# Patient Record
Sex: Female | Born: 1960 | Race: White | Hispanic: No | Marital: Married | Smoking: Current every day smoker
Health system: Southern US, Community
[De-identification: ages and names within clinical notes are randomized; demographics above are authoritative.]

## PROBLEM LIST (undated history)

## (undated) DIAGNOSIS — M549 Dorsalgia, unspecified: Secondary | ICD-10-CM

## (undated) DIAGNOSIS — C50919 Malignant neoplasm of unspecified site of unspecified female breast: Secondary | ICD-10-CM

## (undated) DIAGNOSIS — Z9889 Other specified postprocedural states: Secondary | ICD-10-CM

## (undated) DIAGNOSIS — F419 Anxiety disorder, unspecified: Secondary | ICD-10-CM

## (undated) DIAGNOSIS — J45909 Unspecified asthma, uncomplicated: Secondary | ICD-10-CM

## (undated) DIAGNOSIS — F4310 Post-traumatic stress disorder, unspecified: Secondary | ICD-10-CM

## (undated) DIAGNOSIS — Z923 Personal history of irradiation: Secondary | ICD-10-CM

## (undated) DIAGNOSIS — F329 Major depressive disorder, single episode, unspecified: Secondary | ICD-10-CM

## (undated) DIAGNOSIS — F32A Depression, unspecified: Secondary | ICD-10-CM

## (undated) DIAGNOSIS — G8929 Other chronic pain: Secondary | ICD-10-CM

## (undated) DIAGNOSIS — J449 Chronic obstructive pulmonary disease, unspecified: Secondary | ICD-10-CM

## (undated) HISTORY — PX: BREAST BIOPSY: SHX20

## (undated) HISTORY — PX: TUBAL LIGATION: SHX77

## (undated) HISTORY — PX: MASTECTOMY: SHX3

## (undated) HISTORY — PX: NEPHRECTOMY: SHX65

---
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
## 2002-11-10 DIAGNOSIS — C50919 Malignant neoplasm of unspecified site of unspecified female breast: Secondary | ICD-10-CM

## 2002-11-10 HISTORY — DX: Malignant neoplasm of unspecified site of unspecified female breast: C50.919

## 2002-11-10 HISTORY — PX: BREAST LUMPECTOMY: SHX2

## 2002-11-10 HISTORY — PX: BREAST BIOPSY: SHX20

## 2018-06-09 ENCOUNTER — Encounter: Payer: Self-pay | Admitting: Emergency Medicine

## 2018-06-09 ENCOUNTER — Other Ambulatory Visit: Payer: Self-pay

## 2018-06-09 ENCOUNTER — Emergency Department
Admission: EM | Admit: 2018-06-09 | Discharge: 2018-06-09 | Disposition: A | Discharge status: Home / Self Care | Payer: 59 | Attending: Emergency Medicine | Admitting: Emergency Medicine

## 2018-06-09 DIAGNOSIS — R21 Rash and other nonspecific skin eruption: Secondary | ICD-10-CM | POA: Diagnosis present

## 2018-06-09 DIAGNOSIS — J45909 Unspecified asthma, uncomplicated: Secondary | ICD-10-CM | POA: Insufficient documentation

## 2018-06-09 DIAGNOSIS — F17200 Nicotine dependence, unspecified, uncomplicated: Secondary | ICD-10-CM | POA: Insufficient documentation

## 2018-06-09 DIAGNOSIS — T7840XA Allergy, unspecified, initial encounter: Secondary | ICD-10-CM

## 2018-06-09 HISTORY — DX: Unspecified asthma, uncomplicated: J45.909

## 2018-06-09 MED ORDER — PREDNISONE 10 MG PO TABS: ORAL_TABLET | ORAL | 0 refills | Status: DC

## 2018-06-09 MED ORDER — EPINEPHRINE 0.3 MG/0.3ML IJ SOAJ
INTRAMUSCULAR | Status: AC
  Filled 2018-06-09: qty 0.3

## 2018-06-09 MED ORDER — FAMOTIDINE IN NACL 20-0.9 MG/50ML-% IV SOLN
20.0000 mg | Freq: Once | INTRAVENOUS | Status: AC
  Administered 2018-06-09: 20 mg via INTRAVENOUS

## 2018-06-09 MED ORDER — HYDROXYZINE HCL 25 MG PO TABS: 25.0000 mg | ORAL_TABLET | Freq: Three times a day (TID) | ORAL | 0 refills | Status: DC | PRN

## 2018-06-09 MED ORDER — METHYLPREDNISOLONE SODIUM SUCC 125 MG IJ SOLR
INTRAMUSCULAR | Status: AC
  Filled 2018-06-09: qty 2

## 2018-06-09 MED ORDER — EPINEPHRINE 0.3 MG/0.3ML IJ SOAJ
0.3000 mg | Freq: Once | INTRAMUSCULAR | Status: AC
  Administered 2018-06-09: 0.3 mg via INTRAMUSCULAR

## 2018-06-09 MED ORDER — EPINEPHRINE 0.3 MG/0.3ML IJ SOAJ: 0.3000 mg | Freq: Once | INTRAMUSCULAR | 0 refills | Status: AC

## 2018-06-09 MED ORDER — METHYLPREDNISOLONE SODIUM SUCC 125 MG IJ SOLR
125.0000 mg | Freq: Once | INTRAMUSCULAR | Status: AC
  Administered 2018-06-09: 125 mg via INTRAVENOUS

## 2018-06-09 MED ORDER — HYDROXYZINE HCL 25 MG PO TABS
ORAL_TABLET | ORAL | Status: AC
  Filled 2018-06-09: qty 1

## 2018-06-09 MED ORDER — HYDROXYZINE HCL 25 MG PO TABS
25.0000 mg | ORAL_TABLET | Freq: Once | ORAL | Status: AC
  Administered 2018-06-09: 25 mg via ORAL

## 2018-06-09 MED ORDER — FAMOTIDINE IN NACL 20-0.9 MG/50ML-% IV SOLN
INTRAVENOUS | Status: AC
  Filled 2018-06-09: qty 50

## 2018-06-09 NOTE — ED Provider Notes (Signed)
[facility] Emergency Department Provider Note  ____________________________________________   First MD Initiated Contact with Patient 06/09/18 0410     (approximate)  I have reviewed the triage vital signs and the nursing notes.   HISTORY  Chief Complaint Rash    HPI Colleen [name] is a 57 y.o. female with a history of seasonal and environmental allergies who presents for a worsening generalized pruritic rash that is been present for about 6 weeks.  She describes it as hives but it never goes away.  She seems to be getting insect bites, as does her husband, but for her it develops into a generalized rash over her entire body including her neck and face.  She has no trouble breathing although she does have some chronic asthma, at least until tonight when she did feel short of breath.  She said that the itching is so severe she cannot stand it and she feels like she is having trouble breathing and does not know what to do.  She has been trying Benadryl at home but is not been helping.  The symptoms got acutely worse over the course of the evening and nothing has been different in her routine; she has not tried any new cleaning products, beauty products, soaps or detergents, new pets, new medications, etc.  She and her husband are very careful and only use fragrance free products as it is due to some seasonal and hypersensitivities of which they are already aware.  They recently moved to [place] from [place] and she says that she sometimes had similar symptoms in [place] but they have been much worse in [place].  She has seen a primary care provider but has not yet seen an allergist or dermatologist.   She denies fever/chills, chest pain, vomiting, and abdominal pain.  She has had some shortness of breath and some nausea.  Overall she describes the symptoms as gradual in onset but acutely worse tonight, severe, nothing is helping.  Past Medical History:    Diagnosis Date  . Asthma     There are no active problems to display for this patient.   Past Surgical History:  Procedure Laterality Date  . MASTECTOMY Left   . NEPHRECTOMY Left   . TUBAL LIGATION      Prior to Admission medications   Medication Sig Start Date End Date Taking? Authorizing Provider  EPINEPHrine (EPIPEN 2-PAK) 0.3 mg/0.3 mL IJ SOAJ injection Inject 0.3 mLs (0.3 mg total) into the muscle once for 1 dose. Take for severe allergic reaction, then come immediately to the Emergency Department or call 911. 06/09/18 06/09/18  Hinda Kehr, MD  hydrOXYzine (ATARAX/VISTARIL) 25 MG tablet Take 1 tablet (25 mg total) by mouth every 8 (eight) hours as needed for itching. 06/09/18   Hinda Kehr, MD  predniSONE (DELTASONE) 10 MG tablet Take 6 tabs (60 mg) PO x 3 days, then take 4 tabs (40 mg) PO x 3 days, then take 2 tabs (20 mg) PO x 3 days, then take 1 tab (10 mg) PO x 3 days, then take 1/2 tab (5 mg) PO x 4 days. 06/09/18   Hinda Kehr, MD    Allergies Morphine and related; Penicillins; Sulfa antibiotics; and Tamoxifen  No family history on file.  Social History Social History   Tobacco Use  . Smoking status: Current Every Day Smoker  . Smokeless tobacco: Current User  Substance Use Topics  . Alcohol use: Not on file  . Drug use: Not on  file    Review of Systems Constitutional: No fever/chills Eyes: No visual changes. ENT: No sore throat. Cardiovascular: Denies chest pain. Respiratory: Mild shortness of breath. Gastrointestinal: No abdominal pain.  Nausea, no vomiting.  No diarrhea.  No constipation. Genitourinary: Negative for dysuria. Musculoskeletal: Negative for neck pain.  Negative for back pain. Integumentary: Acute on chronic generalized pruritic skin rash as described above Neurological: Negative for headaches, focal weakness or numbness.   ____________________________________________   PHYSICAL EXAM:  VITAL SIGNS: ED Triage Vitals [06/09/18  0347]  Enc Vitals Group     BP (!) 113/99     Pulse Rate 90     Resp 18     Temp 97.7 F (36.5 C)     Temp Source Oral     SpO2 94 %     Weight 59 kg (130 lb)     Height 1.524 m (5')     Head Circumference      Peak Flow      Pain Score 0     Pain Loc      Pain Edu?      Excl. in [place]?     Constitutional: Alert and oriented.  Patient is in moderate to severe distress due to the generalized pruritic rash. Eyes: Conjunctivae are normal.  Head: Atraumatic. Nose: No congestion/rhinnorhea. Mouth/Throat: Mucous membranes are moist.  Oropharynx non-erythematous.  No mucosal involvement of rash Neck: No stridor.  No meningeal signs.   Cardiovascular: Normal rate, regular rhythm. Good peripheral circulation. Grossly normal heart sounds. Respiratory: Normal respiratory effort.  No retractions. Lungs CTAB. Gastrointestinal: Soft and nontender. No distention.  Musculoskeletal: No lower extremity tenderness nor edema. No gross deformities of extremities. Neurologic:  Normal speech and language. No gross focal neurologic deficits are appreciated.  Skin:  Skin is warm, dry and intact.  She has a generalized maculopapular rash over most of her body with the exception of palms and soles.  In places it appears almost urticarial but that is mostly on her upper extremities.  She also has multiple areas that do appear to be insect bites.  These lesions are similar to what her husband has but without the generalized reaction.  She has no linear lines of bites to suggest bedbugs, and she has no predominance of bites on her fingers or hands or "crevices" to suggest scabies.  There is no evidence of cellulitis or superimposed skin infection and she has only a few obvious excoriated lesions; in general she says she tries hard not to scratch the rash. Psychiatric: Mood and affect are anxious and upset, almost tearful.   ____________________________________________   LABS (all labs ordered are listed, but only  abnormal results are displayed)  Labs Reviewed - No data to display ____________________________________________  EKG  None - EKG not ordered by ED physician ____________________________________________  RADIOLOGY   ED MD interpretation: No indication for imaging  Official radiology report(s): No results found.  ____________________________________________   PROCEDURES  Critical Care performed: Yes, see critical care procedure note(s)   Procedure(s) performed:   .Critical Care Performed by: Hinda Kehr, MD Authorized by: Hinda Kehr, MD   Critical care provider statement:    Critical care time (minutes):  30   Critical care time was exclusive of:  Separately billable procedures and treating other patients   Critical care was necessary to treat or prevent imminent or life-threatening deterioration of the following conditions: anaphylaxis.   Critical care was time spent personally by me on the following activities:  Development of treatment plan with patient or surrogate, discussions with consultants, evaluation of patient's response to treatment, examination of patient, obtaining history from patient or surrogate, ordering and performing treatments and interventions, ordering and review of laboratory studies, ordering and review of radiographic studies, pulse oximetry, re-evaluation of patient's condition and review of old charts     ____________________________________________   INITIAL IMPRESSION / ASSESSMENT AND PLAN / ED COURSE  As part of my medical decision making, I reviewed the following data within the [place] notes reviewed and incorporated    Differential diagnosis includes, but is not limited to, generalized hypersensitivity reaction to insect bites, anaphylaxis, scabies, bedbugs, food related allergy, other nonspecific allergy.  The patient is very careful about what she has in her house and uses in terms of products and  when she eats and this is been going on for an extended period of time but at no point has she come to the emergency department until tonight.  She reports that is severely worse and as we are talking she is becoming short of breath and feeling like the symptoms are getting worse.  She is almost tearful and is desperate, begging me to help.  Given the subjective shortness of breath in the setting of a generalized pruritic rash, I will treat her as anaphylaxis with EpiPen 0.3 mg intramuscular, Solu-Medrol 125 mg IV, famotidine 20 mg IV, and Atarax 25 mg by mouth (as opposed to Benadryl).  I think that anxiety and frustration out of the severity of her symptoms may be playing a component but I we will treat her aggressively to see if we can get her some relief.  She is hemodynamically stable and has no apparent GI complications from her allergic reaction.  I explained to the husband and the patient that we will monitor her for a few hours to make sure she is not having a recurrence of symptoms and to try to see if the Solu-Medrol will kick in.  I counseled them that is very important she follow-up with a dermatologist or allergist, preferably with [facility], at the next available opportunity.  She understands and agrees with the plan.  Anticipate discharge with prescriptions for a prednisone taper starting at 60 mg, EpiPen's to have at home in case she develops new or worsening anaphylactic symptoms, and Atarax.  They understand the plan.  Clinical Course as of Jun 10 703  Wed Jun 09, 2018  0703 Minimal change after EpiPen but patient has been stable for more than 3 hours.  She feels little bit better and I suspect it is because the Solu-Medrol may be starting to work.  I counseled her to follow-up as soon as possible with dermatology and gave my usual and customary return precautions.  She understands and agrees with the plan.   [CF]    Clinical Course User Index [CF] Hinda Kehr, MD     ____________________________________________  FINAL CLINICAL IMPRESSION(S) / ED DIAGNOSES  Final diagnoses:  Allergic reaction, initial encounter  Rash     MEDICATIONS GIVEN DURING THIS VISIT:  Medications  EPINEPHrine (EPI-PEN) injection 0.3 mg (0.3 mg Intramuscular Given 06/09/18 0434)  methylPREDNISolone sodium succinate (SOLU-MEDROL) 125 mg/2 mL injection 125 mg (125 mg Intravenous Given 06/09/18 0435)  famotidine (PEPCID) IVPB 20 mg premix (0 mg Intravenous Stopped 06/09/18 0504)  hydrOXYzine (ATARAX/VISTARIL) tablet 25 mg (25 mg Oral Given 06/09/18 0435)     ED Discharge Orders        Ordered  predniSONE (DELTASONE) 10 MG tablet     06/09/18 0430    hydrOXYzine (ATARAX/VISTARIL) 25 MG tablet  Every 8 hours PRN     06/09/18 0430    EPINEPHrine (EPIPEN 2-PAK) 0.3 mg/0.3 mL IJ SOAJ injection   Once     06/09/18 0430       Note:  This document was prepared using Dragon voice recognition software and may include unintentional dictation errors.    Hinda Kehr, MD 06/09/18 [phone]

## 2018-06-09 NOTE — ED Notes (Signed)
Pt uprite on stretcher in exam room with no distress noted; pt reports feeling somewhat better now with decreased itching; Dr Karma Greaser notified

## 2018-06-09 NOTE — Discharge Instructions (Signed)

## 2018-06-09 NOTE — ED Triage Notes (Addendum)
Patient ambulatory to triage with steady gait, without difficulty or distress noted; pt reports x 6wks having generalized itchy rash with no known cause; has been taken benadryl & claritin without relief

## 2018-10-11 ENCOUNTER — Emergency Department

## 2018-10-11 ENCOUNTER — Inpatient Hospital Stay
Admission: EM | Admit: 2018-10-11 | Discharge: 2018-10-14 | DRG: 871 | Disposition: A | Discharge status: Home / Self Care | Attending: Internal Medicine | Admitting: Internal Medicine

## 2018-10-11 ENCOUNTER — Encounter: Payer: Self-pay | Admitting: Emergency Medicine

## 2018-10-11 ENCOUNTER — Other Ambulatory Visit: Payer: Self-pay

## 2018-10-11 DIAGNOSIS — J440 Chronic obstructive pulmonary disease with acute lower respiratory infection: Secondary | ICD-10-CM | POA: Diagnosis present

## 2018-10-11 DIAGNOSIS — Z885 Allergy status to narcotic agent status: Secondary | ICD-10-CM | POA: Diagnosis not present

## 2018-10-11 DIAGNOSIS — J9601 Acute respiratory failure with hypoxia: Secondary | ICD-10-CM

## 2018-10-11 DIAGNOSIS — Z880 Allergy status to penicillin: Secondary | ICD-10-CM | POA: Diagnosis not present

## 2018-10-11 DIAGNOSIS — J189 Pneumonia, unspecified organism: Secondary | ICD-10-CM | POA: Diagnosis present

## 2018-10-11 DIAGNOSIS — Z888 Allergy status to other drugs, medicaments and biological substances status: Secondary | ICD-10-CM

## 2018-10-11 DIAGNOSIS — Z7951 Long term (current) use of inhaled steroids: Secondary | ICD-10-CM

## 2018-10-11 DIAGNOSIS — J9600 Acute respiratory failure, unspecified whether with hypoxia or hypercapnia: Secondary | ICD-10-CM

## 2018-10-11 DIAGNOSIS — A419 Sepsis, unspecified organism: Principal | ICD-10-CM | POA: Diagnosis present

## 2018-10-11 DIAGNOSIS — R739 Hyperglycemia, unspecified: Secondary | ICD-10-CM | POA: Diagnosis present

## 2018-10-11 DIAGNOSIS — F411 Generalized anxiety disorder: Secondary | ICD-10-CM | POA: Diagnosis present

## 2018-10-11 DIAGNOSIS — Z853 Personal history of malignant neoplasm of breast: Secondary | ICD-10-CM

## 2018-10-11 DIAGNOSIS — D649 Anemia, unspecified: Secondary | ICD-10-CM | POA: Diagnosis present

## 2018-10-11 DIAGNOSIS — F17200 Nicotine dependence, unspecified, uncomplicated: Secondary | ICD-10-CM | POA: Diagnosis present

## 2018-10-11 DIAGNOSIS — Z79899 Other long term (current) drug therapy: Secondary | ICD-10-CM | POA: Diagnosis not present

## 2018-10-11 DIAGNOSIS — J9621 Acute and chronic respiratory failure with hypoxia: Secondary | ICD-10-CM | POA: Diagnosis present

## 2018-10-11 DIAGNOSIS — J449 Chronic obstructive pulmonary disease, unspecified: Secondary | ICD-10-CM | POA: Diagnosis present

## 2018-10-11 DIAGNOSIS — G629 Polyneuropathy, unspecified: Secondary | ICD-10-CM | POA: Diagnosis present

## 2018-10-11 DIAGNOSIS — J441 Chronic obstructive pulmonary disease with (acute) exacerbation: Secondary | ICD-10-CM | POA: Diagnosis present

## 2018-10-11 DIAGNOSIS — Z882 Allergy status to sulfonamides status: Secondary | ICD-10-CM

## 2018-10-11 DIAGNOSIS — R6520 Severe sepsis without septic shock: Secondary | ICD-10-CM

## 2018-10-11 HISTORY — DX: Chronic obstructive pulmonary disease, unspecified: J44.9

## 2018-10-11 HISTORY — DX: Malignant neoplasm of unspecified site of unspecified female breast: C50.919

## 2018-10-11 LAB — CBC WITH DIFFERENTIAL/PLATELET
Abs Immature Granulocytes: 0.06 10*3/uL (ref 0.00–0.07)
BASOS PCT: 1 %
Basophils Absolute: 0.1 10*3/uL (ref 0.0–0.1)
EOS ABS: 0 10*3/uL (ref 0.0–0.5)
Eosinophils Relative: 0 %
HCT: 38.4 % (ref 36.0–46.0)
Hemoglobin: 12.9 g/dL (ref 12.0–15.0)
Immature Granulocytes: 1 %
Lymphocytes Relative: 8 %
Lymphs Abs: 1 10*3/uL (ref 0.7–4.0)
MCH: 30.7 pg (ref 26.0–34.0)
MCHC: 33.6 g/dL (ref 30.0–36.0)
MCV: 91.4 fL (ref 80.0–100.0)
MONO ABS: 1.2 10*3/uL — AB (ref 0.1–1.0)
MONOS PCT: 10 %
NEUTROS PCT: 80 %
Neutro Abs: 9.9 10*3/uL — ABNORMAL HIGH (ref 1.7–7.7)
PLATELETS: 233 10*3/uL (ref 150–400)
RBC: 4.2 MIL/uL (ref 3.87–5.11)
RDW: 13.2 % (ref 11.5–15.5)
WBC: 12.3 10*3/uL — ABNORMAL HIGH (ref 4.0–10.5)
nRBC: 0 % (ref 0.0–0.2)

## 2018-10-11 LAB — INFLUENZA PANEL BY PCR (TYPE A & B)
INFLBPCR: NEGATIVE
Influenza A By PCR: NEGATIVE

## 2018-10-11 LAB — COMPREHENSIVE METABOLIC PANEL
ALT: 14 U/L (ref 0–44)
ANION GAP: 10 (ref 5–15)
AST: 20 U/L (ref 15–41)
Albumin: 3.7 g/dL (ref 3.5–5.0)
Alkaline Phosphatase: 57 U/L (ref 38–126)
BUN: 9 mg/dL (ref 6–20)
CALCIUM: 8.4 mg/dL — AB (ref 8.9–10.3)
CHLORIDE: 99 mmol/L (ref 98–111)
CO2: 24 mmol/L (ref 22–32)
Creatinine, Ser: 0.59 mg/dL (ref 0.44–1.00)
Glucose, Bld: 142 mg/dL — ABNORMAL HIGH (ref 70–99)
Potassium: 3.8 mmol/L (ref 3.5–5.1)
SODIUM: 133 mmol/L — AB (ref 135–145)
Total Bilirubin: 0.6 mg/dL (ref 0.3–1.2)
Total Protein: 7.3 g/dL (ref 6.5–8.1)

## 2018-10-11 LAB — BLOOD GAS, VENOUS
Acid-Base Excess: 3.6 mmol/L — ABNORMAL HIGH (ref 0.0–2.0)
BICARBONATE: 26.5 mmol/L (ref 20.0–28.0)
DELIVERY SYSTEMS: POSITIVE
FIO2: 0.32
O2 SAT: 82.5 %
PO2 VEN: 42 mmHg (ref 32.0–45.0)
Patient temperature: 37
pCO2, Ven: 34 mmHg — ABNORMAL LOW (ref 44.0–60.0)
pH, Ven: 7.5 — ABNORMAL HIGH (ref 7.250–7.430)

## 2018-10-11 LAB — CG4 I-STAT (LACTIC ACID): Lactic Acid, Venous: 0.78 mmol/L (ref 0.5–1.9)

## 2018-10-11 LAB — TROPONIN I

## 2018-10-11 IMAGING — DX DG CHEST 1V PORT
1 series · 1 of 1 positions shown · non-contrast
Comparison: None.

CLINICAL DATA: Shortness of breath

EXAM:
PORTABLE CHEST 1 VIEW

[chest ap]
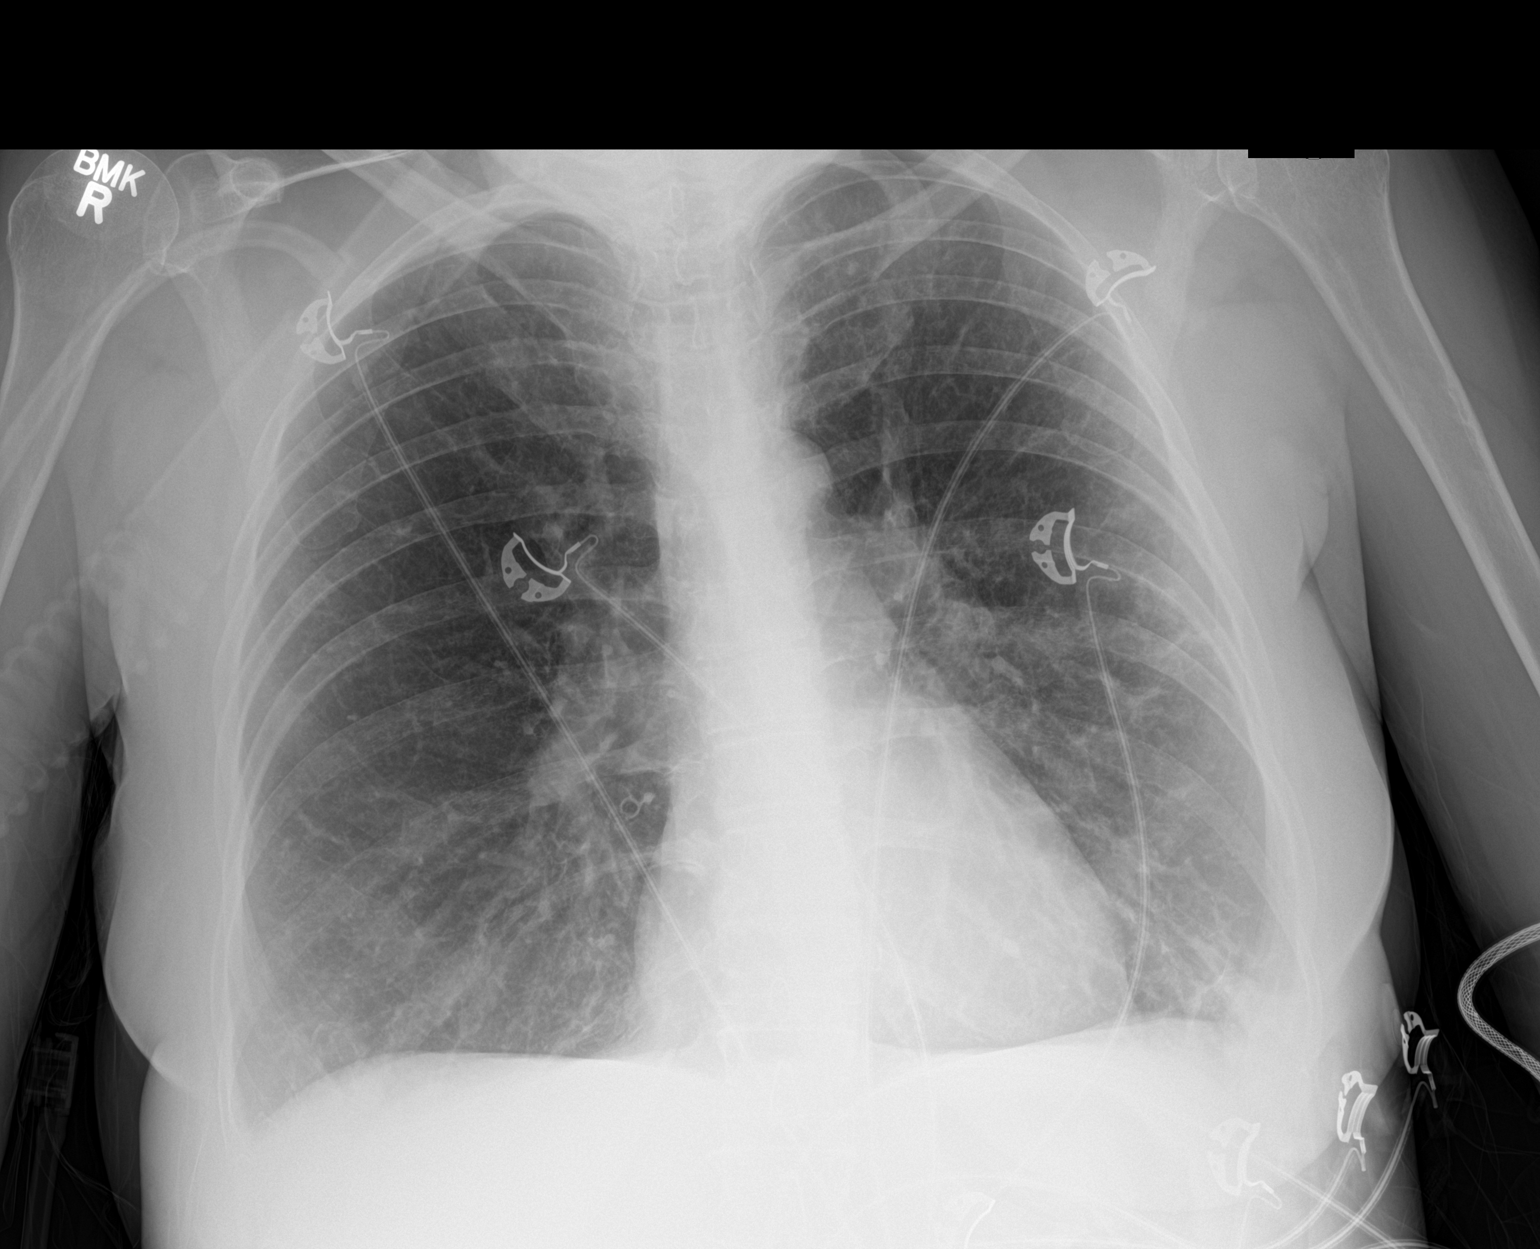

[1 of 1 positions shown; findings below may reference images not displayed]

FINDINGS: There is hyperinflation of the lungs compatible with COPD. Airspace
disease in the left lower lobe could reflect atelectasis or
pneumonia. Heart is normal size. No acute bony abnormality.
IMPRESSION: COPD.  Left basilar/lingular atelectasis or pneumonia.

## 2018-10-11 MED ORDER — SODIUM CHLORIDE 0.9 % IV BOLUS
1000.0000 mL | Freq: Once | INTRAVENOUS | Status: AC
  Administered 2018-10-11: 1000 mL via INTRAVENOUS

## 2018-10-11 MED ORDER — SODIUM CHLORIDE 0.9 % IV SOLN
1.0000 g | Freq: Once | INTRAVENOUS | Status: AC
  Administered 2018-10-11: 1 g via INTRAVENOUS
  Filled 2018-10-11: qty 10

## 2018-10-11 MED ORDER — MAGNESIUM SULFATE 2 GM/50ML IV SOLN
2.0000 g | Freq: Once | INTRAVENOUS | Status: AC
  Administered 2018-10-11: 2 g via INTRAVENOUS
  Filled 2018-10-11: qty 50

## 2018-10-11 MED ORDER — MAGNESIUM SULFATE 2 GM/50ML IV SOLN
INTRAVENOUS | Status: AC
  Administered 2018-10-11: 2 g via INTRAVENOUS
  Filled 2018-10-11: qty 50

## 2018-10-11 MED ORDER — IPRATROPIUM-ALBUTEROL 0.5-2.5 (3) MG/3ML IN SOLN
RESPIRATORY_TRACT | Status: AC
  Administered 2018-10-11: 3 mL via RESPIRATORY_TRACT
  Filled 2018-10-11: qty 9

## 2018-10-11 MED ORDER — IPRATROPIUM-ALBUTEROL 0.5-2.5 (3) MG/3ML IN SOLN
3.0000 mL | Freq: Once | RESPIRATORY_TRACT | Status: AC
  Administered 2018-10-11: 3 mL via RESPIRATORY_TRACT

## 2018-10-11 MED ORDER — METHYLPREDNISOLONE SODIUM SUCC 125 MG IJ SOLR
INTRAMUSCULAR | Status: AC
  Administered 2018-10-11: 125 mg via INTRAVENOUS
  Filled 2018-10-11: qty 2

## 2018-10-11 MED ORDER — DOXYCYCLINE HYCLATE 100 MG PO TABS
100.0000 mg | ORAL_TABLET | Freq: Once | ORAL | Status: AC
  Administered 2018-10-11: 100 mg via ORAL
  Filled 2018-10-11: qty 1

## 2018-10-11 MED ORDER — METHYLPREDNISOLONE SODIUM SUCC 125 MG IJ SOLR
125.0000 mg | Freq: Once | INTRAMUSCULAR | Status: AC
  Administered 2018-10-11: 125 mg via INTRAVENOUS

## 2018-10-11 NOTE — ED Provider Notes (Signed)
[facility] Emergency Department Provider Note  ____________________________________________  Time seen: Approximately 8:30 PM  I have reviewed the triage vital signs and the nursing notes.   HISTORY  Chief Complaint Shortness of Breath   HPI Colleen [name] is a 57 y.o. female with a history of asthma and COPD who presents for evaluation of shortness of breath.  Patient reports 4 days of productive cough, fever, and progressively worsening shortness of breath.  Shortness of breath became constant and severe earlier today.  Patient is a smoker.  Reports several family members had similar symptoms.  Last fever was this morning.  No leg pain or swelling, no history of PE or DVT, no hemoptysis or exogenous hormones.  Patient denies chest pain, diarrhea, nausea, vomiting, or abdominal pain.   Past Medical History:  Diagnosis Date  . Asthma   . Breast cancer ([place])   . COPD (chronic obstructive pulmonary disease) ([facility])     There are no active problems to display for this patient.   Past Surgical History:  Procedure Laterality Date  . MASTECTOMY Left   . MASTECTOMY    . NEPHRECTOMY Left   . TUBAL LIGATION      Prior to Admission medications   Medication Sig Start Date End Date Taking? Authorizing Provider  DULoxetine (CYMBALTA) 60 MG capsule Take 60 mg by mouth daily. 10/07/18  Yes [provider]  VENTOLIN HFA 108 (90 Base) MCG/ACT inhaler Inhale 1-2 puffs into the lungs every 4 (four) hours as needed for cough or wheezing. 10/02/18  Yes [provider]  hydrOXYzine (ATARAX/VISTARIL) 25 MG tablet Take 1 tablet (25 mg total) by mouth every 8 (eight) hours as needed for itching. 06/09/18   Hinda Kehr, MD  predniSONE (DELTASONE) 10 MG tablet Take 6 tabs (60 mg) PO x 3 days, then take 4 tabs (40 mg) PO x 3 days, then take 2 tabs (20 mg) PO x 3 days, then take 1 tab (10 mg) PO x 3 days, then take 1/2 tab (5 mg) PO x 4 days. 06/09/18    Hinda Kehr, MD    Allergies Morphine and related; Penicillins; Sulfa antibiotics; and Tamoxifen  History reviewed. No pertinent family history.  Social History Social History   Tobacco Use  . Smoking status: Current Every Day Smoker  . Smokeless tobacco: Current User  Substance Use Topics  . Alcohol use: Not on file  . Drug use: Not on file    Review of Systems  Constitutional: + fever. Eyes: Negative for visual changes. ENT: Negative for sore throat. Neck: No neck pain  Cardiovascular: Negative for chest pain. Respiratory: + shortness of breath, cough Gastrointestinal: Negative for abdominal pain, vomiting or diarrhea. Genitourinary: Negative for dysuria. Musculoskeletal: Negative for back pain. Skin: Negative for rash. Neurological: Negative for headaches, weakness or numbness. Psych: No SI or HI  ____________________________________________   PHYSICAL EXAM:  VITAL SIGNS: ED Triage Vitals  Enc Vitals Group     BP --      Pulse Rate 10/11/18 2013 (!) 120     Resp 10/11/18 2013 (!) 27     Temp 10/11/18 2028 99.3 F (37.4 C)     Temp Source 10/11/18 2028 Oral     SpO2 10/11/18 2013 (!) 86 %     Weight 10/11/18 2016 130 lb 1.1 oz (59 kg)     Height 10/11/18 2016 5' (1.524 m)     Head Circumference --      Peak Flow --  Pain Score 10/11/18 2016 0     Pain Loc --      Pain Edu? --      Excl. in [place]? --     Constitutional: Alert and oriented, severe respiratory distress.  HEENT:      Head: Normocephalic and atraumatic.         Eyes: Conjunctivae are normal. Sclera is non-icteric.       Mouth/Throat: Mucous membranes are moist.       Neck: Supple with no signs of meningismus. Cardiovascular: Tachycardic with regular rhythm. No murmurs, gallops, or rubs. 2+ symmetrical distal pulses are present in all extremities. No JVD. Respiratory: Severe respiratory distress, accessory muscles of respiration, tripoding, hypoxic to 86% room air, severely diminished  air movement bilaterally with faint wheezes  Gastrointestinal: Soft, non tender, and non distended with positive bowel sounds. No rebound or guarding. Musculoskeletal: Nontender with normal range of motion in all extremities. No edema, cyanosis, or erythema of extremities. Neurologic: Normal speech and language. Face is symmetric. Moving all extremities. No gross focal neurologic deficits are appreciated. Skin: Skin is warm, dry and intact. No rash noted. Psychiatric: Mood and affect are normal. Speech and behavior are normal.  ____________________________________________   LABS (all labs ordered are listed, but only abnormal results are displayed)  Labs Reviewed  CBC WITH DIFFERENTIAL/PLATELET - Abnormal; Notable for the following components:      Result Value   WBC 12.3 (*)    Neutro Abs 9.9 (*)    Monocytes Absolute 1.2 (*)    All other components within normal limits  COMPREHENSIVE METABOLIC PANEL - Abnormal; Notable for the following components:   Sodium 133 (*)    Glucose, Bld 142 (*)    Calcium 8.4 (*)    All other components within normal limits  BLOOD GAS, VENOUS - Abnormal; Notable for the following components:   pH, Ven 7.50 (*)    pCO2, Ven 34 (*)    Acid-Base Excess 3.6 (*)    All other components within normal limits  CULTURE, BLOOD (ROUTINE X 2)  CULTURE, BLOOD (ROUTINE X 2)  TROPONIN I  INFLUENZA PANEL BY PCR (TYPE A & B)  URINALYSIS, ROUTINE W REFLEX MICROSCOPIC  I-STAT CG4 LACTIC ACID, ED  CG4 I-STAT (LACTIC ACID)   ____________________________________________  EKG  ED ECG REPORT I, Rudene Re, the attending physician, personally viewed and interpreted this ECG.  Sinus tachycardia, rate of 115, normal intervals, right axis deviation, ST depressions on inferior leads with no ST elevation.  No prior for comparison. ____________________________________________  RADIOLOGY  I have personally reviewed the images performed during this visit and I  agree with the Radiologist's read.   Interpretation by Radiologist:  Dg Chest Portable 1 View  Result Date: 10/11/2018 CLINICAL DATA:  Shortness of breath EXAM: PORTABLE CHEST 1 VIEW COMPARISON:  None. FINDINGS: There is hyperinflation of the lungs compatible with COPD. Airspace disease in the left lower lobe could reflect atelectasis or pneumonia. Heart is normal size. No acute bony abnormality. IMPRESSION: COPD.  Left basilar/lingular atelectasis or pneumonia. Electronically Signed   By: Rolm Baptise M.D.   On: 10/11/2018 21:05      ____________________________________________   PROCEDURES  Procedure(s) performed: None Procedures Critical Care performed: yes  CRITICAL CARE Performed by: Rudene Re  ?  Total critical care time: 40 min  Critical care time was exclusive of separately billable procedures and treating other patients.  Critical care was necessary to treat or prevent imminent or life-threatening deterioration.  Critical care was time spent personally by me on the following activities: development of treatment plan with patient and/or surrogate as well as nursing, discussions with consultants, evaluation of patient's response to treatment, examination of patient, obtaining history from patient or surrogate, ordering and performing treatments and interventions, ordering and review of laboratory studies, ordering and review of radiographic studies, pulse oximetry and re-evaluation of patient's condition.  ____________________________________________   INITIAL IMPRESSION / ASSESSMENT AND PLAN / ED COURSE   57 y.o. female with a history of asthma and COPD who presents for evaluation of shortness of breath, cough, and fever x 4 days.  Patient arrives in severe respiratory distress, hypoxic, tripoding, severely diminished air movement bilaterally with faint expiratory wheezes concerning for COPD exacerbation in the setting of viral URI versus flu versus pneumonia.   Patient was started on sepsis protocol.  Labs are pending.  EKG showing ST depressions with no prior for comparison.  Troponin is pending.  Patient was started on BiPAP, IV fluids, DuoNeb, magnesium, Solu-Medrol, Rocephin and doxycycline.    _________________________ 9:35 PM on 10/11/2018 -----------------------------------------  Chest x-ray concerning for pneumonia. Patient with leukocytosis and a white count of 12.3, normal lactic acid.  Patient looks much more comfortable on BiPAP.  Discussed with Dr. Jannifer Franklin for admission.   As part of my medical decision making, I reviewed the following data within the [place] notes reviewed and incorporated, Labs reviewed , EKG interpreted , Radiograph reviewed , Discussed with admitting physician , Notes from prior ED visits and  Controlled Substance Database    Pertinent labs & imaging results that were available during my care of the patient were reviewed by me and considered in my medical decision making (see chart for details).    ____________________________________________   FINAL CLINICAL IMPRESSION(S) / ED DIAGNOSES  Final diagnoses:  Community acquired pneumonia, unspecified laterality  Acute respiratory failure with hypoxia ([place])  Sepsis with acute hypoxic respiratory failure without septic shock, due to unspecified organism [facility])      NEW MEDICATIONS STARTED DURING THIS VISIT:  ED Discharge Orders    None       Note:  This document was prepared using Dragon voice recognition software and may include unintentional dictation errors.    Rudene Re, MD 10/11/18 2136

## 2018-10-11 NOTE — H&P (Signed)
[facility] NAME: Colleen [name]    MR#:  [id number]  DATE OF BIRTH:  1961-10-24  DATE OF ADMISSION:  10/11/2018  PRIMARY CARE PHYSICIAN: Gae Bon, NP   REQUESTING/REFERRING PHYSICIAN: Alfred Levins, MD  CHIEF COMPLAINT:   Chief Complaint  Patient presents with  . Shortness of Breath    HISTORY OF PRESENT ILLNESS:  Colleen [name]  is a 57 y.o. female who presents with chief complaint as above.  Patient presents with several days increasing cough, intermittent fevers, shortness of breath.  Here in the ED she is found to meet sepsis criteria with fever, leukocytosis, and tachycardia.  She is found to have pneumonia.  Work of breathing required BiPAP.  Hospitalist were called for admission  PAST MEDICAL HISTORY:   Past Medical History:  Diagnosis Date  . Asthma   . Breast cancer ([place])   . COPD (chronic obstructive pulmonary disease) ([place])      PAST SURGICAL HISTORY:   Past Surgical History:  Procedure Laterality Date  . MASTECTOMY Left   . MASTECTOMY    . NEPHRECTOMY Left   . TUBAL LIGATION       SOCIAL HISTORY:   Social History   Tobacco Use  . Smoking status: Current Every Day Smoker  . Smokeless tobacco: Current User  Substance Use Topics  . Alcohol use: Not Currently     FAMILY HISTORY:  Family history reviewed and is non-contributory   DRUG ALLERGIES:   Allergies  Allergen Reactions  . Morphine And Related Anaphylaxis  . Penicillins Anaphylaxis    Has patient had a PCN reaction causing immediate rash, facial/tongue/throat swelling, SOB or lightheadedness with hypotension: Yes Has patient had a PCN reaction causing severe rash involving mucus membranes or skin necrosis: No Has patient had a PCN reaction that required hospitalization: Unknown Has patient had a PCN reaction occurring within the last 10 years: No If all of the above answers are "NO", then may proceed with Cephalosporin  use.   . Tamoxifen Anaphylaxis  . Hydroxyzine Itching  . Sulfa Antibiotics Itching    MEDICATIONS AT HOME:   Prior to Admission medications   Medication Sig Start Date End Date Taking? Authorizing Provider  BREO ELLIPTA 100-25 MCG/INH AEPB Inhale 1 puff into the lungs daily as needed. 07/21/18  Yes [provider]  cetirizine-pseudoephedrine (ZYRTEC-D) 5-120 MG tablet Take 1 tablet by mouth 2 (two) times daily as needed for allergies.   Yes [provider]  clonazePAM (KLONOPIN) 0.5 MG disintegrating tablet Take 0.5 mg by mouth every evening. 07/21/18  Yes [provider]  loratadine (CLARITIN) 10 MG tablet Take 10 mg by mouth daily. 07/06/18  Yes [provider]  pregabalin (LYRICA) 75 MG capsule Take 75 mg by mouth 2 (two) times daily.   Yes [provider]  VENTOLIN HFA 108 (90 Base) MCG/ACT inhaler Inhale 1-2 puffs into the lungs every 4 (four) hours as needed for cough or wheezing. 10/02/18  Yes [provider]  hydrOXYzine (ATARAX/VISTARIL) 25 MG tablet Take 1 tablet (25 mg total) by mouth every 8 (eight) hours as needed for itching. Patient not taking: Reported on 10/11/2018 06/09/18   Hinda Kehr, MD  predniSONE (DELTASONE) 10 MG tablet Take 6 tabs (60 mg) PO x 3 days, then take 4 tabs (40 mg) PO x 3 days, then take 2 tabs (20 mg) PO x 3 days, then take 1 tab (10 mg) PO x 3 days, then  take 1/2 tab (5 mg) PO x 4 days. Patient not taking: Reported on 10/11/2018 06/09/18   Hinda Kehr, MD    REVIEW OF SYSTEMS:  Review of Systems  Constitutional: Positive for fever. Negative for chills, malaise/fatigue and weight loss.  HENT: Negative for ear pain, hearing loss and tinnitus.   Eyes: Negative for blurred vision, double vision, pain and redness.  Respiratory: Positive for cough and shortness of breath. Negative for hemoptysis.   Cardiovascular: Negative for chest pain, palpitations, orthopnea and leg swelling.  Gastrointestinal:  Negative for abdominal pain, constipation, diarrhea, nausea and vomiting.  Genitourinary: Negative for dysuria, frequency and hematuria.  Musculoskeletal: Negative for back pain, joint pain and neck pain.  Skin:       No acne, rash, or lesions  Neurological: Negative for dizziness, tremors, focal weakness and weakness.  Endo/Heme/Allergies: Negative for polydipsia. Does not bruise/bleed easily.  Psychiatric/Behavioral: Negative for depression. The patient is not nervous/anxious and does not have insomnia.      VITAL SIGNS:   Vitals:   10/11/18 2028 10/11/18 2030 10/11/18 2130 10/11/18 2200  BP:  104/72 109/64 102/65  Pulse: (!) 115 (!) 110 (!) 108 (!) 106  Resp: 18 20 (!) 26 (!) 30  Temp: 99.3 F (37.4 C)     TempSrc: Oral     SpO2: 92% 93% 100% 100%  Weight:      Height:       Wt Readings from Last 3 Encounters:  10/11/18 59 kg  06/09/18 59 kg    PHYSICAL EXAMINATION:  Physical Exam  Vitals reviewed. Constitutional: She is oriented to person, place, and time. She appears well-developed and well-nourished.  HENT:  Head: Normocephalic and atraumatic.  Mouth/Throat: Oropharynx is clear and moist.  Eyes: Pupils are equal, round, and reactive to light. Conjunctivae and EOM are normal. No scleral icterus.  Neck: Normal range of motion. Neck supple. No JVD present. No thyromegaly present.  Cardiovascular: Normal rate, regular rhythm and intact distal pulses. Exam reveals no gallop and no friction rub.  No murmur heard. Respiratory: She is in respiratory distress. She has no wheezes. She has no rales.  Left greater than right coarse breath sounds  GI: Soft. Bowel sounds are normal. She exhibits no distension. There is no tenderness.  Musculoskeletal: Normal range of motion. She exhibits no edema.  No arthritis, no gout  Lymphadenopathy:    She has no cervical adenopathy.  Neurological: She is alert and oriented to person, place, and time. No cranial nerve deficit.  No  dysarthria, no aphasia  Skin: Skin is warm and dry. No rash noted. No erythema.  Psychiatric: She has a normal mood and affect. Her behavior is normal. Judgment and thought content normal.    LABORATORY PANEL:   CBC Recent Labs  Lab 10/11/18 2036  WBC 12.3*  HGB 12.9  HCT 38.4  PLT 233   ------------------------------------------------------------------------------------------------------------------  Chemistries  Recent Labs  Lab 10/11/18 2036  NA 133*  K 3.8  CL 99  CO2 24  GLUCOSE 142*  BUN 9  CREATININE 0.59  CALCIUM 8.4*  AST 20  ALT 14  ALKPHOS 57  BILITOT 0.6   ------------------------------------------------------------------------------------------------------------------  Cardiac Enzymes Recent Labs  Lab 10/11/18 2036  TROPONINI <0.03   ------------------------------------------------------------------------------------------------------------------  RADIOLOGY:  Dg Chest Portable 1 View  Result Date: 10/11/2018 CLINICAL DATA:  Shortness of breath EXAM: PORTABLE CHEST 1 VIEW COMPARISON:  None. FINDINGS: There is hyperinflation of the lungs compatible with COPD. Airspace disease in the left  lower lobe could reflect atelectasis or pneumonia. Heart is normal size. No acute bony abnormality. IMPRESSION: COPD.  Left basilar/lingular atelectasis or pneumonia. Electronically Signed   By: Rolm Baptise M.D.   On: 10/11/2018 21:05    EKG:   Orders placed or performed during the hospital encounter of 10/11/18  . ED EKG  . ED EKG    IMPRESSION AND PLAN:  Principal Problem:   Sepsis ([place]) -IV antibiotics given, lactic acid was within normal limits, blood pressure is stable, cultures sent Active Problems:   CAP (community acquired pneumonia) -antibiotics as above, currently on BiPAP for work of breathing, duo nebs, PRN antitussive and other supportive treatment   COPD (chronic obstructive pulmonary disease) ([place]) -continue home dose inhalers   GAD  (generalized anxiety disorder) -home dose anxiolytics  Chart review performed and case discussed with ED provider. Labs, imaging and/or ECG reviewed by provider and discussed with patient/family. Management plans discussed with the patient and/or family.  DVT PROPHYLAXIS: SubQ lovenox   GI PROPHYLAXIS:  None  ADMISSION STATUS: Inpatient     CODE STATUS: Full  TOTAL TIME TAKING CARE OF THIS PATIENT: 45 minutes.   Larico Dimock [place] 10/11/2018, 11:00 PM  [employer]  [phone]  CC: Primary care physician; Gae Bon, NP  Note:  This document was prepared using Dragon voice recognition software and may include unintentional dictation errors.

## 2018-10-11 NOTE — Consult Note (Signed)
Name: Colleen [name] MRN: [id number] DOB: 05/06/1961    ADMISSION DATE:  10/11/2018 CONSULTATION DATE: 10/11/2018   REFERRING MD : Dr. Jannifer Franklin   CHIEF COMPLAINT: Shortness of Breath   BRIEF PATIENT DESCRIPTION:  57 yo female admitted with sepsis and acute on chronic hypoxic respiratory failure secondary to AECOPD and possible pneumonia requiring Bipap   SIGNIFICANT EVENTS  12/2-Pt admitted to the stepdown unit on Bipap   HISTORY OF PRESENT ILLNESS:   This is a 57 yo female with a PMH of COPD, Breast Cancer, Current Everyday Smoker, and Asthma.  She presented to [facility] ER on 12/2 with c/o worsening shortness of breath, productive cough, and fever onset of symptoms 4 days prior to presentation.  Upon arrival to the ER she was in severe respiratory distress in tripod position, and hypoxic with faint expiratory wheezes requiring Bipap.  CXR concerning for LLL pneumonia vs. atelectasis.  Lab results revealed Na+ 133, glucose 142, troponin <0.03, lactic acid 0.78, wbc 12.3, vbg pH 7.50/pCO2 34, and influenza pcr negative.  She received duonebs x3, 2g iv magnesium, ceftriaxone, doxycycline, solumedrol, and 2L NS bolus.  She was subsequently admitted to the stepdown unit for additional workup and treatment.   PAST MEDICAL HISTORY :   has a past medical history of Asthma, Breast cancer ([place]), and COPD (chronic obstructive pulmonary disease) ([place]).  has a past surgical history that includes Nephrectomy (Left); Mastectomy (Left); Tubal ligation; and Mastectomy. Prior to Admission medications   Medication Sig Start Date End Date Taking? Authorizing Provider  BREO ELLIPTA 100-25 MCG/INH AEPB Inhale 1 puff into the lungs daily as needed. 07/21/18  Yes [provider]  cetirizine-pseudoephedrine (ZYRTEC-D) 5-120 MG tablet Take 1 tablet by mouth 2 (two) times daily as needed for allergies.   Yes [provider]  clonazePAM (KLONOPIN) 0.5 MG disintegrating tablet Take 0.5 mg by mouth every  evening. 07/21/18  Yes [provider]  loratadine (CLARITIN) 10 MG tablet Take 10 mg by mouth daily. 07/06/18  Yes [provider]  pregabalin (LYRICA) 75 MG capsule Take 75 mg by mouth 2 (two) times daily.   Yes [provider]  VENTOLIN HFA 108 (90 Base) MCG/ACT inhaler Inhale 1-2 puffs into the lungs every 4 (four) hours as needed for cough or wheezing. 10/02/18  Yes [provider]  hydrOXYzine (ATARAX/VISTARIL) 25 MG tablet Take 1 tablet (25 mg total) by mouth every 8 (eight) hours as needed for itching. Patient not taking: Reported on 10/11/2018 06/09/18   Hinda Kehr, MD  predniSONE (DELTASONE) 10 MG tablet Take 6 tabs (60 mg) PO x 3 days, then take 4 tabs (40 mg) PO x 3 days, then take 2 tabs (20 mg) PO x 3 days, then take 1 tab (10 mg) PO x 3 days, then take 1/2 tab (5 mg) PO x 4 days. Patient not taking: Reported on 10/11/2018 06/09/18   Hinda Kehr, MD   Allergies  Allergen Reactions  . Morphine And Related Anaphylaxis  . Penicillins Anaphylaxis    Has patient had a PCN reaction causing immediate rash, facial/tongue/throat swelling, SOB or lightheadedness with hypotension: Yes Has patient had a PCN reaction causing severe rash involving mucus membranes or skin necrosis: No Has patient had a PCN reaction that required hospitalization: Unknown Has patient had a PCN reaction occurring within the last 10 years: No If all of the above answers are "NO", then may proceed with Cephalosporin use.   . Tamoxifen Anaphylaxis  . Hydroxyzine Itching  .  Sulfa Antibiotics Itching    FAMILY HISTORY:  family history is not on file. SOCIAL HISTORY:  reports that she has been smoking. She uses smokeless tobacco. She reports that she drank alcohol.  REVIEW OF SYSTEMS: Positives in BOLD  Constitutional: fever, chills, weight loss, malaise/fatigue and diaphoresis.  HENT: Negative for hearing loss, ear pain, nosebleeds, congestion, sore throat, neck pain, tinnitus  and ear discharge.   Eyes: Negative for blurred vision, double vision, photophobia, pain, discharge and redness.  Respiratory: cough, hemoptysis, sputum production, shortness of breath, wheezing and stridor.   Cardiovascular: Negative for chest pain, palpitations, orthopnea, claudication, leg swelling and PND.  Gastrointestinal: Negative for heartburn, nausea, vomiting, abdominal pain, diarrhea, constipation, blood in stool and melena.  Genitourinary: Negative for dysuria, urgency, frequency, hematuria and flank pain.  Musculoskeletal: Negative for myalgias, back pain, joint pain and falls.  Skin: Negative for itching and rash.  Neurological: Negative for dizziness, tingling, tremors, sensory change, speech change, focal weakness, seizures, loss of consciousness, weakness and headaches.  Endo/Heme/Allergies: Negative for environmental allergies and polydipsia. Does not bruise/bleed easily.  SUBJECTIVE:  No complaints at this time  VITAL SIGNS: Temp:  [99.3 F (37.4 C)] 99.3 F (37.4 C) (12/02 2028) Pulse Rate:  [106-120] 106 (12/02 2200) Resp:  [18-30] 30 (12/02 2200) BP: (102-109)/(64-72) 102/65 (12/02 2200) SpO2:  [86 %-100 %] 100 % (12/02 2200) Weight:  [59 kg] 59 kg (12/02 2016)  PHYSICAL EXAMINATION: General: well developed, well nourished female, NAD on Bipap  Neuro: alert and oriented, follows commands  HEENT: supple, no JVD  Cardiovascular: nsr, rrr, no R/G  Lungs: diminished throughout, even, non labored  Abdomen: +BS x4, soft, obese, non tender, non distended  Musculoskeletal: normal bulk and tone, no edema Skin: intact no rashes or lesions   Recent Labs  Lab 10/11/18 2036  NA 133*  K 3.8  CL 99  CO2 24  BUN 9  CREATININE 0.59  GLUCOSE 142*   Recent Labs  Lab 10/11/18 2036  HGB 12.9  HCT 38.4  WBC 12.3*  PLT 233   Dg Chest Portable 1 View  Result Date: 10/11/2018 CLINICAL DATA:  Shortness of breath EXAM: PORTABLE CHEST 1 VIEW COMPARISON:  None.  FINDINGS: There is hyperinflation of the lungs compatible with COPD. Airspace disease in the left lower lobe could reflect atelectasis or pneumonia. Heart is normal size. No acute bony abnormality. IMPRESSION: COPD.  Left basilar/lingular atelectasis or pneumonia. Electronically Signed   By: Rolm Baptise M.D.   On: 10/11/2018 21:05    ASSESSMENT / PLAN:  Acute on chronic hypoxic respiratory failure secondary to possible pneumonia and AECOPD Hx: Asthma and Current everyday smoker   Supplemental O2 for dyspnea and/or hypoxia  Prn bronchodilator therapy Continue breo ellipta  Trend WBC and monitor fever curve Trend PCT  Follow cultures Continue abx  Smoking cessation counseling provided  Maintain map >65  VTE px: subq lovenox   Marda Stalker, [place] Pager [phone] (please enter 7 digits) PCCM Consult Pager [phone] (please enter 7 digits)

## 2018-10-11 NOTE — ED Notes (Signed)
Pt placed on Bipap by Respiratory at this time.

## 2018-10-11 NOTE — ED Triage Notes (Signed)
Pt here for [facility]. Labored, pursed lip breathing. Minimal air movement heard. Hypoxia in triage. Wears O2 at night but not during day.

## 2018-10-11 NOTE — Progress Notes (Signed)
CODE SEPSIS - PHARMACY COMMUNICATION  **Broad Spectrum Antibiotics should be administered within 1 hour of Sepsis diagnosis**  Time Code Sepsis Called/Page Received: 12/2 @ 21:30   Antibiotics Ordered:  Ceftriaxone , doxycycline   Time of 1st antibiotic administration:  20:53   Additional action taken by pharmacy: none   If necessary, Name of Provider/Nurse Contacted:     Huxley Vanwagoner D ,PharmD Clinical Pharmacist  10/11/2018  9:48 PM

## 2018-10-11 NOTE — ED Notes (Signed)
Assisted pt onto bedpan with no success. Pt states her back hurts too much to use the bedpan. Unable to ambulate pt to restroom due to pt being on Bipap.

## 2018-10-12 ENCOUNTER — Other Ambulatory Visit: Payer: Self-pay

## 2018-10-12 DIAGNOSIS — J9601 Acute respiratory failure with hypoxia: Secondary | ICD-10-CM | POA: Diagnosis not present

## 2018-10-12 DIAGNOSIS — J9621 Acute and chronic respiratory failure with hypoxia: Secondary | ICD-10-CM

## 2018-10-12 DIAGNOSIS — J9600 Acute respiratory failure, unspecified whether with hypoxia or hypercapnia: Secondary | ICD-10-CM

## 2018-10-12 LAB — URINALYSIS, ROUTINE W REFLEX MICROSCOPIC
Bilirubin Urine: NEGATIVE
Glucose, UA: 50 mg/dL — AB
Hgb urine dipstick: NEGATIVE
Ketones, ur: 20 mg/dL — AB
Nitrite: NEGATIVE
Protein, ur: NEGATIVE mg/dL
Specific Gravity, Urine: 1.015 (ref 1.005–1.030)
pH: 6 (ref 5.0–8.0)

## 2018-10-12 LAB — BASIC METABOLIC PANEL
Anion gap: 8 (ref 5–15)
BUN: 7 mg/dL (ref 6–20)
CO2: 22 mmol/L (ref 22–32)
Calcium: 7.4 mg/dL — ABNORMAL LOW (ref 8.9–10.3)
Chloride: 106 mmol/L (ref 98–111)
Creatinine, Ser: 0.44 mg/dL (ref 0.44–1.00)
GFR calc non Af Amer: >60 mL/min (ref >60)
Glucose, Bld: 174 mg/dL — ABNORMAL HIGH (ref 70–99)
Potassium: 4 mmol/L (ref 3.5–5.1)
Sodium: 136 mmol/L (ref 135–145)

## 2018-10-12 LAB — GLUCOSE, CAPILLARY
GLUCOSE-CAPILLARY: 127 mg/dL — AB (ref 70–99)
Glucose-Capillary: 138 mg/dL — ABNORMAL HIGH (ref 70–99)
Glucose-Capillary: 146 mg/dL — ABNORMAL HIGH (ref 70–99)
Glucose-Capillary: 151 mg/dL — ABNORMAL HIGH (ref 70–99)
Glucose-Capillary: 172 mg/dL — ABNORMAL HIGH (ref 70–99)

## 2018-10-12 LAB — CBC
HCT: 33 % — ABNORMAL LOW (ref 36.0–46.0)
Hemoglobin: 10.8 g/dL — ABNORMAL LOW (ref 12.0–15.0)
MCH: 30.4 pg (ref 26.0–34.0)
MCHC: 32.7 g/dL (ref 30.0–36.0)
MCV: 93 fL (ref 80.0–100.0)
NRBC: 0 % (ref 0.0–0.2)
PLATELETS: 188 10*3/uL (ref 150–400)
RBC: 3.55 MIL/uL — ABNORMAL LOW (ref 3.87–5.11)
RDW: 13.4 % (ref 11.5–15.5)
WBC: 8.8 10*3/uL (ref 4.0–10.5)

## 2018-10-12 LAB — ALBUMIN: Albumin: 2.8 g/dL — ABNORMAL LOW (ref 3.5–5.0)

## 2018-10-12 LAB — HEMOGLOBIN A1C
Hgb A1c MFr Bld: 5.4 % (ref 4.8–5.6)
Mean Plasma Glucose: 108.28 mg/dL

## 2018-10-12 LAB — PROCALCITONIN: Procalcitonin: 0.21 ng/mL

## 2018-10-12 LAB — MRSA PCR SCREENING: MRSA by PCR: NEGATIVE

## 2018-10-12 MED ORDER — CLONAZEPAM 0.25 MG PO TBDP
0.5000 mg | ORAL_TABLET | Freq: Every evening | ORAL | Status: DC
  Administered 2018-10-12 – 2018-10-13 (×2): 0.5 mg via ORAL
  Filled 2018-10-12: qty 1
  Filled 2018-10-12: qty 2

## 2018-10-12 MED ORDER — INSULIN ASPART 100 UNIT/ML ~~LOC~~ SOLN
0.0000 [IU] | Freq: Three times a day (TID) | SUBCUTANEOUS | Status: DC
  Administered 2018-10-12: 2 [IU] via SUBCUTANEOUS
  Administered 2018-10-12: 3 [IU] via SUBCUTANEOUS
  Administered 2018-10-12 – 2018-10-13 (×4): 2 [IU] via SUBCUTANEOUS
  Filled 2018-10-12 (×6): qty 1

## 2018-10-12 MED ORDER — INSULIN ASPART 100 UNIT/ML ~~LOC~~ SOLN: 0.0000 [IU] | Freq: Every day | SUBCUTANEOUS | Status: DC

## 2018-10-12 MED ORDER — METHYLPREDNISOLONE SODIUM SUCC 125 MG IJ SOLR
60.0000 mg | Freq: Four times a day (QID) | INTRAMUSCULAR | Status: DC
  Administered 2018-10-12 – 2018-10-13 (×4): 60 mg via INTRAVENOUS
  Filled 2018-10-12 (×4): qty 2

## 2018-10-12 MED ORDER — IPRATROPIUM-ALBUTEROL 0.5-2.5 (3) MG/3ML IN SOLN
3.0000 mL | RESPIRATORY_TRACT | Status: DC | PRN
  Administered 2018-10-12 (×3): 3 mL via RESPIRATORY_TRACT
  Filled 2018-10-12 (×5): qty 3

## 2018-10-12 MED ORDER — SODIUM CHLORIDE 0.9 % IV SOLN
1.0000 g | INTRAVENOUS | Status: DC
  Filled 2018-10-12: qty 10

## 2018-10-12 MED ORDER — DM-GUAIFENESIN ER 30-600 MG PO TB12
1.0000 | ORAL_TABLET | Freq: Two times a day (BID) | ORAL | Status: DC | PRN
  Filled 2018-10-12 (×2): qty 1

## 2018-10-12 MED ORDER — FLUTICASONE PROPIONATE 50 MCG/ACT NA SUSP: 2.0000 | Freq: Every day | NASAL | Status: DC

## 2018-10-12 MED ORDER — BENZONATATE 100 MG PO CAPS
200.0000 mg | ORAL_CAPSULE | Freq: Three times a day (TID) | ORAL | Status: DC | PRN
  Administered 2018-10-12 – 2018-10-13 (×3): 200 mg via ORAL
  Filled 2018-10-12 (×3): qty 2

## 2018-10-12 MED ORDER — FLUTICASONE FUROATE-VILANTEROL 100-25 MCG/INH IN AEPB
1.0000 | INHALATION_SPRAY | Freq: Every day | RESPIRATORY_TRACT | Status: DC
  Administered 2018-10-12 – 2018-10-13 (×2): 1 via RESPIRATORY_TRACT
  Filled 2018-10-12: qty 28

## 2018-10-12 MED ORDER — ACETAMINOPHEN 650 MG RE SUPP: 650.0000 mg | Freq: Four times a day (QID) | RECTAL | Status: DC | PRN

## 2018-10-12 MED ORDER — SODIUM CHLORIDE 0.9 % IV SOLN
INTRAVENOUS | Status: DC | PRN
  Administered 2018-10-12: 04:00:00 via INTRAVENOUS
  Administered 2018-10-13: 17:00:00 250 mL via INTRAVENOUS

## 2018-10-12 MED ORDER — GUAIFENESIN ER 600 MG PO TB12
600.0000 mg | ORAL_TABLET | Freq: Two times a day (BID) | ORAL | Status: DC | PRN
  Administered 2018-10-12 (×2): 600 mg via ORAL
  Filled 2018-10-12 (×2): qty 1

## 2018-10-12 MED ORDER — ENOXAPARIN SODIUM 40 MG/0.4ML ~~LOC~~ SOLN
40.0000 mg | SUBCUTANEOUS | Status: DC
  Administered 2018-10-12: 40 mg via SUBCUTANEOUS
  Filled 2018-10-12: qty 0.4

## 2018-10-12 MED ORDER — DOXYCYCLINE HYCLATE 100 MG PO TABS: 100.0000 mg | ORAL_TABLET | Freq: Two times a day (BID) | ORAL | Status: DC

## 2018-10-12 MED ORDER — ONDANSETRON HCL 4 MG/2ML IJ SOLN: 4.0000 mg | Freq: Four times a day (QID) | INTRAMUSCULAR | Status: DC | PRN

## 2018-10-12 MED ORDER — DEXTROMETHORPHAN POLISTIREX ER 30 MG/5ML PO SUER
30.0000 mg | Freq: Two times a day (BID) | ORAL | Status: DC | PRN
  Administered 2018-10-12 – 2018-10-13 (×3): 30 mg via ORAL
  Filled 2018-10-12 (×6): qty 5

## 2018-10-12 MED ORDER — ENOXAPARIN SODIUM 40 MG/0.4ML ~~LOC~~ SOLN
40.0000 mg | SUBCUTANEOUS | Status: DC
  Administered 2018-10-13: 40 mg via SUBCUTANEOUS
  Filled 2018-10-12: qty 0.4

## 2018-10-12 MED ORDER — PREGABALIN 75 MG PO CAPS
75.0000 mg | ORAL_CAPSULE | Freq: Two times a day (BID) | ORAL | Status: DC
  Administered 2018-10-12 – 2018-10-14 (×5): 75 mg via ORAL
  Filled 2018-10-12 (×5): qty 1

## 2018-10-12 MED ORDER — SODIUM CHLORIDE 0.9 % IV SOLN
1.0000 g | INTRAVENOUS | Status: DC
  Administered 2018-10-12 – 2018-10-13 (×2): 1 g via INTRAVENOUS
  Filled 2018-10-12 (×2): qty 1
  Filled 2018-10-12: qty 10

## 2018-10-12 MED ORDER — SALINE SPRAY 0.65 % NA SOLN
1.0000 | NASAL | Status: DC | PRN
  Filled 2018-10-12 (×2): qty 44

## 2018-10-12 MED ORDER — AZITHROMYCIN 500 MG PO TABS
500.0000 mg | ORAL_TABLET | Freq: Every day | ORAL | Status: DC
  Administered 2018-10-12 – 2018-10-13 (×2): 500 mg via ORAL
  Filled 2018-10-12 (×2): qty 1

## 2018-10-12 MED ORDER — ACETAMINOPHEN 325 MG PO TABS
650.0000 mg | ORAL_TABLET | Freq: Four times a day (QID) | ORAL | Status: DC | PRN
  Administered 2018-10-12 (×2): 650 mg via ORAL
  Filled 2018-10-12 (×2): qty 2

## 2018-10-12 MED ORDER — SODIUM CHLORIDE 0.9 % IV BOLUS
500.0000 mL | Freq: Once | INTRAVENOUS | Status: AC
  Administered 2018-10-12: 500 mL via INTRAVENOUS

## 2018-10-12 MED ORDER — IBUPROFEN 400 MG PO TABS: 600.0000 mg | ORAL_TABLET | Freq: Four times a day (QID) | ORAL | Status: DC | PRN

## 2018-10-12 MED ORDER — ONDANSETRON HCL 4 MG PO TABS: 4.0000 mg | ORAL_TABLET | Freq: Four times a day (QID) | ORAL | Status: DC | PRN

## 2018-10-12 MED ORDER — ALBUMIN HUMAN 25 % IV SOLN
12.5000 g | Freq: Once | INTRAVENOUS | Status: AC
  Administered 2018-10-12: 12.5 g via INTRAVENOUS
  Filled 2018-10-12: qty 50

## 2018-10-12 MED ORDER — SODIUM CHLORIDE 0.9 % IV SOLN: 1.0000 g | INTRAVENOUS | Status: DC

## 2018-10-12 MED ORDER — DOXYCYCLINE HYCLATE 100 MG PO TABS
100.0000 mg | ORAL_TABLET | Freq: Two times a day (BID) | ORAL | Status: DC
  Administered 2018-10-12: 100 mg via ORAL
  Filled 2018-10-12: qty 1

## 2018-10-12 MED ORDER — MENTHOL 3 MG MT LOZG
1.0000 | LOZENGE | OROMUCOSAL | Status: DC | PRN
  Administered 2018-10-12: 3 mg via ORAL
  Filled 2018-10-12 (×2): qty 9

## 2018-10-12 MED ORDER — METHYLPREDNISOLONE SODIUM SUCC 125 MG IJ SOLR
60.0000 mg | Freq: Four times a day (QID) | INTRAMUSCULAR | Status: DC
  Administered 2018-10-12: 60 mg via INTRAVENOUS
  Filled 2018-10-12: qty 2

## 2018-10-12 NOTE — ED Notes (Signed)
Delay on Pt transfer to CCU due to high number of EMS's in the ED.

## 2018-10-12 NOTE — Progress Notes (Signed)
eLink Physician-Brief Progress Note Patient Name: Colleen [name] DOB: 27-Aug-1961 MRN: [id number]   Date of Service  10/12/2018  HPI/Events of Note  57 yo female admitted with sepsis and acute on chronic hypoxic respiratory failure secondary to AECOPD and possible pneumonia requiring Bipap. PCCM asked to assume care in ICU. VSS.   eICU Interventions  No new orders.      Intervention Category Evaluation Type: New Patient Evaluation  Oberon Hehir [place] 10/12/2018, 1:05 AM

## 2018-10-12 NOTE — Progress Notes (Signed)
Transported pt to ICU 4 on Bipap without incident. Pt remains on Bipap and tol well at this time. Report given to ICU RT.

## 2018-10-12 NOTE — Progress Notes (Signed)
[name] [place] NAME: Colleen [name]    MR#:  [id number]  DATE OF BIRTH:  09-04-1961  SUBJECTIVE:   Patient here due to shortness of breath and noted to be in COPD exacerbation secondary to a pneumonia.  Initially was on BiPAP but now weaned off of it.  Patient clinically feels better.  REVIEW OF SYSTEMS:    Review of Systems  Constitutional: Negative for chills and fever.  HENT: Negative for congestion and tinnitus.   Eyes: Negative for blurred vision and double vision.  Respiratory: Negative for cough, shortness of breath and wheezing.   Cardiovascular: Negative for chest pain, orthopnea and PND.  Gastrointestinal: Negative for abdominal pain, diarrhea, nausea and vomiting.  Genitourinary: Negative for dysuria and hematuria.  Neurological: Negative for dizziness, sensory change and focal weakness.  All other systems reviewed and are negative.   Nutrition: Carb modified Tolerating Diet: yes Tolerating PT: Await Eval.   DRUG ALLERGIES:   Allergies  Allergen Reactions  . Morphine And Related Anaphylaxis  . Penicillins Anaphylaxis    Has patient had a PCN reaction causing immediate rash, facial/tongue/throat swelling, SOB or lightheadedness with hypotension: Yes Has patient had a PCN reaction causing severe rash involving mucus membranes or skin necrosis: No Has patient had a PCN reaction that required hospitalization: Unknown Has patient had a PCN reaction occurring within the last 10 years: No If all of the above answers are "NO", then may proceed with Cephalosporin use.   . Tamoxifen Anaphylaxis  . Hydroxyzine Itching  . Sulfa Antibiotics Itching    VITALS:  Blood pressure 95/60, pulse 73, temperature 97.8 F (36.6 C), temperature source Oral, resp. rate (!) 23, height 5' (1.524 m), weight 61 kg, SpO2 98 %.  PHYSICAL EXAMINATION:   Physical Exam  GENERAL:  57 y.o.-year-old patient lying in bed in no acute  distress.  EYES: Pupils equal, round, reactive to light and accommodation. No scleral icterus. Extraocular muscles intact.  HEENT: Head atraumatic, normocephalic. Oropharynx and nasopharynx clear.  NECK:  Supple, no jugular venous distention. No thyroid enlargement, no tenderness.  LUNGS: Good air entry bilaterally, minimal wheezing bilaterally, no rales, rhonchi, negative use of accessory muscles. CARDIOVASCULAR: S1, S2 normal. No murmurs, rubs, or gallops.  ABDOMEN: Soft, nontender, nondistended. Bowel sounds present. No organomegaly or mass.  EXTREMITIES: No cyanosis, clubbing or edema b/l.    NEUROLOGIC: Cranial nerves II through XII are intact. No focal Motor or sensory deficits b/l.   PSYCHIATRIC: The patient is alert and oriented x 3.  SKIN: No obvious rash, lesion, or ulcer.    LABORATORY PANEL:   CBC Recent Labs  Lab 10/12/18 0529  WBC 8.8  HGB 10.8*  HCT 33.0*  PLT 188   ------------------------------------------------------------------------------------------------------------------  Chemistries  Recent Labs  Lab 10/11/18 2036 10/12/18 0529  NA 133* 136  K 3.8 4.0  CL 99 106  CO2 24 22  GLUCOSE 142* 174*  BUN 9 7  CREATININE 0.59 0.44  CALCIUM 8.4* 7.4*  AST 20  --   ALT 14  --   ALKPHOS 57  --   BILITOT 0.6  --    ------------------------------------------------------------------------------------------------------------------  Cardiac Enzymes Recent Labs  Lab 10/11/18 2036  TROPONINI <0.03   ------------------------------------------------------------------------------------------------------------------  RADIOLOGY:  Dg Chest Portable 1 View  Result Date: 10/11/2018 CLINICAL DATA:  Shortness of breath EXAM: PORTABLE CHEST 1 VIEW COMPARISON:  None. FINDINGS: There is hyperinflation of the lungs compatible with COPD. Airspace  disease in the left lower lobe could reflect atelectasis or pneumonia. Heart is normal size. No acute bony abnormality.  IMPRESSION: COPD.  Left basilar/lingular atelectasis or pneumonia. Electronically Signed   By: Rolm Baptise M.D.   On: 10/11/2018 21:05     ASSESSMENT AND PLAN:   57 year old female with past medical history of asthma/COPD history of breast cancer presented to the hospital due to shortness of breath, respiratory distress.  1.  Acute respiratory failure with hypoxia-secondary to COPD exacerbation. - Initially patient was on BiPAP but now has been weaned off of it. -Continue IV steroids, scheduled duo nebs, Pulmicort nebs and empiric antibiotics for treatment of underlying COPD exacerbation.  Patient is improving. - Assist the patient for home oxygen prior to discharge.  2.  COPD exacerbation- source of patient's worsening hypoxemia.  Suspected to be secondary to community acquired pneumonia. - Continue IV steroids, scheduled duo nebs, Pulmicort nebs. -Continue empiric antibiotics with ceftriaxone, Zithromax.  Follow cultures.  Assess the patient for home oxygen prior to discharge.  3.  Neuropathy-continue Lyrica.  4.  Anxiety-continue Klonopin at bedtime.  Plan to transfer pt. To floor today.    All the records are reviewed and case discussed with Care Management/Social Worker. Management plans discussed with the patient, family and they are in agreement.  CODE STATUS: Full code  DVT Prophylaxis: Lovenox  TOTAL TIME TAKING CARE OF THIS PATIENT: 30 minutes.   POSSIBLE D/C IN 1-2 DAYS, DEPENDING ON CLINICAL CONDITION.   Henreitta Leber M.D on 10/12/2018 at 3:18 PM  Between 7am to 6pm - Pager - [phone]  After 6pm go to [email or web address] - Proofreader  [facility]  Office  [phone]  CC: Primary care physician; Gae Bon, NP

## 2018-10-12 NOTE — Progress Notes (Signed)
Pharmacy  Monitoring Consult:  Pharmacy consulted to assist in monitoring and replacing electrolytes and glucose management in this 57 y.o. female admitted on 10/11/2018 with acute on chronic hypoxic respiratory failure secondary to AECOPD.   Labs:  Sodium (mmol/L)  Date Value  10/12/2018 136   Potassium (mmol/L)  Date Value  10/12/2018 4.0   Calcium (mg/dL)  Date Value  10/12/2018 7.4 (L)   Albumin (g/dL)  Date Value  10/11/2018 3.7   Corrected calcium: 8.  Assessment/Plan: 1. Electrolytes: No replacement warranted at this time. Will obtain follow up BMP/Magnesium with am labs.   2. Glucose: patient ordered methylprednisolone 60mg  IV Q6hr. Patient is currently ordered moderate scale SSI and has received 5 total units of coverage today. Patient does not use diabetic medications asa n outpatient and does not have a listed diagnosis of diabetes. Recommend tapering steroids as appropriate.   Pharmacy will continue to monitor and adjust per consult.   Simpson,Michael L 10/12/2018 12:49 PM

## 2018-10-13 ENCOUNTER — Inpatient Hospital Stay

## 2018-10-13 DIAGNOSIS — J9601 Acute respiratory failure with hypoxia: Secondary | ICD-10-CM | POA: Diagnosis not present

## 2018-10-13 LAB — GLUCOSE, CAPILLARY
Glucose-Capillary: 135 mg/dL — ABNORMAL HIGH (ref 70–99)
Glucose-Capillary: 138 mg/dL — ABNORMAL HIGH (ref 70–99)
Glucose-Capillary: 123 mg/dL — ABNORMAL HIGH (ref 70–99)
Glucose-Capillary: 107 mg/dL — ABNORMAL HIGH (ref 70–99)

## 2018-10-13 LAB — BASIC METABOLIC PANEL
ANION GAP: 7 (ref 5–15)
BUN: 11 mg/dL (ref 6–20)
CO2: 23 mmol/L (ref 22–32)
Calcium: 8.1 mg/dL — ABNORMAL LOW (ref 8.9–10.3)
Chloride: 106 mmol/L (ref 98–111)
Creatinine, Ser: 0.45 mg/dL (ref 0.44–1.00)
GFR calc Af Amer: >60 mL/min (ref >60)
GFR calc non Af Amer: >60 mL/min (ref >60)
Glucose, Bld: 149 mg/dL — ABNORMAL HIGH (ref 70–99)
Potassium: 4.2 mmol/L (ref 3.5–5.1)
Sodium: 136 mmol/L (ref 135–145)

## 2018-10-13 LAB — CBC
HCT: 32.1 % — ABNORMAL LOW (ref 36.0–46.0)
Hemoglobin: 10.7 g/dL — ABNORMAL LOW (ref 12.0–15.0)
MCH: 30.6 pg (ref 26.0–34.0)
MCHC: 33.3 g/dL (ref 30.0–36.0)
MCV: 91.7 fL (ref 80.0–100.0)
Platelets: 250 10*3/uL (ref 150–400)
RBC: 3.5 MIL/uL — ABNORMAL LOW (ref 3.87–5.11)
RDW: 13.7 % (ref 11.5–15.5)
WBC: 13.1 10*3/uL — ABNORMAL HIGH (ref 4.0–10.5)
nRBC: 0 % (ref 0.0–0.2)

## 2018-10-13 LAB — HIV ANTIBODY (ROUTINE TESTING W REFLEX): HIV Screen 4th Generation wRfx: NONREACTIVE

## 2018-10-13 IMAGING — DX DG CHEST 1V PORT
1 series · 1 of 1 positions shown · non-contrast
Comparison: Two days ago

CLINICAL DATA: Acute respiratory failure

EXAM:
PORTABLE CHEST 1 VIEW

[chest ap]
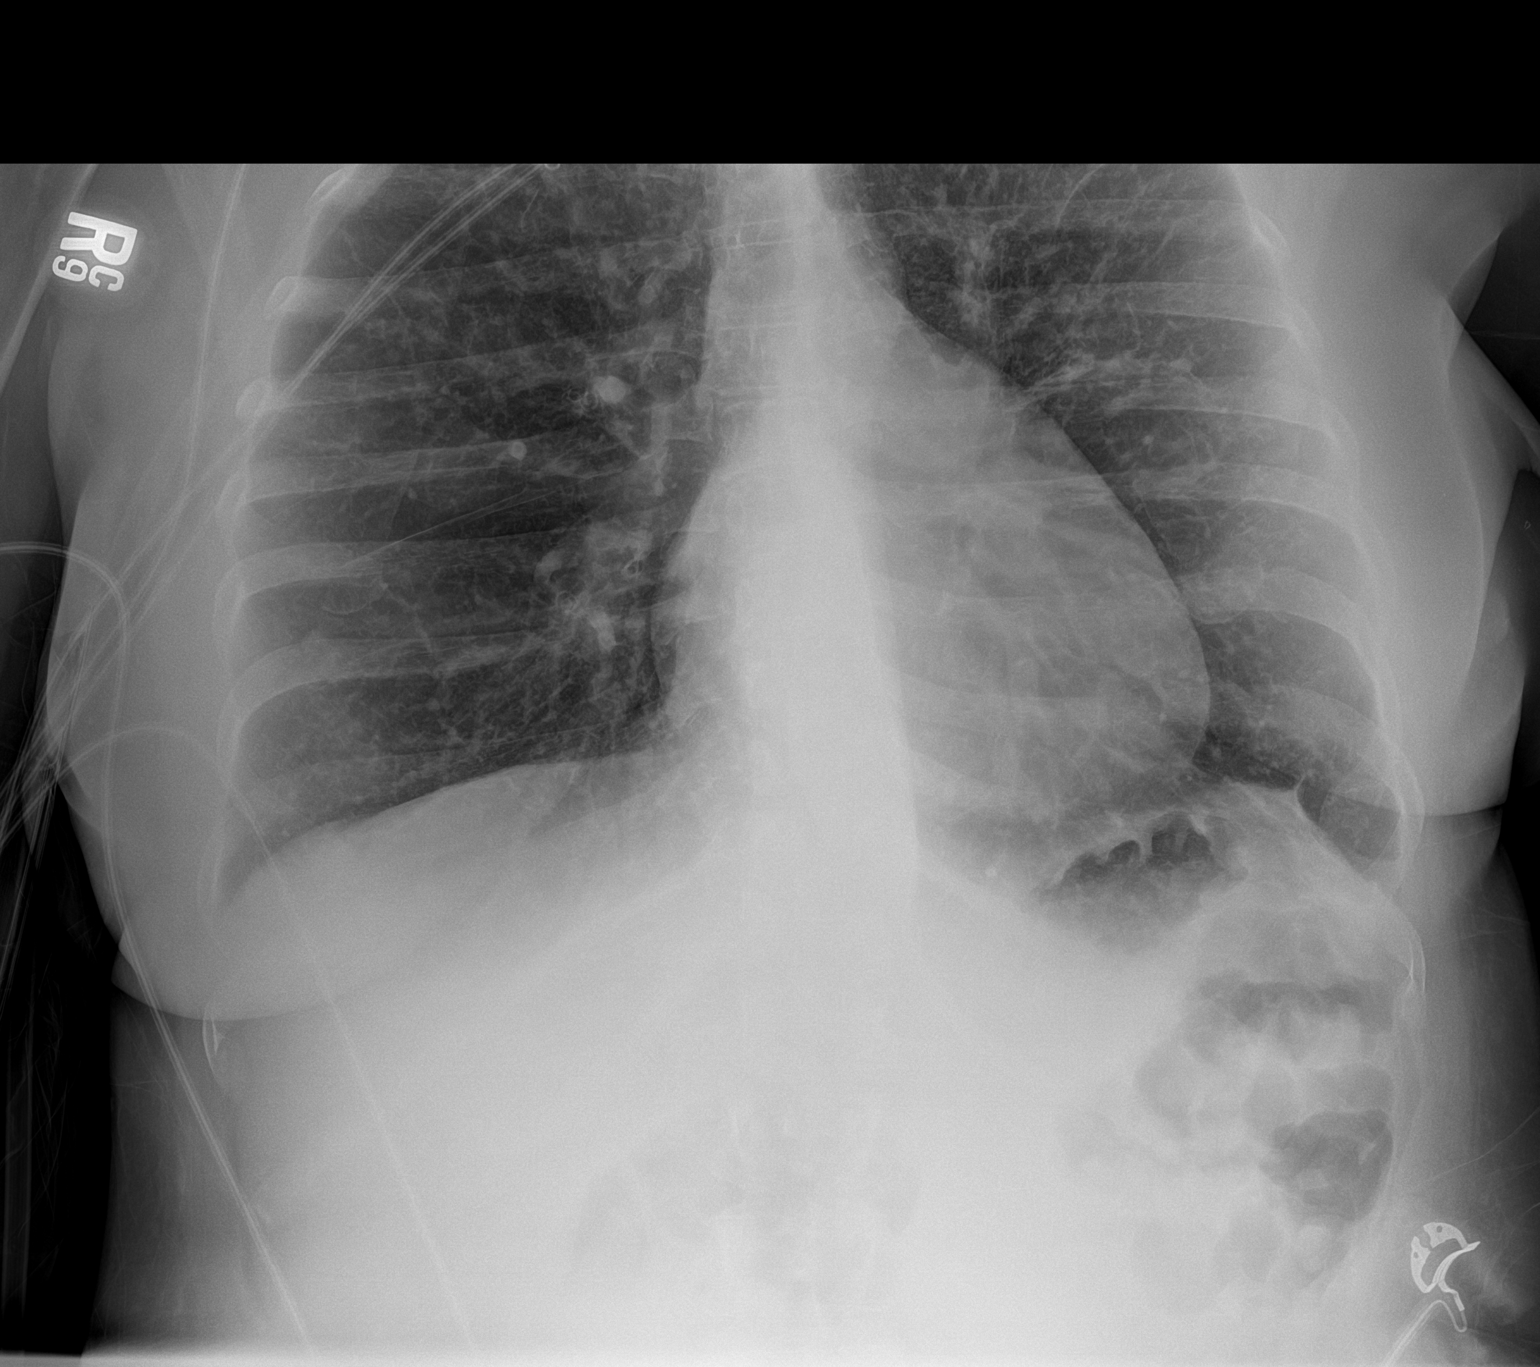

[1 of 1 positions shown; findings below may reference images not displayed]

FINDINGS: COPD with interstitial coarsening and hyperinflation. Left base
opacity on prior is resolved. There is no edema, consolidation,
effusion, or pneumothorax. Normal heart size and mediastinal
contours.
IMPRESSION: 1. Resolved left base opacity.  No evidence of pneumonia.
2. COPD.

## 2018-10-13 MED ORDER — METHYLPREDNISOLONE SODIUM SUCC 40 MG IJ SOLR
40.0000 mg | Freq: Two times a day (BID) | INTRAMUSCULAR | Status: DC
  Administered 2018-10-13: 21:00:00 40 mg via INTRAVENOUS
  Filled 2018-10-13: qty 1

## 2018-10-13 MED ORDER — SERTRALINE HCL 50 MG PO TABS
25.0000 mg | ORAL_TABLET | Freq: Every day | ORAL | Status: DC
  Administered 2018-10-13 – 2018-10-14 (×2): 25 mg via ORAL
  Filled 2018-10-13 (×2): qty 1

## 2018-10-13 MED ORDER — PHENOL 1.4 % MT LIQD
1.0000 | OROMUCOSAL | Status: DC | PRN
  Administered 2018-10-13: 1 via OROMUCOSAL
  Filled 2018-10-13: qty 177

## 2018-10-13 NOTE — Progress Notes (Signed)
Pharmacy  Monitoring Consult:  Pharmacy consulted to assist in monitoring and replacing electrolytes and glucose management in this 57 y.o. female admitted on 10/11/2018 with acute on chronic hypoxic respiratory failure secondary to AECOPD.   Labs:  Sodium (mmol/L)  Date Value  10/13/2018 136   Potassium (mmol/L)  Date Value  10/13/2018 4.2   Calcium (mg/dL)  Date Value  10/13/2018 8.1 (L)   Albumin (g/dL)  Date Value  10/12/2018 2.8 (L)   Corrected calcium: 8.  Assessment/Plan: 1. Electrolytes: No replacement warranted at this time. Will obtain follow up BMP with am labs.   2. Glucose: patient ordered methylprednisolone 60mg  IV Q6hr. Patient is currently ordered moderate scale SSI and has received 6 total units of coverage today. Patient does not use diabetic medications asa n outpatient and does not have a listed diagnosis of diabetes. Recommend tapering steroids as appropriate.   Pharmacy will continue to monitor and adjust per consult.   Paulina Fusi, PharmD, BCPS 10/13/2018 11:14 AM

## 2018-10-13 NOTE — Care Management (Signed)
Confirmed that [employer] provides patient's oxygen and it is nocturnal.  Patient does have portable concentrator to use when she travels.  She will need to have home 02 assessment performed at discharge to determine whether she qualified for continuous

## 2018-10-13 NOTE — Progress Notes (Signed)
[facility] [place] NAME: Colleen [name]    MR#:  [id number]  DATE OF BIRTH:  Aug 10, 1961  SUBJECTIVE:   The patient feels better, off BiPAP, on oxygen by nasal cannula 2 L. REVIEW OF SYSTEMS:    Review of Systems  Constitutional: Negative for chills and fever.  HENT: Negative for congestion and tinnitus.   Eyes: Negative for blurred vision and double vision.  Respiratory: Positive for cough and shortness of breath. Negative for wheezing.   Cardiovascular: Negative for chest pain, orthopnea, leg swelling and PND.  Gastrointestinal: Negative for abdominal pain, diarrhea, nausea and vomiting.  Genitourinary: Negative for dysuria and hematuria.  Musculoskeletal: Negative for joint pain.  Skin: Positive for rash.  Neurological: Negative for dizziness, sensory change and focal weakness.  Psychiatric/Behavioral: Negative for depression. The patient is not nervous/anxious.   All other systems reviewed and are negative.   Nutrition: Carb modified Tolerating Diet: yes Tolerating PT: Await Eval.   DRUG ALLERGIES:   Allergies  Allergen Reactions  . Morphine And Related Anaphylaxis  . Penicillins Anaphylaxis    Has patient had a PCN reaction causing immediate rash, facial/tongue/throat swelling, SOB or lightheadedness with hypotension: Yes Has patient had a PCN reaction causing severe rash involving mucus membranes or skin necrosis: No Has patient had a PCN reaction that required hospitalization: Unknown Has patient had a PCN reaction occurring within the last 10 years: No If all of the above answers are "NO", then may proceed with Cephalosporin use.   . Tamoxifen Anaphylaxis  . Hydroxyzine Itching  . Sulfa Antibiotics Itching    VITALS:  Blood pressure 106/64, pulse 74, temperature 98.4 F (36.9 C), temperature source Oral, resp. rate 20, height 5' (1.524 m), weight 61 kg, SpO2 95 %.  PHYSICAL EXAMINATION:   Physical  Exam  GENERAL:  57 y.o.-year-old patient lying in bed in no acute distress.  EYES: Pupils equal, round, reactive to light and accommodation. No scleral icterus. Extraocular muscles intact.  HEENT: Head atraumatic, normocephalic. Oropharynx and nasopharynx clear.  NECK:  Supple, no jugular venous distention. No thyroid enlargement, no tenderness.  LUNGS: Good air entry bilaterally, minimal wheezing bilaterally, no rales, rhonchi, negative use of accessory muscles. CARDIOVASCULAR: S1, S2 normal. No murmurs, rubs, or gallops.  ABDOMEN: Soft, nontender, nondistended. Bowel sounds present. No organomegaly or mass.  EXTREMITIES: No cyanosis, clubbing or edema b/l.    NEUROLOGIC: Cranial nerves II through XII are intact. No focal Motor or sensory deficits b/l.   PSYCHIATRIC: The patient is alert and oriented x 3.  SKIN: No obvious rash, lesion, or ulcer.    LABORATORY PANEL:   CBC Recent Labs  Lab 10/13/18 0605  WBC 13.1*  HGB 10.7*  HCT 32.1*  PLT 250   ------------------------------------------------------------------------------------------------------------------  Chemistries  Recent Labs  Lab 10/11/18 2036  10/13/18 0605  NA 133*   < > 136  K 3.8   < > 4.2  CL 99   < > 106  CO2 24   < > 23  GLUCOSE 142*   < > 149*  BUN 9   < > 11  CREATININE 0.59   < > 0.45  CALCIUM 8.4*   < > 8.1*  AST 20  --   --   ALT 14  --   --   ALKPHOS 57  --   --   BILITOT 0.6  --   --    < > = values in  this interval not displayed.   ------------------------------------------------------------------------------------------------------------------  Cardiac Enzymes Recent Labs  Lab 10/11/18 2036  TROPONINI <0.03   ------------------------------------------------------------------------------------------------------------------  RADIOLOGY:  Dg Chest Port 1 View  Result Date: 10/13/2018 CLINICAL DATA:  Acute respiratory failure EXAM: PORTABLE CHEST 1 VIEW COMPARISON:  Two days ago  FINDINGS: COPD with interstitial coarsening and hyperinflation. Left base opacity on prior is resolved. There is no edema, consolidation, effusion, or pneumothorax. Normal heart size and mediastinal contours. IMPRESSION: 1. Resolved left base opacity.  No evidence of pneumonia. 2. COPD. Electronically Signed   By: Monte Fantasia M.D.   On: 10/13/2018 11:16   Dg Chest Portable 1 View  Result Date: 10/11/2018 CLINICAL DATA:  Shortness of breath EXAM: PORTABLE CHEST 1 VIEW COMPARISON:  None. FINDINGS: There is hyperinflation of the lungs compatible with COPD. Airspace disease in the left lower lobe could reflect atelectasis or pneumonia. Heart is normal size. No acute bony abnormality. IMPRESSION: COPD.  Left basilar/lingular atelectasis or pneumonia. Electronically Signed   By: Rolm Baptise M.D.   On: 10/11/2018 21:05     ASSESSMENT AND PLAN:   57 year old female with past medical history of asthma/COPD history of breast cancer presented to the hospital due to shortness of breath, respiratory distress.  1.  Acute respiratory failure with hypoxia-secondary to COPD exacerbation. Off BiPAP, on O2 [place], taper IV steroids, scheduled duo nebs, Pulmicort nebs and empiric antibiotics for treatment of underlying COPD exacerbation.   Assess home oxygen prior to discharge.  2.  COPD exacerbation- source of patient's worsening hypoxemia.  Suspected to be secondary to community acquired pneumonia. Taper IV steroids, scheduled duo nebs, Pulmicort nebs. -Continue empiric antibiotics with ceftriaxone, Zithromax.  Follow cultures.  Assess the patient for home oxygen prior to discharge.  3.  Neuropathy-continue Lyrica.  4.  Anxiety-continue Klonopin at bedtime. Tobacco abuse.  Smoking cessation was counseled for 3 to 4 minutes.  All the records are reviewed and case discussed with Care Management/Social Worker. Management plans discussed with the patient, family and they are in agreement.  CODE STATUS: Full  code  DVT Prophylaxis: Lovenox  TOTAL TIME TAKING CARE OF THIS PATIENT: 35 minutes.   POSSIBLE D/C IN 1-2 DAYS, DEPENDING ON CLINICAL CONDITION.   Demetrios Loll M.D on 10/13/2018 at 5:03 PM  Between 7am to 6pm - Pager - [phone]  After 6pm go to [email or web address] - Proofreader  [facility]  Office  [phone]  CC: Primary care physician; Gae Bon, NP

## 2018-10-13 NOTE — Progress Notes (Signed)
Follow up - Critical Care Medicine Note  Patient Details:    Colleen [name] is an 57 y.o. female.with a PMH of COPD, Breast Cancer, Current Everyday Smoker, and Asthma. She presented to [facility] ER on 12/2 with c/o worsening shortness of breath,productive cough, and fever onset of symptoms 4 days prior to presentation. Upon arrival to the ER she was in severe respiratory distress in tripod position, and hypoxic with faint expiratory wheezes requiring Bipap. CXR concerning for LLL pneumonia vs. atelectasis. Lab results revealed Na+ 133, glucose 142, troponin <0.03, lactic acid 0.78, wbc 12.3, vbg pH 7.50/pCO2 34, and influenza pcr negative. She received duonebs x3, 2g iv magnesium, ceftriaxone, doxycycline, solumedrol, and 2L NS bolus. She was subsequently admitted to the stepdown unit for additional workup and treatment.     Anti-infectives:  Anti-infectives (From admission, onward)   Start     Dose/Rate Route Frequency Ordered Stop   10/12/18 2200  cefTRIAXone (ROCEPHIN) 1 g in sodium chloride 0.9 % 100 mL IVPB  Status:  Discontinued     1 g 200 mL/hr over 30 Minutes Intravenous Every 24 hours 10/12/18 0935 10/12/18 1241   10/12/18 2100  cefTRIAXone (ROCEPHIN) 1 g in sodium chloride 0.9 % 100 mL IVPB  Status:  Discontinued     1 g 200 mL/hr over 30 Minutes Intravenous Every 24 hours 10/12/18 0112 10/12/18 0935   10/12/18 1800  azithromycin (ZITHROMAX) tablet 500 mg     500 mg Oral Daily-1800 10/12/18 1240 10/17/18 1759   10/12/18 1800  cefTRIAXone (ROCEPHIN) 1 g in sodium chloride 0.9 % 100 mL IVPB     1 g 200 mL/hr over 30 Minutes Intravenous Every 24 hours 10/12/18 1241 10/19/18 1759   10/12/18 1000  doxycycline (VIBRA-TABS) tablet 100 mg  Status:  Discontinued     100 mg Oral Every 12 hours 10/12/18 0110 10/12/18 1240   10/12/18 0115  cefTRIAXone (ROCEPHIN) 1 g in sodium chloride 0.9 % 100 mL IVPB  Status:  Discontinued     1 g 200 mL/hr over 30 Minutes Intravenous Every 24 hours  10/12/18 0110 10/12/18 0112   10/12/18 0115  doxycycline (VIBRA-TABS) tablet 100 mg  Status:  Discontinued     100 mg Oral Every 12 hours 10/12/18 0110 10/12/18 0110   10/11/18 2030  cefTRIAXone (ROCEPHIN) 1 g in sodium chloride 0.9 % 100 mL IVPB     1 g 200 mL/hr over 30 Minutes Intravenous  Once 10/11/18 2028 10/12/18 2000   10/11/18 2030  doxycycline (VIBRA-TABS) tablet 100 mg     100 mg Oral  Once 10/11/18 2028 10/11/18 2053      Microbiology: Results for orders placed or performed during the hospital encounter of 10/11/18  Blood culture (routine x 2)     Status: None (Preliminary result)   Collection Time: 10/11/18  8:36 PM  Result Value Ref Range Status   Specimen Description BLOOD BLOOD RIGHT WRIST  Final   Special Requests   Final    BOTTLES DRAWN AEROBIC AND ANAEROBIC Blood Culture adequate volume   Culture   Final    NO GROWTH 2 DAYS Performed at [facility], [address] [place], [place] [address]    Report Status PENDING  Incomplete  Blood culture (routine x 2)     Status: None (Preliminary result)   Collection Time: 10/11/18  8:37 PM  Result Value Ref Range Status   Specimen Description BLOOD BLOOD RIGHT HAND  Final   Special Requests  Final    BOTTLES DRAWN AEROBIC AND ANAEROBIC Blood Culture adequate volume   Culture   Final    NO GROWTH 2 DAYS Performed at [facility], [address] [place], [place] [address]    Report Status PENDING  Incomplete  MRSA PCR Screening     Status: None   Collection Time: 10/12/18 12:45 AM  Result Value Ref Range Status   MRSA by PCR NEGATIVE NEGATIVE Final    Comment:        The GeneXpert MRSA Assay (FDA approved for NASAL specimens only), is one component of a comprehensive MRSA colonization surveillance program. It is not intended to diagnose MRSA infection nor to guide or monitor treatment for MRSA infections. Performed at [facility], [address] [place], [place] [address]     Studies: Dg Chest Portable 1 View  Result Date: 10/11/2018 CLINICAL DATA:  Shortness of breath EXAM: PORTABLE CHEST 1 VIEW COMPARISON:  None. FINDINGS: There is hyperinflation of the lungs compatible with COPD. Airspace disease in the left lower lobe could reflect atelectasis or pneumonia. Heart is normal size. No acute bony abnormality. IMPRESSION: COPD.  Left basilar/lingular atelectasis or pneumonia. Electronically Signed   By: Rolm Baptise M.D.   On: 10/11/2018 21:05    Consults:    Subjective:    Overnight Issues: Patient respiratory status has significantly improved.  Complaining of anxiety disorder  Objective:  Vital signs for last 24 hours: Temp:  [97.2 F (36.2 C)-98 F (36.7 C)] 97.8 F (36.6 C) (12/04 0800) Pulse Rate:  [65-106] 70 (12/04 0800) Resp:  [18-33] 18 (12/04 0800) BP: (85-114)/(50-90) 94/82 (12/04 0700) SpO2:  [90 %-100 %] 96 % (12/04 0800)  Hemodynamic parameters for last 24 hours:    Intake/Output from previous day: 12/03 0701 - 12/04 0700 In: 804.6 [P.O.:720; I.V.:58.6; IV Piggyback:26] Out: 8295 [Urine:1235]  Intake/Output this shift: No intake/output data recorded.  Vent settings for last 24 hours:    Physical Exam:  Vital signs: Please see the above listed vital signs HEENT: Trachea midline, no oral lesions noted, no thyromegaly appreciated Cardiovascular: Regular rate and rhythm Pulmonary: Clear to auscultation this morning Abdominal: Positive bowel sounds, soft exam Extremities: No clubbing, cyanosis or edema noted Neurologic: No focal deficits appreciated  Assessment/Plan:   Acute on chronic hypoxemic respiratory failure.  Patient is doing much better this morning.  Continues to be on azithromycin and Rocephin along with Solu-Medrol, albuterol and Breo. Stable for floor transfer will need follow up CXR imaging  Anxiety disorder.  Patient is on clonazepam.  May need SSRI with as needed benzodiazepine.  Will leave for primary  team  Leukocytosis.  Secondary to infection  Anemia.  No evidence of active bleeding  Hyperglycemia.  Should improve as decrease steroid dose  Hermelinda Dellen, DO  Kiran Lapine 10/13/2018  *Care during the described time interval was provided by me and/or other providers on the critical care team.  I have reviewed this patient's available data, including medical history, events of note, physical examination and test results as part of my evaluation.

## 2018-10-13 NOTE — Progress Notes (Signed)
Pt being transferred to room 117. Report called to [facility], RN. Pt and belongings transferred to room 117 without incident.

## 2018-10-14 LAB — GLUCOSE, CAPILLARY: Glucose-Capillary: 101 mg/dL — ABNORMAL HIGH (ref 70–99)

## 2018-10-14 MED ORDER — DIPHENHYDRAMINE HCL 25 MG PO CAPS
50.0000 mg | ORAL_CAPSULE | Freq: Two times a day (BID) | ORAL | Status: DC | PRN
  Administered 2018-10-14: 50 mg via ORAL
  Filled 2018-10-14: qty 2

## 2018-10-14 MED ORDER — BENZONATATE 200 MG PO CAPS: 200.0000 mg | ORAL_CAPSULE | Freq: Three times a day (TID) | ORAL | 0 refills | Status: DC | PRN

## 2018-10-14 MED ORDER — AZITHROMYCIN 500 MG PO TABS: 500.0000 mg | ORAL_TABLET | Freq: Every day | ORAL | 0 refills | Status: DC

## 2018-10-14 MED ORDER — PREDNISONE 20 MG PO TABS: 40.0000 mg | ORAL_TABLET | Freq: Every day | ORAL | 0 refills | Status: AC

## 2018-10-14 NOTE — Progress Notes (Signed)
Discharge instructions given and went over with patient at bedside. Prescriptions given and reviewed. All questions answered. Patient discharged home. Tia Hieronymus S, RN  

## 2018-10-14 NOTE — Progress Notes (Signed)
Patient complained of itchiness. drainage and allergies to possibly something in room. No complaints of itchy or swelling throat, no difficulty breathing. Dorethea Clan, MD--see new orders for benadryl.

## 2018-10-14 NOTE — Progress Notes (Signed)
SATURATION QUALIFICATIONS: (This note is used to comply with regulatory documentation for home oxygen)  Patient Saturations on Room Air at Rest = 94%  Patient Saturations on Room Air while Ambulating = 91%  Madlyn Frankel, RN

## 2018-10-14 NOTE — Discharge Summary (Signed)
[facility] NAME: Colleen [name]    MR#:  [id number]  DATE OF BIRTH:  [birth year]  DATE OF ADMISSION:  10/11/2018   ADMITTING PHYSICIAN: Lance Coon, MD  DATE OF DISCHARGE: 10/14/2018 12:01 PM  PRIMARY CARE PHYSICIAN: Gae Bon, NP   ADMISSION DIAGNOSIS:  Acute respiratory failure with hypoxia ([place]) [J96.01] Community acquired pneumonia, unspecified laterality [J18.9] Sepsis with acute hypoxic respiratory failure without septic shock, due to unspecified organism ([place]) [A41.9, R65.20, J96.01] DISCHARGE DIAGNOSIS:  Principal Problem:   Sepsis ([place]) Active Problems:   CAP (community acquired pneumonia)   COPD (chronic obstructive pulmonary disease) ([facility])   GAD (generalized anxiety disorder)   Acute respiratory failure ([place])  SECONDARY DIAGNOSIS:   Past Medical History:  Diagnosis Date  . Asthma   . Breast cancer ([place])   . COPD (chronic obstructive pulmonary disease) [facility])    HOSPITAL COURSE:  57 year old female with past medical history of asthma/COPD history of breast cancer presented to the hospital due to shortness of breath, respiratory distress.  1.  Acute respiratory failure with hypoxia-secondary to COPD exacerbation. Off BiPAP, on O2 [place], tapered IV steroids, scheduled duo nebs, Pulmicort nebs and empiric antibiotics for treatment of underlying COPD exacerbation.   Off O2 [place], no need for home oxygen during the day, continue O2  at night.  2.  COPD exacerbation- source of patient's worsening hypoxemia.  Suspected to be secondary to community acquired pneumonia. Tapered IV steroids,  Treated with duo nebs, Pulmicort nebs. -Continue Zithromax.  Changed to po prednisone.  3.  Neuropathy-continue Lyrica.  4.  Anxiety-continue Klonopin at bedtime. Tobacco abuse.  Smoking cessation was counseled for 3 to 4 minutes.  DISCHARGE CONDITIONS:  Stable, discharged to home today. CONSULTS OBTAINED:   DRUG  ALLERGIES:   Allergies  Allergen Reactions  . Morphine And Related Anaphylaxis  . Penicillins Anaphylaxis    Has patient had a PCN reaction causing immediate rash, facial/tongue/throat swelling, SOB or lightheadedness with hypotension: Yes Has patient had a PCN reaction causing severe rash involving mucus membranes or skin necrosis: No Has patient had a PCN reaction that required hospitalization: Unknown Has patient had a PCN reaction occurring within the last 10 years: No If all of the above answers are "NO", then may proceed with Cephalosporin use.   . Tamoxifen Anaphylaxis  . Hydroxyzine Itching  . Sulfa Antibiotics Itching   DISCHARGE MEDICATIONS:   Allergies as of 10/14/2018      Reactions   Morphine And Related Anaphylaxis   Penicillins Anaphylaxis   Has patient had a PCN reaction causing immediate rash, facial/tongue/throat swelling, SOB or lightheadedness with hypotension: Yes Has patient had a PCN reaction causing severe rash involving mucus membranes or skin necrosis: No Has patient had a PCN reaction that required hospitalization: Unknown Has patient had a PCN reaction occurring within the last 10 years: No If all of the above answers are "NO", then may proceed with Cephalosporin use.   Tamoxifen Anaphylaxis   Hydroxyzine Itching   Sulfa Antibiotics Itching      Medication List    TAKE these medications   azithromycin 500 MG tablet Commonly known as:  ZITHROMAX Take 1 tablet (500 mg total) by mouth daily at 6 PM.   benzonatate 200 MG capsule Commonly known as:  TESSALON Take 1 capsule (200 mg total) by mouth 3 (three) times daily as needed for cough.   BREO ELLIPTA 100-25 MCG/INH Aepb Generic drug:  fluticasone  furoate-vilanterol Inhale 1 puff into the lungs daily as needed.   cetirizine-pseudoephedrine 5-120 MG tablet Commonly known as:  ZYRTEC-D Take 1 tablet by mouth 2 (two) times daily as needed for allergies.   clonazePAM 0.5 MG disintegrating  tablet Commonly known as:  KLONOPIN Take 0.5 mg by mouth every evening.   hydrOXYzine 25 MG tablet Commonly known as:  ATARAX/VISTARIL Take 1 tablet (25 mg total) by mouth every 8 (eight) hours as needed for itching.   loratadine 10 MG tablet Commonly known as:  CLARITIN Take 10 mg by mouth daily.   predniSONE 20 MG tablet Commonly known as:  DELTASONE Take 2 tablets (40 mg total) by mouth daily for 3 days. What changed:    medication strength  how much to take  how to take this  when to take this  additional instructions   pregabalin 75 MG capsule Commonly known as:  LYRICA Take 75 mg by mouth 2 (two) times daily.   VENTOLIN HFA 108 (90 Base) MCG/ACT inhaler Generic drug:  albuterol Inhale 1-2 puffs into the lungs every 4 (four) hours as needed for cough or wheezing.        DISCHARGE INSTRUCTIONS:  See AVS. If you experience worsening of your admission symptoms, develop shortness of breath, life threatening emergency, suicidal or homicidal thoughts you must seek medical attention immediately by calling 911 or calling your MD immediately  if symptoms less severe.  You Must read complete instructions/literature along with all the possible adverse reactions/side effects for all the Medicines you take and that have been prescribed to you. Take any new Medicines after you have completely understood and accpet all the possible adverse reactions/side effects.   Please note  You were cared for by a hospitalist during your hospital stay. If you have any questions about your discharge medications or the care you received while you were in the hospital after you are discharged, you can call the unit and asked to speak with the hospitalist on call if the hospitalist that took care of you is not available. Once you are discharged, your primary care physician will handle any further medical issues. Please note that NO REFILLS for any discharge medications will be authorized once you  are discharged, as it is imperative that you return to your primary care physician (or establish a relationship with a primary care physician if you do not have one) for your aftercare needs so that they can reassess your need for medications and monitor your lab values.    On the day of Discharge:  VITAL SIGNS:  Blood pressure 120/82, pulse 74, temperature 98.3 F (36.8 C), temperature source Oral, resp. rate 20, height 5' (1.524 m), weight 61 kg, SpO2 95 %. PHYSICAL EXAMINATION:  GENERAL:  57 y.o.-year-old patient lying in the bed with no acute distress.  EYES: Pupils equal, round, reactive to light and accommodation. No scleral icterus. Extraocular muscles intact.  HEENT: Head atraumatic, normocephalic. Oropharynx and nasopharynx clear.  NECK:  Supple, no jugular venous distention. No thyroid enlargement, no tenderness.  LUNGS: Normal breath sounds bilaterally, no wheezing, rales,rhonchi or crepitation. No use of accessory muscles of respiration.  CARDIOVASCULAR: S1, S2 normal. No murmurs, rubs, or gallops.  ABDOMEN: Soft, non-tender, non-distended. Bowel sounds present. No organomegaly or mass.  EXTREMITIES: No pedal edema, cyanosis, or clubbing.  NEUROLOGIC: Cranial nerves II through XII are intact. Muscle strength 5/5 in all extremities. Sensation intact. Gait not checked.  PSYCHIATRIC: The patient is alert and oriented x 3.  SKIN: No obvious rash, lesion, or ulcer.  DATA REVIEW:   CBC Recent Labs  Lab 10/13/18 0605  WBC 13.1*  HGB 10.7*  HCT 32.1*  PLT 250    Chemistries  Recent Labs  Lab 10/11/18 2036  10/13/18 0605  NA 133*   < > 136  K 3.8   < > 4.2  CL 99   < > 106  CO2 24   < > 23  GLUCOSE 142*   < > 149*  BUN 9   < > 11  CREATININE 0.59   < > 0.45  CALCIUM 8.4*   < > 8.1*  AST 20  --   --   ALT 14  --   --   ALKPHOS 57  --   --   BILITOT 0.6  --   --    < > = values in this interval not displayed.     Microbiology Results  Results for orders placed  or performed during the hospital encounter of 10/11/18  Blood culture (routine x 2)     Status: None (Preliminary result)   Collection Time: 10/11/18  8:36 PM  Result Value Ref Range Status   Specimen Description BLOOD BLOOD RIGHT WRIST  Final   Special Requests   Final    BOTTLES DRAWN AEROBIC AND ANAEROBIC Blood Culture adequate volume   Culture   Final    NO GROWTH 3 DAYS Performed at [facility], [address], [place], [place] [address]    Report Status PENDING  Incomplete  Blood culture (routine x 2)     Status: None (Preliminary result)   Collection Time: 10/11/18  8:37 PM  Result Value Ref Range Status   Specimen Description BLOOD BLOOD RIGHT HAND  Final   Special Requests   Final    BOTTLES DRAWN AEROBIC AND ANAEROBIC Blood Culture adequate volume   Culture   Final    NO GROWTH 3 DAYS Performed at [facility], [address], [place], [place] [address]    Report Status PENDING  Incomplete  MRSA PCR Screening     Status: None   Collection Time: 10/12/18 12:45 AM  Result Value Ref Range Status   MRSA by PCR NEGATIVE NEGATIVE Final    Comment:        The GeneXpert MRSA Assay (FDA approved for NASAL specimens only), is one component of a comprehensive MRSA colonization surveillance program. It is not intended to diagnose MRSA infection nor to guide or monitor treatment for MRSA infections. Performed at [facility], [address] [place], [place] [address]     RADIOLOGY:  No results found.   Management plans discussed with the patient, family and they are in agreement.  CODE STATUS: Full Code   TOTAL TIME TAKING CARE OF THIS PATIENT: 32 minutes.    Demetrios Loll M.D on 10/14/2018 at 2:00 PM  Between 7am to 6pm - Pager - [phone]  After 6pm go to [email or web address] - Proofreader  [employer] [facility]  Office  [phone]  CC: Primary care physician; Gae Bon, NP   Note: This dictation  was prepared with Dragon dictation along with smaller phrase technology. Any transcriptional errors that result from this process are unintentional.

## 2018-10-16 LAB — CULTURE, BLOOD (ROUTINE X 2)
CULTURE: NO GROWTH
Culture: NO GROWTH
SPECIAL REQUESTS: ADEQUATE
Special Requests: ADEQUATE

## 2019-01-13 ENCOUNTER — Other Ambulatory Visit: Payer: Self-pay | Admitting: Family Medicine

## 2019-01-13 DIAGNOSIS — Z1231 Encounter for screening mammogram for malignant neoplasm of breast: Secondary | ICD-10-CM

## 2019-04-28 ENCOUNTER — Other Ambulatory Visit: Payer: Self-pay

## 2019-04-28 ENCOUNTER — Ambulatory Visit
Admission: RE | Admit: 2019-04-28 | Discharge: 2019-04-28 | Disposition: A | Discharge status: Home / Self Care | Payer: 59 | Source: Ambulatory Visit | Attending: Family Medicine | Admitting: Family Medicine

## 2019-04-28 DIAGNOSIS — Z1231 Encounter for screening mammogram for malignant neoplasm of breast: Secondary | ICD-10-CM | POA: Diagnosis not present

## 2019-04-28 HISTORY — DX: Personal history of irradiation: Z92.3

## 2019-04-28 IMAGING — MG DIGITAL SCREENING BILATERAL MAMMOGRAM WITH TOMO AND CAD
6 of 12 series · 6 of 36 positions shown · non-contrast
Comparison: Previous exam(s).

CLINICAL DATA: Screening.

EXAM:
DIGITAL SCREENING BILATERAL MAMMOGRAM WITH TOMO AND CAD

[R MLO synth-2D (1 of 2)]
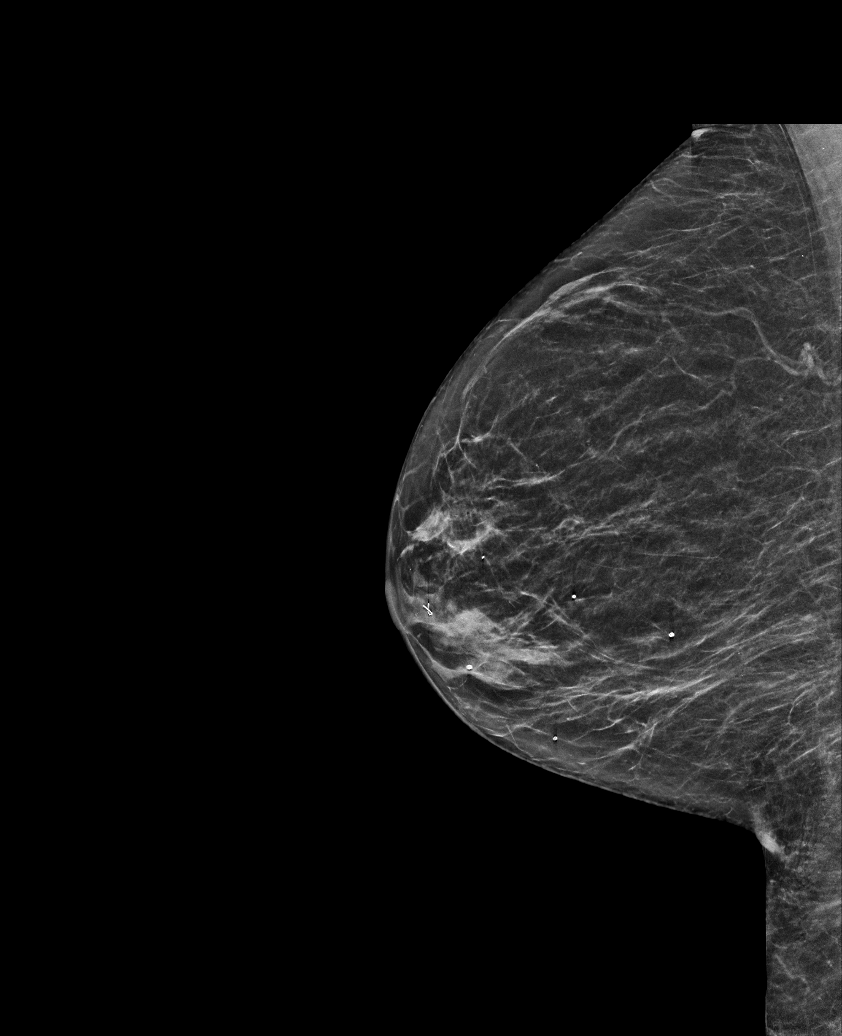

[L MLO synth-2D (1 of 2)]
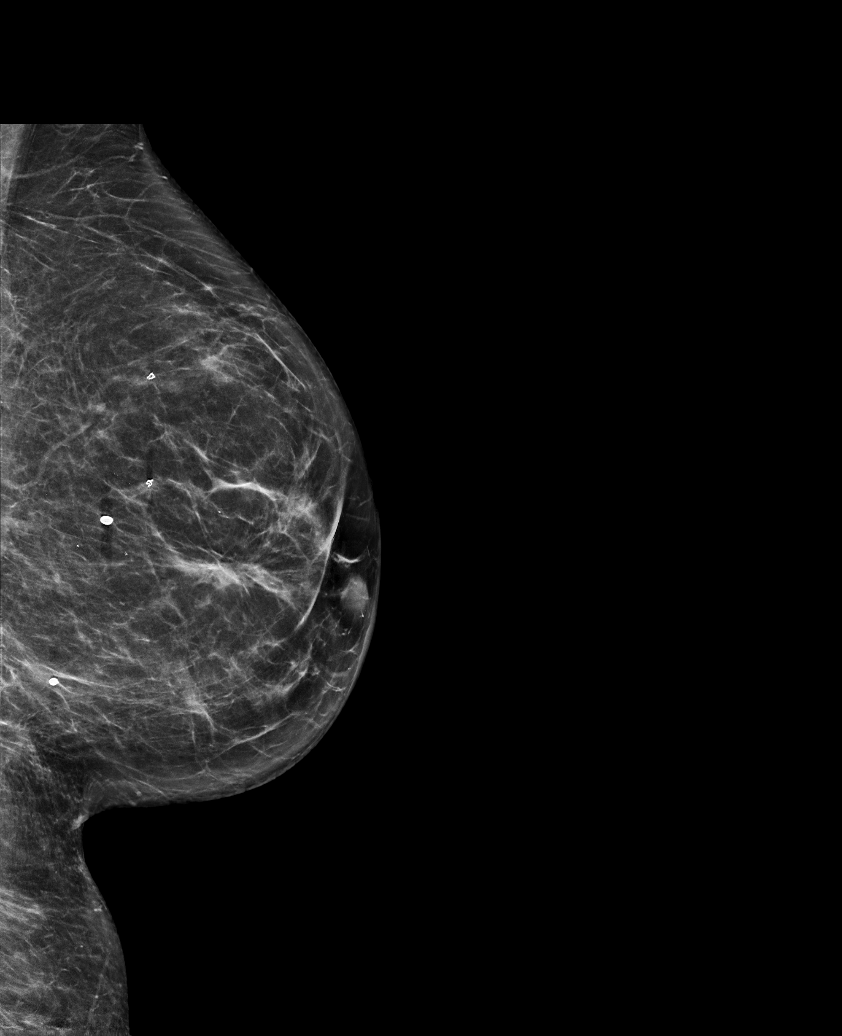

[R CC synth-2D]
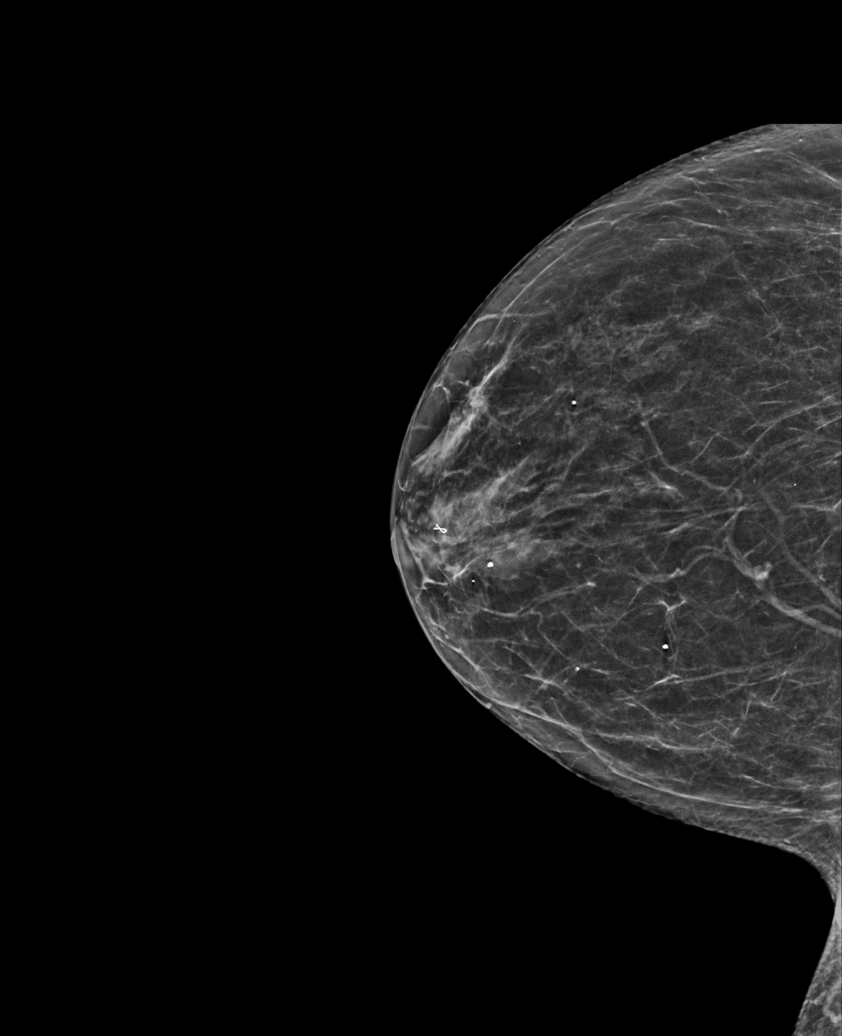

[R MLO synth-2D (2 of 2)]
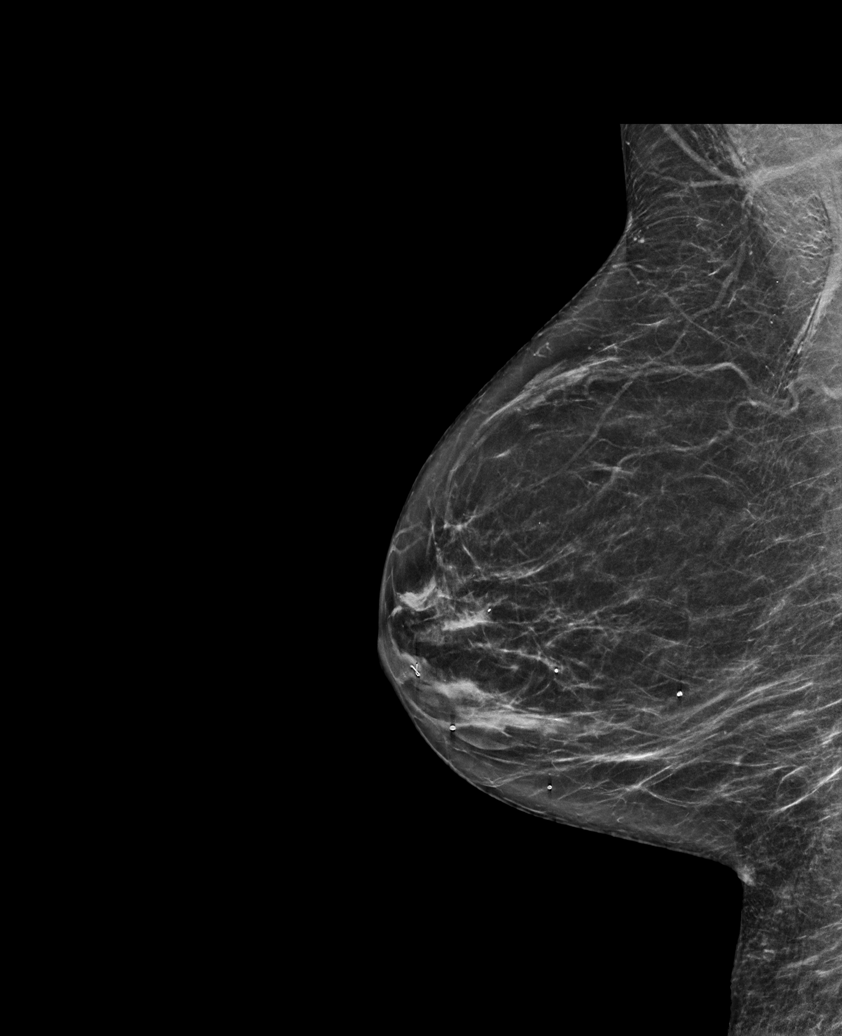

[L MLO synth-2D (2 of 2)]
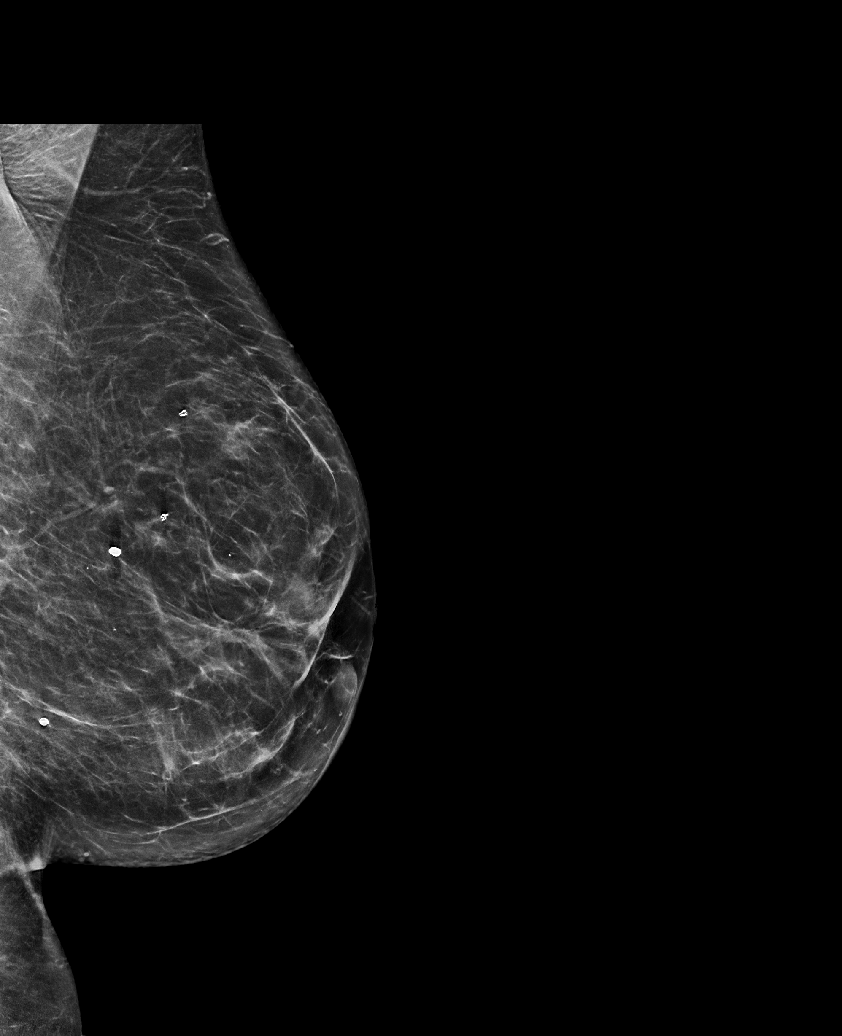

[L CC synth-2D]
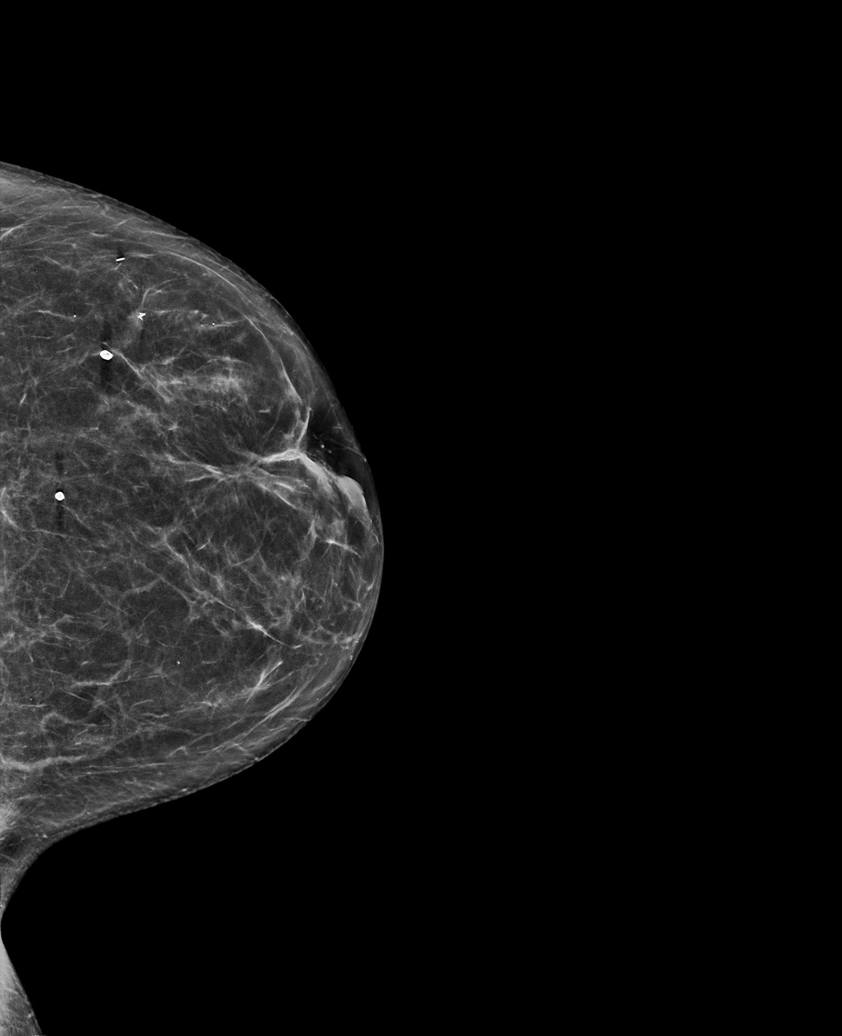

[6 of 36 positions shown; findings below may reference images not displayed]

ACR Breast Density Category b: There are scattered areas of
fibroglandular density.
FINDINGS: There are no findings suspicious for malignancy. Images were
processed with CAD.
IMPRESSION: No mammographic evidence of malignancy. A result letter of this
screening mammogram will be mailed directly to the patient.

RECOMMENDATION:
Screening mammogram in one year. (Code:[TQ])

BI-RADS CATEGORY  1: Negative.

## 2019-07-22 ENCOUNTER — Emergency Department
Admission: EM | Admit: 2019-07-22 | Discharge: 2019-07-22 | Disposition: A | Discharge status: Home / Self Care | Payer: 59 | Attending: Emergency Medicine | Admitting: Emergency Medicine

## 2019-07-22 ENCOUNTER — Emergency Department: Payer: 59

## 2019-07-22 ENCOUNTER — Encounter: Payer: Self-pay | Admitting: Emergency Medicine

## 2019-07-22 ENCOUNTER — Other Ambulatory Visit: Payer: Self-pay

## 2019-07-22 DIAGNOSIS — T7840XA Allergy, unspecified, initial encounter: Secondary | ICD-10-CM | POA: Insufficient documentation

## 2019-07-22 DIAGNOSIS — F17210 Nicotine dependence, cigarettes, uncomplicated: Secondary | ICD-10-CM | POA: Diagnosis not present

## 2019-07-22 DIAGNOSIS — Z853 Personal history of malignant neoplasm of breast: Secondary | ICD-10-CM | POA: Insufficient documentation

## 2019-07-22 DIAGNOSIS — J449 Chronic obstructive pulmonary disease, unspecified: Secondary | ICD-10-CM | POA: Diagnosis not present

## 2019-07-22 LAB — CBC WITH DIFFERENTIAL/PLATELET
Abs Immature Granulocytes: 0.03 10*3/uL (ref 0.00–0.07)
Basophils Absolute: 0.1 10*3/uL (ref 0.0–0.1)
Basophils Relative: 1 %
Eosinophils Absolute: 0.1 10*3/uL (ref 0.0–0.5)
Eosinophils Relative: 1 %
HCT: 43.3 % (ref 36.0–46.0)
Hemoglobin: 14.7 g/dL (ref 12.0–15.0)
Immature Granulocytes: 0 %
Lymphocytes Relative: 27 %
Lymphs Abs: 2.2 10*3/uL (ref 0.7–4.0)
MCH: 30.6 pg (ref 26.0–34.0)
MCHC: 33.9 g/dL (ref 30.0–36.0)
MCV: 90 fL (ref 80.0–100.0)
Monocytes Absolute: 0.6 10*3/uL (ref 0.1–1.0)
Monocytes Relative: 7 %
Neutro Abs: 4.9 10*3/uL (ref 1.7–7.7)
Neutrophils Relative %: 64 %
Platelets: 267 10*3/uL (ref 150–400)
RBC: 4.81 MIL/uL (ref 3.87–5.11)
RDW: 13.1 % (ref 11.5–15.5)
WBC: 7.8 10*3/uL (ref 4.0–10.5)
nRBC: 0 % (ref 0.0–0.2)

## 2019-07-22 LAB — COMPREHENSIVE METABOLIC PANEL
ALT: 13 U/L (ref 0–44)
AST: 17 U/L (ref 15–41)
Albumin: 4.4 g/dL (ref 3.5–5.0)
Alkaline Phosphatase: 56 U/L (ref 38–126)
Anion gap: 9 (ref 5–15)
BUN: 9 mg/dL (ref 6–20)
CO2: 27 mmol/L (ref 22–32)
Calcium: 9.1 mg/dL (ref 8.9–10.3)
Chloride: 96 mmol/L — ABNORMAL LOW (ref 98–111)
Creatinine, Ser: 0.51 mg/dL (ref 0.44–1.00)
GFR calc Af Amer: >60 mL/min (ref >60)
GFR calc non Af Amer: >60 mL/min (ref >60)
Glucose, Bld: 99 mg/dL (ref 70–99)
Potassium: 4.3 mmol/L (ref 3.5–5.1)
Sodium: 132 mmol/L — ABNORMAL LOW (ref 135–145)
Total Bilirubin: 0.5 mg/dL (ref 0.3–1.2)
Total Protein: 7.5 g/dL (ref 6.5–8.1)

## 2019-07-22 MED ORDER — DIPHENHYDRAMINE HCL 50 MG/ML IJ SOLN
50.0000 mg | Freq: Once | INTRAMUSCULAR | Status: AC
  Administered 2019-07-22: 50 mg via INTRAMUSCULAR
  Filled 2019-07-22: qty 1

## 2019-07-22 MED ORDER — METHYLPREDNISOLONE 4 MG PO TBPK: ORAL_TABLET | ORAL | 0 refills | Status: DC

## 2019-07-22 MED ORDER — DEXAMETHASONE SODIUM PHOSPHATE 10 MG/ML IJ SOLN
10.0000 mg | Freq: Once | INTRAMUSCULAR | Status: AC
  Administered 2019-07-22: 10 mg via INTRAMUSCULAR
  Filled 2019-07-22: qty 1

## 2019-07-22 MED ORDER — DIPHENHYDRAMINE HCL 25 MG PO TABS: 25.0000 mg | ORAL_TABLET | Freq: Four times a day (QID) | ORAL | 0 refills | Status: DC | PRN

## 2019-07-22 MED ORDER — HYDROCORTISONE VALERATE 0.2 % EX OINT: TOPICAL_OINTMENT | CUTANEOUS | 1 refills | Status: DC

## 2019-07-22 NOTE — ED Triage Notes (Signed)
Pt states exposure to air fresheners about two weeks ago which she is allergic to. Went to doctor and got cream for this allergy for 'bug bites' and is still feeling itchy, pain in neck to tailbone, itchy eyes, tingly mouth, and shob. Denies trouble swallowing and eating and current shob.

## 2019-07-22 NOTE — ED Notes (Signed)
See triage note  Presents with possible allergic reaction   States sx's started couple of weeks ago  States she began to itch   States she developed this after using air fresheners  States she is just itching all over

## 2019-07-22 NOTE — ED Provider Notes (Signed)
[facility] Emergency Department Provider Note   ____________________________________________   First MD Initiated Contact with Patient 07/22/19 1411     (approximate)  I have reviewed the triage vital signs and the nursing notes.   HISTORY  Chief Complaint Allergic Reaction    HPI Colleen [name] is a 58 y.o. female patient complaining of itching and a rash secondary to to suspected irritants.  Patient states she was exposed to air fresheners in her new place of residence and also her neighbor is using a daily mosquito aerosol.  Patient states this aggravates her COPD.  Patient states she was given a cream for her rash which only gave her transient relief.  Patient rates the discomfort as a 10/10.  Patient describes discomfort as "itchy".      Past Medical History:  Diagnosis Date   Asthma    Breast cancer ([place]) 2004   left breast   COPD (chronic obstructive pulmonary disease) ([place])    Personal history of radiation therapy     Patient Active Problem List   Diagnosis Date Noted   Acute respiratory failure ([place])    Sepsis ([place]) 10/11/2018   CAP (community acquired pneumonia) 10/11/2018   COPD (chronic obstructive pulmonary disease) ([place]) 10/11/2018   GAD (generalized anxiety disorder) 10/11/2018    Past Surgical History:  Procedure Laterality Date   BREAST BIOPSY Left 2004   positive   BREAST BIOPSY Right    neg   BREAST LUMPECTOMY Left 2004   positive   MASTECTOMY Left    MASTECTOMY     NEPHRECTOMY Left    TUBAL LIGATION      Prior to Admission medications   Medication Sig Start Date End Date Taking? Authorizing Provider  BREO ELLIPTA 100-25 MCG/INH AEPB Inhale 1 puff into the lungs daily as needed. 07/21/18   [provider]  cetirizine-pseudoephedrine (ZYRTEC-D) 5-120 MG tablet Take 1 tablet by mouth 2 (two) times daily as needed for allergies.    [provider]  clonazePAM (KLONOPIN) 0.5 MG  disintegrating tablet Take 0.5 mg by mouth every evening. 07/21/18   [provider]  diphenhydrAMINE (BENADRYL ALLERGY) 25 MG tablet Take 1 tablet (25 mg total) by mouth every 6 (six) hours as needed. 07/22/19   Sable Feil, PA-C  loratadine (CLARITIN) 10 MG tablet Take 10 mg by mouth daily. 07/06/18   [provider]  methylPREDNISolone (MEDROL DOSEPAK) 4 MG TBPK tablet Take Tapered dose as directed 07/22/19   Sable Feil, PA-C  pregabalin (LYRICA) 75 MG capsule Take 75 mg by mouth 2 (two) times daily.    [provider]  VENTOLIN HFA 108 (90 Base) MCG/ACT inhaler Inhale 1-2 puffs into the lungs every 4 (four) hours as needed for cough or wheezing. 10/02/18   [provider]    Allergies Morphine and related, Penicillins, Tamoxifen, Hydroxyzine, and Sulfa antibiotics  Family History  Problem Relation Age of Onset   Breast cancer Cousin     Social History Social History   Tobacco Use   Smoking status: Current Every Day Smoker   Smokeless tobacco: Current User  Substance Use Topics   Alcohol use: Not Currently   Drug use: Not on file    Review of Systems Constitutional: No fever/chills Eyes: No visual changes. ENT: No sore throat. Cardiovascular: Denies chest pain. Respiratory: Denies shortness of breath.  COPD and asthma. Gastrointestinal: No abdominal pain.  No nausea, no vomiting.  No diarrhea.  No constipation. Genitourinary: Negative for  dysuria. Musculoskeletal: Negative for back pain. Skin: Positive for rash. Neurological: Negative for headaches, focal weakness or numbness. Allergic/Immunilogical: See extensive medication list. ____________________________________________   PHYSICAL EXAM:  VITAL SIGNS: ED Triage Vitals  Enc Vitals Group     BP 07/22/19 1348 109/90     Pulse Rate 07/22/19 1348 (!) 101     Resp 07/22/19 1348 18     Temp 07/22/19 1348 98.5 F (36.9 C)     Temp Source 07/22/19 1348 Oral     SpO2  07/22/19 1348 92 %     Weight 07/22/19 1344 135 lb (61.2 kg)     Height 07/22/19 1344 5' (1.524 m)     Head Circumference --      Peak Flow --      Pain Score 07/22/19 1343 10     Pain Loc --      Pain Edu? --      Excl. in [place]? --    Constitutional: Alert and oriented. Well appearing and in no acute distress. Neck: No stridor.   Hematological/Lymphatic/Immunilogical: No cervical lymphadenopathy. Cardiovascular: Normal rate, regular rhythm. Grossly normal heart sounds.  Good peripheral circulation. Respiratory: Normal respiratory effort.  No retractions. Lungs CTAB. Neurologic:  Normal speech and language. No gross focal neurologic deficits are appreciated. No gait instability. Skin:  Skin is warm, dry and intact.  Diffuse macular/ papular lesions Psychiatric: Mood and affect are normal. Speech and behavior are normal.  ____________________________________________   LABS (all labs ordered are listed, but only abnormal results are displayed)  Labs Reviewed  COMPREHENSIVE METABOLIC PANEL - Abnormal; Notable for the following components:      Result Value   Sodium 132 (*)    Chloride 96 (*)    All other components within normal limits  CBC WITH DIFFERENTIAL/PLATELET   ____________________________________________  EKG   ____________________________________________  RADIOLOGY  ED MD interpretation:    Official radiology report(s): Dg Chest 2 View  Result Date: 07/22/2019 CLINICAL DATA:  Chronic cough worse over the last 2 weeks EXAM: CHEST - 2 VIEW COMPARISON:  10/13/2018 FINDINGS: The lungs are hyperinflated likely secondary to COPD. There is no focal consolidation. There is no pleural effusion or pneumothorax. The heart and mediastinal contours are unremarkable. There is no acute osseous abnormality. IMPRESSION: No active cardiopulmonary disease. Electronically Signed   By: Kathreen Devoid   On: 07/22/2019 14:39     ____________________________________________   PROCEDURES  Procedure(s) performed (including Critical Care):  Procedures   ____________________________________________   INITIAL IMPRESSION / ASSESSMENT AND PLAN / ED COURSE  As part of my medical decision making, I reviewed the following data within the [place] was evaluated in Emergency Department on 07/22/2019 for the symptoms described in the history of present illness. She was evaluated in the context of the global COVID-19 pandemic, which necessitated consideration that the patient might be at risk for infection with the SARS-CoV-2 virus that causes COVID-19. Institutional protocols and algorithms that pertain to the evaluation of patients at risk for COVID-19 are in a state of rapid change based on information released by regulatory bodies including the CDC and federal and state organizations. These policies and algorithms were followed during the patient's care in the ED.    Patient presents with diffuse rash for 2 weeks.  Physical exam is consistent with contact dermatitis.  Discussed chest x-rays confirm  hyper- inflammation secondary to COPD.  Patient will be consulted to dermatology.  Patient given discharge care instructions a prescription for Medrol dose pack and Benadryl.  Patient was given Decadron and Benadryl prior to departure.      ____________________________________________   FINAL CLINICAL IMPRESSION(S) / ED DIAGNOSES  Final diagnoses:  Allergic reaction, initial encounter     ED Discharge Orders         Ordered    methylPREDNISolone (MEDROL DOSEPAK) 4 MG TBPK tablet     07/22/19 1459    diphenhydrAMINE (BENADRYL ALLERGY) 25 MG tablet  Every 6 hours PRN     07/22/19 1459           Note:  This document was prepared using Dragon voice recognition software and may include unintentional dictation errors.    Sable Feil, PA-C 07/22/19 1508     Harvest Dark, MD 07/23/19 1332

## 2019-11-18 ENCOUNTER — Emergency Department
Admission: EM | Admit: 2019-11-18 | Discharge: 2019-11-18 | Disposition: A | Discharge status: Home / Self Care | Payer: 59 | Attending: Student in an Organized Health Care Education/Training Program | Admitting: Student in an Organized Health Care Education/Training Program

## 2019-11-18 ENCOUNTER — Encounter: Payer: Self-pay | Admitting: Intensive Care

## 2019-11-18 ENCOUNTER — Emergency Department: Payer: 59

## 2019-11-18 ENCOUNTER — Other Ambulatory Visit: Payer: Self-pay

## 2019-11-18 DIAGNOSIS — Z853 Personal history of malignant neoplasm of breast: Secondary | ICD-10-CM | POA: Insufficient documentation

## 2019-11-18 DIAGNOSIS — R0981 Nasal congestion: Secondary | ICD-10-CM | POA: Diagnosis not present

## 2019-11-18 DIAGNOSIS — T7840XA Allergy, unspecified, initial encounter: Secondary | ICD-10-CM | POA: Diagnosis not present

## 2019-11-18 DIAGNOSIS — J45909 Unspecified asthma, uncomplicated: Secondary | ICD-10-CM | POA: Insufficient documentation

## 2019-11-18 DIAGNOSIS — J449 Chronic obstructive pulmonary disease, unspecified: Secondary | ICD-10-CM | POA: Insufficient documentation

## 2019-11-18 DIAGNOSIS — F17210 Nicotine dependence, cigarettes, uncomplicated: Secondary | ICD-10-CM | POA: Diagnosis not present

## 2019-11-18 DIAGNOSIS — R21 Rash and other nonspecific skin eruption: Secondary | ICD-10-CM | POA: Diagnosis present

## 2019-11-18 HISTORY — DX: Anxiety disorder, unspecified: F41.9

## 2019-11-18 HISTORY — DX: Depression, unspecified: F32.A

## 2019-11-18 HISTORY — DX: Other chronic pain: G89.29

## 2019-11-18 HISTORY — DX: Dorsalgia, unspecified: M54.9

## 2019-11-18 LAB — CBC WITH DIFFERENTIAL/PLATELET
Abs Immature Granulocytes: 0.04 10*3/uL (ref 0.00–0.07)
Basophils Absolute: 0.1 10*3/uL (ref 0.0–0.1)
Basophils Relative: 1 %
Eosinophils Absolute: 0.2 10*3/uL (ref 0.0–0.5)
Eosinophils Relative: 2 %
HCT: 45.4 % (ref 36.0–46.0)
Hemoglobin: 14.4 g/dL (ref 12.0–15.0)
Immature Granulocytes: 0 %
Lymphocytes Relative: 24 %
Lymphs Abs: 2.3 10*3/uL (ref 0.7–4.0)
MCH: 29.4 pg (ref 26.0–34.0)
MCHC: 31.7 g/dL (ref 30.0–36.0)
MCV: 92.7 fL (ref 80.0–100.0)
Monocytes Absolute: 0.6 10*3/uL (ref 0.1–1.0)
Monocytes Relative: 6 %
Neutro Abs: 6.3 10*3/uL (ref 1.7–7.7)
Neutrophils Relative %: 67 %
Platelets: 289 10*3/uL (ref 150–400)
RBC: 4.9 MIL/uL (ref 3.87–5.11)
RDW: 13 % (ref 11.5–15.5)
WBC: 9.4 10*3/uL (ref 4.0–10.5)
nRBC: 0 % (ref 0.0–0.2)

## 2019-11-18 LAB — COMPREHENSIVE METABOLIC PANEL
ALT: 14 U/L (ref 0–44)
AST: 7 U/L — ABNORMAL LOW (ref 15–41)
Albumin: 4 g/dL (ref 3.5–5.0)
Alkaline Phosphatase: 50 U/L (ref 38–126)
Anion gap: 8 (ref 5–15)
BUN: 13 mg/dL (ref 6–20)
CO2: 29 mmol/L (ref 22–32)
Calcium: 9.1 mg/dL (ref 8.9–10.3)
Chloride: 101 mmol/L (ref 98–111)
Creatinine, Ser: 0.63 mg/dL (ref 0.44–1.00)
GFR calc Af Amer: >60 mL/min (ref >60)
GFR calc non Af Amer: >60 mL/min (ref >60)
Glucose, Bld: 121 mg/dL — ABNORMAL HIGH (ref 70–99)
Potassium: 3.6 mmol/L (ref 3.5–5.1)
Sodium: 138 mmol/L (ref 135–145)
Total Bilirubin: 0.4 mg/dL (ref 0.3–1.2)
Total Protein: 7 g/dL (ref 6.5–8.1)

## 2019-11-18 IMAGING — CR DG CHEST 2V
1 series · 2 of 2 positions shown · non-contrast
Comparison: [DATE]

CLINICAL DATA: Cough and shortness of breath

EXAM:
CHEST - 2 VIEW

[Series 1: dg chest 2 view · 0.14mm/px · 2 of 2 slices shown]
[im 1/2]
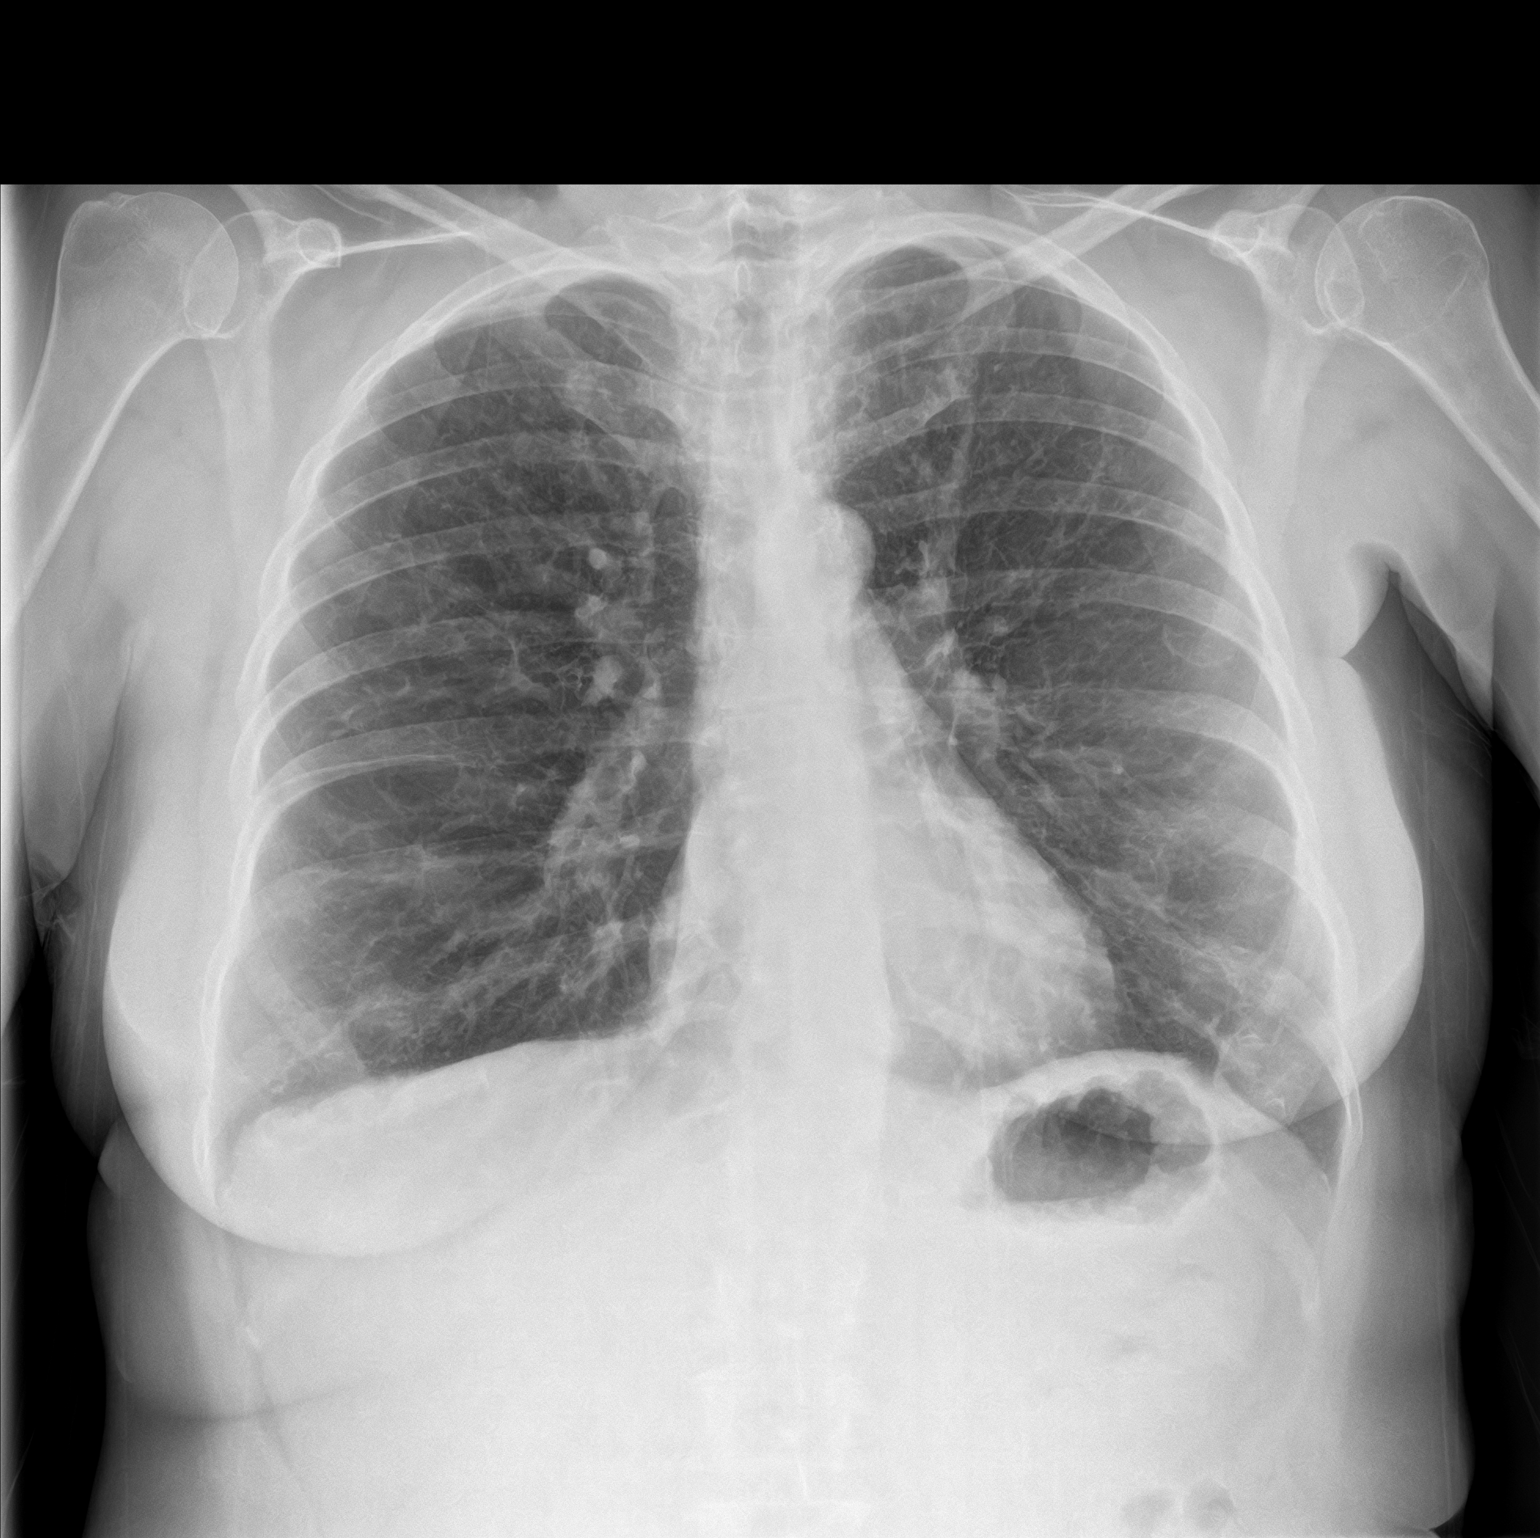
[im 2/2]
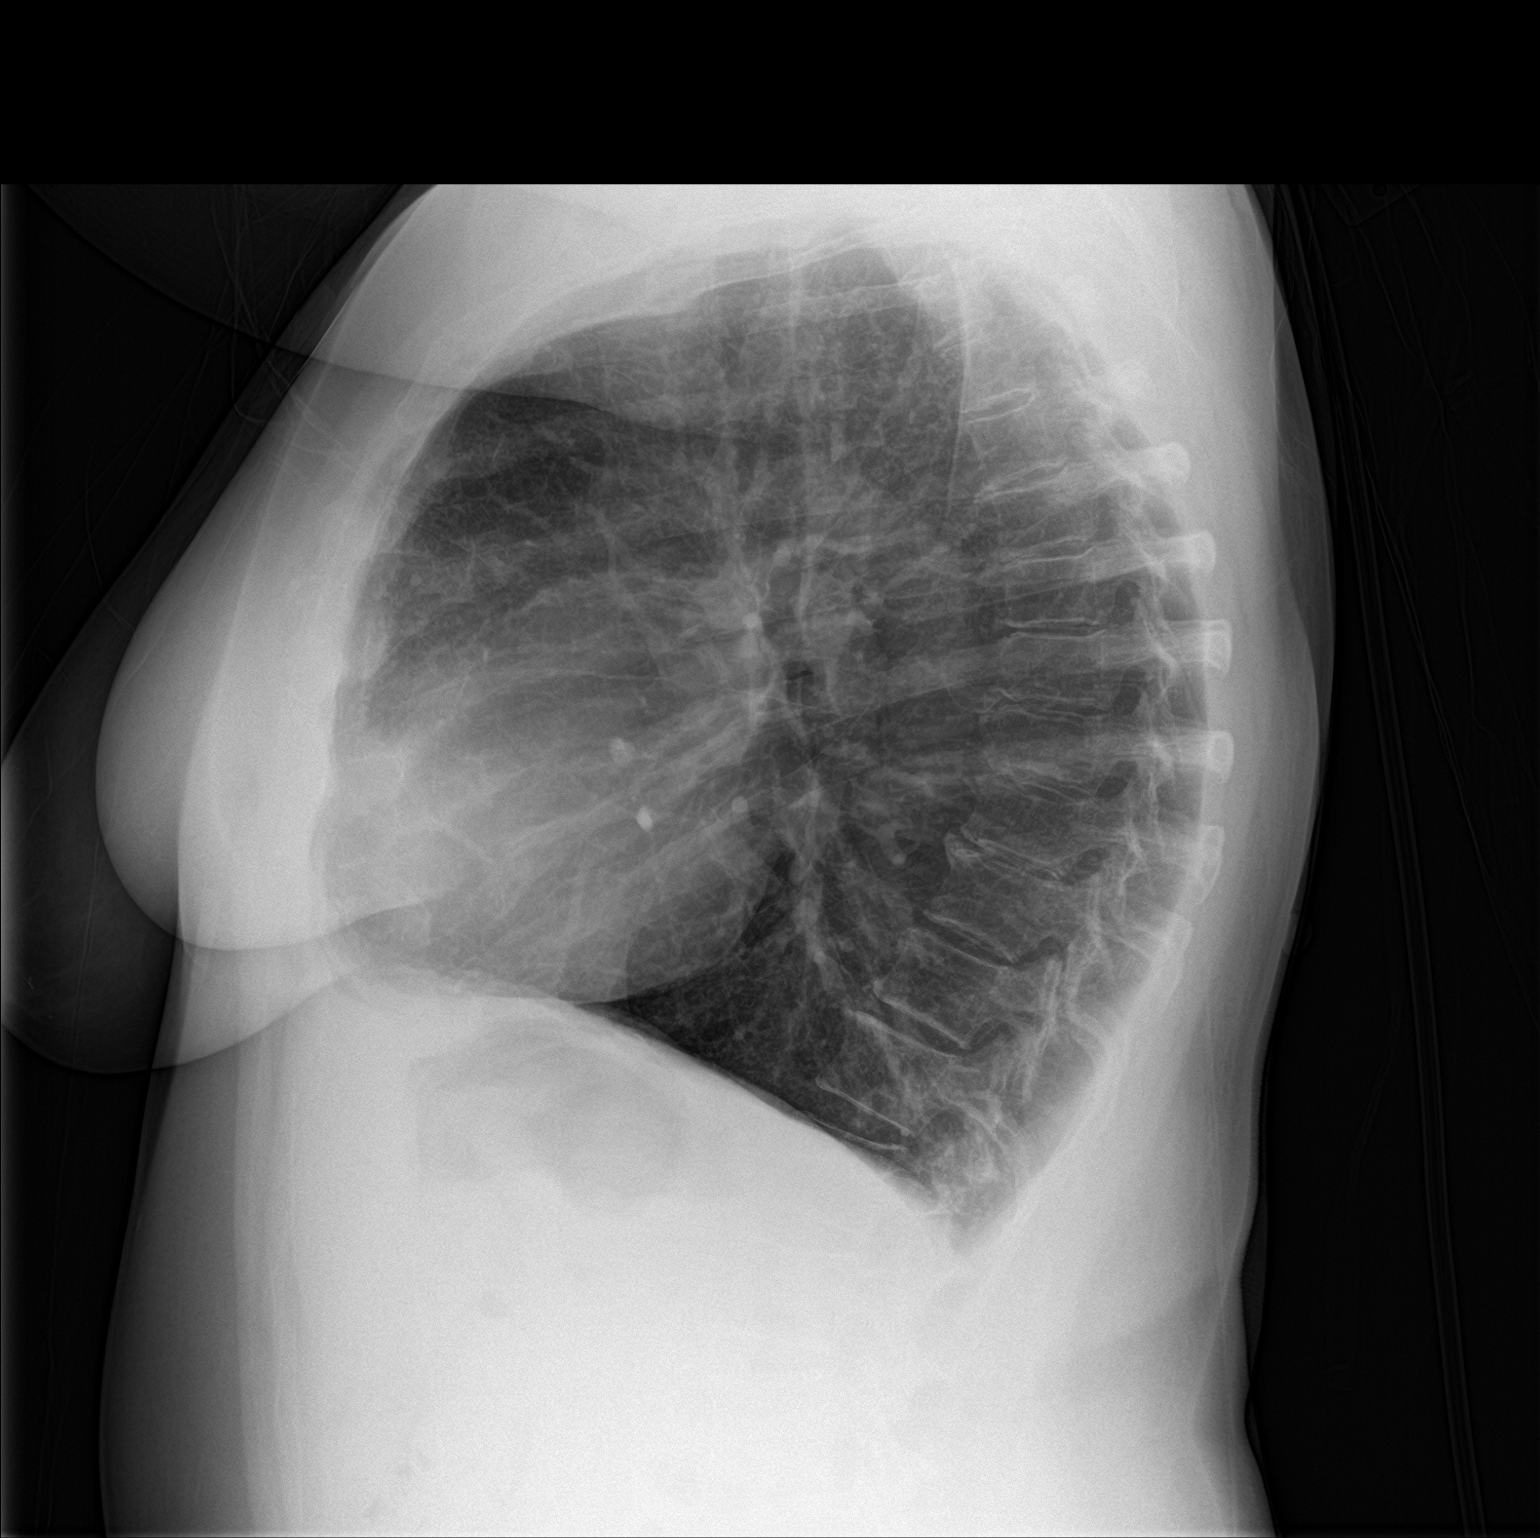

[2 of 2 positions shown; findings below may reference images not displayed]

FINDINGS: There are scattered areas of apparent scarring bilaterally. Lungs
are mildly hyperexpanded. There is no appreciable edema or
consolidation. Heart size and pulmonary vascularity are normal. No
adenopathy. No pneumothorax. No bone lesions.
IMPRESSION: Lungs mildly hyperexpanded with scattered areas of scarring. No
edema or consolidation. Cardiac silhouette normal. No adenopathy.

## 2019-11-18 MED ORDER — PREDNISONE 10 MG PO TABS: 10.0000 mg | ORAL_TABLET | Freq: Every day | ORAL | 0 refills | Status: DC

## 2019-11-18 MED ORDER — PREGABALIN 75 MG PO CAPS: 75.0000 mg | ORAL_CAPSULE | Freq: Once | ORAL | Status: DC

## 2019-11-18 MED ORDER — AZITHROMYCIN 500 MG PO TABS: 500.0000 mg | ORAL_TABLET | Freq: Every day | ORAL | 0 refills | Status: DC

## 2019-11-18 MED ORDER — AZITHROMYCIN 500 MG PO TABS: 500.0000 mg | ORAL_TABLET | Freq: Every day | ORAL | 0 refills | Status: AC

## 2019-11-18 NOTE — ED Provider Notes (Addendum)
[facility] Emergency Department Provider Note    First MD Initiated Contact with Patient 11/18/19 1427     (approximate)  I have reviewed the triage vital signs and the nursing notes.   HISTORY  Chief Complaint Allergic Reaction    HPI Colleen [name] is a 59 y.o. female [place] past medical history presents to the ER for evaluation of several days weeks of intermittent nasal congestion intermittent rash.  Patient on multiple complaints stating that the symptoms have been ongoing for quite some time she has a history of allergic reactions.  She denies any shortness of breath at this time.  No chest pain.  No fevers.  States that she been isolated at home.  Denies any exposures to Covid.  Patient came in today primarily because she felt like her eyes were swollen this morning and wanted to be evaluated.  States that those symptoms have gotten better.  She not having blurry vision.  States that she does have some dry eyes and has OTC eyedrops at home but has not been using them.  Triage note reported patient complaining of shortness of breath but she states that she simply had trouble breathing through her nose.  Does not feel SOB at this time.  Wears 2L [place] at home as needed and at night.  Does use inhalers.  Does not feel SOB or chest pain.  Is complaining of back ache and states that she didn't take her morning lyrica.     Past Medical History:  Diagnosis Date  . Anxiety   . Asthma   . Breast cancer ([place]) 2004   left breast  . Chronic back pain   . COPD (chronic obstructive pulmonary disease) ([place])   . Depression   . Personal history of radiation therapy    Family History  Problem Relation Age of Onset  . Breast cancer Cousin    Past Surgical History:  Procedure Laterality Date  . BREAST BIOPSY Left 2004   positive  . BREAST BIOPSY Right    neg  . BREAST LUMPECTOMY Left 2004   positive  . MASTECTOMY Left   . MASTECTOMY    . NEPHRECTOMY Left   .  TUBAL LIGATION     Patient Active Problem List   Diagnosis Date Noted  . Acute respiratory failure ([place])   . Sepsis ([place]) 10/11/2018  . CAP (community acquired pneumonia) 10/11/2018  . COPD (chronic obstructive pulmonary disease) ([place]) 10/11/2018  . GAD (generalized anxiety disorder) 10/11/2018      Prior to Admission medications   Medication Sig Start Date End Date Taking? Authorizing Provider  azithromycin (ZITHROMAX) 500 MG tablet Take 1 tablet (500 mg total) by mouth daily for 3 days. Take 1 tablet daily for 3 days. 11/18/19 11/21/19  Merlyn Lot, MD  BREO ELLIPTA 100-25 MCG/INH AEPB Inhale 1 puff into the lungs daily as needed. 07/21/18   [provider]  cetirizine-pseudoephedrine (ZYRTEC-D) 5-120 MG tablet Take 1 tablet by mouth 2 (two) times daily as needed for allergies.    [provider]  clonazePAM (KLONOPIN) 0.5 MG disintegrating tablet Take 0.5 mg by mouth every evening. 07/21/18   [provider]  diphenhydrAMINE (BENADRYL ALLERGY) 25 MG tablet Take 1 tablet (25 mg total) by mouth every 6 (six) hours as needed. 07/22/19   Sable Feil, PA-C  hydrocortisone valerate ointment (WESTCORT) 0.2 % Apply to affected area daily 07/22/19 07/21/20  Sable Feil, PA-C  loratadine (CLARITIN) 10 MG tablet Take  10 mg by mouth daily. 07/06/18   [provider]  methylPREDNISolone (MEDROL DOSEPAK) 4 MG TBPK tablet Take Tapered dose as directed 07/22/19   Sable Feil, PA-C  predniSONE (DELTASONE) 10 MG tablet Take 1 tablet (10 mg total) by mouth daily. Day 1-2: Take 50 mg  Day 3-4: Take 40 mg  Day 5-6: Take 30mg   Day7-8:  Take 20 mg Day9:  Take 10 11/18/19   Merlyn Lot, MD  pregabalin (LYRICA) 75 MG capsule Take 75 mg by mouth 2 (two) times daily.    [provider]  VENTOLIN HFA 108 (90 Base) MCG/ACT inhaler Inhale 1-2 puffs into the lungs every 4 (four) hours as needed for cough or wheezing. 10/02/18   [provider]     Allergies Morphine and related, Penicillins, Tamoxifen, Clonazepam, Hydroxyzine, and Sulfa antibiotics    Social History Social History   Tobacco Use  . Smoking status: Current Every Day Smoker    Packs/day: 0.50    Types: Cigarettes  . Smokeless tobacco: Never Used  Substance Use Topics  . Alcohol use: Not Currently  . Drug use: Never    Review of Systems Patient denies headaches, rhinorrhea, blurry vision, numbness, shortness of breath, chest pain, edema, cough, abdominal pain, nausea, vomiting, diarrhea, dysuria, fevers, rashes or hallucinations unless otherwise stated above in HPI. ____________________________________________   PHYSICAL EXAM:  VITAL SIGNS: Vitals:   11/18/19 1236  BP: (!) 148/91  Pulse: (!) 109  Resp: 14  Temp: 99.2 F (37.3 C)  SpO2: 98%    Constitutional: Alert and oriented.  Eyes: Conjunctivae are normal.  Head: Atraumatic. Nose: clear nasal congestion, no purulence, no bleeding Mouth/Throat: Mucous membranes are moist.   Neck: No stridor. Painless ROM.  Cardiovascular: Normal rate, regular rhythm. Grossly normal heart sounds.  Good peripheral circulation. Respiratory: Normal respiratory effort.  Speaking in complete sentences.  No retractions. Lungs with scattered wheeze Gastrointestinal: Soft and nontender. No distention. No abdominal bruits. No CVA tenderness. Genitourinary:  Musculoskeletal: No lower extremity tenderness nor edema.  No joint effusions. Neurologic:  Normal speech and language. No gross focal neurologic deficits are appreciated. No facial droop Skin:  Skin is warm, dry and intact. No rash noted. Psychiatric: Mood and affect are normal. Speech and behavior are normal.  ____________________________________________   LABS (all labs ordered are listed, but only abnormal results are displayed)  Results for orders placed or performed during the hospital encounter of 11/18/19 (from the past 24 hour(s))  CBC with  Differential     Status: None   Collection Time: 11/18/19 12:45 PM  Result Value Ref Range   WBC 9.4 4.0 - 10.5 K/uL   RBC 4.90 3.87 - 5.11 MIL/uL   Hemoglobin 14.4 12.0 - 15.0 g/dL   HCT 45.4 36.0 - 46.0 %   MCV 92.7 80.0 - 100.0 fL   MCH 29.4 26.0 - 34.0 pg   MCHC 31.7 30.0 - 36.0 g/dL   RDW 13.0 11.5 - 15.5 %   Platelets 289 150 - 400 K/uL   nRBC 0.0 0.0 - 0.2 %   Neutrophils Relative % 67 %   Neutro Abs 6.3 1.7 - 7.7 K/uL   Lymphocytes Relative 24 %   Lymphs Abs 2.3 0.7 - 4.0 K/uL   Monocytes Relative 6 %   Monocytes Absolute 0.6 0.1 - 1.0 K/uL   Eosinophils Relative 2 %   Eosinophils Absolute 0.2 0.0 - 0.5 K/uL   Basophils Relative 1 %   Basophils Absolute 0.1  0.0 - 0.1 K/uL   Immature Granulocytes 0 %   Abs Immature Granulocytes 0.04 0.00 - 0.07 K/uL  Comprehensive metabolic panel     Status: Abnormal   Collection Time: 11/18/19 12:45 PM  Result Value Ref Range   Sodium 138 135 - 145 mmol/L   Potassium 3.6 3.5 - 5.1 mmol/L   Chloride 101 98 - 111 mmol/L   CO2 29 22 - 32 mmol/L   Glucose, Bld 121 (H) 70 - 99 mg/dL   BUN 13 6 - 20 mg/dL   Creatinine, Ser 0.63 0.44 - 1.00 mg/dL   Calcium 9.1 8.9 - 10.3 mg/dL   Total Protein 7.0 6.5 - 8.1 g/dL   Albumin 4.0 3.5 - 5.0 g/dL   AST 7 (L) 15 - 41 U/L   ALT 14 0 - 44 U/L   Alkaline Phosphatase 50 38 - 126 U/L   Total Bilirubin 0.4 0.3 - 1.2 mg/dL   GFR calc non Af Amer >60 >60 mL/min   GFR calc Af Amer >60 >60 mL/min   Anion gap 8 5 - 15   ____________________________________________  EKG My review and personal interpretation at Time: 12:45   Indication: eye swelling  Rate: 100  Rhythm: sinus Axis: normal Other: no stemi or depressions ____________________________________________  RADIOLOGY  I personally reviewed all radiographic images ordered to evaluate for the above acute complaints and reviewed radiology reports and findings.  These findings were personally discussed with the patient.  Please see medical  record for radiology report.  ____________________________________________   PROCEDURES  Procedure(s) performed:  Procedures    Critical Care performed: no ____________________________________________   INITIAL IMPRESSION / ASSESSMENT AND PLAN / ED COURSE  Pertinent labs & imaging results that were available during my care of the patient were reviewed by me and considered in my medical decision making (see chart for details).   DDX: allergies, rhinitis, sinusitis,  chf, pna, ptx, copd, angioedema  Lennix Laminack is a 59 y.o. who presents to the ED with symptoms as described above.  Patient is exceedingly well-appearing nontoxic.  No respiratory distress.  Blood work is reassuring.  Does have emphysematous changes.  No hypoxia.  She is speaking in complete sentences.  No signs of angioedema or swelling at this time.  Given her history of COPD with possible allergic reaction and also complaining of congestion and allergic will order course of steroid.  She denies any dyspnea and primary concern and complaint was swelling of her eyes that has subsided.  Also concerned about nasal congestion.  Recommended covid testing which she declines.  Does not appear c/w pna, chf, copd, pe or anaphylaxis.  Possibly related to environmental allergies, but given symptoms will treat sinusitis.  Encouraged use of lubricating eyedrops as well as nasal spray which patient agrees to do so. No hypoxia with ambulation.       The patient was evaluated in Emergency Department today for the symptoms described in the history of present illness. He/she was evaluated in the context of the global COVID-19 pandemic, which necessitated consideration that the patient might be at risk for infection with the SARS-CoV-2 virus that causes COVID-19. Institutional protocols and algorithms that pertain to the evaluation of patients at risk for COVID-19 are in a state of rapid change based on information released by regulatory  bodies including the CDC and federal and state organizations. These policies and algorithms were followed during the patient's care in the ED.  As part of my medical decision making, I  reviewed the following data within the [place] notes reviewed and incorporated, Labs reviewed, notes from prior ED visits and [place] Controlled Substance Database   ____________________________________________   FINAL CLINICAL IMPRESSION(S) / ED DIAGNOSES  Final diagnoses:  Allergic reaction, initial encounter  Congestion of nasal sinus      NEW MEDICATIONS STARTED DURING THIS VISIT:  New Prescriptions   AZITHROMYCIN (ZITHROMAX) 500 MG TABLET    Take 1 tablet (500 mg total) by mouth daily for 3 days. Take 1 tablet daily for 3 days.   PREDNISONE (DELTASONE) 10 MG TABLET    Take 1 tablet (10 mg total) by mouth daily. Day 1-2: Take 50 mg  Day 3-4: Take 40 mg  Day 5-6: Take 30mg   Day7-8:  Take 20 mg Day9:  Take 10     Note:  This document was prepared using Dragon voice recognition software and may include unintentional dictation errors.        Merlyn Lot, MD 11/18/19 [phone]

## 2019-11-18 NOTE — ED Triage Notes (Addendum)
First RN Note: Pt presents to ED via POV, states "I have a whole list of things I'm allergic to and I take allergy shots". Pt states swelling to her face and and feeling like she is having difficulty breathing at this time, pt is noted to be able to speak in complete sentences and ambulatory without difficulty. Pt also states wears chronic 2-4L O2, pt also ambulated to the lobby without wearing O2 at this time.

## 2019-11-18 NOTE — ED Triage Notes (Signed)
Patient c/o possible allergic reaction but unsure to what. C/o rash on legs yesterday that has subsided and reports swelling around eyes and cheeks. Patient reports she wears 2L O2 as needed and denies being SOB but feels as if "someone is pinching off my nose" and needs the 2L O2 right now. HX left sided breast cancer 2004.

## 2020-01-11 ENCOUNTER — Other Ambulatory Visit: Payer: Self-pay | Admitting: Family Medicine

## 2020-01-11 DIAGNOSIS — Z1231 Encounter for screening mammogram for malignant neoplasm of breast: Secondary | ICD-10-CM

## 2020-03-20 ENCOUNTER — Observation Stay
Admission: EM | Admit: 2020-03-20 | Discharge: 2020-03-21 | Disposition: A | Discharge status: Home / Self Care | Payer: 59 | Attending: Student | Admitting: Student

## 2020-03-20 ENCOUNTER — Other Ambulatory Visit: Payer: Self-pay

## 2020-03-20 ENCOUNTER — Emergency Department: Payer: 59

## 2020-03-20 DIAGNOSIS — Z20822 Contact with and (suspected) exposure to covid-19: Secondary | ICD-10-CM | POA: Insufficient documentation

## 2020-03-20 DIAGNOSIS — J441 Chronic obstructive pulmonary disease with (acute) exacerbation: Principal | ICD-10-CM | POA: Diagnosis present

## 2020-03-20 DIAGNOSIS — F419 Anxiety disorder, unspecified: Secondary | ICD-10-CM | POA: Insufficient documentation

## 2020-03-20 DIAGNOSIS — J9621 Acute and chronic respiratory failure with hypoxia: Secondary | ICD-10-CM | POA: Diagnosis not present

## 2020-03-20 DIAGNOSIS — Z79899 Other long term (current) drug therapy: Secondary | ICD-10-CM | POA: Diagnosis not present

## 2020-03-20 DIAGNOSIS — R079 Chest pain, unspecified: Secondary | ICD-10-CM | POA: Insufficient documentation

## 2020-03-20 DIAGNOSIS — F410 Panic disorder [episodic paroxysmal anxiety] without agoraphobia: Secondary | ICD-10-CM | POA: Diagnosis not present

## 2020-03-20 DIAGNOSIS — Z7951 Long term (current) use of inhaled steroids: Secondary | ICD-10-CM | POA: Insufficient documentation

## 2020-03-20 DIAGNOSIS — J45901 Unspecified asthma with (acute) exacerbation: Secondary | ICD-10-CM | POA: Diagnosis not present

## 2020-03-20 DIAGNOSIS — Z853 Personal history of malignant neoplasm of breast: Secondary | ICD-10-CM | POA: Diagnosis not present

## 2020-03-20 DIAGNOSIS — J329 Chronic sinusitis, unspecified: Secondary | ICD-10-CM | POA: Diagnosis not present

## 2020-03-20 DIAGNOSIS — G2581 Restless legs syndrome: Secondary | ICD-10-CM | POA: Diagnosis not present

## 2020-03-20 DIAGNOSIS — F411 Generalized anxiety disorder: Secondary | ICD-10-CM | POA: Diagnosis present

## 2020-03-20 DIAGNOSIS — R0602 Shortness of breath: Secondary | ICD-10-CM | POA: Diagnosis present

## 2020-03-20 LAB — CBC
HCT: 42.1 % (ref 36.0–46.0)
Hemoglobin: 14.2 g/dL (ref 12.0–15.0)
MCH: 30.1 pg (ref 26.0–34.0)
MCHC: 33.7 g/dL (ref 30.0–36.0)
MCV: 89.4 fL (ref 80.0–100.0)
Platelets: 239 10*3/uL (ref 150–400)
RBC: 4.71 MIL/uL (ref 3.87–5.11)
RDW: 13.2 % (ref 11.5–15.5)
WBC: 4.8 10*3/uL (ref 4.0–10.5)
nRBC: 0 % (ref 0.0–0.2)

## 2020-03-20 LAB — BRAIN NATRIURETIC PEPTIDE: B Natriuretic Peptide: 34 pg/mL (ref 0.0–100.0)

## 2020-03-20 LAB — SARS CORONAVIRUS 2 BY RT PCR (HOSPITAL ORDER, PERFORMED IN ~~LOC~~ HOSPITAL LAB): SARS Coronavirus 2: NEGATIVE

## 2020-03-20 LAB — PROCALCITONIN: Procalcitonin: <0.1 ng/mL

## 2020-03-20 LAB — BASIC METABOLIC PANEL
Anion gap: 9 (ref 5–15)
BUN: 6 mg/dL (ref 6–20)
CO2: 28 mmol/L (ref 22–32)
Calcium: 8.5 mg/dL — ABNORMAL LOW (ref 8.9–10.3)
Chloride: 94 mmol/L — ABNORMAL LOW (ref 98–111)
Creatinine, Ser: 0.68 mg/dL (ref 0.44–1.00)
GFR calc Af Amer: >60 mL/min (ref >60)
GFR calc non Af Amer: >60 mL/min (ref >60)
Glucose, Bld: 139 mg/dL — ABNORMAL HIGH (ref 70–99)
Potassium: 3.9 mmol/L (ref 3.5–5.1)
Sodium: 131 mmol/L — ABNORMAL LOW (ref 135–145)

## 2020-03-20 LAB — TROPONIN I (HIGH SENSITIVITY)
Troponin I (High Sensitivity): 3 ng/L (ref <18)
Troponin I (High Sensitivity): 3 ng/L (ref <18)

## 2020-03-20 IMAGING — CR DG CHEST 2V
2 series · 2 of 2 positions shown · non-contrast
Comparison: [DATE]

CLINICAL DATA: Shortness of breath

EXAM:
CHEST - 2 VIEW

[chest pa]
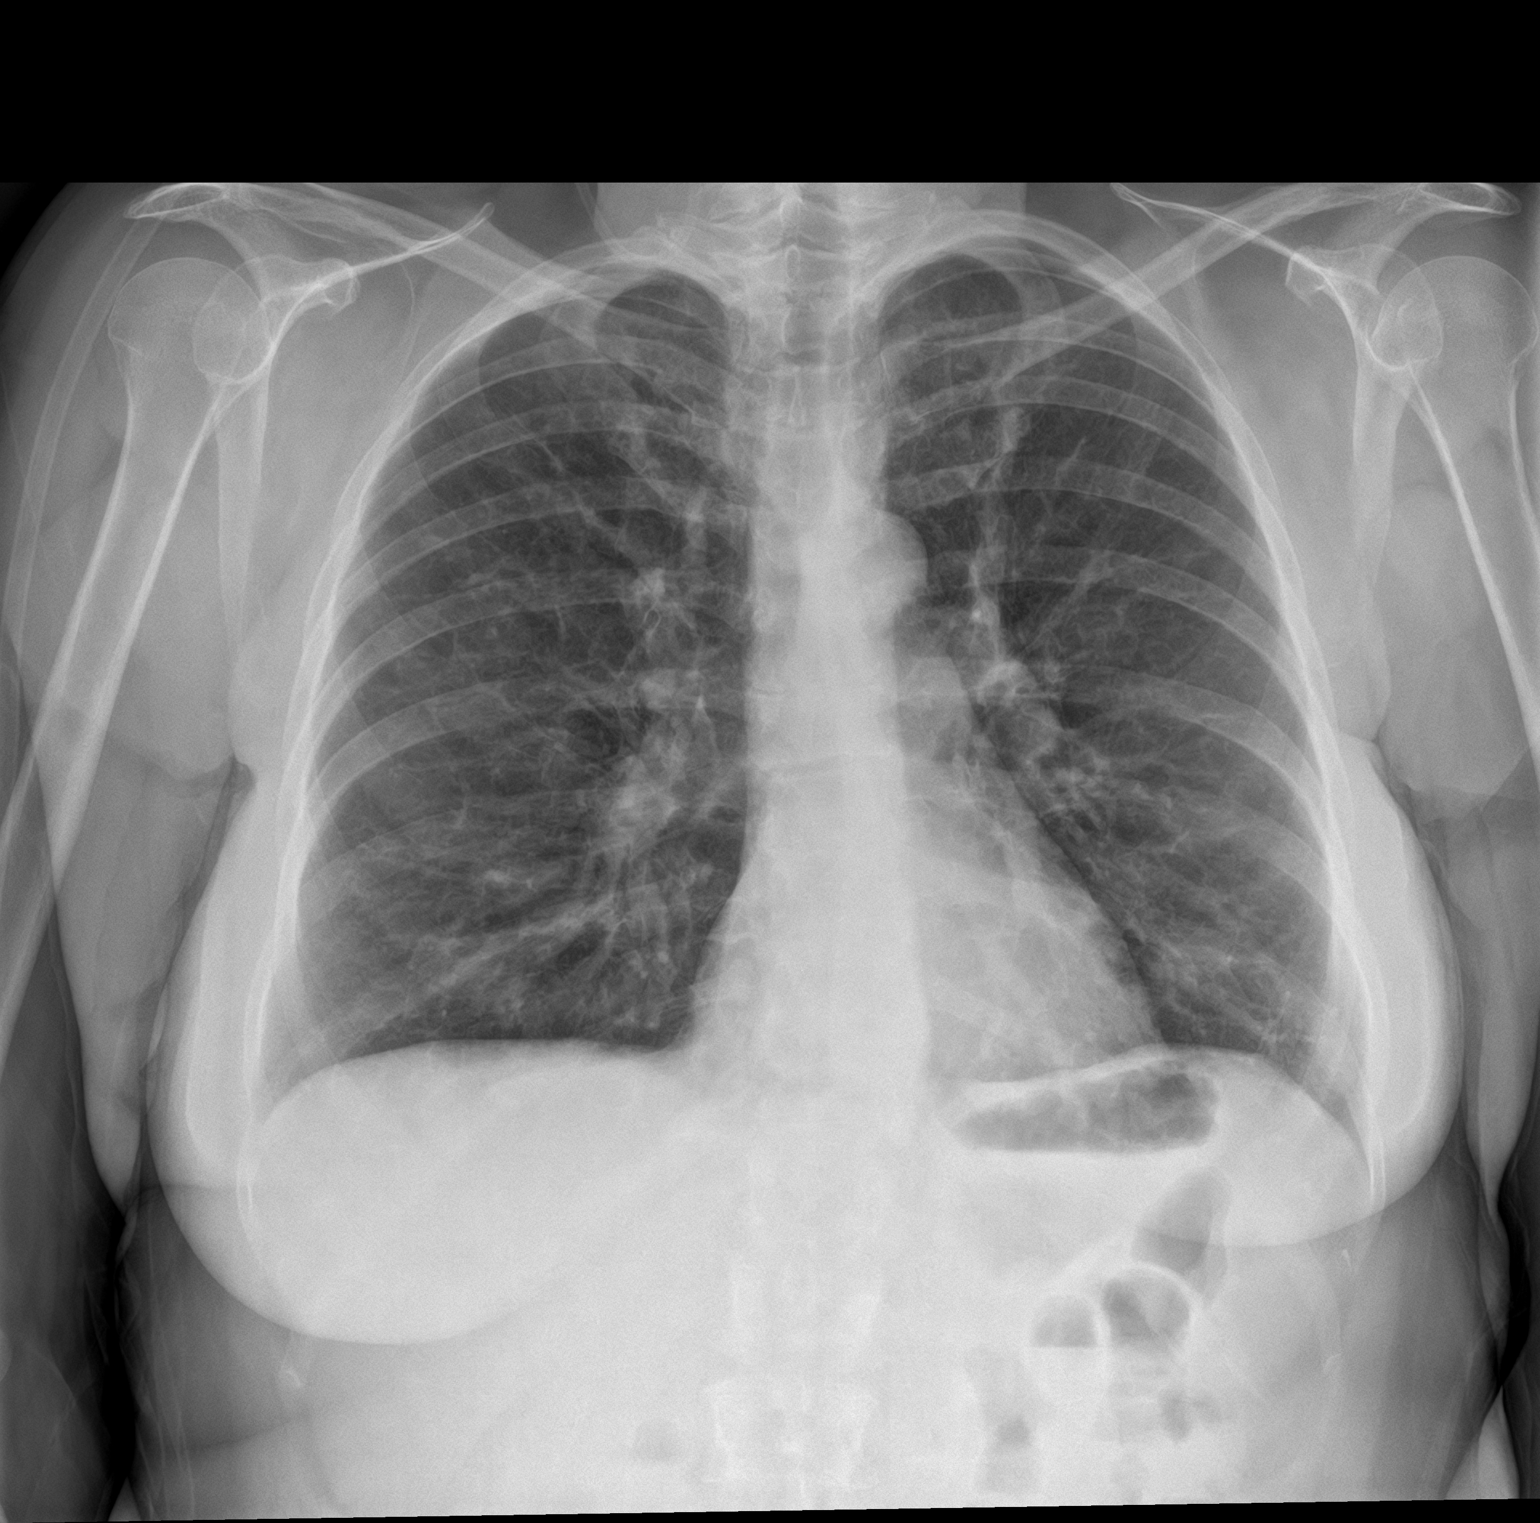

[chest lat]
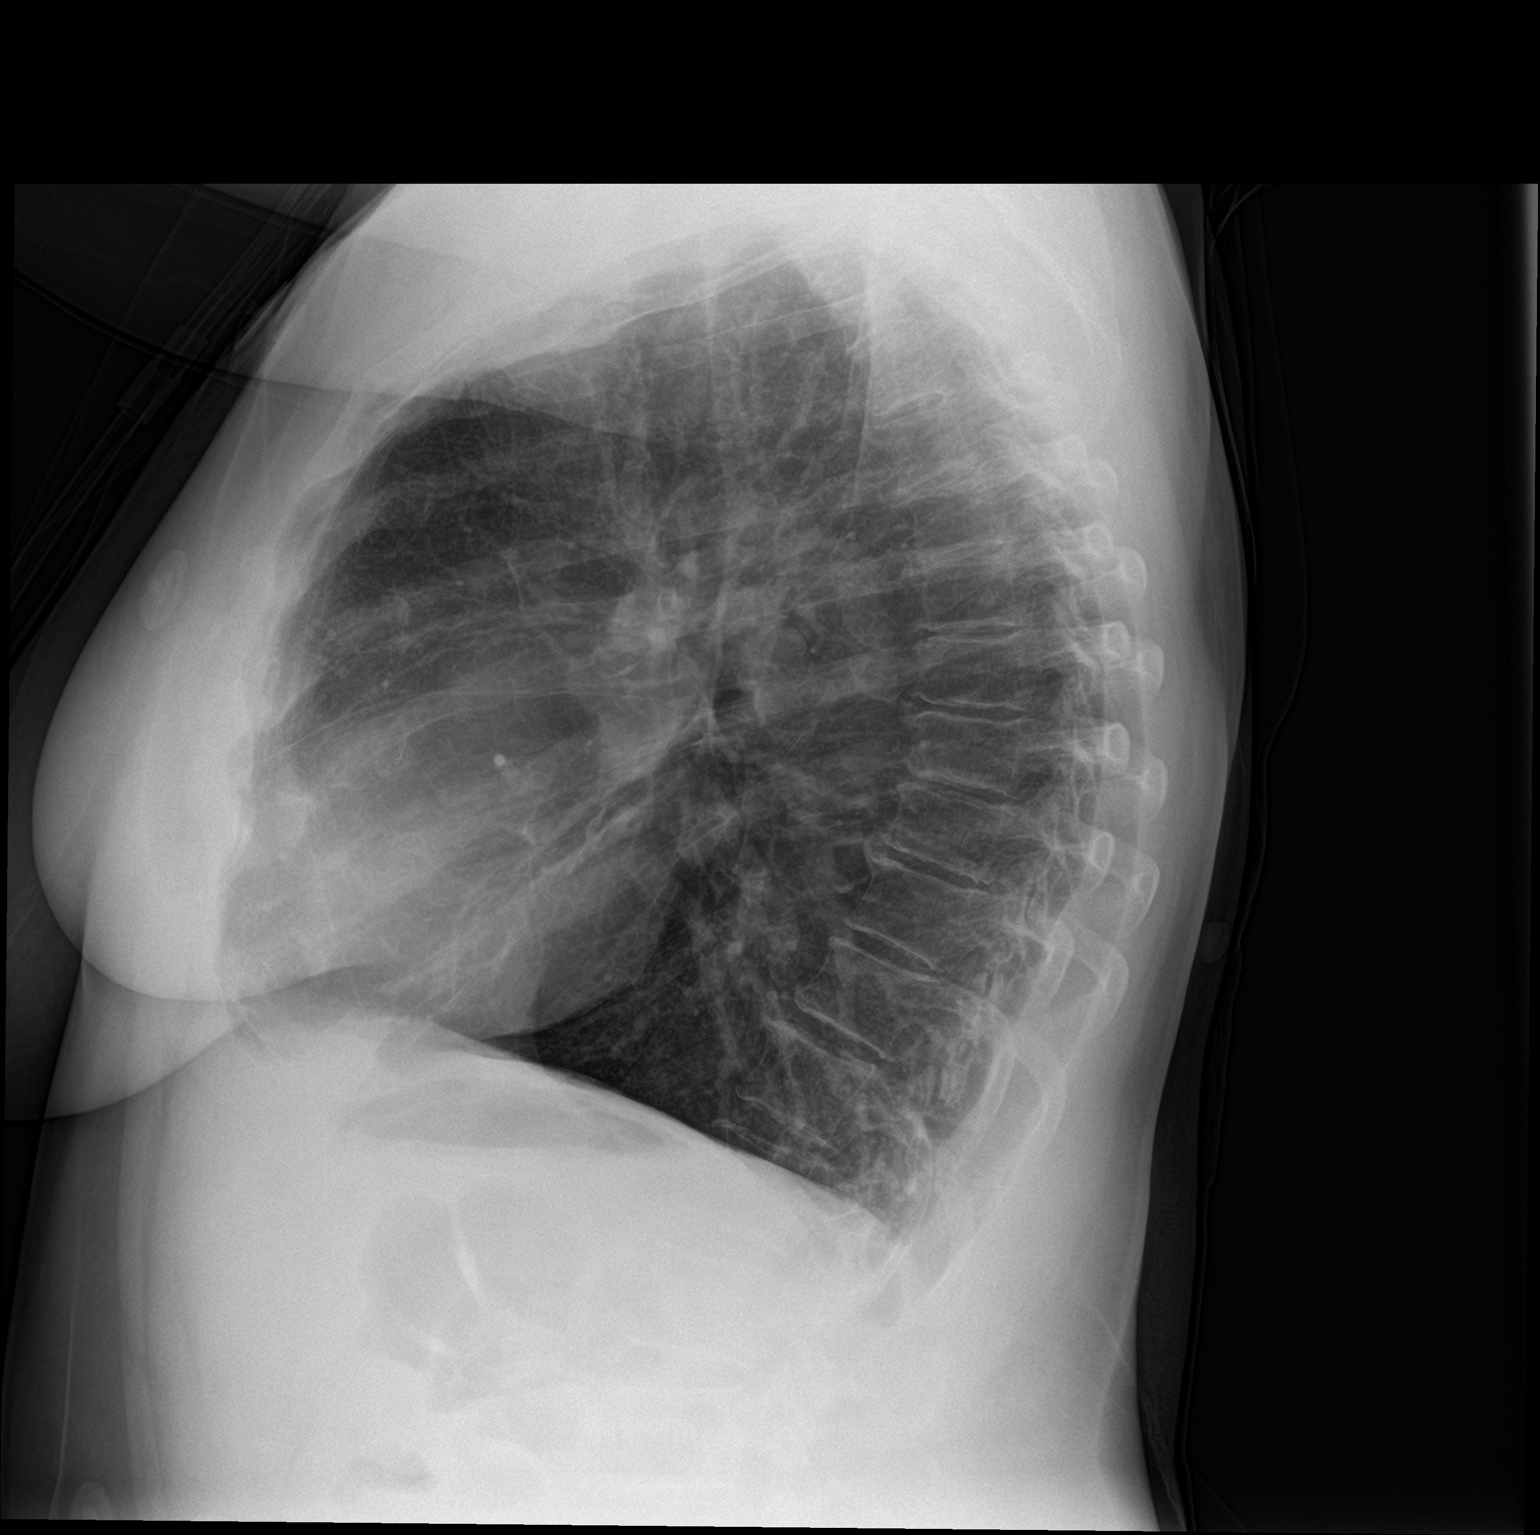

[2 of 2 positions shown; findings below may reference images not displayed]

FINDINGS: Chronic areas of scarring throughout the lungs. Heart is normal
size. No confluent opacities or effusions. Hyperinflation/COPD. No
acute bony abnormality.
IMPRESSION: COPD/chronic changes.  No active disease.

## 2020-03-20 MED ORDER — IPRATROPIUM-ALBUTEROL 0.5-2.5 (3) MG/3ML IN SOLN
3.0000 mL | Freq: Once | RESPIRATORY_TRACT | Status: AC
  Administered 2020-03-20: 3 mL via RESPIRATORY_TRACT

## 2020-03-20 MED ORDER — IPRATROPIUM-ALBUTEROL 0.5-2.5 (3) MG/3ML IN SOLN
3.0000 mL | Freq: Once | RESPIRATORY_TRACT | Status: AC
  Administered 2020-03-20: 3 mL via RESPIRATORY_TRACT
  Filled 2020-03-20: qty 3

## 2020-03-20 MED ORDER — ENOXAPARIN SODIUM 40 MG/0.4ML ~~LOC~~ SOLN
40.0000 mg | SUBCUTANEOUS | Status: DC
  Administered 2020-03-20: 22:00:00 40 mg via SUBCUTANEOUS
  Filled 2020-03-20: qty 0.4

## 2020-03-20 MED ORDER — PREGABALIN 75 MG PO CAPS
75.0000 mg | ORAL_CAPSULE | Freq: Two times a day (BID) | ORAL | Status: DC
  Administered 2020-03-20 – 2020-03-21 (×2): 75 mg via ORAL
  Filled 2020-03-20 (×2): qty 1

## 2020-03-20 MED ORDER — KETOROLAC TROMETHAMINE 30 MG/ML IJ SOLN
15.0000 mg | Freq: Once | INTRAMUSCULAR | Status: AC
  Administered 2020-03-20: 15 mg via INTRAVENOUS
  Filled 2020-03-20: qty 1

## 2020-03-20 MED ORDER — LORAZEPAM 2 MG/ML IJ SOLN
1.0000 mg | Freq: Once | INTRAMUSCULAR | Status: AC
  Administered 2020-03-20: 1 mg via INTRAVENOUS

## 2020-03-20 MED ORDER — METHYLPREDNISOLONE SODIUM SUCC 125 MG IJ SOLR
60.0000 mg | Freq: Two times a day (BID) | INTRAMUSCULAR | Status: DC
  Administered 2020-03-21: 60 mg via INTRAVENOUS
  Filled 2020-03-20: qty 2

## 2020-03-20 MED ORDER — LORATADINE 10 MG PO TABS
10.0000 mg | ORAL_TABLET | Freq: Every day | ORAL | Status: DC
  Administered 2020-03-21: 10 mg via ORAL
  Filled 2020-03-20: qty 1

## 2020-03-20 MED ORDER — ALBUTEROL SULFATE (2.5 MG/3ML) 0.083% IN NEBU: 2.5000 mg | INHALATION_SOLUTION | RESPIRATORY_TRACT | Status: DC | PRN

## 2020-03-20 MED ORDER — CLONAZEPAM 0.5 MG PO TBDP: 0.5000 mg | ORAL_TABLET | Freq: Every evening | ORAL | Status: DC

## 2020-03-20 MED ORDER — LORAZEPAM 0.5 MG PO TABS
0.5000 mg | ORAL_TABLET | Freq: Four times a day (QID) | ORAL | Status: DC | PRN
  Administered 2020-03-21: 0.5 mg via ORAL
  Filled 2020-03-20: qty 1

## 2020-03-20 MED ORDER — LEVOFLOXACIN IN D5W 500 MG/100ML IV SOLN
500.0000 mg | Freq: Once | INTRAVENOUS | Status: AC
  Administered 2020-03-20: 18:00:00 500 mg via INTRAVENOUS
  Filled 2020-03-20: qty 100

## 2020-03-20 MED ORDER — IPRATROPIUM-ALBUTEROL 0.5-2.5 (3) MG/3ML IN SOLN
3.0000 mL | Freq: Four times a day (QID) | RESPIRATORY_TRACT | Status: DC
  Administered 2020-03-20 – 2020-03-21 (×3): 3 mL via RESPIRATORY_TRACT
  Filled 2020-03-20 (×4): qty 3

## 2020-03-20 MED ORDER — LORAZEPAM 2 MG/ML IJ SOLN
INTRAMUSCULAR | Status: AC
  Filled 2020-03-20: qty 1

## 2020-03-20 MED ORDER — SODIUM CHLORIDE 0.9% FLUSH: 3.0000 mL | Freq: Once | INTRAVENOUS | Status: DC

## 2020-03-20 MED ORDER — DOXYCYCLINE HYCLATE 100 MG PO TABS
100.0000 mg | ORAL_TABLET | Freq: Two times a day (BID) | ORAL | Status: DC
  Administered 2020-03-20 – 2020-03-21 (×2): 100 mg via ORAL
  Filled 2020-03-20 (×2): qty 1

## 2020-03-20 MED ORDER — METHYLPREDNISOLONE SODIUM SUCC 125 MG IJ SOLR
125.0000 mg | Freq: Once | INTRAMUSCULAR | Status: AC
  Administered 2020-03-20: 15:00:00 125 mg via INTRAVENOUS
  Filled 2020-03-20: qty 2

## 2020-03-20 MED ORDER — MOMETASONE FURO-FORMOTEROL FUM 200-5 MCG/ACT IN AERO
1.0000 | INHALATION_SPRAY | Freq: Two times a day (BID) | RESPIRATORY_TRACT | Status: DC
  Administered 2020-03-21: 09:00:00 1 via RESPIRATORY_TRACT
  Filled 2020-03-20: qty 8.8

## 2020-03-20 MED ORDER — ACETAMINOPHEN 500 MG PO TABS
1000.0000 mg | ORAL_TABLET | Freq: Once | ORAL | Status: AC
  Administered 2020-03-20: 1000 mg via ORAL
  Filled 2020-03-20: qty 2

## 2020-03-20 NOTE — H&P (Signed)
Colleen [name] is an 59 y.o. female.   Chief Complaint: Increasing shortness of breath and chest tightness. HPI: The patient is a 59 yr old woman who has a past medical history significant for COPD, anxiety, left breast cancer, and asthmaseasonal allergies, and restless leg syndrome. The patient states that she has had cough and increasing shortness of breath for the past week. She was diagnosed with a sinus infection last Wednesday. Her symptoms have just become worse since then. She states that she uses 2L O2 for sleep, but doesn't usually use it during the daytime even with ambulation.   In the ED today her vital signs were largely unremarkable, except for an episode of hypotension at 1600. She was saturating in the low 90's on room air. She was then ambulated on room air. Her oxygen saturations dropped to 88%. She also has noted 2-3 word dyspnea.   Labwork demonstrated a Na of 131, negative COVID, CXR without acute pathology, and EKG with NSR.  The patient denies fevers, chills, productive sputum, nausea, vomiting, constipation, or diarrhea. No lesions, sores, or rashes.   Past Medical History:  Diagnosis Date  . Anxiety   . Asthma   . Breast cancer ([name]) 2004   left breast  . Chronic back pain   . COPD (chronic obstructive pulmonary disease) ([place])   . Depression   . Personal history of radiation therapy     Past Surgical History:  Procedure Laterality Date  . BREAST BIOPSY Left 2004   positive  . BREAST BIOPSY Right    neg  . BREAST LUMPECTOMY Left 2004   positive  . MASTECTOMY Left   . MASTECTOMY    . NEPHRECTOMY Left   . TUBAL LIGATION      Family History  Problem Relation Age of Onset  . Breast cancer Cousin    Social History:  reports that she has been smoking cigarettes. She has been smoking about 0.50 packs per day. She has never used smokeless tobacco. She reports previous alcohol use. She reports that she does not use drugs. (Not in a hospital admission)    Allergies:  Allergies  Allergen Reactions  . Morphine And Related Anaphylaxis  . Penicillins Anaphylaxis    Has patient had a PCN reaction causing immediate rash, facial/tongue/throat swelling, SOB or lightheadedness with hypotension: Yes Has patient had a PCN reaction causing severe rash involving mucus membranes or skin necrosis: No Has patient had a PCN reaction that required hospitalization: Unknown Has patient had a PCN reaction occurring within the last 10 years: No If all of the above answers are "NO", then may proceed with Cephalosporin use.   . Tamoxifen Anaphylaxis  . Clonazepam   . Hydroxyzine Itching  . Sulfa Antibiotics Itching    Pertinent items noted in HPI and remainder of comprehensive ROS otherwise negative. As I enter the room the patient tells me that she is having a panic attack. General appearance: alert, moderate distress and very anxious. Head: Normocephalic, without obvious abnormality, atraumatic Eyes: conjunctivae/corneas clear. PERRL, EOM's intact. Fundi benign. Throat: lips, mucosa, and tongue normal; teeth and gums normal Neck: no adenopathy, no carotid bruit, no JVD, supple, symmetrical, trachea midline and thyroid not enlarged, symmetric, no tenderness/mass/nodules Resp: diminished breath sounds throughout and wheezes throughout Chest wall: no tenderness Cardio: regular rate and rhythm, S1, S2 normal, no murmur, click, rub or gallop GI: soft, non-tender; bowel sounds normal; no masses,  no organomegaly Extremities: extremities normal, atraumatic, no cyanosis or edema Pulses: 2+ and  symmetric Skin: Skin color, texture, turgor normal. No rashes or lesions Lymph nodes: Cervical, supraclavicular, and axillary nodes normal. Neurologic: The patient was very anxious. CN II - XI grossly intact. Patient is moving all extremities. She is oriented x3. I  Results for orders placed or performed during the hospital encounter of 03/20/20 (from the past 48  hour(s))  Basic metabolic panel     Status: Abnormal   Collection Time: 03/20/20  2:13 PM  Result Value Ref Range   Sodium 131 (L) 135 - 145 mmol/L   Potassium 3.9 3.5 - 5.1 mmol/L   Chloride 94 (L) 98 - 111 mmol/L   CO2 28 22 - 32 mmol/L   Glucose, Bld 139 (H) 70 - 99 mg/dL    Comment: Glucose reference range applies only to samples taken after fasting for at least 8 hours.   BUN 6 6 - 20 mg/dL   Creatinine, Ser 0.68 0.44 - 1.00 mg/dL   Calcium 8.5 (L) 8.9 - 10.3 mg/dL   GFR calc non Af Amer >60 >60 mL/min   GFR calc Af Amer >60 >60 mL/min   Anion gap 9 5 - 15    Comment: Performed at [facility], [address] [place], [place] [address]  CBC     Status: None   Collection Time: 03/20/20  2:13 PM  Result Value Ref Range   WBC 4.8 4.0 - 10.5 K/uL   RBC 4.71 3.87 - 5.11 MIL/uL   Hemoglobin 14.2 12.0 - 15.0 g/dL   HCT 42.1 36.0 - 46.0 %   MCV 89.4 80.0 - 100.0 fL   MCH 30.1 26.0 - 34.0 pg   MCHC 33.7 30.0 - 36.0 g/dL   RDW 13.2 11.5 - 15.5 %   Platelets 239 150 - 400 K/uL   nRBC 0.0 0.0 - 0.2 %    Comment: Performed at [facility], [place], [place] [address]  Troponin I (High Sensitivity)     Status: None   Collection Time: 03/20/20  2:13 PM  Result Value Ref Range   Troponin I (High Sensitivity) 3 <18 ng/L    Comment: (NOTE) Elevated high sensitivity troponin I (hsTnI) values and significant  changes across serial measurements may suggest ACS but many other  chronic and acute conditions are known to elevate hsTnI results.  Refer to the "Links" section for chest pain algorithms and additional  guidance. Performed at [facility], [address] [place], [place] [address]   Brain natriuretic peptide     Status: None   Collection Time: 03/20/20  2:13 PM  Result Value Ref Range   B Natriuretic Peptide 34.0 0.0 - 100.0 pg/mL    Comment: Performed at [facility], [address] [place], [place] [address]  SARS  Coronavirus 2 by RT PCR (hospital order, performed in [facility] hospital lab) Nasopharyngeal Nasopharyngeal Swab     Status: None   Collection Time: 03/20/20  3:30 PM   Specimen: Nasopharyngeal Swab  Result Value Ref Range   SARS Coronavirus 2 NEGATIVE NEGATIVE    Comment: (NOTE) SARS-CoV-2 target nucleic acids are NOT DETECTED. The SARS-CoV-2 RNA is generally detectable in upper and lower respiratory specimens during the acute phase of infection. The lowest concentration of SARS-CoV-2 viral copies this assay can detect is 250 copies / mL. A negative result does not preclude SARS-CoV-2 infection and should not be used as the sole basis for treatment or other patient management decisions.  A negative  result may occur with improper specimen collection / handling, submission of specimen other than nasopharyngeal swab, presence of viral mutation(s) within the areas targeted by this assay, and inadequate number of viral copies (<250 copies / mL). A negative result must be combined with clinical observations, patient history, and epidemiological information. Fact Sheet for Patients:   StrictlyIdeas.no Fact Sheet for Healthcare Providers: BankingDealers.co.za This test is not yet approved or cleared  by the [place] FDA and has been authorized for detection and/or diagnosis of SARS-CoV-2 by FDA under an Emergency Use Authorization (EUA).  This EUA will remain in effect (meaning this test can be used) for the duration of the COVID-19 declaration under Section 564(b)(1) of the Act, 21 U.S.C. section 360bbb-3(b)(1), unless the authorization is terminated or revoked sooner. Performed at [facility], [place], [place] [address]   Troponin I (High Sensitivity)     Status: None   Collection Time: 03/20/20  4:20 PM  Result Value Ref Range   Troponin I (High Sensitivity) 3 <18 ng/L    Comment: (NOTE) Elevated high  sensitivity troponin I (hsTnI) values and significant  changes across serial measurements may suggest ACS but many other  chronic and acute conditions are known to elevate hsTnI results.  Refer to the "Links" section for chest pain algorithms and additional  guidance. Performed at [facility], [address] [place], [place] [address]    @[email or web address] @  Blood pressure 91/80, pulse 93, temperature 98.1 F (36.7 C), temperature source Oral, resp. rate 18, height 5' (1.524 m), weight 61.2 kg, SpO2 97 %.    Assessment/Plan Problem  Copd Exacerbation (Hcc)  Asthma, Chronic, Unspecified Asthma Severity, With Acute Exacerbation  Panic Attack  Acute On Chronic Respiratory Failure With Hypoxia (Hcc)  Gad (Generalized Anxiety Disorder)   Acute on chronic respiratory failure: The patient uses 2L O2 at home for sleep. Here she is requiring 2L to maintain saturations in the mid nineties. She has 2-3 word dyspnea with conversation with oxygen on.   Acute exacerbation of COPD/asthma: The patient will receive IV steroids, nebulizer treatments, and doxycycline  Sinusitis: Doxycycline continued.   Panic attack/Generalized anxiety disorder: Exacerbated by dyspnea, beta agonists, and steroids. She is given ativan 1 mg IV once in the ED. She will have 0.5 mg ativan tablets available on an as needed basis.  Restless leg syndrome: Continued lyrica as at home.  I have seen and examined this patient myself. I have spent 72 minutes in her evaluation and admission  DVT Prophylaxis: Lovenox CODE STATUS: Full Code Family communication: None available Disposition: Patient is from home. I anticipate discharge to home. Barriers to discharge include hypoxemia, and COPD exacerbation. The patient's illness is complicated by anxiety.  Maimuna Leaman 03/20/2020, 6:06 PM

## 2020-03-20 NOTE — ED Triage Notes (Signed)
Pt arrives POV from home for c/o worsening cough resulting in [facility] and chest pain. States she has been on an abx for sinus infection since Wednesday but does not seem to be improving. Pt 88% on RA in triage, placed on 2L [place]. Pt gets winded speaking in long sentences, no coughing noted in triage.

## 2020-03-20 NOTE — ED Notes (Signed)
Pt ambulated without oxygen sat's 88.  Dr Jari Pigg aware.  Pt placed back on 2 liters oxygen [place].

## 2020-03-20 NOTE — ED Notes (Signed)
meds given for anxiety.  Pt waiting on  Admission  Pt alert.  nsr on monitor.

## 2020-03-20 NOTE — ED Notes (Signed)
Report called to kim rn floor nuse

## 2020-03-20 NOTE — ED Provider Notes (Signed)
[facility] Emergency Department Provider Note  ____________________________________________   First MD Initiated Contact with Patient 03/20/20 1426     (approximate)  I have reviewed the triage vital signs and the nursing notes.   HISTORY  Chief Complaint Shortness of Breath    HPI Colleen [name] is a 59 y.o. female with COPD, left breast cancer, asthma who comes in for shortness of breath.  Patient is had some shortness of breath, chest pain and worsening cough.  States that she has been on antibiotics for sinus infection since Wednesday but not improving.  Patient initially 88% in triage placed on 2 L.  Patient states that she uses 2-1/2 L of oxygen occasionally at nighttime but never needs oxygen during the day.  States that the antibiotic was clarithromycin and that she did not really have much improvement in her symptoms but then she started having some shortness of breath that was moderate, constant, nothing made it better, nothing made it worse.  She has not been on any steroids recently.  Has not been Covid vaccinated but states that she has not been out.  Denies any chest pain, leg swelling.          Past Medical History:  Diagnosis Date  . Anxiety   . Asthma   . Breast cancer ([place]) 2004   left breast  . Chronic back pain   . COPD (chronic obstructive pulmonary disease) ([place])   . Depression   . Personal history of radiation therapy     Patient Active Problem List   Diagnosis Date Noted  . Acute respiratory failure ([place])   . Sepsis ([place]) 10/11/2018  . CAP (community acquired pneumonia) 10/11/2018  . COPD (chronic obstructive pulmonary disease) ([place]) 10/11/2018  . GAD (generalized anxiety disorder) 10/11/2018    Past Surgical History:  Procedure Laterality Date  . BREAST BIOPSY Left 2004   positive  . BREAST BIOPSY Right    neg  . BREAST LUMPECTOMY Left 2004   positive  . MASTECTOMY Left   . MASTECTOMY    . NEPHRECTOMY Left     . TUBAL LIGATION      Prior to Admission medications   Medication Sig Start Date End Date Taking? Authorizing Provider  BREO ELLIPTA 100-25 MCG/INH AEPB Inhale 1 puff into the lungs daily as needed. 07/21/18   [provider]  cetirizine-pseudoephedrine (ZYRTEC-D) 5-120 MG tablet Take 1 tablet by mouth 2 (two) times daily as needed for allergies.    [provider]  clonazePAM (KLONOPIN) 0.5 MG disintegrating tablet Take 0.5 mg by mouth every evening. 07/21/18   [provider]  diphenhydrAMINE (BENADRYL ALLERGY) 25 MG tablet Take 1 tablet (25 mg total) by mouth every 6 (six) hours as needed. 07/22/19   Sable Feil, PA-C  hydrocortisone valerate ointment (WESTCORT) 0.2 % Apply to affected area daily 07/22/19 07/21/20  Sable Feil, PA-C  loratadine (CLARITIN) 10 MG tablet Take 10 mg by mouth daily. 07/06/18   [provider]  methylPREDNISolone (MEDROL DOSEPAK) 4 MG TBPK tablet Take Tapered dose as directed 07/22/19   Sable Feil, PA-C  predniSONE (DELTASONE) 10 MG tablet Take 1 tablet (10 mg total) by mouth daily. Day 1-2: Take 50 mg  Day 3-4: Take 40 mg  Day 5-6: Take 30mg   Day7-8:  Take 20 mg Day9:  Take 10 11/18/19   Merlyn Lot, MD  pregabalin (LYRICA) 75 MG capsule Take 75 mg by mouth 2 (two) times daily.  [provider]  VENTOLIN HFA 108 (90 Base) MCG/ACT inhaler Inhale 1-2 puffs into the lungs every 4 (four) hours as needed for cough or wheezing. 10/02/18   [provider]    Allergies Morphine and related, Penicillins, Tamoxifen, Clonazepam, Hydroxyzine, and Sulfa antibiotics  Family History  Problem Relation Age of Onset  . Breast cancer Cousin     Social History Social History   Tobacco Use  . Smoking status: Current Every Day Smoker    Packs/day: 0.50    Types: Cigarettes  . Smokeless tobacco: Never Used  Substance Use Topics  . Alcohol use: Not Currently  . Drug use: Never      Review of  Systems Constitutional: No fever/chills Eyes: No visual changes. ENT: No sore throat.  Positive congestion Cardiovascular: No chest pain Respiratory: Positive for SOB, congestion, cough Gastrointestinal: No abdominal pain.  No nausea, no vomiting.  No diarrhea.  No constipation. Genitourinary: Negative for dysuria. Musculoskeletal: Negative for back pain. Skin: Negative for rash. Neurological: Negative for headaches, focal weakness or numbness. All other ROS negative ____________________________________________   PHYSICAL EXAM:  VITAL SIGNS: ED Triage Vitals  Enc Vitals Group     BP --      Pulse Rate 03/20/20 1400 96     Resp 03/20/20 1400 (!) 22     Temp 03/20/20 1400 98.1 F (36.7 C)     Temp Source 03/20/20 1400 Oral     SpO2 03/20/20 1400 96 %     Weight 03/20/20 1401 135 lb (61.2 kg)     Height 03/20/20 1401 5' (1.524 m)     Head Circumference --      Peak Flow --      Pain Score 03/20/20 1401 10     Pain Loc --      Pain Edu? --      Excl. in [place]? --     Constitutional: Alert and oriented. Well appearing and in no acute distress. Eyes: Conjunctivae are normal. EOMI. Head: Atraumatic. Nose: No congestion/rhinnorhea.  Positive congestion Mouth/Throat: Mucous membranes are moist.   Neck: No stridor. Trachea Midline. FROM Cardiovascular: Normal rate, regular rhythm. Grossly normal heart sounds.  Good peripheral circulation. Respiratory: Increased work of breathing with speaking with some mild expiratory wheezing on 2 L Gastrointestinal: Soft and nontender. No distention. No abdominal bruits.  Musculoskeletal: No lower extremity tenderness nor edema.  No joint effusions. Neurologic:  Normal speech and language. No gross focal neurologic deficits are appreciated.  Skin:  Skin is warm, dry and intact. No rash noted. Psychiatric: Mood and affect are normal. Speech and behavior are normal. GU: Deferred   ____________________________________________   LABS (all labs  ordered are listed, but only abnormal results are displayed)  Labs Reviewed  BASIC METABOLIC PANEL - Abnormal; Notable for the following components:      Result Value   Sodium 131 (*)    Chloride 94 (*)    Glucose, Bld 139 (*)    Calcium 8.5 (*)    All other components within normal limits  SARS CORONAVIRUS 2 BY RT PCR (HOSPITAL ORDER, [facility])  RESPIRATORY PANEL BY PCR  CBC  BRAIN NATRIURETIC PEPTIDE  PROCALCITONIN  PROCALCITONIN  HIV ANTIBODY (ROUTINE TESTING W REFLEX)  CBC  BASIC METABOLIC PANEL  CBC  CBC WITH DIFFERENTIAL/PLATELET  TROPONIN I (HIGH SENSITIVITY)  TROPONIN I (HIGH SENSITIVITY)   ____________________________________________   ED ECG REPORT I, Vanessa Fridley, the attending physician, personally viewed  and interpreted this ECG.  EKG is normal sinus rhythm 95, no ST elevation, no T wave inversions, normal intervals ____________________________________________  RADIOLOGY Robert Bellow, personally viewed and evaluated these images (plain radiographs) as part of my medical decision making, as well as reviewing the written report by the radiologist.  ED MD interpretation: No pneumonia noted  Official radiology report(s): DG Chest 2 View  Result Date: 03/20/2020 CLINICAL DATA:  Shortness of breath EXAM: CHEST - 2 VIEW COMPARISON:  11/18/2019 FINDINGS: Chronic areas of scarring throughout the lungs. Heart is normal size. No confluent opacities or effusions. Hyperinflation/COPD. No acute bony abnormality. IMPRESSION: COPD/chronic changes.  No active disease. Electronically Signed   By: Rolm Baptise M.D.   On: 03/20/2020 14:56    ____________________________________________   PROCEDURES  Procedure(s) performed (including Critical Care):  Procedures   ____________________________________________   INITIAL IMPRESSION / ASSESSMENT AND PLAN / ED COURSE   Colleen [name] was evaluated in Emergency Department on 03/20/2020 for  the symptoms described in the history of present illness. She was evaluated in the context of the global COVID-19 pandemic, which necessitated consideration that the patient might be at risk for infection with the SARS-CoV-2 virus that causes COVID-19. Institutional protocols and algorithms that pertain to the evaluation of patients at risk for COVID-19 are in a state of rapid change based on information released by regulatory bodies including the CDC and federal and state organizations. These policies and algorithms were followed during the patient's care in the ED.    Pt presents with SOB. Differential includes: Possibly secondary to her COPD given some expiratory wheezing. PNA-will get xray to evaluation Anemia-CBC to evaluate ACS- will get trops Arrhythmia-Will get EKG and keep on monitor.  COVID- will get testing per algorithm. PE-lower suspicion given no risk factors and other cause more likely  BNP and cardiac markers are negative therefore likely related to her heart  Chest x-ray no evidence of pneumonia  Patient taken off the oxygen very ambulated and she does have increased work of breathing with sats around 88%.  Some of her wheezing seems to be more upper airway in nature from her sinuses and her repeat lung exam does not have as much wheezing but given patient does look short of breath with talking and her shortness of breath with use desats down to 88% with ambulation assessed with family about admission and patient is willing to come in the hospital for some continued nebs, careful observation, will also put on some levofloxacin for possible sinusitis/COPD exacerbation due to the congestion.   ____________________________________________   FINAL CLINICAL IMPRESSION(S) / ED DIAGNOSES   Final diagnoses:  COPD exacerbation ([place])     MEDICATIONS GIVEN DURING THIS VISIT:  Medications  sodium chloride flush (NS) 0.9 % injection 3 mL (has no administration in time range)   levofloxacin (LEVAQUIN) IVPB 500 mg (500 mg Intravenous New Bag/Given 03/20/20 1812)  enoxaparin (LOVENOX) injection 40 mg (has no administration in time range)  doxycycline (VIBRA-TABS) tablet 100 mg (has no administration in time range)  mometasone-formoterol (DULERA) 200-5 MCG/ACT inhaler 1 puff (has no administration in time range)  ipratropium-albuterol (DUONEB) 0.5-2.5 (3) MG/3ML nebulizer solution 3 mL (has no administration in time range)  albuterol (PROVENTIL) (2.5 MG/3ML) 0.083% nebulizer solution 2.5 mg (has no administration in time range)  pregabalin (LYRICA) capsule 75 mg (has no administration in time range)  loratadine (CLARITIN) tablet 10 mg (has no administration in time range)  methylPREDNISolone sodium succinate (SOLU-MEDROL) 125 mg/2 mL  injection 60 mg (has no administration in time range)  LORazepam (ATIVAN) tablet 0.5 mg (has no administration in time range)  ipratropium-albuterol (DUONEB) 0.5-2.5 (3) MG/3ML nebulizer solution 3 mL (3 mLs Nebulization Given 03/20/20 1526)  ipratropium-albuterol (DUONEB) 0.5-2.5 (3) MG/3ML nebulizer solution 3 mL (3 mLs Nebulization Given 03/20/20 1526)  ipratropium-albuterol (DUONEB) 0.5-2.5 (3) MG/3ML nebulizer solution 3 mL (3 mLs Nebulization Given 03/20/20 1526)  methylPREDNISolone sodium succinate (SOLU-MEDROL) 125 mg/2 mL injection 125 mg (125 mg Intravenous Given 03/20/20 1526)  acetaminophen (TYLENOL) tablet 1,000 mg (1,000 mg Oral Given 03/20/20 1811)  ketorolac (TORADOL) 30 MG/ML injection 15 mg (15 mg Intravenous Given 03/20/20 1812)  LORazepam (ATIVAN) injection 1 mg (1 mg Intravenous Given 03/20/20 1812)     ED Discharge Orders    None       Note:  This document was prepared using Dragon voice recognition software and may include unintentional dictation errors.   Vanessa [place], MD 03/20/20 [phone]

## 2020-03-21 DIAGNOSIS — J441 Chronic obstructive pulmonary disease with (acute) exacerbation: Secondary | ICD-10-CM | POA: Diagnosis not present

## 2020-03-21 DIAGNOSIS — J9621 Acute and chronic respiratory failure with hypoxia: Secondary | ICD-10-CM | POA: Diagnosis not present

## 2020-03-21 LAB — BASIC METABOLIC PANEL
Anion gap: 9 (ref 5–15)
BUN: 13 mg/dL (ref 6–20)
CO2: 25 mmol/L (ref 22–32)
Calcium: 8.9 mg/dL (ref 8.9–10.3)
Chloride: 98 mmol/L (ref 98–111)
Creatinine, Ser: 0.53 mg/dL (ref 0.44–1.00)
GFR calc Af Amer: >60 mL/min (ref >60)
GFR calc non Af Amer: >60 mL/min (ref >60)
Glucose, Bld: 147 mg/dL — ABNORMAL HIGH (ref 70–99)
Potassium: 4.3 mmol/L (ref 3.5–5.1)
Sodium: 132 mmol/L — ABNORMAL LOW (ref 135–145)

## 2020-03-21 LAB — CBC WITH DIFFERENTIAL/PLATELET
Abs Immature Granulocytes: 0.02 10*3/uL (ref 0.00–0.07)
Basophils Absolute: 0 10*3/uL (ref 0.0–0.1)
Basophils Relative: 0 %
Eosinophils Absolute: 0 10*3/uL (ref 0.0–0.5)
Eosinophils Relative: 0 %
HCT: 41.2 % (ref 36.0–46.0)
Hemoglobin: 13.4 g/dL (ref 12.0–15.0)
Immature Granulocytes: 0 %
Lymphocytes Relative: 18 %
Lymphs Abs: 0.9 10*3/uL (ref 0.7–4.0)
MCH: 29.8 pg (ref 26.0–34.0)
MCHC: 32.5 g/dL (ref 30.0–36.0)
MCV: 91.6 fL (ref 80.0–100.0)
Monocytes Absolute: 0.1 10*3/uL (ref 0.1–1.0)
Monocytes Relative: 1 %
Neutro Abs: 3.8 10*3/uL (ref 1.7–7.7)
Neutrophils Relative %: 81 %
Platelets: 225 10*3/uL (ref 150–400)
RBC: 4.5 MIL/uL (ref 3.87–5.11)
RDW: 12.8 % (ref 11.5–15.5)
Smear Review: NORMAL
WBC: 4.7 10*3/uL (ref 4.0–10.5)
nRBC: 0 % (ref 0.0–0.2)

## 2020-03-21 LAB — HIV ANTIBODY (ROUTINE TESTING W REFLEX): HIV Screen 4th Generation wRfx: NONREACTIVE

## 2020-03-21 LAB — PROCALCITONIN: Procalcitonin: <0.1 ng/mL

## 2020-03-21 MED ORDER — METHYLPREDNISOLONE 4 MG PO TBPK
ORAL_TABLET | ORAL | 0 refills | Status: DC
Start: 2020-03-21 — End: 2020-06-04

## 2020-03-21 MED ORDER — DOXYCYCLINE HYCLATE 100 MG PO TABS: 100.0000 mg | ORAL_TABLET | Freq: Two times a day (BID) | ORAL | 0 refills | Status: AC

## 2020-03-21 NOTE — Progress Notes (Signed)
Nutrition Brief Note  RD received consult for assessment of nutrition requirements/status per COPD protocol  Wt Readings from Last 15 Encounters:  03/20/20 63 kg  11/18/19 65.8 kg  07/22/19 61.2 kg  10/12/18 61 kg  06/09/18 12 kg   59 year old female with PMHx of asthma, COPD, left breast cancer s/p lumpectomy in 2004 and XRT, anxiety, depression, chronic back pain admitted acute exacerbation of COPD and asthma.  Met with patient at bedside. She reports she has a good appetite and intake at baseline that is unchanged. At home she typically eats 2 meals per day. For breakfast she has cereal. For dinner she has meat with vegetables and potatoes. She denies any unintentional weight loss and reports she is weight-stable at around 135 lbs. She is currently 63 kg (138.8 lbs). Patient has not yet received a meal since she has been admitted. She has placed breakfast order and is waiting on tray to be delivered at this time. No subcutaneous fat or muscle wasting found on Nutrition-Focused Physical Exam. Patient does not meet criteria for malnutrition at this time.  Current diet order is heart healthy. Labs and medications reviewed.   No nutrition interventions warranted at this time. If nutrition issues arise, please consult RD.   Jacklynn Barnacle, MS, RD, LDN Pager number available on Amion

## 2020-03-21 NOTE — Progress Notes (Signed)
D: Pt alert and oriented x 4. Pt denies experiencing any pain at this time.   A: Pt received discharge and medication education/information. Pt belongings were gathered and taken with pt upon discharge to include cell phone and purse.  R: Pt verbalized understanding of discharge and medication education/information.  Pt escorted to medical mall front lobby by staff via wheelchair where pt is picked up by husband.

## 2020-03-21 NOTE — Plan of Care (Signed)
  Problem: Clinical Measurements: Goal: Will remain free from infection Outcome: Progressing   Problem: Clinical Measurements: Goal: Will remain free from infection Outcome: Progressing   Problem: Activity: Goal: Risk for activity intolerance will decrease Outcome: Progressing   Problem: Nutrition: Goal: Adequate nutrition will be maintained Outcome: Progressing   Problem: Coping: Goal: Level of anxiety will decrease Outcome: Progressing   Problem: Safety: Goal: Ability to remain free from injury will improve Outcome: Progressing   Problem: Skin Integrity: Goal: Risk for impaired skin integrity will decrease Outcome: Progressing

## 2020-03-21 NOTE — Discharge Summary (Signed)
[facility] Discharge Summary   Patient: Colleen [name] [id number]  PCP: Gae Bon, NP  Date of admission: 03/20/2020   Date of discharge:  03/21/2020     Discharge Diagnoses:  Principal diagnosis COPD exacerbation Active Problems:   GAD (generalized anxiety disorder)   Acute on chronic respiratory failure with hypoxia (HCC)   COPD exacerbation (HCC)   Asthma, chronic, unspecified asthma severity, with acute exacerbation   Panic attack   Admitted From: Home Disposition:  Home   Recommendations for Outpatient Follow-up:  1. PCP: In 1 week   Diet recommendation: Regular diet  Activity: The patient is advised to gradually reintroduce usual activities, as tolerated  Discharge Condition: stable  Code Status: Full code   History of present illness: As per the H and P dictated on admission The patient is a 59 yr old woman who has a past medical history significant for COPD, anxiety, left breast cancer, and asthmaseasonal allergies, and restless leg syndrome. The patient states that she has had cough and increasing shortness of breath for the past week. She was diagnosed with a sinus infection last Wednesday. Her symptoms have just become worse since then. She states that she uses 2L O2 for sleep, but doesn't usually use it during the daytime even with ambulation.   In the ED today her vital signs were largely unremarkable, except for an episode of hypotension at 1600. She was saturating in the low 90's on room air. She was then ambulated on room air. Her oxygen saturations dropped to 88%. She also has noted 2-3 word dyspnea.   Labwork demonstrated a Na of 131, negative COVID, CXR without acute pathology, and EKG with NSR.   Hospital Course:  Assessment/Plan Active Problems Copd Exacerbation (Hcc) Asthma, Chronic, Unspecified Asthma Severity, With Acute Exacerbation Panic Attack Acute On Chronic Respiratory Failure With Hypoxia (Hcc) Gad (Generalized Anxiety  Disorder)  # Acute on chronic respiratory failure: The patient uses 2L O2 at home for sleep. Here she is requiring 2L to maintain saturations in the mid nineties. She has 2-3 word dyspnea with conversation with oxygen on.  Patient condition improved and she wanted to be discharged  # Acute exacerbation of COPD/asthma: s/p IV steroids, nebulizer treatments, and doxycycline Patient was given prednisone Dosepak on discharge and recommended to follow with PCP # Sinusitis: Doxycycline continued.  Prescribed for 5 additional days.  Patient has allergies and following specialist as an outpatient, resumed home medications. # Panic attack/Generalized anxiety disorder: Exacerbated by dyspnea, beta agonists, and steroids. She is given ativan 1 mg IV once in the ED. She will have 0.5 mg ativan tablets available on an as needed basis. # Restless leg syndrome: Continued lyrica as at home.  Body mass index is 27.11 kg/m.  Nutrition Interventions:    - Patient was instructed, not to drive, operate heavy machinery, perform activities at heights, swimming or participation in water activities or provide baby sitting services while on Pain, Sleep and Anxiety Medications; until her outpatient Physician has advised to do so again.  - Also recommended to not to take more than prescribed Pain, Sleep and Anxiety Medications.  On the day of the discharge the patient's vitals were stable, and no other acute medical condition were reported by patient. the patient was felt safe to be discharge at Home   Consultants: none Procedures: none  Discharge Exam: General: Appear in no distress, no Rash; Oral Mucosa Clear, moist. Cardiovascular: S1 and S2 Present, no Murmur, Respiratory: normal respiratory effort, Bilateral  Air entry present and no Crackles, no wheezes Abdomen: Bowel Sound present, Soft and no tenderness, no hernia Extremities: no Pedal edema, no calf tenderness Neurology: alert and oriented to time, place,  and person affect appropriate.  Filed Weights   03/20/20 1401 03/20/20 2042  Weight: 61.2 kg 63 kg   Vitals:   03/21/20 0732 03/21/20 0810  BP:  120/85  Pulse:  99  Resp:  17  Temp:  98.5 F (36.9 C)  SpO2: 91% 92%    DISCHARGE MEDICATION: Allergies as of 03/21/2020      Reactions   Morphine And Related Anaphylaxis   Penicillins Anaphylaxis   Has patient had a PCN reaction causing immediate rash, facial/tongue/throat swelling, SOB or lightheadedness with hypotension: Yes Has patient had a PCN reaction causing severe rash involving mucus membranes or skin necrosis: No Has patient had a PCN reaction that required hospitalization: Unknown Has patient had a PCN reaction occurring within the last 10 years: No If all of the above answers are "NO", then may proceed with Cephalosporin use.   Tamoxifen Anaphylaxis   Clonazepam    Codeine Swelling   Hydroxyzine Itching   Sulfa Antibiotics Itching      Medication List    STOP taking these medications   predniSONE 10 MG tablet Commonly known as: DELTASONE     TAKE these medications   aspirin EC 81 MG tablet Take 81 mg by mouth daily.   Breo Ellipta 100-25 MCG/INH Aepb Generic drug: fluticasone furoate-vilanterol Inhale 1 puff into the lungs daily as needed.   cetirizine-pseudoephedrine 5-120 MG tablet Commonly known as: ZYRTEC-D Take 1 tablet by mouth 2 (two) times daily as needed for allergies.   clonazePAM 0.5 MG disintegrating tablet Commonly known as: KLONOPIN Take 0.5 mg by mouth every evening.   diphenhydrAMINE 25 MG tablet Commonly known as: Benadryl Allergy Take 1 tablet (25 mg total) by mouth every 6 (six) hours as needed.   doxycycline 100 MG tablet Commonly known as: VIBRA-TABS Take 1 tablet (100 mg total) by mouth every 12 (twelve) hours for 5 days.   hydrocortisone valerate ointment 0.2 % Commonly known as: Westcort Apply to affected area daily   loratadine 10 MG tablet Commonly known as:  CLARITIN Take 10 mg by mouth daily.   methylPREDNISolone 4 MG Tbpk tablet Commonly known as: MEDROL DOSEPAK Take Tapered dose as directed   pregabalin 75 MG capsule Commonly known as: LYRICA Take 75 mg by mouth 2 (two) times daily.   Ventolin HFA 108 (90 Base) MCG/ACT inhaler Generic drug: albuterol Inhale 1-2 puffs into the lungs every 4 (four) hours as needed for cough or wheezing.      Allergies  Allergen Reactions  . Morphine And Related Anaphylaxis  . Penicillins Anaphylaxis    Has patient had a PCN reaction causing immediate rash, facial/tongue/throat swelling, SOB or lightheadedness with hypotension: Yes Has patient had a PCN reaction causing severe rash involving mucus membranes or skin necrosis: No Has patient had a PCN reaction that required hospitalization: Unknown Has patient had a PCN reaction occurring within the last 10 years: No If all of the above answers are "NO", then may proceed with Cephalosporin use.   . Tamoxifen Anaphylaxis  . Clonazepam   . Codeine Swelling  . Hydroxyzine Itching  . Sulfa Antibiotics Itching   Discharge Instructions    Call MD for:  difficulty breathing, headache or visual disturbances   Complete by: As directed    Call MD for:  extreme fatigue  Complete by: As directed    Call MD for:  hives   Complete by: As directed    Call MD for:  persistant dizziness or light-headedness   Complete by: As directed    Call MD for:  temperature >100.4   Complete by: As directed    Diet - low sodium heart healthy   Complete by: As directed    Increase activity slowly   Complete by: As directed       The results of significant diagnostics from this hospitalization (including imaging, microbiology, ancillary and laboratory) are listed below for reference.    Significant Diagnostic Studies: DG Chest 2 View  Result Date: 03/20/2020 CLINICAL DATA:  Shortness of breath EXAM: CHEST - 2 VIEW COMPARISON:  11/18/2019 FINDINGS: Chronic areas of  scarring throughout the lungs. Heart is normal size. No confluent opacities or effusions. Hyperinflation/COPD. No acute bony abnormality. IMPRESSION: COPD/chronic changes.  No active disease. Electronically Signed   By: Rolm Baptise M.D.   On: 03/20/2020 14:56    Microbiology: Recent Results (from the past 240 hour(s))  SARS Coronavirus 2 by RT PCR (hospital order, performed in [facility] hospital lab) Nasopharyngeal Nasopharyngeal Swab     Status: None   Collection Time: 03/20/20  3:30 PM   Specimen: Nasopharyngeal Swab  Result Value Ref Range Status   SARS Coronavirus 2 NEGATIVE NEGATIVE Final    Comment: (NOTE) SARS-CoV-2 target nucleic acids are NOT DETECTED. The SARS-CoV-2 RNA is generally detectable in upper and lower respiratory specimens during the acute phase of infection. The lowest concentration of SARS-CoV-2 viral copies this assay can detect is 250 copies / mL. A negative result does not preclude SARS-CoV-2 infection and should not be used as the sole basis for treatment or other patient management decisions.  A negative result may occur with improper specimen collection / handling, submission of specimen other than nasopharyngeal swab, presence of viral mutation(s) within the areas targeted by this assay, and inadequate number of viral copies (<250 copies / mL). A negative result must be combined with clinical observations, patient history, and epidemiological information. Fact Sheet for Patients:   StrictlyIdeas.no Fact Sheet for Healthcare Providers: BankingDealers.co.za This test is not yet approved or cleared  by the [place] FDA and has been authorized for detection and/or diagnosis of SARS-CoV-2 by FDA under an Emergency Use Authorization (EUA).  This EUA will remain in effect (meaning this test can be used) for the duration of the COVID-19 declaration under Section 564(b)(1) of the Act, 21 U.S.C. section  360bbb-3(b)(1), unless the authorization is terminated or revoked sooner. Performed at [facility], [address] [place],  [address]      Labs: CBC: Recent Labs  Lab 03/20/20 1413 03/21/20 0425  WBC 4.8 4.7  NEUTROABS  --  3.8  HGB 14.2 13.4  HCT 42.1 41.2  MCV 89.4 91.6  PLT 239 [id number]   Basic Metabolic Panel: Recent Labs  Lab 03/20/20 1413 03/21/20 0425  NA 131* 132*  K 3.9 4.3  CL 94* 98  CO2 28 25  GLUCOSE 139* 147*  BUN 6 13  CREATININE 0.68 0.53  CALCIUM 8.5* 8.9   Liver Function Tests: No results for input(s): AST, ALT, ALKPHOS, BILITOT, PROT, ALBUMIN in the last 168 hours. No results for input(s): LIPASE, AMYLASE in the last 168 hours. No results for input(s): AMMONIA in the last 168 hours. Cardiac Enzymes: No results for input(s): CKTOTAL, CKMB, CKMBINDEX, TROPONINI in the last 168 hours. BNP (last  3 results) Recent Labs    03/20/20 1413  BNP 34.0   CBG: No results for input(s): GLUCAP in the last 168 hours.  Time spent: 35 minutes  Signed:  Val Riles  [facility]  03/21/2020 12:08 PM

## 2020-04-27 DIAGNOSIS — M545 Low back pain, unspecified: Secondary | ICD-10-CM | POA: Insufficient documentation

## 2020-04-30 ENCOUNTER — Ambulatory Visit
Admission: RE | Admit: 2020-04-30 | Discharge: 2020-04-30 | Disposition: A | Discharge status: Home / Self Care | Payer: 59 | Source: Ambulatory Visit | Attending: Family Medicine | Admitting: Family Medicine

## 2020-04-30 DIAGNOSIS — Z1231 Encounter for screening mammogram for malignant neoplasm of breast: Secondary | ICD-10-CM | POA: Insufficient documentation

## 2020-04-30 IMAGING — MG DIGITAL SCREENING BILAT W/ TOMO W/ CAD
8 series · 8 of 24 positions shown · non-contrast
Comparison: Previous exam(s).

CLINICAL DATA: Screening.

EXAM:
DIGITAL SCREENING BILATERAL MAMMOGRAM WITH TOMO AND CAD

[L CC synth-2D]
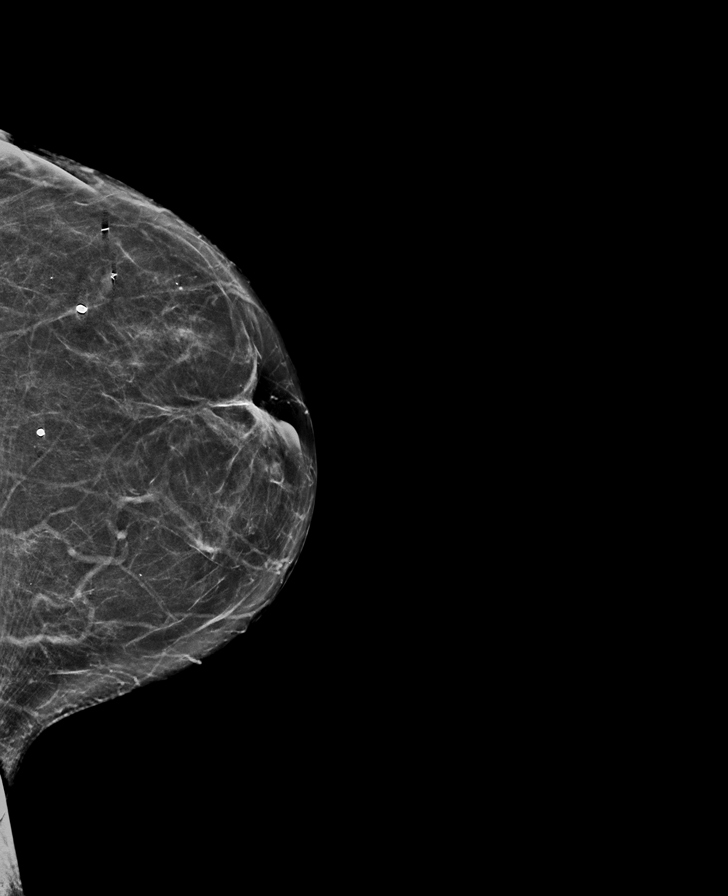

[R MLO synth-2D]
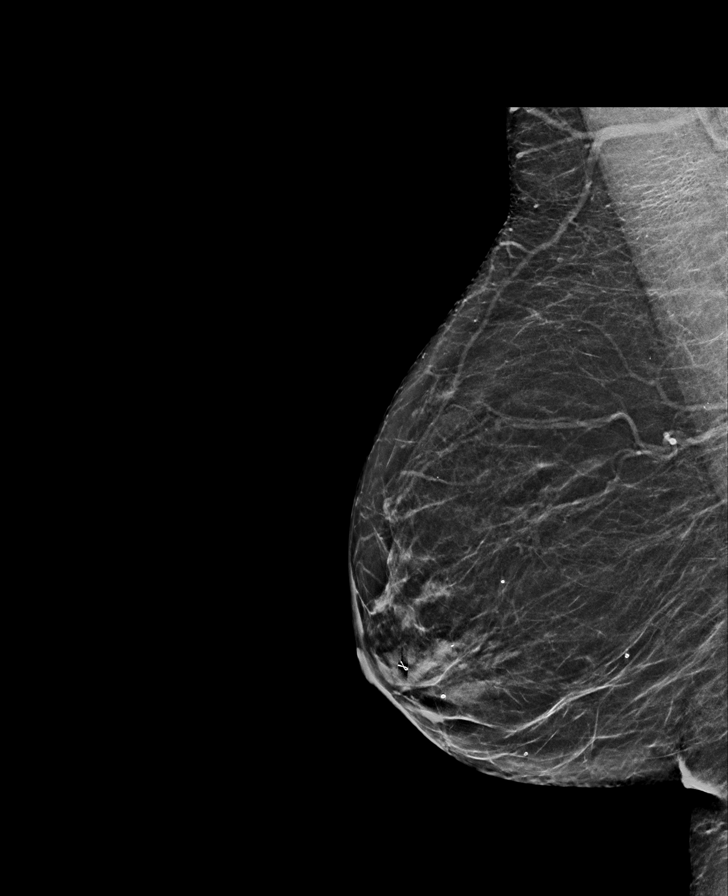

[R CC synth-2D]
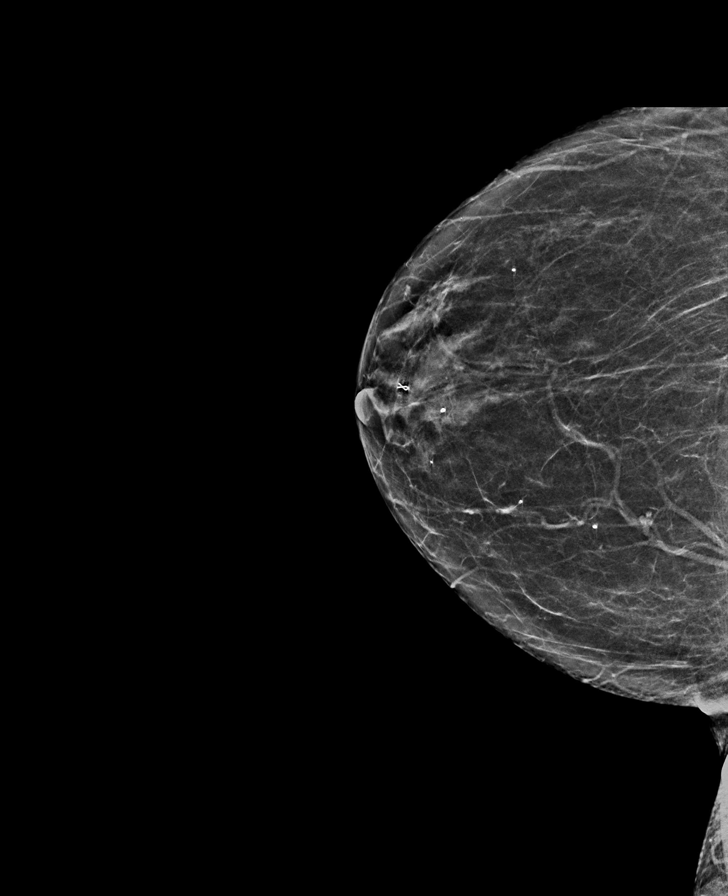

[L MLO synth-2D]
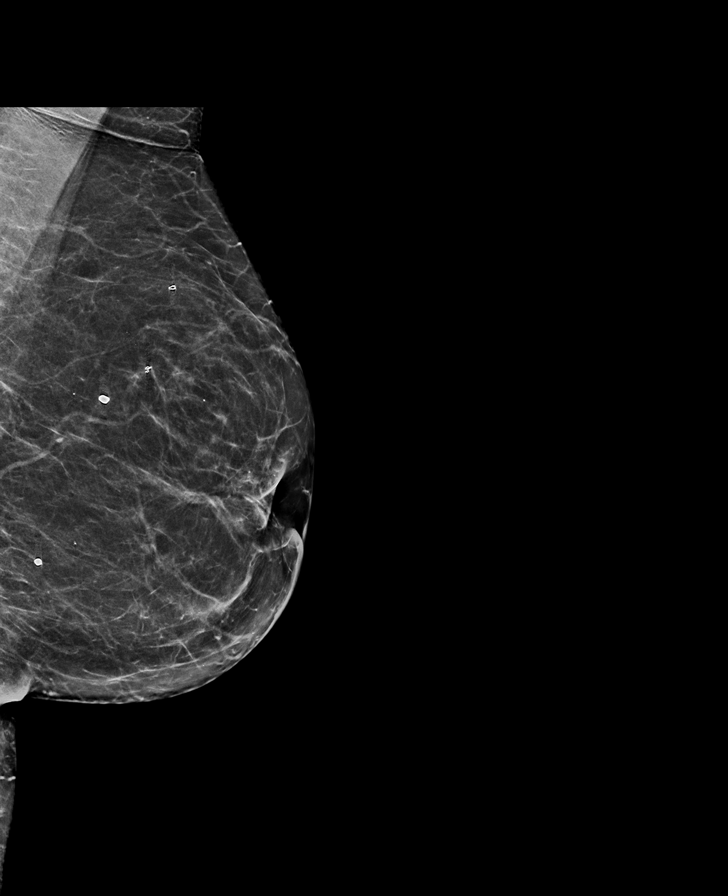

[L MLO tomo · tomo slice 29/58.0]
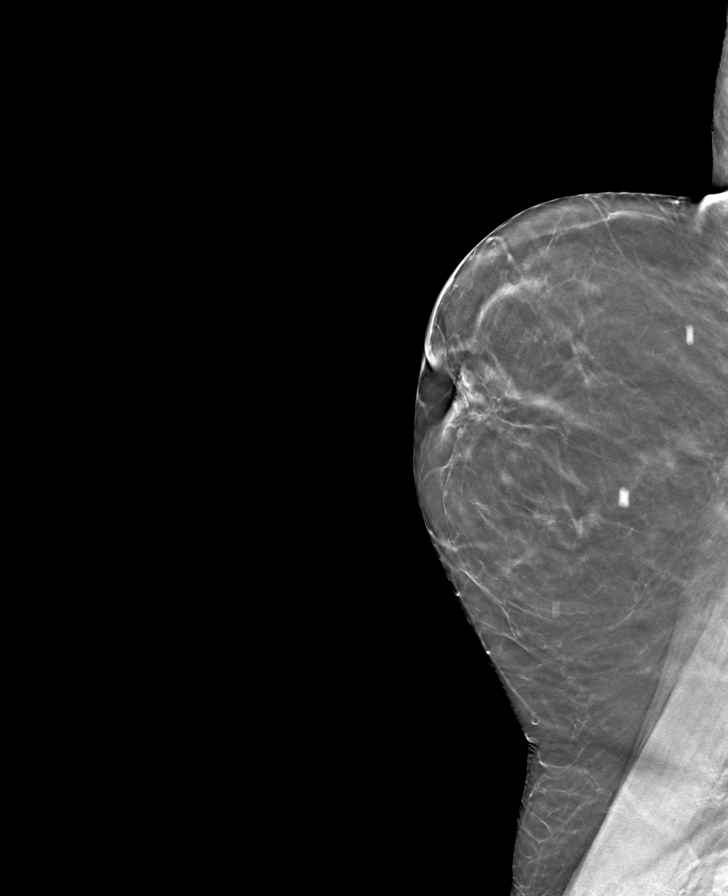

[R MLO tomo · tomo slice 27/53.0]
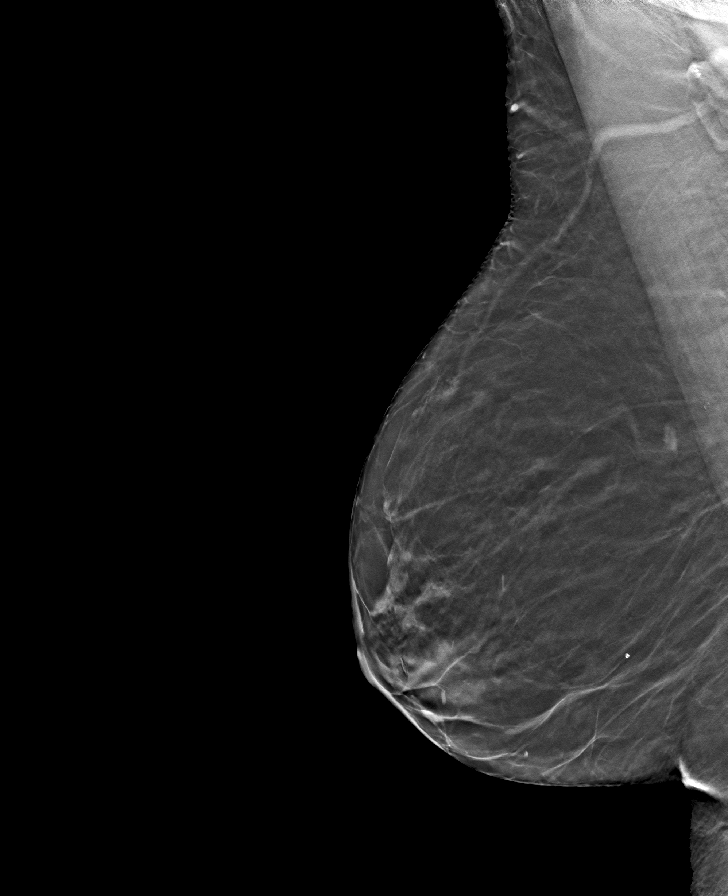

[L CC tomo · tomo slice 32/63.0]
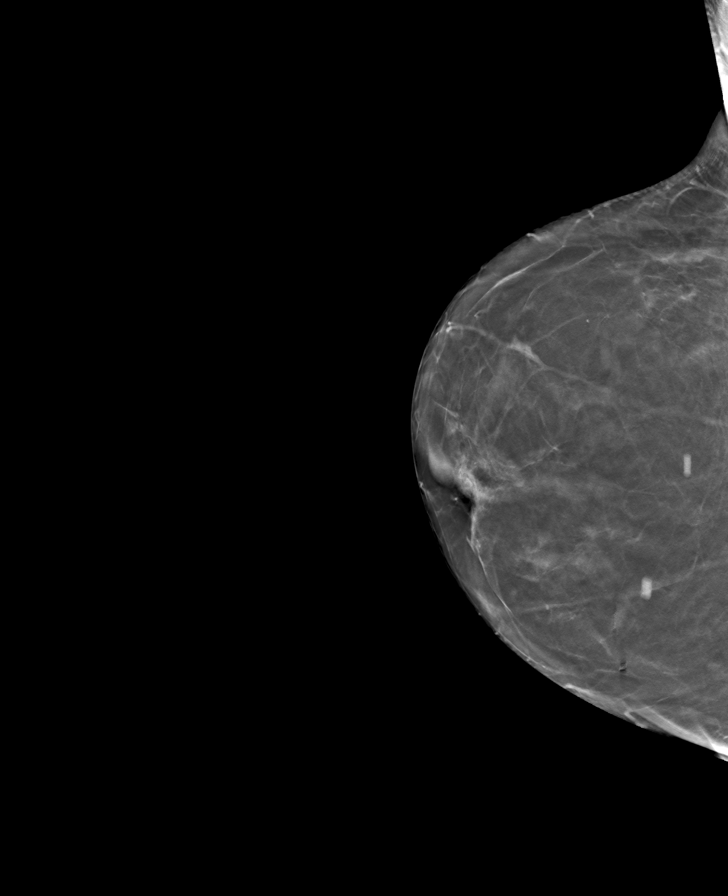

[R CC tomo · tomo slice 27/52.0]
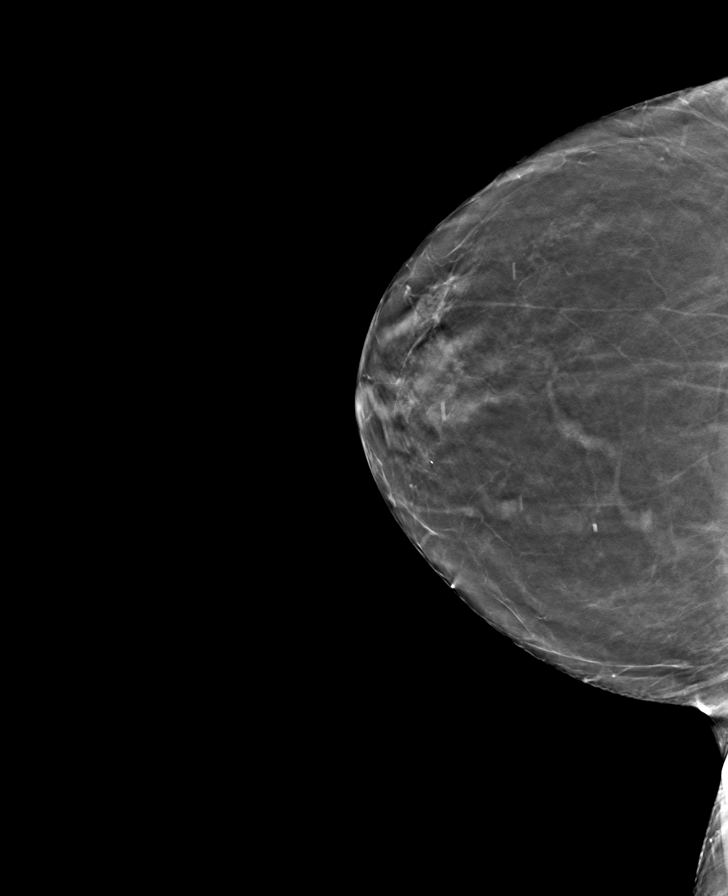

[8 of 24 positions shown; findings below may reference images not displayed]

ACR Breast Density Category b: There are scattered areas of
fibroglandular density.
FINDINGS: There are no findings suspicious for malignancy. Images were
processed with CAD.
IMPRESSION: No mammographic evidence of malignancy. A result letter of this
screening mammogram will be mailed directly to the patient.

RECOMMENDATION:
Screening mammogram in one year. (Code:[TQ])

BI-RADS CATEGORY  1: Negative.

## 2020-05-04 ENCOUNTER — Other Ambulatory Visit: Payer: Self-pay | Admitting: Nurse Practitioner

## 2020-05-04 DIAGNOSIS — M542 Cervicalgia: Secondary | ICD-10-CM

## 2020-05-04 DIAGNOSIS — G8929 Other chronic pain: Secondary | ICD-10-CM

## 2020-05-22 ENCOUNTER — Ambulatory Visit: Payer: 59

## 2020-06-02 ENCOUNTER — Ambulatory Visit: Payer: 59

## 2020-06-02 ENCOUNTER — Other Ambulatory Visit: Payer: Self-pay

## 2020-06-02 ENCOUNTER — Ambulatory Visit
Admission: RE | Admit: 2020-06-02 | Discharge: 2020-06-02 | Disposition: A | Discharge status: Home / Self Care | Payer: 59 | Source: Ambulatory Visit | Attending: Nurse Practitioner | Admitting: Nurse Practitioner

## 2020-06-02 DIAGNOSIS — M5441 Lumbago with sciatica, right side: Secondary | ICD-10-CM | POA: Insufficient documentation

## 2020-06-02 DIAGNOSIS — M5442 Lumbago with sciatica, left side: Secondary | ICD-10-CM | POA: Diagnosis present

## 2020-06-02 DIAGNOSIS — M542 Cervicalgia: Secondary | ICD-10-CM | POA: Diagnosis not present

## 2020-06-02 DIAGNOSIS — G8929 Other chronic pain: Secondary | ICD-10-CM | POA: Insufficient documentation

## 2020-06-02 IMAGING — MR MR CERVICAL SPINE W/O CM
6 series · 42 of 48 positions shown · non-contrast
Comparison: None.

CLINICAL DATA: Chronic neck and back pain.  Bilateral arm pain.

EXAM:
MRI CERVICAL SPINE WITHOUT CONTRAST
TECHNIQUE: Multiplanar, multisequence MR imaging of the cervical spine was
performed. No intravenous contrast was administered.

[Series 5: T2 · sagittal · 3.0mm · 0.62mm/px · 6 of 15 slices shown (1 of 3)]
[im 1/15]
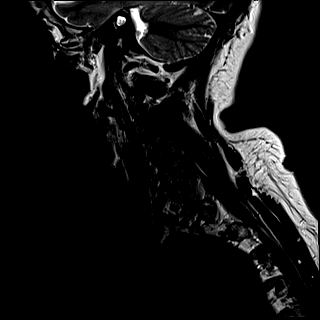
[im 3/15]
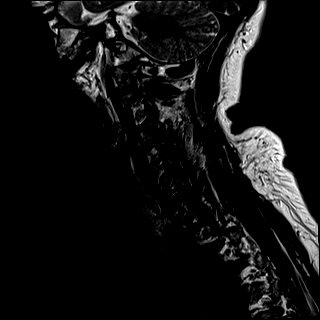
[im 6/15]
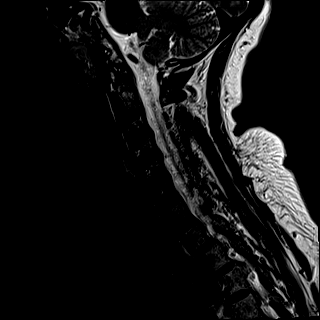
[im 9/15]
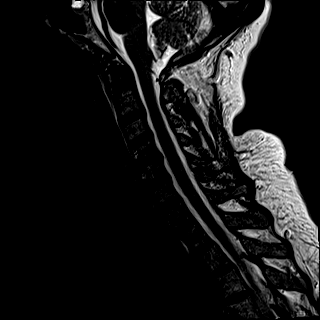
[im 12/15]
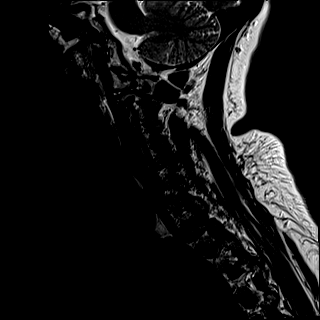
[im 15/15]
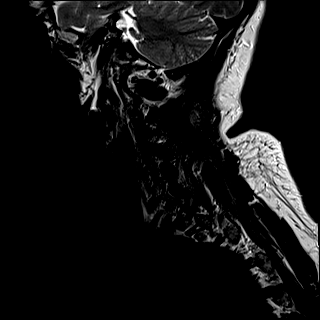

[Series 6: FLAIR · sagittal · 3.0mm · 0.78mm/px · 5 of 15 slices shown]
[im 1/15]
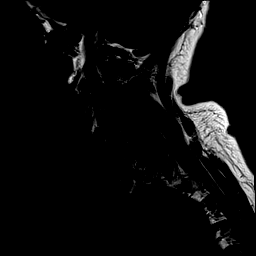
[im 4/15]
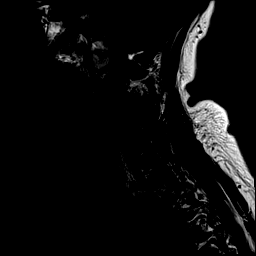
[im 8/15]
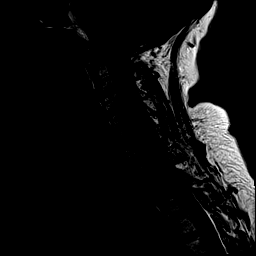
[im 11/15]
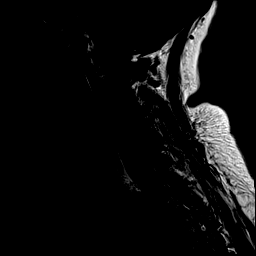
[im 15/15]
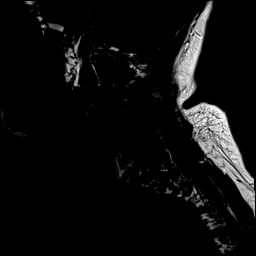

[Series 7: STIR · sagittal · 3.0mm · 0.62mm/px · 5 of 15 slices shown]
[im 1/15]
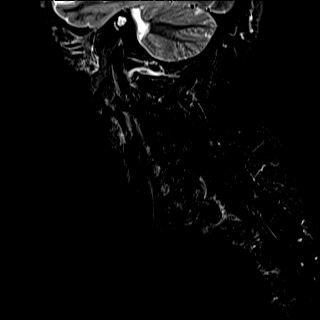
[im 4/15]
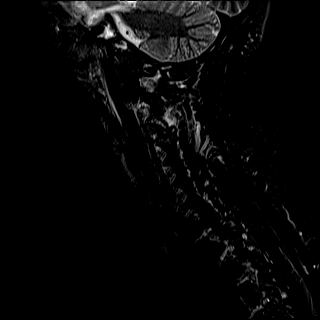
[im 8/15]
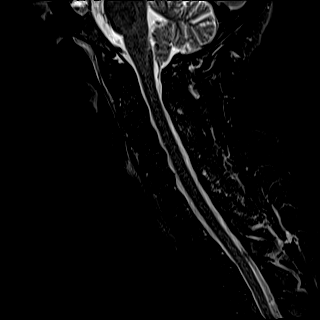
[im 11/15]
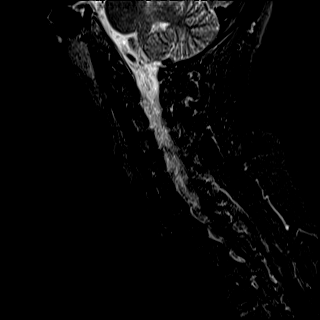
[im 15/15]
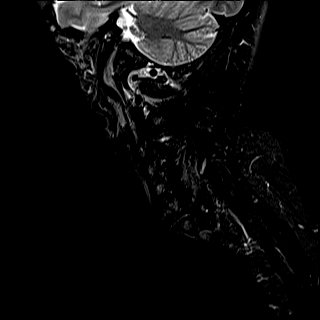

[Series 12: T2 · axial · 3.0mm · 0.70mm/px · z∈[-120,-29]mm · 11 of 29 slices shown (2 of 3)]
[im 1/29]
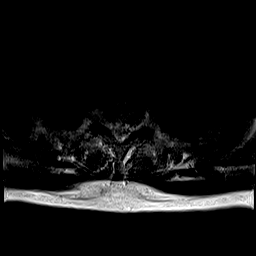
[im 3/29]
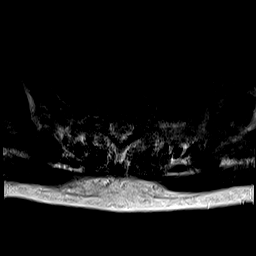
[im 6/29]
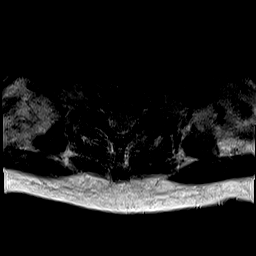
[im 9/29]
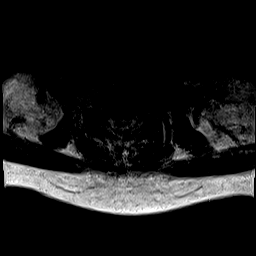
[im 12/29]
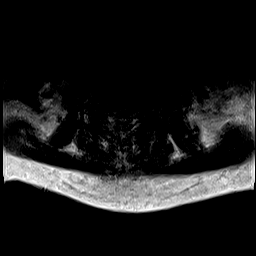
[im 15/29]
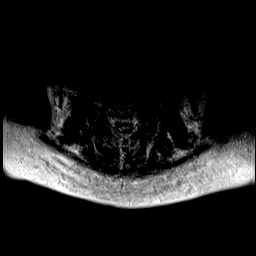
[im 17/29]
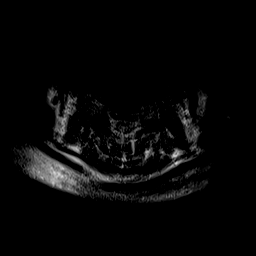
[im 20/29]
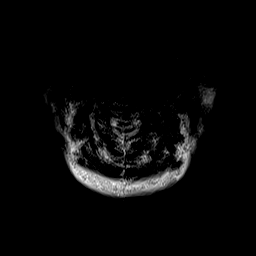
[im 23/29]
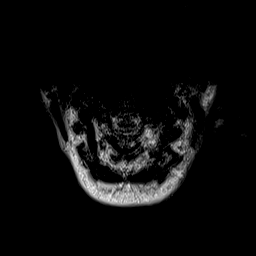
[im 26/29]
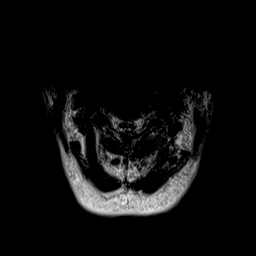
[im 29/29]
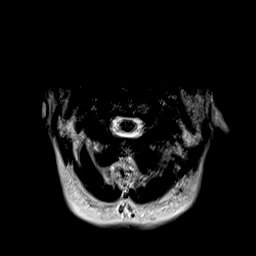

[Series 13: T2 · axial · 3.0mm · 0.70mm/px · z∈[-120,-29]mm · 8 of 28 slices shown (3 of 3)]
[im 1/28]
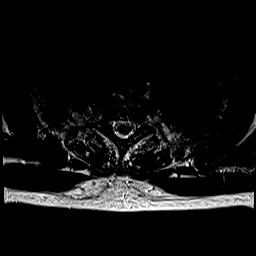
[im 4/28]
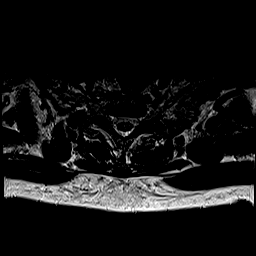
[im 10/28]
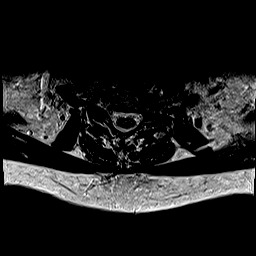
[im 13/28]
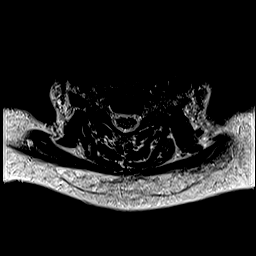
[im 16/28]
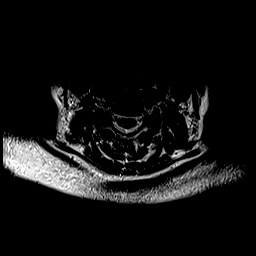
[im 19/28]
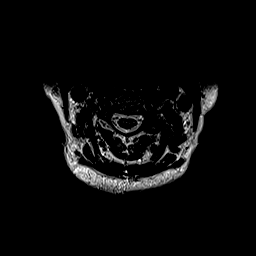
[im 25/28]
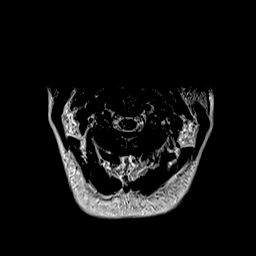
[im 28/28]
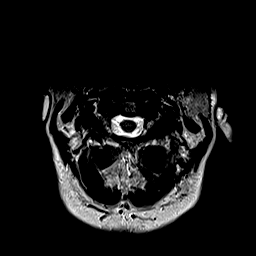

[Series 14: ax mpgr · axial · 3.0mm · 0.35mm/px · z∈[-120,-49]mm · 7 of 29 slices shown]
[im 1/29]
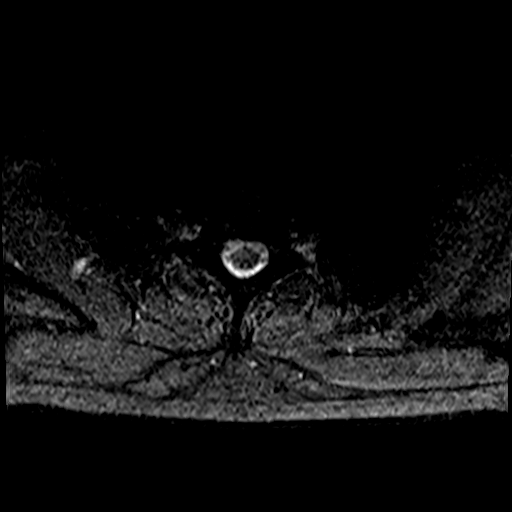
[im 6/29]
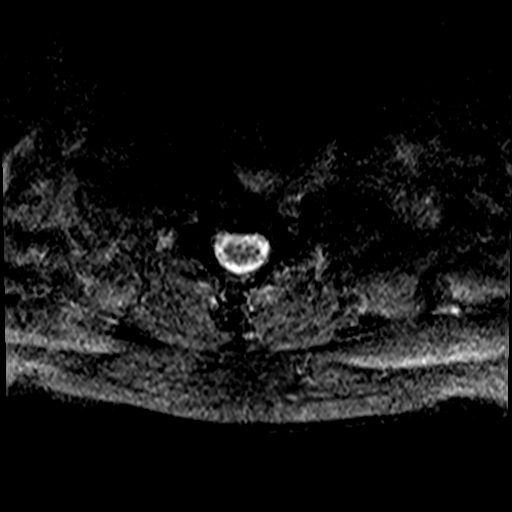
[im 9/29]
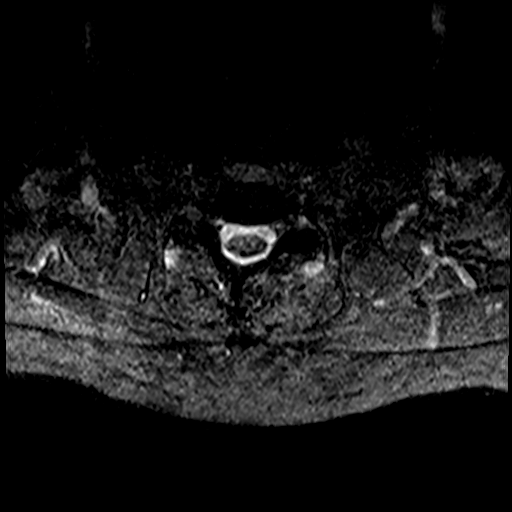
[im 12/29]
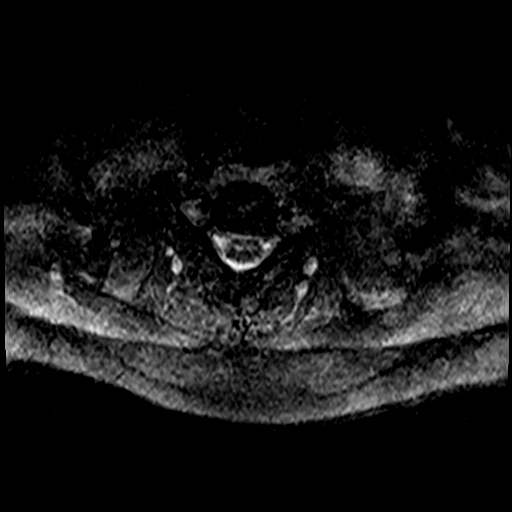
[im 17/29]
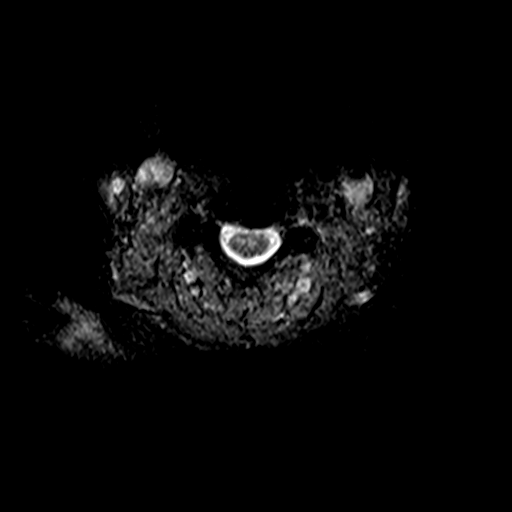
[im 20/29]
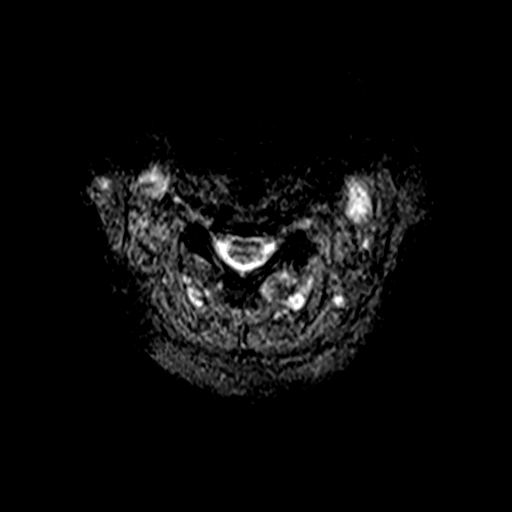
[im 23/29]
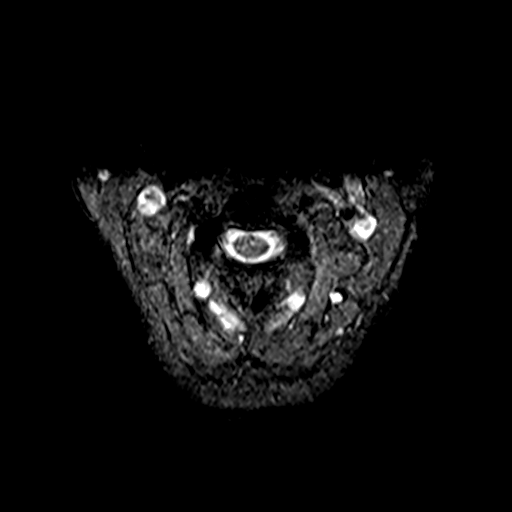

[42 of 48 positions shown; findings below may reference images not displayed]

FINDINGS: Alignment: Loss of the normal cervical lordosis with relative
straightening. No static listhesis.

Vertebrae: No fracture, evidence of discitis, or bone lesion.

Cord: Normal signal and morphology.

Posterior Fossa, vertebral arteries, paraspinal tissues: No acute
paraspinal abnormality.

Disc levels:

Discs: Minimal disc height loss at C4-5. Remainder the disc spaces
are maintained.

C2-3: No significant disc bulge. No neural foraminal stenosis. No
central canal stenosis.

C3-4: Minimal broad-based disc bulge. Moderate left facet
arthropathy with subchondral marrow edema and mild adjacent soft
tissue edema. Mild left foraminal stenosis. No right foraminal
stenosis. No central canal stenosis.

C4-5: Mild broad-based disc bulge. No neural foraminal stenosis. No
central canal stenosis. Mild bilateral facet arthropathy.

C5-6: Mild broad-based disc bulge. No neural foraminal stenosis. No
central canal stenosis.

C6-7: No significant disc bulge. No neural foraminal stenosis. No
central canal stenosis.

C7-T1: No significant disc bulge. No neural foraminal stenosis. No
central canal stenosis.
IMPRESSION: 1. Mild cervical spine spondylosis as described above.
2. Acute osseous injury of the cervical spine.

## 2020-06-04 ENCOUNTER — Other Ambulatory Visit: Payer: Self-pay

## 2020-06-04 ENCOUNTER — Encounter: Payer: Self-pay | Admitting: Student in an Organized Health Care Education/Training Program

## 2020-06-04 ENCOUNTER — Ambulatory Visit
Payer: 59 | Attending: Student in an Organized Health Care Education/Training Program | Admitting: Student in an Organized Health Care Education/Training Program

## 2020-06-04 VITALS — BP 135/100 | HR 104 | Temp 97.1°F | Ht 60.0 in | Wt 140.0 lb

## 2020-06-04 DIAGNOSIS — M810 Age-related osteoporosis without current pathological fracture: Secondary | ICD-10-CM

## 2020-06-04 DIAGNOSIS — M8589 Other specified disorders of bone density and structure, multiple sites: Secondary | ICD-10-CM | POA: Diagnosis present

## 2020-06-04 DIAGNOSIS — M545 Low back pain, unspecified: Secondary | ICD-10-CM

## 2020-06-04 DIAGNOSIS — G894 Chronic pain syndrome: Secondary | ICD-10-CM | POA: Diagnosis present

## 2020-06-04 DIAGNOSIS — M5126 Other intervertebral disc displacement, lumbar region: Secondary | ICD-10-CM

## 2020-06-04 DIAGNOSIS — G8929 Other chronic pain: Secondary | ICD-10-CM | POA: Diagnosis present

## 2020-06-04 DIAGNOSIS — M47816 Spondylosis without myelopathy or radiculopathy, lumbar region: Secondary | ICD-10-CM

## 2020-06-04 DIAGNOSIS — M47812 Spondylosis without myelopathy or radiculopathy, cervical region: Secondary | ICD-10-CM | POA: Diagnosis not present

## 2020-06-04 NOTE — Progress Notes (Signed)
Safety precautions to be maintained throughout the outpatient stay will include: orient to surroundings, keep bed in low position, maintain call bell within reach at all times, provide assistance with transfer out of bed and ambulation.  

## 2020-06-04 NOTE — Progress Notes (Signed)
Patient: Colleen [name]  Service Category: E/M  Provider: Gillis Santa, MD  DOB: Feb 18, 1961  DOS: 06/04/2020  Referring Provider: Lonell Face, NP  MRN: [id number]  Setting: Ambulatory outpatient  PCP: Gae Bon, NP  Type: New Patient  Specialty: Interventional Pain Management    Location: Office  Delivery: Face-to-face     Primary Reason(s) for Visit: Encounter for initial evaluation of one or more chronic problems (new to examiner) potentially causing chronic pain, and posing a threat to normal musculoskeletal function. (Level of risk: High) CC: Back Pain  HPI  Colleen [name] is a 59 y.o. year old, female patient, who comes today to see [place] for the first time for an initial evaluation of her chronic pain. She has Sepsis ([place]); CAP (community acquired pneumonia); COPD (chronic obstructive pulmonary disease) ([place]); GAD (generalized anxiety disorder); Acute on chronic respiratory failure with hypoxia (HCC); COPD exacerbation ([place]); Asthma, chronic, unspecified asthma severity, with acute exacerbation; Panic attack; Chronic pain syndrome; 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, L5/S1); Lumbar facet arthropathy (L4,5 S1); Cervical spondylosis (left C3/4); Osteopenia of multiple sites; and Age related osteoporosis on their problem list. Today she comes in for evaluation of her Back Pain  Pain Assessment: Location: Lower Back Radiating: pain radiaties shoots across and down left leg Onset: More than a month ago Duration: Chronic pain Quality: Shooting, Stabbing Severity: 10-Worst pain ever/10 (subjective, self-reported pain score)  Note: Reported level is inconsistent with clinical observations. Clinically the patient looks like a 2/10 A 2/10 is viewed as "Mild to Moderate" and described as noticeable and distracting. Impossible to hide from other people. More frequent flare-ups. Still possible to adapt and function close to normal. It can be very annoying and may have occasional stronger flare-ups. With  discipline, patients may get used to it and adapt.       When using our objective Pain Scale, levels between 6 and 10/10 are said to belong in an emergency room, as it progressively worsens from a 6/10, described as severely limiting, requiring emergency care not usually available at an outpatient pain management facility. At a 6/10 level, communication becomes difficult and requires great effort. Assistance to reach the emergency department may be required. Facial flushing and profuse sweating along with potentially dangerous increases in heart rate and blood pressure will be evident. Effect on ADL: limits my daily activties Timing: Constant Modifying factors: lay down, heating pad with cushion BP: (!) 135/100  HR: 104  Onset and Duration: Gradual and Date of onset: 6 years ago Cause of pain: Unknown Severity: NAS-11 at its worse: 10/10, NAS-11 at its best: 7/10, NAS-11 now: 10/10 and NAS-11 on the average: 9/10 Timing: Night and Not influenced by the time of the day Aggravating Factors: Bending, Climbing, Kneeling, Lifiting, Motion, Prolonged sitting, Prolonged standing, Squatting, Stooping , Twisting, Walking, Walking uphill, Walking downhill and Working Alleviating Factors: Medications Associated Problems: Night-time cramps, Depression, Fatigue, Inability to concentrate, Nausea, Numbness, Sadness, Spasms, Swelling, Tingling, Weakness, Pain that wakes patient up and Pain that does not allow patient to sleep Quality of Pain: Agonizing, Burning, Disabling, Exhausting, Getting longer, Sharp, Shooting and Stabbing Previous Examinations or Tests: Bone scan and CT scan Previous Treatments: Chiropractic manipulations and Epidural steroid injections  The patient comes into the clinics today for the first time for a chronic pain management evaluation.   Colleen [name] is a very pleasant 59 year old female who presents with a chief complaint of neck pain, low back pain for many years.  She states that her low  back  pain is more debilitating and painful for her.  She states that her symptoms are worsening.  No inciting or traumatic event in the past.  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  She thinks that this is related to position change and bending.  She also endorses interscapular pain in between her shoulder blades.  Of note she does have a history of radiation therapy for breast cancer.  She also has a solitary kidney.  She does have a history of carpal tunnel syndrome.  Of note she has tried epidural steroid injections over 5 years ago x2 in her lower back which she does not recall as being extremely helpful.  She has tried medications including gabapentin, she is currently on Lyrica 75 mg twice daily.  Higher doses resulted in sedation.  She is also tried Flexeril in the past.  She does utilize tramadol seldomly when she has severe pain.  This is most frequently done in the evening time when she has been on her feet throughout the day.  She is being referred from neurosurgery.  She recently had cervical and lumbar MRI performed with results below.   Historic Controlled Substance Pharmacotherapy Review  PMP and historical list of controlled substances:   Tramadol 50 mg twice daily as needed most often nightly.  Historical Monitoring: The patient  reports no history of drug use. List of all UDS Test(s): No results found for: MDMA, COCAINSCRNUR, [place], [place], CANNABQUANT, THCU, [place] List of other Serum/Urine Drug Screening Test(s):  No results found for: AMPHSCRSER, BARBSCRSER, BENZOSCRSER, COCAINSCRSER, COCAINSCRNUR, PCPSCRSER, PCPQUANT, THCSCRSER, THCU, CANNABQUANT, OPIATESCRSER, OXYSCRSER, PROPOXSCRSER, ETH Historical Background Evaluation: [place] PMP: PDMP reviewed during this encounter. Online review of the past 37-monthperiod conducted.             [place] Department of public safety, offender search: (Editor, commissioningInformation) Non-contributory Risk Assessment  Profile: Aberrant behavior: None observed or detected today Risk factors for fatal opioid overdose: None identified today Fatal overdose hazard ratio (HR): Calculation deferred Non-fatal overdose hazard ratio (HR): Calculation deferred Risk of opioid abuse or dependence: 0.7-3.0% with doses ? 36 MME/day and 6.1-26% with doses ? 120 MME/day. Substance use disorder (SUD) risk level: See below Personal History of Substance Abuse (SUD-Substance use disorder):  Alcohol: Negative  Illegal Drugs: Negative  Rx Drugs: Negative  ORT Risk Level calculation: Low Risk  Opioid Risk Tool - 06/04/20 1019      Family History of Substance Abuse   Alcohol Negative    Illegal Drugs Positive Female    Rx Drugs Negative      Personal History of Substance Abuse   Alcohol Negative    Illegal Drugs Negative    Rx Drugs Negative      Age   Age between 186-45years  No      History of Preadolescent Sexual Abuse   History of Preadolescent Sexual Abuse Negative or Female      Psychological Disease   Psychological Disease Negative    Depression Negative      Total Score   Opioid Risk Tool Scoring 2    Opioid Risk Interpretation Low Risk          ORT Scoring interpretation table:  Score <3 = Low Risk for SUD  Score between 4-7 = Moderate Risk for SUD  Score >8 = High Risk for Opioid Abuse   PHQ-2 Depression Scale:  Total score:    PHQ-2 Scoring interpretation table: (Score and probability of  major depressive disorder)  Score 0 = No depression  Score 1 = 15.4% Probability  Score 2 = 21.1% Probability  Score 3 = 38.4% Probability  Score 4 = 45.5% Probability  Score 5 = 56.4% Probability  Score 6 = 78.6% Probability   PHQ-9 Depression Scale:  Total score:    PHQ-9 Scoring interpretation table:  Score 0-4 = No depression  Score 5-9 = Mild depression  Score 10-14 = Moderate depression  Score 15-19 = Moderately severe depression  Score 20-27 = Severe depression (2.4 times higher risk of SUD  and 2.89 times higher risk of overuse)   Pharmacologic Plan: As per protocol, I have not taken over any controlled substance management, pending the results of ordered tests and/or consults.            Initial impression: Pending review of available data and ordered tests.  Meds   Current Outpatient Medications:  .  aspirin EC 81 MG tablet, Take 81 mg by mouth daily., Disp: , Rfl:  .  cetirizine-pseudoephedrine (ZYRTEC-D) 5-120 MG tablet, Take 1 tablet by mouth 2 (two) times daily as needed for allergies., Disp: , Rfl:  .  cyclobenzaprine (FLEXERIL) 10 MG tablet, Take 10 mg by mouth 3 (three) times daily as needed for muscle spasms., Disp: , Rfl:  .  denosumab (PROLIA) 60 MG/ML SOSY injection, Inject 60 mg into the skin every 6 (six) months., Disp: , Rfl:  .  Fluticasone-Umeclidin-Vilant (TRELEGY ELLIPTA) 100-62.5-25 MCG/INH AEPB, Inhale into the lungs once., Disp: , Rfl:  .  pregabalin (LYRICA) 75 MG capsule, Take 75 mg by mouth 2 (two) times daily., Disp: , Rfl:  .  traMADol (ULTRAM) 50 MG tablet, Take 50 mg by mouth every 6 (six) hours as needed., Disp: , Rfl:  .  VENTOLIN HFA 108 (90 Base) MCG/ACT inhaler, Inhale 1-2 puffs into the lungs every 4 (four) hours as needed for cough or wheezing., Disp: , Rfl:   Imaging Review  Cervical Imaging: Cervical MR wo contrast: Results for orders placed during the hospital encounter of 06/02/20  MR CERVICAL SPINE WO CONTRAST  Narrative CLINICAL DATA:  Chronic neck and back pain.  Bilateral arm pain.  EXAM: MRI CERVICAL SPINE WITHOUT CONTRAST  TECHNIQUE: Multiplanar, multisequence MR imaging of the cervical spine was performed. No intravenous contrast was administered.  COMPARISON:  None.  FINDINGS: Alignment: Loss of the normal cervical lordosis with relative straightening. No static listhesis.  Vertebrae: No fracture, evidence of discitis, or bone lesion.  Cord: Normal signal and morphology.  Posterior Fossa, vertebral arteries,  paraspinal tissues: No acute paraspinal abnormality.  Disc levels:  Discs: Minimal disc height loss at C4-5. Remainder the disc spaces are maintained.  C2-3: No significant disc bulge. No neural foraminal stenosis. No central canal stenosis.  C3-4: Minimal broad-based disc bulge. Moderate left facet arthropathy with subchondral marrow edema and mild adjacent soft tissue edema. Mild left foraminal stenosis. No right foraminal stenosis. No central canal stenosis.  C4-5: Mild broad-based disc bulge. No neural foraminal stenosis. No central canal stenosis. Mild bilateral facet arthropathy.  C5-6: Mild broad-based disc bulge. No neural foraminal stenosis. No central canal stenosis.  C6-7: No significant disc bulge. No neural foraminal stenosis. No central canal stenosis.  C7-T1: No significant disc bulge. No neural foraminal stenosis. No central canal stenosis.  IMPRESSION: 1. Mild cervical spine spondylosis as described above. 2. Acute osseous injury of the cervical spine.   Electronically Signed By: Kathreen Devoid On: 06/03/2020 07:18  Lumbosacral Imaging: Lumbar MR wo contrast: Results for orders placed during the hospital encounter of 06/02/20  MR LUMBAR SPINE WO CONTRAST  Narrative CLINICAL DATA:  No known injury, chronic neck and back pain.  EXAM: MRI LUMBAR SPINE WITHOUT CONTRAST  TECHNIQUE: Multiplanar, multisequence MR imaging of the lumbar spine was performed. No intravenous contrast was administered.  COMPARISON:  None.  FINDINGS: Segmentation:  Standard.  Alignment:  Physiologic.  Vertebrae: No fracture, evidence of discitis, or bone lesion. Small Schmorl's node the superior endplate of Z61.  Conus medullaris and cauda equina: Conus extends to the L1 level. Conus and cauda equina appear normal.  Paraspinal and other soft tissues: No acute paraspinal abnormality.  Disc levels:  Disc spaces: Degenerative disease with disc height loss at  L5-S1.  T12-L1: No significant disc bulge. No evidence of neural foraminal stenosis. No central canal stenosis.  L1-L2: No significant disc bulge. No evidence of neural foraminal stenosis. No central canal stenosis.  L2-L3: Minimal broad-based disc bulge. No evidence of neural foraminal stenosis. No central canal stenosis.  L3-L4: Mild broad-based disc. No evidence of neural foraminal stenosis. No central canal stenosis.  L4-L5: Mild broad-based disc bulge with a right foraminal/lateral disc protrusion. Mild right foraminal narrowing. No left foraminal narrowing. No central canal stenosis.  L5-S1: Broad-based disc bulge with a small central disc protrusion. Mild bilateral facet arthropathy. Mild bilateral foraminal stenosis. No central canal stenosis.  IMPRESSION: 1. At L4-5 there is a mild broad-based disc bulge with a right foraminal/lateral disc protrusion. Mild right foraminal narrowing. 2. At L5-S1 there is a broad-based disc bulge with a small central disc protrusion. Mild bilateral facet arthropathy. Mild bilateral foraminal stenosis.   Electronically Signed By: Kathreen Devoid On: 06/03/2020 07:13         Complexity Note: Imaging results reviewed. Results shared with Colleen [name], using Layman's terms.                         ROS  Cardiovascular: Daily Aspirin intake Pulmonary or Respiratory: Lung problems, Wheezing and difficulty taking a deep full breath (Asthma), Smoking and Coughing up mucus (Bronchitis) Neurological: No reported neurological signs or symptoms such as seizures, abnormal skin sensations, urinary and/or fecal incontinence, being born with an abnormal open spine and/or a tethered spinal cord Psychological-Psychiatric: Anxiousness, Depressed, Prone to panicking and History of abuse Gastrointestinal: No reported gastrointestinal signs or symptoms such as vomiting or evacuating blood, reflux, heartburn, alternating episodes of diarrhea and  constipation, inflamed or scarred liver, or pancreas or irrregular and/or infrequent bowel movements Genitourinary: Kidney disease,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 Hematological: No reported hematological signs or symptoms such as prolonged bleeding, low or poor functioning platelets, bruising or bleeding easily, hereditary bleeding problems, low energy levels due to low hemoglobin or being anemic Endocrine: No reported endocrine signs or symptoms such as high or low blood sugar, rapid heart rate due to high thyroid levels, obesity or weight gain due to slow thyroid or thyroid disease Rheumatologic: Joint aches and or swelling due to excess weight (Osteoarthritis) Musculoskeletal: Negative for myasthenia gravis, muscular dystrophy, multiple sclerosis or malignant hyperthermia Work History: Quit going to work on his/her own  Allergies  Colleen [name] is allergic to morphine and related, penicillins, tamoxifen, clonazepam, codeine, hydroxyzine, and sulfa antibiotics.  Laboratory Chemistry Profile   Renal Lab Results  Component Value Date   BUN 13 03/21/2020   CREATININE 0.53 03/21/2020   GFRAA >60 03/21/2020  GFRNONAA >60 03/21/2020   PROTEINUR NEGATIVE 10/12/2018     Electrolytes Lab Results  Component Value Date   NA 132 (L) 03/21/2020   K 4.3 03/21/2020   CL 98 03/21/2020   CALCIUM 8.9 03/21/2020     Hepatic Lab Results  Component Value Date   AST 7 (L) 11/18/2019   ALT 14 11/18/2019   ALBUMIN 4.0 11/18/2019   ALKPHOS 50 11/18/2019     ID Lab Results  Component Value Date   HIV Non Reactive 03/21/2020   [place] NEGATIVE 03/20/2020   MRSAPCR NEGATIVE 10/12/2018     Bone No results found for: VD25OH, FX902IO9BDZ, HG9924QA8, TM1962IW9, 25OHVITD1, 25OHVITD2, 25OHVITD3, TESTOFREE, TESTOSTERONE   Endocrine Lab Results  Component Value Date   GLUCOSE 147 (H) 03/21/2020   GLUCOSEU 50 (A) 10/12/2018   HGBA1C 5.4 10/12/2018     Neuropathy Lab Results   Component Value Date   HGBA1C 5.4 10/12/2018   HIV Non Reactive 03/21/2020     CNS No results found for: COLORCSF, APPEARCSF, RBCCOUNTCSF, WBCCSF, POLYSCSF, LYMPHSCSF, EOSCSF, PROTEINCSF, GLUCCSF, JCVIRUS, CSFOLI, IGGCSF, LABACHR, ACETBL, LABACHR, ACETBL   Inflammation (CRP: Acute  ESR: Chronic) Lab Results  Component Value Date   LATICACIDVEN 0.78 10/11/2018     Rheumatology No results found for: RF, ANA, LABURIC, URICUR, LYMEIGGIGMAB, LYMEABIGMQN, HLAB27   Coagulation Lab Results  Component Value Date   PLT 225 03/21/2020     Cardiovascular Lab Results  Component Value Date   BNP 34.0 03/20/2020   TROPONINI <0.03 10/11/2018   HGB 13.4 03/21/2020   HCT 41.2 03/21/2020     Screening Lab Results  Component Value Date   SARSCOV2NAA NEGATIVE 03/20/2020   MRSAPCR NEGATIVE 10/12/2018   HIV Non Reactive 03/21/2020     Cancer No results found for: CEA, CA125, LABCA2   Allergens No results found for: ALMOND, APPLE, ASPARAGUS, AVOCADO, BANANA, BARLEY, BASIL, BAYLEAF, GREENBEAN, LIMABEAN, WHITEBEAN, BEEFIGE, REDBEET, BLUEBERRY, BROCCOLI, CABBAGE, MELON, CARROT, CASEIN, CASHEWNUT, CAULIFLOWER, CELERY     Note: Lab results reviewed.   PFSH  Drug: Colleen [name]  reports no history of drug use. Alcohol:  reports previous alcohol use. Tobacco:  reports that she has been smoking cigarettes. She has been smoking about 0.50 packs per day. She has never used smokeless tobacco. Medical:  has a past medical history of Anxiety, Asthma, Breast cancer ([place]) (2004), Chronic back pain, COPD (chronic obstructive pulmonary disease) ([place]), Depression, and Personal history of radiation therapy. Family: family history includes Breast cancer in her cousin and cousin.  Past Surgical History:  Procedure Laterality Date  . BREAST BIOPSY Left 2004   positive  . BREAST BIOPSY Right    neg  . BREAST LUMPECTOMY Left 2004   positive  . MASTECTOMY Left   . MASTECTOMY    . NEPHRECTOMY Left   .  TUBAL LIGATION     Active Ambulatory Problems    Diagnosis Date Noted  . Sepsis ([place]) 10/11/2018  . CAP (community acquired pneumonia) 10/11/2018  . COPD (chronic obstructive pulmonary disease) ([place]) 10/11/2018  . GAD (generalized anxiety disorder) 10/11/2018  . Acute on chronic respiratory failure with hypoxia ([place])   . COPD exacerbation ([place]) 03/20/2020  . Asthma, chronic, unspecified asthma severity, with acute exacerbation 03/20/2020  . Panic attack 03/20/2020  . Chronic pain syndrome 06/04/2020  . 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 Lumbar facet arthropathy (L4,5 S1) 06/04/2020  . Cervical spondylosis (left C3/4) 06/04/2020  . Osteopenia of multiple sites 06/04/2020  . Age  related osteoporosis 06/04/2020   Resolved Ambulatory Problems    Diagnosis Date Noted  . No Resolved Ambulatory Problems   Past Medical History:  Diagnosis Date  . Anxiety   . Asthma   . Breast cancer ([place]) 2004  . Chronic back pain   . Depression   . Personal history of radiation therapy    Constitutional Exam  General appearance: Well nourished, well developed, and well hydrated. In no apparent acute distress Vitals:   06/04/20 1000  BP: (!) 135/100  Pulse: 104  Temp: (!) 97.1 F (36.2 C)  SpO2: 93%  Weight: 140 lb (63.5 kg)  Height: 5' (1.524 m)   BMI Assessment: Estimated body mass index is 27.34 kg/m as calculated from the following:   Height as of this encounter: 5' (1.524 m).   Weight as of this encounter: 140 lb (63.5 kg).  BMI interpretation table: BMI level Category Range association with higher incidence of chronic pain  <18 kg/m2 Underweight   18.5-24.9 kg/m2 Ideal body weight   25-29.9 kg/m2 Overweight Increased incidence by 20%  30-34.9 kg/m2 Obese (Class I) Increased incidence by 68%  35-39.9 kg/m2 Severe obesity (Class II) Increased incidence by 136%  >40 kg/m2 Extreme obesity (Class III) Increased incidence by 254%   Patient's current BMI Ideal Body  weight  Body mass index is 27.34 kg/m. Ideal body weight: 45.5 kg (100 lb 4.9 oz) Adjusted ideal body weight: 52.7 kg (116 lb 3 oz)   BMI Readings from Last 4 Encounters:  06/04/20 27.34 kg/m  03/20/20 27.11 kg/m  11/18/19 28.32 kg/m  07/22/19 26.37 kg/m   Wt Readings from Last 4 Encounters:  06/04/20 140 lb (63.5 kg)  03/20/20 138 lb 12.8 oz (63 kg)  11/18/19 145 lb (65.8 kg)  07/22/19 135 lb (61.2 kg)    Psych/Mental status: Alert, oriented x 3 (person, place, & time)       Eyes: PERLA Respiratory: No evidence of acute respiratory distress  Cervical Spine Exam  Skin & Axial Inspection: No masses, redness, edema, swelling, or associated skin lesions Alignment: Symmetrical Functional ROM: Pain restricted ROM, bilaterally Stability: No instability detected Muscle Tone/Strength: Functionally intact. No obvious neuro-muscular anomalies detected. Sensory (Neurological): Articular pain pattern Palpation: No palpable anomalies              Upper Extremity (UE) Exam    Side: Right upper extremity  Side: Left upper extremity  Skin & Extremity Inspection: Skin color, temperature, and hair growth are WNL. No peripheral edema or cyanosis. No masses, redness, swelling, asymmetry, or associated skin lesions. No contractures.  Skin & Extremity Inspection: Skin color, temperature, and hair growth are WNL. No peripheral edema or cyanosis. No masses, redness, swelling, asymmetry, or associated skin lesions. No contractures.  Functional ROM: Unrestricted ROM          Functional ROM: Unrestricted ROM          Muscle Tone/Strength: Functionally intact. No obvious neuro-muscular anomalies detected.   Muscle Tone/Strength: Functionally intact. No obvious neuro-muscular anomalies detected.  Sensory (Neurological): Unimpaired          Sensory (Neurological): Musculoskeletal pain pattern          Palpation: No palpable anomalies              Palpation: No palpable anomalies              Provocative  Test(s):  Phalen's test: deferred Tinel's test: deferred Apley's scratch test (touch opposite shoulder):  Action 1 (Across  chest): deferred Action 2 (Overhead): deferred Action 3 (LB reach): deferred   Provocative Test(s):  Phalen's test: deferred Tinel's test: deferred Apley's scratch test (touch opposite shoulder):  Action 1 (Across chest): deferred Action 2 (Overhead): deferred Action 3 (LB reach): deferred    Thoracic Spine Area Exam  Skin & Axial Inspection: No masses, redness, or swelling Alignment: Symmetrical Functional ROM: Unrestricted ROM Stability: No instability detected Muscle Tone/Strength: Functionally intact. No obvious neuro-muscular anomalies detected. Sensory (Neurological): Unimpaired Muscle strength & Tone: No palpable anomalies  Lumbar Exam  Skin & Axial Inspection: No masses, redness, or swelling Alignment: Symmetrical Functional ROM: Pain restricted ROM affecting both sides Stability: No instability detected Muscle Tone/Strength: Functionally intact. No obvious neuro-muscular anomalies detected. Sensory (Neurological): Dermatomal pain pattern and also musculoskeletal Palpation: No palpable anomalies       Provocative Tests: Hyperextension/rotation test: (+) bilaterally for facet joint pain. Lumbar quadrant test (Kemp's test): (+) bilaterally for facet joint pain. Lateral bending test: deferred today       Patrick's Maneuver: deferred today                   FABER* test: deferred today                   S-I anterior distraction/compression test: deferred today         S-I lateral compression test: deferred today         S-I Thigh-thrust test: deferred today         S-I Gaenslen's test: deferred today         *(Flexion, ABduction and External Rotation)  Gait & Posture Assessment  Ambulation: Unassisted Gait: Relatively normal for age and body habitus Posture: WNL   Lower Extremity Exam    Side: Right lower extremity  Side: Left lower extremity   Stability: No instability observed          Stability: No instability observed          Skin & Extremity Inspection: Skin color, temperature, and hair growth are WNL. No peripheral edema or cyanosis. No masses, redness, swelling, asymmetry, or associated skin lesions. No contractures.  Skin & Extremity Inspection: Skin color, temperature, and hair growth are WNL. No peripheral edema or cyanosis. No masses, redness, swelling, asymmetry, or associated skin lesions. No contractures.  Functional ROM: Unrestricted ROM                  Functional ROM: Unrestricted ROM                  Muscle Tone/Strength: Functionally intact. No obvious neuro-muscular anomalies detected.  Muscle Tone/Strength: Functionally intact. No obvious neuro-muscular anomalies detected.  Sensory (Neurological): Unimpaired        Sensory (Neurological): Unimpaired        DTR: Patellar: deferred today Achilles: deferred today Plantar: deferred today  DTR: Patellar: deferred today Achilles: deferred today Plantar: deferred today  Palpation: No palpable anomalies  Palpation: No palpable anomalies   Assessment  Primary Diagnosis & Pertinent Problem List: The primary encounter diagnosis was Chronic pain syndrome. Diagnoses of 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, L5/S1), Lumbar facet arthropathy (L4,5 S1), Cervical spondylosis (left C3/4), Osteopenia of multiple sites, Age related osteoporosis, unspecified pathological fracture presence, and Chronic bilateral low back pain without sciatica were also pertinent to this visit.  Visit Diagnosis (New problems to examiner): 1. Chronic pain syndrome   2. 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, L5/S1)   3. Lumbar facet arthropathy (L4,5 S1)  4. Cervical spondylosis (left C3/4)   5. Osteopenia of multiple sites   6. Age related osteoporosis, unspecified pathological fracture presence   7. Chronic bilateral low back pain without sciatica    Plan of Care (Initial workup plan)  Note: Colleen [name] was  reminded that as per protocol, today's visit has been an evaluation only. We have not taken over the patient's controlled substance management.   59 year old female presents with a chief complaint of low back, neck and mid scapular pain that is been present for many years but has gotten worse.  Patient was previously seen by a pain clinic in [place] where she moved from approximately 5 years ago.  Lumbar MRI shows disc herniation at L4-L5 with lumbar facet arthropathy at L4-L5 and L5-S1.  Although she has had lumbar epidural steroid injections in the past, she does not recall what type they were: Transforaminal versus interlaminar.  Given multilevel disc herniation, discussed diagnostic interlaminar epidural steroid injection at L4-L5.  Patient could also be a candidate for diagnostic lumbar facet medial branch nerve blocks as she does have pain with facet loading and radiographic evidence of facet arthropathy.  We will start with epidural steroid injection first.  Risk and benefits reviewed and patient would like to proceed.  In regards to medication management, will have patient complete baseline urine toxicology screen.  She states that she takes tramadol very seldomly when she has severe pain flare.  She does not need a psychology assessment for risk evaluation prior.  So long as her urine toxicology screen is appropriate, will take the patient on for chronic opioid therapy with tramadol 50 mg twice daily.  She is instructed to continue her Lyrica 75 mg twice daily.  I also recommend the patient do physical therapy for her low back.   Lab Orders     Compliance Drug Analysis, Ur  Referral Orders     Ambulatory referral to Physical Therapy  Procedure Orders     Lumbar Epidural Injection  Pharmacological management options:  Opioid Analgesics: The patient was informed that there is no guarantee that she would be a candidate for opioid analgesics. The decision will be made following CDC  guidelines. This decision will be based on the results of diagnostic studies, as well as Colleen [name]'s risk profile.  Tramadol 50 mg twice daily  Membrane stabilizer: Currently on Lyrica 75 mg twice daily.  Higher dose caused sedation.  Has tried gabapentin in the past.  Muscle relaxant: Has tried Flexeril in the past.  Can consider tizanidine.  NSAID: To be determined at a later time  Other analgesic(s): To be determined at a later time   Interventional management options: Colleen [name] was informed that there is no guarantee that she would be a candidate for interventional therapies. The decision will be based on the results of diagnostic studies, as well as Colleen [name]'s risk profile.  Procedure(s) under consideration:  Lumbar epidural steroid injection Lumbar facet medial branch nerve block Sprint peripheral nerve stimulation of lumbar medial branch nerve Left cervical facet medial branch nerve block Trigger point injection   Provider-requested follow-up: Return in about 1 week (around 06/11/2020) for L-ESI with PO Valium.  No future appointments.  Note by: Gillis Santa, MD Date: 06/04/2020; Time: 1:05 PM

## 2020-06-04 NOTE — Patient Instructions (Signed)
GENERAL RISKS AND COMPLICATIONS  What are the risk, side effects and possible complications? Generally speaking, most procedures are safe.  However, with any procedure there are risks, side effects, and the possibility of complications.  The risks and complications are dependent upon the sites that are lesioned, or the type of nerve block to be performed.  The closer the procedure is to the spine, the more serious the risks are.  Great care is taken when placing the radio frequency needles, block needles or lesioning probes, but sometimes complications can occur. 1. Infection: Any time there is an injection through the skin, there is a risk of infection.  This is why sterile conditions are used for these blocks.  There are four possible types of infection. 1. Localized skin infection. 2. Central Nervous System Infection-This can be in the form of Meningitis, which can be deadly. 3. Epidural Infections-This can be in the form of an epidural abscess, which can cause pressure inside of the spine, causing compression of the spinal cord with subsequent paralysis. This would require an emergency surgery to decompress, and there are no guarantees that the patient would recover from the paralysis. 4. Discitis-This is an infection of the intervertebral discs.  It occurs in about 1% of discography procedures.  It is difficult to treat and it may lead to surgery.        2. Pain: the needles have to go through skin and soft tissues, will cause soreness.       3. Damage to internal structures:  The nerves to be lesioned may be near blood vessels or    other nerves which can be potentially damaged.       4. Bleeding: Bleeding is more common if the patient is taking blood thinners such as  aspirin, Coumadin, Ticiid, Plavix, etc., or if he/she have some genetic predisposition  such as hemophilia. Bleeding into the spinal canal can cause compression of the spinal  cord with subsequent paralysis.  This would require an  emergency surgery to  decompress and there are no guarantees that the patient would recover from the  paralysis.       5. Pneumothorax:  Puncturing of a lung is a possibility, every time a needle is introduced in  the area of the chest or upper back.  Pneumothorax refers to free air around the  collapsed lung(s), inside of the thoracic cavity (chest cavity).  Another two possible  complications related to a similar event would include: Hemothorax and Chylothorax.   These are variations of the Pneumothorax, where instead of air around the collapsed  lung(s), you may have blood or chyle, respectively.       6. Spinal headaches: They may occur with any procedures in the area of the spine.       7. Persistent CSF (Cerebro-Spinal Fluid) leakage: This is a rare problem, but may occur  with prolonged intrathecal or epidural catheters either due to the formation of a fistulous  track or a dural tear.       8. Nerve damage: By working so close to the spinal cord, there is always a possibility of  nerve damage, which could be as serious as a permanent spinal cord injury with  paralysis.       9. Death:  Although rare, severe deadly allergic reactions known as "Anaphylactic  reaction" can occur to any of the medications used.      10. Worsening of the symptoms:  We can always make thing worse.    What are the chances of something like this happening? Chances of any of this occuring are extremely low.  By statistics, you have more of a chance of getting killed in a motor vehicle accident: while driving to the hospital than any of the above occurring .  Nevertheless, you should be aware that they are possibilities.  In general, it is similar to taking a shower.  Everybody knows that you can slip, hit your head and get killed.  Does that mean that you should not shower again?  Nevertheless always keep in mind that statistics do not mean anything if you happen to be on the wrong side of them.  Even if a procedure has a 1  (one) in a 1,000,000 (million) chance of going wrong, it you happen to be that one..Also, keep in mind that by statistics, you have more of a chance of having something go wrong when taking medications.  Who should not have this procedure? If you are on a blood thinning medication (e.g. Coumadin, Plavix, see list of "Blood Thinners"), or if you have an active infection going on, you should not have the procedure.  If you are taking any blood thinners, please inform your physician.  How should I prepare for this procedure?  Do not eat or drink anything at least six hours prior to the procedure.  Bring a driver with you .  It cannot be a taxi.  Come accompanied by an adult that can drive you back, and that is strong enough to help you if your legs get weak or numb from the local anesthetic.  Take all of your medicines the morning of the procedure with just enough water to swallow them.  If you have diabetes, make sure that you are scheduled to have your procedure done first thing in the morning, whenever possible.  If you have diabetes, take only half of your insulin dose and notify our nurse that you have done so as soon as you arrive at the clinic.  If you are diabetic, but only take blood sugar pills (oral hypoglycemic), then do not take them on the morning of your procedure.  You may take them after you have had the procedure.  Do not take aspirin or any aspirin-containing medications, at least eleven (11) days prior to the procedure.  They may prolong bleeding.  Wear loose fitting clothing that may be easy to take off and that you would not mind if it got stained with Betadine or blood.  Do not wear any jewelry or perfume  Remove any nail coloring.  It will interfere with some of our monitoring equipment.  NOTE: Remember that this is not meant to be interpreted as a complete list of all possible complications.  Unforeseen problems may occur.  BLOOD THINNERS The following drugs  contain aspirin or other products, which can cause increased bleeding during surgery and should not be taken for 2 weeks prior to and 1 week after surgery.  If you should need take something for relief of minor pain, you may take acetaminophen which is found in Tylenol,m Datril, Anacin-3 and Panadol. It is not blood thinner. The products listed below are.  Do not take any of the products listed below in addition to any listed on your instruction sheet.  A.P.C or A.P.C with Codeine Codeine Phosphate Capsules #3 Ibuprofen Ridaura  ABC compound Congesprin Imuran rimadil  Advil Cope Indocin Robaxisal  Alka-Seltzer Effervescent Pain Reliever and Antacid Coricidin or Coricidin-D  Indomethacin Rufen    Alka-Seltzer plus Cold Medicine Cosprin Ketoprofen S-A-C Tablets  Anacin Analgesic Tablets or Capsules Coumadin Korlgesic Salflex  Anacin Extra Strength Analgesic tablets or capsules CP-2 Tablets Lanoril Salicylate  Anaprox Cuprimine Capsules Levenox Salocol  Anexsia-D Dalteparin Magan Salsalate  Anodynos Darvon compound Magnesium Salicylate Sine-off  Ansaid Dasin Capsules Magsal Sodium Salicylate  Anturane Depen Capsules Marnal Soma  APF Arthritis pain formula Dewitt's Pills Measurin Stanback  Argesic Dia-Gesic Meclofenamic Sulfinpyrazone  Arthritis Bayer Timed Release Aspirin Diclofenac Meclomen Sulindac  Arthritis pain formula Anacin Dicumarol Medipren Supac  Analgesic (Safety coated) Arthralgen Diffunasal Mefanamic Suprofen  Arthritis Strength Bufferin Dihydrocodeine Mepro Compound Suprol  Arthropan liquid Dopirydamole Methcarbomol with Aspirin Synalgos  ASA tablets/Enseals Disalcid Micrainin Tagament  Ascriptin Doan's Midol Talwin  Ascriptin A/D Dolene Mobidin Tanderil  Ascriptin Extra Strength Dolobid Moblgesic Ticlid  Ascriptin with Codeine Doloprin or Doloprin with Codeine Momentum Tolectin  Asperbuf Duoprin Mono-gesic Trendar  Aspergum Duradyne Motrin or Motrin IB Triminicin  Aspirin  plain, buffered or enteric coated Durasal Myochrisine Trigesic  Aspirin Suppositories Easprin Nalfon Trillsate  Aspirin with Codeine Ecotrin Regular or Extra Strength Naprosyn Uracel  Atromid-S Efficin Naproxen Ursinus  Auranofin Capsules Elmiron Neocylate Vanquish  Axotal Emagrin Norgesic Verin  Azathioprine Empirin or Empirin with Codeine Normiflo Vitamin E  Azolid Emprazil Nuprin Voltaren  Bayer Aspirin plain, buffered or children's or timed BC Tablets or powders Encaprin Orgaran Warfarin Sodium  Buff-a-Comp Enoxaparin Orudis Zorpin  Buff-a-Comp with Codeine Equegesic Os-Cal-Gesic   Buffaprin Excedrin plain, buffered or Extra Strength Oxalid   Bufferin Arthritis Strength Feldene Oxphenbutazone   Bufferin plain or Extra Strength Feldene Capsules Oxycodone with Aspirin   Bufferin with Codeine Fenoprofen Fenoprofen Pabalate or Pabalate-SF   Buffets II Flogesic Panagesic   Buffinol plain or Extra Strength Florinal or Florinal with Codeine Panwarfarin   Buf-Tabs Flurbiprofen Penicillamine   Butalbital Compound Four-way cold tablets Penicillin   Butazolidin Fragmin Pepto-Bismol   Carbenicillin Geminisyn Percodan   Carna Arthritis Reliever Geopen Persantine   Carprofen Gold's salt Persistin   Chloramphenicol Goody's Phenylbutazone   Chloromycetin Haltrain Piroxlcam   Clmetidine heparin Plaquenil   Cllnoril Hyco-pap Ponstel   Clofibrate Hydroxy chloroquine Propoxyphen         Before stopping any of these medications, be sure to consult the physician who ordered them.  Some, such as Coumadin (Warfarin) are ordered to prevent or treat serious conditions such as "deep thrombosis", "pumonary embolisms", and other heart problems.  The amount of time that you may need off of the medication may also vary with the medication and the reason for which you were taking it.  If you are taking any of these medications, please make sure you notify your pain physician before you undergo any  procedures.         Pain Management Discharge Instructions  General Discharge Instructions :  If you need to reach your doctor call: Monday-Friday 8:00 am - 4:00 pm at [phone] or toll free [phone].  After clinic hours [phone] to have operator reach doctor.  Bring all of your medication bottles to all your appointments in the pain clinic.  To cancel or reschedule your appointment with Pain Management please remember to call 24 hours in advance to avoid a fee.  Refer to the educational materials which you have been given on: General Risks, I had my Procedure. Discharge Instructions, Post Sedation.  Post Procedure Instructions:  The drugs you were given will stay in your system until tomorrow, so for the next 24 hours you should   not drive, make any legal decisions or drink any alcoholic beverages.  You may eat anything you prefer, but it is better to start with liquids then soups and crackers, and gradually work up to solid foods.  Please notify your doctor immediately if you have any unusual bleeding, trouble breathing or pain that is not related to your normal pain.  Depending on the type of procedure that was done, some parts of your body may feel week and/or numb.  This usually clears up by tonight or the next day.  Walk with the use of an assistive device or accompanied by an adult for the 24 hours.  You may use ice on the affected area for the first 24 hours.  Put ice in a Ziploc bag and cover with a towel and place against area 15 minutes on 15 minutes off.  You may switch to heat after 24 hours.Epidural Steroid Injection Patient Information  Description: The epidural space surrounds the nerves as they exit the spinal cord.  In some patients, the nerves can be compressed and inflamed by a bulging disc or a tight spinal canal (spinal stenosis).  By injecting steroids into the epidural space, we can bring irritated nerves into direct contact with a potentially  helpful medication.  These steroids act directly on the irritated nerves and can reduce swelling and inflammation which often leads to decreased pain.  Epidural steroids may be injected anywhere along the spine and from the neck to the low back depending upon the location of your pain.   After numbing the skin with local anesthetic (like Novocaine), a small needle is passed into the epidural space slowly.  You may experience a sensation of pressure while this is being done.  The entire block usually last less than 10 minutes.  Conditions which may be treated by epidural steroids:   Low back and leg pain  Neck and arm pain  Spinal stenosis  Post-laminectomy syndrome  Herpes zoster (shingles) pain  Pain from compression fractures  Preparation for the injection:  1. Do not eat any solid food or dairy products within 8 hours of your appointment.  2. You may drink clear liquids up to 3 hours before appointment.  Clear liquids include water, black coffee, juice or soda.  No milk or cream please. 3. You may take your regular medication, including pain medications, with a sip of water before your appointment  Diabetics should hold regular insulin (if taken separately) and take 1/2 normal NPH dos the morning of the procedure.  Carry some sugar containing items with you to your appointment. 4. A driver must accompany you and be prepared to drive you home after your procedure.  5. Bring all your current medications with your. 6. An IV may be inserted and sedation may be given at the discretion of the physician.   7. A blood pressure cuff, EKG and other monitors will often be applied during the procedure.  Some patients may need to have extra oxygen administered for a short period. 8. You will be asked to provide medical information, including your allergies, prior to the procedure.  We must know immediately if you are taking blood thinners (like Coumadin/Warfarin)  Or if you are allergic to IV iodine  contrast (dye). We must know if you could possible be pregnant.  Possible side-effects:  Bleeding from needle site  Infection (rare, may require surgery)  Nerve injury (rare)  Numbness & tingling (temporary)  Difficulty urinating (rare, temporary)  Spinal headache (   a headache worse with upright posture)  Light -headedness (temporary)  Pain at injection site (several days)  Decreased blood pressure (temporary)  Weakness in arm/leg (temporary)  Pressure sensation in back/neck (temporary)  Call if you experience:  Fever/chills associated with headache or increased back/neck pain.  Headache worsened by an upright position.  New onset weakness or numbness of an extremity below the injection site  Hives or difficulty breathing (go to the emergency room)  Inflammation or drainage at the infection site  Severe back/neck pain  Any new symptoms which are concerning to you  Please note:  Although the local anesthetic injected can often make your back or neck feel good for several hours after the injection, the pain will likely return.  It takes 3-7 days for steroids to work in the epidural space.  You may not notice any pain relief for at least that one week.  If effective, we will often do a series of three injections spaced 3-6 weeks apart to maximally decrease your pain.  After the initial series, we generally will wait several months before considering a repeat injection of the same type.  If you have any questions, please call [phone] [facility]

## 2020-06-08 LAB — COMPLIANCE DRUG ANALYSIS, UR

## 2020-06-11 ENCOUNTER — Encounter: Payer: Self-pay | Admitting: Student in an Organized Health Care Education/Training Program

## 2020-06-11 ENCOUNTER — Ambulatory Visit (HOSPITAL_BASED_OUTPATIENT_CLINIC_OR_DEPARTMENT_OTHER): Payer: 59 | Admitting: Student in an Organized Health Care Education/Training Program

## 2020-06-11 ENCOUNTER — Other Ambulatory Visit: Payer: Self-pay

## 2020-06-11 ENCOUNTER — Ambulatory Visit
Admission: RE | Admit: 2020-06-11 | Discharge: 2020-06-11 | Disposition: A | Discharge status: Home / Self Care | Payer: 59 | Source: Ambulatory Visit | Attending: Student in an Organized Health Care Education/Training Program | Admitting: Student in an Organized Health Care Education/Training Program

## 2020-06-11 ENCOUNTER — Telehealth: Payer: Self-pay | Admitting: *Deleted

## 2020-06-11 DIAGNOSIS — M5126 Other intervertebral disc displacement, lumbar region: Secondary | ICD-10-CM | POA: Diagnosis not present

## 2020-06-11 DIAGNOSIS — Z905 Acquired absence of kidney: Secondary | ICD-10-CM | POA: Insufficient documentation

## 2020-06-11 DIAGNOSIS — Z882 Allergy status to sulfonamides status: Secondary | ICD-10-CM | POA: Insufficient documentation

## 2020-06-11 DIAGNOSIS — Z9012 Acquired absence of left breast and nipple: Secondary | ICD-10-CM | POA: Insufficient documentation

## 2020-06-11 DIAGNOSIS — Z885 Allergy status to narcotic agent status: Secondary | ICD-10-CM | POA: Insufficient documentation

## 2020-06-11 DIAGNOSIS — G894 Chronic pain syndrome: Secondary | ICD-10-CM | POA: Diagnosis present

## 2020-06-11 DIAGNOSIS — G8929 Other chronic pain: Secondary | ICD-10-CM

## 2020-06-11 DIAGNOSIS — Z9851 Tubal ligation status: Secondary | ICD-10-CM | POA: Insufficient documentation

## 2020-06-11 DIAGNOSIS — Z880 Allergy status to penicillin: Secondary | ICD-10-CM | POA: Diagnosis not present

## 2020-06-11 DIAGNOSIS — M545 Low back pain, unspecified: Secondary | ICD-10-CM

## 2020-06-11 MED ORDER — DEXAMETHASONE SODIUM PHOSPHATE 10 MG/ML IJ SOLN
10.0000 mg | Freq: Once | INTRAMUSCULAR | Status: AC
  Administered 2020-06-11: 10 mg

## 2020-06-11 MED ORDER — SODIUM CHLORIDE (PF) 0.9 % IJ SOLN
INTRAMUSCULAR | Status: AC
  Filled 2020-06-11: qty 10

## 2020-06-11 MED ORDER — ROPIVACAINE HCL 2 MG/ML IJ SOLN
INTRAMUSCULAR | Status: AC
  Filled 2020-06-11: qty 10

## 2020-06-11 MED ORDER — LIDOCAINE HCL 2 % IJ SOLN
20.0000 mL | Freq: Once | INTRAMUSCULAR | Status: AC
  Administered 2020-06-11: 400 mg

## 2020-06-11 MED ORDER — TRAMADOL HCL 50 MG PO TABS: 50.0000 mg | ORAL_TABLET | Freq: Every day | ORAL | 0 refills | Status: AC | PRN

## 2020-06-11 MED ORDER — IOHEXOL 180 MG/ML  SOLN
INTRAMUSCULAR | Status: AC
  Filled 2020-06-11: qty 20

## 2020-06-11 MED ORDER — IOHEXOL 180 MG/ML  SOLN
10.0000 mL | Freq: Once | INTRAMUSCULAR | Status: AC
  Administered 2020-06-11: 10 mL via EPIDURAL

## 2020-06-11 MED ORDER — DIAZEPAM 5 MG PO TABS: 5.0000 mg | ORAL_TABLET | Freq: Once | ORAL | Status: DC

## 2020-06-11 MED ORDER — ROPIVACAINE HCL 2 MG/ML IJ SOLN
2.0000 mL | Freq: Once | INTRAMUSCULAR | Status: AC
  Administered 2020-06-11: 2 mL via EPIDURAL

## 2020-06-11 MED ORDER — LIDOCAINE HCL 2 % IJ SOLN
INTRAMUSCULAR | Status: AC
  Filled 2020-06-11: qty 10

## 2020-06-11 MED ORDER — DEXAMETHASONE SODIUM PHOSPHATE 10 MG/ML IJ SOLN
INTRAMUSCULAR | Status: AC
  Filled 2020-06-11: qty 1

## 2020-06-11 MED ORDER — DIAZEPAM 5 MG PO TABS
ORAL_TABLET | ORAL | Status: AC
  Filled 2020-06-11: qty 1

## 2020-06-11 MED ORDER — SODIUM CHLORIDE 0.9% FLUSH
2.0000 mL | Freq: Once | INTRAVENOUS | Status: AC
  Administered 2020-06-11: 2 mL

## 2020-06-11 NOTE — Progress Notes (Signed)
PROVIDER NOTE: Information contained herein reflects review and annotations entered in association with encounter. Interpretation of such information and data should be left to medically-trained personnel. Information provided to patient can be located elsewhere in the medical record under "Patient Instructions". Document created using STT-dictation technology, any transcriptional errors that may result from process are unintentional.    Patient: Colleen [name]  Service Category: Procedure  Provider: Gillis Santa, MD  DOB: 06-Apr-1961  DOS: 06/11/2020  Location: [facility]  MRN: [id number]  Setting: Ambulatory - outpatient  Referring Provider: Gillis Santa, MD  Type: Established Patient  Specialty: Interventional Pain Management  PCP: Gae Bon, NP   Primary Reason for Visit: Interventional Pain Management Treatment. CC: Back Pain  Procedure:          Anesthesia, Analgesia, Anxiolysis:  Type: Diagnostic Inter-Laminar Epidural Steroid Injection  #1  Region: Lumbar Level: L4-5 Level. Laterality: [name]         Type: Local Anesthesia w PO Valium  Local Anesthetic: Lidocaine 1-2%  Position: Prone with head of the table was raised to facilitate breathing.   Indications: 1. Chronic pain syndrome   2. 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, L5/S1)   3. Chronic bilateral low back pain without sciatica    Pain Score: Pre-procedure: 8 /10 Post-procedure: 7 /10   Pre-op Assessment:  Colleen [name] is a 59 y.o. (year old), female patient, seen today for interventional treatment. She  has a past surgical history that includes Nephrectomy (Left); Mastectomy (Left); Tubal ligation; Mastectomy; Breast biopsy (Left, 2004); Breast biopsy ([name]); and Breast lumpectomy (Left, 2004). Colleen [name] has a current medication list which includes the following prescription(s): aspirin ec, cetirizine-pseudoephedrine, cyclobenzaprine, denosumab, trelegy ellipta, pregabalin, tramadol, ventolin hfa, and  tramadol, and the following Facility-Administered Medications: diazepam. Her primarily concern today is the Back Pain  Initial Vital Signs:  Pulse/HCG Rate: 91  Temp: (!) 97.2 F (36.2 C) Resp: 18 BP: (!) 129/96 SpO2: 96 %  BMI: Estimated body mass index is 27.34 kg/m as calculated from the following:   Height as of this encounter: 5' (1.524 m).   Weight as of this encounter: 140 lb (63.5 kg).  [name] Assessment: Allergies: Reviewed. She is allergic to morphine and related, penicillins, tamoxifen, clonazepam, codeine, hydroxyzine, and sulfa antibiotics.  Allergy Precautions: None required Coagulopathies: Reviewed. None identified.  Blood-thinner therapy: None at this time Active Infection(s): Reviewed. None identified. Colleen [name] is afebrile  Site Confirmation: Colleen [name] was asked to confirm the procedure and laterality before marking the site Procedure checklist: Completed Consent: Before the procedure and under the influence of no sedative(s), amnesic(s), or anxiolytics, the patient was informed of the treatment options, risks and possible complications. To fulfill our ethical and legal obligations, as recommended by the American Medical Association's Code of Ethics, I have informed the patient of my clinical impression; the nature and purpose of the treatment or procedure; the risks, benefits, and possible complications of the intervention; the alternatives, including doing nothing; the [name](s) and benefit(s) of the alternative treatment(s) or procedure(s); and the [name](s) and benefit(s) of doing nothing. The patient was provided information about the general risks and possible complications associated with the procedure. These may include, but are not limited to: failure to achieve desired goals, infection, bleeding, organ or nerve damage, allergic reactions, paralysis, and death. In addition, the patient was informed of those risks and complications associated to Spine-related  procedures, such as failure to decrease pain; infection (i.e.: Meningitis, epidural or intraspinal abscess); bleeding (i.e.: epidural  hematoma, subarachnoid hemorrhage, or any other type of intraspinal or peri-dural bleeding); organ or nerve damage (i.e.: Any type of peripheral nerve, nerve root, or spinal cord injury) with subsequent damage to sensory, motor, and/or autonomic systems, resulting in permanent pain, numbness, and/or weakness of one or several areas of the body; allergic reactions; (i.e.: anaphylactic reaction); and/or death. Furthermore, the patient was informed of those risks and complications associated with the medications. These include, but are not limited to: allergic reactions (i.e.: anaphylactic or anaphylactoid reaction(s)); adrenal axis suppression; blood sugar elevation that in diabetics may result in ketoacidosis or comma; water retention that in patients with history of congestive heart failure may result in shortness of breath, pulmonary edema, and decompensation with resultant heart failure; weight gain; swelling or edema; medication-induced neural toxicity; particulate matter embolism and blood vessel occlusion with resultant organ, and/or nervous system infarction; and/or aseptic necrosis of one or more joints. Finally, the patient was informed that Medicine is not an exact science; therefore, there is also the possibility of unforeseen or unpredictable risks and/or possible complications that may result in a catastrophic outcome. The patient indicated having understood very clearly. We have given the patient no guarantees and we have made no promises. Enough time was given to the patient to ask questions, all of which were answered to the patient's satisfaction. Colleen [name] has indicated that she wanted to continue with the procedure. Attestation: I, the ordering provider, attest that I have discussed with the patient the benefits, risks, side-effects, alternatives, likelihood of  achieving goals, and potential problems during recovery for the procedure that I have provided informed consent. Date  Time: 06/11/2020  8:40 AM  Pre-Procedure Preparation:  Monitoring: As per clinic protocol. Respiration, ETCO2, SpO2, BP, heart rate and rhythm monitor placed and checked for adequate function Safety Precautions: Patient was assessed for positional comfort and pressure points before starting the procedure. Time-out: I initiated and conducted the "Time-out" before starting the procedure, as per protocol. The patient was asked to participate by confirming the accuracy of the "Time Out" information. Verification of the correct person, site, and procedure were performed and confirmed by me, the nursing staff, and the patient. "Time-out" conducted as per Joint Commission's Universal Protocol (UP.01.01.01). Time: 0925  Description of Procedure:          Target Area: The interlaminar space, initially targeting the lower laminar border of the superior vertebral body. Approach: Paramedial approach. Area Prepped: Entire Posterior Lumbar Region DuraPrep (Iodine Povacrylex [0.7% available iodine] and Isopropyl Alcohol, 74% w/w) Safety Precautions: Aspiration looking for blood return was conducted prior to all injections. At no point did we inject any substances, as a needle was being advanced. No attempts were made at seeking any paresthesias. Safe injection practices and needle disposal techniques used. Medications properly checked for expiration dates. SDV (single dose vial) medications used. Description of the Procedure: Protocol guidelines were followed. The procedure needle was introduced through the skin, ipsilateral to the reported pain, and advanced to the target area. Bone was contacted and the needle walked caudad, until the lamina was cleared. The epidural space was identified using "loss-of-resistance technique" with 2-3 ml of PF-NaCl (0.9% NSS), in a 5cc LOR glass syringe.  Vitals:    06/11/20 0853 06/11/20 0915 06/11/20 0925 06/11/20 0930  BP: (!) 129/96 (!) 131/78 (!) 115/90 (!) 132/100  Pulse: 91 95 96 96  Resp: 18 16 22    Temp: (!) 97.2 F (36.2 C)     TempSrc: Temporal  SpO2: 96% 96% 96% 96%  Weight: 140 lb (63.5 kg)     Height: 5' (1.524 m)       Start Time: 0925 hrs. End Time: 0927 hrs.  Materials:  Needle(s) Type: Epidural needle Gauge: 22G Length: 3.5-in Medication(s): Please see orders for medications and dosing details. 6 cc solution made of 3 cc of preservative-free saline, 2 cc of 0.2% ropivacaine, 1 cc of Decadron 10 mg/cc.  Imaging Guidance (Spinal):          Type of Imaging Technique: Fluoroscopy Guidance (Spinal) Indication(s): Assistance in needle guidance and placement for procedures requiring needle placement in or near specific anatomical locations not easily accessible without such assistance. Exposure Time: Please see nurses notes. Contrast: Before injecting any contrast, we confirmed that the patient did not have an allergy to iodine, shellfish, or radiological contrast. Once satisfactory needle placement was completed at the desired level, radiological contrast was injected. Contrast injected under live fluoroscopy. No contrast complications. See chart for type and volume of contrast used. Fluoroscopic Guidance: I was personally present during the use of fluoroscopy. "Tunnel Vision Technique" used to obtain the best possible view of the target area. Parallax error corrected before commencing the procedure. "Direction-depth-direction" technique used to introduce the needle under continuous pulsed fluoroscopy. Once target was reached, antero-posterior, oblique, and lateral fluoroscopic projection used confirm needle placement in all planes. Images permanently stored in EMR. Interpretation: I personally interpreted the imaging intraoperatively. Adequate needle placement confirmed in multiple planes. Appropriate spread of contrast into  desired area was observed. No evidence of afferent or efferent intravascular uptake. No intrathecal or subarachnoid spread observed. Permanent images saved into the patient's record.  Antibiotic Prophylaxis:   Anti-infectives (From admission, onward)   None     Indication(s): None identified  Post-operative Assessment:  Post-procedure Vital Signs:  Pulse/HCG Rate: 96  Temp: (!) 97.2 F (36.2 C) Resp: 22 BP: (!) 132/100 SpO2: 96 %  EBL: None  Complications: No immediate post-treatment complications observed by team, or reported by patient.  Note: The patient tolerated the entire procedure well. A repeat set of vitals were taken after the procedure and the patient was kept under observation following institutional policy, for this type of procedure. Post-procedural neurological assessment was performed, showing return to baseline, prior to discharge. The patient was provided with post-procedure discharge instructions, including a section on how to identify potential problems. Should any problems arise concerning this procedure, the patient was given instructions to immediately contact us, at any time, without hesitation. In any case, we plan to contact the patient by telephone for a follow-up status report regarding this interventional procedure.  Comments:  No additional relevant information.  Plan of Care  Orders:  Orders Placed This Encounter  Procedures  . [facility] C-ARM 1-60 MIN NO REPORT    Intraoperative interpretation by procedural physician at [facility].    Standing Status:   Standing    Number of Occurrences:   1    Order Specific Question:   Reason for exam:    Answer:   Assistance in needle guidance and placement for procedures requiring needle placement in or near specific anatomical locations not easily accessible without such assistance.   PMP checked and reviewed.  Signed pain contract.  Tramadol 50 mg daily as needed, quantity  30/month.  Medications ordered for procedure: Meds ordered this encounter  Medications  . diazepam (VALIUM) tablet 5 mg  . iohexol (OMNIPAQUE) 180 MG/ML injection 10 mL  Must be Myelogram-compatible. If not available, you may substitute with a water-soluble, non-ionic, hypoallergenic, myelogram-compatible radiological contrast medium.  Marland Kitchen lidocaine (XYLOCAINE) 2 % (with pres) injection 400 mg  . ropivacaine (PF) 2 mg/mL (0.2%) (NAROPIN) injection 2 mL  . sodium chloride flush (NS) 0.9 % injection 2 mL  . dexamethasone (DECADRON) injection 10 mg  . traMADol (ULTRAM) 50 MG tablet    Sig: Take 1 tablet (50 mg total) by mouth daily as needed for severe pain. Month last 30 days.    Dispense:  30 tablet    Refill:  0    [facility] STOP ACT - Not applicable. Fill one day early if pharmacy is closed on scheduled refill date.   Medications administered: We administered iohexol, lidocaine, ropivacaine (PF) 2 mg/mL (0.2%), sodium chloride flush, and dexamethasone.  See the medical record for exact dosing, route, and time of administration.  Follow-up plan:   Return in about 4 weeks (around 07/09/2020) for Post Procedure Evaluation, in person.    Recent Visits Date Type Provider Dept  06/04/20 Office Visit Gillis Santa, MD [facility]  Showing recent visits within past 90 days and meeting all other requirements Today's Visits Date Type Provider Dept  06/11/20 Procedure visit Gillis Santa, MD [facility]  Showing today's visits and meeting all other requirements Future Appointments No visits were found meeting these conditions. Showing future appointments within next 90 days and meeting all other requirements  Disposition: Discharge home  Discharge (Date  Time): 06/11/2020;   hrs.   Primary Care Physician: Gae Bon, NP Location: [facility] Note by: Gillis Santa, MD Date: 06/11/2020; Time: 9:57 AM  Disclaimer:  Medicine is not an exact  science. The only guarantee in medicine is that nothing is guaranteed. It is important to note that the decision to proceed with this intervention was based on the information collected from the patient. The Data and conclusions were drawn from the patient's questionnaire, the interview, and the physical examination. Because the information was provided in large part by the patient, it cannot be guaranteed that it has not been purposely or unconsciously manipulated. Every effort has been made to obtain as much relevant data as possible for this evaluation. It is important to note that the conclusions that lead to this procedure are derived in large part from the available data. Always take into account that the treatment will also be dependent on availability of resources and existing treatment guidelines, considered by other Pain Management Practitioners as being common knowledge and practice, at the time of the intervention. For Medico-Legal purposes, it is also important to point out that variation in procedural techniques and pharmacological choices are the acceptable norm. The indications, contraindications, technique, and results of the above procedure should only be interpreted and judged by a Board-Certified Interventional Pain Specialist with extensive familiarity and expertise in the same exact procedure and technique.

## 2020-06-11 NOTE — Patient Instructions (Signed)
____________________________________________________________________________________________  Post-Procedure Discharge Instructions  Instructions:  Apply ice:   Purpose: This will minimize any swelling and discomfort after procedure.   When: Day of procedure, as soon as you get home.  How: Fill a plastic sandwich bag with crushed ice. Cover it with a small towel and apply to injection site.  How long: (15 min on, 15 min off) Apply for 15 minutes then remove x 15 minutes.  Repeat sequence on day of procedure, until you go to bed.  Apply heat:   Purpose: To treat any soreness and discomfort from the procedure.  When: Starting the next day after the procedure.  How: Apply heat to procedure site starting the day following the procedure.  How long: May continue to repeat daily, until discomfort goes away.  Food intake: Start with clear liquids (like water) and advance to regular food, as tolerated.   Physical activities: Keep activities to a minimum for the first 8 hours after the procedure. After that, then as tolerated.  Driving: If you have received any sedation, be responsible and do not drive. You are not allowed to drive for 24 hours after having sedation.  Blood thinner: (Applies only to those taking blood thinners) You may restart your blood thinner 6 hours after your procedure.  Insulin: (Applies only to Diabetic patients taking insulin) As soon as you can eat, you may resume your normal dosing schedule.  Infection prevention: Keep procedure site clean and dry. Shower daily and clean area with soap and water.  Post-procedure Pain Diary: Extremely important that this be done correctly and accurately. Recorded information will be used to determine the next step in treatment. For the purpose of accuracy, follow these rules:  Evaluate only the area treated. Do not report or include pain from an untreated area. For the purpose of this evaluation, ignore all other areas of pain,  except for the treated area.  After your procedure, avoid taking a long nap and attempting to complete the pain diary after you wake up. Instead, set your alarm clock to go off every hour, on the hour, for the initial 8 hours after the procedure. Document the duration of the numbing medicine, and the relief you are getting from it.  Do not go to sleep and attempt to complete it later. It will not be accurate. If you received sedation, it is likely that you were given a medication that may cause amnesia. Because of this, completing the diary at a later time may cause the information to be inaccurate. This information is needed to plan your care.  Follow-up appointment: Keep your post-procedure follow-up evaluation appointment after the procedure (usually 2 weeks for most procedures, 6 weeks for radiofrequencies). DO NOT FORGET to bring you pain diary with you.   Expect: (What should I expect to see with my procedure?)  From numbing medicine (AKA: Local Anesthetics): Numbness or decrease in pain. You may also experience some weakness, which if present, could last for the duration of the local anesthetic.  Onset: Full effect within 15 minutes of injected.  Duration: It will depend on the type of local anesthetic used. On the average, 1 to 8 hours.   From steroids (Applies only if steroids were used): Decrease in swelling or inflammation. Once inflammation is improved, relief of the pain will follow.  Onset of benefits: Depends on the amount of swelling present. The more swelling, the longer it will take for the benefits to be seen. In some cases, up to 10 days.    Duration: Steroids will stay in the system x 2 weeks. Duration of benefits will depend on multiple posibilities including persistent irritating factors.  Side-effects: If present, they may typically last 2 weeks (the duration of the steroids).  Frequent: Cramps (if they occur, drink Gatorade and take over-the-counter Magnesium 450-500 mg  once to twice a day); water retention with temporary weight gain; increases in blood sugar; decreased immune system response; increased appetite.  Occasional: Facial flushing (red, warm cheeks); mood swings; menstrual changes.  Uncommon: Long-term decrease or suppression of natural hormones; bone thinning. (These are more common with higher doses or more frequent use. This is why we prefer that our patients avoid having any injection therapies in other practices.)   Very Rare: Severe mood changes; psychosis; aseptic necrosis.  From procedure: Some discomfort is to be expected once the numbing medicine wears off. This should be minimal if ice and heat are applied as instructed.  Call if: (When should I call?)  You experience numbness and weakness that gets worse with time, as opposed to wearing off.  New onset bowel or bladder incontinence. (Applies only to procedures done in the spine)  Emergency Numbers:  Durning business hours (Monday - Thursday, 8:00 AM - 4:00 PM) (Friday, 9:00 AM - 12:00 Noon): [phone]  After hours: [phone]  NOTE: If you are having a problem and are unable connect with, or to talk to a provider, then go to your nearest urgent care or emergency department. If the problem is serious and urgent, please call 911. ____________________________________________________________________________________________   Epidural Steroid Injection  An epidural steroid injection is a shot of steroid medicine and numbing medicine that is given into the space between the spinal cord and the bones of the back (epidural space). The shot helps relieve pain caused by an irritated or swollen nerve root. The amount of pain relief you get from the injection depends on what is causing the nerve to be swollen and irritated, and how long your pain lasts. You are more likely to benefit from this injection if your pain is strong and comes on suddenly rather than if you have had  long-term (chronic) pain. Tell a health care provider about:  Any allergies you have.  All medicines you are taking, including vitamins, herbs, eye drops, creams, and over-the-counter medicines.  Any problems you or family members have had with anesthetic medicines.  Any blood disorders you have.  Any surgeries you have had.  Any medical conditions you have.  Whether you are pregnant or may be pregnant. What are the risks? Generally, this is a safe procedure. However, problems may occur, including:  Headache.  Bleeding.  Infection.  Allergic reaction to medicines.  Nerve damage. What happens before the procedure? Staying hydrated Follow instructions from your health care provider about hydration, which may include:  Up to 2 hours before the procedure - you may continue to drink clear liquids, such as water, clear fruit juice, black coffee, and plain tea. Eating and drinking restrictions Follow instructions from your health care provider about eating and drinking, which may include:  8 hours before the procedure - stop eating heavy meals or foods, such as meat, fried foods, or fatty foods.  6 hours before the procedure - stop eating light meals or foods, such as toast or cereal.  6 hours before the procedure - stop drinking milk or drinks that contain milk.  2 hours before the procedure - stop drinking clear liquids. Medicines  You may be given   medicines to lower anxiety.  Ask your health care provider about: ? Changing or stopping your regular medicines. This is especially important if you are taking diabetes medicines or blood thinners. ? Taking medicines such as aspirin and ibuprofen. These medicines can thin your blood. Do not take these medicines unless your health care provider tells you to take them. ? Taking over-the-counter medicines, vitamins, herbs, and supplements.  Ask your health care provider what steps will be taken to prevent infection. General  instructions  Plan to have someone take you home from the hospital or clinic.  If you will be going home right after the procedure, plan to have someone with you for 24 hours. What happens during the procedure?  An IV will be inserted into one of your veins.  You will be given one or more of the following: ? A medicine to help you relax (sedative). ? A medicine to numb the area (local anesthetic).  You will be asked to lie on your abdomen or sit.  The injection site will be cleaned.  A needle will be inserted through your skin into the epidural space. This may cause you some discomfort. An X-ray machine will be used to guide the needle as close as possible to the affected nerve.  A steroid medicine and a local anesthetic will be injected into the epidural space.  The needle and IV will be removed.  A bandage (dressing) will be put over the injection site. The procedure may vary among health care providers and hospitals. What can I expect after the procedure? Follow these instructions at home: Injection site care  You may remove the bandage (dressing) after 24 hours.  Check your injection site every day for signs of infection. Check for: ? Redness, swelling, or pain. ? Fluid or blood. ? Warmth. ? Pus or a bad smell. Managing pain, stiffness, and swelling  For 24 hours after the procedure: ? Avoid using heat on the injection site. ? Do not take baths, swim, or use a hot tub until your health care provider approves. Ask your health care provider if you may take a shower. You may only be allowed to take sponge baths.  If directed, put ice on the injection site. To do this: ? Put ice in a plastic bag. ? Place a towel between your skin and the bag. ? Leave the ice on for 20 minutes, 2-3 times a day.  Activity  Do not drive for 24 hours if you were given a sedative during your procedure.  Return to your normal activities as told by your health care provider. Ask your  health care provider what activities are safe for you. General instructions  Your blood pressure, heart rate, breathing rate, and blood oxygen level will be monitored until you leave the hospital or clinic.  Your arm or leg may feel weak or numb for a few hours.  The injection site may feel sore.  Take over-the-counter and prescription medicines only as told by your health care provider.  Drink enough fluid to keep your urine pale yellow.  Keep all follow-up visits as told by your health care provider. This is important. Contact a health care provider if:  You have any of these signs of infection: ? Redness, swelling, or pain around your injection site. ? Fluid or blood coming from your injection site. ? Warmth coming from your injection site. ? Pus or a bad smell coming from your injection site. ? A fever.  You continue to   have pain and soreness around the injection site, even after taking over-the-counter pain medicine.  You have severe, sudden, or lasting nausea or vomiting. Get help right away if:  You have severe pain at the injection site that is not relieved by medicines.  You develop a severe headache or a stiff neck.  You become sensitive to light.  You have any new numbness or weakness in your legs or arms.  You lose control of your bladder or bowel movements.  You have trouble breathing. Summary  An epidural steroid injection is a shot of steroid medicine and numbing medicine that is given into the epidural space.  The shot helps relieve pain caused by an irritated or swollen nerve root.  You are more likely to benefit from this injection if your pain is strong and comes on suddenly rather than if you have had chronic pain. This information is not intended to replace advice given to you by your health care provider. Make sure you discuss any questions you have with your health care provider. Document Revised: 05/09/2019 Document Reviewed: 05/09/2019 Elsevier  Patient Education  2020 [employer]  

## 2020-06-11 NOTE — Progress Notes (Signed)
Pain contract signed and reviewed with patient. Copy also given.

## 2020-06-11 NOTE — Progress Notes (Signed)
Safety precautions to be maintained throughout the outpatient stay will include: orient to surroundings, keep bed in low position, maintain call bell within reach at all times, provide assistance with transfer out of bed and ambulation.  

## 2020-06-12 ENCOUNTER — Telehealth: Payer: Self-pay

## 2020-06-12 NOTE — Telephone Encounter (Signed)
Post procedure phone call.  Patient states she is doing ok after hurting some in the night, but feels better now.

## 2020-06-19 ENCOUNTER — Emergency Department: Admission: EM | Admit: 2020-06-19 | Discharge: 2020-06-19 | Discharge status: Against Medical Advice | Payer: 59

## 2020-06-19 ENCOUNTER — Ambulatory Visit (INDEPENDENT_AMBULATORY_CARE_PROVIDER_SITE_OTHER): Payer: 59 | Admitting: Podiatry

## 2020-06-19 ENCOUNTER — Other Ambulatory Visit: Payer: Self-pay

## 2020-06-19 ENCOUNTER — Other Ambulatory Visit: Payer: Self-pay | Admitting: Podiatry

## 2020-06-19 ENCOUNTER — Ambulatory Visit (INDEPENDENT_AMBULATORY_CARE_PROVIDER_SITE_OTHER): Payer: 59

## 2020-06-19 ENCOUNTER — Telehealth: Payer: Self-pay | Admitting: Podiatry

## 2020-06-19 DIAGNOSIS — S91331A Puncture wound without foreign body, right foot, initial encounter: Secondary | ICD-10-CM

## 2020-06-19 DIAGNOSIS — M79671 Pain in right foot: Secondary | ICD-10-CM | POA: Diagnosis not present

## 2020-06-19 DIAGNOSIS — L03115 Cellulitis of right lower limb: Secondary | ICD-10-CM

## 2020-06-19 MED ORDER — DOXYCYCLINE HYCLATE 100 MG PO TABS
100.0000 mg | ORAL_TABLET | Freq: Two times a day (BID) | ORAL | 0 refills | Status: DC
Start: 2020-06-19 — End: 2020-07-25

## 2020-06-19 NOTE — Addendum Note (Signed)
Addended by: Boneta Lucks on: 06/19/2020 03:55 PM   Modules accepted: Orders

## 2020-06-19 NOTE — Progress Notes (Signed)
Subjective:  Patient ID: Colleen [name], female    DOB: Feb 01, 1961,  MRN: [id number]  No chief complaint on file.   59 y.o. female presents with the above complaint.  Patient presents with a complaint complaint of painful submetatarsal 4 lesion with streaking redness up the middle of the leg.  Patient states that it came out of nowhere sometimes today.  Patient does not recall stepping on anything.  Patient states that she has history of getting bug bites secondary to redness.  However she does not recall any trauma to the area.  She states that she may have stepped on something versus had a bug bite that could have led to this redness.  She has been ambulating in regular sneakers.  She has not seen anyone else prior to seeing me.  She denies any other acute complaints.   Review of Systems: Negative except as noted in the HPI. Denies N/V/F/Ch.  Past Medical History:  Diagnosis Date  . Anxiety   . Asthma   . Breast cancer ([place]) 2004   left breast  . Chronic back pain   . COPD (chronic obstructive pulmonary disease) ([place])   . Depression   . Personal history of radiation therapy     Current Outpatient Medications:  .  NEOMYCIN-POLYMYXIN-HYDROCORTISONE (CORTISPORIN) 1 % SOLN OTIC solution, neomycin-polymyxin-hydrocort 3.5 mg/mL-10,000 unit/mL-1 % ear solution, Disp: , Rfl:  .  ALPRAZolam (XANAX) 0.25 MG tablet, alprazolam 0.25 mg tablet, Disp: , Rfl:  .  aspirin EC 81 MG tablet, Take 81 mg by mouth daily., Disp: , Rfl:  .  Baclofen 5 MG TABS, baclofen 5 mg tablet, Disp: , Rfl:  .  Bempedoic Acid (NEXLETOL) 180 MG TABS, Nexletol 180 mg tablet, Disp: , Rfl:  .  benzonatate (TESSALON) 200 MG capsule, benzonatate 200 mg capsule, Disp: , Rfl:  .  cefdinir (OMNICEF) 300 MG capsule, Take 300 mg by mouth 2 (two) times daily., Disp: , Rfl:  .  cetirizine (ZYRTEC) 10 MG tablet, Take by mouth., Disp: , Rfl:  .  cetirizine-pseudoephedrine (ZYRTEC-D) 5-120 MG tablet, Take 1 tablet by mouth 2 (two)  times daily as needed for allergies., Disp: , Rfl:  .  Cholecalciferol 25 MCG (1000 UT) tablet, Take by mouth., Disp: , Rfl:  .  ciprofloxacin (CIPRO) 500 MG tablet, Take 500 mg by mouth 2 (two) times daily., Disp: , Rfl:  .  clarithromycin (BIAXIN) 500 MG tablet, clarithromycin 500 mg tablet, Disp: , Rfl:  .  cyanocobalamin 1000 MCG tablet, Take by mouth., Disp: , Rfl:  .  cyclobenzaprine (FLEXERIL) 10 MG tablet, Take 10 mg by mouth 3 (three) times daily as needed for muscle spasms., Disp: , Rfl:  .  denosumab (PROLIA) 60 MG/ML SOSY injection, Inject 60 mg into the skin every 6 (six) months., Disp: , Rfl:  .  doxycycline (VIBRA-TABS) 100 MG tablet, doxycycline hyclate 100 mg tablet, Disp: , Rfl:  .  EPINEPHrine 0.3 mg/0.3 mL IJ SOAJ injection, epinephrine 0.3 mg/0.3 mL injection, auto-injector, Disp: , Rfl:  .  fluticasone (FLONASE) 50 MCG/ACT nasal spray, fluticasone propionate 50 mcg/actuation nasal spray,suspension, Disp: , Rfl:  .  Fluticasone-Umeclidin-Vilant (TRELEGY ELLIPTA) 100-62.5-25 MCG/INH AEPB, Inhale into the lungs once., Disp: , Rfl:  .  hydrOXYzine (VISTARIL) 25 MG capsule, Take 25 mg by mouth 2 (two) times daily., Disp: , Rfl:  .  ketorolac (TORADOL) 10 MG tablet, ketorolac 10 mg tablet, Disp: , Rfl:  .  montelukast (SINGULAIR) 10 MG tablet, montelukast 10 mg tablet, Disp: ,  Rfl:  .  prazosin (MINIPRESS) 1 MG capsule, prazosin 1 mg capsule, Disp: , Rfl:  .  predniSONE (STERAPRED UNI-PAK 21 TAB) 10 MG (21) TBPK tablet, Take by mouth., Disp: , Rfl:  .  pregabalin (LYRICA) 75 MG capsule, Take 75 mg by mouth 2 (two) times daily., Disp: , Rfl:  .  traMADol (ULTRAM) 50 MG tablet, Take 50 mg by mouth every 6 (six) hours as needed., Disp: , Rfl:  .  traMADol (ULTRAM) 50 MG tablet, Take 1 tablet (50 mg total) by mouth daily as needed for severe pain. Month last 30 days., Disp: 30 tablet, Rfl: 0 .  VENTOLIN HFA 108 (90 Base) MCG/ACT inhaler, Inhale 1-2 puffs into the lungs every 4 (four)  hours as needed for cough or wheezing., Disp: , Rfl:   Social History   Tobacco Use  Smoking Status Current Every Day Smoker  . Packs/day: 0.50  . Types: Cigarettes  Smokeless Tobacco Never Used    Allergies  Allergen Reactions  . Desvenlafaxine Anaphylaxis  . Morphine And Related Anaphylaxis  . Penicillins Anaphylaxis    Has patient had a PCN reaction causing immediate rash, facial/tongue/throat swelling, SOB or lightheadedness with hypotension: Yes Has patient had a PCN reaction causing severe rash involving mucus membranes or skin necrosis: No Has patient had a PCN reaction that required hospitalization: Unknown Has patient had a PCN reaction occurring within the last 10 years: No If all of the above answers are "NO", then may proceed with Cephalosporin use.   . Prazosin Other (See Comments)  . Tamoxifen Anaphylaxis  . Trazodone Anaphylaxis  . Clonazepam   . Codeine Swelling  . Hydroxyzine Itching  . Paroxetine Hcl Hives  . Sulfa Antibiotics Itching   Objective:  There were no vitals filed for this visit. There is no height or weight on file to calculate BMI. Constitutional Well developed. Well nourished.  Vascular Dorsalis pedis pulses palpable bilaterally. Posterior tibial pulses palpable bilaterally. Capillary refill normal to all digits.  No cyanosis or clubbing noted. Pedal hair growth normal.  Neurologic Normal speech. Oriented to person, place, and time. Epicritic sensation to light touch grossly present bilaterally.  Dermatologic Nails well groomed and normal in appearance. No open wounds. No skin lesions.  Orthopedic:  Pain on palpation of the [name] submetatarsal 4 lesion at the entry point.  No purulent drainage was noted.  No crepitus or abscess formation palpated.  Streaking redness noted up to the mid leg extending from the entry site to the mid leg.  No malodor present.  No remanent foreign body noted.   Radiographs: 3 views of skeletally mature adult  [name] foot: No foreign body noted.  No osteomyelitic changes noted.  No soft tissue emphysema noted.  No other bony abnormalities noted. Assessment:   1. [name] foot pain   2. Puncture wound of [name] foot, initial encounter   3. Cellulitis of [name] lower extremity    Plan:  Patient was evaluated and treated and all questions answered.  [name] submetatarsal 4 bug bite versus puncture wound with streaking cellulitis to the mid leg -I discussed with the patient the urgency and the importance of this streaking cellulitis and various treatment options were discussed.  Given that the patient has streaking cellulitis present I believe she will benefit from 48 to 72 hours of IV antibiotics to help resolve this.  I also believe patient will benefit from an MRI evaluation to look for deeper abscess even though clinically I was not able to  appreciate any abscess. -I discussed my findings in extensive detail with the patient I instructed her to go to the emergency room at [facility] for IV antibiotics and an MRI evaluation.  I do not believe patient will need any kind of surgical intervention given clinically I do not see any remanent foreign body or radiographically appreciate any foreign body.  However I discussed with the patient that MRI will help discern this.  Patient states understanding -Triple antibiotic and a Band-Aid was applied to the entry site. -I will see her back after the patient is discharged from the hospital.  No follow-ups on file.

## 2020-06-19 NOTE — Telephone Encounter (Signed)
Patient's husband called in to inquire about the medication that the patient wanted Dr.Patel to prescribe. He told me that he had called nurse Levada Dy and left a message. I informed Mr. Fiscus that the nurse was in with the patients and that she would return his call by the end of the day. Mr Butters said that he just wanted to know if the doctor had prescribed any medication and if so, what pharmacy that it was sent to. I inform Mr. Hettinger where the medication was sent .  He said that he and his wife had been in the ER for several hours and that they were concerned about COVID and that didn't want to be there if they couldn't be called back to a room. I then told Mr. Koons that it  would be in their best interest that Colleen [name] stay and get seen, and take the treatment offered.

## 2020-06-20 ENCOUNTER — Telehealth: Payer: Self-pay

## 2020-06-20 NOTE — Telephone Encounter (Signed)
Patient called and said she picked up her antibiotic last night and started first dose.  She wants to know if you want to see her back and if so, when does she need to come back in?  Please advise

## 2020-06-21 NOTE — Telephone Encounter (Signed)
I would like to see her back in 1 week.

## 2020-06-28 ENCOUNTER — Encounter: Payer: Self-pay | Admitting: Podiatry

## 2020-06-28 ENCOUNTER — Other Ambulatory Visit: Payer: Self-pay

## 2020-06-28 ENCOUNTER — Ambulatory Visit (INDEPENDENT_AMBULATORY_CARE_PROVIDER_SITE_OTHER): Payer: 59 | Admitting: Podiatry

## 2020-06-28 DIAGNOSIS — S91331A Puncture wound without foreign body, right foot, initial encounter: Secondary | ICD-10-CM | POA: Diagnosis not present

## 2020-06-28 DIAGNOSIS — L03115 Cellulitis of right lower limb: Secondary | ICD-10-CM | POA: Diagnosis not present

## 2020-06-28 DIAGNOSIS — M79671 Pain in right foot: Secondary | ICD-10-CM | POA: Diagnosis not present

## 2020-06-29 ENCOUNTER — Encounter: Payer: Self-pay | Admitting: Podiatry

## 2020-06-29 NOTE — Progress Notes (Signed)
Subjective:  Patient ID: Colleen [name], female    DOB: 1961-10-04,  MRN: [id number]  Chief Complaint  Patient presents with  . Foot Pain    "its alot better"    59 y.o. female presents with the above complaint.  Patient follows up with complaint of [name] submetatarsal 4 puncture wound with streaking redness.  She states the redness is completely gone with oral antibiotics.  She states that overall her pain has decreased considerably.  She states that there is a small lesion still present.  However overall she is doing a lot better.  She could not go to the emergency room as there was a long wait.  She denies any other acute complaints.   Review of Systems: Negative except as noted in the HPI. Denies N/V/F/Ch.  Past Medical History:  Diagnosis Date  . Anxiety   . Asthma   . Breast cancer ([place]) 2004   left breast  . Chronic back pain   . COPD (chronic obstructive pulmonary disease) ([place])   . Depression   . Personal history of radiation therapy     Current Outpatient Medications:  .  ALPRAZolam (XANAX) 0.25 MG tablet, alprazolam 0.25 mg tablet, Disp: , Rfl:  .  aspirin EC 81 MG tablet, Take 81 mg by mouth daily., Disp: , Rfl:  .  Baclofen 5 MG TABS, baclofen 5 mg tablet, Disp: , Rfl:  .  Bempedoic Acid (NEXLETOL) 180 MG TABS, Nexletol 180 mg tablet, Disp: , Rfl:  .  benzonatate (TESSALON) 200 MG capsule, benzonatate 200 mg capsule, Disp: , Rfl:  .  cefdinir (OMNICEF) 300 MG capsule, Take 300 mg by mouth 2 (two) times daily., Disp: , Rfl:  .  cetirizine (ZYRTEC) 10 MG tablet, Take by mouth., Disp: , Rfl:  .  cetirizine-pseudoephedrine (ZYRTEC-D) 5-120 MG tablet, Take 1 tablet by mouth 2 (two) times daily as needed for allergies., Disp: , Rfl:  .  Cholecalciferol 25 MCG (1000 UT) tablet, Take by mouth., Disp: , Rfl:  .  ciprofloxacin (CIPRO) 500 MG tablet, Take 500 mg by mouth 2 (two) times daily., Disp: , Rfl:  .  clarithromycin (BIAXIN) 500 MG tablet, clarithromycin 500 mg tablet,  Disp: , Rfl:  .  cyanocobalamin 1000 MCG tablet, Take by mouth., Disp: , Rfl:  .  cyclobenzaprine (FLEXERIL) 10 MG tablet, Take 10 mg by mouth 3 (three) times daily as needed for muscle spasms., Disp: , Rfl:  .  denosumab (PROLIA) 60 MG/ML SOSY injection, Inject 60 mg into the skin every 6 (six) months., Disp: , Rfl:  .  doxycycline (VIBRA-TABS) 100 MG tablet, doxycycline hyclate 100 mg tablet, Disp: , Rfl:  .  doxycycline (VIBRA-TABS) 100 MG tablet, Take 1 tablet (100 mg total) by mouth 2 (two) times daily., Disp: 28 tablet, Rfl: 0 .  EPINEPHrine 0.3 mg/0.3 mL IJ SOAJ injection, epinephrine 0.3 mg/0.3 mL injection, auto-injector, Disp: , Rfl:  .  fluticasone (FLONASE) 50 MCG/ACT nasal spray, fluticasone propionate 50 mcg/actuation nasal spray,suspension, Disp: , Rfl:  .  Fluticasone-Umeclidin-Vilant (TRELEGY ELLIPTA) 100-62.5-25 MCG/INH AEPB, Inhale into the lungs once., Disp: , Rfl:  .  hydrOXYzine (VISTARIL) 25 MG capsule, Take 25 mg by mouth 2 (two) times daily., Disp: , Rfl:  .  ketorolac (TORADOL) 10 MG tablet, ketorolac 10 mg tablet, Disp: , Rfl:  .  montelukast (SINGULAIR) 10 MG tablet, montelukast 10 mg tablet, Disp: , Rfl:  .  NEOMYCIN-POLYMYXIN-HYDROCORTISONE (CORTISPORIN) 1 % SOLN OTIC solution, neomycin-polymyxin-hydrocort 3.5 mg/mL-10,000 unit/mL-1 % ear solution,  Disp: , Rfl:  .  prazosin (MINIPRESS) 1 MG capsule, prazosin 1 mg capsule, Disp: , Rfl:  .  predniSONE (STERAPRED UNI-PAK 21 TAB) 10 MG (21) TBPK tablet, Take by mouth., Disp: , Rfl:  .  pregabalin (LYRICA) 75 MG capsule, Take 75 mg by mouth 2 (two) times daily., Disp: , Rfl:  .  traMADol (ULTRAM) 50 MG tablet, Take 50 mg by mouth every 6 (six) hours as needed., Disp: , Rfl:  .  traMADol (ULTRAM) 50 MG tablet, Take 1 tablet (50 mg total) by mouth daily as needed for severe pain. Month last 30 days., Disp: 30 tablet, Rfl: 0 .  VENTOLIN HFA 108 (90 Base) MCG/ACT inhaler, Inhale 1-2 puffs into the lungs every 4 (four) hours as  needed for cough or wheezing., Disp: , Rfl:   Social History   Tobacco Use  Smoking Status Current Every Day Smoker  . Packs/day: 0.50  . Types: Cigarettes  Smokeless Tobacco Never Used    Allergies  Allergen Reactions  . Desvenlafaxine Anaphylaxis  . Morphine And Related Anaphylaxis  . Penicillins Anaphylaxis    Has patient had a PCN reaction causing immediate rash, facial/tongue/throat swelling, SOB or lightheadedness with hypotension: Yes Has patient had a PCN reaction causing severe rash involving mucus membranes or skin necrosis: No Has patient had a PCN reaction that required hospitalization: Unknown Has patient had a PCN reaction occurring within the last 10 years: No If all of the above answers are "NO", then may proceed with Cephalosporin use.   . Prazosin Other (See Comments)  . Tamoxifen Anaphylaxis  . Trazodone Anaphylaxis  . Clonazepam   . Codeine Swelling  . Hydroxyzine Itching  . Paroxetine Hcl Hives  . Sulfa Antibiotics Itching   Objective:  There were no vitals filed for this visit. There is no height or weight on file to calculate BMI. Constitutional Well developed. Well nourished.  Vascular Dorsalis pedis pulses palpable bilaterally. Posterior tibial pulses palpable bilaterally. Capillary refill normal to all digits.  No cyanosis or clubbing noted. Pedal hair growth normal.  Neurologic Normal speech. Oriented to person, place, and time. Epicritic sensation to light touch grossly present bilaterally.  Dermatologic Nails well groomed and normal in appearance. No open wounds. No skin lesions.  Orthopedic:  Pain on palpation of the [name] submetatarsal 4 lesion at the entry point.  No purulent drainage was noted.  No crepitus or abscess formation palpated.  Streaking redness noted up to the mid leg extending from the entry site to the mid leg.  No malodor present.  No remanent foreign body noted.   Radiographs: 3 views of skeletally mature adult [name]  foot: No foreign body noted.  No osteomyelitic changes noted.  No soft tissue emphysema noted.  No other bony abnormalities noted. Assessment:   1. Puncture wound of [name] foot, initial encounter   2. Cellulitis of [name] lower extremity   3. [name] foot pain    Plan:  Patient was evaluated and treated and all questions answered.  [name] submetatarsal 4 bug bite versus puncture wound with streaking cellulitis to the mid leg -Patient failed to go to the emergency room as there was a long wait time.  Patient had taken oral antibiotics which seems to have helped resolve all of her streaking cellulitis and controlled infection.  At this point patient has a small puncture wound on submetatarsal 4 that does not appear to have increased depth.  I have instructed her to do local wound care with Betadine  wet-to-dry dressing.  I also gave her surgical shoe to offload the ulceration site.  Given that she is improving clinically I will hold off on sending her back to the emergency room.  However if her redness continues to come back patient will need to go back to the emergency room.  Patient agrees with the plan.  No follow-ups on file.

## 2020-07-09 ENCOUNTER — Encounter: Payer: Self-pay | Admitting: Student in an Organized Health Care Education/Training Program

## 2020-07-09 ENCOUNTER — Ambulatory Visit
Payer: 59 | Attending: Student in an Organized Health Care Education/Training Program | Admitting: Student in an Organized Health Care Education/Training Program

## 2020-07-09 ENCOUNTER — Other Ambulatory Visit: Payer: Self-pay

## 2020-07-09 VITALS — BP 138/104 | HR 96 | Temp 97.0°F | Resp 16 | Ht 60.0 in | Wt 135.0 lb

## 2020-07-09 DIAGNOSIS — G894 Chronic pain syndrome: Secondary | ICD-10-CM | POA: Diagnosis present

## 2020-07-09 DIAGNOSIS — M5416 Radiculopathy, lumbar region: Secondary | ICD-10-CM | POA: Diagnosis present

## 2020-07-09 DIAGNOSIS — M5126 Other intervertebral disc displacement, lumbar region: Secondary | ICD-10-CM

## 2020-07-09 DIAGNOSIS — M47812 Spondylosis without myelopathy or radiculopathy, cervical region: Secondary | ICD-10-CM

## 2020-07-09 DIAGNOSIS — M7918 Myalgia, other site: Secondary | ICD-10-CM | POA: Diagnosis present

## 2020-07-09 NOTE — Progress Notes (Signed)
Safety precautions to be maintained throughout the outpatient stay will include: orient to surroundings, keep bed in low position, maintain call bell within reach at all times, provide assistance with transfer out of bed and ambulation.  

## 2020-07-09 NOTE — Patient Instructions (Signed)
____________________________________________________________________________________________  General Risks and Possible Complications  Patient Responsibilities: It is important that you read this as it is part of your informed consent. It is our duty to inform you of the risks and possible complications associated with treatments offered to you. It is your responsibility as a patient to read this and to ask questions about anything that is not clear or that you believe was not covered in this document.  Patient's Rights: You have the right to refuse treatment. You also have the right to change your mind, even after initially having agreed to have the treatment done. However, under this last option, if you wait until the last second to change your mind, you may be charged for the materials used up to that point.  Introduction: Medicine is not an exact science. Everything in Medicine, including the lack of treatment(s), carries the potential for danger, harm, or loss (which is by definition: Risk). In Medicine, a complication is a secondary problem, condition, or disease that can aggravate an already existing one. All treatments carry the risk of possible complications. The fact that a side effects or complications occurs, does not imply that the treatment was conducted incorrectly. It must be clearly understood that these can happen even when everything is done following the highest safety standards.  No treatment: You can choose not to proceed with the proposed treatment alternative. The "PRO(s)" would include: avoiding the risk of complications associated with the therapy. The "CON(s)" would include: not getting any of the treatment benefits. These benefits fall under one of three categories: diagnostic; therapeutic; and/or palliative. Diagnostic benefits include: getting information which can ultimately lead to improvement of the disease or symptom(s). Therapeutic benefits are those associated with the  successful treatment of the disease. Finally, palliative benefits are those related to the decrease of the primary symptoms, without necessarily curing the condition (example: decreasing the pain from a flare-up of a chronic condition, such as incurable terminal cancer).  General Risks and Complications: These are associated to most interventional treatments. They can occur alone, or in combination. They fall under one of the following six (6) categories: no benefit or worsening of symptoms; bleeding; infection; nerve damage; allergic reactions; and/or death. 1. No benefits or worsening of symptoms: In Medicine there are no guarantees, only probabilities. No healthcare provider can ever guarantee that a medical treatment will work, they can only state the probability that it may. Furthermore, there is always the possibility that the condition may worsen, either directly, or indirectly, as a consequence of the treatment. 2. Bleeding: This is more common if the patient is taking a blood thinner, either prescription or over the counter (example: Goody Powders, Fish oil, Aspirin, Garlic, etc.), or if suffering a condition associated with impaired coagulation (example: Hemophilia, cirrhosis of the liver, low platelet counts, etc.). However, even if you do not have one on these, it can still happen. If you have any of these conditions, or take one of these drugs, make sure to notify your treating physician. 3. Infection: This is more common in patients with a compromised immune system, either due to disease (example: diabetes, cancer, human immunodeficiency virus [HIV], etc.), or due to medications or treatments (example: therapies used to treat cancer and rheumatological diseases). However, even if you do not have one on these, it can still happen. If you have any of these conditions, or take one of these drugs, make sure to notify your treating physician. 4. Nerve Damage: This is more common when the   treatment is  an invasive one, but it can also happen with the use of medications, such as those used in the treatment of cancer. The damage can occur to small secondary nerves, or to large primary ones, such as those in the spinal cord and brain. This damage may be temporary or permanent and it may lead to impairments that can range from temporary numbness to permanent paralysis and/or brain death. 5. Allergic Reactions: Any time a substance or material comes in contact with our body, there is the possibility of an allergic reaction. These can range from a mild skin rash (contact dermatitis) to a severe systemic reaction (anaphylactic reaction), which can result in death. 6. Death: In general, any medical intervention can result in death, most of the time due to an unforeseen complication. ____________________________________________________________________________________________  Facet Blocks Patient Information  Description: The facets are joints in the spine between the vertebrae.  Like any joints in the body, facets can become irritated and painful.  Arthritis can also effect the facets.  By injecting steroids and local anesthetic in and around these joints, we can temporarily block the nerve supply to them.  Steroids act directly on irritated nerves and tissues to reduce selling and inflammation which often leads to decreased pain.  Facet blocks may be done anywhere along the spine from the neck to the low back depending upon the location of your pain.   After numbing the skin with local anesthetic (like Novocaine), a small needle is passed onto the facet joints under x-ray guidance.  You may experience a sensation of pressure while this is being done.  The entire block usually lasts about 15-25 minutes.   Conditions which may be treated by facet blocks:   Low back/buttock pain  Neck/shoulder pain  Certain types of headaches  Preparation for the injection:  1. Do not eat any solid food or dairy  products within 8 hours of your appointment. 2. You may drink clear liquid up to 3 hours before appointment.  Clear liquids include water, black coffee, juice or soda.  No milk or cream please. 3. You may take your regular medication, including pain medications, with a sip of water before your appointment.  Diabetics should hold regular insulin (if taken separately) and take 1/2 normal NPH dose the morning of the procedure.  Carry some sugar containing items with you to your appointment. 4. A driver must accompany you and be prepared to drive you home after your procedure. 5. Bring all your current medications with you. 6. An IV may be inserted and sedation may be given at the discretion of the physician. 7. A blood pressure cuff, EKG and other monitors will often be applied during the procedure.  Some patients may need to have extra oxygen administered for a short period. 8. You will be asked to provide medical information, including your allergies and medications, prior to the procedure.  We must know immediately if you are taking blood thinners (like Coumadin/Warfarin) or if you are allergic to IV iodine contrast (dye).  We must know if you could possible be pregnant.  Possible side-effects:   Bleeding from needle site  Infection (rare, may require surgery)  Nerve injury (rare)  Numbness & tingling (temporary)  Difficulty urinating (rare, temporary)  Spinal headache (a headache worse with upright posture)  Light-headedness (temporary)  Pain at injection site (serveral days)  Decreased blood pressure (rare, temporary)  Weakness in arm/leg (temporary)  Pressure sensation in back/neck (temporary)   Call if you  experience:   Fever/chills associated with headache or increased back/neck pain  Headache worsened by an upright position  New onset, weakness or numbness of an extremity below the injection site  Hives or difficulty breathing (go to the emergency  room)  Inflammation or drainage at the injection site(s)  Severe back/neck pain greater than usual  New symptoms which are concerning to you  Please note:  Although the local anesthetic injected can often make your back or neck feel good for several hours after the injection, the pain will likely return. It takes 3-7 days for steroids to work.  You may not notice any pain relief for at least one week.  If effective, we will often do a series of 2-3 injections spaced 3-6 weeks apart to maximally decrease your pain.  After the initial series, you may be a candidate for a more permanent nerve block of the facets.  If you have any questions, please call #[phone]) [facility]

## 2020-07-09 NOTE — Progress Notes (Signed)
PROVIDER NOTE: Information contained herein reflects review and annotations entered in association with encounter. Interpretation of such information and data should be left to medically-trained personnel. Information provided to patient can be located elsewhere in the medical record under "Patient Instructions". Document created using STT-dictation technology, any transcriptional errors that may result from process are unintentional.    Patient: Colleen [name]  Service Category: E/M  Provider: Gillis Santa, MD  DOB: [birth year]  DOS: 07/09/2020  Specialty: Interventional Pain Management  MRN: [id number]  Setting: Ambulatory outpatient  PCP: Gae Bon, NP  Type: Established Patient    Referring Provider: Gae Bon, NP  Location: Office  Delivery: Face-to-face     HPI  Reason for encounter: Colleen [name], a 59 y.o. year old female, is here today for evaluation and management of her Cervical facet joint syndrome [M47.812]. [name] primary complain today is Back Pain (lumbar), Neck Pain (midline), and Back Pain (thoracic between shoulder blades ) Last encounter: Practice (06/12/2020). My last encounter with her was on 06/11/2020. Pertinent problems: Colleen [name] has 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, L5/S1); Lumbar facet arthropathy (L4,5 S1); Cervical facet joint syndrome; Osteopenia of multiple sites; Cervical myofascial pain syndrome; and Lumbar radiculopathy on their pertinent problem list. Pain Assessment: Severity of Chronic pain is reported as a 5 /10. Location: Back (see visit info for additional pain sites.) Lower, Left, [name]/pain is much better in hips and legs. Onset: More than a month ago. Quality: Discomfort, Dull, Constant (tender at the lumbar region). Timing: Constant. Modifying factor(s): procedure, changing positions, using heat and ice. Vitals:  height is 5' (1.524 m) and weight is 135 lb (61.2 kg). Her temporal temperature is 97 F (36.1 C) (abnormal). Her blood pressure  is 138/104 (abnormal) and her pulse is 96. Her respiration is 16 and oxygen saturation is 93%.    Post-Procedure Evaluation  Procedure (06/11/2020): [name] L4-L5 ESI #1  Sedation: Please see nurses note.  Effectiveness during initial hour after procedure(Ultra-Short Term Relief): 100 %   Local anesthetic used: Long-acting (4-6 hours) Effectiveness: Defined as any analgesic benefit obtained secondary to the administration of local anesthetics. This carries significant diagnostic value as to the etiological location, or anatomical origin, of the pain. Duration of benefit is expected to coincide with the duration of the local anesthetic used.  Effectiveness during initial 4-6 hours after procedure(Short-Term Relief): 100 %   Long-term benefit: Defined as any relief past the pharmacologic duration of the local anesthetics.  Effectiveness past the initial 6 hours after procedure(Long-Term Relief): 50 % (numb when she left, increased pain that evening, about 3 days later that calmed down.)   Current benefits: Defined as benefit that persist at this time.   Analgesia:  50% improved Function: Somewhat improved ROM: Somewhat improved  ROS  Constitutional: Denies any fever or chills Gastrointestinal: No reported hemesis, hematochezia, vomiting, or acute GI distress Musculoskeletal: Neck pain, bilateral scapular pain, mid thoracic pain Neurological: No reported episodes of acute onset apraxia, aphasia, dysarthria, agnosia, amnesia, paralysis, loss of coordination, or loss of consciousness  Medication Review  ALPRAZolam, Baclofen, Bempedoic Acid, Cholecalciferol, EPINEPHrine, Fluticasone-Umeclidin-Vilant, NEOMYCIN-POLYMYXIN-HYDROCORTISONE, albuterol, aspirin EC, benzonatate, cefdinir, cetirizine, cetirizine-pseudoephedrine, ciprofloxacin, clarithromycin, cyanocobalamin, cyclobenzaprine, denosumab, doxycycline, fluticasone, hydrOXYzine, ketorolac, montelukast, prazosin, predniSONE, pregabalin, and  traMADol  History Review  Allergy: [name] is allergic to desvenlafaxine, morphine and related, penicillins, prazosin, tamoxifen, trazodone, clonazepam, codeine, hydroxyzine, paroxetine hcl, and sulfa antibiotics. Drug: Colleen [name]  reports no history of drug use. Alcohol:  reports previous alcohol use. Tobacco:  reports that she has been smoking cigarettes. She has been smoking about 0.50 packs per day. She has never used smokeless tobacco. Social: Colleen [name]  reports that she has been smoking cigarettes. She has been smoking about 0.50 packs per day. She has never used smokeless tobacco. She reports previous alcohol use. She reports that she does not use drugs. Medical:  has a past medical history of Anxiety, Asthma, Breast cancer ([place]) (2004), Chronic back pain, COPD (chronic obstructive pulmonary disease) ([place]), Depression, and Personal history of radiation therapy. Surgical: Colleen [name]  has a past surgical history that includes Nephrectomy (Left); Mastectomy (Left); Tubal ligation; Mastectomy; Breast biopsy (Left, 2004); Breast biopsy ([name]); and Breast lumpectomy (Left, 2004). Family: family history includes Breast cancer in her cousin and cousin.  Laboratory Chemistry Profile   Renal Lab Results  Component Value Date   BUN 13 03/21/2020   CREATININE 0.53 03/21/2020   GFRAA >60 03/21/2020   GFRNONAA >60 03/21/2020     Hepatic Lab Results  Component Value Date   AST 7 (L) 11/18/2019   ALT 14 11/18/2019   ALBUMIN 4.0 11/18/2019   ALKPHOS 50 11/18/2019     Electrolytes Lab Results  Component Value Date   NA 132 (L) 03/21/2020   K 4.3 03/21/2020   CL 98 03/21/2020   CALCIUM 8.9 03/21/2020     Bone No results found for: VD25OH, [id number], [id number], NT7001VC9, 25OHVITD1, 25OHVITD2, 25OHVITD3, TESTOFREE, TESTOSTERONE   Inflammation (CRP: Acute Phase) (ESR: Chronic Phase) Lab Results  Component Value Date   LATICACIDVEN 0.78 10/11/2018       Note: Above Lab results  reviewed.  Recent Imaging Review  DG Foot Complete [name] Please see detailed radiograph report in office note. Note: Reviewed        Physical Exam  General appearance: Well nourished, well developed, and well hydrated. In no apparent acute distress Mental status: Alert, oriented x 3 (person, place, & time)       Respiratory: No evidence of acute respiratory distress Eyes: PERLA Vitals: BP (!) 138/104 (BP Location: [name] Arm, Patient Position: Sitting, Cuff Size: Normal)   Pulse 96   Temp (!) 97 F (36.1 C) (Temporal)   Resp 16   Ht 5' (1.524 m)   Wt 135 lb (61.2 kg)   SpO2 93%   BMI 26.37 kg/m  BMI: Estimated body mass index is 26.37 kg/m as calculated from the following:   Height as of this encounter: 5' (1.524 m).   Weight as of this encounter: 135 lb (61.2 kg). Ideal: Ideal body weight: 45.5 kg (100 lb 4.9 oz) Adjusted ideal body weight: 51.8 kg (114 lb 3 oz)   Cervical Spine Area Exam  Skin & Axial Inspection: No masses, redness, edema, swelling, or associated skin lesions Alignment: Symmetrical Functional ROM: Pain restricted ROM, bilaterally Stability: No instability detected Muscle Tone/Strength: Functionally intact. No obvious neuro-muscular anomalies detected. Sensory (Neurological): Musculoskeletal pain pattern  Upper Extremity (UE) Exam    Side: [name] upper extremity  Side: Left upper extremity  Skin & Extremity Inspection: Skin color, temperature, and hair growth are WNL. No peripheral edema or cyanosis. No masses, redness, swelling, asymmetry, or associated skin lesions. No contractures.  Skin & Extremity Inspection: Skin color, temperature, and hair growth are WNL. No peripheral edema or cyanosis. No masses, redness, swelling, asymmetry, or associated skin lesions. No contractures.  Functional ROM: Decreased ROM for shoulder and elbow  Functional ROM: Decreased ROM for shoulder and elbow  Muscle Tone/Strength: Functionally intact. No  obvious neuro-muscular  anomalies detected.  Muscle Tone/Strength: Functionally intact. No obvious neuro-muscular anomalies detected.  Sensory (Neurological): Unimpaired          Sensory (Neurological): Unimpaired          Palpation: No palpable anomalies              Palpation: No palpable anomalies              Provocative Test(s):  Phalen's test: deferred Tinel's test: deferred Apley's scratch test (touch opposite shoulder):  Action 1 (Across chest): deferred Action 2 (Overhead): deferred Action 3 (LB reach): deferred   Provocative Test(s):  Phalen's test: deferred Tinel's test: deferred Apley's scratch test (touch opposite shoulder):  Action 1 (Across chest): deferred Action 2 (Overhead): deferred Action 3 (LB reach): deferred    Lumbar Spine Area Exam  Skin & Axial Inspection: No masses, redness, or swelling Alignment: Symmetrical Functional ROM: Improved after treatment       Stability: No instability detected Muscle Tone/Strength: Functionally intact. No obvious neuro-muscular anomalies detected. Sensory (Neurological): Improved  Ambulation: Limited Gait: Relatively normal for age and body habitus Posture: Difficulty standing up straight, due to pain  Lower Extremity Exam    Side: [name] lower extremity  Side: Left lower extremity  Stability: No instability observed          Stability: No instability observed          Skin & Extremity Inspection: Skin color, temperature, and hair growth are WNL. No peripheral edema or cyanosis. No masses, redness, swelling, asymmetry, or associated skin lesions. No contractures.  Skin & Extremity Inspection: Skin color, temperature, and hair growth are WNL. No peripheral edema or cyanosis. No masses, redness, swelling, asymmetry, or associated skin lesions. No contractures.  Functional ROM: Unrestricted ROM                  Functional ROM: Unrestricted ROM                  Muscle Tone/Strength: Functionally intact. No obvious neuro-muscular anomalies detected.   Muscle Tone/Strength: Functionally intact. No obvious neuro-muscular anomalies detected.  Sensory (Neurological): Unimpaired        Sensory (Neurological): Unimpaired        DTR: Patellar: deferred today Achilles: deferred today Plantar: deferred today  DTR: Patellar: deferred today Achilles: deferred today Plantar: deferred today  Palpation: No palpable anomalies  Palpation: No palpable anomalies    Assessment   Status Diagnosis  Persistent Persistent Persistent 1. Cervical facet joint syndrome (C3/4, C4/5)   2. Cervical myofascial pain syndrome   3. 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, L5/S1)   4. Lumbar radiculopathy   5. Chronic pain syndrome      Updated Problems: Problem  Cervical Myofascial Pain Syndrome  Lumbar Radiculopathy  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, L5/S1)  Lumbar facet arthropathy (L4,5 S1)  Cervical Facet Joint Syndrome  Osteopenia of Multiple Sites    Plan of Care   Cervical facet joint syndrome: Maytal Mijangos has a history of greater than 3 months of moderate to severe pain which is resulted in functional impairment.  The patient has tried various conservative therapeutic options such as NSAIDs, Tylenol, muscle relaxants, physical therapy which was inadequately effective.  Patient's pain is predominantly axial with physical exam findings suggestive of facet arthropathy. Cervical facet medial branch nerve blocks were discussed with the patient.  Risks and benefits were reviewed.  Patient would like to proceed with bilateral C4, C5, C6, medial branch nerve block.  Cervical myofascial pain syndrome: Cervical/thoracic TPI as below  Lumbar radicular pain, lumbar radiculopathy: Status post [name] L5-S1 ESI on 06/11/2020.  Therapeutic.  Repeat as needed.  Orders:  Orders Placed This Encounter  Procedures  . CERVICAL FACET (MEDIAL BRANCH NERVE BLOCK)     Standing Status:   Future    Standing Expiration Date:   08/09/2020    Scheduling Instructions:     Side:  Bilateral     Level: C3-4, C4-5, Facet joints (C3, C4, C5, Medial Branch Nerves)     Sedation: without     Timeframe: As soon as schedule allows    Order Specific Question:   Where will this procedure be performed?    Answer:   [facility]  . TRIGGER POINT INJECTION    Standing Status:   Future    Standing Expiration Date:   10/09/2020    Scheduling Instructions:     Cervical/thoracic TPI    Order Specific Question:   Where will this procedure be performed?    Answer:   [facility]  . Lumbar Epidural Injection    Standing Status:   Standing    Number of Occurrences:   9    Standing Expiration Date:   07/09/2021    Scheduling Instructions:     Purpose: Palliative ([name] L4/5)     Indication: Lower extremity pain/Sciatica unspecified side (M54.30).     Side: Midline     Level: TBD     Sedation: Patient's choice.     TIMEFRAME: PRN procedure. (Ms. Hauger will call when needed.)    Order Specific Question:   Where will this procedure be performed?    Answer:   [facility]   Follow-up plan:   Return in about 1 week (around 07/16/2020) for C3/4, C4/5 Fcts + TPI (40 mins), without sedation.    Status post [name] L4-L5 ESI #1 on 06/11/2020  Recent Visits Date Type Provider Dept  06/11/20 Procedure visit Gillis Santa, MD [facility]  06/04/20 Office Visit Gillis Santa, MD [facility]  Showing recent visits within past 90 days and meeting all other requirements Today's Visits Date Type Provider Dept  07/09/20 Office Visit Gillis Santa, MD [facility]  Showing today's visits and meeting all other requirements Future Appointments No visits were found meeting these conditions. Showing future appointments within next 90 days and meeting all other requirements  I discussed the assessment and treatment plan with the patient. The patient was provided an opportunity to ask questions and all were answered. The patient agreed with the plan  and demonstrated an understanding of the instructions.  Patient advised to call back or seek an in-person evaluation if the symptoms or condition worsens.  Duration of encounter: 87mnutes.  Note by: BGillis Santa MD Date: 07/09/2020; Time: 3:03 PM

## 2020-07-12 ENCOUNTER — Ambulatory Visit (INDEPENDENT_AMBULATORY_CARE_PROVIDER_SITE_OTHER): Payer: 59 | Admitting: Podiatry

## 2020-07-12 ENCOUNTER — Encounter: Payer: Self-pay | Admitting: Podiatry

## 2020-07-12 ENCOUNTER — Other Ambulatory Visit: Payer: Self-pay

## 2020-07-12 DIAGNOSIS — S91331A Puncture wound without foreign body, right foot, initial encounter: Secondary | ICD-10-CM | POA: Diagnosis not present

## 2020-07-12 DIAGNOSIS — L03115 Cellulitis of right lower limb: Secondary | ICD-10-CM | POA: Diagnosis not present

## 2020-07-17 ENCOUNTER — Encounter: Payer: Self-pay | Admitting: Podiatry

## 2020-07-17 NOTE — Progress Notes (Signed)
Subjective:  Patient ID: Colleen [name], female    DOB: [birth year],  MRN: [id number]  Chief Complaint  Patient presents with  . Wound Check    "its doing alot better"    59 y.o. female presents with the above complaint.  Patient presents with follow-up of [name] submetatarsal 4 puncture wound.  Patient is doing a lot better.  She does not have any more wound she has been doing her dressing changes.  She does not have any clinical signs of infection.   Review of Systems: Negative except as noted in the HPI. Denies N/V/F/Ch.  Past Medical History:  Diagnosis Date  . Anxiety   . Asthma   . Breast cancer ([place]) 2004   left breast  . Chronic back pain   . COPD (chronic obstructive pulmonary disease) ([place])   . Depression   . Personal history of radiation therapy     Current Outpatient Medications:  .  ALPRAZolam (XANAX) 0.25 MG tablet, alprazolam 0.25 mg tablet, Disp: , Rfl:  .  aspirin EC 81 MG tablet, Take 81 mg by mouth daily., Disp: , Rfl:  .  Baclofen 5 MG TABS, baclofen 5 mg tablet, Disp: , Rfl:  .  Bempedoic Acid (NEXLETOL) 180 MG TABS, Nexletol 180 mg tablet, Disp: , Rfl:  .  benzonatate (TESSALON) 200 MG capsule, benzonatate 200 mg capsule (Patient not taking: Reported on 07/09/2020), Disp: , Rfl:  .  cefdinir (OMNICEF) 300 MG capsule, Take 300 mg by mouth 2 (two) times daily., Disp: , Rfl:  .  cetirizine (ZYRTEC) 10 MG tablet, Take by mouth., Disp: , Rfl:  .  cetirizine-pseudoephedrine (ZYRTEC-D) 5-120 MG tablet, Take 1 tablet by mouth 2 (two) times daily as needed for allergies., Disp: , Rfl:  .  Cholecalciferol 25 MCG (1000 UT) tablet, Take by mouth., Disp: , Rfl:  .  ciprofloxacin (CIPRO) 500 MG tablet, Take 500 mg by mouth 2 (two) times daily. (Patient not taking: Reported on 07/09/2020), Disp: , Rfl:  .  clarithromycin (BIAXIN) 500 MG tablet, clarithromycin 500 mg tablet (Patient not taking: Reported on 07/09/2020), Disp: , Rfl:  .  cyanocobalamin 1000 MCG tablet, Take by  mouth., Disp: , Rfl:  .  cyclobenzaprine (FLEXERIL) 10 MG tablet, Take 10 mg by mouth 3 (three) times daily as needed for muscle spasms., Disp: , Rfl:  .  denosumab (PROLIA) 60 MG/ML SOSY injection, Inject 60 mg into the skin every 6 (six) months., Disp: , Rfl:  .  doxycycline (VIBRA-TABS) 100 MG tablet, doxycycline hyclate 100 mg tablet (Patient not taking: Reported on 07/09/2020), Disp: , Rfl:  .  doxycycline (VIBRA-TABS) 100 MG tablet, Take 1 tablet (100 mg total) by mouth 2 (two) times daily. (Patient not taking: Reported on 07/09/2020), Disp: 28 tablet, Rfl: 0 .  EPINEPHrine 0.3 mg/0.3 mL IJ SOAJ injection, epinephrine 0.3 mg/0.3 mL injection, auto-injector, Disp: , Rfl:  .  fluticasone (FLONASE) 50 MCG/ACT nasal spray, fluticasone propionate 50 mcg/actuation nasal spray,suspension (Patient not taking: Reported on 07/09/2020), Disp: , Rfl:  .  Fluticasone-Umeclidin-Vilant (TRELEGY ELLIPTA) 100-62.5-25 MCG/INH AEPB, Inhale into the lungs once., Disp: , Rfl:  .  hydrOXYzine (VISTARIL) 25 MG capsule, Take 25 mg by mouth 2 (two) times daily. (Patient not taking: Reported on 07/09/2020), Disp: , Rfl:  .  ketorolac (TORADOL) 10 MG tablet, ketorolac 10 mg tablet (Patient not taking: Reported on 07/09/2020), Disp: , Rfl:  .  montelukast (SINGULAIR) 10 MG tablet, montelukast 10 mg tablet, Disp: , Rfl:  .  NEOMYCIN-POLYMYXIN-HYDROCORTISONE (CORTISPORIN) 1 % SOLN OTIC solution, neomycin-polymyxin-hydrocort 3.5 mg/mL-10,000 unit/mL-1 % ear solution (Patient not taking: Reported on 07/09/2020), Disp: , Rfl:  .  prazosin (MINIPRESS) 1 MG capsule, prazosin 1 mg capsule (Patient not taking: Reported on 07/09/2020), Disp: , Rfl:  .  predniSONE (STERAPRED UNI-PAK 21 TAB) 10 MG (21) TBPK tablet, Take by mouth. (Patient not taking: Reported on 07/09/2020), Disp: , Rfl:  .  pregabalin (LYRICA) 75 MG capsule, Take 75 mg by mouth 2 (two) times daily., Disp: , Rfl:  .  traMADol (ULTRAM) 50 MG tablet, Take 50 mg by mouth every 6  (six) hours as needed. (Patient not taking: Reported on 07/09/2020), Disp: , Rfl:  .  VENTOLIN HFA 108 (90 Base) MCG/ACT inhaler, Inhale 1-2 puffs into the lungs every 4 (four) hours as needed for cough or wheezing., Disp: , Rfl:   Social History   Tobacco Use  Smoking Status Current Every Day Smoker  . Packs/day: 0.50  . Types: Cigarettes  Smokeless Tobacco Never Used    Allergies  Allergen Reactions  . Desvenlafaxine Anaphylaxis  . Morphine And Related Anaphylaxis  . Penicillins Anaphylaxis    Has patient had a PCN reaction causing immediate rash, facial/tongue/throat swelling, SOB or lightheadedness with hypotension: Yes Has patient had a PCN reaction causing severe rash involving mucus membranes or skin necrosis: No Has patient had a PCN reaction that required hospitalization: Unknown Has patient had a PCN reaction occurring within the last 10 years: No If all of the above answers are "NO", then may proceed with Cephalosporin use.   . Prazosin Other (See Comments)  . Tamoxifen Anaphylaxis  . Trazodone Anaphylaxis  . Clonazepam   . Codeine Swelling  . Hydroxyzine Itching  . Paroxetine Hcl Hives  . Sulfa Antibiotics Itching   Objective:  There were no vitals filed for this visit. There is no height or weight on file to calculate BMI. Constitutional Well developed. Well nourished.  Vascular Dorsalis pedis pulses palpable bilaterally. Posterior tibial pulses palpable bilaterally. Capillary refill normal to all digits.  No cyanosis or clubbing noted. Pedal hair growth normal.  Neurologic Normal speech. Oriented to person, place, and time. Epicritic sensation to light touch grossly present bilaterally.  Dermatologic Nails well groomed and normal in appearance. No open wounds. No skin lesions.  Orthopedic:  No pain on palpation of the [name] submetatarsal 4 lesion at the entry point.  No purulent drainage was noted.  No crepitus or abscess formation palpated.  No streaking  noted.  No malodor present.  No remanent foreign body noted.   Radiographs: 3 views of skeletally mature adult [name] foot: No foreign body noted.  No osteomyelitic changes noted.  No soft tissue emphysema noted.  No other bony abnormalities noted. Assessment:   1. Puncture wound of [name] foot, initial encounter   2. Cellulitis of [name] lower extremity    Plan:  Patient was evaluated and treated and all questions answered.  [name] submetatarsal 4 bug bite versus puncture wound with streaking cellulitis to the mid leg -Clinically healed.  She no longer has a puncture wound.  She can transition to regular shoes at this time. -I discussed with her that if the wound reulcerates or if any further foot and ankle issues arise in the future to come back and see me.  Patient states understanding.  No follow-ups on file.

## 2020-07-25 ENCOUNTER — Encounter: Payer: Self-pay | Admitting: Student in an Organized Health Care Education/Training Program

## 2020-07-25 ENCOUNTER — Other Ambulatory Visit: Payer: Self-pay

## 2020-07-25 ENCOUNTER — Ambulatory Visit
Admission: RE | Admit: 2020-07-25 | Discharge: 2020-07-25 | Disposition: A | Discharge status: Home / Self Care | Payer: 59 | Source: Ambulatory Visit | Attending: Student in an Organized Health Care Education/Training Program | Admitting: Student in an Organized Health Care Education/Training Program

## 2020-07-25 ENCOUNTER — Ambulatory Visit (HOSPITAL_BASED_OUTPATIENT_CLINIC_OR_DEPARTMENT_OTHER): Payer: 59 | Admitting: Student in an Organized Health Care Education/Training Program

## 2020-07-25 VITALS — BP 134/87 | Temp 97.5°F | Resp 20 | Ht 60.0 in | Wt 140.0 lb

## 2020-07-25 DIAGNOSIS — M47812 Spondylosis without myelopathy or radiculopathy, cervical region: Secondary | ICD-10-CM

## 2020-07-25 DIAGNOSIS — Z9012 Acquired absence of left breast and nipple: Secondary | ICD-10-CM | POA: Diagnosis not present

## 2020-07-25 DIAGNOSIS — M7918 Myalgia, other site: Secondary | ICD-10-CM

## 2020-07-25 DIAGNOSIS — G894 Chronic pain syndrome: Secondary | ICD-10-CM

## 2020-07-25 DIAGNOSIS — M542 Cervicalgia: Secondary | ICD-10-CM | POA: Diagnosis present

## 2020-07-25 DIAGNOSIS — M5382 Other specified dorsopathies, cervical region: Secondary | ICD-10-CM | POA: Diagnosis not present

## 2020-07-25 DIAGNOSIS — Z905 Acquired absence of kidney: Secondary | ICD-10-CM | POA: Insufficient documentation

## 2020-07-25 MED ORDER — ROPIVACAINE HCL 2 MG/ML IJ SOLN
9.0000 mL | Freq: Once | INTRAMUSCULAR | Status: AC
  Administered 2020-07-25: 10 mL via PERINEURAL

## 2020-07-25 MED ORDER — DEXAMETHASONE SODIUM PHOSPHATE 10 MG/ML IJ SOLN
10.0000 mg | Freq: Once | INTRAMUSCULAR | Status: AC
  Administered 2020-07-25: 10 mg

## 2020-07-25 MED ORDER — LIDOCAINE HCL 2 % IJ SOLN
INTRAMUSCULAR | Status: AC
  Filled 2020-07-25: qty 20

## 2020-07-25 MED ORDER — DEXAMETHASONE SODIUM PHOSPHATE 10 MG/ML IJ SOLN
INTRAMUSCULAR | Status: AC
  Filled 2020-07-25: qty 2

## 2020-07-25 MED ORDER — ROPIVACAINE HCL 2 MG/ML IJ SOLN
INTRAMUSCULAR | Status: AC
  Filled 2020-07-25: qty 20

## 2020-07-25 MED ORDER — LIDOCAINE HCL 2 % IJ SOLN
20.0000 mL | Freq: Once | INTRAMUSCULAR | Status: AC
  Administered 2020-07-25: 200 mg

## 2020-07-25 NOTE — Patient Instructions (Signed)

## 2020-07-25 NOTE — Progress Notes (Signed)
Safety precautions to be maintained throughout the outpatient stay will include: orient to surroundings, keep bed in low position, maintain call bell within reach at all times, provide assistance with transfer out of bed and ambulation.  

## 2020-07-25 NOTE — Progress Notes (Signed)
PROVIDER NOTE: Information contained herein reflects review and annotations entered in association with encounter. Interpretation of such information and data should be left to medically-trained personnel. Information provided to patient can be located elsewhere in the medical record under "Patient Instructions". Document created using STT-dictation technology, any transcriptional errors that may result from process are unintentional.    Patient: Colleen [name]  Service Category: Procedure  Provider: Gillis Santa, MD  DOB: 1961-04-22  DOS: 07/25/2020  Location: [facility]  MRN: [id number]  Setting: Ambulatory - outpatient  Referring Provider: Gillis Santa, MD  Type: Established Patient  Specialty: Interventional Pain Management  PCP: Gae Bon, NP   Primary Reason for Visit: Interventional Pain Management Treatment. CC: Neck Pain  Procedure:          Anesthesia, Analgesia, Anxiolysis:  Type: Cervical Facet Medial Branch Block(s)  #1  Primary Purpose: Diagnostic Region: Posterolateral cervical spine Level: C3, C4, C5, Medial Branch Level(s). Injecting these levels blocks the C3-4, C4-5,  cervical facet joints. Laterality: Bilateral  Type: Local Anesthesia  Local Anesthetic: Lidocaine 1-2%  Position: Prone with head of the table raised to facilitate breathing.   Indications: 1. Cervical facet joint syndrome (C3/4, C4/5)   2. Myofascial pain syndrome   3. Chronic pain syndrome    Pain Score: Pre-procedure: 10-Worst pain ever/10 Post-procedure: 0-No pain/10   Pre-op Assessment:  Colleen [name] is a 59 y.o. (year old), female patient, seen today for interventional treatment. She  has a past surgical history that includes Nephrectomy (Left); Mastectomy (Left); Tubal ligation; Mastectomy; Breast biopsy (Left, 2004); Breast biopsy ([name]); and Breast lumpectomy (Left, 2004). Colleen [name] has a current medication list which includes the following prescription(s): aspirin ec,  baclofen, cetirizine-pseudoephedrine, cholecalciferol, cyanocobalamin, denosumab, epinephrine, trelegy ellipta, montelukast, pregabalin, tramadol, ventolin hfa, cyclobenzaprine, ketorolac, and neomycin-polymyxin-hydrocortisone. Her primarily concern today is the Neck Pain  Initial Vital Signs:  Pulse/HCG Rate:  ECG Heart Rate: 90 (nsr) Temp: (!) 97.5 F (36.4 C) Resp: 18 BP: (!) 126/91 SpO2: 94 %  BMI: Estimated body mass index is 27.34 kg/m as calculated from the following:   Height as of this encounter: 5' (1.524 m).   Weight as of this encounter: 140 lb (63.5 kg).  Risk Assessment: Allergies: Reviewed. She is allergic to desvenlafaxine, morphine and related, penicillins, prazosin, tamoxifen, trazodone, clonazepam, codeine, hydroxyzine, paroxetine hcl, and sulfa antibiotics.  Allergy Precautions: None required Coagulopathies: Reviewed. None identified.  Blood-thinner therapy: None at this time Active Infection(s): Reviewed. None identified. Colleen [name] is afebrile  Site Confirmation: Colleen [name] was asked to confirm the procedure and laterality before marking the site Procedure checklist: Completed Consent: Before the procedure and under the influence of no sedative(s), amnesic(s), or anxiolytics, the patient was informed of the treatment options, risks and possible complications. To fulfill our ethical and legal obligations, as recommended by the American Medical Association's Code of Ethics, I have informed the patient of my clinical impression; the nature and purpose of the treatment or procedure; the risks, benefits, and possible complications of the intervention; the alternatives, including doing nothing; the risk(s) and benefit(s) of the alternative treatment(s) or procedure(s); and the risk(s) and benefit(s) of doing nothing. The patient was provided information about the general risks and possible complications associated with the procedure. These may include, but are not limited to:  failure to achieve desired goals, infection, bleeding, organ or nerve damage, allergic reactions, paralysis, and death. In addition, the patient was informed of those risks and complications associated to Spine-related procedures,  such as failure to decrease pain; infection (i.e.: Meningitis, epidural or intraspinal abscess); bleeding (i.e.: epidural hematoma, subarachnoid hemorrhage, or any other type of intraspinal or peri-dural bleeding); organ or nerve damage (i.e.: Any type of peripheral nerve, nerve root, or spinal cord injury) with subsequent damage to sensory, motor, and/or autonomic systems, resulting in permanent pain, numbness, and/or weakness of one or several areas of the body; allergic reactions; (i.e.: anaphylactic reaction); and/or death. Furthermore, the patient was informed of those risks and complications associated with the medications. These include, but are not limited to: allergic reactions (i.e.: anaphylactic or anaphylactoid reaction(s)); adrenal axis suppression; blood sugar elevation that in diabetics may result in ketoacidosis or comma; water retention that in patients with history of congestive heart failure may result in shortness of breath, pulmonary edema, and decompensation with resultant heart failure; weight gain; swelling or edema; medication-induced neural toxicity; particulate matter embolism and blood vessel occlusion with resultant organ, and/or nervous system infarction; and/or aseptic necrosis of one or more joints. Finally, the patient was informed that Medicine is not an exact science; therefore, there is also the possibility of unforeseen or unpredictable risks and/or possible complications that may result in a catastrophic outcome. The patient indicated having understood very clearly. We have given the patient no guarantees and we have made no promises. Enough time was given to the patient to ask questions, all of which were answered to the patient's satisfaction. Ms.  [name] has indicated that she wanted to continue with the procedure. Attestation: I, the ordering provider, attest that I have discussed with the patient the benefits, risks, side-effects, alternatives, likelihood of achieving goals, and potential problems during recovery for the procedure that I have provided informed consent. Date   Time: 07/25/2020  7:53 AM  Pre-Procedure Preparation:  Monitoring: As per clinic protocol. Respiration, ETCO2, SpO2, BP, heart rate and rhythm monitor placed and checked for adequate function Safety Precautions: Patient was assessed for positional comfort and pressure points before starting the procedure. Time-out: I initiated and conducted the "Time-out" before starting the procedure, as per protocol. The patient was asked to participate by confirming the accuracy of the "Time Out" information. Verification of the correct person, site, and procedure were performed and confirmed by me, the nursing staff, and the patient. "Time-out" conducted as per Joint Commission's Universal Protocol (UP.01.01.01). Time: 0830  Description of Procedure:          Laterality: Bilateral. The procedure was performed in identical fashion on both sides. Level: C3, C4, C5,Medial Branch Level(s). Area Prepped: Posterior Cervico-thoracic Region DuraPrep (Iodine Povacrylex [0.7% available iodine] and Isopropyl Alcohol, 74% w/w) Safety Precautions: Aspiration looking for blood return was conducted prior to all injections. At no point did we inject any substances, as a needle was being advanced. Before injecting, the patient was told to immediately notify me if she was experiencing any new onset of "ringing in the ears, or metallic taste in the mouth". No attempts were made at seeking any paresthesias. Safe injection practices and needle disposal techniques used. Medications properly checked for expiration dates. SDV (single dose vial) medications used. After the completion of the procedure, all  disposable equipment used was discarded in the proper designated medical waste containers. Local Anesthesia: Protocol guidelines were followed. The patient was positioned over the fluoroscopy table. The area was prepped in the usual manner. The time-out was completed. The target area was identified using fluoroscopy. A 12-in long, straight, sterile hemostat was used with fluoroscopic guidance to locate the targets for  each level blocked. Once located, the skin was marked with an approved surgical skin marker. Once all sites were marked, the skin (epidermis, dermis, and hypodermis), as well as deeper tissues (fat, connective tissue and muscle) were infiltrated with a small amount of a short-acting local anesthetic, loaded on a 10cc syringe with a 25G, 1.5-in  Needle. An appropriate amount of time was allowed for local anesthetics to take effect before proceeding to the next step. Local Anesthetic: Lidocaine 2.0% The unused portion of the local anesthetic was discarded in the proper designated containers. Technical explanation of process:  C3 Medial Branch Nerve Block (MBB): The target area for the C3 dorsal medial articular branch is the lateral concave waist of the articular pillar of C3. Under fluoroscopic guidance, a Quincke needle was inserted until contact was made with os over the postero-lateral aspect of the articular pillar of C3 (target area). After negative aspiration for blood, 1.5 mL of the nerve block solution was injected without difficulty or complication. The needle was removed intact. C4 Medial Branch Nerve Block (MBB): The target area for the C4 dorsal medial articular branch is the lateral concave waist of the articular pillar of C4. Under fluoroscopic guidance, a Quincke needle was inserted until contact was made with os over the postero-lateral aspect of the articular pillar of C4 (target area). After negative aspiration for blood, 1.76mL of the nerve block solution was injected without  difficulty or complication. The needle was removed intact. C5 Medial Branch Nerve Block (MBB): The target area for the C5 dorsal medial articular branch is the lateral concave waist of the articular pillar of C5. Under fluoroscopic guidance, a Quincke needle was inserted until contact was made with os over the postero-lateral aspect of the articular pillar of C5 (target area). After negative aspiration for blood, 1.74mL of the nerve block solution was injected without difficulty or complication. The needle was removed intact.  Procedural Needles: 22-gauge, 3.5-inch, Quincke needles used for all levels. Nerve block solution: 10 cc solution made 8 cc of 0.2% ropivacaine, 2 cc of Decadron 10 mg/cc.  1.5 cc injected at each level above bilaterally.  The unused portion of the solution was discarded in the proper designated containers.  Once the entire procedure was completed, the treated area was cleaned, making sure to leave some of the prepping solution back to take advantage of its long term bactericidal properties.  Anatomy Reference Guide:       Vitals:   07/25/20 0843 07/25/20 0848 07/25/20 0853 07/25/20 0855  BP: 128/85 137/85 (!) 136/94 134/87  Resp: 20 19 17 20   Temp:      TempSrc:      SpO2: 93% 96% 93% 93%  Weight:      Height:        Start Time: 0832 hrs. End Time: 0853 hrs.  Of note, trigger point injection was also done in the mid thoracic region.  Approximately 6 trigger point's were injected with 1 cc of 0.2% ropivacaine and dry needling performed at each trigger point.   Imaging Guidance (Spinal):          Type of Imaging Technique: Fluoroscopy Guidance (Spinal) Indication(s): Assistance in needle guidance and placement for procedures requiring needle placement in or near specific anatomical locations not easily accessible without such assistance. Exposure Time: Please see nurses notes. Contrast: None used. Fluoroscopic Guidance: I was personally present during the use of  fluoroscopy. "Tunnel Vision Technique" used to obtain the best possible view of the target area.  Parallax error corrected before commencing the procedure. "Direction-depth-direction" technique used to introduce the needle under continuous pulsed fluoroscopy. Once target was reached, antero-posterior, oblique, and lateral fluoroscopic projection used confirm needle placement in all planes. Images permanently stored in EMR. Interpretation: No contrast injected. I personally interpreted the imaging intraoperatively. Adequate needle placement confirmed in multiple planes. Permanent images saved into the patient's record.  Antibiotic Prophylaxis:   Anti-infectives (From admission, onward)   None     Indication(s): None identified  Post-operative Assessment:  Post-procedure Vital Signs:  Pulse/HCG Rate:  91 Temp: (!) 97.5 F (36.4 C) Resp: 20 BP: 134/87 SpO2: 93 %  EBL: None  Complications: No immediate post-treatment complications observed by team, or reported by patient.  Note: The patient tolerated the entire procedure well. A repeat set of vitals were taken after the procedure and the patient was kept under observation following institutional policy, for this type of procedure. Post-procedural neurological assessment was performed, showing return to baseline, prior to discharge. The patient was provided with post-procedure discharge instructions, including a section on how to identify potential problems. Should any problems arise concerning this procedure, the patient was given instructions to immediately contact us, at any time, without hesitation. In any case, we plan to contact the patient by telephone for a follow-up status report regarding this interventional procedure.  Comments:  No additional relevant information.  Plan of Care  Orders:  Orders Placed This Encounter  Procedures   DG PAIN [facility] C-ARM 1-60 MIN NO REPORT    Intraoperative interpretation by procedural physician  at [place].    Standing Status:   Standing    Number of Occurrences:   1    Order Specific Question:   Reason for exam:    Answer:   Assistance in needle guidance and placement for procedures requiring needle placement in or near specific anatomical locations not easily accessible without such assistance.    Medications ordered for procedure: Meds ordered this encounter  Medications   lidocaine (XYLOCAINE) 2 % (with pres) injection 400 mg   ropivacaine (PF) 2 mg/mL (0.2%) (NAROPIN) injection 9 mL   dexamethasone (DECADRON) injection 10 mg   ropivacaine (PF) 2 mg/mL (0.2%) (NAROPIN) injection 9 mL   dexamethasone (DECADRON) injection 10 mg   Medications administered: We administered lidocaine, ropivacaine (PF) 2 mg/mL (0.2%), dexamethasone, ropivacaine (PF) 2 mg/mL (0.2%), and dexamethasone.  See the medical record for exact dosing, route, and time of administration.  Follow-up plan:   Return in about 4 weeks (around 08/22/2020) for in person, Post Procedure Evaluation.      Status post [name] L4-L5 ESI #1 on 06/11/2020, bilateral C3, C4, C5 cervical facet medial branch nerve block 07/25/2020 with thoracic TPI   Recent Visits Date Type Provider Dept  07/09/20 Office Visit Gillis Santa, MD [facility]  06/11/20 Procedure visit Gillis Santa, MD [facility]  06/04/20 Office Visit Gillis Santa, MD [facility]  Showing recent visits within past 90 days and meeting all other requirements Today's Visits Date Type Provider Dept  07/25/20 Procedure visit Gillis Santa, MD [facility]  Showing today's visits and meeting all other requirements Future Appointments Date Type Provider Dept  08/20/20 Appointment Gillis Santa, MD [facility]  Showing future appointments within next 90 days and meeting all other requirements  Disposition: Discharge home  Discharge (Date   Time): 07/25/2020; 0900 hrs.   Primary Care Physician:  Gae Bon, NP Location: [facility] Note  by: Gillis Santa, MD Date: 07/25/2020; Time: 10:46 AM  Disclaimer:  Medicine is not an exact science. The only guarantee in medicine is that nothing is guaranteed. It is important to note that the decision to proceed with this intervention was based on the information collected from the patient. The Data and conclusions were drawn from the patient's questionnaire, the interview, and the physical examination. Because the information was provided in large part by the patient, it cannot be guaranteed that it has not been purposely or unconsciously manipulated. Every effort has been made to obtain as much relevant data as possible for this evaluation. It is important to note that the conclusions that lead to this procedure are derived in large part from the available data. Always take into account that the treatment will also be dependent on availability of resources and existing treatment guidelines, considered by other Pain Management Practitioners as being common knowledge and practice, at the time of the intervention. For Medico-Legal purposes, it is also important to point out that variation in procedural techniques and pharmacological choices are the acceptable norm. The indications, contraindications, technique, and results of the above procedure should only be interpreted and judged by a Board-Certified Interventional Pain Specialist with extensive familiarity and expertise in the same exact procedure and technique.

## 2020-08-16 ENCOUNTER — Encounter: Payer: Self-pay | Admitting: Student in an Organized Health Care Education/Training Program

## 2020-08-16 ENCOUNTER — Other Ambulatory Visit: Payer: Self-pay

## 2020-08-16 ENCOUNTER — Ambulatory Visit
Payer: 59 | Attending: Student in an Organized Health Care Education/Training Program | Admitting: Student in an Organized Health Care Education/Training Program

## 2020-08-16 VITALS — BP 138/93 | HR 103 | Temp 98.5°F | Resp 18 | Ht 60.0 in | Wt 140.0 lb

## 2020-08-16 DIAGNOSIS — M7918 Myalgia, other site: Secondary | ICD-10-CM

## 2020-08-16 DIAGNOSIS — M47812 Spondylosis without myelopathy or radiculopathy, cervical region: Secondary | ICD-10-CM

## 2020-08-16 DIAGNOSIS — G894 Chronic pain syndrome: Secondary | ICD-10-CM

## 2020-08-16 DIAGNOSIS — M5126 Other intervertebral disc displacement, lumbar region: Secondary | ICD-10-CM | POA: Diagnosis present

## 2020-08-16 DIAGNOSIS — M5416 Radiculopathy, lumbar region: Secondary | ICD-10-CM | POA: Diagnosis present

## 2020-08-16 MED ORDER — TRAMADOL HCL 50 MG PO TABS: 50.0000 mg | ORAL_TABLET | Freq: Two times a day (BID) | ORAL | 2 refills | Status: AC | PRN

## 2020-08-16 NOTE — Progress Notes (Signed)
Nursing Pain Medication Assessment:  Safety precautions to be maintained throughout the outpatient stay will include: orient to surroundings, keep bed in low position, maintain call bell within reach at all times, provide assistance with transfer out of bed and ambulation.  Medication Inspection Compliance: Pill count conducted under aseptic conditions, in front of the patient. Neither the pills nor the bottle was removed from the patient's sight at any time. Once count was completed pills were immediately returned to the patient in their original bottle.  Medication: Tramadol (Ultram) Pill/Patch Count: 25 of 50 pills remain Pill/Patch Appearance: Markings consistent with prescribed medication Bottle Appearance: Standard pharmacy container. Clearly labeled. Filled Date: 08 / 02 / 2021 Last Medication intake:  Day before yesterday

## 2020-08-16 NOTE — Progress Notes (Signed)
PROVIDER NOTE: Information contained herein reflects review and annotations entered in association with encounter. Interpretation of such information and data should be left to medically-trained personnel. Information provided to patient can be located elsewhere in the medical record under "Patient Instructions". Document created using STT-dictation technology, any transcriptional errors that may result from process are unintentional.    Patient: Colleen [name]  Service Category: E/M  Provider: Gillis Santa, MD  DOB: 03-04-1961  DOS: 08/16/2020  Specialty: Interventional Pain Management  MRN: [id number]  Setting: Ambulatory outpatient  PCP: Gae Bon, NP  Type: Established Patient    Referring Provider: Gae Bon, NP  Location: Office  Delivery: Face-to-face     HPI  [name], a 59 y.o. year old female, is here today because of her Cervical facet joint syndrome [M47.812]. [name] primary complain today is Neck Pain (in between shoulders) Last encounter: My last encounter with her was on 07/25/2020. Pertinent problems: [name] has 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, L5/S1); Lumbar facet arthropathy (L4,5 S1); Cervical facet joint syndrome; Osteopenia of multiple sites; Cervical myofascial pain syndrome; and Lumbar radiculopathy on their pertinent problem list. Pain Assessment: Severity of Chronic pain is reported as a 10-Worst pain ever/10. Location: Neck [name], Left/ . Onset: More than a month ago. Quality: Constant, Sharp, Stabbing, Aching. Timing: Constant. Modifying factor(s): medications. Vitals:  height is 5' (1.524 m) and weight is 140 lb (63.5 kg). Her oral temperature is 98.5 F (36.9 C). Her blood pressure is 138/93 (abnormal) and her pulse is 103 (abnormal). Her respiration is 18 and oxygen saturation is 91%.   Reason for encounter: both, medication management and post-procedure assessment.   No significant benefit from diagnostic cervical facet medial branch  nerve blocks.  Significant neck pain and occipital pain related to cervical spondylosis and cervical facet joint syndrome. Is not taking tramadol regularly.  Instructed patient regarding preemptive analgesia and to take tramadol every night and an extra tablet during the day if she is having pain flare.  Patient endorsed understanding. In regards to her lumbar radicular pain, still obtaining benefit after [name] L4-L5 ESI on 06/11/2020.  Repeat as needed.  Pharmacotherapy Assessment   Analgesic: Tramadol 50 mg daily as needed however does not utilize it every day.  Monitoring: [place] PMP: PDMP reviewed during this encounter.       Pharmacotherapy: No side-effects or adverse reactions reported. Compliance: No problems identified. Effectiveness: Clinically acceptable.  Hart Rochester, RN  08/16/2020  8:55 AM  Sign when Signing Visit Nursing Pain Medication Assessment:  Safety precautions to be maintained throughout the outpatient stay will include: orient to surroundings, keep bed in low position, maintain call bell within reach at all times, provide assistance with transfer out of bed and ambulation.  Medication Inspection Compliance: Pill count conducted under aseptic conditions, in front of the patient. Neither the pills nor the bottle was removed from the patient's sight at any time. Once count was completed pills were immediately returned to the patient in their original bottle.  Medication: Tramadol (Ultram) Pill/Patch Count: 25 of 50 pills remain Pill/Patch Appearance: Markings consistent with prescribed medication Bottle Appearance: Standard pharmacy container. Clearly labeled. Filled Date: 08 / 02 / 2021 Last Medication intake:  Day before yesterday    UDS:  Summary  Date Value Ref Range Status  06/05/2020 Note  Final    Comment:    ==================================================================== Compliance Drug Analysis,  Ur ==================================================================== Test  Result       Flag       Units  Drug Present and Declared for Prescription Verification   Ephedrine/Pseudoephedrine      PRESENT      EXPECTED   Phenylpropanolamine            PRESENT      EXPECTED    Source of ephedrine/pseudoephedrine is most commonly pseudoephedrine    in over-the-counter or prescription cold and allergy medications.    Phenylpropanolamine is an expected metabolite of    ephedrine/pseudoephedrine.    Pregabalin                     PRESENT      EXPECTED  Drug Present not Declared for Prescription Verification   Acetaminophen                  PRESENT      UNEXPECTED  Drug Absent but Declared for Prescription Verification   Tramadol                       Not Detected UNEXPECTED ng/mg creat   Cyclobenzaprine                Not Detected UNEXPECTED   Salicylate                     Not Detected UNEXPECTED    Aspirin, as indicated in the declared medication list, is not always    detected even when used as directed.  ==================================================================== Test                      Result    Flag   Units      Ref Range   Creatinine              22               mg/dL      >=20 ==================================================================== Declared Medications:  The flagging and interpretation on this report are based on the  following declared medications.  Unexpected results may arise from  inaccuracies in the declared medications.   **Note: The testing scope of this panel includes these medications:   Cyclobenzaprine (Flexeril)  Pregabalin (Lyrica)  Pseudoephedrine (Zyrtec-D)  Tramadol (Ultram)   **Note: The testing scope of this panel does not include small to  moderate amounts of these reported medications:   Aspirin   **Note: The testing scope of this panel does not include the  following reported medications:    Albuterol  Cetirizine (Zyrtec-D)  Denosumab (Prolia)  Fluticasone (Trelegy)  Umeclidinium (Trelegy)  Vilanterol (Trelegy) ==================================================================== For clinical consultation, please call [phone]. ====================================================================      ROS  Constitutional: Denies any fever or chills Gastrointestinal: No reported hemesis, hematochezia, vomiting, or acute GI distress Musculoskeletal: Bilateral neck pain Neurological: No reported episodes of acute onset apraxia, aphasia, dysarthria, agnosia, amnesia, paralysis, loss of coordination, or loss of consciousness  Medication Review  Baclofen, Cholecalciferol, EPINEPHrine, Fluticasone-Umeclidin-Vilant, albuterol, aspirin EC, cetirizine-pseudoephedrine, denosumab, montelukast, pregabalin, and traMADol  History Review  Allergy: Ms. Babler is allergic to desvenlafaxine, morphine and related, penicillins, prazosin, tamoxifen, trazodone, clonazepam, codeine, hydroxyzine, paroxetine hcl, and sulfa antibiotics. Drug: Ms. Castagnola  reports no history of drug use. Alcohol:  reports previous alcohol use. Tobacco:  reports that she has been smoking cigarettes. She has been smoking about 0.50 packs per day. She has never used smokeless tobacco. Social:  Ms. Virtue  reports that she has been smoking cigarettes. She has been smoking about 0.50 packs per day. She has never used smokeless tobacco. She reports previous alcohol use. She reports that she does not use drugs. Medical:  has a past medical history of Anxiety, Asthma, Breast cancer ([place]) (2004), Chronic back pain, COPD (chronic obstructive pulmonary disease) ([place]), Depression, and Personal history of radiation therapy. Surgical: Ms. Leavey  has a past surgical history that includes Nephrectomy (Left); Mastectomy (Left); Tubal ligation; Mastectomy; Breast biopsy (Left, 2004); Breast biopsy ([name]); and Breast lumpectomy (Left,  2004). Family: family history includes Breast cancer in her cousin and cousin.  Laboratory Chemistry Profile   Renal Lab Results  Component Value Date   BUN 13 03/21/2020   CREATININE 0.53 03/21/2020   GFRAA >60 03/21/2020   GFRNONAA >60 03/21/2020     Hepatic Lab Results  Component Value Date   AST 7 (L) 11/18/2019   ALT 14 11/18/2019   ALBUMIN 4.0 11/18/2019   ALKPHOS 50 11/18/2019     Electrolytes Lab Results  Component Value Date   NA 132 (L) 03/21/2020   K 4.3 03/21/2020   CL 98 03/21/2020   CALCIUM 8.9 03/21/2020     Bone No results found for: VD25OH, [id number], [id number], [id number], 25OHVITD1, 25OHVITD2, 25OHVITD3, TESTOFREE, TESTOSTERONE   Inflammation (CRP: Acute Phase) (ESR: Chronic Phase) Lab Results  Component Value Date   LATICACIDVEN 0.78 10/11/2018       Note: Above Lab results reviewed.  Recent Imaging Review  DG PAIN CLINIC C-ARM 1-60 MIN NO REPORT Fluoro was used, but no Radiologist interpretation will be provided.  Please refer to "NOTES" tab for provider progress note. Note: Reviewed        Physical Exam  General appearance: Well nourished, well developed, and well hydrated. In no apparent acute distress Mental status: Alert, oriented x 3 (person, place, & time)       Respiratory: No evidence of acute respiratory distress Eyes: PERLA Vitals: BP (!) 138/93   Pulse (!) 103   Temp 98.5 F (36.9 C) (Oral)   Resp 18   Ht 5' (1.524 m)   Wt 140 lb (63.5 kg)   SpO2 91%   BMI 27.34 kg/m  BMI: Estimated body mass index is 27.34 kg/m as calculated from the following:   Height as of this encounter: 5' (1.524 m).   Weight as of this encounter: 140 lb (63.5 kg). Ideal: Ideal body weight: 45.5 kg (100 lb 4.9 oz) Adjusted ideal body weight: 52.7 kg (116 lb 3 oz)  Cervical Spine Area Exam  Skin & Axial Inspection: No masses, redness, edema, swelling, or associated skin lesions Alignment: Symmetrical Functional ROM: Pain restricted ROM,  bilaterally Stability: No instability detected Muscle Tone/Strength: Functionally intact. No obvious neuro-muscular anomalies detected. Sensory (Neurological): Musculoskeletal pain pattern  Upper Extremity (UE) Exam    Side: [name] upper extremity  Side: Left upper extremity  Skin & Extremity Inspection: Skin color, temperature, and hair growth are WNL. No peripheral edema or cyanosis. No masses, redness, swelling, asymmetry, or associated skin lesions. No contractures.  Skin & Extremity Inspection: Skin color, temperature, and hair growth are WNL. No peripheral edema or cyanosis. No masses, redness, swelling, asymmetry, or associated skin lesions. No contractures.  Functional ROM: Decreased ROM for shoulder and elbow  Functional ROM: Decreased ROM for shoulder and elbow  Muscle Tone/Strength: Functionally intact. No obvious neuro-muscular anomalies detected.  Muscle Tone/Strength: Functionally intact. No obvious neuro-muscular anomalies detected.  Sensory (Neurological):  Unimpaired          Sensory (Neurological): Unimpaired          Palpation: No palpable anomalies              Palpation: No palpable anomalies              Provocative Test(s):  Phalen's test: deferred Tinel's test: deferred Apley's scratch test (touch opposite shoulder):  Action 1 (Across chest): deferred Action 2 (Overhead): deferred Action 3 (LB reach): deferred   Provocative Test(s):  Phalen's test: deferred Tinel's test: deferred Apley's scratch test (touch opposite shoulder):  Action 1 (Across chest): deferred Action 2 (Overhead): deferred Action 3 (LB reach): deferred    Lumbar Spine Area Exam  Skin & Axial Inspection: No masses, redness, or swelling Alignment: Symmetrical Functional ROM: Improved after treatment       Stability: No instability detected Muscle Tone/Strength: Functionally intact. No obvious neuro-muscular anomalies detected. Sensory (Neurological): Improved  Ambulation:  Limited Gait: Relatively normal for age and body habitus Posture: Difficulty standing up straight, due to pain  Lower Extremity Exam    Side: [name] lower extremity  Side: Left lower extremity  Stability: No instability observed          Stability: No instability observed          Skin & Extremity Inspection: Skin color, temperature, and hair growth are WNL. No peripheral edema or cyanosis. No masses, redness, swelling, asymmetry, or associated skin lesions. No contractures.  Skin & Extremity Inspection: Skin color, temperature, and hair growth are WNL. No peripheral edema or cyanosis. No masses, redness, swelling, asymmetry, or associated skin lesions. No contractures.  Functional ROM: Unrestricted ROM                  Functional ROM: Unrestricted ROM                  Muscle Tone/Strength: Functionally intact. No obvious neuro-muscular anomalies detected.  Muscle Tone/Strength: Functionally intact. No obvious neuro-muscular anomalies detected.  Sensory (Neurological): Unimpaired        Sensory (Neurological): Unimpaired        DTR: Patellar: deferred today Achilles: deferred today Plantar: deferred today  DTR: Patellar: deferred today Achilles: deferred today Plantar: deferred today  Palpation: No palpable anomalies  Palpation: No palpable anomalies     Assessment   Status Diagnosis  Persistent Persistent Persistent 1. Cervical facet joint syndrome (C3/4, C4/5)   2. Myofascial pain syndrome   3. Cervical myofascial pain syndrome   4. 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, L5/S1)   5. Lumbar radiculopathy   6. Chronic pain syndrome       Plan of Care  Ms. Kerry-Anne Mezo has a current medication list which includes the following long-term medication(s): montelukast, pregabalin, and ventolin hfa.  Pharmacotherapy (Medications Ordered): Meds ordered this encounter  Medications  . traMADol (ULTRAM) 50 MG tablet    Sig: Take 1 tablet (50 mg total) by mouth 2 (two) times daily  as needed.    Dispense:  45 tablet    Refill:  2   Orders:  Orders Placed This Encounter  Procedures  . Lumbar Epidural Injection    Standing Status:   Standing    Number of Occurrences:   9    Standing Expiration Date:   08/16/2021    Scheduling Instructions:     Purpose: Palliative     Indication: Lower extremity pain/Sciatica unspecified side (M54.30).     Side: Midline  Level: TBD     Sedation: Patient's choice.     TIMEFRAME: PRN procedure. (Ms. Peckenpaugh will call when needed.)    Order Specific Question:   Where will this procedure be performed?    Answer:   [facility]   Follow-up plan:   Return in about 3 months (around 11/16/2020) for Medication Management, in person.     Status post [name] L4-L5 ESI #1 on 06/11/2020, bilateral C3, C4, C5 cervical facet medial branch nerve block 07/25/2020 with thoracic TPI: only helped for 24 hrs    Recent Visits Date Type Provider Dept  07/25/20 Procedure visit Gillis Santa, MD [facility]  07/09/20 Office Visit Gillis Santa, MD [facility]  06/11/20 Procedure visit Gillis Santa, MD [facility]  06/04/20 Office Visit Gillis Santa, MD [facility]  Showing recent visits within past 90 days and meeting all other requirements Today's Visits Date Type Provider Dept  08/16/20 Office Visit Gillis Santa, MD [facility]  Showing today's visits and meeting all other requirements Future Appointments Date Type Provider Dept  11/01/20 Appointment Gillis Santa, MD [facility]  Showing future appointments within next 90 days and meeting all other requirements  I discussed the assessment and treatment plan with the patient. The patient was provided an opportunity to ask questions and all were answered. The patient agreed with the plan and demonstrated an understanding of the instructions.  Patient advised to call back or seek an in-person evaluation if the symptoms or condition  worsens.  Duration of encounter: 30 minutes.  Note by: Gillis Santa, MD Date: 08/16/2020; Time: 12:53 PM

## 2020-08-20 ENCOUNTER — Encounter: Payer: 59 | Admitting: Student in an Organized Health Care Education/Training Program

## 2020-10-16 ENCOUNTER — Emergency Department: Payer: 59

## 2020-10-16 ENCOUNTER — Other Ambulatory Visit: Payer: Self-pay

## 2020-10-16 ENCOUNTER — Emergency Department
Admission: EM | Admit: 2020-10-16 | Discharge: 2020-10-16 | Disposition: A | Discharge status: Home / Self Care | Payer: 59 | Attending: Emergency Medicine | Admitting: Emergency Medicine

## 2020-10-16 ENCOUNTER — Encounter: Payer: Self-pay | Admitting: Radiology

## 2020-10-16 DIAGNOSIS — F17210 Nicotine dependence, cigarettes, uncomplicated: Secondary | ICD-10-CM | POA: Insufficient documentation

## 2020-10-16 DIAGNOSIS — Z853 Personal history of malignant neoplasm of breast: Secondary | ICD-10-CM | POA: Diagnosis not present

## 2020-10-16 DIAGNOSIS — Z7982 Long term (current) use of aspirin: Secondary | ICD-10-CM | POA: Diagnosis not present

## 2020-10-16 DIAGNOSIS — Z20822 Contact with and (suspected) exposure to covid-19: Secondary | ICD-10-CM | POA: Insufficient documentation

## 2020-10-16 DIAGNOSIS — Z7951 Long term (current) use of inhaled steroids: Secondary | ICD-10-CM | POA: Diagnosis not present

## 2020-10-16 DIAGNOSIS — R0602 Shortness of breath: Secondary | ICD-10-CM | POA: Diagnosis present

## 2020-10-16 DIAGNOSIS — J441 Chronic obstructive pulmonary disease with (acute) exacerbation: Secondary | ICD-10-CM

## 2020-10-16 DIAGNOSIS — R0600 Dyspnea, unspecified: Secondary | ICD-10-CM

## 2020-10-16 LAB — CBC WITH DIFFERENTIAL/PLATELET
Abs Immature Granulocytes: 0.02 10*3/uL (ref 0.00–0.07)
Basophils Absolute: 0.1 10*3/uL (ref 0.0–0.1)
Basophils Relative: 1 %
Eosinophils Absolute: 0.1 10*3/uL (ref 0.0–0.5)
Eosinophils Relative: 2 %
HCT: 41.6 % (ref 36.0–46.0)
Hemoglobin: 13.5 g/dL (ref 12.0–15.0)
Immature Granulocytes: 0 %
Lymphocytes Relative: 26 %
Lymphs Abs: 1.7 10*3/uL (ref 0.7–4.0)
MCH: 30.1 pg (ref 26.0–34.0)
MCHC: 32.5 g/dL (ref 30.0–36.0)
MCV: 92.9 fL (ref 80.0–100.0)
Monocytes Absolute: 0.5 10*3/uL (ref 0.1–1.0)
Monocytes Relative: 7 %
Neutro Abs: 4.2 10*3/uL (ref 1.7–7.7)
Neutrophils Relative %: 64 %
Platelets: 240 10*3/uL (ref 150–400)
RBC: 4.48 MIL/uL (ref 3.87–5.11)
RDW: 13.2 % (ref 11.5–15.5)
WBC: 6.6 10*3/uL (ref 4.0–10.5)
nRBC: 0 % (ref 0.0–0.2)

## 2020-10-16 LAB — RESP PANEL BY RT-PCR (FLU A&B, COVID) ARPGX2
Influenza A by PCR: NEGATIVE
Influenza B by PCR: NEGATIVE
SARS Coronavirus 2 by RT PCR: NEGATIVE

## 2020-10-16 LAB — COMPREHENSIVE METABOLIC PANEL
ALT: 11 U/L (ref 0–44)
AST: 14 U/L — ABNORMAL LOW (ref 15–41)
Albumin: 3.8 g/dL (ref 3.5–5.0)
Alkaline Phosphatase: 37 U/L — ABNORMAL LOW (ref 38–126)
Anion gap: 7 (ref 5–15)
BUN: 9 mg/dL (ref 6–20)
CO2: 27 mmol/L (ref 22–32)
Calcium: 8.5 mg/dL — ABNORMAL LOW (ref 8.9–10.3)
Chloride: 105 mmol/L (ref 98–111)
Creatinine, Ser: 0.54 mg/dL (ref 0.44–1.00)
GFR, Estimated: >60 mL/min (ref >60)
Glucose, Bld: 108 mg/dL — ABNORMAL HIGH (ref 70–99)
Potassium: 4.2 mmol/L (ref 3.5–5.1)
Sodium: 139 mmol/L (ref 135–145)
Total Bilirubin: 0.6 mg/dL (ref 0.3–1.2)
Total Protein: 6.7 g/dL (ref 6.5–8.1)

## 2020-10-16 LAB — BRAIN NATRIURETIC PEPTIDE: B Natriuretic Peptide: 26.6 pg/mL (ref 0.0–100.0)

## 2020-10-16 LAB — LIPASE, BLOOD: Lipase: 24 U/L (ref 11–51)

## 2020-10-16 IMAGING — DX DG CHEST 1V PORT
1 series · 1 of 1 positions shown · non-contrast
Comparison: [DATE].

CLINICAL DATA: Shortness of breath.

EXAM:
PORTABLE CHEST 1 VIEW

[chest ap]
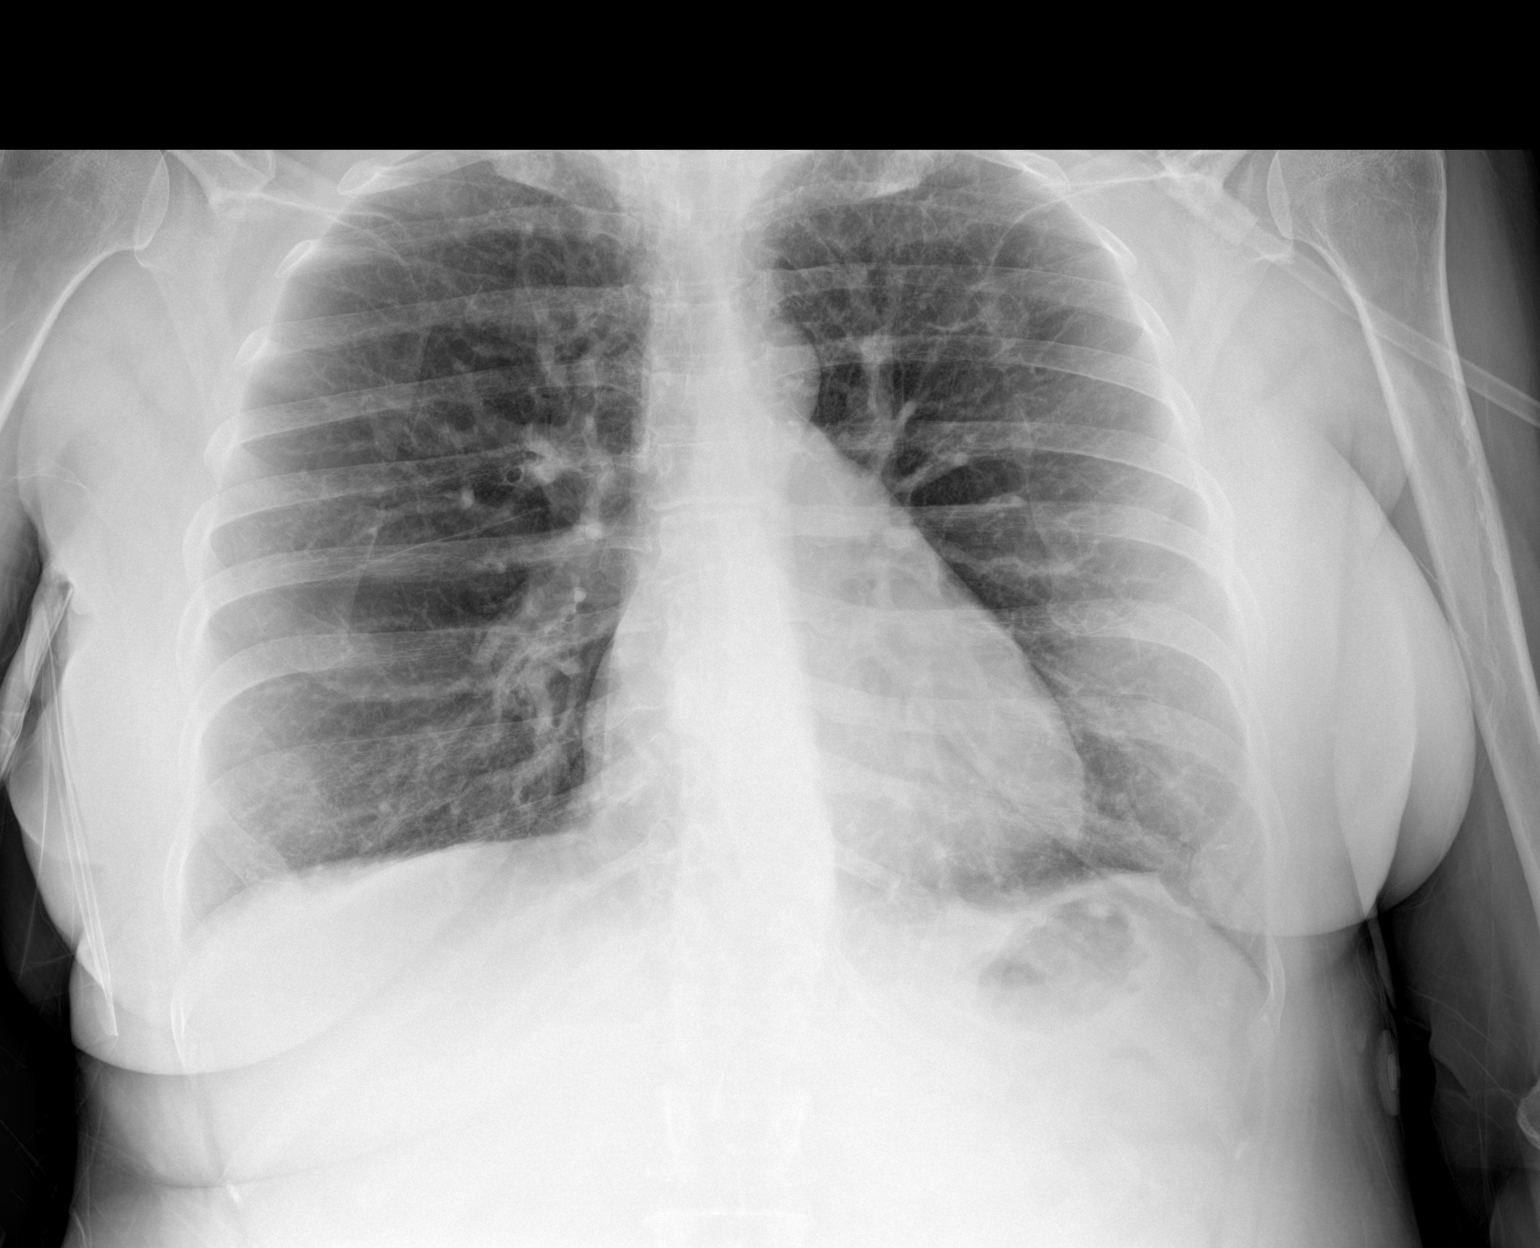

[1 of 1 positions shown; findings below may reference images not displayed]

FINDINGS: Mediastinum and hilar structures normal. Heart size normal. Mild
left mid lung and right base atelectasis/infiltrates cannot be
excluded. No pleural effusion or pneumothorax.
IMPRESSION: Mild left mid lung and right base atelectasis/infiltrates cannot be
excluded.

## 2020-10-16 MED ORDER — IPRATROPIUM-ALBUTEROL 0.5-2.5 (3) MG/3ML IN SOLN
3.0000 mL | Freq: Once | RESPIRATORY_TRACT | Status: AC
  Administered 2020-10-16: 3 mL via RESPIRATORY_TRACT
  Filled 2020-10-16: qty 3

## 2020-10-16 MED ORDER — DIPHENHYDRAMINE HCL 50 MG/ML IJ SOLN
INTRAMUSCULAR | Status: AC
  Administered 2020-10-16: 25 mg via INTRAVENOUS
  Filled 2020-10-16: qty 1

## 2020-10-16 MED ORDER — PREDNISONE 20 MG PO TABS
60.0000 mg | ORAL_TABLET | ORAL | Status: AC
  Administered 2020-10-16: 60 mg via ORAL
  Filled 2020-10-16: qty 3

## 2020-10-16 MED ORDER — AZITHROMYCIN 250 MG PO TABS: ORAL_TABLET | ORAL | 0 refills | Status: DC

## 2020-10-16 MED ORDER — KETOROLAC TROMETHAMINE 30 MG/ML IJ SOLN
15.0000 mg | Freq: Once | INTRAMUSCULAR | Status: AC
  Administered 2020-10-16: 15 mg via INTRAVENOUS
  Filled 2020-10-16: qty 1

## 2020-10-16 MED ORDER — DIPHENHYDRAMINE HCL 50 MG/ML IJ SOLN: 25.0000 mg | Freq: Once | INTRAMUSCULAR | Status: AC

## 2020-10-16 MED ORDER — PREDNISONE 20 MG PO TABS: 60.0000 mg | ORAL_TABLET | Freq: Every day | ORAL | 0 refills | Status: DC

## 2020-10-16 NOTE — ED Triage Notes (Signed)
Pt in with co shob that started tonight. States took chronic pain med and started itching. States no hx fo the same and shob has become worse since.

## 2020-10-16 NOTE — ED Notes (Signed)
Pt verbalized understanding of d/c instructions and denies further questions at this time. Pt ambulated to lobby without assistance, steady gait noted, NAD noted at this time

## 2020-10-16 NOTE — ED Provider Notes (Signed)
[facility] Emergency Department Provider Note  ____________________________________________   First MD Initiated Contact with Patient 10/16/20 0515     (approximate)  I have reviewed the triage vital signs and the nursing notes.   HISTORY  Chief Complaint Shortness of Breath    HPI Colleen [name] is a 59 y.o. female with medical history as listed below which notably includes COPD with the use of 2 L of oxygen at night due to "central and slightly obstructive sleep apnea".  She presents tonight for gradually worsening shortness of breath over the last 24 hours or so.  She was feeling short of breath when she went to sleep and was on her usual oxygen and when she woke up about 4 AM she felt severely short of breath.  She has had no pain at any point.  She took her pain medicine prescribed by Dr. Zollie Scale with the pain management clinic before going to sleep and wondered if she was perhaps having a reaction to it even though she has had those medications in the past (tramadol and another medication).  She has not been feeling ill recently but states that when she felt similar in the past she had pneumonia.  She denies fever/chills, sore throat, nausea, vomiting, chest pain, abdominal pain, and dysuria.  She is not vaccinated for COVID-19 but she said that she never leaves the house and her husband does the grocery shopping.  He has not been ill recently.  She describes the shortness of breath as severe and made worse with exertion.         Past Medical History:  Diagnosis Date  . Anxiety   . Asthma   . Breast cancer ([place]) 2004   left breast  . Chronic back pain   . COPD (chronic obstructive pulmonary disease) ([place])   . Depression   . Personal history of radiation therapy     Patient Active Problem List   Diagnosis Date Noted  . Cervical myofascial pain syndrome 07/09/2020  . Lumbar radiculopathy 07/09/2020  . Chronic pain syndrome 06/04/2020  . Lumbar  disc herniation (L4/5, L5/S1) 06/04/2020  . Lumbar facet arthropathy (L4,5 S1) 06/04/2020  . Cervical facet joint syndrome 06/04/2020  . Osteopenia of multiple sites 06/04/2020  . Age related osteoporosis 06/04/2020  . Low back pain 04/27/2020  . COPD exacerbation ([place]) 03/20/2020  . Asthma, chronic, unspecified asthma severity, with acute exacerbation 03/20/2020  . Panic attack 03/20/2020  . Acute on chronic respiratory failure with hypoxia ([place])   . Sepsis ([place]) 10/11/2018  . CAP (community acquired pneumonia) 10/11/2018  . COPD (chronic obstructive pulmonary disease) ([place]) 10/11/2018  . GAD (generalized anxiety disorder) 10/11/2018    Past Surgical History:  Procedure Laterality Date  . BREAST BIOPSY Left 2004   positive  . BREAST BIOPSY Right    neg  . BREAST LUMPECTOMY Left 2004   positive  . MASTECTOMY Left   . MASTECTOMY    . NEPHRECTOMY Left   . TUBAL LIGATION      Prior to Admission medications   Medication Sig Start Date End Date Taking? Authorizing Provider  aspirin EC 81 MG tablet Take 81 mg by mouth daily.    [provider]  Baclofen 5 MG TABS baclofen 5 mg tablet    [provider]  cetirizine-pseudoephedrine (ZYRTEC-D) 5-120 MG tablet Take 1 tablet by mouth 2 (two) times daily as needed for allergies.    [provider]  Cholecalciferol 25 MCG (1000 UT)  tablet Take by mouth.    [provider]  denosumab (PROLIA) 60 MG/ML SOSY injection Inject 60 mg into the skin every 6 (six) months.    [provider]  EPINEPHrine 0.3 mg/0.3 mL IJ SOAJ injection epinephrine 0.3 mg/0.3 mL injection, auto-injector    [provider]  Fluticasone-Umeclidin-Vilant (TRELEGY ELLIPTA) 100-62.5-25 MCG/INH AEPB Inhale into the lungs once.    [provider]  montelukast (SINGULAIR) 10 MG tablet montelukast 10 mg tablet    [provider]  pregabalin (LYRICA) 75 MG capsule Take 75 mg by mouth 2 (two) times daily.     [provider]  VENTOLIN HFA 108 (90 Base) MCG/ACT inhaler Inhale 1-2 puffs into the lungs every 4 (four) hours as needed for cough or wheezing. 10/02/18   [provider]    Allergies Desvenlafaxine, Morphine and related, Penicillins, Prazosin, Tamoxifen, Trazodone, Clonazepam, Codeine, Hydroxyzine, Paroxetine hcl, and Sulfa antibiotics  Family History  Problem Relation Age of Onset  . Breast cancer Cousin   . Breast cancer Cousin     Social History Social History   Tobacco Use  . Smoking status: Current Every Day Smoker    Packs/day: 0.50    Types: Cigarettes  . Smokeless tobacco: Never Used  Substance Use Topics  . Alcohol use: Not Currently  . Drug use: Never    Review of Systems Constitutional: No fever/chills Eyes: No visual changes. ENT: No sore throat. Cardiovascular: Denies chest pain. Respiratory: +shortness of breath. Gastrointestinal: No abdominal pain.  No nausea, no vomiting.  No diarrhea.  No constipation. Genitourinary: Negative for dysuria. Musculoskeletal: Negative for neck pain.  Negative for back pain. Integumentary: Negative for rash. Neurological: Negative for headaches, focal weakness or numbness.   ____________________________________________   PHYSICAL EXAM:  VITAL SIGNS: ED Triage Vitals  Enc Vitals Group     BP 10/16/20 0448 (!) 125/91     Pulse Rate 10/16/20 0448 95     Resp 10/16/20 0448 (!) 26     Temp 10/16/20 0448 98 F (36.7 C)     Temp Source 10/16/20 0448 Oral     SpO2 10/16/20 0448 (!) 87 %     Weight 10/16/20 0457 63.5 kg (140 lb)     Height 10/16/20 0457 1.803 m (5\' 11" )     Head Circumference --      Peak Flow --      Pain Score 10/16/20 0457 10     Pain Loc --      Pain Edu? --      Excl. in [place]? --     Constitutional: Alert and oriented.  Eyes: Conjunctivae are normal.  Head: Atraumatic. Nose: No congestion/rhinnorhea. Mouth/Throat: Patient is wearing a mask. Neck: No stridor.  No  meningeal signs.   Cardiovascular: Normal rate, regular rhythm. Good peripheral circulation. Grossly normal heart sounds. Respiratory: Normal respiratory effort.  She has some tachypnea but is not using accessory muscles.  She has decreased air movement and her lung sounds are tight but without wheezing. Gastrointestinal: Soft and nontender. No distention.  Musculoskeletal: No lower extremity tenderness nor edema. No gross deformities of extremities. Neurologic:  Normal speech and language. No gross focal neurologic deficits are appreciated.  Skin:  Skin is warm, dry and intact. Psychiatric: Mood and affect are anxious but generally appropriate under the circumstances.  ____________________________________________   LABS (all labs ordered are listed, but only abnormal results are displayed)  Labs Reviewed  COMPREHENSIVE METABOLIC PANEL - Abnormal; Notable for the following  components:      Result Value   Glucose, Bld 108 (*)    Calcium 8.5 (*)    AST 14 (*)    Alkaline Phosphatase 37 (*)    All other components within normal limits  RESP PANEL BY RT-PCR (FLU A&B, COVID) ARPGX2  CBC WITH DIFFERENTIAL/PLATELET  LIPASE, BLOOD  BRAIN NATRIURETIC PEPTIDE   ____________________________________________  EKG  ED ECG REPORT I, Hinda Kehr, the attending physician, personally viewed and interpreted this ECG.  Date: 10/16/2020 EKG Time: 4:55 AM Rate: 89 Rhythm: normal sinus rhythm QRS Axis: normal Intervals: normal ST/T Wave abnormalities: normal Narrative Interpretation: no evidence of acute ischemia ____________________________________________  RADIOLOGY I, Hinda Kehr, personally viewed and evaluated these images (plain radiographs) as part of my medical decision making, as well as reviewing the written report by the radiologist.  ED MD interpretation: Mid left lung and right lung base atelectasis versus infiltrate.  Official radiology report(s): DG Chest Portable 1  View  Result Date: 10/16/2020 CLINICAL DATA:  Shortness of breath. EXAM: PORTABLE CHEST 1 VIEW COMPARISON:  03/20/2020. FINDINGS: Mediastinum and hilar structures normal. Heart size normal. Mild left mid lung and right base atelectasis/infiltrates cannot be excluded. No pleural effusion or pneumothorax. IMPRESSION: Mild left mid lung and right base atelectasis/infiltrates cannot be excluded. Electronically Signed   By: Marcello Moores  Register   On: 10/16/2020 05:49    ____________________________________________   PROCEDURES   Procedure(s) performed (including Critical Care):  .1-3 Lead EKG Interpretation Performed by: Hinda Kehr, MD Authorized by: Hinda Kehr, MD     Interpretation: normal     ECG rate:  85   ECG rate assessment: normal     Rhythm: sinus rhythm     Ectopy: none     Conduction: normal       ____________________________________________   INITIAL IMPRESSION / MDM / ASSESSMENT AND PLAN / ED COURSE  As part of my medical decision making, I reviewed the following data within the [place] notes reviewed and incorporated, Labs reviewed , EKG interpreted , Old chart reviewed, Radiograph reviewed  and reviewed Notes from prior ED visits   Differential diagnosis includes, but is not limited to, COPD exacerbation, new onset CHF, PE, pneumonia, COVID-19, less likely atypical ACS presentation.  The patient is on the cardiac monitor to evaluate for evidence of arrhythmia and/or significant heart rate changes.  Most likely the patient is experiencing a COPD exacerbation.  Her breath sounds are tight with no wheezing which could suggest significant obstruction.  She was hypoxemic initially although she is on 2 L of oxygen but only at night.  Her oxygen saturation was 87% so she is on her 2 L of oxygen and is now satting in the upper 90s.  She is tachypneic but not really using accessory muscles at this time.  She is unvaccinated for COVID-19 and a  COVID-19 swab is pending.  Vital signs are otherwise unremarkable.  Lab work is pending and I am going to treat empirically with DuoNeb's x3 as well as a dose of prednisone 60 mg by mouth.  We will reassess after the work-up is complete and determine whether or not she is feeling better, maintaining her oxygenation, etc., or whether she will require admission.  The patient understands agrees with the plan.  I have very low suspicion for allergic reaction given that she has taken the medications before and there is no indication of anaphylaxis.     Clinical Course as of Dec 07  [place] Oct 16, 2020  0559 Patient's lab work is reassuring with an essentially normal comprehensive metabolic panel, CBC, and lipase.  BNP and respiratory viral panel including COVID-19 are pending.   [CF]  3845 I personally reviewed the patient's imaging and agree with the radiologist's interpretation that there is either some atelectasis or small areas of infiltrates.  Thus far the patient's work-up does not suggest an infectious etiology unless this is an atypical presentation such as COVID-19.  Respiratory viral panel is still pending.  I will hold off on empiric treatment with azithromycin, for example, until knowing the results of the PCR test.  DG Chest Portable 1 View [CF]  0700 The patient's lab work has been reassuring with no acute abnormalities identified on CMP, CBC, lipase, BNP.  Her respiratory viral panel including COVID-19 and influenza are also negative.  I reassessed the patient and she is feeling very jittery after DuoNeb's but she feels like she is breathing a little bit better.  Given she had an episode of hypoxemia to 87%, I talked her about admission to the hospital for COPD exacerbation versus discharge home.  She says she would rather go home if at all possible.  I recommended we turn off the oxygen that she was still on and give her a little bit of time to calm down from the albuterol and to see how her  breathing is doing.  I also suggested to her nurse that she be ambulated prior to discharge if in fact that is what she wants to do to make sure she does not again become hypoxemic.  I transferred ED care to Dr. Charna Archer to reassess the patient and determine if she needs admission or discharge home.  I also electronically prescribed a prednisone burst and azithromycin as empiric treatment for COPD exacerbation and the questionable atelectasis versus infection seen on chest x-ray if in fact she is able to go home.   [CF]  0724 SARS Coronavirus 2 by RT PCR: NEGATIVE [CF]    Clinical Course User Index [CF] Hinda Kehr, MD     ____________________________________________  FINAL CLINICAL IMPRESSION(S) / ED DIAGNOSES  Final diagnoses:  Acute dyspnea  COPD exacerbation ([place])     MEDICATIONS GIVEN DURING THIS VISIT:  Medications  ipratropium-albuterol (DUONEB) 0.5-2.5 (3) MG/3ML nebulizer solution 3 mL (has no administration in time range)  ipratropium-albuterol (DUONEB) 0.5-2.5 (3) MG/3ML nebulizer solution 3 mL (has no administration in time range)  ipratropium-albuterol (DUONEB) 0.5-2.5 (3) MG/3ML nebulizer solution 3 mL (has no administration in time range)  predniSONE (DELTASONE) tablet 60 mg (has no administration in time range)  ketorolac (TORADOL) 30 MG/ML injection 15 mg (has no administration in time range)     ED Discharge Orders    None      *Please note:  Colleen [name] was evaluated in Emergency Department on 10/16/2020 for the symptoms described in the history of present illness. She was evaluated in the context of the global COVID-19 pandemic, which necessitated consideration that the patient might be at risk for infection with the SARS-CoV-2 virus that causes COVID-19. Institutional protocols and algorithms that pertain to the evaluation of patients at risk for COVID-19 are in a state of rapid change based on information released by regulatory bodies including the CDC and  federal and state organizations. These policies and algorithms were followed during the patient's care in the ED.  Some ED evaluations and interventions may be delayed as a result of limited staffing during and after  the pandemic.*  Note:  This document was prepared using Dragon voice recognition software and may include unintentional dictation errors.   Hinda Kehr, MD 10/16/20 [phone]

## 2020-10-16 NOTE — ED Notes (Signed)
Pt placed back on 2L via [place]

## 2020-10-16 NOTE — ED Provider Notes (Signed)
-----------------------------------------   7:44 AM on 10/16/2020 -----------------------------------------  Blood pressure (!) 117/58, pulse 93, temperature 98 F (36.7 C), temperature source Oral, resp. rate (!) 22, height 5\' 11"  (1.803 m), weight 63.5 kg, SpO2 100 %.  Assuming care from Dr. Karma Greaser.  In short, Colleen [name] is a 59 y.o. female with a chief complaint of Shortness of Breath .  Refer to the original H&P for additional details.  The current plan of care is to reassess following treatment for COPD exacerbation.  ----------------------------------------- 9:40 AM on 10/16/2020 -----------------------------------------  On reassessment, patient with minimal wheezing and is maintaining her O2 sats around 92% on room air.  She did have some itching that was improved following dose of Benadryl, no signs of anaphylaxis at this time.  She was offered admission for COPD exacerbation, but states she feels better and would like to go home.  She was counseled to follow-up with her PCP and return to the ED for new worsening symptoms.  Patient prescribed antibiotics and steroids by Dr. Karma Greaser, patient agrees with plan.    Blake Divine, MD 10/16/20 [phone]

## 2020-10-17 ENCOUNTER — Telehealth: Payer: Self-pay | Admitting: Student in an Organized Health Care Education/Training Program

## 2020-10-17 NOTE — Telephone Encounter (Signed)
Called patient, She went to the ED 10/16/20 for sinus drainage. She aspirated some drainage they put her on Predisone and antibiotic for four days. She will be finished taking the medications Saturday. Is it [place] for her to have procedure on Monday. She is planning on coming in if she does not here something for [place] early Monday morning.

## 2020-10-17 NOTE — Telephone Encounter (Signed)
Patient called stating she went to the ED yesterday and wants to know can she still have her epid that is scheduled for Monday?

## 2020-10-22 ENCOUNTER — Ambulatory Visit
Admission: RE | Admit: 2020-10-22 | Discharge: 2020-10-22 | Disposition: A | Discharge status: Home / Self Care | Payer: 59 | Source: Ambulatory Visit | Attending: Student in an Organized Health Care Education/Training Program | Admitting: Student in an Organized Health Care Education/Training Program

## 2020-10-22 ENCOUNTER — Encounter: Payer: Self-pay | Admitting: Student in an Organized Health Care Education/Training Program

## 2020-10-22 ENCOUNTER — Ambulatory Visit (HOSPITAL_BASED_OUTPATIENT_CLINIC_OR_DEPARTMENT_OTHER): Payer: 59 | Admitting: Student in an Organized Health Care Education/Training Program

## 2020-10-22 ENCOUNTER — Other Ambulatory Visit: Payer: Self-pay

## 2020-10-22 DIAGNOSIS — Z885 Allergy status to narcotic agent status: Secondary | ICD-10-CM | POA: Insufficient documentation

## 2020-10-22 DIAGNOSIS — M5116 Intervertebral disc disorders with radiculopathy, lumbar region: Secondary | ICD-10-CM | POA: Insufficient documentation

## 2020-10-22 DIAGNOSIS — G894 Chronic pain syndrome: Secondary | ICD-10-CM | POA: Diagnosis not present

## 2020-10-22 DIAGNOSIS — M5126 Other intervertebral disc displacement, lumbar region: Secondary | ICD-10-CM

## 2020-10-22 DIAGNOSIS — M5416 Radiculopathy, lumbar region: Secondary | ICD-10-CM | POA: Diagnosis not present

## 2020-10-22 MED ORDER — SODIUM CHLORIDE 0.9% FLUSH
2.0000 mL | Freq: Once | INTRAVENOUS | Status: AC
  Administered 2020-10-22: 2 mL

## 2020-10-22 MED ORDER — DEXAMETHASONE SODIUM PHOSPHATE 10 MG/ML IJ SOLN
10.0000 mg | Freq: Once | INTRAMUSCULAR | Status: AC
  Administered 2020-10-22: 10 mg
  Filled 2020-10-22: qty 1

## 2020-10-22 MED ORDER — ROPIVACAINE HCL 2 MG/ML IJ SOLN
2.0000 mL | Freq: Once | INTRAMUSCULAR | Status: AC
  Administered 2020-10-22: 2 mL via EPIDURAL
  Filled 2020-10-22: qty 10

## 2020-10-22 MED ORDER — LIDOCAINE HCL 2 % IJ SOLN
20.0000 mL | Freq: Once | INTRAMUSCULAR | Status: AC
  Administered 2020-10-22: 200 mg

## 2020-10-22 NOTE — Progress Notes (Signed)
Safety precautions to be maintained throughout the outpatient stay will include: orient to surroundings, keep bed in low position, maintain call bell within reach at all times, provide assistance with transfer out of bed and ambulation.  

## 2020-10-22 NOTE — Patient Instructions (Signed)
Pain Management Discharge Instructions  General Discharge Instructions :  If you need to reach your doctor call: Monday-Friday 8:00 am - 4:00 pm at [phone] or toll free [phone].  After clinic hours [phone] to have operator reach doctor.  Bring all of your medication bottles to all your appointments in the pain clinic.  To cancel or reschedule your appointment with Pain Management please remember to call 24 hours in advance to avoid a fee.  Refer to the educational materials which you have been given on: General Risks, I had my Procedure. Discharge Instructions, Post Sedation.  Post Procedure Instructions:  The drugs you were given will stay in your system until tomorrow, so for the next 24 hours you should not drive, make any legal decisions or drink any alcoholic beverages.  You may eat anything you prefer, but it is better to start with liquids then soups and crackers, and gradually work up to solid foods.  Please notify your doctor immediately if you have any unusual bleeding, trouble breathing or pain that is not related to your normal pain.  Depending on the type of procedure that was done, some parts of your body may feel week and/or numb.  This usually clears up by tonight or the next day.  Walk with the use of an assistive device or accompanied by an adult for the 24 hours.  You may use ice on the affected area for the first 24 hours.  Put ice in a Ziploc bag and cover with a towel and place against area 15 minutes on 15 minutes off.  You may switch to heat after 24 hours.GENERAL RISKS AND COMPLICATIONS  What are the risk, side effects and possible complications? Generally speaking, most procedures are safe.  However, with any procedure there are risks, side effects, and the possibility of complications.  The risks and complications are dependent upon the sites that are lesioned, or the type of nerve block to be performed.  The closer the procedure is to the spine,  the more serious the risks are.  Great care is taken when placing the radio frequency needles, block needles or lesioning probes, but sometimes complications can occur. 1. Infection: Any time there is an injection through the skin, there is a risk of infection.  This is why sterile conditions are used for these blocks.  There are four possible types of infection. 1. Localized skin infection. 2. Central Nervous System Infection-This can be in the form of Meningitis, which can be deadly. 3. Epidural Infections-This can be in the form of an epidural abscess, which can cause pressure inside of the spine, causing compression of the spinal cord with subsequent paralysis. This would require an emergency surgery to decompress, and there are no guarantees that the patient would recover from the paralysis. 4. Discitis-This is an infection of the intervertebral discs.  It occurs in about 1% of discography procedures.  It is difficult to treat and it may lead to surgery.        2. Pain: the needles have to go through skin and soft tissues, will cause soreness.       3. Damage to internal structures:  The nerves to be lesioned may be near blood vessels or    other nerves which can be potentially damaged.       4. Bleeding: Bleeding is more common if the patient is taking blood thinners such as  aspirin, Coumadin, Ticiid, Plavix, etc., or if he/she have some genetic predisposition  such as   hemophilia. Bleeding into the spinal canal can cause compression of the spinal  cord with subsequent paralysis.  This would require an emergency surgery to  decompress and there are no guarantees that the patient would recover from the  paralysis.       5. Pneumothorax:  Puncturing of a lung is a possibility, every time a needle is introduced in  the area of the chest or upper back.  Pneumothorax refers to free air around the  collapsed lung(s), inside of the thoracic cavity (chest cavity).  Another two possible  complications  related to a similar event would include: Hemothorax and Chylothorax.   These are variations of the Pneumothorax, where instead of air around the collapsed  lung(s), you may have blood or chyle, respectively.       6. Spinal headaches: They may occur with any procedures in the area of the spine.       7. Persistent CSF (Cerebro-Spinal Fluid) leakage: This is a rare problem, but may occur  with prolonged intrathecal or epidural catheters either due to the formation of a fistulous  track or a dural tear.       8. Nerve damage: By working so close to the spinal cord, there is always a possibility of  nerve damage, which could be as serious as a permanent spinal cord injury with  paralysis.       9. Death:  Although rare, severe deadly allergic reactions known as "Anaphylactic  reaction" can occur to any of the medications used.      10. Worsening of the symptoms:  We can always make thing worse.  What are the chances of something like this happening? Chances of any of this occuring are extremely low.  By statistics, you have more of a chance of getting killed in a motor vehicle accident: while driving to the hospital than any of the above occurring .  Nevertheless, you should be aware that they are possibilities.  In general, it is similar to taking a shower.  Everybody knows that you can slip, hit your head and get killed.  Does that mean that you should not shower again?  Nevertheless always keep in mind that statistics do not mean anything if you happen to be on the wrong side of them.  Even if a procedure has a 1 (one) in a 1,000,000 (million) chance of going wrong, it you happen to be that one..Also, keep in mind that by statistics, you have more of a chance of having something go wrong when taking medications.  Who should not have this procedure? If you are on a blood thinning medication (e.g. Coumadin, Plavix, see list of "Blood Thinners"), or if you have an active infection going on, you should not  have the procedure.  If you are taking any blood thinners, please inform your physician.  How should I prepare for this procedure?  Do not eat or drink anything at least six hours prior to the procedure.  Bring a driver with you .  It cannot be a taxi.  Come accompanied by an adult that can drive you back, and that is strong enough to help you if your legs get weak or numb from the local anesthetic.  Take all of your medicines the morning of the procedure with just enough water to swallow them.  If you have diabetes, make sure that you are scheduled to have your procedure done first thing in the morning, whenever possible.  If you have diabetes,   take only half of your insulin dose and notify our nurse that you have done so as soon as you arrive at the clinic.  If you are diabetic, but only take blood sugar pills (oral hypoglycemic), then do not take them on the morning of your procedure.  You may take them after you have had the procedure.  Do not take aspirin or any aspirin-containing medications, at least eleven (11) days prior to the procedure.  They may prolong bleeding.  Wear loose fitting clothing that may be easy to take off and that you would not mind if it got stained with Betadine or blood.  Do not wear any jewelry or perfume  Remove any nail coloring.  It will interfere with some of our monitoring equipment.  NOTE: Remember that this is not meant to be interpreted as a complete list of all possible complications.  Unforeseen problems may occur.  BLOOD THINNERS The following drugs contain aspirin or other products, which can cause increased bleeding during surgery and should not be taken for 2 weeks prior to and 1 week after surgery.  If you should need take something for relief of minor pain, you may take acetaminophen which is found in Tylenol,m Datril, Anacin-3 and Panadol. It is not blood thinner. The products listed below are.  Do not take any of the products listed below  in addition to any listed on your instruction sheet.  A.P.C or A.P.C with Codeine Codeine Phosphate Capsules #3 Ibuprofen Ridaura  ABC compound Congesprin Imuran rimadil  Advil Cope Indocin Robaxisal  Alka-Seltzer Effervescent Pain Reliever and Antacid Coricidin or Coricidin-D  Indomethacin Rufen  Alka-Seltzer plus Cold Medicine Cosprin Ketoprofen S-A-C Tablets  Anacin Analgesic Tablets or Capsules Coumadin Korlgesic Salflex  Anacin Extra Strength Analgesic tablets or capsules CP-2 Tablets Lanoril Salicylate  Anaprox Cuprimine Capsules Levenox Salocol  Anexsia-D Dalteparin Magan Salsalate  Anodynos Darvon compound Magnesium Salicylate Sine-off  Ansaid Dasin Capsules Magsal Sodium Salicylate  Anturane Depen Capsules Marnal Soma  APF Arthritis pain formula Dewitt's Pills Measurin Stanback  Argesic Dia-Gesic Meclofenamic Sulfinpyrazone  Arthritis Bayer Timed Release Aspirin Diclofenac Meclomen Sulindac  Arthritis pain formula Anacin Dicumarol Medipren Supac  Analgesic (Safety coated) Arthralgen Diffunasal Mefanamic Suprofen  Arthritis Strength Bufferin Dihydrocodeine Mepro Compound Suprol  Arthropan liquid Dopirydamole Methcarbomol with Aspirin Synalgos  ASA tablets/Enseals Disalcid Micrainin Tagament  Ascriptin Doan's Midol Talwin  Ascriptin A/D Dolene Mobidin Tanderil  Ascriptin Extra Strength Dolobid Moblgesic Ticlid  Ascriptin with Codeine Doloprin or Doloprin with Codeine Momentum Tolectin  Asperbuf Duoprin Mono-gesic Trendar  Aspergum Duradyne Motrin or Motrin IB Triminicin  Aspirin plain, buffered or enteric coated Durasal Myochrisine Trigesic  Aspirin Suppositories Easprin Nalfon Trillsate  Aspirin with Codeine Ecotrin Regular or Extra Strength Naprosyn Uracel  Atromid-S Efficin Naproxen Ursinus  Auranofin Capsules Elmiron Neocylate Vanquish  Axotal Emagrin Norgesic Verin  Azathioprine Empirin or Empirin with Codeine Normiflo Vitamin E  Azolid Emprazil Nuprin Voltaren  Bayer  Aspirin plain, buffered or children's or timed BC Tablets or powders Encaprin Orgaran Warfarin Sodium  Buff-a-Comp Enoxaparin Orudis Zorpin  Buff-a-Comp with Codeine Equegesic Os-Cal-Gesic   Buffaprin Excedrin plain, buffered or Extra Strength Oxalid   Bufferin Arthritis Strength Feldene Oxphenbutazone   Bufferin plain or Extra Strength Feldene Capsules Oxycodone with Aspirin   Bufferin with Codeine Fenoprofen Fenoprofen Pabalate or Pabalate-SF   Buffets II Flogesic Panagesic   Buffinol plain or Extra Strength Florinal or Florinal with Codeine Panwarfarin   Buf-Tabs Flurbiprofen Penicillamine   Butalbital Compound Four-way cold tablets   Penicillin   Butazolidin Fragmin Pepto-Bismol   Carbenicillin Geminisyn Percodan   Carna Arthritis Reliever Geopen Persantine   Carprofen Gold's salt Persistin   Chloramphenicol Goody's Phenylbutazone   Chloromycetin Haltrain Piroxlcam   Clmetidine heparin Plaquenil   Cllnoril Hyco-pap Ponstel   Clofibrate Hydroxy chloroquine Propoxyphen         Before stopping any of these medications, be sure to consult the physician who ordered them.  Some, such as Coumadin (Warfarin) are ordered to prevent or treat serious conditions such as "deep thrombosis", "pumonary embolisms", and other heart problems.  The amount of time that you may need off of the medication may also vary with the medication and the reason for which you were taking it.  If you are taking any of these medications, please make sure you notify your pain physician before you undergo any procedures.         Epidural Steroid Injection Patient Information  Description: The epidural space surrounds the nerves as they exit the spinal cord.  In some patients, the nerves can be compressed and inflamed by a bulging disc or a tight spinal canal (spinal stenosis).  By injecting steroids into the epidural space, we can bring irritated nerves into direct contact with a potentially helpful medication.   These steroids act directly on the irritated nerves and can reduce swelling and inflammation which often leads to decreased pain.  Epidural steroids may be injected anywhere along the spine and from the neck to the low back depending upon the location of your pain.   After numbing the skin with local anesthetic (like Novocaine), a small needle is passed into the epidural space slowly.  You may experience a sensation of pressure while this is being done.  The entire block usually last less than 10 minutes.  Conditions which may be treated by epidural steroids:   Low back and leg pain  Neck and arm pain  Spinal stenosis  Post-laminectomy syndrome  Herpes zoster (shingles) pain  Pain from compression fractures  Preparation for the injection:  1. Do not eat any solid food or dairy products within 8 hours of your appointment.  2. You may drink clear liquids up to 3 hours before appointment.  Clear liquids include water, black coffee, juice or soda.  No milk or cream please. 3. You may take your regular medication, including pain medications, with a sip of water before your appointment  Diabetics should hold regular insulin (if taken separately) and take 1/2 normal NPH dos the morning of the procedure.  Carry some sugar containing items with you to your appointment. 4. A driver must accompany you and be prepared to drive you home after your procedure.  5. Bring all your current medications with your. 6. An IV may be inserted and sedation may be given at the discretion of the physician.   7. A blood pressure cuff, EKG and other monitors will often be applied during the procedure.  Some patients may need to have extra oxygen administered for a short period. 8. You will be asked to provide medical information, including your allergies, prior to the procedure.  We must know immediately if you are taking blood thinners (like Coumadin/Warfarin)  Or if you are allergic to IV iodine contrast (dye). We  must know if you could possible be pregnant.  Possible side-effects:  Bleeding from needle site  Infection (rare, may require surgery)  Nerve injury (rare)  Numbness & tingling (temporary)  Difficulty urinating (rare, temporary)  Spinal headache (   a headache worse with upright posture)  Light -headedness (temporary)  Pain at injection site (several days)  Decreased blood pressure (temporary)  Weakness in arm/leg (temporary)  Pressure sensation in back/neck (temporary)  Call if you experience:  Fever/chills associated with headache or increased back/neck pain.  Headache worsened by an upright position.  New onset weakness or numbness of an extremity below the injection site  Hives or difficulty breathing (go to the emergency room)  Inflammation or drainage at the infection site  Severe back/neck pain  Any new symptoms which are concerning to you  Please note:  Although the local anesthetic injected can often make your back or neck feel good for several hours after the injection, the pain will likely return.  It takes 3-7 days for steroids to work in the epidural space.  You may not notice any pain relief for at least that one week.  If effective, we will often do a series of three injections spaced 3-6 weeks apart to maximally decrease your pain.  After the initial series, we generally will wait several months before considering a repeat injection of the same type.  If you have any questions, please call [phone] [facility] 

## 2020-10-22 NOTE — Progress Notes (Signed)
PROVIDER NOTE: Information contained herein reflects review and annotations entered in association with encounter. Interpretation of such information and data should be left to medically-trained personnel. Information provided to patient can be located elsewhere in the medical record under "Patient Instructions". Document created using STT-dictation technology, any transcriptional errors that may result from process are unintentional.    Patient: Colleen [name]  Service Category: Procedure  Provider: Gillis Santa, MD  DOB: February 21, 1961  DOS: 10/22/2020  Location: [facility]  MRN: [id number]  Setting: Ambulatory - outpatient  Referring Provider: Gillis Santa, MD  Type: Established Patient  Specialty: Interventional Pain Management  PCP: Remi Haggard, FNP   Primary Reason for Visit: Interventional Pain Management Treatment. CC: Back Pain (lower)  Procedure:          Anesthesia, Analgesia, Anxiolysis:  Type: Therapeutic Inter-Laminar Epidural Steroid Injection  #2 (#1 done 06/11/2020) Region: Lumbar Level: L4-5 Level. Laterality: [name]         Type: Local Anesthesia   Local Anesthetic: Lidocaine 1-2%  Position: Prone with head of the table was raised to facilitate breathing.   Indications: 1. 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, L5/S1)   2. Lumbar radiculopathy   3. Chronic pain syndrome    Pain Score: Pre-procedure: 7 /10 Post-procedure: 5 /10   Pre-op Assessment:  Colleen [name] is a 59 y.o. (year old), female patient, seen today for interventional treatment. She  has a past surgical history that includes Nephrectomy (Left); Mastectomy (Left); Tubal ligation; Mastectomy; Breast biopsy (Left, 2004); Breast biopsy ([name]); and Breast lumpectomy (Left, 2004). Colleen [name] has a current medication list which includes the following prescription(s): aspirin ec, cetirizine-pseudoephedrine, cholecalciferol, denosumab, epinephrine, trelegy ellipta, hydroxyzine, montelukast, pregabalin,  ventolin hfa, azithromycin, baclofen, and prednisone. Her primarily concern today is the Back Pain (lower)  Initial Vital Signs:  Pulse/HCG Rate: 94ECG Heart Rate: 91 Temp: (!) 97.2 F (36.2 C) Resp: 16 BP: (!) 147/95 SpO2: 94 %  BMI: Estimated body mass index is 23.3 kg/m as calculated from the following:   Height as of this encounter: 5\' 5"  (1.651 m).   Weight as of this encounter: 140 lb (63.5 kg).  Risk Assessment: Allergies: Reviewed. She is allergic to desvenlafaxine, morphine and related, penicillins, prazosin, tamoxifen, trazodone, clonazepam, codeine, hydroxyzine, paroxetine hcl, and sulfa antibiotics.  Allergy Precautions: None required Coagulopathies: Reviewed. None identified.  Blood-thinner therapy: None at this time Active Infection(s): Reviewed. None identified. Colleen [name] is afebrile  Site Confirmation: Colleen [name] was asked to confirm the procedure and laterality before marking the site Procedure checklist: Completed Consent: Before the procedure and under the influence of no sedative(s), amnesic(s), or anxiolytics, the patient was informed of the treatment options, risks and possible complications. To fulfill our ethical and legal obligations, as recommended by the American Medical Association's Code of Ethics, I have informed the patient of my clinical impression; the nature and purpose of the treatment or procedure; the risks, benefits, and possible complications of the intervention; the alternatives, including doing nothing; the risk(s) and benefit(s) of the alternative treatment(s) or procedure(s); and the risk(s) and benefit(s) of doing nothing. The patient was provided information about the general risks and possible complications associated with the procedure. These may include, but are not limited to: failure to achieve desired goals, infection, bleeding, organ or nerve damage, allergic reactions, paralysis, and death. In addition, the patient was informed of those  risks and complications associated to Spine-related procedures, such as failure to decrease pain; infection (i.e.: Meningitis, epidural or intraspinal abscess);  bleeding (i.e.: epidural hematoma, subarachnoid hemorrhage, or any other type of intraspinal or peri-dural bleeding); organ or nerve damage (i.e.: Any type of peripheral nerve, nerve root, or spinal cord injury) with subsequent damage to sensory, motor, and/or autonomic systems, resulting in permanent pain, numbness, and/or weakness of one or several areas of the body; allergic reactions; (i.e.: anaphylactic reaction); and/or death. Furthermore, the patient was informed of those risks and complications associated with the medications. These include, but are not limited to: allergic reactions (i.e.: anaphylactic or anaphylactoid reaction(s)); adrenal axis suppression; blood sugar elevation that in diabetics may result in ketoacidosis or comma; water retention that in patients with history of congestive heart failure may result in shortness of breath, pulmonary edema, and decompensation with resultant heart failure; weight gain; swelling or edema; medication-induced neural toxicity; particulate matter embolism and blood vessel occlusion with resultant organ, and/or nervous system infarction; and/or aseptic necrosis of one or more joints. Finally, the patient was informed that Medicine is not an exact science; therefore, there is also the possibility of unforeseen or unpredictable risks and/or possible complications that may result in a catastrophic outcome. The patient indicated having understood very clearly. We have given the patient no guarantees and we have made no promises. Enough time was given to the patient to ask questions, all of which were answered to the patient's satisfaction. Colleen [name] has indicated that she wanted to continue with the procedure. Attestation: I, the ordering provider, attest that I have discussed with the patient the  benefits, risks, side-effects, alternatives, likelihood of achieving goals, and potential problems during recovery for the procedure that I have provided informed consent. Date  Time: 10/22/2020  8:27 AM  Pre-Procedure Preparation:  Monitoring: As per clinic protocol. Respiration, ETCO2, SpO2, BP, heart rate and rhythm monitor placed and checked for adequate function Safety Precautions: Patient was assessed for positional comfort and pressure points before starting the procedure. Time-out: I initiated and conducted the "Time-out" before starting the procedure, as per protocol. The patient was asked to participate by confirming the accuracy of the "Time Out" information. Verification of the correct person, site, and procedure were performed and confirmed by me, the nursing staff, and the patient. "Time-out" conducted as per Joint Commission's Universal Protocol (UP.01.01.01). Time: 1001  Description of Procedure:          Target Area: The interlaminar space, initially targeting the lower laminar border of the superior vertebral body. Approach: Paramedial approach. Area Prepped: Entire Posterior Lumbar Region DuraPrep (Iodine Povacrylex [0.7% available iodine] and Isopropyl Alcohol, 74% w/w) Safety Precautions: Aspiration looking for blood return was conducted prior to all injections. At no point did we inject any substances, as a needle was being advanced. No attempts were made at seeking any paresthesias. Safe injection practices and needle disposal techniques used. Medications properly checked for expiration dates. SDV (single dose vial) medications used. Description of the Procedure: Protocol guidelines were followed. The procedure needle was introduced through the skin, ipsilateral to the reported pain, and advanced to the target area. Bone was contacted and the needle walked caudad, until the lamina was cleared. The epidural space was identified using "loss-of-resistance technique" with 2-3 ml of  PF-NaCl (0.9% NSS), in a 5cc LOR glass syringe.  Vitals:   10/22/20 0956 10/22/20 1000 10/22/20 1005 10/22/20 1009  BP: (!) 152/89 (!) 146/90 (!) 152/97 (!) 138/95  Pulse:      Resp: 16 16 18 16   Temp:      TempSrc:  SpO2: 96% 95% 95% 95%  Weight:      Height:        Start Time: 1001 hrs. End Time: 1009 hrs.  Materials:  Needle(s) Type: Epidural needle Gauge: 22G Length: 3.5-in Medication(s): Please see orders for medications and dosing details. 7 cc solution made of 3 cc of preservative-free saline, 3 cc of 0.2% ropivacaine, 1 cc of Decadron 10 mg/cc.  Imaging Guidance (Spinal):          Type of Imaging Technique: Fluoroscopy Guidance (Spinal) Indication(s): Assistance in needle guidance and placement for procedures requiring needle placement in or near specific anatomical locations not easily accessible without such assistance. Exposure Time: Please see nurses notes. Contrast: Before injecting any contrast, we confirmed that the patient did not have an allergy to iodine, shellfish, or radiological contrast. Once satisfactory needle placement was completed at the desired level, radiological contrast was injected. Contrast injected under live fluoroscopy. No contrast complications. See chart for type and volume of contrast used. Fluoroscopic Guidance: I was personally present during the use of fluoroscopy. "Tunnel Vision Technique" used to obtain the best possible view of the target area. Parallax error corrected before commencing the procedure. "Direction-depth-direction" technique used to introduce the needle under continuous pulsed fluoroscopy. Once target was reached, antero-posterior, oblique, and lateral fluoroscopic projection used confirm needle placement in all planes. Images permanently stored in EMR. Interpretation: I personally interpreted the imaging intraoperatively. Adequate needle placement confirmed in multiple planes. Appropriate spread of contrast into desired  area was observed. No evidence of afferent or efferent intravascular uptake. No intrathecal or subarachnoid spread observed. Permanent images saved into the patient's record.  Antibiotic Prophylaxis:   Anti-infectives (From admission, onward)   None     Indication(s): None identified  Post-operative Assessment:  Post-procedure Vital Signs:  Pulse/HCG Rate: 9490 Temp: (!) 97.2 F (36.2 C) Resp: 16 BP: (!) 138/95 SpO2: 95 %  EBL: None  Complications: No immediate post-treatment complications observed by team, or reported by patient.  Note: The patient tolerated the entire procedure well. A repeat set of vitals were taken after the procedure and the patient was kept under observation following institutional policy, for this type of procedure. Post-procedural neurological assessment was performed, showing return to baseline, prior to discharge. The patient was provided with post-procedure discharge instructions, including a section on how to identify potential problems. Should any problems arise concerning this procedure, the patient was given instructions to immediately contact us, at any time, without hesitation. In any case, we plan to contact the patient by telephone for a follow-up status report regarding this interventional procedure.  Comments:  No additional relevant information.  Plan of Care  Orders:  Orders Placed This Encounter  Procedures  . [facility] C-ARM 1-60 MIN NO REPORT    Intraoperative interpretation by procedural physician at [facility].    Standing Status:   Standing    Number of Occurrences:   1    Order Specific Question:   Reason for exam:    Answer:   Assistance in needle guidance and placement for procedures requiring needle placement in or near specific anatomical locations not easily accessible without such assistance.   Medications ordered for procedure: Meds ordered this encounter  Medications  . lidocaine (XYLOCAINE) 2 % (with  pres) injection 400 mg  . ropivacaine (PF) 2 mg/mL (0.2%) (NAROPIN) injection 2 mL  . sodium chloride flush (NS) 0.9 % injection 2 mL  . dexamethasone (DECADRON) injection 10 mg   Medications  administered: We administered lidocaine, ropivacaine (PF) 2 mg/mL (0.2%), sodium chloride flush, and dexamethasone.  See the medical record for exact dosing, route, and time of administration.  Follow-up plan:   No follow-ups on file.    Recent Visits Date Type Provider Dept  08/16/20 Office Visit Gillis Santa, MD [facility]  07/25/20 Procedure visit Gillis Santa, MD [facility]  Showing recent visits within past 90 days and meeting all other requirements Today's Visits Date Type Provider Dept  10/22/20 Procedure visit Gillis Santa, MD [facility]  Showing today's visits and meeting all other requirements Future Appointments Date Type Provider Dept  11/01/20 Appointment Gillis Santa, MD [facility]  Showing future appointments within next 90 days and meeting all other requirements  Disposition: Discharge home  Discharge (Date  Time): 10/22/2020; 1020 hrs.   Primary Care Physician: Remi Haggard, FNP Location: [facility] Note by: Gillis Santa, MD Date: 10/22/2020; Time: 10:56 AM  Disclaimer:  Medicine is not an exact science. The only guarantee in medicine is that nothing is guaranteed. It is important to note that the decision to proceed with this intervention was based on the information collected from the patient. The Data and conclusions were drawn from the patient's questionnaire, the interview, and the physical examination. Because the information was provided in large part by the patient, it cannot be guaranteed that it has not been purposely or unconsciously manipulated. Every effort has been made to obtain as much relevant data as possible for this evaluation. It is important to note that the conclusions  that lead to this procedure are derived in large part from the available data. Always take into account that the treatment will also be dependent on availability of resources and existing treatment guidelines, considered by other Pain Management Practitioners as being common knowledge and practice, at the time of the intervention. For Medico-Legal purposes, it is also important to point out that variation in procedural techniques and pharmacological choices are the acceptable norm. The indications, contraindications, technique, and results of the above procedure should only be interpreted and judged by a Board-Certified Interventional Pain Specialist with extensive familiarity and expertise in the same exact procedure and technique.

## 2020-10-23 ENCOUNTER — Telehealth: Payer: Self-pay | Admitting: *Deleted

## 2020-10-23 NOTE — Telephone Encounter (Signed)
No problems post procedure. 

## 2020-11-01 ENCOUNTER — Ambulatory Visit
Payer: 59 | Attending: Student in an Organized Health Care Education/Training Program | Admitting: Student in an Organized Health Care Education/Training Program

## 2020-11-01 ENCOUNTER — Encounter: Payer: Self-pay | Admitting: Student in an Organized Health Care Education/Training Program

## 2020-11-01 ENCOUNTER — Other Ambulatory Visit: Payer: Self-pay

## 2020-11-01 VITALS — BP 122/83 | HR 105 | Temp 97.7°F | Resp 18 | Ht 60.0 in | Wt 140.0 lb

## 2020-11-01 DIAGNOSIS — M47812 Spondylosis without myelopathy or radiculopathy, cervical region: Secondary | ICD-10-CM | POA: Insufficient documentation

## 2020-11-01 DIAGNOSIS — M47816 Spondylosis without myelopathy or radiculopathy, lumbar region: Secondary | ICD-10-CM | POA: Diagnosis present

## 2020-11-01 DIAGNOSIS — M5416 Radiculopathy, lumbar region: Secondary | ICD-10-CM | POA: Insufficient documentation

## 2020-11-01 DIAGNOSIS — G8929 Other chronic pain: Secondary | ICD-10-CM | POA: Diagnosis present

## 2020-11-01 DIAGNOSIS — G894 Chronic pain syndrome: Secondary | ICD-10-CM | POA: Insufficient documentation

## 2020-11-01 DIAGNOSIS — M545 Low back pain, unspecified: Secondary | ICD-10-CM | POA: Diagnosis present

## 2020-11-01 DIAGNOSIS — M7918 Myalgia, other site: Secondary | ICD-10-CM

## 2020-11-01 DIAGNOSIS — M5126 Other intervertebral disc displacement, lumbar region: Secondary | ICD-10-CM | POA: Diagnosis present

## 2020-11-01 MED ORDER — MELOXICAM 7.5 MG PO TABS: 7.5000 mg | ORAL_TABLET | Freq: Every day | ORAL | 2 refills | Status: DC | PRN

## 2020-11-01 NOTE — Progress Notes (Signed)
Nursing Pain Medication Assessment:  Safety precautions to be maintained throughout the outpatient stay will include: orient to surroundings, keep bed in low position, maintain call bell within reach at all times, provide assistance with transfer out of bed and ambulation.  Medication Inspection Compliance: Pill count conducted under aseptic conditions, in front of the patient. Neither the pills nor the bottle was removed from the patient's sight at any time. Once count was completed pills were immediately returned to the patient in their original bottle.  Medication: Tramadol (Ultram) Pill/Patch Count: 32 of 45 pills remain Pill/Patch Appearance: Markings consistent with prescribed medication Bottle Appearance: Standard pharmacy container. Clearly labeled. Filled Date: 10 / 07 / 2021 Last Medication intake:  Yesterday

## 2020-11-01 NOTE — Progress Notes (Signed)
PROVIDER NOTE: Information contained herein reflects review and annotations entered in association with encounter. Interpretation of such information and data should be left to medically-trained personnel. Information provided to patient can be located elsewhere in the medical record under "Patient Instructions". Document created using STT-dictation technology, any transcriptional errors that may result from process are unintentional.    Patient: Colleen [name]  Service Category: E/M  Provider: Gillis Santa, MD  DOB: 10-24-1961  DOS: 11/01/2020  Specialty: Interventional Pain Management  MRN: [id number]  Setting: Ambulatory outpatient  PCP: Remi Haggard, FNP  Type: Established Patient    Referring Provider: Gae Bon, NP  Location: Office  Delivery: Face-to-face     HPI  Colleen [name], a 59 y.o. year old female, is here today because of her Lumbar disc herniation [M51.26]. Colleen [name] primary complain today is Back Pain (lower) and Neck Pain Last encounter: My last encounter with her was on 10/22/2020. Pertinent problems: Colleen [name] has 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, L5/S1); Lumbar facet arthropathy (L4,5 S1); Cervical facet joint syndrome; Osteopenia of multiple sites; Cervical myofascial pain syndrome; and Lumbar radiculopathy on their pertinent problem list. Pain Assessment: Severity of Chronic pain is reported as a 5 /10. Location: Back Lower,[name],Left/down to knee area bilat, [name] side is worse. Onset: More than a month ago. Quality: Sharp,Shooting,Stabbing. Timing: Constant. Modifying factor(s): meds. Vitals:  height is 5' (1.524 m) and weight is 140 lb (63.5 kg). Her temporal temperature is 97.7 F (36.5 C). Her blood pressure is 122/83 and her pulse is 105 (abnormal). Her respiration is 18 and oxygen saturation is 98%.   Reason for encounter: both, medication management and post-procedure assessment.     Post-Procedure Evaluation  Procedure (10/22/2020):   Type:  Therapeutic Inter-Laminar Epidural Steroid Injection  #2 (#1 done 06/11/2020) Region: Lumbar Level: L4-5 Level. Laterality: [name]      Sedation: Please see nurses note.  Effectiveness during initial hour after procedure(Ultra-Short Term Relief): 50 %   Local anesthetic used: Long-acting (4-6 hours) Effectiveness: Defined as any analgesic benefit obtained secondary to the administration of local anesthetics. This carries significant diagnostic value as to the etiological location, or anatomical origin, of the pain. Duration of benefit is expected to coincide with the duration of the local anesthetic used.  Effectiveness during initial 4-6 hours after procedure(Short-Term Relief): 50 %   Long-term benefit: Defined as any relief past the pharmacologic duration of the local anesthetics.  Effectiveness past the initial 6 hours after procedure(Long-Term Relief): 50 % (pain not radiating down legs to extent that it had before procedure)   Current benefits: Defined as benefit that persist at this time.   Analgesia:  50% improved Function: Colleen [name] reports improvement in function ROM: Colleen [name] reports improvement in ROM    ROS  Constitutional: Denies any fever or chills Gastrointestinal: No reported hemesis, hematochezia, vomiting, or acute GI distress Musculoskeletal: Denies any acute onset joint swelling, redness, loss of ROM, or weakness Neurological: No reported episodes of acute onset apraxia, aphasia, dysarthria, agnosia, amnesia, paralysis, loss of coordination, or loss of consciousness  Medication Review  Albuterol, Cholecalciferol, EPINEPHrine, Fluticasone-Umeclidin-Vilant, albuterol, aspirin EC, cetirizine-pseudoephedrine, cyclobenzaprine, denosumab, hydrOXYzine, meloxicam, montelukast, pregabalin, and traMADol  History Review  Allergy: Colleen [name] is allergic to desvenlafaxine, morphine and related, penicillins, prazosin, tamoxifen, trazodone, clonazepam, codeine, hydroxyzine,  paroxetine hcl, and sulfa antibiotics. Drug: Colleen [name]  reports no history of drug use. Alcohol:  reports previous alcohol use. Tobacco:  reports that she has been smoking cigarettes. She has  been smoking about 0.50 packs per day. She has never used smokeless tobacco. Social: Colleen [name]  reports that she has been smoking cigarettes. She has been smoking about 0.50 packs per day. She has never used smokeless tobacco. She reports previous alcohol use. She reports that she does not use drugs. Medical:  has a past medical history of Anxiety, Asthma, Breast cancer ([place]) (2004), Chronic back pain, COPD (chronic obstructive pulmonary disease) ([place]), Depression, and Personal history of radiation therapy. Surgical: Colleen [name]  has a past surgical history that includes Nephrectomy (Left); Mastectomy (Left); Tubal ligation; Mastectomy; Breast biopsy (Left, 2004); Breast biopsy ([name]); and Breast lumpectomy (Left, 2004). Family: family history includes Breast cancer in her cousin and cousin.  Laboratory Chemistry Profile   Renal Lab Results  Component Value Date   BUN 9 10/16/2020   CREATININE 0.54 10/16/2020   GFRAA >60 03/21/2020   GFRNONAA >60 10/16/2020     Hepatic Lab Results  Component Value Date   AST 14 (L) 10/16/2020   ALT 11 10/16/2020   ALBUMIN 3.8 10/16/2020   ALKPHOS 37 (L) 10/16/2020   LIPASE 24 10/16/2020     Electrolytes Lab Results  Component Value Date   NA 139 10/16/2020   K 4.2 10/16/2020   CL 105 10/16/2020   CALCIUM 8.5 (L) 10/16/2020     Bone No results found for: VD25OH, ZT245YK9XIP, JA2505LZ7, QB3419FX9, 25OHVITD1, 25OHVITD2, 25OHVITD3, TESTOFREE, TESTOSTERONE   Inflammation (CRP: Acute Phase) (ESR: Chronic Phase) Lab Results  Component Value Date   LATICACIDVEN 0.78 10/11/2018       Note: Above Lab results reviewed.  Physical Exam  General appearance: Well nourished, well developed, and well hydrated. In no apparent acute distress Mental status:  Alert, oriented x 3 (person, place, & time)       Respiratory: No evidence of acute respiratory distress Eyes: PERLA Vitals: BP 122/83    Pulse (!) 105    Temp 97.7 F (36.5 C) (Temporal)    Resp 18    Ht 5' (1.524 m)    Wt 140 lb (63.5 kg)    SpO2 98%    BMI 27.34 kg/m  BMI: Estimated body mass index is 27.34 kg/m as calculated from the following:   Height as of this encounter: 5' (1.524 m).   Weight as of this encounter: 140 lb (63.5 kg). Ideal: Ideal body weight: 45.5 kg (100 lb 4.9 oz) Adjusted ideal body weight: 52.7 kg (116 lb 3 oz)  Cervical Spine Area Exam  Skin & Axial Inspection:No masses, redness, edema, swelling, or associated skin lesions Alignment:Symmetrical Functional KWI:OXBD restricted ROM, bilaterally Stability:No instability detected Muscle Tone/Strength:Functionally intact. No obvious neuro-muscular anomalies detected. Sensory (Neurological):Musculoskeletal pain pattern  Upper Extremity (UE) Exam    Side:[name] upper extremity  Side:Left upper extremity  Skin & Extremity Inspection:Skin color, temperature, and hair growth are WNL. No peripheral edema or cyanosis. No masses, redness, swelling, asymmetry, or associated skin lesions. No contractures.  Skin & Extremity Inspection:Skin color, temperature, and hair growth are WNL. No peripheral edema or cyanosis. No masses, redness, swelling, asymmetry, or associated skin lesions. No contractures.  Functional ZHG:DJMEQASTM ROMfor shoulder and elbow  Functional HDQ:QIWLNLGXQ ROMfor shoulder and elbow  Muscle Tone/Strength:Functionally intact. No obvious neuro-muscular anomalies detected.  Muscle Tone/Strength:Functionally intact. No obvious neuro-muscular anomalies detected.  Sensory (Neurological):Unimpaired  Sensory (Neurological):Unimpaired  Palpation:No palpable anomalies  Palpation:No palpable anomalies  Provocative Test(s): Phalen's  test:deferred Tinel's test:deferred Apley's scratch test (touch opposite shoulder): Action 1 (Across chest):deferred Action 2 (Overhead):deferred  Action 3 (LB reach):deferred   Provocative Test(s): Phalen's test:deferred Tinel's test:deferred Apley's scratch test (touch opposite shoulder): Action 1 (Across chest):deferred Action 2 (Overhead):deferred Action 3 (LB reach):deferred    Lumbar Spine Area Exam  Skin & Axial Inspection:No masses, redness, or swelling Alignment:Symmetrical Functional UTM:LYYTKPTW after treatment Stability:No instability detected Muscle Tone/Strength:Functionally intact. No obvious neuro-muscular anomalies detected. Sensory (Neurological):Improved  Ambulation:Limited Gait:Relatively normal for age and body habitus Posture:Difficulty standing up straight, due to pain Lower Extremity Exam    Side:[name] lower extremity  Side:Left lower extremity  Stability:No instability observed  Stability:No instability observed  Skin & Extremity Inspection:Skin color, temperature, and hair growth are WNL. No peripheral edema or cyanosis. No masses, redness, swelling, asymmetry, or associated skin lesions. No contractures.  Skin & Extremity Inspection:Skin color, temperature, and hair growth are WNL. No peripheral edema or cyanosis. No masses, redness, swelling, asymmetry, or associated skin lesions. No contractures.  Functional SFK:CLEXNTZGYFVC ROM   Functional BSW:HQPRFFMBWGYK ROM   Muscle Tone/Strength:Functionally intact. No obvious neuro-muscular anomalies detected.  Muscle Tone/Strength:Functionally intact. No obvious neuro-muscular anomalies detected.  Sensory (Neurological):Unimpaired  Sensory (Neurological):Unimpaired  DTR: Patellar:deferred today Achilles:deferred today Plantar:deferred today  DTR: Patellar:deferred today Achilles:deferred  today Plantar:deferred today  Palpation:No palpable anomalies  Palpation:No palpable anomalies      Assessment   Status Diagnosis  Controlled Controlled Controlled 1. 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, L5/S1)   2. Lumbar radiculopathy   3. Cervical facet joint syndrome (C3/4, C4/5)   4. Myofascial pain syndrome   5. Chronic bilateral low back pain without sciatica   6. Lumbar facet arthropathy (L4,5 S1)   7. Chronic pain syndrome      Plan of Care   Colleen [name] has a current medication list which includes the following long-term medication(s): albuterol, montelukast, pregabalin, and ventolin hfa.  Pharmacotherapy (Medications Ordered): Meds ordered this encounter  Medications   meloxicam (MOBIC) 7.5 MG tablet    Sig: Take 1 tablet (7.5 mg total) by mouth daily as needed for pain.    Dispense:  30 tablet    Refill:  2   Continue Lyrica and Tramadol as prescribed. No refills needed at this time.  Orders:  Orders Placed This Encounter  Procedures   Lumbar Epidural Injection    Standing Status:   Standing    Number of Occurrences:   9    Standing Expiration Date:   11/01/2021    Scheduling Instructions:     Purpose: Palliative     Indication: Lower extremity pain/Sciatica unspecified side (M54.30).     Side: Midline     Level: TBD     Sedation: Patient's choice.     TIMEFRAME: PRN procedure. (Colleen [name] will call when needed.)    Order Specific Question:   Where will this procedure be performed?    Answer:   [facility]   Follow-up plan:   Return if symptoms worsen or fail to improve.     Status post [name] L4-L5 ESI #1 on 06/11/2020, #2 10/22/20, bilateral C3, C4, C5 cervical facet medial branch nerve block 07/25/2020 with thoracic TPI: only helped for 24 hrs     Recent Visits Date Type Provider Dept  10/22/20 Procedure visit Gillis Santa, MD [facility]  08/16/20 Office Visit Gillis Santa, MD [facility]  Showing recent  visits within past 90 days and meeting all other requirements Today's Visits Date Type Provider Dept  11/01/20 Office Visit Gillis Santa, MD [facility]  Showing today's visits and meeting  all other requirements Future Appointments No visits were found meeting these conditions. Showing future appointments within next 90 days and meeting all other requirements  I discussed the assessment and treatment plan with the patient. The patient was provided an opportunity to ask questions and all were answered. The patient agreed with the plan and demonstrated an understanding of the instructions.  Patient advised to call back or seek an in-person evaluation if the symptoms or condition worsens.  Duration of encounter: 20 minutes.  Note by: Gillis Santa, MD Date: 11/01/2020; Time: 1:28 PM

## 2020-11-08 ENCOUNTER — Other Ambulatory Visit: Payer: Self-pay | Admitting: Family Medicine

## 2020-11-08 DIAGNOSIS — Z1231 Encounter for screening mammogram for malignant neoplasm of breast: Secondary | ICD-10-CM

## 2020-11-14 ENCOUNTER — Telehealth: Payer: Self-pay | Admitting: Student in an Organized Health Care Education/Training Program

## 2020-11-14 ENCOUNTER — Other Ambulatory Visit: Payer: Self-pay | Admitting: *Deleted

## 2020-11-14 NOTE — Telephone Encounter (Signed)
Spoke with patient re: pharmacy change and have added her new pharmacy, [facility].  I did make her aware that she would need to make sure at each visit that the pharmacy is correct for the medication being sent.

## 2020-11-14 NOTE — Telephone Encounter (Signed)
Patient called to change pharmacy to [facility]. Please call patient to confirm change and let her know if there are any meds that can't be filled by [facility]. Thank you.  Also she now has only [employer]

## 2020-11-27 ENCOUNTER — Telehealth: Payer: Self-pay

## 2020-11-27 NOTE — Telephone Encounter (Signed)
Pt states that she wanted to make Dr.Lateef aware that she was seen at [facility] for should pain and was placed on steroids. Also states that all information should be uploaded into her [facility].

## 2020-12-12 ENCOUNTER — Encounter: Payer: Self-pay | Admitting: Student in an Organized Health Care Education/Training Program

## 2020-12-12 ENCOUNTER — Other Ambulatory Visit: Payer: Self-pay

## 2020-12-12 ENCOUNTER — Ambulatory Visit
Attending: Student in an Organized Health Care Education/Training Program | Admitting: Student in an Organized Health Care Education/Training Program

## 2020-12-12 VITALS — BP 134/95 | HR 96 | Temp 98.0°F | Resp 16 | Ht 60.0 in | Wt 140.0 lb

## 2020-12-12 DIAGNOSIS — M5126 Other intervertebral disc displacement, lumbar region: Secondary | ICD-10-CM | POA: Diagnosis not present

## 2020-12-12 DIAGNOSIS — M5416 Radiculopathy, lumbar region: Secondary | ICD-10-CM | POA: Diagnosis not present

## 2020-12-12 DIAGNOSIS — G894 Chronic pain syndrome: Secondary | ICD-10-CM | POA: Diagnosis not present

## 2020-12-12 NOTE — Patient Instructions (Signed)

## 2020-12-12 NOTE — Progress Notes (Signed)
PROVIDER NOTE: Information contained herein reflects review and annotations entered in association with encounter. Interpretation of such information and data should be left to medically-trained personnel. Information provided to patient can be located elsewhere in the medical record under "Patient Instructions". Document created using STT-dictation technology, any transcriptional errors that may result from process are unintentional.    Patient: Colleen [name]  Service Category: E/M  Provider: Gillis Santa, MD  DOB: 07/29/1961  DOS: 12/12/2020  Specialty: Interventional Pain Management  MRN: [id number]  Setting: Ambulatory outpatient  PCP: Remi Haggard, FNP  Type: Established Patient    Referring Provider: Remi Haggard, FNP  Location: Office  Delivery: Face-to-face     HPI  Colleen [name], a 60 y.o. year old female, is here today because of her Lumbar disc herniation [M51.26]. Colleen [name] primary complain today is Back Pain Last encounter: My last encounter with her was on 11/14/2020. Pertinent problems: Colleen [name] has 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, L5/S1); Lumbar facet arthropathy (L4,5 S1); Cervical facet joint syndrome; Osteopenia of multiple sites; Cervical myofascial pain syndrome; and Lumbar radiculopathy on their pertinent problem list. Pain Assessment: Severity of Acute pain is reported as a 8 /10. Location: Back Mid/buttocks/hips down legs to bottom of feet effects all toes bilaterally. Onset: 1 to 4 weeks ago. Quality: Constant,Aching,Burning,Stabbing (toes cold). Timing: Intermittent. Modifying factor(s): "Nothing". Vitals:  height is 5' (1.524 m) and weight is 140 lb (63.5 kg). Her temperature is 98 F (36.7 C). Her blood pressure is 134/95 (abnormal) and her pulse is 96. Her respiration is 16 and oxygen saturation is 95%.   Reason for encounter: worsening of previously known (established) problem    Patient is having increased lower back as well as [name] leg pain in a  dermatomal fashion.  She describes tingling as well as a cold sensation in her [name] leg.  Patient's previous lumbar epidural steroid injection was done with me on 10/22/2020 on the [name] side at L4-L5.  This provided her with pain relief, approximately 70% for greater than 2 weeks.  She states that she twisted incorrectly a couple of weeks ago which is amplified her [name] leg pain.  I checked her pulses and she has intact posterior tibialis and dorsalis pedis pulse bilaterally that I do not think that her [name] extremity which is cooler to touch is a vascular etiology.  For this reason we will consider repeating [name] L4-L5 ESI.  Risks and benefits reviewed and patient would like to proceed.   ROS  Constitutional: Denies any fever or chills Gastrointestinal: No reported hemesis, hematochezia, vomiting, or acute GI distress Musculoskeletal: Low back pain with radiation into [name] lower extremity Neurological: No reported episodes of acute onset apraxia, aphasia, dysarthria, agnosia, amnesia, paralysis, loss of coordination, or loss of consciousness  Medication Review  Albuterol, Cholecalciferol, EPINEPHrine, Fluticasone-Umeclidin-Vilant, albuterol, aspirin EC, cetirizine-pseudoephedrine, cyclobenzaprine, denosumab, hydrOXYzine, meloxicam, montelukast, pregabalin, and traMADol  History Review  Allergy: Colleen [name] is allergic to desvenlafaxine, morphine and related, penicillins, prazosin, tamoxifen, trazodone, clonazepam, codeine, hydroxyzine, paroxetine hcl, and sulfa antibiotics. Drug: Colleen [name]  reports no history of drug use. Alcohol:  reports previous alcohol use. Tobacco:  reports that she has been smoking cigarettes. She has been smoking about 0.50 packs per day. She has never used smokeless tobacco. Social: Colleen [name]  reports that she has been smoking cigarettes. She has been smoking about 0.50 packs per day. She has never used smokeless tobacco. She reports previous alcohol use. She reports  that she does not  use drugs. Medical:  has a past medical history of Anxiety, Asthma, Breast cancer ([place]) (2004), Chronic back pain, COPD (chronic obstructive pulmonary disease) ([place]), Depression, and Personal history of radiation therapy. Surgical: Colleen [name]  has a past surgical history that includes Nephrectomy (Left); Mastectomy (Left); Tubal ligation; Mastectomy; Breast biopsy (Left, 2004); Breast biopsy ([name]); and Breast lumpectomy (Left, 2004). Family: family history includes Breast cancer in her cousin and cousin.  Laboratory Chemistry Profile   Renal Lab Results  Component Value Date   BUN 9 10/16/2020   CREATININE 0.54 10/16/2020   GFRAA >60 03/21/2020   GFRNONAA >60 10/16/2020     Hepatic Lab Results  Component Value Date   AST 14 (L) 10/16/2020   ALT 11 10/16/2020   ALBUMIN 3.8 10/16/2020   ALKPHOS 37 (L) 10/16/2020   LIPASE 24 10/16/2020     Electrolytes Lab Results  Component Value Date   NA 139 10/16/2020   K 4.2 10/16/2020   CL 105 10/16/2020   CALCIUM 8.5 (L) 10/16/2020     Bone No results found for: VD25OH, [id number], EU2353IR4, ER1540GQ6, 25OHVITD1, 25OHVITD2, 25OHVITD3, TESTOFREE, TESTOSTERONE   Inflammation (CRP: Acute Phase) (ESR: Chronic Phase) Lab Results  Component Value Date   LATICACIDVEN 0.78 10/11/2018       Note: Above Lab results reviewed.   Physical Exam  General appearance: Well nourished, well developed, and well hydrated. In no apparent acute distress Mental status: Alert, oriented x 3 (person, place, & time)       Respiratory: No evidence of acute respiratory distress Eyes: PERLA Vitals: BP (!) 134/95   Pulse 96   Temp 98 F (36.7 C)   Resp 16   Ht 5' (1.524 m)   Wt 140 lb (63.5 kg)   SpO2 95%   BMI 27.34 kg/m  BMI: Estimated body mass index is 27.34 kg/m as calculated from the following:   Height as of this encounter: 5' (1.524 m).   Weight as of this encounter: 140 lb (63.5 kg). Ideal: Ideal body weight: 45.5 kg  (100 lb 4.9 oz) Adjusted ideal body weight: 52.7 kg (116 lb 3 oz)  Lumbar Spine Area Exam  Skin & Axial Inspection: No masses, redness, or swelling Alignment: Symmetrical Functional ROM: Pain restricted ROM affecting primarily the [name] Stability: No instability detected Muscle Tone/Strength: Functionally intact. No obvious neuro-muscular anomalies detected. Sensory (Neurological): Dermatomal pain pattern on the [name]  Gait & Posture Assessment  Ambulation: Limited Gait: Antalgic Posture: WNL  Lower Extremity Exam    Side: [name] lower extremity  Side: Left lower extremity  Stability: No instability observed          Stability: No instability observed          Skin & Extremity Inspection: Skin color, temperature, and hair growth are WNL. No peripheral edema or cyanosis. No masses, redness, swelling, asymmetry, or associated skin lesions. No contractures.  Skin & Extremity Inspection: Skin color, temperature, and hair growth are WNL. No peripheral edema or cyanosis. No masses, redness, swelling, asymmetry, or associated skin lesions. No contractures.  Functional ROM: Pain restricted ROM for hip and knee joints Limited SLR (straight leg raise)  Functional ROM: Unrestricted ROM                  Muscle Tone/Strength: Functionally intact. No obvious neuro-muscular anomalies detected.  Muscle Tone/Strength: Functionally intact. No obvious neuro-muscular anomalies detected.  Sensory (Neurological): Neurogenic pain pattern        Sensory (Neurological): Unimpaired  DTR: Patellar: deferred today Achilles: deferred today Plantar: deferred today  DTR: Patellar: deferred today Achilles: deferred today Plantar: deferred today  Palpation: No palpable anomalies  Palpation: No palpable anomalies    Assessment   Status Diagnosis  Persistent Having a Flare-up Controlled 1. 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, L5/S1)   2. Lumbar radiculopathy   3. Chronic pain syndrome       Plan of Care    Colleen [name] has a current medication list which includes the following long-term medication(s): albuterol, montelukast, pregabalin, and ventolin hfa.  Orders:  Orders Placed This Encounter  Procedures  . Lumbar Epidural Injection    Standing Status:   Future    Standing Expiration Date:   01/09/2021    Scheduling Instructions:     Procedure: Interlaminar Lumbar Epidural Steroid injection (LESI)  [name] L4/5       Laterality: Midline     Sedation: Patient's choice.     Timeframe: ASAA    Order Specific Question:   Where will this procedure be performed?    Answer:   [facility]   Follow-up plan:   Return in about 1 week (around 12/19/2020) for [name] L4/5 ESI .     Status post [name] L4-L5 ESI #1 on 06/11/2020, #2 10/22/20, bilateral C3, C4, C5 cervical facet medial branch nerve block 07/25/2020 with thoracic TPI: only helped for 24 hrs      Recent Visits Date Type Provider Dept  11/01/20 Office Visit Gillis Santa, MD [facility]  10/22/20 Procedure visit Gillis Santa, MD [facility]  Showing recent visits within past 90 days and meeting all other requirements Today's Visits Date Type Provider Dept  12/12/20 Office Visit Gillis Santa, MD [facility]  Showing today's visits and meeting all other requirements Future Appointments Date Type Provider Dept  12/19/20 Appointment Gillis Santa, MD [facility]  Showing future appointments within next 90 days and meeting all other requirements  I discussed the assessment and treatment plan with the patient. The patient was provided an opportunity to ask questions and all were answered. The patient agreed with the plan and demonstrated an understanding of the instructions.  Patient advised to call back or seek an in-person evaluation if the symptoms or condition worsens.  Duration of encounter: 30 minutes.  Note by: Gillis Santa, MD Date: 12/12/2020; Time: 3:03 PM

## 2020-12-12 NOTE — Progress Notes (Signed)
Safety precautions to be maintained throughout the outpatient stay will include: orient to surroundings, keep bed in low position, maintain call bell within reach at all times, provide assistance with transfer out of bed and ambulation.  

## 2020-12-19 ENCOUNTER — Ambulatory Visit (HOSPITAL_BASED_OUTPATIENT_CLINIC_OR_DEPARTMENT_OTHER): Admitting: Student in an Organized Health Care Education/Training Program

## 2020-12-19 ENCOUNTER — Ambulatory Visit
Admission: RE | Admit: 2020-12-19 | Discharge: 2020-12-19 | Disposition: A | Discharge status: Home / Self Care | Source: Ambulatory Visit | Attending: Student in an Organized Health Care Education/Training Program | Admitting: Student in an Organized Health Care Education/Training Program

## 2020-12-19 ENCOUNTER — Encounter: Payer: Self-pay | Admitting: Student in an Organized Health Care Education/Training Program

## 2020-12-19 ENCOUNTER — Other Ambulatory Visit: Payer: Self-pay

## 2020-12-19 VITALS — BP 125/86 | HR 90 | Temp 97.3°F | Resp 16 | Ht 60.0 in | Wt 145.0 lb

## 2020-12-19 DIAGNOSIS — M5126 Other intervertebral disc displacement, lumbar region: Secondary | ICD-10-CM | POA: Insufficient documentation

## 2020-12-19 DIAGNOSIS — M5416 Radiculopathy, lumbar region: Secondary | ICD-10-CM | POA: Insufficient documentation

## 2020-12-19 DIAGNOSIS — G894 Chronic pain syndrome: Secondary | ICD-10-CM

## 2020-12-19 MED ORDER — ROPIVACAINE HCL 2 MG/ML IJ SOLN
2.0000 mL | Freq: Once | INTRAMUSCULAR | Status: AC
  Administered 2020-12-19: 2 mL via EPIDURAL
  Filled 2020-12-19: qty 10

## 2020-12-19 MED ORDER — LIDOCAINE HCL 2 % IJ SOLN
20.0000 mL | Freq: Once | INTRAMUSCULAR | Status: AC
  Administered 2020-12-19: 400 mg
  Filled 2020-12-19: qty 20

## 2020-12-19 MED ORDER — IOHEXOL 180 MG/ML  SOLN
10.0000 mL | Freq: Once | INTRAMUSCULAR | Status: AC
  Administered 2020-12-19: 10 mL via EPIDURAL

## 2020-12-19 MED ORDER — DEXAMETHASONE SODIUM PHOSPHATE 10 MG/ML IJ SOLN
10.0000 mg | Freq: Once | INTRAMUSCULAR | Status: AC
  Administered 2020-12-19: 10 mg
  Filled 2020-12-19: qty 1

## 2020-12-19 MED ORDER — SODIUM CHLORIDE 0.9% FLUSH
2.0000 mL | Freq: Once | INTRAVENOUS | Status: AC
  Administered 2020-12-19: 2 mL

## 2020-12-19 NOTE — Progress Notes (Signed)
PROVIDER NOTE: Information contained herein reflects review and annotations entered in association with encounter. Interpretation of such information and data should be left to medically-trained personnel. Information provided to patient can be located elsewhere in the medical record under "Patient Instructions". Document created using STT-dictation technology, any transcriptional errors that may result from process are unintentional.    Patient: Colleen [name]  Service Category: Procedure  Provider: Gillis Santa, MD  DOB: [birth year]  DOS: 12/19/2020  Location: [facility]  MRN: [id number]  Setting: Ambulatory - outpatient  Referring Provider: Remi Haggard, FNP  Type: Established Patient  Specialty: Interventional Pain Management  PCP: Remi Haggard, FNP   Primary Reason for Visit: Interventional Pain Management Treatment. CC: Back Pain (low) and Leg Pain (Bilateral anteriorly and laterally)  Procedure:          Anesthesia, Analgesia, Anxiolysis:  Type: Therapeutic Inter-Laminar Epidural Steroid Injection  #3 (#1 done 06/11/2020, #2 10/22/20) Region: Lumbar Level: L4-5 Level. Laterality: [name]         Type: Local Anesthesia   Local Anesthetic: Lidocaine 1-2%  Position: Prone with head of the table was raised to facilitate breathing.   Indications: 1. 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, L5/S1)   2. Lumbar radiculopathy   3. Chronic pain syndrome    Pain Score: Pre-procedure: 8 /10 Post-procedure: 5 /10   Pre-op Assessment:  Colleen [name] is a 60 y.o. (year old), female patient, seen today for interventional treatment. She  has a past surgical history that includes Nephrectomy (Left); Mastectomy (Left); Tubal ligation; Mastectomy; Breast biopsy (Left, 2004); Breast biopsy ([name]); and Breast lumpectomy (Left, 2004). Colleen [name] has a current medication list which includes the following prescription(s): albuterol, aspirin ec, cetirizine-pseudoephedrine, cholecalciferol,  cyclobenzaprine, denosumab, epinephrine, trelegy ellipta, hydroxyzine, meloxicam, montelukast, pregabalin, tramadol, and ventolin hfa. Her primarily concern today is the Back Pain (low) and Leg Pain (Bilateral anteriorly and laterally)  Initial Vital Signs:  Pulse/HCG Rate: 90  Temp: (!) 97.3 F (36.3 C) Resp: 18 BP: 125/83 SpO2: 100 %  BMI: Estimated body mass index is 28.32 kg/m as calculated from the following:   Height as of this encounter: 5' (1.524 m).   Weight as of this encounter: 145 lb (65.8 kg).  Risk Assessment: Allergies: Reviewed. She is allergic to desvenlafaxine, morphine and related, penicillins, prazosin, tamoxifen, trazodone, clonazepam, codeine, hydroxyzine, paroxetine hcl, and sulfa antibiotics.  Allergy Precautions: None required Coagulopathies: Reviewed. None identified.  Blood-thinner therapy: None at this time Active Infection(s): Reviewed. None identified. Colleen [name] is afebrile  Site Confirmation: Colleen [name] was asked to confirm the procedure and laterality before marking the site Procedure checklist: Completed Consent: Before the procedure and under the influence of no sedative(s), amnesic(s), or anxiolytics, the patient was informed of the treatment options, risks and possible complications. To fulfill our ethical and legal obligations, as recommended by the American Medical Association's Code of Ethics, I have informed the patient of my clinical impression; the nature and purpose of the treatment or procedure; the risks, benefits, and possible complications of the intervention; the alternatives, including doing nothing; the risk(s) and benefit(s) of the alternative treatment(s) or procedure(s); and the risk(s) and benefit(s) of doing nothing. The patient was provided information about the general risks and possible complications associated with the procedure. These may include, but are not limited to: failure to achieve desired goals, infection, bleeding, organ  or nerve damage, allergic reactions, paralysis, and death. In addition, the patient was informed of those risks and complications associated to [facility]  procedures, such as failure to decrease pain; infection (i.e.: Meningitis, epidural or intraspinal abscess); bleeding (i.e.: epidural hematoma, subarachnoid hemorrhage, or any other type of intraspinal or peri-dural bleeding); organ or nerve damage (i.e.: Any type of peripheral nerve, nerve root, or spinal cord injury) with subsequent damage to sensory, motor, and/or autonomic systems, resulting in permanent pain, numbness, and/or weakness of one or several areas of the body; allergic reactions; (i.e.: anaphylactic reaction); and/or death. Furthermore, the patient was informed of those risks and complications associated with the medications. These include, but are not limited to: allergic reactions (i.e.: anaphylactic or anaphylactoid reaction(s)); adrenal axis suppression; blood sugar elevation that in diabetics may result in ketoacidosis or comma; water retention that in patients with history of congestive heart failure may result in shortness of breath, pulmonary edema, and decompensation with resultant heart failure; weight gain; swelling or edema; medication-induced neural toxicity; particulate matter embolism and blood vessel occlusion with resultant organ, and/or nervous system infarction; and/or aseptic necrosis of one or more joints. Finally, the patient was informed that Medicine is not an exact science; therefore, there is also the possibility of unforeseen or unpredictable risks and/or possible complications that may result in a catastrophic outcome. The patient indicated having understood very clearly. We have given the patient no guarantees and we have made no promises. Enough time was given to the patient to ask questions, all of which were answered to the patient's satisfaction. Colleen [name] has indicated that she wanted to continue with the  procedure. Attestation: I, the ordering provider, attest that I have discussed with the patient the benefits, risks, side-effects, alternatives, likelihood of achieving goals, and potential problems during recovery for the procedure that I have provided informed consent. Date  Time: 12/19/2020  8:11 AM  Pre-Procedure Preparation:  Monitoring: As per clinic protocol. Respiration, ETCO2, SpO2, BP, heart rate and rhythm monitor placed and checked for adequate function Safety Precautions: Patient was assessed for positional comfort and pressure points before starting the procedure. Time-out: I initiated and conducted the "Time-out" before starting the procedure, as per protocol. The patient was asked to participate by confirming the accuracy of the "Time Out" information. Verification of the correct person, site, and procedure were performed and confirmed by me, the nursing staff, and the patient. "Time-out" conducted as per Joint Commission's Universal Protocol (UP.01.01.01). Time: 9678  Description of Procedure:          Target Area: The interlaminar space, initially targeting the lower laminar border of the superior vertebral body. Approach: Paramedial approach. Area Prepped: Entire Posterior Lumbar Region DuraPrep (Iodine Povacrylex [0.7% available iodine] and Isopropyl Alcohol, 74% w/w) Safety Precautions: Aspiration looking for blood return was conducted prior to all injections. At no point did we inject any substances, as a needle was being advanced. No attempts were made at seeking any paresthesias. Safe injection practices and needle disposal techniques used. Medications properly checked for expiration dates. SDV (single dose vial) medications used. Description of the Procedure: Protocol guidelines were followed. The procedure needle was introduced through the skin, ipsilateral to the reported pain, and advanced to the target area. Bone was contacted and the needle walked caudad, until the lamina  was cleared. The epidural space was identified using "loss-of-resistance technique" with 2-3 ml of PF-NaCl (0.9% NSS), in a 5cc LOR glass syringe.  Vitals:   12/19/20 0817 12/19/20 0850 12/19/20 0900  BP: 125/83 (!) 137/94 125/86  Pulse: 90 93 90  Resp: 18 17 16   Temp: (!) 97.3 F (36.3 C)  TempSrc: Temporal    SpO2: 100% 99% 99%  Weight: 145 lb (65.8 kg)    Height: 5' (1.524 m)      Start Time: 0852 hrs. End Time: 0857 hrs.  Materials:  Needle(s) Type: Epidural needle Gauge: 22G Length: 3.5-in Medication(s): Please see orders for medications and dosing details. 7 cc solution made of 3 cc of preservative-free saline, 3 cc of 0.2% ropivacaine, 1 cc of Decadron 10 mg/cc.  Imaging Guidance (Spinal):          Type of Imaging Technique: Fluoroscopy Guidance (Spinal) Indication(s): Assistance in needle guidance and placement for procedures requiring needle placement in or near specific anatomical locations not easily accessible without such assistance. Exposure Time: Please see nurses notes. Contrast: Before injecting any contrast, we confirmed that the patient did not have an allergy to iodine, shellfish, or radiological contrast. Once satisfactory needle placement was completed at the desired level, radiological contrast was injected. Contrast injected under live fluoroscopy. No contrast complications. See chart for type and volume of contrast used. Fluoroscopic Guidance: I was personally present during the use of fluoroscopy. "Tunnel Vision Technique" used to obtain the best possible view of the target area. Parallax error corrected before commencing the procedure. "Direction-depth-direction" technique used to introduce the needle under continuous pulsed fluoroscopy. Once target was reached, antero-posterior, oblique, and lateral fluoroscopic projection used confirm needle placement in all planes. Images permanently stored in EMR. Interpretation: I personally interpreted the imaging  intraoperatively. Adequate needle placement confirmed in multiple planes. Appropriate spread of contrast into desired area was observed. No evidence of afferent or efferent intravascular uptake. No intrathecal or subarachnoid spread observed. Permanent images saved into the patient's record.  Antibiotic Prophylaxis:   Anti-infectives (From admission, onward)   None     Indication(s): None identified  Post-operative Assessment:  Post-procedure Vital Signs:  Pulse/HCG Rate: 90  Temp: (!) 97.3 F (36.3 C) Resp: 16 BP: 125/86 SpO2: 99 %  EBL: None  Complications: No immediate post-treatment complications observed by team, or reported by patient.  Note: The patient tolerated the entire procedure well. A repeat set of vitals were taken after the procedure and the patient was kept under observation following institutional policy, for this type of procedure. Post-procedural neurological assessment was performed, showing return to baseline, prior to discharge. The patient was provided with post-procedure discharge instructions, including a section on how to identify potential problems. Should any problems arise concerning this procedure, the patient was given instructions to immediately contact us, at any time, without hesitation. In any case, we plan to contact the patient by telephone for a follow-up status report regarding this interventional procedure.  Comments:  No additional relevant information.  Plan of Care  Orders:  Orders Placed This Encounter  Procedures  . [facility] C-ARM 1-60 MIN NO REPORT    Intraoperative interpretation by procedural physician at [place].    Standing Status:   Standing    Number of Occurrences:   1    Order Specific Question:   Reason for exam:    Answer:   Assistance in needle guidance and placement for procedures requiring needle placement in or near specific anatomical locations not easily accessible without such assistance.    Medications ordered for procedure: Meds ordered this encounter  Medications  . iohexol (OMNIPAQUE) 180 MG/ML injection 10 mL    Must be Myelogram-compatible. If not available, you may substitute with a water-soluble, non-ionic, hypoallergenic, myelogram-compatible radiological contrast medium.  Marland Kitchen lidocaine (XYLOCAINE) 2 % (  with pres) injection 400 mg  . ropivacaine (PF) 2 mg/mL (0.2%) (NAROPIN) injection 2 mL  . sodium chloride flush (NS) 0.9 % injection 2 mL  . dexamethasone (DECADRON) injection 10 mg   Medications administered: We administered iohexol, lidocaine, ropivacaine (PF) 2 mg/mL (0.2%), sodium chloride flush, and dexamethasone.  See the medical record for exact dosing, route, and time of administration.  Follow-up plan:   Return in about 4 weeks (around 01/16/2021) for Post Procedure Evaluation, virtual.    Recent Visits Date Type Provider Dept  12/12/20 Office Visit Gillis Santa, MD [facility]  11/01/20 Office Visit Gillis Santa, MD [facility]  10/22/20 Procedure visit Gillis Santa, MD [facility]  Showing recent visits within past 90 days and meeting all other requirements Today's Visits Date Type Provider Dept  12/19/20 Procedure visit Gillis Santa, MD [facility]  Showing today's visits and meeting all other requirements Future Appointments Date Type Provider Dept  01/17/21 Appointment Gillis Santa, MD [facility]  Showing future appointments within next 90 days and meeting all other requirements  Disposition: Discharge home  Discharge (Date  Time): 12/19/2020; 0903 hrs.   Primary Care Physician: Remi Haggard, FNP Location: [facility] Note by: Gillis Santa, MD Date: 12/19/2020; Time: 9:21 AM  Disclaimer:  Medicine is not an exact science. The only guarantee in medicine is that nothing is guaranteed. It is important to note that the decision to proceed with this  intervention was based on the information collected from the patient. The Data and conclusions were drawn from the patient's questionnaire, the interview, and the physical examination. Because the information was provided in large part by the patient, it cannot be guaranteed that it has not been purposely or unconsciously manipulated. Every effort has been made to obtain as much relevant data as possible for this evaluation. It is important to note that the conclusions that lead to this procedure are derived in large part from the available data. Always take into account that the treatment will also be dependent on availability of resources and existing treatment guidelines, considered by other Pain Management Practitioners as being common knowledge and practice, at the time of the intervention. For Medico-Legal purposes, it is also important to point out that variation in procedural techniques and pharmacological choices are the acceptable norm. The indications, contraindications, technique, and results of the above procedure should only be interpreted and judged by a Board-Certified Interventional Pain Specialist with extensive familiarity and expertise in the same exact procedure and technique.

## 2020-12-20 ENCOUNTER — Telehealth: Payer: Self-pay | Admitting: *Deleted

## 2020-12-20 NOTE — Telephone Encounter (Signed)
Called patient re; procedure on yesterday.  States she is a little tender at injection site and she had a slight headache on yesterday when she got home.  I told her that tenderness could be normal, she did apply ice to the site.  The headache has subsided.  Told her that if it is better, I don't think it would be related to procedure.  However, if the headache comes back or becomes worse to let us know.  Patient verbalizes u/o information.

## 2021-01-01 ENCOUNTER — Ambulatory Visit: Admitting: Student in an Organized Health Care Education/Training Program

## 2021-01-15 ENCOUNTER — Telehealth: Payer: Self-pay

## 2021-01-15 NOTE — Telephone Encounter (Signed)
LM for patient to call office to go over pre virtual appointment questions.  

## 2021-01-17 ENCOUNTER — Ambulatory Visit
Attending: Student in an Organized Health Care Education/Training Program | Admitting: Student in an Organized Health Care Education/Training Program

## 2021-01-17 ENCOUNTER — Encounter: Payer: Self-pay | Admitting: Student in an Organized Health Care Education/Training Program

## 2021-01-17 ENCOUNTER — Other Ambulatory Visit: Payer: Self-pay

## 2021-01-17 DIAGNOSIS — M47812 Spondylosis without myelopathy or radiculopathy, cervical region: Secondary | ICD-10-CM | POA: Diagnosis not present

## 2021-01-17 DIAGNOSIS — F331 Major depressive disorder, recurrent, moderate: Secondary | ICD-10-CM

## 2021-01-17 DIAGNOSIS — G894 Chronic pain syndrome: Secondary | ICD-10-CM

## 2021-01-17 DIAGNOSIS — M47816 Spondylosis without myelopathy or radiculopathy, lumbar region: Secondary | ICD-10-CM

## 2021-01-17 DIAGNOSIS — M7918 Myalgia, other site: Secondary | ICD-10-CM

## 2021-01-17 DIAGNOSIS — M5126 Other intervertebral disc displacement, lumbar region: Secondary | ICD-10-CM | POA: Diagnosis not present

## 2021-01-17 DIAGNOSIS — M5416 Radiculopathy, lumbar region: Secondary | ICD-10-CM | POA: Diagnosis not present

## 2021-01-17 MED ORDER — DULOXETINE HCL 20 MG PO CPEP
ORAL_CAPSULE | ORAL | 0 refills | Status: DC
Start: 2021-01-17 — End: 2021-01-17

## 2021-01-17 MED ORDER — DULOXETINE HCL 20 MG PO CPEP: ORAL_CAPSULE | ORAL | 0 refills | Status: DC

## 2021-01-17 NOTE — Progress Notes (Signed)
Patient: Colleen Colleen [name]  Service Category: E/M  Provider: Gillis Santa, MD  DOB: 05/06/1961  DOS: 01/17/2021  Location: Office  MRN: [id number]  Setting: Ambulatory outpatient  Referring Provider: Remi Haggard, FNP  Type: Established Patient  Specialty: Interventional Pain Management  PCP: Remi Haggard, FNP  Location: Home  Delivery: TeleHealth     Virtual Encounter - Pain Management PROVIDER NOTE: Information contained herein reflects review and annotations entered in association with encounter. Interpretation of such information and data should be left to medically-trained personnel. Information provided to patient can be located elsewhere in the medical record under "Patient Instructions". Document created using STT-dictation technology, any transcriptional errors that may result from process are unintentional.    Contact & Pharmacy Preferred: [phone] Home: [phone] (home) Mobile: [phone] (mobile) E-mail: [email or web address] Colleen [name], [place] [address] Phone: [phone] Fax: [phone]  [facility] [place], [place] [address] [place] [address] Phone: [phone] Fax: [phone]   Pre-screening  Colleen Colleen [name] offered "in-person" vs "virtual" encounter. She indicated preferring virtual for this encounter.   Reason COVID-19*  Social distancing based on CDC and AMA recommendations.   I contacted Colleen Colleen [name] on 01/17/2021 via video conference.      I clearly identified myself as Gillis Santa, MD. I verified that I was speaking with the correct person using two identifiers (Name: Colleen Colleen [name], and date of birth: [birth year]).  Consent I sought verbal advanced consent from Colleen Colleen [name] for virtual visit interactions. I informed Colleen Colleen [name] of possible security and privacy concerns, risks, and limitations associated with  providing "not-in-person" medical evaluation and management services. I also informed Colleen Colleen [name] of the availability of "in-person" appointments. Finally, I informed her that there would be a charge for the virtual visit and that she could be  personally, fully or partially, financially responsible for it. Colleen Colleen [name] expressed understanding and agreed to proceed.   Historic Elements   Colleen Colleen [name] is a 60 y.o. year old, female patient evaluated today after our last contact on 12/19/2020. Colleen Colleen [name]  has a past medical history of Anxiety, Asthma, Breast cancer ([place]) (2004), Chronic back pain, COPD (chronic obstructive pulmonary disease) ([place]), Depression, and Personal history of radiation therapy. She also  has a past surgical history that includes Nephrectomy (Left); Mastectomy (Left); Tubal ligation; Mastectomy; Breast biopsy (Left, 2004); Breast biopsy (Colleen [name]); and Breast lumpectomy (Left, 2004). Colleen Colleen [name] has a current medication list which includes the following prescription(s): albuterol, aspirin ec, cetirizine-pseudoephedrine, cholecalciferol, cyclobenzaprine, denosumab, epinephrine, trelegy ellipta, hydroxyzine, montelukast, pregabalin, ventolin hfa, and duloxetine. She  reports that she has been smoking cigarettes. She has been smoking about 0.50 packs per day. She has never used smokeless tobacco. She reports previous alcohol use. She reports that she does not use drugs. Colleen Colleen [name] is allergic to desvenlafaxine, morphine and related, penicillins, prazosin, tamoxifen, trazodone, clonazepam, codeine, hydroxyzine, paroxetine hcl, and sulfa antibiotics.   HPI  Today, she is being contacted for a post-procedure assessment.  Post-Procedure Evaluation  Procedure (12/19/2020): Colleen [name] L4-L5 ESI #3  Sedation: Please see nurses note.  Effectiveness during initial hour after procedure(Ultra-Short Term Relief): 100 %  Local anesthetic used: Long-acting (4-6 hours) Effectiveness: Defined as any analgesic  benefit obtained secondary to the administration of local anesthetics. This carries significant diagnostic value as to the etiological location, or anatomical origin, of the pain. Duration of benefit  is expected to coincide with the duration of the local anesthetic used.  Effectiveness during initial 4-6 hours after procedure(Short-Term Relief): 100 %   Long-term benefit: Defined as any relief past the pharmacologic duration of the local anesthetics.  Effectiveness past the initial 6 hours after procedure(Long-Term Relief): 35 %   Current benefits: Defined as benefit that persist at this time.   Analgesia:  Back to baseline Function: Back to baseline ROM: Back to baseline   Patient had limited benefit from her Colleen [name] L4-L5 epidural steroid injection #3 that was done in early February.  We again reviewed her lumbar MRI which does not show significant compressive pathology or significant arthritis.  Patient has diffuse spinal pain.  This could also be related to musculoskeletal, myofascial pain syndrome or even fibromyalgia.  She is already on Lyrica 75 mg twice a day.  I recommend that she add Cymbalta at 20 mg daily and then increase to 40 mg daily.  Patient states that she would also like a referral to psychiatrist for the management of her depression and anxiety which is very reasonable.  Referral will be placed below.  Patient follow-up with me in 8 weeks to review how she is doing with Cymbalta in addition.  She was instructed to continue Lyrica as prescribed.  She states that she had to stop tramadol as it was resulting in side effects.  She states that she is in the process of having her genetic results finalized which should help elucidate what medication she can metabolize.  I instructed her to provide [place] with these results when she receives them.  UDS:  Summary  Date Value Ref Range Status  06/05/2020 Note  Final    Comment:     ==================================================================== Compliance Drug Analysis, Ur ==================================================================== Test                             Result       Flag       Units  Drug Present and Declared for Prescription Verification   Ephedrine/Pseudoephedrine      PRESENT      EXPECTED   Phenylpropanolamine            PRESENT      EXPECTED    Source of ephedrine/pseudoephedrine is most commonly pseudoephedrine    in over-the-counter or prescription cold and allergy medications.    Phenylpropanolamine is an expected metabolite of    ephedrine/pseudoephedrine.    Pregabalin                     PRESENT      EXPECTED  Drug Present not Declared for Prescription Verification   Acetaminophen                  PRESENT      UNEXPECTED  Drug Absent but Declared for Prescription Verification   Tramadol                       Not Detected UNEXPECTED ng/mg creat   Cyclobenzaprine                Not Detected UNEXPECTED   Salicylate                     Not Detected UNEXPECTED    Aspirin, as indicated in the declared medication list, is not always    detected even when used as directed.  ====================================================================  Test                      Result    Flag   Units      Ref Range   Creatinine              22               mg/dL      >=20 ==================================================================== Declared Medications:  The flagging and interpretation on this report are based on the  following declared medications.  Unexpected results may arise from  inaccuracies in the declared medications.   **Note: The testing scope of this panel includes these medications:   Cyclobenzaprine (Flexeril)  Pregabalin (Lyrica)  Pseudoephedrine (Zyrtec-D)  Tramadol (Ultram)   **Note: The testing scope of this panel does not include small to  moderate amounts of these reported medications:   Aspirin    **Note: The testing scope of this panel does not include the  following reported medications:   Albuterol  Cetirizine (Zyrtec-D)  Denosumab (Prolia)  Fluticasone (Trelegy)  Umeclidinium (Trelegy)  Vilanterol (Trelegy) ==================================================================== For clinical consultation, please call [phone]. ====================================================================     Laboratory Chemistry Profile   Renal Lab Results  Component Value Date   BUN 9 10/16/2020   CREATININE 0.54 10/16/2020   GFRAA >60 03/21/2020   GFRNONAA >60 10/16/2020     Hepatic Lab Results  Component Value Date   AST 14 (L) 10/16/2020   ALT 11 10/16/2020   ALBUMIN 3.8 10/16/2020   ALKPHOS 37 (L) 10/16/2020   LIPASE 24 10/16/2020     Electrolytes Lab Results  Component Value Date   NA 139 10/16/2020   K 4.2 10/16/2020   CL 105 10/16/2020   CALCIUM 8.5 (L) 10/16/2020     Bone No results found for: VD25OH, XQ119ER7EYC, XK4818HU3, JS9702OV7, 25OHVITD1, 25OHVITD2, 25OHVITD3, TESTOFREE, TESTOSTERONE   Inflammation (CRP: Acute Phase) (ESR: Chronic Phase) Lab Results  Component Value Date   LATICACIDVEN 0.78 10/11/2018       Note: Above Lab results reviewed.   Assessment  The primary encounter diagnosis was 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, L5/S1). Diagnoses of Lumbar radiculopathy, Cervical facet joint syndrome (C3/4, C4/5), Myofascial pain syndrome, Lumbar facet arthropathy (L4,5 S1), Cervical myofascial pain syndrome, Chronic pain syndrome, and Moderate episode of recurrent major depressive disorder ([place]) were also pertinent to this visit.  Plan of Care   Colleen Colleen [name] has a current medication list which includes the following long-term medication(s): albuterol, montelukast, pregabalin, ventolin hfa, and duloxetine.  Pharmacotherapy (Medications Ordered): Meds ordered this encounter  Medications  . DISCONTD: DULoxetine (CYMBALTA) 20 MG capsule     Sig: Take 1 capsule (20 mg total) by mouth daily for 20 days, THEN 2 capsules (40 mg total) daily.    Dispense:  100 capsule    Refill:  0  . DULoxetine (CYMBALTA) 20 MG capsule    Sig: Take 1 capsule (20 mg total) by mouth daily for 20 days, THEN 2 capsules (40 mg total) daily.    Dispense:  100 capsule    Refill:  0   Orders:  Orders Placed This Encounter  Procedures  . Ambulatory referral to Psychiatry    Referral Priority:   Routine    Referral Type:   Psychiatric    Referral Reason:   Specialty Services Required    Referred to Provider:   Ursula Alert, MD    Requested Specialty:   Psychiatry    Number of  Visits Requested:   1   Follow-up plan:   Return in about 8 weeks (around 03/14/2021) for Medication Management, in person.     Status post Colleen [name] L4-L5 ESI #1 on 06/11/2020, #2 10/22/20, bilateral C3, C4, C5 cervical facet medial branch nerve block 07/25/2020 with thoracic TPI: only helped for 24 hrs       Recent Visits Date Type Provider Dept  12/19/20 Procedure visit Gillis Santa, MD [facility]  12/12/20 Office Visit Gillis Santa, MD [facility]  11/01/20 Office Visit Gillis Santa, MD [facility]  10/22/20 Procedure visit Gillis Santa, MD [facility]  Showing recent visits within past 90 days and meeting all other requirements Today's Visits Date Type Provider Dept  01/17/21 Telemedicine Gillis Santa, MD [facility]  Showing today's visits and meeting all other requirements Future Appointments No visits were found meeting these conditions. Showing future appointments within next 90 days and meeting all other requirements  I discussed the assessment and treatment plan with the patient. The patient was provided an opportunity to ask questions and all were answered. The patient agreed with the plan and demonstrated an understanding of the instructions.  Patient advised to call back or seek an in-person evaluation if the  symptoms or condition worsens.  Duration of encounter: 20 minutes.  Note by: Gillis Santa, MD Date: 01/17/2021; Time: 1:45 PM

## 2021-01-20 ENCOUNTER — Emergency Department

## 2021-01-20 ENCOUNTER — Emergency Department
Admission: EM | Admit: 2021-01-20 | Discharge: 2021-01-20 | Disposition: A | Discharge status: Home / Self Care | Attending: Emergency Medicine | Admitting: Emergency Medicine

## 2021-01-20 ENCOUNTER — Other Ambulatory Visit: Payer: Self-pay

## 2021-01-20 DIAGNOSIS — Z7982 Long term (current) use of aspirin: Secondary | ICD-10-CM | POA: Insufficient documentation

## 2021-01-20 DIAGNOSIS — J45909 Unspecified asthma, uncomplicated: Secondary | ICD-10-CM | POA: Insufficient documentation

## 2021-01-20 DIAGNOSIS — J441 Chronic obstructive pulmonary disease with (acute) exacerbation: Secondary | ICD-10-CM

## 2021-01-20 DIAGNOSIS — R0789 Other chest pain: Secondary | ICD-10-CM

## 2021-01-20 DIAGNOSIS — M542 Cervicalgia: Secondary | ICD-10-CM | POA: Diagnosis not present

## 2021-01-20 DIAGNOSIS — J449 Chronic obstructive pulmonary disease, unspecified: Secondary | ICD-10-CM | POA: Insufficient documentation

## 2021-01-20 DIAGNOSIS — F419 Anxiety disorder, unspecified: Secondary | ICD-10-CM | POA: Insufficient documentation

## 2021-01-20 DIAGNOSIS — Z880 Allergy status to penicillin: Secondary | ICD-10-CM | POA: Diagnosis not present

## 2021-01-20 DIAGNOSIS — Z79899 Other long term (current) drug therapy: Secondary | ICD-10-CM | POA: Diagnosis not present

## 2021-01-20 DIAGNOSIS — F17210 Nicotine dependence, cigarettes, uncomplicated: Secondary | ICD-10-CM | POA: Insufficient documentation

## 2021-01-20 DIAGNOSIS — Z853 Personal history of malignant neoplasm of breast: Secondary | ICD-10-CM | POA: Insufficient documentation

## 2021-01-20 LAB — BASIC METABOLIC PANEL
Anion gap: 8 (ref 5–15)
BUN: 8 mg/dL (ref 6–20)
CO2: 25 mmol/L (ref 22–32)
Calcium: 8.8 mg/dL — ABNORMAL LOW (ref 8.9–10.3)
Chloride: 103 mmol/L (ref 98–111)
Creatinine, Ser: 0.63 mg/dL (ref 0.44–1.00)
GFR, Estimated: >60 mL/min (ref >60)
Glucose, Bld: 111 mg/dL — ABNORMAL HIGH (ref 70–99)
Potassium: 4 mmol/L (ref 3.5–5.1)
Sodium: 136 mmol/L (ref 135–145)

## 2021-01-20 LAB — CBC WITH DIFFERENTIAL/PLATELET
Abs Immature Granulocytes: 0.02 10*3/uL (ref 0.00–0.07)
Basophils Absolute: 0.1 10*3/uL (ref 0.0–0.1)
Basophils Relative: 1 %
Eosinophils Absolute: 0.2 10*3/uL (ref 0.0–0.5)
Eosinophils Relative: 2 %
HCT: 43.3 % (ref 36.0–46.0)
Hemoglobin: 14.2 g/dL (ref 12.0–15.0)
Immature Granulocytes: 0 %
Lymphocytes Relative: 23 %
Lymphs Abs: 1.9 10*3/uL (ref 0.7–4.0)
MCH: 29.9 pg (ref 26.0–34.0)
MCHC: 32.8 g/dL (ref 30.0–36.0)
MCV: 91.2 fL (ref 80.0–100.0)
Monocytes Absolute: 0.6 10*3/uL (ref 0.1–1.0)
Monocytes Relative: 7 %
Neutro Abs: 5.6 10*3/uL (ref 1.7–7.7)
Neutrophils Relative %: 67 %
Platelets: 263 10*3/uL (ref 150–400)
RBC: 4.75 MIL/uL (ref 3.87–5.11)
RDW: 14.1 % (ref 11.5–15.5)
WBC: 8.3 10*3/uL (ref 4.0–10.5)
nRBC: 0 % (ref 0.0–0.2)

## 2021-01-20 LAB — TROPONIN I (HIGH SENSITIVITY)
Troponin I (High Sensitivity): 4 ng/L (ref <18)
Troponin I (High Sensitivity): 4 ng/L (ref <18)

## 2021-01-20 IMAGING — DX DG CHEST 1V PORT
1 series · 1 of 1 positions shown · non-contrast
Comparison: Portable chest [DATE] and earlier.

CLINICAL DATA: 59-year-old female with shortness of breath, back
pain with no known injury. Smoker.

EXAM:
PORTABLE CHEST 1 VIEW

[chest ap]
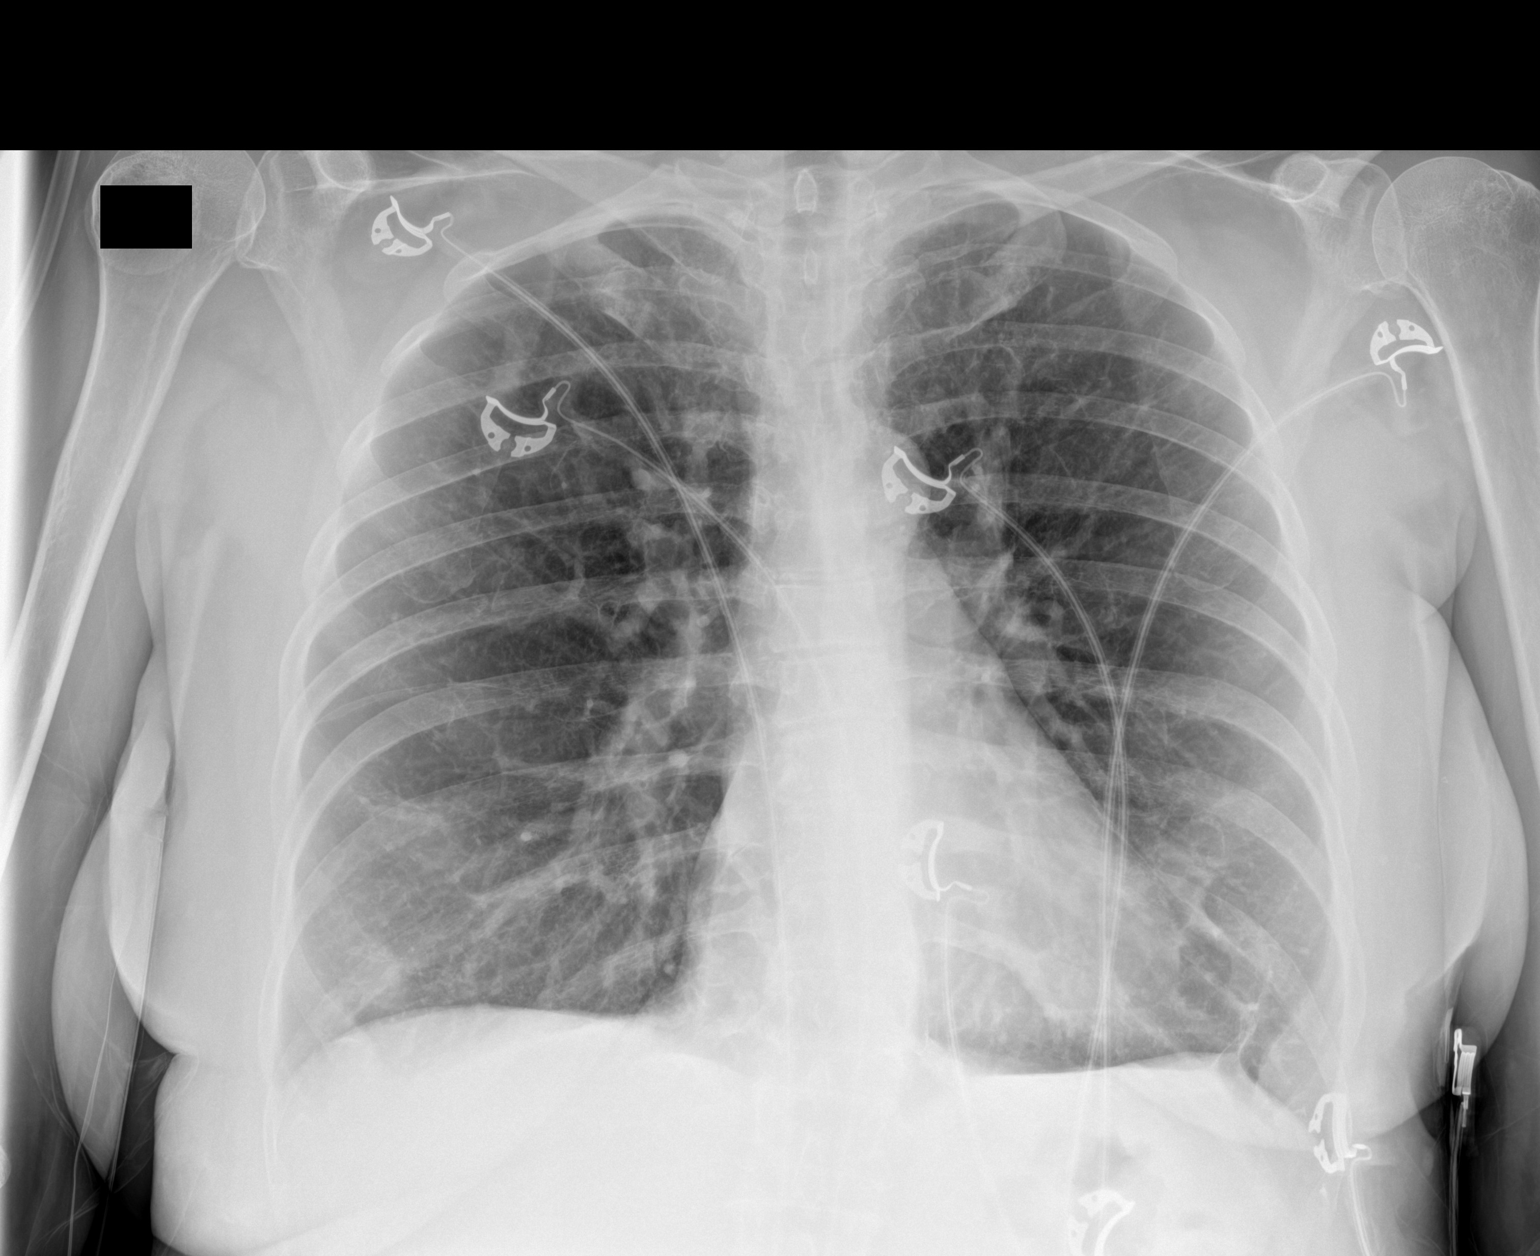

[1 of 1 positions shown; findings below may reference images not displayed]

FINDINGS: Portable AP upright view at [AH] hours. Chronic pulmonary
hyperinflation. Stable lung volumes. Normal cardiac size and
mediastinal contours. Visualized tracheal air column is within
normal limits. Chronic increased pulmonary interstitial markings. No
pneumothorax, pulmonary edema, pleural effusion or acute pulmonary
opacity.

Negative visible bowel gas.  Stable visualized osseous structures.
IMPRESSION: Chronic hyperinflation and interstitial lung changes. No acute
cardiopulmonary abnormality.

## 2021-01-20 MED ORDER — PREDNISONE 20 MG PO TABS: 60.0000 mg | ORAL_TABLET | Freq: Every day | ORAL | 0 refills | Status: DC

## 2021-01-20 MED ORDER — FENTANYL CITRATE (PF) 100 MCG/2ML IJ SOLN
50.0000 ug | Freq: Once | INTRAMUSCULAR | Status: AC
  Administered 2021-01-20: 50 ug via INTRAVENOUS
  Filled 2021-01-20: qty 2

## 2021-01-20 MED ORDER — LORAZEPAM 2 MG/ML IJ SOLN
1.0000 mg | Freq: Once | INTRAMUSCULAR | Status: AC
  Administered 2021-01-20: 1 mg via INTRAVENOUS
  Filled 2021-01-20: qty 1

## 2021-01-20 MED ORDER — LORAZEPAM 1 MG PO TABS: 1.0000 mg | ORAL_TABLET | Freq: Three times a day (TID) | ORAL | 0 refills | Status: DC | PRN

## 2021-01-20 MED ORDER — ALBUTEROL SULFATE HFA 108 (90 BASE) MCG/ACT IN AERS: 2.0000 | INHALATION_SPRAY | RESPIRATORY_TRACT | 0 refills | Status: DC | PRN

## 2021-01-20 MED ORDER — METHYLPREDNISOLONE SODIUM SUCC 125 MG IJ SOLR
125.0000 mg | Freq: Once | INTRAMUSCULAR | Status: AC
  Administered 2021-01-20: 125 mg via INTRAVENOUS
  Filled 2021-01-20: qty 2

## 2021-01-20 MED ORDER — IPRATROPIUM-ALBUTEROL 0.5-2.5 (3) MG/3ML IN SOLN
3.0000 mL | RESPIRATORY_TRACT | Status: AC
  Administered 2021-01-20 (×3): 3 mL via RESPIRATORY_TRACT
  Filled 2021-01-20 (×3): qty 3

## 2021-01-20 MED ORDER — ONDANSETRON HCL 4 MG/2ML IJ SOLN
4.0000 mg | Freq: Once | INTRAMUSCULAR | Status: AC
  Administered 2021-01-20: 4 mg via INTRAVENOUS
  Filled 2021-01-20: qty 2

## 2021-01-20 NOTE — ED Notes (Signed)
2LNC applied in triage

## 2021-01-20 NOTE — ED Triage Notes (Signed)
Back pain onset yesterday without fall or injury. Reports originates in neck and radiates down spine. Described as muscle spasm. Pt with audible wheeze in triage and reports hx of COPD. Pt Spo2 88-89% RA. Reports intermittent tingling in hands bilaterally.

## 2021-01-20 NOTE — ED Provider Notes (Signed)
[facility] Emergency Department Provider Note  ____________________________________________   Event Date/Time   First MD Initiated Contact with Patient 01/20/21 0300     (approximate)  I have reviewed the triage vital signs and the nursing notes.   HISTORY  Chief Complaint Back pain   HPI Colleen [name] is a 60 y.o. female with history of chronic back pain, COPD, anxiety who presents to the emergency department with complaints of back pain that has been ongoing for the past several days.  She states it feels like a muscle spasm that starts in her neck and radiates all the way down her back.  She feels that this is an acute exacerbation of her chronic pain.  No new injury.  No numbness, tingling, focal weakness, bowel or bladder incontinence, urinary retention, fever.  No previous back surgery but has had epidural injection about a month ago.  She sees pain management.  She is on Lyrica, and is supposed to be starting Cymbalta.  She was taken off tramadol several days ago.  She also reports feeling short of breath and is wheezing.  She wears oxygen at night.  Found to be hypoxic on room air here.  She feels left-sided chest tightness.  No history of CAD, PE, DVT.  No lower extremity swelling or pain.  No fever or cough.        Past Medical History:  Diagnosis Date  . Anxiety   . Asthma   . Breast cancer ([place]) 2004   left breast  . Chronic back pain   . COPD (chronic obstructive pulmonary disease) ([place])   . Depression   . Personal history of radiation therapy     Patient Active Problem List   Diagnosis Date Noted  . Myofascial pain syndrome 07/09/2020  . Lumbar radiculopathy 07/09/2020  . Chronic pain syndrome 06/04/2020  . 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 Lumbar facet arthropathy (L4,5 S1) 06/04/2020  . Cervical facet joint syndrome 06/04/2020  . Osteopenia of multiple sites 06/04/2020  . Age related osteoporosis 06/04/2020  .  Low back pain 04/27/2020  . COPD exacerbation ([place]) 03/20/2020  . Asthma, chronic, unspecified asthma severity, with acute exacerbation 03/20/2020  . Panic attack 03/20/2020  . Acute on chronic respiratory failure with hypoxia ([place])   . Sepsis ([place]) 10/11/2018  . CAP (community acquired pneumonia) 10/11/2018  . COPD (chronic obstructive pulmonary disease) ([place]) 10/11/2018  . GAD (generalized anxiety disorder) 10/11/2018    Past Surgical History:  Procedure Laterality Date  . BREAST BIOPSY Left 2004   positive  . BREAST BIOPSY Right    neg  . BREAST LUMPECTOMY Left 2004   positive  . MASTECTOMY Left   . MASTECTOMY    . NEPHRECTOMY Left   . TUBAL LIGATION      Prior to Admission medications   Medication Sig Start Date End Date Taking? Authorizing Provider  albuterol (VENTOLIN HFA) 108 (90 Base) MCG/ACT inhaler Inhale 2 puffs into the lungs every 4 (four) hours as needed for wheezing or shortness of breath. 01/20/21  Yes Ovie Cornelio, Cyril Mourning N, DO  LORazepam (ATIVAN) 1 MG tablet Take 1 tablet (1 mg total) by mouth every 8 (eight) hours as needed for anxiety. 01/20/21 01/20/22 Yes Jayvin Hurrell, Delice Bison, DO  predniSONE (DELTASONE) 20 MG tablet Take 3 tablets (60 mg total) by mouth daily. 01/20/21  Yes Cola Highfill, Cyril Mourning N, DO  ALBUTEROL IN Inhale into the lungs.    [provider]  aspirin EC 81  MG tablet Take 81 mg by mouth daily.    [provider]  cetirizine-pseudoephedrine (ZYRTEC-D) 5-120 MG tablet Take 1 tablet by mouth 2 (two) times daily as needed for allergies.    [provider]  Cholecalciferol 25 MCG (1000 UT) tablet Take by mouth.    [provider]  cyclobenzaprine (FLEXERIL) 10 MG tablet Take 10 mg by mouth 3 (three) times daily as needed for muscle spasms.    [provider]  denosumab (PROLIA) 60 MG/ML SOSY injection Inject 60 mg into the skin every 6 (six) months.    [provider]  DULoxetine (CYMBALTA) 20 MG capsule Take 1 capsule  (20 mg total) by mouth daily for 20 days, THEN 2 capsules (40 mg total) daily. 01/17/21 03/18/21  Gillis Santa, MD  EPINEPHrine 0.3 mg/0.3 mL IJ SOAJ injection epinephrine 0.3 mg/0.3 mL injection, auto-injector    [provider]  Fluticasone-Umeclidin-Vilant (TRELEGY ELLIPTA) 100-62.5-25 MCG/INH AEPB Inhale into the lungs once.    [provider]  hydrOXYzine (ATARAX/VISTARIL) 25 MG tablet Take 25 mg by mouth 2 (two) times daily as needed.    [provider]  montelukast (SINGULAIR) 10 MG tablet montelukast 10 mg tablet    [provider]  pregabalin (LYRICA) 75 MG capsule Take 75 mg by mouth 2 (two) times daily.    [provider]  VENTOLIN HFA 108 (90 Base) MCG/ACT inhaler Inhale 1-2 puffs into the lungs every 4 (four) hours as needed for cough or wheezing. 10/02/18   [provider]    Allergies Desvenlafaxine, Morphine and related, Penicillins, Prazosin, Tamoxifen, Trazodone, Clonazepam, Codeine, Hydroxyzine, Paroxetine hcl, and Sulfa antibiotics  Family History  Problem Relation Age of Onset  . Breast cancer Cousin   . Breast cancer Cousin     Social History Social History   Tobacco Use  . Smoking status: Current Every Day Smoker    Packs/day: 0.50    Types: Cigarettes  . Smokeless tobacco: Never Used  Vaping Use  . Vaping Use: Never used  Substance Use Topics  . Alcohol use: Not Currently  . Drug use: Never    Review of Systems Constitutional: No fever. Eyes: No visual changes. ENT: No sore throat. Cardiovascular: +chest pain. Respiratory: + shortness of breath. Gastrointestinal: No nausea, vomiting, diarrhea. Genitourinary: Negative for dysuria. Musculoskeletal: Negative for back pain. Skin: Negative for rash. Neurological: Negative for focal weakness or numbness.  ____________________________________________   PHYSICAL EXAM:  VITAL SIGNS: ED Triage Vitals  Enc Vitals Group     BP 01/20/21 0143 (!)  136/110     Pulse Rate 01/20/21 0143 (!) 107     Resp 01/20/21 0143 20     Temp 01/20/21 0143 97.8 F (36.6 C)     Temp Source 01/20/21 0143 Oral     SpO2 01/20/21 0142 (!) 89 %     Weight 01/20/21 0143 145 lb (65.8 kg)     Height 01/20/21 0143 5' (1.524 m)     Head Circumference --      Peak Flow --      Pain Score 01/20/21 0143 10     Pain Loc --      Pain Edu? --      Excl. in [place]? --    CONSTITUTIONAL: Alert and oriented and responds appropriately to questions.  Appears uncomfortable. HEAD: Normocephalic EYES: Conjunctivae clear, pupils appear equal, EOM appear intact ENT: normal nose; moist mucous membranes NECK: Supple, normal ROM CARD: RRR; S1 and S2 appreciated;  no murmurs, no clicks, no rubs, no gallops RESP: Patient is tachypneic and speaking in truncated sentences.  Very diminished aeration diffusely with some scattered expiratory wheezes.  Hypoxic on room air in the upper 80s.  No rhonchi or rails. ABD/GI: Normal bowel sounds; non-distended; soft, non-tender, no rebound, no guarding, no peritoneal signs, no hepatosplenomegaly BACK: The back appears normal no midline spinal tenderness or step-off or deformity, tender to palpation over the cervical paraspinal muscles bilaterally without redness, warmth, soft tissue swelling, ecchymosis, rashes or other lesions EXT: Normal ROM in all joints; no deformity noted, no edema; no cyanosis, no calf tenderness or calf swelling SKIN: Normal color for age and race; warm; no rash on exposed skin NEURO: Moves all extremities equally, sensation to light touch intact diffusely, no saddle anesthesia, no clonus, strength 5/5 in all 4 extremities PSYCH: The patient's mood and manner are appropriate.  ____________________________________________   LABS (all labs ordered are listed, but only abnormal results are displayed)  Labs Reviewed  BASIC METABOLIC PANEL - Abnormal; Notable for the following components:      Result Value   Glucose,  Bld 111 (*)    Calcium 8.8 (*)    All other components within normal limits  CBC WITH DIFFERENTIAL/PLATELET  TROPONIN I (HIGH SENSITIVITY)  TROPONIN I (HIGH SENSITIVITY)   ____________________________________________  EKG   EKG Interpretation  Date/Time:  Sunday January 20 2021 03:01:09 EDT Ventricular Rate:  96 PR Interval:    QRS Duration: 88 QT Interval:  408 QTC Calculation: 513 R Axis:   88 Text Interpretation: Sinus rhythm Right atrial enlargement Borderline right axis deviation Prolonged QT interval Confirmed by Pryor Curia [phone]) on 01/20/2021 3:06:20 AM       ____________________________________________  RADIOLOGY Jessie Foot Zylee Marchiano, personally viewed and evaluated these images (plain radiographs) as part of my medical decision making, as well as reviewing the written report by the radiologist.  ED MD interpretation: Chest x-ray shows no acute abnormality.  Official radiology report(s): DG Chest Portable 1 View  Result Date: 01/20/2021 CLINICAL DATA:  60 year old female with shortness of breath, back pain with no known injury. Smoker. EXAM: PORTABLE CHEST 1 VIEW COMPARISON:  Portable chest 12/18/2019 and earlier. FINDINGS: Portable AP upright view at 0333 hours. Chronic pulmonary hyperinflation. Stable lung volumes. Normal cardiac size and mediastinal contours. Visualized tracheal air column is within normal limits. Chronic increased pulmonary interstitial markings. No pneumothorax, pulmonary edema, pleural effusion or acute pulmonary opacity. Negative visible bowel gas.  Stable visualized osseous structures. IMPRESSION: Chronic hyperinflation and interstitial lung changes. No acute cardiopulmonary abnormality. Electronically Signed   By: Genevie Ann M.D.   On: 01/20/2021 04:15    ____________________________________________   PROCEDURES  Procedure(s) performed (including Critical Care):  Procedures    ____________________________________________   INITIAL  IMPRESSION / ASSESSMENT AND PLAN / ED COURSE  As part of my medical decision making, I reviewed the following data within the [place] notes reviewed and incorporated, Labs reviewed , EKG interpreted , Old EKG reviewed, Radiograph reviewed , Notes from prior ED visits and [place] Controlled Substance Database         Patient here with complaints of an acute exacerbation of her chronic back pain.  No focal neurologic deficits.  Did have an epidural injection she reports about a month ago but no fever.  Doubt cauda equina, epidural abscess or hematoma, discitis or osteomyelitis, transverse myelitis, fracture.  No injury.  Neurologically intact here.  Will give fentanyl for pain  control and reassess.  She also is complaining of chest pain or shortness of breath.  EKG shows no ischemic abnormality.  Suspect COPD exacerbation.  Will give duo nebs, Solu-Medrol.  ACS, PE, dissection also on the differential.  Will obtain cardiac labs, chest x-ray.  ED PROGRESS  Patient reports pain improved briefly after fentanyl.  Initial labs unrevealing.  No leukocytosis.  Normal hemoglobin, electrolytes.  First troponin is 4.  Chest x-ray shows findings consistent with COPD.  Her audible wheezing has resolved after DuoNeb treatments but she still is sounds diminished.  She states she is not feeling short of breath at this time.  She states however she feels she is having a panic attack and requesting something for anxiety.  Will give Ativan.  Second troponin pending.  7:25 AM  Pt's repeat troponin is flat at 4.  She feels that her chest pain has resolved and her breathing is much better.  She is no longer tachypneic and is able to speak full sentences.  Her aeration has significantly improved.  She also reports she feels her back pain has improved.  She states "I know I am going to do with this all of my life".  She has a pain management specialist for follow-up.  She is on Lyrica and states that  she has been prescribed Cymbalta which she has not yet started.  Have offered her other medications for pain control but she states she feels comfortable with trying Cymbalta first and will follow up with her pain management doctor.  She is asking what she can do for her anxiety.  I have recommended that she follow-up with her PCP for this but will discharge with a short course of Ativan to help with symptom management at home until she can see her PCP.  At this time, I do not feel there is any life-threatening condition present. I have reviewed, interpreted and discussed all results (EKG, imaging, lab, urine as appropriate) and exam findings with patient/family. I have reviewed nursing notes and appropriate previous records.  I feel the patient is safe to be discharged home without further emergent workup and can continue workup as an outpatient as needed. Discussed usual and customary return precautions. Patient/family verbalize understanding and are comfortable with this plan.  Outpatient follow-up has been provided as needed. All questions have been answered.   ____________________________________________   FINAL CLINICAL IMPRESSION(S) / ED DIAGNOSES  Final diagnoses:  Neck pain  COPD with acute exacerbation (HCC)  Atypical chest pain  Anxiety     ED Discharge Orders         Ordered    LORazepam (ATIVAN) 1 MG tablet  Every 8 hours PRN        01/20/21 0730    albuterol (VENTOLIN HFA) 108 (90 Base) MCG/ACT inhaler  Every 4 hours PRN        01/20/21 0730    predniSONE (DELTASONE) 20 MG tablet  Daily        01/20/21 0730          *Please note:  Mallisa Alameda was evaluated in Emergency Department on 01/20/2021 for the symptoms described in the history of present illness. She was evaluated in the context of the global COVID-19 pandemic, which necessitated consideration that the patient might be at risk for infection with the SARS-CoV-2 virus that causes COVID-19. Institutional  protocols and algorithms that pertain to the evaluation of patients at risk for COVID-19 are in a state of rapid change based on information  released by regulatory bodies including the CDC and federal and state organizations. These policies and algorithms were followed during the patient's care in the ED.  Some ED evaluations and interventions may be delayed as a result of limited staffing during and the pandemic.*   Note:  This document was prepared using Dragon voice recognition software and may include unintentional dictation errors.   Breland Trouten, Delice Bison, DO 01/20/21 [phone]

## 2021-01-20 NOTE — ED Notes (Signed)
Signature pad in room not working- pt verbalized understanding of discharge

## 2021-01-20 NOTE — Discharge Instructions (Signed)
I recommend close follow-up with your primary care physician for further management of your anxiety.  Please follow-up closely with your pain management specialist for further management of your back pain.  Please take your steroids until complete and use your albuterol inhaler as needed for shortness of breath, wheezing.  Your cardiac work-up today was reassuring.

## 2021-01-22 ENCOUNTER — Telehealth: Payer: Self-pay | Admitting: Student in an Organized Health Care Education/Training Program

## 2021-01-22 NOTE — Telephone Encounter (Signed)
Last appt 11-19-20, VV.

## 2021-01-22 NOTE — Telephone Encounter (Signed)
Spoke with [patient and informed her of Dr Holley Raring message.

## 2021-01-22 NOTE — Telephone Encounter (Signed)
Patient called stating she is in excruciating pain and had to go to the ED but got no relief. Wants to know if Dr. Holley Raring can do something to help.

## 2021-01-31 ENCOUNTER — Telehealth: Payer: Self-pay

## 2021-01-31 NOTE — Telephone Encounter (Signed)
Please advise 

## 2021-01-31 NOTE — Telephone Encounter (Signed)
Patient notified

## 2021-01-31 NOTE — Telephone Encounter (Signed)
Pt wants to know is there anything else she can do to help her with pain the procedures and medication isn't helping even though she's sensitive to medication is it anything else she can take?

## 2021-03-03 ENCOUNTER — Other Ambulatory Visit: Payer: Self-pay

## 2021-03-03 ENCOUNTER — Emergency Department

## 2021-03-03 ENCOUNTER — Emergency Department
Admission: EM | Admit: 2021-03-03 | Discharge: 2021-03-03 | Disposition: A | Discharge status: Home / Self Care | Attending: Emergency Medicine | Admitting: Emergency Medicine

## 2021-03-03 DIAGNOSIS — F17210 Nicotine dependence, cigarettes, uncomplicated: Secondary | ICD-10-CM | POA: Diagnosis not present

## 2021-03-03 DIAGNOSIS — Z853 Personal history of malignant neoplasm of breast: Secondary | ICD-10-CM | POA: Diagnosis not present

## 2021-03-03 DIAGNOSIS — M7989 Other specified soft tissue disorders: Secondary | ICD-10-CM | POA: Diagnosis not present

## 2021-03-03 DIAGNOSIS — Z79899 Other long term (current) drug therapy: Secondary | ICD-10-CM | POA: Insufficient documentation

## 2021-03-03 DIAGNOSIS — J45901 Unspecified asthma with (acute) exacerbation: Secondary | ICD-10-CM | POA: Diagnosis not present

## 2021-03-03 DIAGNOSIS — F419 Anxiety disorder, unspecified: Secondary | ICD-10-CM

## 2021-03-03 DIAGNOSIS — Z7982 Long term (current) use of aspirin: Secondary | ICD-10-CM | POA: Insufficient documentation

## 2021-03-03 DIAGNOSIS — J449 Chronic obstructive pulmonary disease, unspecified: Secondary | ICD-10-CM

## 2021-03-03 DIAGNOSIS — L299 Pruritus, unspecified: Secondary | ICD-10-CM | POA: Insufficient documentation

## 2021-03-03 DIAGNOSIS — R0602 Shortness of breath: Secondary | ICD-10-CM | POA: Diagnosis present

## 2021-03-03 DIAGNOSIS — T7840XA Allergy, unspecified, initial encounter: Secondary | ICD-10-CM

## 2021-03-03 DIAGNOSIS — J441 Chronic obstructive pulmonary disease with (acute) exacerbation: Secondary | ICD-10-CM | POA: Insufficient documentation

## 2021-03-03 DIAGNOSIS — R0789 Other chest pain: Secondary | ICD-10-CM | POA: Insufficient documentation

## 2021-03-03 DIAGNOSIS — F410 Panic disorder [episodic paroxysmal anxiety] without agoraphobia: Secondary | ICD-10-CM

## 2021-03-03 DIAGNOSIS — T368X5A Adverse effect of other systemic antibiotics, initial encounter: Secondary | ICD-10-CM | POA: Diagnosis not present

## 2021-03-03 LAB — BASIC METABOLIC PANEL
Anion gap: 10 (ref 5–15)
BUN: 6 mg/dL (ref 6–20)
CO2: 27 mmol/L (ref 22–32)
Calcium: 8.8 mg/dL — ABNORMAL LOW (ref 8.9–10.3)
Chloride: 96 mmol/L — ABNORMAL LOW (ref 98–111)
Creatinine, Ser: 0.44 mg/dL (ref 0.44–1.00)
GFR, Estimated: >60 mL/min (ref >60)
Glucose, Bld: 96 mg/dL (ref 70–99)
Potassium: 4 mmol/L (ref 3.5–5.1)
Sodium: 133 mmol/L — ABNORMAL LOW (ref 135–145)

## 2021-03-03 LAB — CBC
HCT: 42.9 % (ref 36.0–46.0)
Hemoglobin: 14.1 g/dL (ref 12.0–15.0)
MCH: 30.4 pg (ref 26.0–34.0)
MCHC: 32.9 g/dL (ref 30.0–36.0)
MCV: 92.5 fL (ref 80.0–100.0)
Platelets: 272 10*3/uL (ref 150–400)
RBC: 4.64 MIL/uL (ref 3.87–5.11)
RDW: 14 % (ref 11.5–15.5)
WBC: 6.6 10*3/uL (ref 4.0–10.5)
nRBC: 0 % (ref 0.0–0.2)

## 2021-03-03 LAB — TROPONIN I (HIGH SENSITIVITY): Troponin I (High Sensitivity): 6 ng/L (ref <18)

## 2021-03-03 IMAGING — CR DG CHEST 2V
1 series · 2 of 2 positions shown · non-contrast
Comparison: [DATE]

CLINICAL DATA: Shortness of breath

EXAM:
CHEST - 2 VIEW

[Series 1: dg chest 2 view · 0.14mm/px · 2 of 2 slices shown]
[im 1/2]
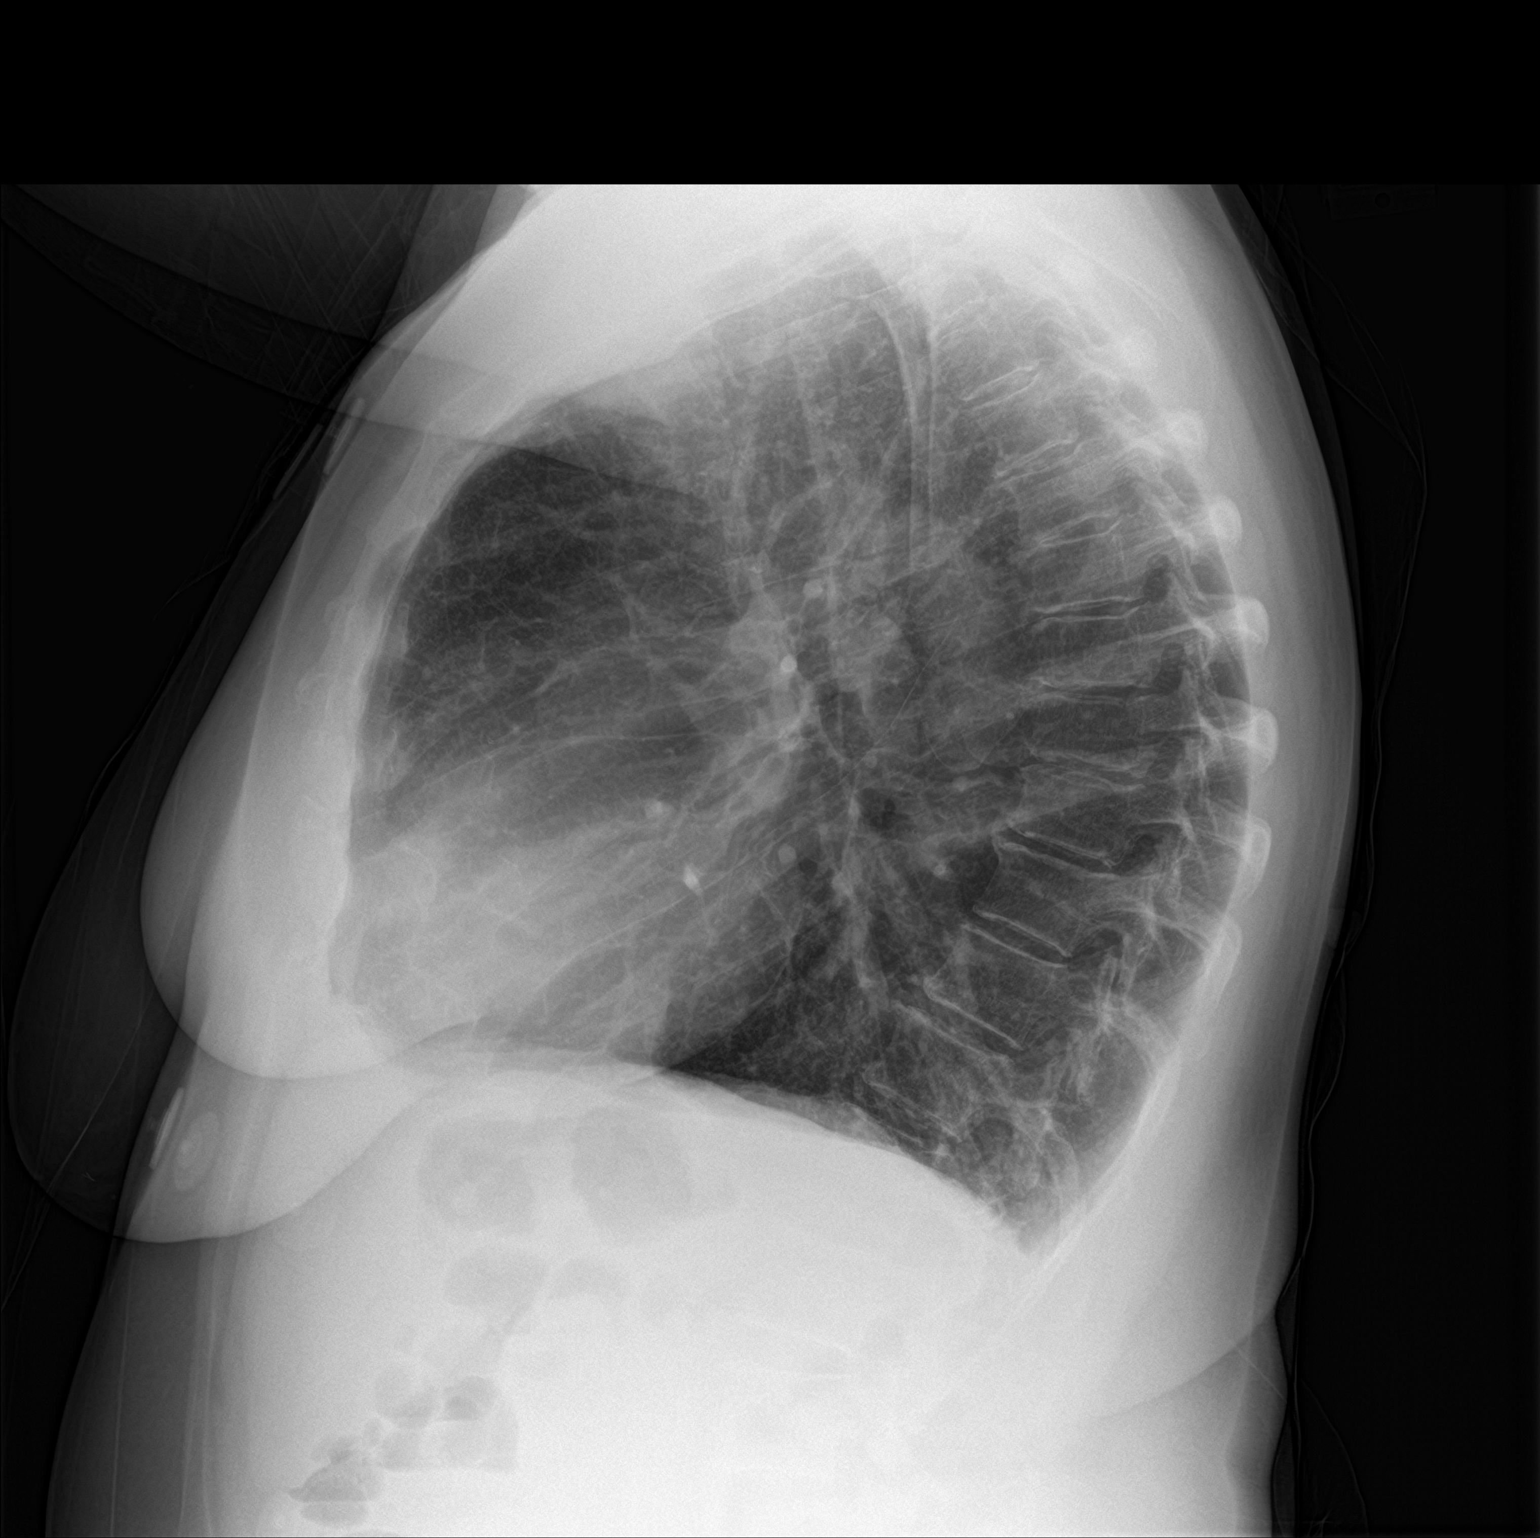
[im 2/2]
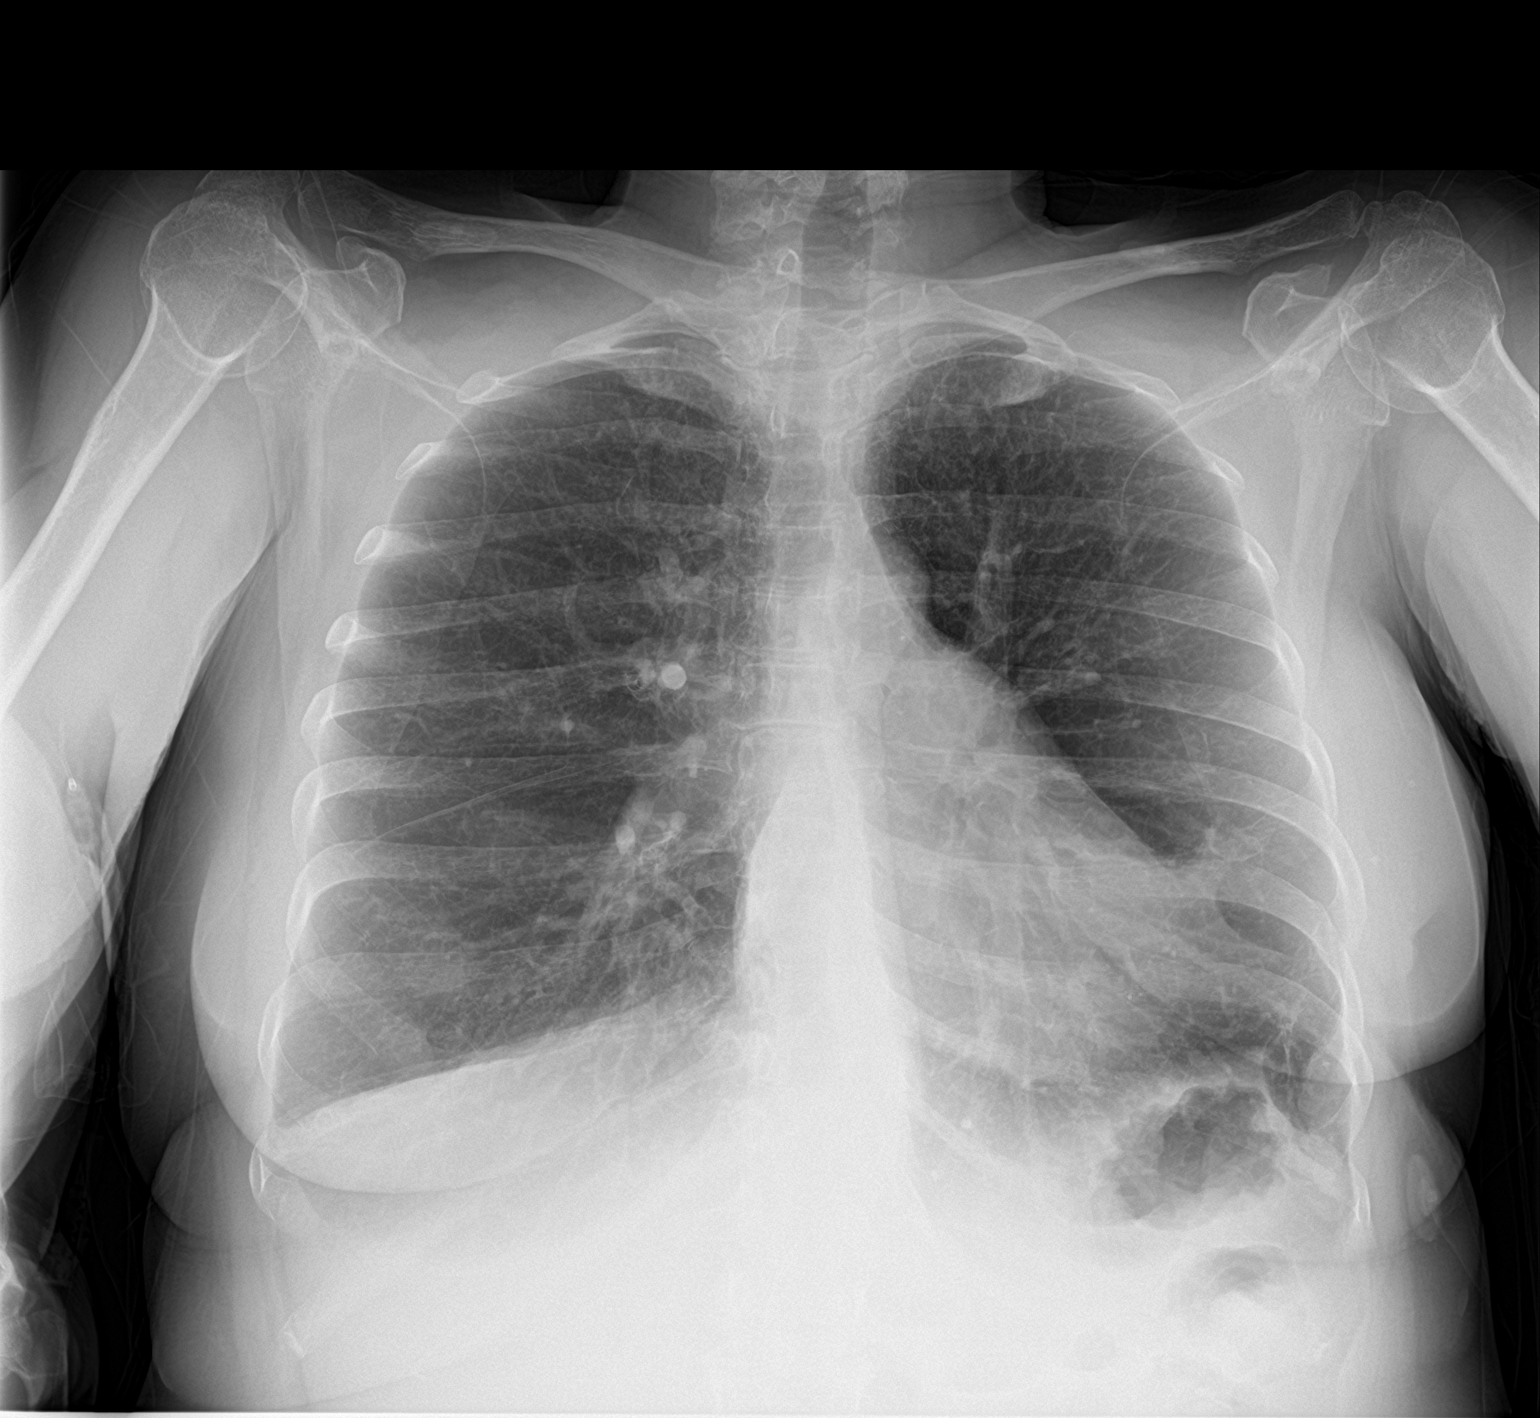

[2 of 2 positions shown; findings below may reference images not displayed]

FINDINGS: Cardiomediastinal contours are within normal limits. Chronic
interstitial prominence with left basilar scarring. No superimposed
airspace opacity. No pleural effusion or pneumothorax. No acute
osseous findings.
IMPRESSION: No active cardiopulmonary disease.

## 2021-03-03 MED ORDER — PROMETHAZINE-DM 6.25-15 MG/5ML PO SYRP: 5.0000 mL | ORAL_SOLUTION | Freq: Four times a day (QID) | ORAL | 0 refills | Status: DC | PRN

## 2021-03-03 MED ORDER — IPRATROPIUM-ALBUTEROL 0.5-2.5 (3) MG/3ML IN SOLN
3.0000 mL | Freq: Once | RESPIRATORY_TRACT | Status: AC
  Administered 2021-03-03: 3 mL via RESPIRATORY_TRACT

## 2021-03-03 MED ORDER — IPRATROPIUM-ALBUTEROL 0.5-2.5 (3) MG/3ML IN SOLN
RESPIRATORY_TRACT | Status: AC
  Administered 2021-03-03: 3 mL via RESPIRATORY_TRACT
  Filled 2021-03-03: qty 3

## 2021-03-03 MED ORDER — METHYLPREDNISOLONE SODIUM SUCC 125 MG IJ SOLR
125.0000 mg | Freq: Once | INTRAMUSCULAR | Status: AC
  Administered 2021-03-03: 125 mg via INTRAVENOUS
  Filled 2021-03-03: qty 2

## 2021-03-03 MED ORDER — IPRATROPIUM-ALBUTEROL 0.5-2.5 (3) MG/3ML IN SOLN
3.0000 mL | Freq: Once | RESPIRATORY_TRACT | Status: AC
  Administered 2021-03-03: 3 mL via RESPIRATORY_TRACT
  Filled 2021-03-03: qty 3

## 2021-03-03 MED ORDER — IPRATROPIUM-ALBUTEROL 0.5-2.5 (3) MG/3ML IN SOLN
3.0000 mL | Freq: Once | RESPIRATORY_TRACT | Status: AC
Start: 2021-03-03 — End: 2021-03-03
  Filled 2021-03-03: qty 3

## 2021-03-03 MED ORDER — PREDNISONE 20 MG PO TABS: 20.0000 mg | ORAL_TABLET | Freq: Every day | ORAL | 0 refills | Status: DC

## 2021-03-03 MED ORDER — LORAZEPAM 2 MG/ML IJ SOLN
0.5000 mg | Freq: Once | INTRAMUSCULAR | Status: AC
  Administered 2021-03-03: 0.5 mg via INTRAVENOUS
  Filled 2021-03-03: qty 1

## 2021-03-03 NOTE — Discharge Instructions (Signed)
You were seen today for a possible drug allergy and anxiety.  We gave you a dose of steroids and lorazepam in the ER.  I am given you a prescription for prednisone daily for the next 5 days.  I have given you prescription for cough syrup to take as needed for cough.  Please take as directed.  You have lorazepam and hydroxyzine to take at home as needed for anxiety and itching.  Please follow-up with your PCP.  Have given you referral information for pulmonologist to follow-up regarding management of your COPD.

## 2021-03-03 NOTE — ED Notes (Signed)
Regina NP notified of dyspnea and RA O2sat. New order given.

## 2021-03-03 NOTE — ED Notes (Signed)
Discharge pending waiting on husband to go home and bring back portable oxygen for patient to wear home

## 2021-03-03 NOTE — ED Triage Notes (Signed)
Pt reports SOB, CP and itching. Pt also with some swelling in lower extremities and is concerned if she is having a reaction to something

## 2021-03-03 NOTE — ED Notes (Signed)
Pt states she had a sinus infection and was started on Levofloxacin. Pt states following that medication these symptoms developed. Pt also states hx of COPD, pt states itching to extremities and some swelling. Pt speaking in complete sentences.

## 2021-03-03 NOTE — ED Provider Notes (Signed)
[facility] Emergency Department Provider Note ____________________________________________  Time seen: 1530  I have reviewed the triage vital signs and the nursing notes.  HISTORY  Chief Complaint  Shortness of Breath and Chest Pain   HPI Colleen [name] is a 60 y.o. female  Presents to the ER today with complaint of shortness of breath, chest tightness, itching and swelling in her legs.  She reports this started 2 days ago.  She reports she feels like she is having an allergic reaction due to her previous sensitivities to multiple medications.  She reports she saw her PCP 1 week ago and was treated for a sinus infection with Levofloxacin.  She reports she took the Levofloxacin for 5 days prior to symptom onset.  She reports that shortness of breath is constant.  She has chest tightness but no chest pain.  She denies swelling of the face, lips or tongue.  She reports a history of COPD, current smoker.  She has used her nebulizers and taken her Trelegy, Albuterol without any relief of symptoms. She is on oxygen at home prn at night, reports her sats typically run 88-89.  She is also taken Benadryl OTC with minimal relief of symptoms.  She has no history of CHF but reports this does run in her family.  She reports the shortness of breath is causing her anxiety and making her have a panic attack.  She reports she normally takes Lorazepam at home for this.  She is requesting a dose here. She has a consult pending with psychiatry and a therapist for further evaluation of her anxiety and panic attacks.  Past Medical History:  Diagnosis Date  . Anxiety   . Asthma   . Breast cancer ([place]) 2004   left breast  . Chronic back pain   . COPD (chronic obstructive pulmonary disease) ([place])   . Depression   . Personal history of radiation therapy     Patient Active Problem List   Diagnosis Date Noted  . Myofascial pain syndrome 07/09/2020  . Lumbar radiculopathy 07/09/2020  .  Chronic pain syndrome 06/04/2020  . 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 Lumbar facet arthropathy (L4,5 S1) 06/04/2020  . Cervical facet joint syndrome 06/04/2020  . Osteopenia of multiple sites 06/04/2020  . Age related osteoporosis 06/04/2020  . Low back pain 04/27/2020  . COPD exacerbation ([place]) 03/20/2020  . Asthma, chronic, unspecified asthma severity, with acute exacerbation 03/20/2020  . Panic attack 03/20/2020  . Acute on chronic respiratory failure with hypoxia ([place])   . Sepsis ([place]) 10/11/2018  . CAP (community acquired pneumonia) 10/11/2018  . COPD (chronic obstructive pulmonary disease) ([place]) 10/11/2018  . GAD (generalized anxiety disorder) 10/11/2018    Past Surgical History:  Procedure Laterality Date  . BREAST BIOPSY Left 2004   positive  . BREAST BIOPSY Right    neg  . BREAST LUMPECTOMY Left 2004   positive  . MASTECTOMY Left   . MASTECTOMY    . NEPHRECTOMY Left   . TUBAL LIGATION      Prior to Admission medications   Medication Sig Start Date End Date Taking? Authorizing Provider  predniSONE (DELTASONE) 20 MG tablet Take 1 tablet (20 mg total) by mouth daily with breakfast. 03/03/21  Yes Garnette Greb, Coralie Keens, NP  promethazine-dextromethorphan (PROMETHAZINE-DM) 6.25-15 MG/5ML syrup Take 5 mLs by mouth 4 (four) times daily as needed for cough. 03/03/21  Yes Jearld Fenton, NP  albuterol (VENTOLIN HFA) 108 (90 Base) MCG/ACT inhaler Inhale 2  puffs into the lungs every 4 (four) hours as needed for wheezing or shortness of breath. 01/20/21   Ward, Delice Bison, DO  ALBUTEROL IN Inhale into the lungs.    [provider]  aspirin EC 81 MG tablet Take 81 mg by mouth daily.    [provider]  cetirizine-pseudoephedrine (ZYRTEC-D) 5-120 MG tablet Take 1 tablet by mouth 2 (two) times daily as needed for allergies.    [provider]  Cholecalciferol 25 MCG (1000 UT) tablet Take by mouth.    [provider]  cyclobenzaprine  (FLEXERIL) 10 MG tablet Take 10 mg by mouth 3 (three) times daily as needed for muscle spasms.    [provider]  denosumab (PROLIA) 60 MG/ML SOSY injection Inject 60 mg into the skin every 6 (six) months.    [provider]  DULoxetine (CYMBALTA) 20 MG capsule Take 1 capsule (20 mg total) by mouth daily for 20 days, THEN 2 capsules (40 mg total) daily. 01/17/21 03/18/21  Gillis Santa, MD  EPINEPHrine 0.3 mg/0.3 mL IJ SOAJ injection epinephrine 0.3 mg/0.3 mL injection, auto-injector    [provider]  Fluticasone-Umeclidin-Vilant (TRELEGY ELLIPTA) 100-62.5-25 MCG/INH AEPB Inhale into the lungs once.    [provider]  hydrOXYzine (ATARAX/VISTARIL) 25 MG tablet Take 25 mg by mouth 2 (two) times daily as needed.    [provider]  LORazepam (ATIVAN) 1 MG tablet Take 1 tablet (1 mg total) by mouth every 8 (eight) hours as needed for anxiety. 01/20/21 01/20/22  Ward, Delice Bison, DO  montelukast (SINGULAIR) 10 MG tablet montelukast 10 mg tablet    [provider]  pregabalin (LYRICA) 75 MG capsule Take 75 mg by mouth 2 (two) times daily.    [provider]  VENTOLIN HFA 108 (90 Base) MCG/ACT inhaler Inhale 1-2 puffs into the lungs every 4 (four) hours as needed for cough or wheezing. 10/02/18   [provider]    Allergies Desvenlafaxine, Morphine and related, Penicillins, Prazosin, Tamoxifen, Trazodone, Clonazepam, Codeine, Hydroxyzine, Paroxetine hcl, and Sulfa antibiotics  Family History  Problem Relation Age of Onset  . Breast cancer Cousin   . Breast cancer Cousin     Social History Social History   Tobacco Use  . Smoking status: Current Every Day Smoker    Packs/day: 0.50    Types: Cigarettes  . Smokeless tobacco: Never Used  Vaping Use  . Vaping Use: Never used  Substance Use Topics  . Alcohol use: Not Currently  . Drug use: Never    Review of Systems  Constitutional: Negative for fever, chills or body  aches. Eyes: Negative for visual changes. ENT: Negative for swelling of the face, lips or tongue Cardiovascular: Positive for chest tightness, swelling in the lower extremities.  Negative for chest pain. Respiratory: Positive for shortness of breath.  Negative for cough. Gastrointestinal: Negative for vomiting and diarrhea. Musculoskeletal: Negative for joint pain or swelling. Skin: Positive for hives. Neurological: Negative for  focal weakness, tingling or numbness. Psych: Positive for anxiety, panic attacks.  Negative for depression, SI/HI. ____________________________________________  PHYSICAL EXAM:  VITAL SIGNS: ED Triage Vitals  Enc Vitals Group     BP 03/03/21 1418 (!) 142/95     Pulse Rate 03/03/21 1418 100     Resp 03/03/21 1418 19     Temp 03/03/21 1418 98 F (36.7 C)     Temp src --      SpO2 03/03/21 1418 92 %     Weight --  Height --      Head Circumference --      Peak Flow --      Pain Score 03/03/21 1413 0     Pain Loc --      Pain Edu? --      Excl. in [place]? --     Constitutional: Alert and oriented.   Head: Normocephalic. Eyes: Normal extraocular movements.  No periorbital edema noted Mouth/Throat: Mucous membranes are moist.  No swelling of the lips or tongue noted. Cardiovascular: Tachycardic. No JVD. Trace non pitting BLE. Respiratory: Slightly increased respiratory effort.  Dyspnea noted with speech.  Diminished breath sounds throughout.  No wheezing, rales or rhonchi noted. Neurologic:  Normal speech and language. No gross focal neurologic deficits are appreciated. Skin:  Skin is warm, dry and intact. No rash noted. Psychiatric: Anxious appearing. ____________________________________________   LABS Labs Reviewed  BASIC METABOLIC PANEL - Abnormal; Notable for the following components:      Result Value   Sodium 133 (*)    Chloride 96 (*)    Calcium 8.8 (*)    All other components within normal limits  CBC  TROPONIN I (HIGH SENSITIVITY)   TROPONIN I (HIGH SENSITIVITY)    ____________________________________________  EKG ED ECG REPORT   Date: 03/03/2021  EKG Time: 5:36 PM  Rate: 99  Rhythm: NSR, unchanged from ECG 01/20/21  Axis: right  ST&T Change: None  Narrative Interpretation: normal rate and rhythm, no acute findings   ____________________________________________   RADIOLOGY  Imaging Orders     DG Chest 2 View IMPRESSION: No active cardiopulmonary disease.   ____________________________________________   INITIAL IMPRESSION / ASSESSMENT AND PLAN / ED COURSE  SOB, Chest Tightness, Itching, Anxiety:  DDx include allergic reaction, COPD exacerbation, new onset CHF, pulmonary embolism, pneumonia, anxiety with panic attack  Workup c/w with allergic reaction to medication, anxiety/panic attack secondary to SOB Duoneb x 3 given in the ER CBC, BMP, Troponin unremarkable Chest xray unremarkable ECG shows NSR, no acute findings Discussed obtaining CTA chest but given the fact that her symptoms improved after Solumedrol 125 mg IV x 1 and Lorazepam 0.5 mg IV x 1, I do not think this is necessary. RX for Prednisone 20 mg PO x 5 days RX for Promethazine DM for cough Encouraged her to continue her Trelegy, Albuterol and nebulizers as prescribed Encouraged smoking cessation She has Lorazepam at home to take as needed for anxiety and panic attacks She will follow up psychiatry/therapy as previously scheduled Will have her follow up with pulmonology, Dr. Raul Del as an outpatient for management of her COPD She has Hydroxyzine to take as needed for itching ER return precautions discussed      I reviewed the patient's prescription history over the last 12 months in the multi-state controlled substances database(s) that includes [place], [place], [place], [place], [place], [place], [place], [place], [place], [place], [place], [place], [place], and [place].  Results were notable  for Lorazepam 1 mg #30 3/15, Lorazepam 1 mg #10, 3/13, Tramadol 50 mg #45 12/23. ____________________________________________  FINAL CLINICAL IMPRESSION(S) / ED DIAGNOSES  Final diagnoses:  Allergic reaction to drug, initial encounter  Anxiety  Panic attack  Chronic obstructive pulmonary disease, unspecified COPD type [facility])      Jearld Fenton, NP 03/03/21 1736    Vanessa [place], MD 03/05/21 [phone]

## 2021-03-03 NOTE — ED Notes (Signed)
Pt on abx for sinus infection but feels like getting worse. C/o SHOB. Hx COPD. Only wears oxygen at home intermittently. Has had some chest tightness as well.  No fever.

## 2021-03-04 ENCOUNTER — Encounter: Payer: Self-pay | Admitting: Psychiatry

## 2021-03-04 ENCOUNTER — Telehealth (INDEPENDENT_AMBULATORY_CARE_PROVIDER_SITE_OTHER): Admitting: Psychiatry

## 2021-03-04 DIAGNOSIS — G4701 Insomnia due to medical condition: Secondary | ICD-10-CM | POA: Diagnosis not present

## 2021-03-04 DIAGNOSIS — F411 Generalized anxiety disorder: Secondary | ICD-10-CM | POA: Diagnosis not present

## 2021-03-04 DIAGNOSIS — F4310 Post-traumatic stress disorder, unspecified: Secondary | ICD-10-CM

## 2021-03-04 DIAGNOSIS — F331 Major depressive disorder, recurrent, moderate: Secondary | ICD-10-CM | POA: Diagnosis not present

## 2021-03-04 MED ORDER — DULOXETINE HCL 30 MG PO CPEP: 30.0000 mg | ORAL_CAPSULE | Freq: Two times a day (BID) | ORAL | 0 refills | Status: DC
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
## 2021-03-04 NOTE — Patient Instructions (Signed)
Duloxetine Delayed-Release Capsules What is this medicine? DULOXETINE (doo LOX e teen) is used to treat depression, anxiety, and different types of chronic pain. This medicine may be used for other purposes; ask your health care provider or pharmacist if you have questions. COMMON BRAND NAME(S): Cymbalta, Creig Hines, Irenka What should I tell my health care provider before I take this medicine? They need to know if you have any of these conditions:  bipolar disorder  glaucoma  high blood pressure  kidney disease  liver disease  seizures  suicidal thoughts, plans or attempt; a previous suicide attempt by you or a family member  take medicines that treat or prevent blood clots  taken medicines called MAOIs like Carbex, Eldepryl, Marplan, Nardil, and Parnate within 14 days  trouble passing urine  an unusual reaction to duloxetine, other medicines, foods, dyes, or preservatives  pregnant or trying to get pregnant  breast-feeding How should I use this medicine? Take this medicine by mouth with a glass of water. Follow the directions on the prescription label. Do not crush, cut or chew some capsules of this medicine. Some capsules may be opened and sprinkled on applesauce. Check with your doctor or pharmacist if you are not sure. You can take this medicine with or without food. Take your medicine at regular intervals. Do not take your medicine more often than directed. Do not stop taking this medicine suddenly except upon the advice of your doctor. Stopping this medicine too quickly may cause serious side effects or your condition may worsen. A special MedGuide will be given to you by the pharmacist with each prescription and refill. Be sure to read this information carefully each time. Talk to your pediatrician regarding the use of this medicine in children. While this drug may be prescribed for children as young as 55 years of age for selected conditions, precautions do  apply. Overdosage: If you think you have taken too much of this medicine contact a poison control center or emergency room at once. NOTE: This medicine is only for you. Do not share this medicine with others. What if I miss a dose? If you miss a dose, take it as soon as you can. If it is almost time for your next dose, take only that dose. Do not take double or extra doses. What may interact with this medicine? Do not take this medicine with any of the following medications:  desvenlafaxine  levomilnacipran  linezolid  MAOIs like Carbex, Eldepryl, Emsam, Marplan, Nardil, and Parnate  methylene blue (injected into a vein)  milnacipran  safinamide  thioridazine  venlafaxine  viloxazine This medicine may also interact with the following medications:  alcohol  amphetamines  aspirin and aspirin-like medicines  certain antibiotics like ciprofloxacin and enoxacin  certain medicines for blood pressure, heart disease, irregular heart beat  certain medicines for depression, anxiety, or psychotic disturbances  certain medicines for migraine headache like almotriptan, eletriptan, frovatriptan, naratriptan, rizatriptan, sumatriptan, zolmitriptan  certain medicines that treat or prevent blood clots like warfarin, enoxaparin, and dalteparin  cimetidine  fentanyl  lithium  NSAIDS, medicines for pain and inflammation, like ibuprofen or naproxen  phentermine  procarbazine  rasagiline  sibutramine  St. John's wort  theophylline  tramadol  tryptophan This list may not describe all possible interactions. Give your health care provider a list of all the medicines, herbs, non-prescription drugs, or dietary supplements you use. Also tell them if you smoke, drink alcohol, or use illegal drugs. Some items may interact with your medicine. What  should I watch for while using this medicine? Tell your doctor if your symptoms do not get better or if they get worse. Visit your  doctor or healthcare provider for regular checks on your progress. Because it may take several weeks to see the full effects of this medicine, it is important to continue your treatment as prescribed by your doctor. This medicine may cause serious skin reactions. They can happen weeks to months after starting the medicine. Contact your healthcare provider right away if you notice fevers or flu-like symptoms with a rash. The rash may be red or purple and then turn into blisters or peeling of the skin. Or, you might notice a red rash with swelling of the face, lips, or lymph nodes in your neck or under your arms. Patients and their families should watch out for new or worsening thoughts of suicide or depression. Also watch out for sudden changes in feelings such as feeling anxious, agitated, panicky, irritable, hostile, aggressive, impulsive, severely restless, overly excited and hyperactive, or not being able to sleep. If this happens, especially at the beginning of treatment or after a change in dose, call your healthcare provider. You may get drowsy or dizzy. Do not drive, use machinery, or do anything that needs mental alertness until you know how this medicine affects you. Do not stand or sit up quickly, especially if you are an older patient. This reduces the risk of dizzy or fainting spells. Alcohol may interfere with the effect of this medicine. Avoid alcoholic drinks. This medicine can cause an increase in blood pressure. This medicine can also cause a sudden drop in your blood pressure, which may make you feel faint and increase the chance of a fall. These effects are most common when you first start the medicine or when the dose is increased, or during use of other medicines that can cause a sudden drop in blood pressure. Check with your doctor for instructions on monitoring your blood pressure while taking this medicine. Your mouth may get dry. Chewing sugarless gum or sucking hard candy, and drinking  plenty of water, may help. Contact your doctor if the problem does not go away or is severe. What side effects may I notice from receiving this medicine? Side effects that you should report to your doctor or health care professional as soon as possible:  allergic reactions like skin rash, itching or hives, swelling of the face, lips, or tongue  anxious  breathing problems  confusion  changes in vision  chest pain  confusion  elevated mood, decreased need for sleep, racing thoughts, impulsive behavior  eye pain  fast, irregular heartbeat  feeling faint or lightheaded, falls  feeling agitated, angry, or irritable  hallucination, loss of contact with reality  high blood pressure  loss of balance or coordination  palpitations  redness, blistering, peeling or loosening of the skin, including inside the mouth  restlessness, pacing, inability to keep still  seizures  stiff muscles  suicidal thoughts or other mood changes  trouble passing urine or change in the amount of urine  trouble sleeping  unusual bleeding or bruising  unusually weak or tired  vomiting  yellowing of the eyes or skin Side effects that usually do not require medical attention (report to your doctor or health care professional if they continue or are bothersome):  change in sex drive or performance  change in appetite or weight  constipation  dizziness  dry mouth  headache  increased sweating  nausea  tired  This list may not describe all possible side effects. Call your doctor for medical advice about side effects. You may report side effects to FDA at 1-800-FDA-1088. Where should I keep my medicine? Keep out of the reach of children and pets. Store at room temperature between 15 and 30 degrees C (59 to 86 degrees F). Get rid of any unused medicine after the expiration date. To get rid of medicines that are no longer needed or have expired:  Take the medicine to a medicine  take-back program. Check with your pharmacy or law enforcement to find a location.  If you cannot return the medicine, check the label or package insert to see if the medicine should be thrown out in the garbage or flushed down the toilet. If you are not sure, ask your health care provider. If it is safe to put it in the trash, take the medicine out of the container. Mix the medicine with cat litter, dirt, coffee grounds, or other unwanted substance. Seal the mixture in a bag or container. Put it in the trash. NOTE: This sheet is a summary. It may not cover all possible information. If you have questions about this medicine, talk to your doctor, pharmacist, or health care provider.  2021 [employer] (2020-09-13 16:06:16)

## 2021-03-05 ENCOUNTER — Other Ambulatory Visit: Payer: Self-pay

## 2021-03-05 ENCOUNTER — Emergency Department
Admission: EM | Admit: 2021-03-05 | Discharge: 2021-03-06 | Disposition: A | Discharge status: Other Acute Inpatient Hospital | Attending: Emergency Medicine | Admitting: Emergency Medicine

## 2021-03-05 ENCOUNTER — Encounter: Payer: Self-pay | Admitting: Psychiatry

## 2021-03-05 DIAGNOSIS — M549 Dorsalgia, unspecified: Secondary | ICD-10-CM | POA: Diagnosis not present

## 2021-03-05 DIAGNOSIS — M545 Low back pain, unspecified: Secondary | ICD-10-CM | POA: Diagnosis present

## 2021-03-05 DIAGNOSIS — Z20822 Contact with and (suspected) exposure to covid-19: Secondary | ICD-10-CM | POA: Insufficient documentation

## 2021-03-05 DIAGNOSIS — J441 Chronic obstructive pulmonary disease with (acute) exacerbation: Secondary | ICD-10-CM | POA: Diagnosis not present

## 2021-03-05 DIAGNOSIS — J45909 Unspecified asthma, uncomplicated: Secondary | ICD-10-CM | POA: Insufficient documentation

## 2021-03-05 DIAGNOSIS — F17210 Nicotine dependence, cigarettes, uncomplicated: Secondary | ICD-10-CM | POA: Diagnosis not present

## 2021-03-05 DIAGNOSIS — J189 Pneumonia, unspecified organism: Secondary | ICD-10-CM | POA: Diagnosis present

## 2021-03-05 DIAGNOSIS — Z853 Personal history of malignant neoplasm of breast: Secondary | ICD-10-CM | POA: Diagnosis not present

## 2021-03-05 DIAGNOSIS — F331 Major depressive disorder, recurrent, moderate: Secondary | ICD-10-CM | POA: Diagnosis not present

## 2021-03-05 DIAGNOSIS — M5126 Other intervertebral disc displacement, lumbar region: Secondary | ICD-10-CM | POA: Diagnosis present

## 2021-03-05 DIAGNOSIS — F339 Major depressive disorder, recurrent, unspecified: Secondary | ICD-10-CM

## 2021-03-05 DIAGNOSIS — F411 Generalized anxiety disorder: Secondary | ICD-10-CM | POA: Diagnosis not present

## 2021-03-05 DIAGNOSIS — Z7951 Long term (current) use of inhaled steroids: Secondary | ICD-10-CM | POA: Insufficient documentation

## 2021-03-05 DIAGNOSIS — R45851 Suicidal ideations: Secondary | ICD-10-CM | POA: Diagnosis not present

## 2021-03-05 DIAGNOSIS — F410 Panic disorder [episodic paroxysmal anxiety] without agoraphobia: Secondary | ICD-10-CM | POA: Diagnosis present

## 2021-03-05 DIAGNOSIS — G8929 Other chronic pain: Secondary | ICD-10-CM

## 2021-03-05 DIAGNOSIS — Z7982 Long term (current) use of aspirin: Secondary | ICD-10-CM | POA: Insufficient documentation

## 2021-03-05 DIAGNOSIS — F419 Anxiety disorder, unspecified: Secondary | ICD-10-CM | POA: Diagnosis not present

## 2021-03-05 DIAGNOSIS — G894 Chronic pain syndrome: Secondary | ICD-10-CM | POA: Diagnosis present

## 2021-03-05 DIAGNOSIS — Z046 Encounter for general psychiatric examination, requested by authority: Secondary | ICD-10-CM | POA: Insufficient documentation

## 2021-03-05 DIAGNOSIS — J9621 Acute and chronic respiratory failure with hypoxia: Secondary | ICD-10-CM | POA: Diagnosis present

## 2021-03-05 DIAGNOSIS — J449 Chronic obstructive pulmonary disease, unspecified: Secondary | ICD-10-CM | POA: Diagnosis present

## 2021-03-05 DIAGNOSIS — F4310 Post-traumatic stress disorder, unspecified: Secondary | ICD-10-CM | POA: Diagnosis present

## 2021-03-05 LAB — COMPREHENSIVE METABOLIC PANEL
ALT: 13 U/L (ref 0–44)
AST: 21 U/L (ref 15–41)
Albumin: 4.3 g/dL (ref 3.5–5.0)
Alkaline Phosphatase: 39 U/L (ref 38–126)
Anion gap: 10 (ref 5–15)
BUN: 6 mg/dL (ref 6–20)
CO2: 25 mmol/L (ref 22–32)
Calcium: 9.1 mg/dL (ref 8.9–10.3)
Chloride: 97 mmol/L — ABNORMAL LOW (ref 98–111)
Creatinine, Ser: 0.47 mg/dL (ref 0.44–1.00)
GFR, Estimated: >60 mL/min (ref >60)
Glucose, Bld: 111 mg/dL — ABNORMAL HIGH (ref 70–99)
Potassium: 4.5 mmol/L (ref 3.5–5.1)
Sodium: 132 mmol/L — ABNORMAL LOW (ref 135–145)
Total Bilirubin: 0.6 mg/dL (ref 0.3–1.2)
Total Protein: 7.3 g/dL (ref 6.5–8.1)

## 2021-03-05 LAB — CBC
HCT: 41.6 % (ref 36.0–46.0)
Hemoglobin: 13.9 g/dL (ref 12.0–15.0)
MCH: 30.5 pg (ref 26.0–34.0)
MCHC: 33.4 g/dL (ref 30.0–36.0)
MCV: 91.2 fL (ref 80.0–100.0)
Platelets: 275 10*3/uL (ref 150–400)
RBC: 4.56 MIL/uL (ref 3.87–5.11)
RDW: 14.3 % (ref 11.5–15.5)
WBC: 8.2 10*3/uL (ref 4.0–10.5)
nRBC: 0.2 % (ref 0.0–0.2)

## 2021-03-05 LAB — SALICYLATE LEVEL: Salicylate Lvl: <7 mg/dL — ABNORMAL LOW (ref 7.0–30.0)

## 2021-03-05 LAB — URINE DRUG SCREEN, QUALITATIVE (ARMC ONLY)
Amphetamines, Ur Screen: NOT DETECTED
Barbiturates, Ur Screen: NOT DETECTED
Benzodiazepine, Ur Scrn: NOT DETECTED
Cannabinoid 50 Ng, Ur ~~LOC~~: NOT DETECTED
Cocaine Metabolite,Ur ~~LOC~~: NOT DETECTED
MDMA (Ecstasy)Ur Screen: NOT DETECTED
Methadone Scn, Ur: NOT DETECTED
Opiate, Ur Screen: NOT DETECTED
Phencyclidine (PCP) Ur S: NOT DETECTED
Tricyclic, Ur Screen: NOT DETECTED

## 2021-03-05 LAB — T4, FREE: Free T4: 0.9 ng/dL (ref 0.61–1.12)

## 2021-03-05 LAB — ETHANOL: Alcohol, Ethyl (B): <10 mg/dL (ref <10)

## 2021-03-05 LAB — ACETAMINOPHEN LEVEL: Acetaminophen (Tylenol), Serum: <10 ug/mL — ABNORMAL LOW (ref 10–30)

## 2021-03-05 LAB — RESP PANEL BY RT-PCR (FLU A&B, COVID) ARPGX2
Influenza A by PCR: NEGATIVE
Influenza B by PCR: NEGATIVE
SARS Coronavirus 2 by RT PCR: NEGATIVE

## 2021-03-05 LAB — TSH: TSH: 0.616 u[IU]/mL (ref 0.350–4.500)

## 2021-03-05 MED ORDER — IPRATROPIUM-ALBUTEROL 0.5-2.5 (3) MG/3ML IN SOLN
3.0000 mL | Freq: Once | RESPIRATORY_TRACT | Status: AC
  Administered 2021-03-05: 3 mL via RESPIRATORY_TRACT
  Filled 2021-03-05: qty 3

## 2021-03-05 MED ORDER — DIPHENHYDRAMINE HCL 25 MG PO CAPS
50.0000 mg | ORAL_CAPSULE | Freq: Once | ORAL | Status: AC
  Administered 2021-03-05: 50 mg via ORAL
  Filled 2021-03-05 (×2): qty 2

## 2021-03-05 MED ORDER — LIDOCAINE 5 % EX PTCH
1.0000 | MEDICATED_PATCH | CUTANEOUS | Status: DC
  Administered 2021-03-05: 1 via TRANSDERMAL
  Filled 2021-03-05: qty 1

## 2021-03-05 MED ORDER — FLUTICASONE-UMECLIDIN-VILANT 100-62.5-25 MCG/INH IN AEPB: 1.0000 | INHALATION_SPRAY | Freq: Every day | RESPIRATORY_TRACT | Status: DC

## 2021-03-05 MED ORDER — LORAZEPAM 1 MG PO TABS
1.0000 mg | ORAL_TABLET | Freq: Once | ORAL | Status: AC
  Administered 2021-03-05: 1 mg via ORAL
  Filled 2021-03-05 (×2): qty 1

## 2021-03-05 MED ORDER — ASPIRIN EC 81 MG PO TBEC
81.0000 mg | DELAYED_RELEASE_TABLET | Freq: Every day | ORAL | Status: DC
  Administered 2021-03-05: 81 mg via ORAL
  Filled 2021-03-05: qty 1

## 2021-03-05 MED ORDER — ALBUTEROL SULFATE HFA 108 (90 BASE) MCG/ACT IN AERS
1.0000 | INHALATION_SPRAY | RESPIRATORY_TRACT | Status: DC | PRN
Start: 2021-03-05 — End: 2021-03-05

## 2021-03-05 MED ORDER — UMECLIDINIUM BROMIDE 62.5 MCG/INH IN AEPB
1.0000 | INHALATION_SPRAY | Freq: Every day | RESPIRATORY_TRACT | Status: DC
  Administered 2021-03-06: 1 via RESPIRATORY_TRACT
  Filled 2021-03-05: qty 7

## 2021-03-05 MED ORDER — ACETAMINOPHEN 500 MG PO TABS
1000.0000 mg | ORAL_TABLET | ORAL | Status: AC
  Administered 2021-03-05: 1000 mg via ORAL
  Filled 2021-03-05: qty 2

## 2021-03-05 MED ORDER — ALBUTEROL SULFATE HFA 108 (90 BASE) MCG/ACT IN AERS
1.0000 | INHALATION_SPRAY | Freq: Four times a day (QID) | RESPIRATORY_TRACT | Status: DC | PRN
  Administered 2021-03-05 – 2021-03-06 (×3): 2 via RESPIRATORY_TRACT
  Filled 2021-03-05: qty 6.7

## 2021-03-05 MED ORDER — FLUTICASONE FUROATE-VILANTEROL 100-25 MCG/INH IN AEPB
1.0000 | INHALATION_SPRAY | Freq: Every day | RESPIRATORY_TRACT | Status: DC
  Filled 2021-03-05: qty 28

## 2021-03-05 NOTE — ED Notes (Signed)
Hourly rounding reveals patient in room. No complaints, stable, in no acute distress. Q15 minute rounds and monitoring via Rover and Officer to continue.   

## 2021-03-05 NOTE — ED Notes (Signed)
Patient on 2L of oxygen per EDP order.

## 2021-03-05 NOTE — Consult Note (Addendum)
[facility] Face-to-Face Psychiatry Consult   Reason for Consult: Anxiety and Suicidal Referring Physician: Dr. Jari Pigg Patient Identification: Colleen [name] MRN:  [id number] Principal Diagnosis: <principal problem not specified> Diagnosis:  Active Problems:   CAP (community acquired pneumonia)   COPD (chronic obstructive pulmonary disease) ([place])   GAD (generalized anxiety disorder)   Acute on chronic respiratory failure with hypoxia (HCC)   Panic attack   Chronic pain syndrome   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, L5/S1)   Low back pain   PTSD (post-traumatic stress disorder)   Total Time spent with patient: 30 minutes  Subjective: "I am not staying here. When do you think I can leave?" Colleen [name] is a 60 y.o. female patient presented to [facility] ED via [place] voluntarily initially and then the patient was placed under involuntary commitment status (IVC) by the EDP due to her voicing she was not staying here.  EDP Dr. Jari Pigg reported that the patient said that her depression had gotten so bad that she picked up a knife and was going to cut her wrist but that she put the knife down and told her husband. The patient does not deny that this was an attempt to hurt herself. "I didn't do anything."  The patient states, "I commented today because I was tired of having pain, not sleeping, and not able to do the things I wanted to."  The patient explained her pain as being constant and stated, "it is like bone rubbing against bone."  The patient discussed she has COPD and uses intermittent oxygen at home. Per the ED triage nurse, Pt c/o having suicidal thoughts today, increased anxiety, states she has not been sleeping due to chronic pain with her neck and back.   The patient was seen face-to-face by this provider; the chart was reviewed and consulted with Dr. Jari Pigg on 03/05/2021 due to the patient's care. It was discussed with the EDP that the patient does meet the criteria to be admitted to the psychiatric inpatient  unit. It is a high probability that she might not be able to be admitted to an inpatient facility due to her COPD diagnosis and her need for intermittent oxygen therapy. The patient might need to be reassessed in the morning. We could put a safety plan in place with her husband; she has a therapist and an outpatient psychiatrist who could assist in maintaining her safety.  On evaluation, the patient is alert and oriented x4, anxious, cooperative, and mood-congruent with affect. The patient does not appear to be responding to internal or external stimuli. Neither is the patient presenting with any delusional thinking. The patient denies auditory or visual hallucinations. The patient is currently denying suicidal ideation, but quickly she grabbed a knife to stab herself. The patient denies homicidal or self-harm ideations. The patient is not presenting with any psychotic or paranoid behaviors. During an encounter with the patient, she was able to answer questions appropriately.  HPI: Per Dr. Jari Pigg; Colleen [name] is a 60 y.o. female with anxiety, depression who comes in with suicidal thoughts today and increase in anxiety.  Patient reports having chronic back pain.  She reports severe reactions to medications including tramadol which she completely blacked out from.  She is followed by Dr. Holley Raring from pain management who started her on Cymbalta for her pain.  She most recently started psychotherapy a month ago.  Patient reports increasing sadness and depression mostly associated with her chronic back pain.  Patient states that her sadness was so severe that today  she picked up a knife and was going to cut her wrist but she then put down the knife and told her husband about it so her husband brought her to the emergency room.  Patient states that she does not want to stay here any longer.  Patient's SI is severe, constant, nothing makes it better, worse due to her chronic back pain.  Patient denies any numbness or  tingling, weakness, bowel or bladder incontinence urinary retention or other concerns.  She denies any new injuries.  Patient has had prior MRIs that have shown lumbar disc herniation.  Patient was seen by psychiatry over a video call yesterday.  During the teleconference it was recommended to increase his Cymbalta to 60 mg daily in divided doses and continuing therapy. They wanted her thyroid checked as well.   Past Psychiatric History:  Anxiety Depression  Risk to Self:   Risk to Others:   Prior Inpatient Therapy:   Prior Outpatient Therapy:    Past Medical History:  Past Medical History:  Diagnosis Date  . Anxiety   . Asthma   . Breast cancer ([place]) 2004   left breast  . Chronic back pain   . COPD (chronic obstructive pulmonary disease) ([place])   . Depression   . Personal history of radiation therapy     Past Surgical History:  Procedure Laterality Date  . BREAST BIOPSY Left 2004   positive  . BREAST BIOPSY Right    neg  . BREAST LUMPECTOMY Left 2004   positive  . MASTECTOMY Left   . MASTECTOMY    . NEPHRECTOMY Left   . TUBAL LIGATION     Family History:  Family History  Problem Relation Age of Onset  . Breast cancer Cousin   . Breast cancer Cousin   . Bipolar disorder Daughter   . Drug abuse Daughter   . Alcohol abuse Maternal Grandfather    Family Psychiatric  History:  Social History:  Social History   Substance and Sexual Activity  Alcohol Use Yes     Social History   Substance and Sexual Activity  Drug Use Never    Social History   Socioeconomic History  . Marital status: Married    Spouse name: Not on file  . Number of children: 2  . Years of education: GED  . Highest education level: Not on file  Occupational History  . Not on file  Tobacco Use  . Smoking status: Current Every Day Smoker    Packs/day: 0.50    Types: Cigarettes  . Smokeless tobacco: Never Used  Vaping Use  . Vaping Use: Never used  Substance and Sexual Activity  .  Alcohol use: Yes  . Drug use: Never  . Sexual activity: Not Currently  Other Topics Concern  . Not on file  Social History Narrative  . Not on file   Social Determinants of Health   Financial Resource Strain: Not on file  Food Insecurity: Not on file  Transportation Needs: Not on file  Physical Activity: Not on file  Stress: Not on file  Social Connections: Not on file   Additional Social History:    Allergies:   Allergies  Allergen Reactions  . Desvenlafaxine Anaphylaxis  . Morphine And Related Anaphylaxis  . Penicillins Anaphylaxis    Has patient had a PCN reaction causing immediate rash, facial/tongue/throat swelling, SOB or lightheadedness with hypotension: Yes Has patient had a PCN reaction causing severe rash involving mucus membranes or skin necrosis: No Has patient  had a PCN reaction that required hospitalization: Unknown Has patient had a PCN reaction occurring within the last 10 years: No If all of the above answers are "NO", then may proceed with Cephalosporin use.   . Prazosin Other (See Comments)  . Tamoxifen Anaphylaxis  . Trazodone Anaphylaxis  . Clonazepam   . Codeine Swelling  . Hydroxyzine Itching  . Paroxetine Hcl Hives  . Sulfa Antibiotics Itching    Labs:  Results for orders placed or performed during the hospital encounter of 03/05/21 (from the past 48 hour(s))  Comprehensive metabolic panel     Status: Abnormal   Collection Time: 03/05/21  2:00 PM  Result Value Ref Range   Sodium 132 (L) 135 - 145 mmol/L   Potassium 4.5 3.5 - 5.1 mmol/L   Chloride 97 (L) 98 - 111 mmol/L   CO2 25 22 - 32 mmol/L   Glucose, Bld 111 (H) 70 - 99 mg/dL    Comment: Glucose reference range applies only to samples taken after fasting for at least 8 hours.   BUN 6 6 - 20 mg/dL   Creatinine, Ser 0.47 0.44 - 1.00 mg/dL   Calcium 9.1 8.9 - 10.3 mg/dL   Total Protein 7.3 6.5 - 8.1 g/dL   Albumin 4.3 3.5 - 5.0 g/dL   AST 21 15 - 41 U/L   ALT 13 0 - 44 U/L   Alkaline  Phosphatase 39 38 - 126 U/L   Total Bilirubin 0.6 0.3 - 1.2 mg/dL   GFR, Estimated >60 >60 mL/min    Comment: (NOTE) Calculated using the CKD-EPI Creatinine Equation (2021)    Anion gap 10 5 - 15    Comment: Performed at [facility], [address] [place], [place] [address]  Ethanol     Status: None   Collection Time: 03/05/21  2:00 PM  Result Value Ref Range   Alcohol, Ethyl (B) <10 <10 mg/dL    Comment: (NOTE) Lowest detectable limit for serum alcohol is 10 mg/dL.  For medical purposes only. Performed at [facility], [address] [place], [place] [id number]   Salicylate level     Status: Abnormal   Collection Time: 03/05/21  2:00 PM  Result Value Ref Range   Salicylate Lvl [id number] (L) 7.0 - 30.0 mg/dL    Comment: Performed at [facility], [place], [place] [address]  Acetaminophen level     Status: Abnormal   Collection Time: 03/05/21  2:00 PM  Result Value Ref Range   Acetaminophen (Tylenol), Serum <10 (L) 10 - 30 ug/mL    Comment: (NOTE) Therapeutic concentrations vary significantly. A range of 10-30 ug/mL  may be an effective concentration for many patients. However, some  are best treated at concentrations outside of this range. Acetaminophen concentrations >150 ug/mL at 4 hours after ingestion  and >50 ug/mL at 12 hours after ingestion are often associated with  toxic reactions.  Performed at [facility], [address] [place], [place] [address]   cbc     Status: None   Collection Time: 03/05/21  2:00 PM  Result Value Ref Range   WBC 8.2 4.0 - 10.5 K/uL   RBC 4.56 3.87 - 5.11 MIL/uL   Hemoglobin 13.9 12.0 - 15.0 g/dL   HCT 41.6 36.0 - 46.0 %   MCV 91.2 80.0 - 100.0 fL   MCH 30.5 26.0 - 34.0 pg   MCHC 33.4 30.0 - 36.0 g/dL   RDW 14.3 11.5 -  15.5 %   Platelets 275 150 - 400 K/uL   nRBC 0.2 0.0 - 0.2 %    Comment: Performed at [facility], [address] [place], [place] [address]  Urine  Drug Screen, Qualitative     Status: None   Collection Time: 03/05/21  2:00 PM  Result Value Ref Range   Tricyclic, Ur Screen NONE DETECTED NONE DETECTED   Amphetamines, Ur Screen NONE DETECTED NONE DETECTED   MDMA (Ecstasy)Ur Screen NONE DETECTED NONE DETECTED   Cocaine Metabolite,Ur [place] NONE DETECTED NONE DETECTED   Opiate, Ur Screen NONE DETECTED NONE DETECTED   Phencyclidine (PCP) Ur S NONE DETECTED NONE DETECTED   Cannabinoid 50 Ng, Ur [place] NONE DETECTED NONE DETECTED   Barbiturates, Ur Screen NONE DETECTED NONE DETECTED   Benzodiazepine, Ur Scrn NONE DETECTED NONE DETECTED   Methadone Scn, Ur NONE DETECTED NONE DETECTED    Comment: (NOTE) Tricyclics + metabolites, urine    Cutoff 1000 ng/mL Amphetamines + metabolites, urine  Cutoff 1000 ng/mL MDMA (Ecstasy), urine              Cutoff 500 ng/mL Cocaine Metabolite, urine          Cutoff 300 ng/mL Opiate + metabolites, urine        Cutoff 300 ng/mL Phencyclidine (PCP), urine         Cutoff 25 ng/mL Cannabinoid, urine                 Cutoff 50 ng/mL Barbiturates + metabolites, urine  Cutoff 200 ng/mL Benzodiazepine, urine              Cutoff 200 ng/mL Methadone, urine                   Cutoff 300 ng/mL  The urine drug screen provides only a preliminary, unconfirmed analytical test result and should not be used for non-medical purposes. Clinical consideration and professional judgment should be applied to any positive drug screen result due to possible interfering substances. A more specific alternate chemical method must be used in order to obtain a confirmed analytical result. Gas chromatography / mass spectrometry (GC/MS) is the preferred confirm atory method. Performed at [facility], [address] [place], [place] [address]   TSH     Status: None   Collection Time: 03/05/21  2:00 PM  Result Value Ref Range   TSH 0.616 0.350 - 4.500 uIU/mL    Comment: Performed by a 3rd Generation assay with a functional  sensitivity of <=0.01 uIU/mL. Performed at [facility], [address] [place], [place] [address]   T4, free     Status: None   Collection Time: 03/05/21  2:00 PM  Result Value Ref Range   Free T4 0.90 0.61 - 1.12 ng/dL    Comment: (NOTE) Biotin ingestion may interfere with free T4 tests. If the results are inconsistent with the TSH level, previous test results, or the clinical presentation, then consider biotin interference. If needed, order repeat testing after stopping biotin. Performed at [facility], [address] [place],  [address]   Resp Panel by RT-PCR (Flu A&B, Covid) Nasopharyngeal Swab     Status: None   Collection Time: 03/05/21  3:33 PM   Specimen: Nasopharyngeal Swab; Nasopharyngeal(NP) swabs in vial transport medium  Result Value Ref Range   SARS Coronavirus 2 by RT PCR NEGATIVE NEGATIVE    Comment: (NOTE) SARS-CoV-2 target nucleic acids are NOT DETECTED.  The SARS-CoV-2 RNA  is generally detectable in upper respiratory specimens during the acute phase of infection. The lowest concentration of SARS-CoV-2 viral copies this assay can detect is 138 copies/mL. A negative result does not preclude SARS-Cov-2 infection and should not be used as the sole basis for treatment or other patient management decisions. A negative result may occur with  improper specimen collection/handling, submission of specimen other than nasopharyngeal swab, presence of viral mutation(s) within the areas targeted by this assay, and inadequate number of viral copies(<138 copies/mL). A negative result must be combined with clinical observations, patient history, and epidemiological information. The expected result is Negative.  Fact Sheet for Patients:  EntrepreneurPulse.com.au  Fact Sheet for Healthcare Providers:  IncredibleEmployment.be  This test is no t yet approved or cleared by the [place] FDA and  has been  authorized for detection and/or diagnosis of SARS-CoV-2 by FDA under an Emergency Use Authorization (EUA). This EUA will remain  in effect (meaning this test can be used) for the duration of the COVID-19 declaration under Section 564(b)(1) of the Act, 21 U.S.C.section 360bbb-3(b)(1), unless the authorization is terminated  or revoked sooner.       Influenza A by PCR NEGATIVE NEGATIVE   Influenza B by PCR NEGATIVE NEGATIVE    Comment: (NOTE) The Xpert Xpress SARS-CoV-2/FLU/RSV plus assay is intended as an aid in the diagnosis of influenza from Nasopharyngeal swab specimens and should not be used as a sole basis for treatment. Nasal washings and aspirates are unacceptable for Xpert Xpress SARS-CoV-2/FLU/RSV testing.  Fact Sheet for Patients: EntrepreneurPulse.com.au  Fact Sheet for Healthcare Providers: IncredibleEmployment.be  This test is not yet approved or cleared by the [place] FDA and has been authorized for detection and/or diagnosis of SARS-CoV-2 by FDA under an Emergency Use Authorization (EUA). This EUA will remain in effect (meaning this test can be used) for the duration of the COVID-19 declaration under Section 564(b)(1) of the Act, 21 U.S.C. section 360bbb-3(b)(1), unless the authorization is terminated or revoked.  Performed at [facility], [address], [place], [place] [address]     Current Facility-Administered Medications  Medication Dose Route Frequency Provider Last Rate Last Admin  . albuterol (VENTOLIN HFA) 108 (90 Base) MCG/ACT inhaler 1-2 puff  1-2 puff Inhalation Q6H PRN Vanessa [place], MD   2 puff at 03/05/21 1931  . aspirin EC tablet 81 mg  81 mg Oral Daily Vanessa [place], MD   81 mg at 03/05/21 2035  . fluticasone furoate-vilanterol (BREO ELLIPTA) 100-25 MCG/INH 1 puff  1 puff Inhalation Daily Benita Gutter, RPH       And  . umeclidinium bromide (INCRUSE ELLIPTA) 62.5 MCG/INH 1 puff  1 puff  Inhalation Daily Lockie Mola B, RPH      . lidocaine (LIDODERM) 5 % 1 patch  1 patch Transdermal Q24H Vanessa Houghton, MD   1 patch at 03/05/21 1536   Current Outpatient Medications  Medication Sig Dispense Refill  . aspirin EC 81 MG tablet Take 81 mg by mouth daily.    . cetirizine-pseudoephedrine (ZYRTEC-D) 5-120 MG tablet Take 1 tablet by mouth 2 (two) times daily as needed for allergies.    . Cholecalciferol 25 MCG (1000 UT) tablet Take by mouth.    . denosumab (PROLIA) 60 MG/ML SOSY injection Inject 60 mg into the skin every 6 (six) months.    . DULoxetine (CYMBALTA) 30 MG capsule Take 1 capsule (30 mg total) by mouth 2 (two) times daily. 180 capsule 0  .  EPINEPHrine 0.3 mg/0.3 mL IJ SOAJ injection Inject 0.3 mg into the muscle as needed.    . Fluticasone-Umeclidin-Vilant (TRELEGY ELLIPTA) 100-62.5-25 MCG/INH AEPB Inhale 1 puff into the lungs daily.    . montelukast (SINGULAIR) 10 MG tablet Take 10 mg by mouth at bedtime.    . VENTOLIN HFA 108 (90 Base) MCG/ACT inhaler Inhale 1-2 puffs into the lungs every 4 (four) hours as needed for cough or wheezing.    . predniSONE (DELTASONE) 20 MG tablet Take 1 tablet (20 mg total) by mouth daily with breakfast. (Patient not taking: Reported on 03/05/2021) 5 tablet 0  . promethazine-dextromethorphan (PROMETHAZINE-DM) 6.25-15 MG/5ML syrup Take 5 mLs by mouth 4 (four) times daily as needed for cough. (Patient not taking: Reported on 03/05/2021) 118 mL 0    Musculoskeletal: Strength & Muscle Tone: within normal limits Gait & Station: normal Patient leans: N/A  Psychiatric Specialty Exam:  Presentation  General Appearance: Appropriate for Environment; Fairly Groomed  Eye Contact:Good  Speech:Clear and Coherent  Speech Volume:Decreased  Handedness:Right   Mood and Affect  Mood:Anxious; Irritable  Affect:Depressed; Congruent   Thought Process  Thought Processes:Coherent  Descriptions of Associations:Intact  Orientation:Full (Time,  Place and Person)  Thought Content:Logical  History of Schizophrenia/Schizoaffective disorder:No  Duration of Psychotic Symptoms:No data recorded Hallucinations:Hallucinations: None  Ideas of Reference:None  Suicidal Thoughts:Suicidal Thoughts: No  Homicidal Thoughts:Homicidal Thoughts: No   Sensorium  Memory:Immediate Good; Recent Good; Remote Good  Judgment:Fair  Insight:Fair   Executive Functions  Concentration:Good  Attention Span:Good  Recall:Good  Fund of Knowledge:Good  Language:Good   Psychomotor Activity  Psychomotor Activity:Psychomotor Activity: Normal   Assets  Assets:Communication Skills; Physical Health; Resilience; Social Support   Sleep  Sleep:Sleep: Fair   Physical Exam: Physical Exam Vitals and nursing note reviewed.  Constitutional:      Appearance: Normal appearance. She is normal weight.  HENT:     Nose: Nose normal.     Mouth/Throat:     Mouth: Mucous membranes are dry.  Eyes:     Conjunctiva/sclera: Conjunctivae normal.  Cardiovascular:     Rate and Rhythm: Tachycardia present.  Musculoskeletal:        General: Tenderness present.     Cervical back: Normal range of motion and neck supple.  Neurological:     Mental Status: She is alert and oriented to person, place, and time.  Psychiatric:        Attention and Perception: Attention normal.        Mood and Affect: Mood is anxious and depressed. Affect is flat.        Speech: Speech is delayed.        Behavior: Behavior is slowed.        Thought Content: Thought content includes suicidal ideation.        Cognition and Memory: Cognition normal.        Judgment: Judgment is impulsive.    Review of Systems  Psychiatric/Behavioral: Positive for depression and suicidal ideas. The patient is nervous/anxious and has insomnia.   All other systems reviewed and are negative.  Blood pressure (!) 137/96, pulse (!) 106, temperature 97.9 F (36.6 C), temperature source Oral, resp.  rate (!) 24, height 5' (1.524 m), weight 61.2 kg, SpO2 90 %. Body mass index is 26.37 kg/m.  Treatment Plan Summary: Medication management and Plan The patient is a safety risk to herself and requires psychiatric inpatient admission for stabilization and treatment.  Disposition: Recommend psychiatric Inpatient admission when medically cleared. Supportive therapy provided  about ongoing stressors. Caroline Sauger, NP 03/05/2021 10:29 PM

## 2021-03-05 NOTE — ED Notes (Signed)
Pt sat in chair in dayroom. "You people are making this worse.  I'm claustrophobic and don't want to be here anymore. I checked myself in and I want to check myself out."   RN called TTS to see if psychiatrist is still seeing patients today.

## 2021-03-05 NOTE — BH Assessment (Signed)
Comprehensive Clinical Assessment (CCA) Note  03/05/2021 Colleen [name] [id number]  Chief Complaint: Patient is a 60 year old female presenting to [facility] ED initially voluntary but has since been IVC'd by attending ER doctor. Per triage note Pt c/o having suicidal thoughts today, increased anxiety, states she has not been sleeping due to chronic pain with her neck and back. During assessment patient appears alert and oriented x4, calm and cooperative. Patient reports "I made a comment and it got out of hand, a comment that was claiming I was suicidal." It was reported by EDP Dr. Jari Pigg that patient said that her depression has gotten so bad that she picked up a knife and was going to cut her wrist but that she put the knife down and told her husband. Patient is now denying that this was an attempt to hurt herself "I didn't do anything." Patient reports that she has chronic pain and that she is "in physical pain all the time." Patient reports that her sleep is poor and that her appetite "there's days I eat and some days I don't." Patient currently denies SI/HI/AH/VH and does not appear to be responding to any internal or external stimuli.  Per Psyc NP Ysidro Evert patient is recommended for Inpatient Hospitalization  Chief Complaint  Patient presents with  . Anxiety  . Suicidal   Visit Diagnosis: Major Depressive Disorder, recurrent episode, moderate   CCA Screening, Triage and Referral (STR)  Patient Reported Information How did you hear about [place]? Self  Referral name: No data recorded Referral phone number: No data recorded  Whom do you see for routine medical problems? Primary Care  Practice/Facility Name: Dr. Holley Raring  Practice/Facility Phone Number: No data recorded Name of Contact: No data recorded Contact Number: No data recorded Contact Fax Number: No data recorded Prescriber Name: No data recorded Prescriber Address (if known): No data recorded  What Is the Reason for Your  Visit/Call Today? No data recorded How Long Has This Been Causing You Problems? > than 6 months  What Do You Feel Would Help You the Most Today? No data recorded  Have You Recently Been in Any Inpatient Treatment (Hospital/Detox/Crisis Center/28-Day Program)? No  Name/Location of Program/Hospital:No data recorded How Long Were You There? No data recorded When Were You Discharged? No data recorded  Have You Ever Received Services From [facility] Before? No  Who Do You See at [facility]? No data recorded  Have You Recently Had Any Thoughts About Hurting Yourself? Yes  Are You Planning to Commit Suicide/Harm Yourself At This time? No   Have you Recently Had Thoughts About [place]? No  Explanation: No data recorded  Have You Used Any Alcohol or Drugs in the Past 24 Hours? No  How Long Ago Did You Use Drugs or Alcohol? No data recorded What Did You Use and How Much? No data recorded  Do You Currently Have a Therapist/Psychiatrist? Yes  Name of Therapist/Psychiatrist: Unknown   Have You Been Recently Discharged From Any Office Practice or Programs? No  Explanation of Discharge From Practice/Program: No data recorded    CCA Screening Triage Referral Assessment Type of Contact: Face-to-Face  Is this Initial or Reassessment? No data recorded Date Telepsych consult ordered in [facility]:  No data recorded Time Telepsych consult ordered in [facility]:  No data recorded  Patient Reported Information Reviewed? Yes  Patient Left Without Being Seen? No data recorded Reason for Not Completing Assessment: No data recorded  Collateral Involvement: No data recorded  Does Patient Have a Stage manager Guardian? No data recorded Name and Contact of Legal Guardian: No data recorded If Minor and Not Living with Parent(s), Who has Custody? No data recorded Is CPS involved or ever been involved? Never  Is APS involved or ever been involved? Never   Patient Determined To Be  At Risk for Harm To Self or Others Based on Review of Patient Reported Information or Presenting Complaint? Yes, for Self-Harm  Method: No data recorded Availability of Means: No data recorded Intent: No data recorded Notification Required: No data recorded Additional Information for Danger to Others Potential: No data recorded Additional Comments for Danger to Others Potential: No data recorded Are There Guns or Other Weapons in Your Home? No data recorded Types of Guns/Weapons: No data recorded Are These Weapons Safely Secured?                            No data recorded Who Could Verify You Are Able To Have These Secured: No data recorded Do You Have any Outstanding Charges, Pending Court Dates, Parole/Probation? No data recorded Contacted To Inform of Risk of Harm To Self or Others: No data recorded  Location of Assessment: [facility] ED   Does Patient Present under Involuntary Commitment? Yes  IVC Papers Initial File Date: 03/05/2021   [place] of Residence: [place]   Patient Currently Receiving the Following Services: No data recorded  Determination of Need: Emergent (2 hours)   Options For Referral: No data recorded    CCA Biopsychosocial Intake/Chief Complaint:  Patient presents initially voluntary but has since been IVC'd due to SI  Current Symptoms/Problems: Patient presents initially voluntary but has since been IVC'd due to SI   Patient Reported Schizophrenia/Schizoaffective Diagnosis in Past: No   Strengths: Patient is able to communicate  Preferences: Unknown  Abilities: Patient is able to communicate   Type of Services Patient Feels are Needed: NOne   Initial Clinical Notes/Concerns: None   Mental Health Symptoms Depression:  Change in energy/activity; Irritability   Duration of Depressive symptoms: Greater than two weeks   Mania:  None   Anxiety:   None   Psychosis:  None   Duration of Psychotic symptoms: No data recorded  Trauma:  None    Obsessions:  None   Compulsions:  None   Inattention:  None   Hyperactivity/Impulsivity:  N/A   Oppositional/Defiant Behaviors:  None   Emotional Irregularity:  None   Other Mood/Personality Symptoms:  No data recorded   Mental Status Exam Appearance and self-care  Stature:  Average   Weight:  Average weight   Clothing:  Casual   Grooming:  Normal   Cosmetic use:  None   Posture/gait:  Normal   Motor activity:  Not Remarkable   Sensorium  Attention:  Normal   Concentration:  Normal   Orientation:  X5   Recall/memory:  Normal   Affect and Mood  Affect:  Congruent   Mood:  Depressed   Relating  Eye contact:  Normal   Facial expression:  Responsive   Attitude toward examiner:  Cooperative   Thought and Language  Speech flow: Clear and Coherent   Thought content:  Appropriate to Mood and Circumstances   Preoccupation:  None   Hallucinations:  None   Organization:  No data recorded  [employer] of Knowledge:  Fair   Intelligence:  Average   Abstraction:  Normal   Judgement:  Fair  Reality Testing:  Realistic   Insight:  Lacking   Decision Making:  Impulsive   Social Functioning  Social Maturity:  Responsible   Social Judgement:  Normal   Stress  Stressors:  Other (Comment)   Coping Ability:  Normal   Skill Deficits:  None   Supports:  Family     Religion: Religion/Spirituality Are You A Religious Person?: No  Leisure/Recreation: Leisure / Recreation Do You Have Hobbies?: No  Exercise/Diet: Exercise/Diet Do You Exercise?: No Have You Gained or Lost A Significant Amount of Weight in the Past Six Months?: No Do You Follow a Special Diet?: No Do You Have Any Trouble Sleeping?: No   CCA Employment/Education Employment/Work Situation: Employment / Work Copywriter, advertising Employment situation: Unemployed Has patient ever been in the [employer]?: No  Education: Education Is Patient Currently Attending  School?: No Did You Have An Individualized Education Program (IIEP): No Did You Have Any Difficulty At [employer]?: No Patient's Education Has Been Impacted by Current Illness: No   CCA Family/Childhood History Family and Relationship History: Family history Marital status: Married What types of issues is patient dealing with in the relationship?: None reported Additional relationship information: None reported What is your sexual orientation?: Heterosexual Does patient have children?:  (Unknown)  Childhood History:  Childhood History Additional childhood history information: None reported Description of patient's relationship with caregiver when they were a child: None reported Patient's description of current relationship with people who raised him/her: None reported How were you disciplined when you got in trouble as a child/adolescent?: None reported Does patient have siblings?:  (Unknown) Did patient suffer any verbal/emotional/physical/sexual abuse as a child?: No Did patient suffer from severe childhood neglect?: No Has patient ever been sexually abused/assaulted/raped as an adolescent or adult?: No Was the patient ever a victim of a crime or a disaster?: No Witnessed domestic violence?: No Has patient been affected by domestic violence as an adult?: No  Child/Adolescent Assessment:     CCA Substance Use Alcohol/Drug Use: Alcohol / Drug Use Pain Medications: See MAR Prescriptions: See MAR Over the Counter: See MAR History of alcohol / drug use?: No history of alcohol / drug abuse                         ASAM's:  Six Dimensions of Multidimensional Assessment  Dimension 1:  Acute Intoxication and/or Withdrawal Potential:      Dimension 2:  Biomedical Conditions and Complications:      Dimension 3:  Emotional, Behavioral, or Cognitive Conditions and Complications:     Dimension 4:  Readiness to Change:     Dimension 5:  Relapse, Continued use, or Continued  Problem Potential:     Dimension 6:  Recovery/Living Environment:     ASAM Severity Score:    ASAM Recommended Level of Treatment:     Substance use Disorder (SUD)    Recommendations for Services/Supports/Treatments:   Per Psyc NP Ysidro Evert patient is recommended for Inpatient Hospitalization   DSM5 Diagnoses: Patient Active Problem List   Diagnosis Date Noted  . MDD (major depressive disorder), recurrent episode, moderate ([place]) 03/04/2021  . PTSD (post-traumatic stress disorder) 03/04/2021  . Myofascial pain syndrome 07/09/2020  . Lumbar radiculopathy 07/09/2020  . Chronic pain syndrome 06/04/2020  . 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 Lumbar facet arthropathy (L4,5 S1) 06/04/2020  . Cervical facet joint syndrome 06/04/2020  . Osteopenia of multiple sites 06/04/2020  . Age related osteoporosis 06/04/2020  .  Low back pain 04/27/2020  . COPD exacerbation ([place]) 03/20/2020  . Asthma, chronic, unspecified asthma severity, with acute exacerbation 03/20/2020  . Panic attack 03/20/2020  . Acute on chronic respiratory failure with hypoxia ([place])   . Sepsis ([place]) 10/11/2018  . CAP (community acquired pneumonia) 10/11/2018  . COPD (chronic obstructive pulmonary disease) ([place]) 10/11/2018  . GAD (generalized anxiety disorder) 10/11/2018    Patient Centered Plan: Patient is on the following Treatment Plan(s):  Depression   Referrals to Alternative Service(s): Referred to Alternative Service(s):   Place:   Date:   Time:    Referred to Alternative Service(s):   Place:   Date:   Time:    Referred to Alternative Service(s):   Place:   Date:   Time:    Referred to Alternative Service(s):   Place:   Date:   Time:     Lyal Husted A Deforrest Bogle, LCAS-A

## 2021-03-05 NOTE — ED Notes (Signed)
Patient is having breathing treatment. EDP order.

## 2021-03-05 NOTE — ED Notes (Signed)
VS not taken, patient asleep 

## 2021-03-05 NOTE — ED Notes (Signed)
EDP made aware of patient's anxiety.   PRN medication ordered.

## 2021-03-05 NOTE — ED Notes (Signed)
Hourly rounding reveals patient in room. No complaints, stable, in no acute distress. Q15 minute rounds and monitoring via Security Cameras to continue. 

## 2021-03-05 NOTE — BH Assessment (Signed)
[facility] denied due to oxygen and COPD, unable to care for patient's medical issues  Referral information for Psychiatric Hospitalization faxed to;   [facility] [phone]),   . [facility] [phone]---[phone]),  . [facility] [phone], [phone], [phone] or [phone]),   . [facility] [phone]),   . [facility] [phone] -or- [phone]),   . [place] [phone] or [phone]),   . [facility] [phone] -[phone]) [phone]

## 2021-03-05 NOTE — ED Provider Notes (Signed)
MSE was initiated and I personally evaluated the patient and placed orders (if any) at  3:07 PM on March 05, 2021.  The patient appears stable so that the remainder of the MSE may be completed by another provider.   Delman Kitten, MD 03/05/21 [phone]

## 2021-03-05 NOTE — ED Notes (Signed)
Pt stated she has been lied to the entire time she has been here.  "I was told the psychiatrist would see me before he left and I was told I could

## 2021-03-05 NOTE — ED Notes (Signed)
Pt given the phone to call her mother. 

## 2021-03-05 NOTE — ED Notes (Addendum)
Pt's husband came for a visit and verified the patient does use oxygen at night (2-3 L).  EDP and charge nurse made aware.  Pt will be moved back to the quad.

## 2021-03-05 NOTE — BH Assessment (Signed)
PATIENT BED AVAILABLE AFTER 8AM ON 03/06/21  Patient has been accepted to [facility].  Patient assigned to [facility] Accepting physician is Dr. Myer Peer.  Call report to [phone].  Representative was [place].   ER Staff is aware of it:  [facility] ER Secretary  Dr. Jari Pigg, ER MD  [facility] Patient's Nurse       Address: [place], [place]

## 2021-03-05 NOTE — ED Notes (Signed)
Pt dressed out into hospital scrubs. Pt's belongings to include: 1 Blue pants 1 white underwear 1 white bra 1 blue shirt 2 black slippers

## 2021-03-05 NOTE — ED Notes (Signed)
Report to include Situation, Background, Assessment, and Recommendations received from Annie RN. Patient alert and oriented, warm and dry, in no acute distress. Patient denies SI, HI, AVH and pain. Patient made aware of Q15 minute rounds and [place] and Officer presence for their safety. Patient instructed to come to me with needs or concerns. ° ° °

## 2021-03-05 NOTE — ED Provider Notes (Addendum)
[facility] Emergency Department Provider Note  ____________________________________________   Event Date/Time   First MD Initiated Contact with Patient 03/05/21 1500     (approximate)  I have reviewed the triage vital signs and the nursing notes.   HISTORY  Chief Complaint Anxiety and Suicidal    HPI Colleen [name] is a 60 y.o. female with anxiety, depression who comes in with suicidal thoughts today and increase in anxiety.  Patient reports having chronic back pain.  She reports severe reactions to medications including tramadol which she completely blacked out from.  She is followed by Dr. Holley Raring from pain management who started her on Cymbalta for her pain.  She most recently started psychotherapy a month ago.  Patient reports increasing sadness and depression mostly associated with her chronic back pain.  Patient states that her sadness was so severe that today she picked up a knife and was going to cut her wrist but she then put down the knife and told her husband about it so her husband brought her to the emergency room.  Patient states that she does not want to stay here any longer.  Patient's SI is severe, constant, nothing makes it better, worse due to her chronic back pain.  Patient denies any numbness or tingling, weakness, bowel or bladder incontinence urinary retention or other concerns.  She denies any new injuries.  Patient has had prior MRIs that have shown lumbar disc herniation.   Patient was seen by psychiatry over a video call yesterday.  During the teleconference it was recommended to increase his Cymbalta to 60 mg daily in divided doses and continuing therapy. They wanted her thyroid checked as well.           Past Medical History:  Diagnosis Date  . Anxiety   . Asthma   . Breast cancer ([place]) 2004   left breast  . Chronic back pain   . COPD (chronic obstructive pulmonary disease) ([place])   . Depression   . Personal history of  radiation therapy     Patient Active Problem List   Diagnosis Date Noted  . MDD (major depressive disorder), recurrent episode, moderate ([place]) 03/04/2021  . PTSD (post-traumatic stress disorder) 03/04/2021  . Myofascial pain syndrome 07/09/2020  . Lumbar radiculopathy 07/09/2020  . Chronic pain syndrome 06/04/2020  . 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 Lumbar facet arthropathy (L4,5 S1) 06/04/2020  . Cervical facet joint syndrome 06/04/2020  . Osteopenia of multiple sites 06/04/2020  . Age related osteoporosis 06/04/2020  . Low back pain 04/27/2020  . COPD exacerbation ([place]) 03/20/2020  . Asthma, chronic, unspecified asthma severity, with acute exacerbation 03/20/2020  . Panic attack 03/20/2020  . Acute on chronic respiratory failure with hypoxia ([place])   . Sepsis ([place]) 10/11/2018  . CAP (community acquired pneumonia) 10/11/2018  . COPD (chronic obstructive pulmonary disease) ([place]) 10/11/2018  . GAD (generalized anxiety disorder) 10/11/2018    Past Surgical History:  Procedure Laterality Date  . BREAST BIOPSY Left 2004   positive  . BREAST BIOPSY Right    neg  . BREAST LUMPECTOMY Left 2004   positive  . MASTECTOMY Left   . MASTECTOMY    . NEPHRECTOMY Left   . TUBAL LIGATION      Prior to Admission medications   Medication Sig Start Date End Date Taking? Authorizing Provider  aspirin EC 81 MG tablet Take 81 mg by mouth daily.    [provider]  cetirizine-pseudoephedrine (ZYRTEC-D) 5-120 MG  tablet Take 1 tablet by mouth 2 (two) times daily as needed for allergies.    [provider]  Cholecalciferol 25 MCG (1000 UT) tablet Take by mouth.    [provider]  denosumab (PROLIA) 60 MG/ML SOSY injection Inject 60 mg into the skin every 6 (six) months.    [provider]  DULoxetine (CYMBALTA) 30 MG capsule Take 1 capsule (30 mg total) by mouth 2 (two) times daily. 03/04/21   Ursula Alert, MD  EPINEPHrine 0.3 mg/0.3 mL IJ  SOAJ injection epinephrine 0.3 mg/0.3 mL injection, auto-injector    [provider]  Fluticasone-Umeclidin-Vilant (TRELEGY ELLIPTA) 100-62.5-25 MCG/INH AEPB Inhale into the lungs once.    [provider]  montelukast (SINGULAIR) 10 MG tablet montelukast 10 mg tablet    [provider]  predniSONE (DELTASONE) 20 MG tablet Take 1 tablet (20 mg total) by mouth daily with breakfast. 03/03/21   Jearld Fenton, NP  promethazine-dextromethorphan (PROMETHAZINE-DM) 6.25-15 MG/5ML syrup Take 5 mLs by mouth 4 (four) times daily as needed for cough. 03/03/21   Jearld Fenton, NP  VENTOLIN HFA 108 (90 Base) MCG/ACT inhaler Inhale 1-2 puffs into the lungs every 4 (four) hours as needed for cough or wheezing. 10/02/18   [provider]    Allergies Desvenlafaxine, Morphine and related, Penicillins, Prazosin, Tamoxifen, Trazodone, Clonazepam, Codeine, Hydroxyzine, Paroxetine hcl, and Sulfa antibiotics  Family History  Problem Relation Age of Onset  . Breast cancer Cousin   . Breast cancer Cousin   . Bipolar disorder Daughter   . Drug abuse Daughter     Social History Social History   Tobacco Use  . Smoking status: Current Every Day Smoker    Packs/day: 0.50    Types: Cigarettes  . Smokeless tobacco: Never Used  Vaping Use  . Vaping Use: Never used  Substance Use Topics  . Alcohol use: Yes  . Drug use: Never      Review of Systems Constitutional: No fever/chills Eyes: No visual changes. ENT: No sore throat. Cardiovascular: Denies chest pain. Respiratory: Denies shortness of breath. Gastrointestinal: No abdominal pain.  No nausea, no vomiting.  No diarrhea.  No constipation. Genitourinary: Negative for dysuria. Musculoskeletal: Chronic back pain. Skin: Negative for rash. Neurological: Negative for headaches, focal weakness or numbness. Psych: SI All other ROS negative ____________________________________________   PHYSICAL EXAM:  VITAL  SIGNS: ED Triage Vitals  Enc Vitals Group     BP 03/05/21 1327 (!) 137/96     Pulse Rate 03/05/21 1327 (!) 107     Resp 03/05/21 1327 (!) 24     Temp 03/05/21 1327 97.9 F (36.6 C)     Temp Source 03/05/21 1327 Oral     SpO2 03/05/21 1327 91 %     Weight 03/05/21 1329 135 lb (61.2 kg)     Height 03/05/21 1329 5' (1.524 m)     Head Circumference --      Peak Flow --      Pain Score 03/05/21 1329 10     Pain Loc --      Pain Edu? --      Excl. in [place]? --     Constitutional: Alert and oriented. Well appearing and in no acute distress. Eyes: Conjunctivae are normal. No swelling around eyes Head: Atraumatic. Nose: No congestion/rhinnorhea. Mouth/Throat: Mucous membranes are moist.   Neck: No stridor. Trachea Midline. FROM Cardiovascular: Normal rate, no swelling noted Respiratory: No increased wob, no stridor Gastrointestinal: Soft and nontender. No distention.  No abdominal bruits.  Musculoskeletal: No lower extremity tenderness nor edema.  No joint effusions. Neurologic:  Normal speech and language. No gross focal neurologic deficits are appreciated.  Good strength in her legs.  Patient able to ambulate. Skin:  Skin is warm, dry and intact. No rash noted. Psychiatric: Positive SI with thoughts of self-harm Patient reports chronic back pain along her entire spine GU: Deferred   ____________________________________________   LABS (all labs ordered are listed, but only abnormal results are displayed)  Labs Reviewed  COMPREHENSIVE METABOLIC PANEL - Abnormal; Notable for the following components:      Result Value   Sodium 132 (*)    Chloride 97 (*)    Glucose, Bld 111 (*)    All other components within normal limits  SALICYLATE LEVEL - Abnormal; Notable for the following components:   Salicylate Lvl [id number] (*)    All other components within normal limits  ACETAMINOPHEN LEVEL - Abnormal; Notable for the following components:   Acetaminophen (Tylenol), Serum <10 (*)    All  other components within normal limits  RESP PANEL BY RT-PCR (FLU A&B, COVID) ARPGX2  ETHANOL  CBC  URINE DRUG SCREEN, QUALITATIVE ([facility] ONLY)  TSH  T4, FREE   ____________________________________________    PROCEDURES  Procedure(s) performed (including Critical Care):  Procedures   ____________________________________________   INITIAL IMPRESSION / ASSESSMENT AND PLAN / ED COURSE  Lashante Gibbon was evaluated in Emergency Department on 03/05/2021 for the symptoms described in the history of present illness. She was evaluated in the context of the global COVID-19 pandemic, which necessitated consideration that the patient might be at risk for infection with the SARS-CoV-2 virus that causes COVID-19. Institutional protocols and algorithms that pertain to the evaluation of patients at risk for COVID-19 are in a state of rapid change based on information released by regulatory bodies including the CDC and federal and state organizations. These policies and algorithms were followed during the patient's care in the ED.    Pt is without any acute medical complaints.  Patient does report chronic back pain but is been followed by pain management for this has had recent MRIs without any new evidence of cord compression and no new trauma to suggest needing repeat imaging.  Given that patient stating that she wants to leave I will IVC patient due to my concern that patient had SI with a plan.  Patient does have slightly low oxygen levels at 91% but does report COPD we will restart her home medications.  Will have pharmacy review her medications and then restart them.   No exam findings to suggest medical cause of current presentation. Will order psychiatric screening labs and discuss further w/ psychiatric service.  D/d includes but is not limited to psychiatric disease, behavioral/personality disorder, inadequate socioeconomic support, medical.  Based on HPI, exam, unremarkable labs, no concern  for acute medical problem at this time. No rigidity, clonus, hyperthermia, focal neurologic deficit, diaphoresis, tachycardia, meningismus, ataxia, gait abnormality or other finding to suggest this visit represents a non-psychiatric problem. Screening labs reviewed.    Given this, pt medically cleared, to be dispositioned per Psych.    The patient has been placed in psychiatric observation due to the need to provide a safe environment for the patient while obtaining psychiatric consultation and evaluation, as well as ongoing medical and medication management to treat the patient's condition.  The patient has been placed under full IVC at this time.   5:26 PM patient was reportedly getting upset  and feeling very anxious.  Offered her home dose of Ativan but patient declined stating that she think she is having a reaction to that.  Therefore offered her Benadryl instead.  I do feel the patient needs to be under IVC due to my concern that patient came in with SI with a plan therefore do not feel like it is safe to discharge her home at this time and patient remained under IVC.  Patient was noted to have a little bit of wheezing upon examination and oxygen levels have been ranging between 88 and 92%.  I did review in the last few time she has been to the emergency room they have been running this low.  I offered her a repeat chest x-ray but she declined stating that symptoms her oxygen levels are low when she is got a lot of drainage.  We will give her some duo nebs but suspect that this is a chronic problem from her COPD.       ____________________________________________   FINAL CLINICAL IMPRESSION(S) / ED DIAGNOSES   Final diagnoses:  Chronic back pain, unspecified back location, unspecified back pain laterality  Episode of recurrent major depressive disorder, unspecified depression episode severity ([place])  Anxiety      MEDICATIONS GIVEN DURING THIS VISIT:  Medications  lidocaine  (LIDODERM) 5 % 1 patch (1 patch Transdermal Patch Applied 03/05/21 1536)  acetaminophen (TYLENOL) tablet 1,000 mg (1,000 mg Oral Given 03/05/21 1508)      acetaminophen (TYLENOL) tablet 1,000 mg (1,000 mg Oral Given 03/05/21 1508)     ED Discharge Orders    None       Note:  This document was prepared using Dragon voice recognition software and may include unintentional dictation errors.   Vanessa Deerfield, MD 03/05/21 1647    Vanessa Comanche, MD 03/05/21 1727    Vanessa [place], MD 03/05/21 2031

## 2021-03-05 NOTE — ED Notes (Signed)
Snack and beverage given. 

## 2021-03-05 NOTE — ED Notes (Signed)
Pt refused to take PO Ativan.  "No, I can't take that. That is what started all this."  I need the phone so I can call my lawyer and go home."

## 2021-03-05 NOTE — ED Triage Notes (Signed)
Pt c/o having suicidal thoughts today, increased anxiety, states she has not been sleeping due to chronic pain with her neck and back.

## 2021-03-06 NOTE — ED Notes (Signed)
Hourly rounding reveals patient in room. No complaints, stable, in no acute distress. Q15 minute rounds and monitoring via Rover and Officer to continue.   

## 2021-03-06 NOTE — ED Notes (Signed)
Called [facility] for transport to [facility]  [phone]

## 2021-03-06 NOTE — ED Notes (Signed)
Pt states only wears O2 at night

## 2021-03-06 NOTE — ED Notes (Signed)
Attempted to call husband to update on transfer per pt request, no answer

## 2021-03-06 NOTE — ED Provider Notes (Addendum)
Emergency Medicine Observation Re-evaluation Note  Colleen [name] is a 60 y.o. female, seen on rounds today.  Pt initially presented to the ED for complaints of Anxiety and Suicidal Currently, the patient is resting.  Physical Exam  BP (!) 137/96 (BP Location: Right Arm)   Pulse (!) 106   Temp 97.9 F (36.6 C) (Oral)   Resp (!) 24   Ht 1.524 m (5')   Wt 61.2 kg   SpO2 90%   BMI 26.37 kg/m  Physical Exam Gen:  No acute distress Resp:  Breathing easily and comfortably, no accessory muscle usage Neuro:  Moving all four extremities, no gross focal neuro deficits Psych:  Resting currently, relatively calm when awake  ED Course / MDM  EKG:   I have reviewed the labs performed to date as well as medications administered while in observation.  Recent changes in the last 24 hours include initial evaluation and placement.  Plan  Current plan is for transfer to [facility] later today. Patient is under full IVC at this time.   Hinda Kehr, MD 03/06/21 7793    Hinda Kehr, MD 03/06/21 [phone]

## 2021-03-06 NOTE — ED Notes (Signed)
Report given to Christy Sartorius at [facility], states pt will be going to [facility].

## 2021-03-07 ENCOUNTER — Telehealth: Payer: Self-pay

## 2021-03-07 NOTE — Telephone Encounter (Signed)
Returned call to Ms. Debbie Crane-licensed psychotherapist at [phone] reported that patient is currently admitted to [facility] since she had some self-injurious thoughts yesterday however decided to get help.  She also likely had physiological reaction to what over stressed that she was going through which caused her to have a lot of itching even though she was not started on any new medication except for the Cymbalta dosage which was increased yesterday.  According to her therapist she has signed a release and she will coordinate care with Probation officer.  Patient does have a lot of trauma and will need continued psychotherapy sessions on a regular basis.

## 2021-03-07 NOTE — Telephone Encounter (Signed)
Shary Decamp [phone] licensed psychotherapist for patient states you wanted her to call about patients

## 2021-03-12 ENCOUNTER — Telehealth: Admitting: Student in an Organized Health Care Education/Training Program

## 2021-03-14 ENCOUNTER — Telehealth: Admitting: Student in an Organized Health Care Education/Training Program

## 2021-03-19 ENCOUNTER — Encounter: Payer: Self-pay | Admitting: Student in an Organized Health Care Education/Training Program

## 2021-03-19 ENCOUNTER — Telehealth: Payer: Self-pay | Admitting: *Deleted

## 2021-03-19 NOTE — Telephone Encounter (Signed)
Attempted to call for pre appointment review of allergies/meds. Message left. 

## 2021-03-20 ENCOUNTER — Other Ambulatory Visit: Payer: Self-pay

## 2021-03-20 ENCOUNTER — Ambulatory Visit
Attending: Student in an Organized Health Care Education/Training Program | Admitting: Student in an Organized Health Care Education/Training Program

## 2021-03-20 DIAGNOSIS — M5416 Radiculopathy, lumbar region: Secondary | ICD-10-CM

## 2021-03-20 DIAGNOSIS — G894 Chronic pain syndrome: Secondary | ICD-10-CM | POA: Diagnosis not present

## 2021-03-20 DIAGNOSIS — M5126 Other intervertebral disc displacement, lumbar region: Secondary | ICD-10-CM

## 2021-03-20 NOTE — Patient Instructions (Signed)
Consider lumbar facet blocks in July

## 2021-03-20 NOTE — Progress Notes (Signed)
Patient: Colleen [name]  Service Category: E/M  Provider: Gillis Santa, MD  DOB: 1961-07-03  DOS: 03/20/2021  Location: Office  MRN: [id number]  Setting: Ambulatory outpatient  Referring Provider: Remi Haggard, FNP  Type: Established Patient  Specialty: Interventional Pain Management  PCP: Remi Haggard, FNP  Location: Home  Delivery: TeleHealth     Virtual Encounter - Pain Management PROVIDER NOTE: Information contained herein reflects review and annotations entered in association with encounter. Interpretation of such information and data should be left to medically-trained personnel. Information provided to patient can be located elsewhere in the medical record under "Patient Instructions". Document created using STT-dictation technology, any transcriptional errors that may result from process are unintentional.    Contact & Pharmacy Preferred: [phone] Home: [phone] (home) Mobile: [phone] (mobile) E-mail: [email or web address]  [place], [place] [address] Phone: [phone] Fax: [phone]   Pre-screening  Colleen [name] offered "in-person" vs "virtual" encounter. She indicated preferring virtual for this encounter.   Reason COVID-19*  Social distancing based on CDC and AMA recommendations.   I contacted Colleen [name] on 03/20/2021 via video conference.      I clearly identified myself as Gillis Santa, MD. I verified that I was speaking with the correct person using two identifiers (Name: Shakiyah Cirilo, and date of birth: September 22, 1961).  Consent I sought verbal advanced consent from Colleen [name] for virtual visit interactions. I informed Colleen [name] of possible security and privacy concerns, risks, and limitations associated with providing "not-in-person" medical evaluation and management services. I also informed Colleen [name] of the availability of "in-person" appointments. Finally, I  informed her that there would be a charge for the virtual visit and that she could be  personally, fully or partially, financially responsible for it. Colleen [name] expressed understanding and agreed to proceed.   Historic Elements   Colleen [name] is a 60 y.o. year old, female patient evaluated today after our last contact on 01/22/2021. Colleen [name]  has a past medical history of Anxiety, Asthma, Breast cancer ([place]) (2004), Chronic back pain, COPD (chronic obstructive pulmonary disease) ([place]), Depression, and Personal history of radiation therapy. She also  has a past surgical history that includes Nephrectomy (Left); Mastectomy (Left); Tubal ligation; Mastectomy; Breast biopsy (Left, 2004); Breast biopsy ([name]); and Breast lumpectomy (Left, 2004). Colleen [name] has a current medication list which includes the following prescription(s): aspirin ec, cetirizine-pseudoephedrine, cholecalciferol, denosumab, duloxetine, epinephrine, trelegy ellipta, montelukast, and ventolin hfa. She  reports that she has been smoking cigarettes. She has been smoking about 0.50 packs per day. She has never used smokeless tobacco. She reports current alcohol use. She reports that she does not use drugs. Colleen [name] is allergic to desvenlafaxine, morphine and related, penicillins, prazosin, tamoxifen, trazodone, clonazepam, codeine, hydroxyzine, paroxetine hcl, and sulfa antibiotics.   HPI  Today, she is being contacted for follow-up evaluation  Patient states that she had an emergency department visit for suicidal ideation, worsening depression and anxiety.  This was related to her chronic pain.  She started psychotherapy approximately 1 month ago and is working with Dr. EAP [name].  She has been having increased low back pain.  For this we discussed repeating lumbar epidural steroid injection which she has received benefit from, her last 1 being 12/19/2020.  She states that she will contact us in July to hopefully get this scheduled.  I  encouraged her to continue her appointments with psychiatry.  Continue Cymbalta 30 mg twice daily.  Patient consents to safety and states that she is in a better place now.  Laboratory Chemistry Profile   Renal Lab Results  Component Value Date   BUN 6 03/05/2021   CREATININE 0.47 03/05/2021   GFRAA >60 03/21/2020   GFRNONAA >60 03/05/2021     Hepatic Lab Results  Component Value Date   AST 21 03/05/2021   ALT 13 03/05/2021   ALBUMIN 4.3 03/05/2021   ALKPHOS 39 03/05/2021   LIPASE 24 10/16/2020     Electrolytes Lab Results  Component Value Date   NA 132 (L) 03/05/2021   K 4.5 03/05/2021   CL 97 (L) 03/05/2021   CALCIUM 9.1 03/05/2021     Bone No results found for: VD25OH, [id number], PQ9826EB5, AX0940HW8, 25OHVITD1, 25OHVITD2, 25OHVITD3, TESTOFREE, TESTOSTERONE   Inflammation (CRP: Acute Phase) (ESR: Chronic Phase) Lab Results  Component Value Date   LATICACIDVEN 0.78 10/11/2018       Note: Above Lab results reviewed.  Imaging  DG Chest 2 View CLINICAL DATA:  Shortness of breath  EXAM: CHEST - 2 VIEW  COMPARISON:  01/20/2021  FINDINGS: Cardiomediastinal contours are within normal limits. Chronic interstitial prominence with left basilar scarring. No superimposed airspace opacity. No pleural effusion or pneumothorax. No acute osseous findings.  IMPRESSION: No active cardiopulmonary disease.  Electronically Signed   By: Davina Poke D.O.   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 Diagnoses of Lumbar radiculopathy and Chronic pain syndrome were also pertinent to this visit.  Plan of Care  Colleen [name] has a current medication list which includes the following long-term medication(s): duloxetine, montelukast, and ventolin hfa.  Orders:  Orders Placed This Encounter  Procedures  . Lumbar Epidural Injection    Standing Status:   Standing    Number of Occurrences:   9     Standing Expiration Date:   03/21/2022    Scheduling Instructions:     Purpose: Palliative     Indication: Lower extremity pain/Sciatica unspecified side (M54.30).     Side: Midline     Level: TBD     Sedation: Patient's choice.     TIMEFRAME: PRN procedure. (Colleen [name] will call when needed.)    Order Specific Question:   Where will this procedure be performed?    Answer:   [facility]   Follow-up plan:   Return if symptoms worsen or fail to improve.     Status post [name] L4-L5 ESI #1 on 06/11/2020, #2 10/22/20, bilateral C3, C4, C5 cervical facet medial branch nerve block 07/25/2020 with thoracic TPI: only helped for 24 hrs        Recent Visits Date Type Provider Dept  03/20/21 Telemedicine Gillis Santa, MD [facility]  01/17/21 Telemedicine Gillis Santa, MD [facility]  Showing recent visits within past 90 days and meeting all other requirements Future Appointments No visits were found meeting these conditions. Showing future appointments within next 90 days and meeting all other requirements  I discussed the assessment and treatment plan with the patient. The patient was provided an opportunity to ask questions and all were answered. The patient agreed with the plan and demonstrated an understanding of the instructions.  Patient advised to call back or seek an in-person evaluation if the symptoms or condition worsens.  Duration of encounter: 61mnutes.  Note by: BGillis Santa MD Date: 03/20/2021; Time: 10:10 AM

## 2021-03-21 ENCOUNTER — Other Ambulatory Visit: Payer: Self-pay

## 2021-03-21 ENCOUNTER — Telehealth (INDEPENDENT_AMBULATORY_CARE_PROVIDER_SITE_OTHER): Admitting: Psychiatry

## 2021-03-21 ENCOUNTER — Encounter: Payer: Self-pay | Admitting: Student in an Organized Health Care Education/Training Program

## 2021-03-21 ENCOUNTER — Encounter: Payer: Self-pay | Admitting: Psychiatry

## 2021-03-21 DIAGNOSIS — F331 Major depressive disorder, recurrent, moderate: Secondary | ICD-10-CM

## 2021-03-21 DIAGNOSIS — F4310 Post-traumatic stress disorder, unspecified: Secondary | ICD-10-CM | POA: Diagnosis not present

## 2021-03-21 DIAGNOSIS — F411 Generalized anxiety disorder: Secondary | ICD-10-CM | POA: Diagnosis not present

## 2021-03-21 DIAGNOSIS — G4701 Insomnia due to medical condition: Secondary | ICD-10-CM | POA: Diagnosis not present
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
## 2021-04-02 ENCOUNTER — Telehealth: Payer: Self-pay

## 2021-04-02 NOTE — Telephone Encounter (Signed)
Jess please call her and put her on my schedule for tomorrow at 2:40 PM - squeeze her in please. Thank you.

## 2021-04-02 NOTE — Telephone Encounter (Signed)
pt had left a message that even with the cymbalta she is having panic attacks

## 2021-04-03 ENCOUNTER — Other Ambulatory Visit: Payer: Self-pay

## 2021-04-03 ENCOUNTER — Telehealth (INDEPENDENT_AMBULATORY_CARE_PROVIDER_SITE_OTHER): Admitting: Psychiatry

## 2021-04-03 ENCOUNTER — Encounter: Payer: Self-pay | Admitting: Psychiatry

## 2021-04-03 DIAGNOSIS — G4701 Insomnia due to medical condition: Secondary | ICD-10-CM | POA: Diagnosis not present

## 2021-04-03 DIAGNOSIS — F331 Major depressive disorder, recurrent, moderate: Secondary | ICD-10-CM

## 2021-04-03 DIAGNOSIS — F4310 Post-traumatic stress disorder, unspecified: Secondary | ICD-10-CM | POA: Diagnosis not present

## 2021-04-03 DIAGNOSIS — F411 Generalized anxiety disorder: Secondary | ICD-10-CM

## 2021-04-03 MED ORDER — GABAPENTIN 100 MG PO CAPS
200.0000 mg | ORAL_CAPSULE | Freq: Every day | ORAL | 1 refills | Status: DC
Start: 2021-04-03 — End: 2021-05-02

## 2021-04-03 MED ORDER — DULOXETINE HCL 30 MG PO CPEP: 30.0000 mg | ORAL_CAPSULE | ORAL | 1 refills | Status: DC

## 2021-04-03 NOTE — Patient Instructions (Signed)

## 2021-04-03 NOTE — Progress Notes (Signed)
Virtual Visit via Video Note  I connected with Colleen [name] on 04/03/21 at  3:20 PM EDT by a video enabled telemedicine application and verified that I am speaking with the correct person using two identifiers.  Location Provider Location : [facility] Patient Location : Home  Participants: Patient , Provider   I discussed the limitations of evaluation and management by telemedicine and the availability of in person appointments. The patient expressed understanding and agreed to proceed.    I discussed the assessment and treatment plan with the patient. The patient was provided an opportunity to ask questions and all were answered. The patient agreed with the plan and demonstrated an understanding of the instructions.   The patient was advised to call back or seek an in-person evaluation if the symptoms worsen or if the condition fails to improve as anticipated.   [place] MD OP Progress Note  04/03/2021 3:00 PM Colleen [name]  MRN:  [id number]  Chief Complaint:  Chief Complaint    Follow-up; Anxiety     HPI: Colleen [name] is a 60 year old Caucasian female, married, has a history of PTSD, MDD, GAD, insomnia, chronic back pain, COPD, personal history of radiation therapy, past surgical history of nephrectomy, mastectomy, tubal ligation was evaluated by telemedicine today.  Patient contacted the office reporting worsening anxiety symptoms and hence was evaluated today.  Patient today reports her anxiety worsened over the weekend.  She reports she felt like she had chest pressure, had trouble breathing and had a lot of racing thoughts, could not [name] at night.  She reports she felt bad on Monday also however that yesterday her anxiety started getting a bit better.  She reports she does have psychosocial stressors of her husband's father who passed away recently in [place].  Her husband hence has left for the next 2 to 3 weeks.  This could be causing her anxiety to be worse.  She does not have a  lot of social support at this time.  She however agrees to go to the nearest emergency department or call 911 if she needs it.  She also has a good therapeutic relationship with her therapist and has upcoming appointment.  Patient is compliant on medications.  She denies side effects to Cymbalta and wonders whether she can increase the dosage.  Patient denies any suicidality, homicidality or perceptual disturbances.  Patient denies any other concerns today.  Visit Diagnosis:    ICD-10-CM   1. MDD (major depressive disorder), recurrent episode, moderate (HCC)  F33.1 DULoxetine (CYMBALTA) 30 MG capsule  2. GAD (generalized anxiety disorder)  F41.1 DULoxetine (CYMBALTA) 30 MG capsule    gabapentin (NEURONTIN) 100 MG capsule  3. PTSD (post-traumatic stress disorder)  F43.10 DULoxetine (CYMBALTA) 30 MG capsule  4. Insomnia due to medical condition  G47.01    Pain, COPD    Past Psychiatric History: I have reviewed past psychiatric history from progress note on 03/04/2021.  Past trials of Paxil, Klonopin, trazodone, prazosin, desvenlafaxine, lorazepam  Past Medical History:  Past Medical History:  Diagnosis Date  . Anxiety   . Asthma   . Breast cancer ([place]) 2004   left breast  . Chronic back pain   . COPD (chronic obstructive pulmonary disease) ([place])   . Depression   . Personal history of radiation therapy     Past Surgical History:  Procedure Laterality Date  . BREAST BIOPSY Left 2004   positive  . BREAST BIOPSY Right    neg  . BREAST LUMPECTOMY Left 2004  positive  . MASTECTOMY Left   . MASTECTOMY    . NEPHRECTOMY Left   . TUBAL LIGATION      Family Psychiatric History: I have reviewed family psychiatric history from progress note on 03/04/2021  Family History:  Family History  Problem Relation Age of Onset  . Breast cancer Cousin   . Breast cancer Cousin   . Bipolar disorder Daughter   . Drug abuse Daughter   . Alcohol abuse Maternal Grandfather     Social  History: Reviewed social history from progress note on 03/04/2021 Social History   Socioeconomic History  . Marital status: Married    Spouse name: Not on file  . Number of children: 2  . Years of education: GED  . Highest education level: Not on file  Occupational History  . Not on file  Tobacco Use  . Smoking status: Current Every Day Smoker    Packs/day: 0.50    Types: Cigarettes  . Smokeless tobacco: Never Used  Vaping Use  . Vaping Use: Never used  Substance and Sexual Activity  . Alcohol use: Yes  . Drug use: Never  . Sexual activity: Not Currently  Other Topics Concern  . Not on file  Social History Narrative  . Not on file   Social Determinants of Health   Financial Resource Strain: Not on file  Food Insecurity: Not on file  Transportation Needs: Not on file  Physical Activity: Not on file  Stress: Not on file  Social Connections: Not on file    Allergies:  Allergies  Allergen Reactions  . Desvenlafaxine Anaphylaxis  . Morphine And Related Anaphylaxis  . Penicillins Anaphylaxis    Has patient had a PCN reaction causing immediate rash, facial/tongue/throat swelling, SOB or lightheadedness with hypotension: Yes Has patient had a PCN reaction causing severe rash involving mucus membranes or skin necrosis: No Has patient had a PCN reaction that required hospitalization: Unknown Has patient had a PCN reaction occurring within the last 10 years: No If all of the above answers are "NO", then may proceed with Cephalosporin use.   . Prazosin Other (See Comments)  . Tamoxifen Anaphylaxis  . Trazodone Anaphylaxis  . Clonazepam   . Codeine Swelling  . Hydroxyzine Itching  . Lorazepam     Patient feels this medications makes her to sedated and wants to avoid  . Paroxetine Hcl Hives  . Sulfa Antibiotics Itching  . Cephalexin Rash    Metabolic Disorder Labs: Lab Results  Component Value Date   HGBA1C 5.4 10/12/2018   MPG 108.28 10/12/2018   No results found  for: PROLACTIN No results found for: CHOL, TRIG, HDL, CHOLHDL, VLDL, LDLCALC Lab Results  Component Value Date   TSH 0.616 03/05/2021    Therapeutic Level Labs: No results found for: LITHIUM No results found for: VALPROATE No components found for:  CBMZ  Current Medications: Current Outpatient Medications  Medication Sig Dispense Refill  . DULoxetine (CYMBALTA) 30 MG capsule Take 1-2 capsules (30-60 mg total) by mouth as directed. Start taking 1 cap daily AM and 2 cap in the evening 90 capsule 1  . gabapentin (NEURONTIN) 100 MG capsule Take 2 capsules (200 mg total) by mouth at bedtime. 60 capsule 1  . aspirin EC 81 MG tablet Take 81 mg by mouth daily.    . cetirizine-pseudoephedrine (ZYRTEC-D) 5-120 MG tablet Take 1 tablet by mouth 2 (two) times daily as needed for allergies.    . Cholecalciferol 25 MCG (1000 UT) tablet Take by  mouth.    . denosumab (PROLIA) 60 MG/ML SOSY injection Inject 60 mg into the skin every 6 (six) months.    . DULoxetine (CYMBALTA) 30 MG capsule Take 1 capsule (30 mg total) by mouth 2 (two) times daily. 180 capsule 0  . EPINEPHrine 0.3 mg/0.3 mL IJ SOAJ injection Inject 0.3 mg into the muscle as needed.    . Fluticasone-Umeclidin-Vilant (TRELEGY ELLIPTA) 100-62.5-25 MCG/INH AEPB Inhale 1 puff into the lungs daily.    . montelukast (SINGULAIR) 10 MG tablet Take 10 mg by mouth at bedtime.    . VENTOLIN HFA 108 (90 Base) MCG/ACT inhaler Inhale 1-2 puffs into the lungs every 4 (four) hours as needed for cough or wheezing.     No current facility-administered medications for this visit.     Musculoskeletal: Strength & Muscle Tone: UTA Gait & Station: UTA Patient leans: N/A  Psychiatric Specialty Exam: Review of Systems  Musculoskeletal: Positive for neck pain.  Psychiatric/Behavioral: Positive for [name] disturbance. The patient is nervous/anxious.   All other systems reviewed and are negative.   There were no vitals taken for this visit.There is no  height or weight on file to calculate BMI.  General Appearance: Casual  Eye Contact:  Fair  Speech:  Clear and Coherent  Volume:  Normal  Mood:  Anxious  Affect:  Congruent  Thought Process:  Goal Directed and Descriptions of Associations: Intact  Orientation:  Full (Time, Place, and Person)  Thought Content: Logical   Suicidal Thoughts:  No  Homicidal Thoughts:  No  Memory:  Immediate;   Fair Recent;   Fair Remote;   Fair  Judgement:  Fair  Insight:  Fair  Psychomotor Activity:  Normal  Concentration:  Concentration: Fair and Attention Span: Fair  Recall:  [employer] of Knowledge: Fair  Language: Fair  Akathisia:  No  Handed:  Right  AIMS (if indicated): UTA  Assets:  Communication Skills Desire for Improvement Housing Social Support  ADL's:  Intact  Cognition: WNL  [name]:  Poor   Screenings: GAD-7   Flowsheet Row Video Visit from 04/03/2021 in [facility] Video Visit from 03/04/2021 in [facility] [facility]  Total GAD-7 Score 6 17    PHQ2-9   Flowsheet Row Video Visit from 04/03/2021 in [facility] Video Visit from 03/04/2021 in [facility] Procedure visit from 12/19/2020 in [place] [place] Office Visit from 08/16/2020 in [facility] Procedure visit from 07/25/2020 in [place]  PHQ-2 Total Score 0 6 0 0 0  PHQ-9 Total Score -- 20 -- -- --    Flowsheet Row Video Visit from 03/21/2021 in [facility] ED from 03/05/2021 in [facility] ED from 03/03/2021 in [place] No Risk Error: Q3, 4, or 5 should not be populated when Q2 is No No Risk       Assessment and Plan: Delorse Shane is a 60 year old Caucasian female,  unemployed, married, has a history of anxiety, PTSD, chronic pain, multiple other medical problems was evaluated by telemedicine today.  Patient with psychosocial stressors of chronic pain, recent death in the family.  Patient is currently struggling with anxiety, [name] and will benefit from medication management and psychotherapy sessions.  Plan  MDD-improving Cymbalta as prescribed Continue CBT with Ms. Shary Decamp with [facility]  GAD-unstable Increase Cymbalta  to 90 mg p.o. daily Start gabapentin 200 mg p.o. nightly.  Patient advised to start it a week after going up on the Cymbalta to 90 mg.   PTSD-improving Cymbalta as prescribed. Continue CBT  Insomnia likely due to medical condition like pain-unstable Advised patient to have sufficient pain management to also address her [name].  She will contact her primary care provider. Gabapentin will also help with [name]. Patient to continue CBT.  Pending Genesight testing report.  Follow-up in clinic in 2 weeks or sooner if needed.  This note was generated in part or whole with voice recognition software. Voice recognition is usually quite accurate but there are transcription errors that can and very often do occur. I apologize for any typographical errors that were not detected and corrected.      Ursula Alert, MD 04/05/2021, 9:04 AM

## 2021-04-29 ENCOUNTER — Telehealth: Payer: Self-pay

## 2021-04-29 NOTE — Telephone Encounter (Signed)
Returned call to patient.  She reports she had gone out and felt anxiety got worse at that time. It comes and goes and currently a bit better. She does not want medication changes right now. Is ok with being evaluated on Thursday IN OFFICE for further med management as needed.

## 2021-04-29 NOTE — Telephone Encounter (Signed)
pt is having some really bad panic attacks

## 2021-05-01 ENCOUNTER — Ambulatory Visit
Admission: RE | Admit: 2021-05-01 | Discharge: 2021-05-01 | Disposition: A | Discharge status: Home / Self Care | Source: Ambulatory Visit | Attending: Family Medicine | Admitting: Family Medicine

## 2021-05-01 ENCOUNTER — Other Ambulatory Visit: Payer: Self-pay

## 2021-05-01 DIAGNOSIS — Z1231 Encounter for screening mammogram for malignant neoplasm of breast: Secondary | ICD-10-CM | POA: Diagnosis not present

## 2021-05-01 IMAGING — MG MM DIGITAL SCREENING BILAT W/ TOMO AND CAD
6 of 10 series · 6 of 30 positions shown · non-contrast
Comparison: Previous exam(s).

CLINICAL DATA: Screening. LEFT lumpectomy [N8]

EXAM:
DIGITAL SCREENING BILATERAL MAMMOGRAM WITH TOMOSYNTHESIS AND CAD
TECHNIQUE: Bilateral screening digital craniocaudal and mediolateral oblique
mammograms were obtained. Bilateral screening digital breast
tomosynthesis was performed. The images were evaluated with
computer-aided detection.

[L CC synth-2D]
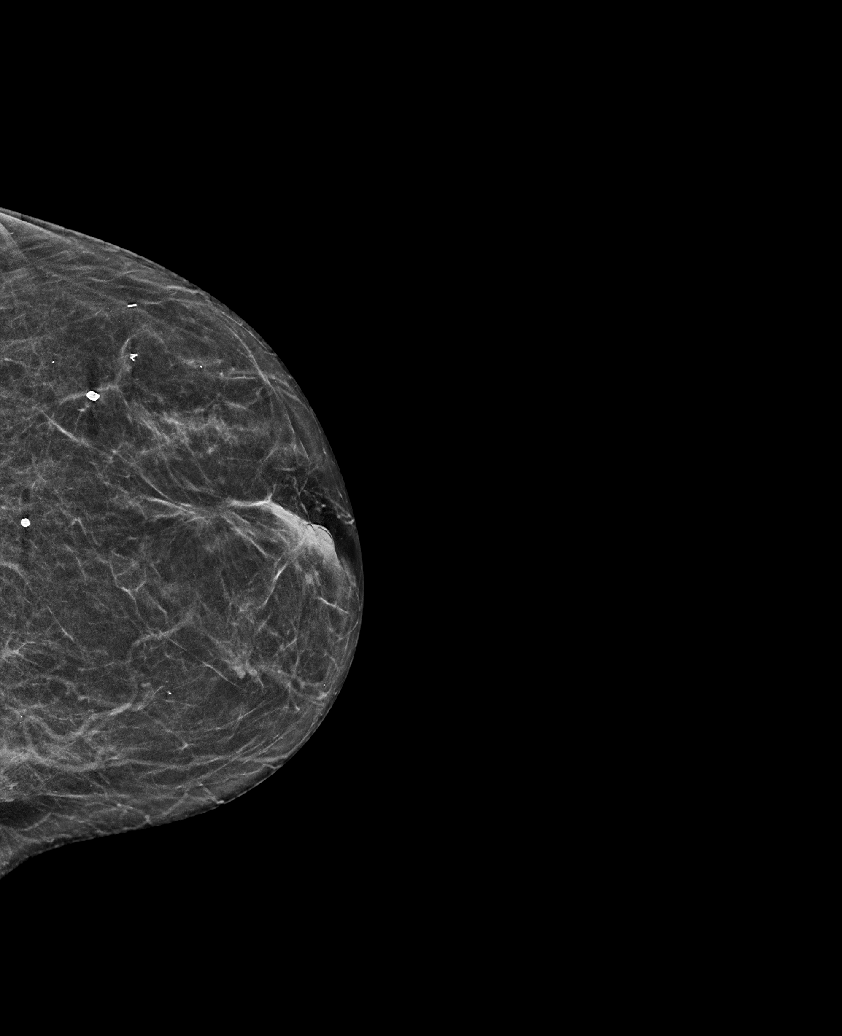

[R CC synth-2D]
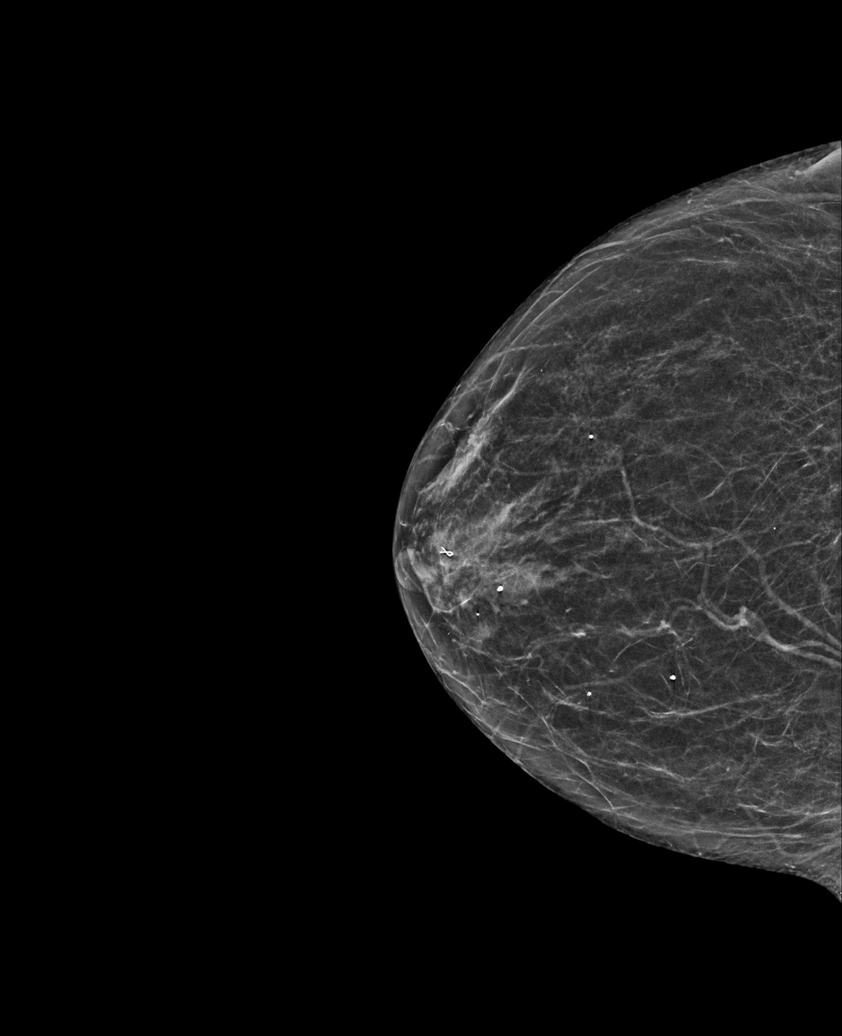

[R MLO synth-2D]
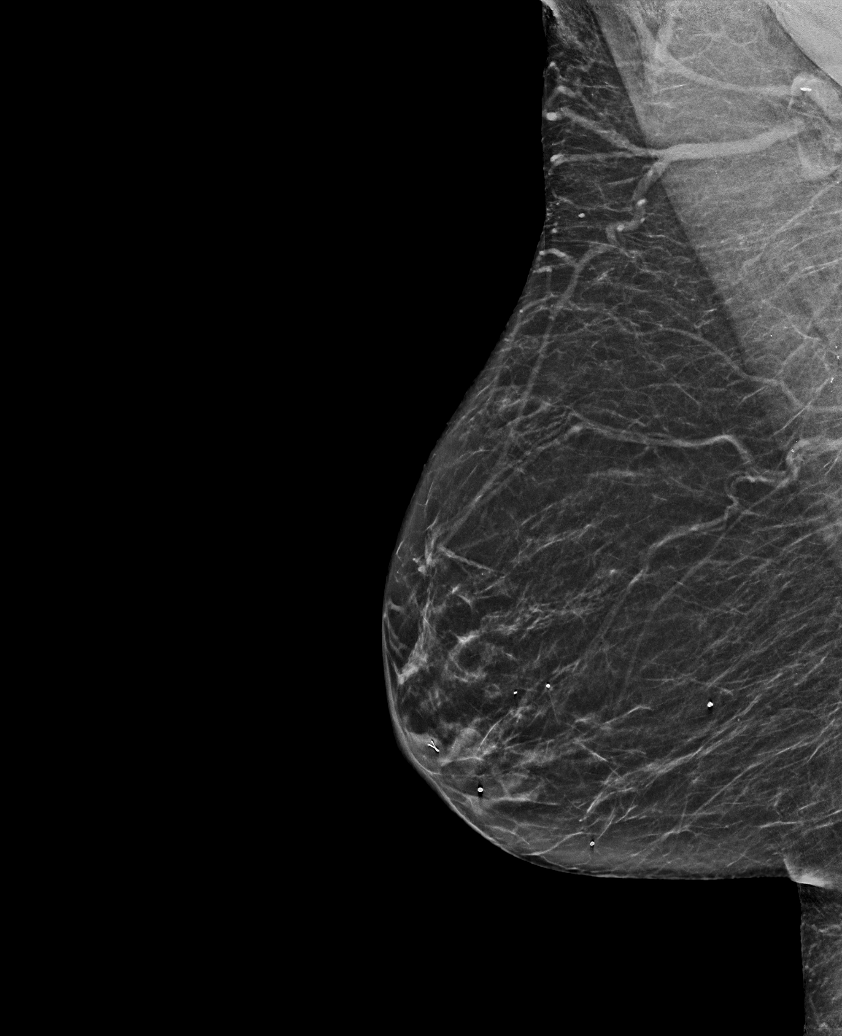

[L MLO synth-2D (1 of 2)]
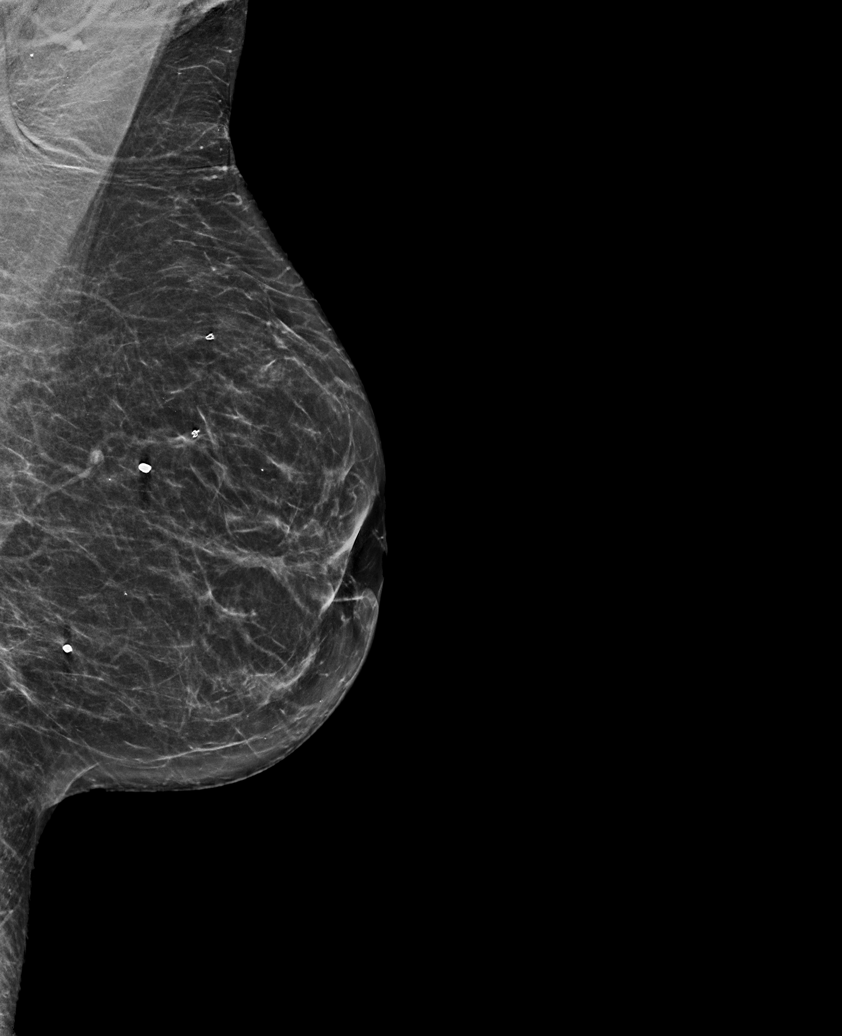

[L MLO synth-2D (2 of 2)]
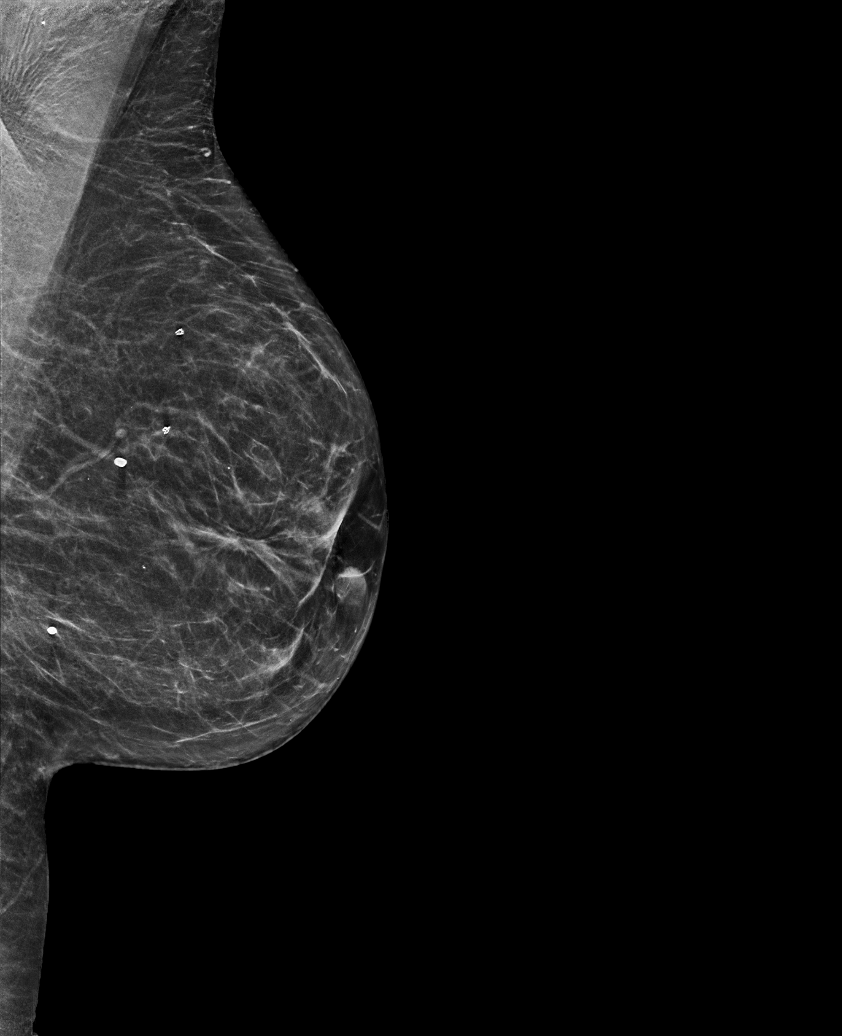

[L CC tomo · tomo slice 27/52.0]
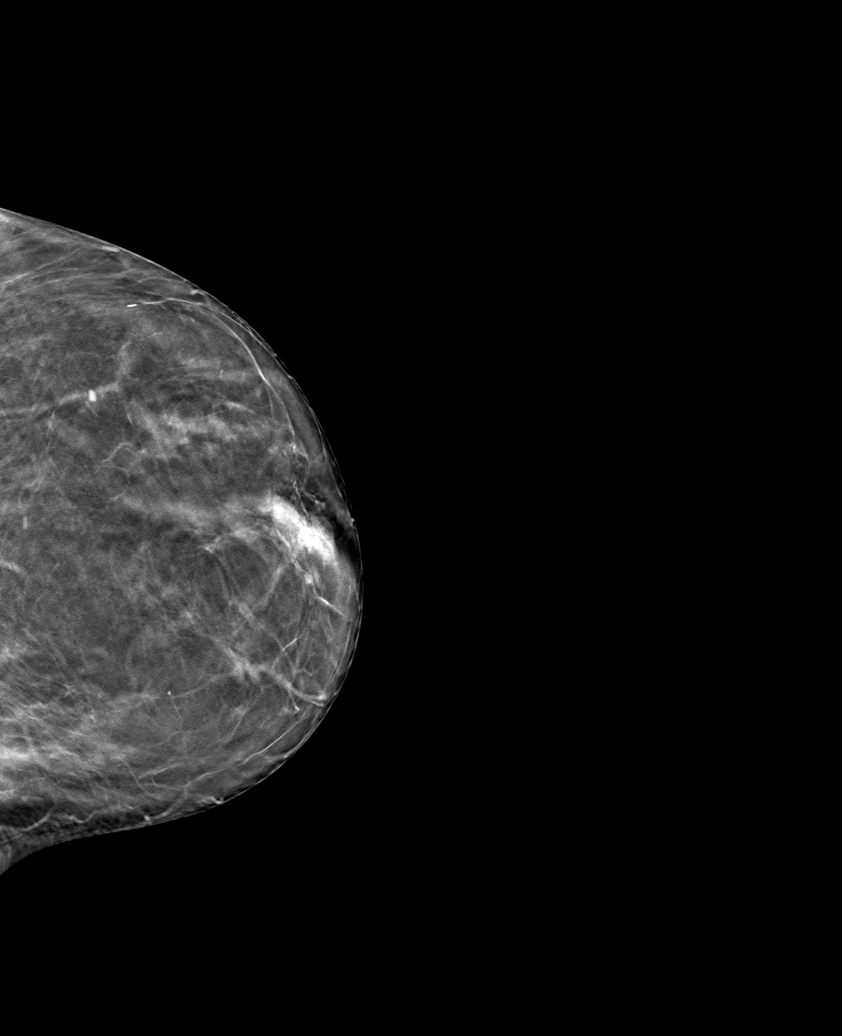

[6 of 30 positions shown; findings below may reference images not displayed]

ACR Breast Density Category b: There are scattered areas of
fibroglandular density.
FINDINGS: There are no findings suspicious for malignancy. There is density
and architectural distortion within the LEFT breast, consistent with
postsurgical changes. These are stable in comparison to prior.
IMPRESSION: No mammographic evidence of malignancy. A result letter of this
screening mammogram will be mailed directly to the patient.

RECOMMENDATION:
Screening mammogram in one year. (Code:[N8])

BI-RADS CATEGORY  2: Benign.

## 2021-05-02 ENCOUNTER — Ambulatory Visit (INDEPENDENT_AMBULATORY_CARE_PROVIDER_SITE_OTHER): Admitting: Psychiatry

## 2021-05-02 ENCOUNTER — Encounter: Payer: Self-pay | Admitting: Psychiatry

## 2021-05-02 ENCOUNTER — Telehealth: Admitting: Psychiatry

## 2021-05-02 VITALS — BP 136/78 | HR 116 | Temp 97.2°F | Wt 134.8 lb

## 2021-05-02 DIAGNOSIS — G4701 Insomnia due to medical condition: Secondary | ICD-10-CM | POA: Diagnosis not present

## 2021-05-02 DIAGNOSIS — F411 Generalized anxiety disorder: Secondary | ICD-10-CM

## 2021-05-02 DIAGNOSIS — F4310 Post-traumatic stress disorder, unspecified: Secondary | ICD-10-CM | POA: Diagnosis not present

## 2021-05-02 DIAGNOSIS — F331 Major depressive disorder, recurrent, moderate: Secondary | ICD-10-CM | POA: Diagnosis not present

## 2021-05-02 MED ORDER — GABAPENTIN 300 MG PO CAPS: 300.0000 mg | ORAL_CAPSULE | Freq: Two times a day (BID) | ORAL | 1 refills | Status: DC

## 2021-05-02 NOTE — Progress Notes (Signed)
[facility] MD OP Progress Note  05/02/2021 7:03 PM Colleen [name]  MRN:  [id number]  Chief Complaint:  Chief Complaint   Follow-up; Anxiety    HPI: Colleen [name] is a 60 year old Caucasian female, married, lives in [place] has a history of PTSD, MDD, insomnia, chronic back pain, COPD, radiation therapy per history, past surgical history of nephrectomy, mastectomy, tubal ligation was evaluated in office today.  Patient today reports she is currently struggling with mood symptoms like anxiety, irritability, sadness mostly because of her situational stressors.  Patient reports she is having relationship struggles with her husband.  She has been married to him since the past 33 years.  Patient reports there is no communication in the marriage and she feels as though he ignores her and does not consider her at all.  Patient reports she mostly stays home and has no social interaction or outlets.  This does make her anxious and depressed.  Patient currently denies any suicidality.  She reports sleep is improving however she does have pain which does have an impact on her sleep.  Patient is compliant on medications, denies side effects.  She is currently in psychotherapy sessions with a therapist and reports therapy sessions are beneficial.  Patient is not interested in family therapy since she feels she tried it in the past and it did not help much.  Patient reports she is trying to start [place] meetings since she was raised in an alcoholic family.  She reports she is also interested in joining a church in order to have more social interaction.  Patient denies any other concerns today.  Visit Diagnosis:    ICD-10-CM   1. MDD (major depressive disorder), recurrent episode, moderate (HCC)  F33.1 gabapentin (NEURONTIN) 300 MG capsule    2. GAD (generalized anxiety disorder)  F41.1 gabapentin (NEURONTIN) 300 MG capsule    3. PTSD (post-traumatic stress disorder)  F43.10     4. Insomnia due to medical  condition  G47.01 gabapentin (NEURONTIN) 300 MG capsule   pain, anxiety,copd      Past Psychiatric History: I have reviewed past psychiatric history from progress note on 03/04/2021.  Past trials of Paxil, Klonopin, trazodone, prazosin, desvenlafaxine, lorazepam  Past Medical History:  Past Medical History:  Diagnosis Date   Anxiety    Asthma    Breast cancer ([place]) 2004   left breast   Chronic back pain    COPD (chronic obstructive pulmonary disease) (HCC)    Depression    Personal history of radiation therapy     Past Surgical History:  Procedure Laterality Date   BREAST BIOPSY Left 2004   positive   BREAST BIOPSY Right    neg   BREAST LUMPECTOMY Left 2004   positive   MASTECTOMY Left    MASTECTOMY     NEPHRECTOMY Left    TUBAL LIGATION      Family Psychiatric History: Reviewed family psychiatric history from progress note on 03/04/2021  Family History:  Family History  Problem Relation Age of Onset   Breast cancer Cousin    Breast cancer Cousin    Bipolar disorder Daughter    Drug abuse Daughter    Alcohol abuse Maternal Grandfather     Social History: Reviewed social history from progress note on 03/04/2021 Social History   Socioeconomic History   Marital status: Married    Spouse name: Not on file   Number of children: 2   Years of education: GED   Highest education level: Not on file  Occupational History   Not on file  Tobacco Use   Smoking status: Every Day    Packs/day: 0.50    Pack years: 0.00    Types: Cigarettes   Smokeless tobacco: Never  Vaping Use   Vaping Use: Never used  Substance and Sexual Activity   Alcohol use: Yes   Drug use: Never   Sexual activity: Not Currently  Other Topics Concern   Not on file  Social History Narrative   Not on file   Social Determinants of Health   Financial Resource Strain: Not on file  Food Insecurity: Not on file  Transportation Needs: Not on file  Physical Activity: Not on file  Stress: Not on  file  Social Connections: Not on file    Allergies:  Allergies  Allergen Reactions   Desvenlafaxine Anaphylaxis   Morphine And Related Anaphylaxis   Penicillins Anaphylaxis    Has patient had a PCN reaction causing immediate rash, facial/tongue/throat swelling, SOB or lightheadedness with hypotension: Yes Has patient had a PCN reaction causing severe rash involving mucus membranes or skin necrosis: No Has patient had a PCN reaction that required hospitalization: Unknown Has patient had a PCN reaction occurring within the last 10 years: No If all of the above answers are "NO", then may proceed with Cephalosporin use.    Prazosin Other (See Comments)   Tamoxifen Anaphylaxis   Trazodone Anaphylaxis   Clonazepam    Codeine Swelling   Hydroxyzine Itching   Lorazepam     Patient feels this medications makes her to sedated and wants to avoid   Paroxetine Hcl Hives   Sulfa Antibiotics Itching   Cephalexin Rash    Metabolic Disorder Labs: Lab Results  Component Value Date   HGBA1C 5.4 10/12/2018   MPG 108.28 10/12/2018   No results found for: PROLACTIN No results found for: CHOL, TRIG, HDL, CHOLHDL, VLDL, LDLCALC Lab Results  Component Value Date   TSH 0.616 03/05/2021    Therapeutic Level Labs: No results found for: LITHIUM No results found for: VALPROATE No components found for:  CBMZ  Current Medications: Current Outpatient Medications  Medication Sig Dispense Refill   aspirin EC 81 MG tablet Take 81 mg by mouth daily.     cetirizine-pseudoephedrine (ZYRTEC-D) 5-120 MG tablet Take 1 tablet by mouth 2 (two) times daily as needed for allergies.     Cholecalciferol 25 MCG (1000 UT) tablet Take by mouth.     denosumab (PROLIA) 60 MG/ML SOSY injection Inject 60 mg into the skin every 6 (six) months.     DULoxetine (CYMBALTA) 30 MG capsule Take 1-2 capsules (30-60 mg total) by mouth as directed. Start taking 1 cap daily AM and 2 cap in the evening 90 capsule 1   EPINEPHrine  0.3 mg/0.3 mL IJ SOAJ injection Inject 0.3 mg into the muscle as needed.     Fluticasone-Umeclidin-Vilant (TRELEGY ELLIPTA) 100-62.5-25 MCG/INH AEPB Inhale 1 puff into the lungs daily.     gabapentin (NEURONTIN) 300 MG capsule Take 1 capsule (300 mg total) by mouth 2 (two) times daily. 60 capsule 1   montelukast (SINGULAIR) 10 MG tablet Take 10 mg by mouth at bedtime.     VENTOLIN HFA 108 (90 Base) MCG/ACT inhaler Inhale 1-2 puffs into the lungs every 4 (four) hours as needed for cough or wheezing.     No current facility-administered medications for this visit.     Musculoskeletal: Strength & Muscle Tone:  UTA Gait & Station: normal Patient leans: N/A  Psychiatric Specialty Exam: Review of Systems  Musculoskeletal:  Positive for back pain.  Psychiatric/Behavioral:  Positive for dysphoric mood and sleep disturbance. The patient is nervous/anxious.   All other systems reviewed and are negative.  Blood pressure 136/78, pulse (!) 116, temperature (!) 97.2 F (36.2 C), temperature source Temporal, weight 134 lb 12.8 oz (61.1 kg).Body mass index is 26.33 kg/m.  General Appearance: Casual  Eye Contact:  Fair  Speech:  Clear and Coherent  Volume:  Normal  Mood:  Anxious and Irritable  Affect:  Congruent  Thought Process:  Goal Directed and Descriptions of Associations: Intact  Orientation:  Full (Time, Place, and Person)  Thought Content: Logical   Suicidal Thoughts:  No  Homicidal Thoughts:  No  Memory:  Immediate;   Fair Recent;   Fair Remote;   Fair  Judgement:  Fair  Insight:  Fair  Psychomotor Activity:  Normal  Concentration:  Concentration: Good and Attention Span: Good  Recall:  Good  Fund of Knowledge: Good  Language: Good  Akathisia:  No  Handed:  Right  AIMS (if indicated): done  Assets:  Communication Skills Desire for Improvement Housing Talents/Skills Transportation  ADL's:  Intact  Cognition: WNL  Sleep:   Improving   Screenings: GAD-7    Flowsheet  Row Video Visit from 04/03/2021 in [facility] Video Visit from 03/04/2021 in [facility]  Total GAD-7 Score 6 17      PHQ2-9    Flowsheet Row Video Visit from 04/03/2021 in [facility] Video Visit from 03/04/2021 in [facility] Procedure visit from 12/19/2020 in [facility] Office Visit from 08/16/2020 in [facility] Procedure visit from 07/25/2020 in [facility]  PHQ-2 Total Score 0 6 0 0 0  PHQ-9 Total Score -- 20 -- -- --      Flowsheet Row Video Visit from 03/21/2021 in [facility] ED from 03/05/2021 in [facility] ED from 03/03/2021 in [facility] No Risk Error: Q3, 4, or 5 should not be populated when Q2 is No No Risk        Assessment and Plan: Mariam Helbert is a 60 year old Caucasian female, unemployed, married, has a history of anxiety, PTSD, chronic pain, multiple medical problems was evaluated in office today.  Patient with relationship struggles as well as medical problems including chronic pain continues to struggle with mood.  Discussed plan as noted below.  Plan MDD-improving Cymbalta as prescribed Continue CBT with Ms. Shary Decamp with [facility].  GAD-unstable Increase gabapentin to 300 mg p.o. twice daily Cymbalta 90 mg p.o. daily   PTSD-improving Cymbalta as prescribed Continue CBT  Insomnia likely due to medical condition like pain-improving Increase gabapentin as noted above. Continue CBT  Pending [facility] testing report.  Provided reassurance, provided resources.  Patient to start [place] meetings.  Patient also to join a church.    Follow-up in clinic in 4 weeks or sooner if  needed.  This note was generated in part or whole with voice recognition software. Voice recognition is usually quite accurate but there are transcription errors that can and very often do occur. I apologize for any typographical errors that were not detected and corrected.     Ursula Alert, MD 05/03/2021, 8:40 AM

## 2021-05-02 NOTE — Progress Notes (Deleted)
[facility] MDOP Progress Note  05/02/2021 2:11 PM Colleen [name]  MRN:  [id number]  Chief Complaint:  Chief Complaint   Follow-up    HPI: *** Visit Diagnosis: No diagnosis found.  Past Psychiatric History:   Past Medical History:  Past Medical History:  Diagnosis Date   Anxiety    Asthma    Breast cancer ([place]) 2004   left breast   Chronic back pain    COPD (chronic obstructive pulmonary disease) ([place])    Depression    Personal history of radiation therapy     Past Surgical History:  Procedure Laterality Date   BREAST BIOPSY Left 2004   positive   BREAST BIOPSY Right    neg   BREAST LUMPECTOMY Left 2004   positive   MASTECTOMY Left    MASTECTOMY     NEPHRECTOMY Left    TUBAL LIGATION      Family Psychiatric History:   Family History:  Family History  Problem Relation Age of Onset   Breast cancer Cousin    Breast cancer Cousin    Bipolar disorder Daughter    Drug abuse Daughter    Alcohol abuse Maternal Grandfather     Social History:  Social History   Socioeconomic History   Marital status: Married    Spouse name: Not on file   Number of children: 2   Years of education: GED   Highest education level: Not on file  Occupational History   Not on file  Tobacco Use   Smoking status: Every Day    Packs/day: 0.50    Pack years: 0.00    Types: Cigarettes   Smokeless tobacco: Never  Vaping Use   Vaping Use: Never used  Substance and Sexual Activity   Alcohol use: Yes   Drug use: Never   Sexual activity: Not Currently  Other Topics Concern   Not on file  Social History Narrative   Not on file   Social Determinants of Health   Financial Resource Strain: Not on file  Food Insecurity: Not on file  Transportation Needs: Not on file  Physical Activity: Not on file  Stress: Not on file  Social Connections: Not on file    Allergies:  Allergies  Allergen Reactions   Desvenlafaxine Anaphylaxis   Morphine And Related Anaphylaxis   Penicillins  Anaphylaxis    Has patient had a PCN reaction causing immediate rash, facial/tongue/throat swelling, SOB or lightheadedness with hypotension: Yes Has patient had a PCN reaction causing severe rash involving mucus membranes or skin necrosis: No Has patient had a PCN reaction that required hospitalization: Unknown Has patient had a PCN reaction occurring within the last 10 years: No If all of the above answers are "NO", then may proceed with Cephalosporin use.    Prazosin Other (See Comments)   Tamoxifen Anaphylaxis   Trazodone Anaphylaxis   Clonazepam    Codeine Swelling   Hydroxyzine Itching   Lorazepam     Patient feels this medications makes her to sedated and wants to avoid   Paroxetine Hcl Hives   Sulfa Antibiotics Itching   Cephalexin Rash    Metabolic Disorder Labs: Lab Results  Component Value Date   HGBA1C 5.4 10/12/2018   MPG 108.28 10/12/2018   No results found for: PROLACTIN No results found for: CHOL, TRIG, HDL, CHOLHDL, VLDL, LDLCALC Lab Results  Component Value Date   TSH 0.616 03/05/2021    Therapeutic Level Labs: No results found for: LITHIUM No results found for: VALPROATE No  components found for:  CBMZ  Current Medications: Current Outpatient Medications  Medication Sig Dispense Refill   aspirin EC 81 MG tablet Take 81 mg by mouth daily.     cetirizine-pseudoephedrine (ZYRTEC-D) 5-120 MG tablet Take 1 tablet by mouth 2 (two) times daily as needed for allergies.     Cholecalciferol 25 MCG (1000 UT) tablet Take by mouth.     denosumab (PROLIA) 60 MG/ML SOSY injection Inject 60 mg into the skin every 6 (six) months.     DULoxetine (CYMBALTA) 30 MG capsule Take 1 capsule (30 mg total) by mouth 2 (two) times daily. 180 capsule 0   DULoxetine (CYMBALTA) 30 MG capsule Take 1-2 capsules (30-60 mg total) by mouth as directed. Start taking 1 cap daily AM and 2 cap in the evening 90 capsule 1   EPINEPHrine 0.3 mg/0.3 mL IJ SOAJ injection Inject 0.3 mg into the  muscle as needed.     Fluticasone-Umeclidin-Vilant (TRELEGY ELLIPTA) 100-62.5-25 MCG/INH AEPB Inhale 1 puff into the lungs daily.     gabapentin (NEURONTIN) 100 MG capsule Take 2 capsules (200 mg total) by mouth at bedtime. 60 capsule 1   montelukast (SINGULAIR) 10 MG tablet Take 10 mg by mouth at bedtime.     VENTOLIN HFA 108 (90 Base) MCG/ACT inhaler Inhale 1-2 puffs into the lungs every 4 (four) hours as needed for cough or wheezing.     No current facility-administered medications for this visit.     Musculoskeletal: Strength & Muscle Tone: {desc; muscle tone:32375} Gait & Station: {PE GAIT ED NATL:22525} Patient leans: {Patient Leans:21022755}  Psychiatric Specialty Exam: Review of Systems  Blood pressure 136/78, pulse (!) 116, temperature (!) 97.2 F (36.2 C), temperature source Temporal, weight 134 lb 12.8 oz (61.1 kg).Body mass index is 26.33 kg/m.  General Appearance: {Appearance:22683}  Eye Contact:  {BHH EYE CONTACT:22684}  Speech:  {Speech:22685}  Volume:  {Volume (PAA):22686}  Mood:  {BHH MOOD:22306}  Affect:  {Affect (PAA):22687}  Thought Process:  {Thought Process (PAA):22688}  Orientation:  {BHH ORIENTATION (PAA):22689}  Thought Content: {Thought Content:22690}   Suicidal Thoughts:  {ST/HT (PAA):22692}  Homicidal Thoughts:  {ST/HT (PAA):22692}  Memory:  {BHH MEMORY:22881}  Judgement:  {Judgement (PAA):22694}  Insight:  {Insight (PAA):22695}  Psychomotor Activity:  {Psychomotor (PAA):22696}  Concentration:  {Concentration:21399}  Recall:  {BHH GOOD/FAIR/POOR:22877}  Fund of Knowledge: {BHH GOOD/FAIR/POOR:22877}  Language: {BHH GOOD/FAIR/POOR:22877}  Akathisia:  {BHH YES OR NO:22294}  Handed:  {Handed:22697}  AIMS (if indicated): {Desc; done/not:10129}  Assets:  {Assets (PAA):22698}  ADL's:  {BHH AQT'M:22633}  Cognition: {chl bhh cognition:304700322}  Sleep:  {BHH GOOD/FAIR/POOR:22877}   Screenings: GAD-7    Flowsheet Row Video Visit from 04/03/2021 in  [facility] Video Visit from 03/04/2021 in [facility]  Total GAD-7 Score 6 17      PHQ2-9    Flowsheet Row Video Visit from 04/03/2021 in [facility] Video Visit from 03/04/2021 in [facility] Procedure visit from 12/19/2020 in [facility] Office Visit from 08/16/2020 in [facility] Procedure visit from 07/25/2020 in [facility]  PHQ-2 Total Score 0 6 0 0 0  PHQ-9 Total Score -- 20 -- -- --      Flowsheet Row Video Visit from 03/21/2021 in [facility] ED from 03/05/2021 in [facility] ED from 03/03/2021 in [facility]  RISK CATEGORY No Risk Error: Q3, 4, or 5 should not be populated when Q2 is No No Risk        Assessment and Plan: ***   Ursula Alert, MD 05/02/2021, 2:11 PM

## 2021-05-05 ENCOUNTER — Emergency Department

## 2021-05-05 ENCOUNTER — Other Ambulatory Visit: Payer: Self-pay

## 2021-05-05 ENCOUNTER — Emergency Department
Admission: EM | Admit: 2021-05-05 | Discharge: 2021-05-05 | Disposition: A | Discharge status: Home / Self Care | Attending: Student in an Organized Health Care Education/Training Program | Admitting: Student in an Organized Health Care Education/Training Program

## 2021-05-05 DIAGNOSIS — R0602 Shortness of breath: Secondary | ICD-10-CM | POA: Diagnosis not present

## 2021-05-05 DIAGNOSIS — T148XXA Other injury of unspecified body region, initial encounter: Secondary | ICD-10-CM

## 2021-05-05 DIAGNOSIS — R600 Localized edema: Secondary | ICD-10-CM | POA: Insufficient documentation

## 2021-05-05 DIAGNOSIS — Z853 Personal history of malignant neoplasm of breast: Secondary | ICD-10-CM | POA: Insufficient documentation

## 2021-05-05 DIAGNOSIS — Z7951 Long term (current) use of inhaled steroids: Secondary | ICD-10-CM | POA: Insufficient documentation

## 2021-05-05 DIAGNOSIS — J441 Chronic obstructive pulmonary disease with (acute) exacerbation: Secondary | ICD-10-CM | POA: Insufficient documentation

## 2021-05-05 DIAGNOSIS — Z7982 Long term (current) use of aspirin: Secondary | ICD-10-CM | POA: Diagnosis not present

## 2021-05-05 DIAGNOSIS — J45901 Unspecified asthma with (acute) exacerbation: Secondary | ICD-10-CM | POA: Diagnosis not present

## 2021-05-05 DIAGNOSIS — F17210 Nicotine dependence, cigarettes, uncomplicated: Secondary | ICD-10-CM | POA: Insufficient documentation

## 2021-05-05 LAB — TROPONIN I (HIGH SENSITIVITY)
Troponin I (High Sensitivity): 5 ng/L (ref <18)
Troponin I (High Sensitivity): 5 ng/L (ref <18)

## 2021-05-05 LAB — PROTIME-INR
INR: 0.9 (ref 0.8–1.2)
Prothrombin Time: 12.2 seconds (ref 11.4–15.2)

## 2021-05-05 LAB — CBC
HCT: 42.6 % (ref 36.0–46.0)
Hemoglobin: 14.2 g/dL (ref 12.0–15.0)
MCH: 30.8 pg (ref 26.0–34.0)
MCHC: 33.3 g/dL (ref 30.0–36.0)
MCV: 92.4 fL (ref 80.0–100.0)
Platelets: 286 10*3/uL (ref 150–400)
RBC: 4.61 MIL/uL (ref 3.87–5.11)
RDW: 13.2 % (ref 11.5–15.5)
WBC: 6.4 10*3/uL (ref 4.0–10.5)
nRBC: 0 % (ref 0.0–0.2)

## 2021-05-05 LAB — BASIC METABOLIC PANEL
Anion gap: 7 (ref 5–15)
BUN: 7 mg/dL (ref 6–20)
CO2: 27 mmol/L (ref 22–32)
Calcium: 9.2 mg/dL (ref 8.9–10.3)
Chloride: 98 mmol/L (ref 98–111)
Creatinine, Ser: 0.56 mg/dL (ref 0.44–1.00)
GFR, Estimated: >60 mL/min (ref >60)
Glucose, Bld: 149 mg/dL — ABNORMAL HIGH (ref 70–99)
Potassium: 3.7 mmol/L (ref 3.5–5.1)
Sodium: 132 mmol/L — ABNORMAL LOW (ref 135–145)

## 2021-05-05 IMAGING — CT CT ANGIO CHEST
2 of 6 series · 18 of 46 positions shown · IV contrast (omnipaque)
Comparison: None.

CLINICAL DATA: Shortness of breath, tachycardia, tachypnea

EXAM:
CT ANGIOGRAPHY CHEST WITH CONTRAST
TECHNIQUE: Multidetector CT imaging of the chest was performed using the
standard protocol during bolus administration of intravenous
contrast. Multiplanar CT image reconstructions and MIPs were
obtained to evaluate the vascular anatomy.
CONTRAST:  75mL OMNIPAQUE IOHEXOL 350 MG/ML SOLN

[Series 8: thins · axial · 0.71mm/px · z∈[-487,-230]mm · 15 of 351 slices shown]
[im 15/351  lung]
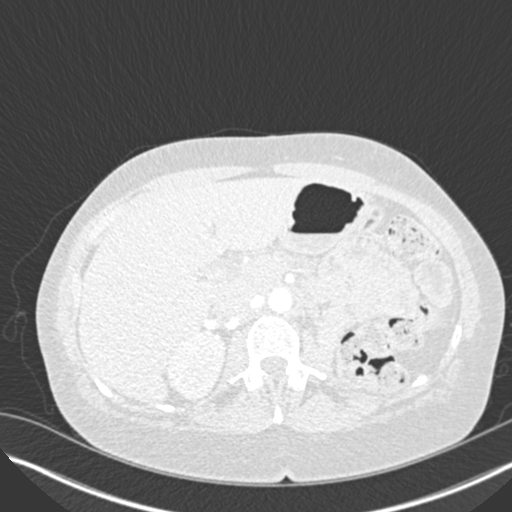
[im 44/351  soft-tissue]
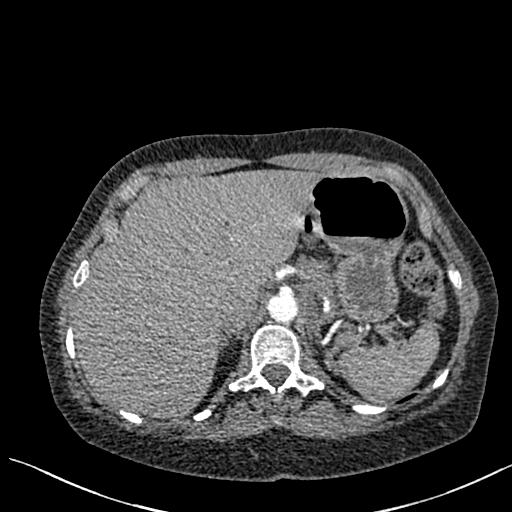
[im 59/351  lung]
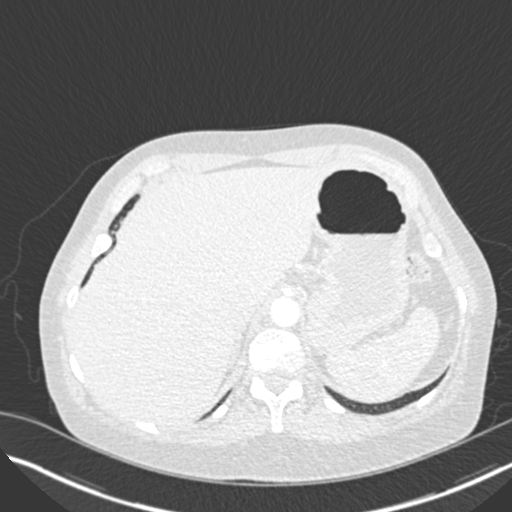
[im 88/351  soft-tissue]
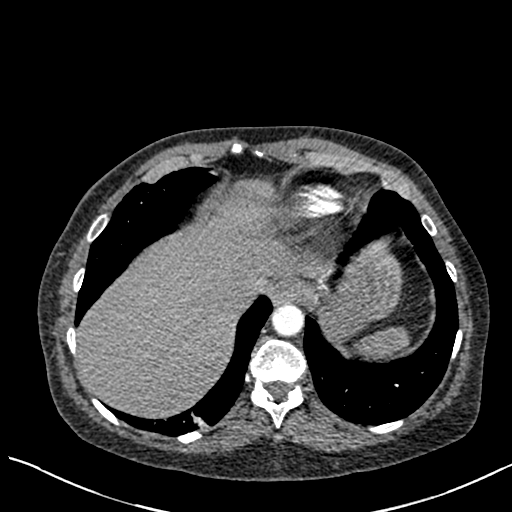
[im 103/351  lung]
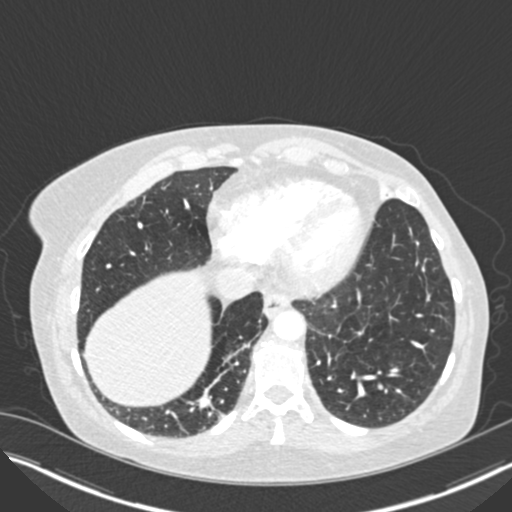
[im 132/351  soft-tissue]
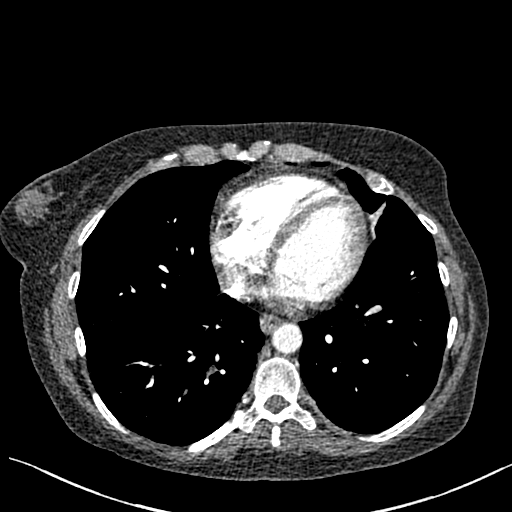
[im 146/351  lung]
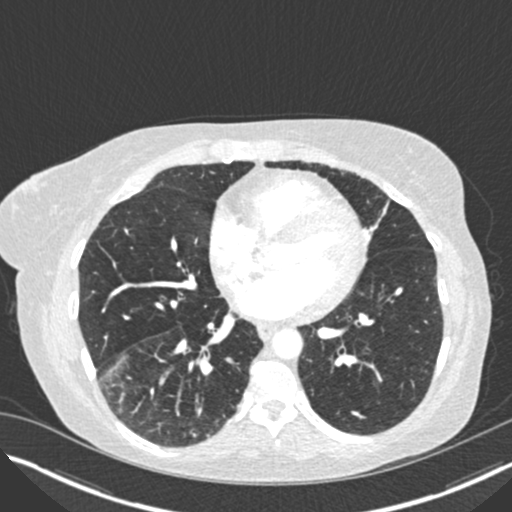
[im 176/351  soft-tissue]
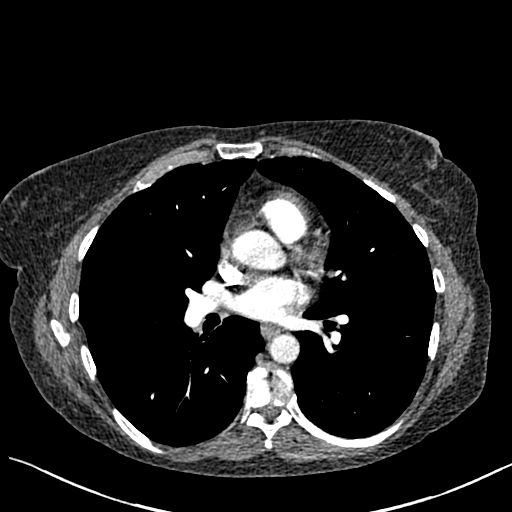
[im 205/351  lung]
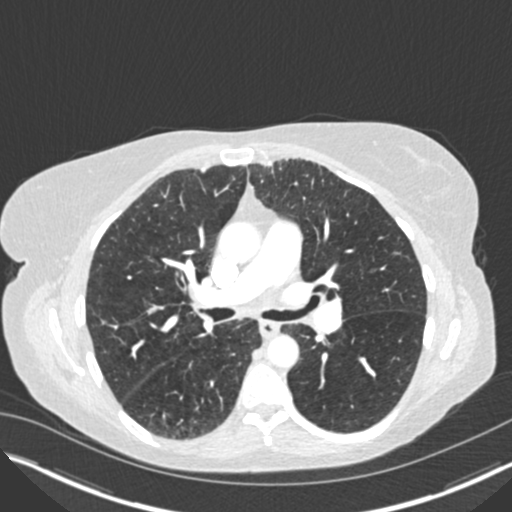
[im 219/351  soft-tissue]
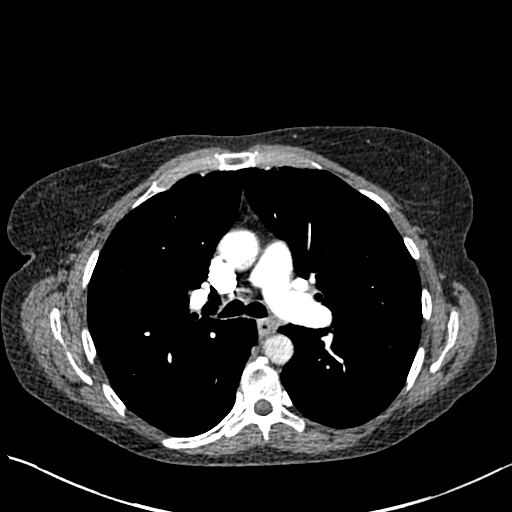
[im 248/351  lung]
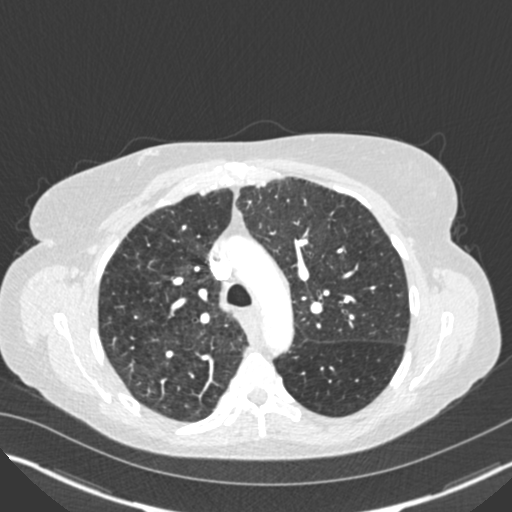
[im 263/351  soft-tissue]
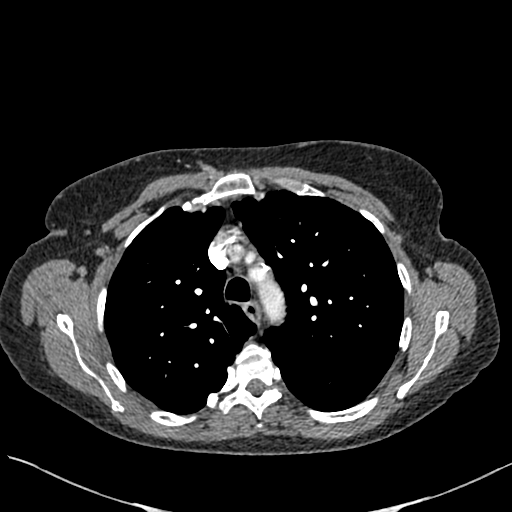
[im 292/351  lung]
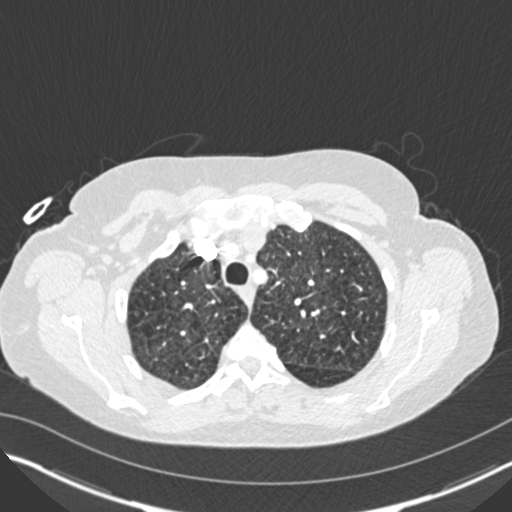
[im 307/351  soft-tissue]
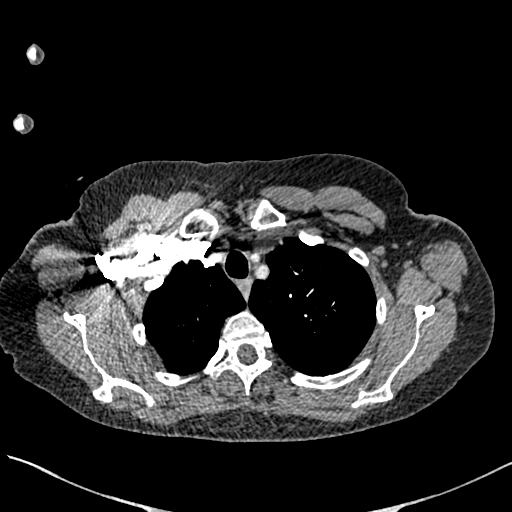
[im 336/351  lung]
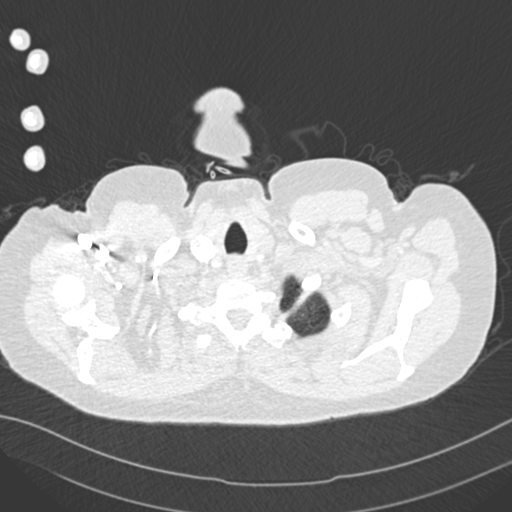

[Series 10: coronal mpr · coronal · 0.55mm/px · 3 of 92 slices shown]
[im 23/92  soft-tissue]
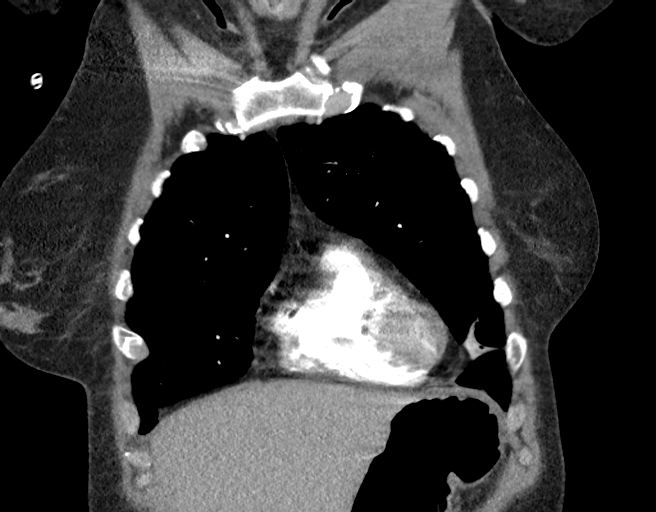
[im 46/92  soft-tissue]
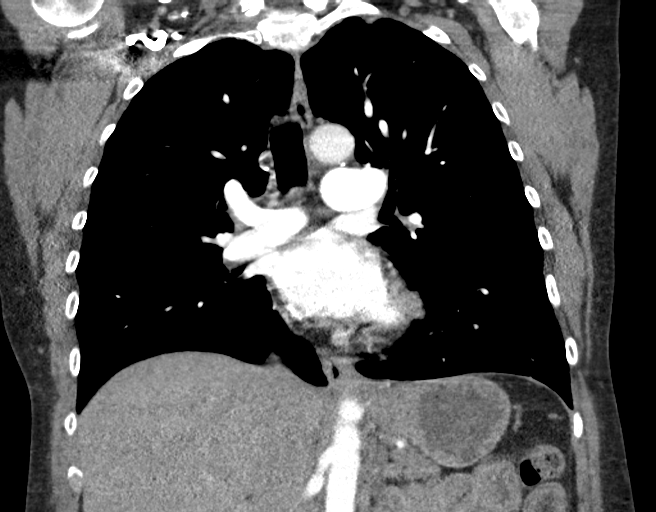
[im 69/92  soft-tissue]
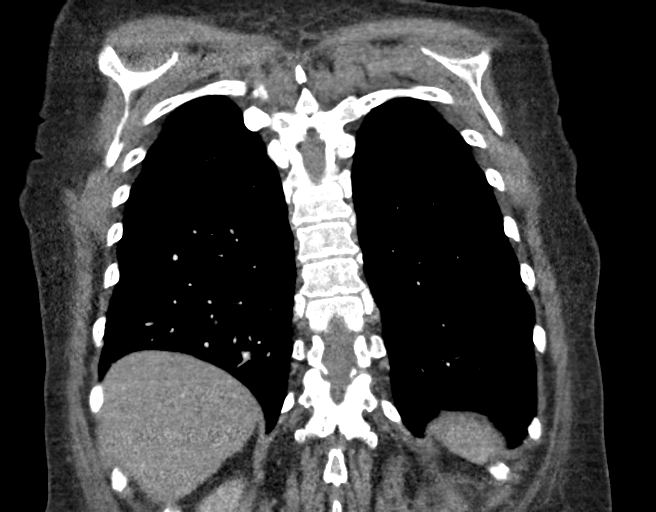

[18 of 46 positions shown; findings below may reference images not displayed]

FINDINGS: Cardiovascular: No filling defects in the pulmonary arteries to
suggest pulmonary emboli. Heart is normal size. Aorta is normal
caliber. Scattered aortic calcifications.

Mediastinum/Nodes: No mediastinal, hilar, or axillary adenopathy.
Trachea and esophagus are unremarkable. Thyroid unremarkable.

Lungs/Pleura: Mild emphysema. No confluent airspace opacities or
effusions.

Upper Abdomen: Insert for abdomen

Musculoskeletal: Chest wall soft tissues are unremarkable. No acute
bony abnormality.

Review of the MIP images confirms the above findings.
IMPRESSION: No evidence of pulmonary embolus.

No acute cardiopulmonary disease.

Aortic Atherosclerosis ([3E]-[3E]) and Emphysema ([3E]-[3E]).

## 2021-05-05 IMAGING — CR DG CHEST 2V
1 series · 2 of 2 positions shown · non-contrast
Comparison: Multiple chest x-rays since [DATE].

CLINICAL DATA: Shortness of breath.

EXAM:
CHEST - 2 VIEW

[Series 1: dg chest 2 view · 0.14mm/px · 2 of 2 slices shown]
[im 1/2]
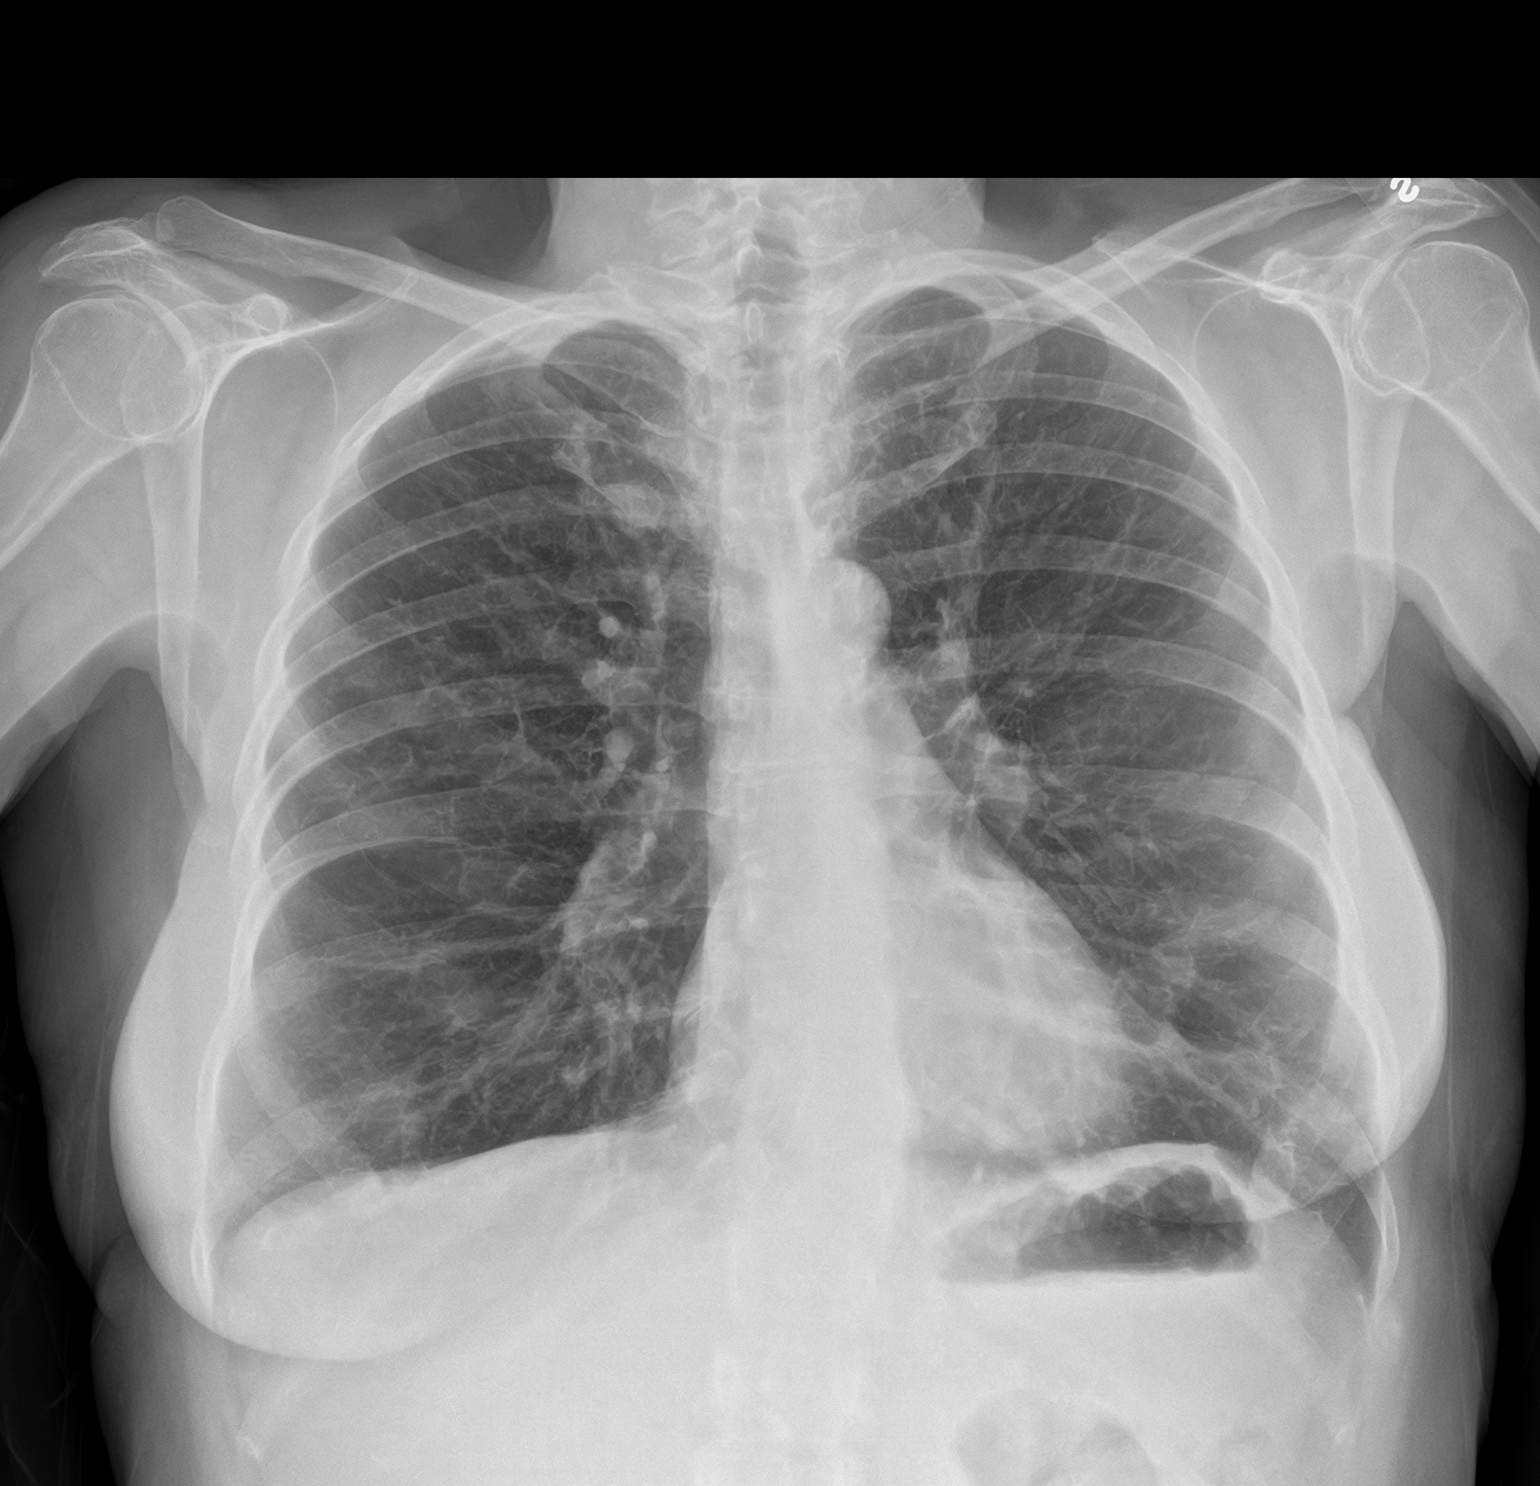
[im 2/2]
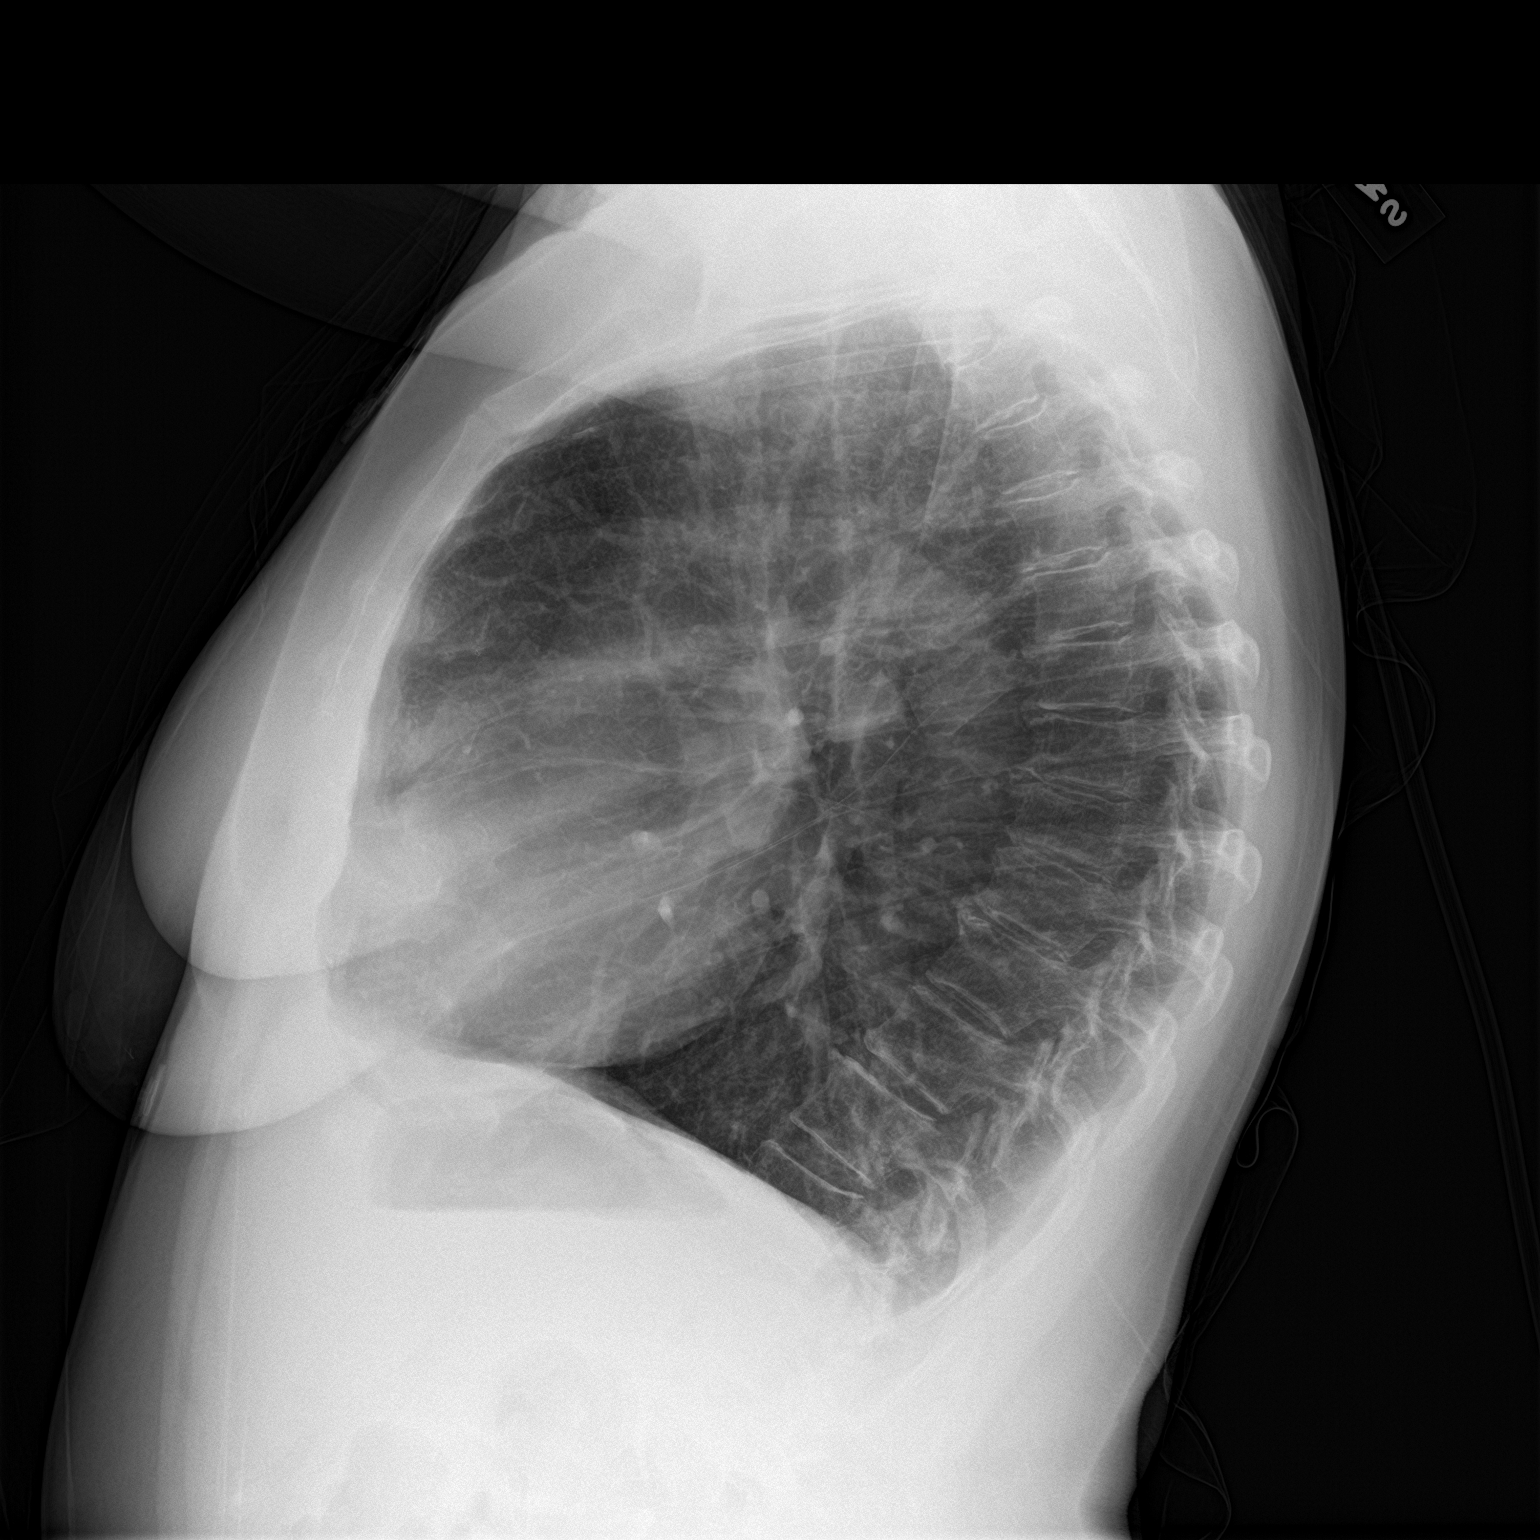

[2 of 2 positions shown; findings below may reference images not displayed]

FINDINGS: Lingular infiltrate is stable in the interval. The heart, hila,
mediastinum, lungs, and pleura are otherwise unchanged. Bronchitic
changes remain.
IMPRESSION: Stable lingular opacity.  No other abnormalities.

## 2021-05-05 IMAGING — US US EXTREM  UP VENOUS*L*
1 series · 13 of 24 positions shown · non-contrast
Comparison: None.

CLINICAL DATA: Nontraumatic left arm bruising.



[Series 1: us venous img upper uni left (dvt) · portal-venous · 13 of 31 slices shown]
[im 1/31]
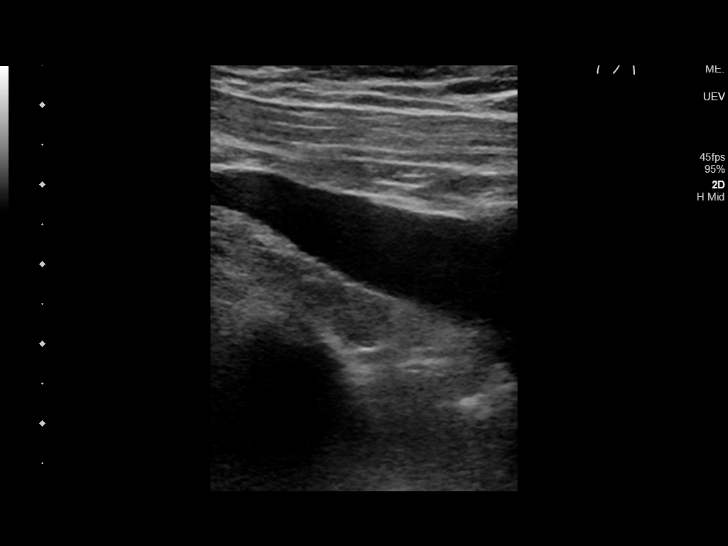
[im 3/31]
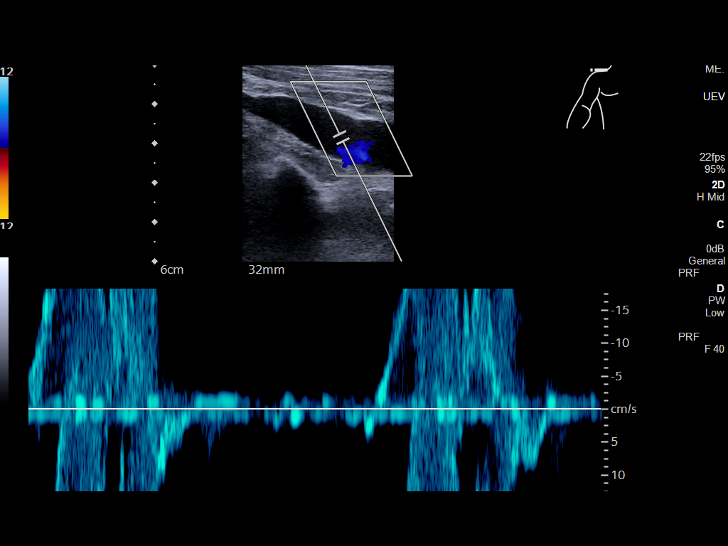
[im 6/31]
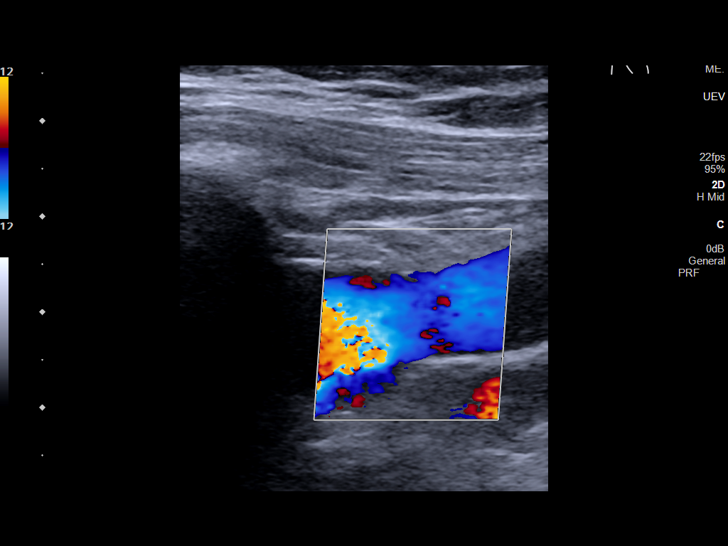
[im 8/31]
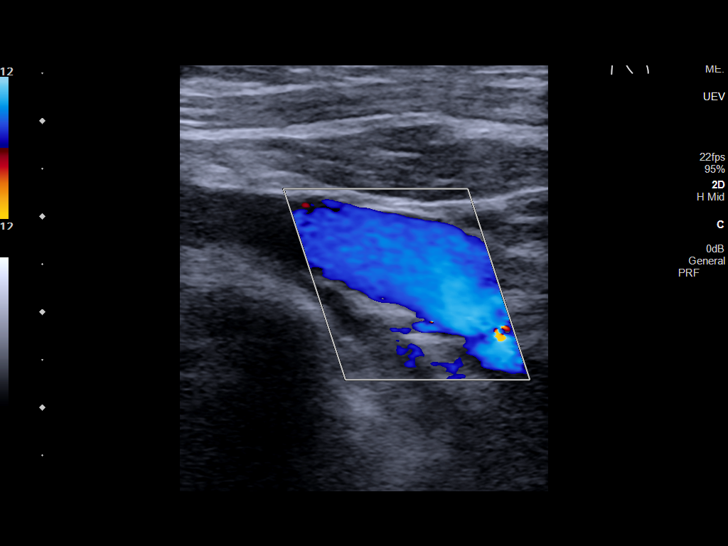
[im 11/31]
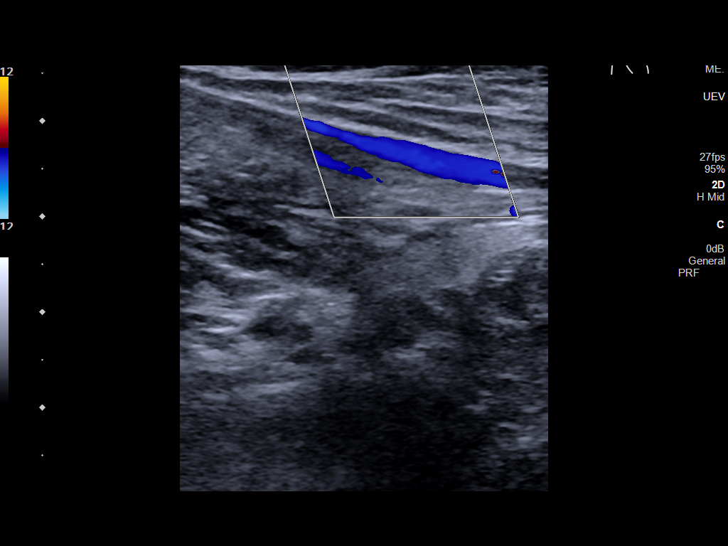
[im 14/31]
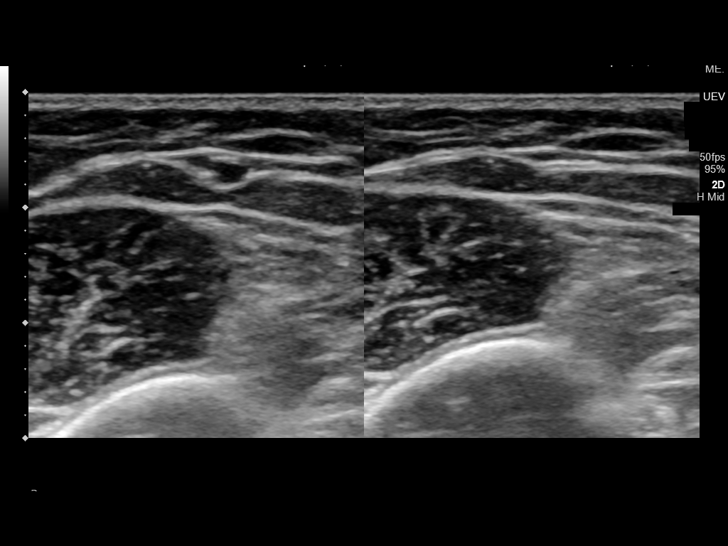
[im 16/31]
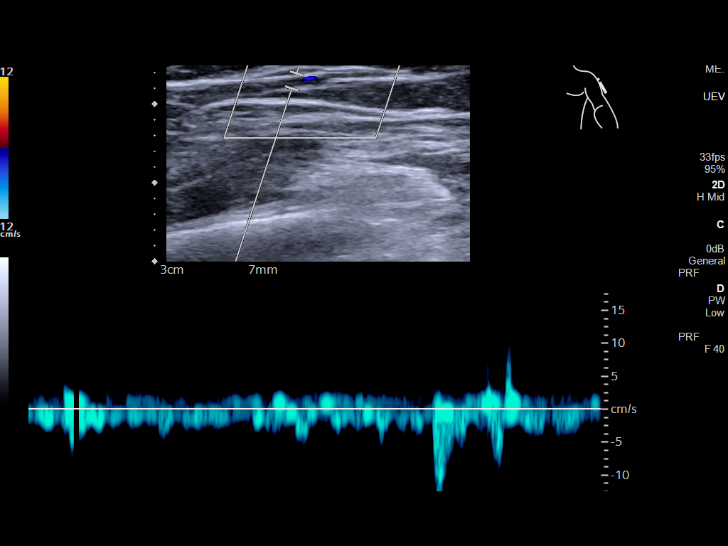
[im 17/31]
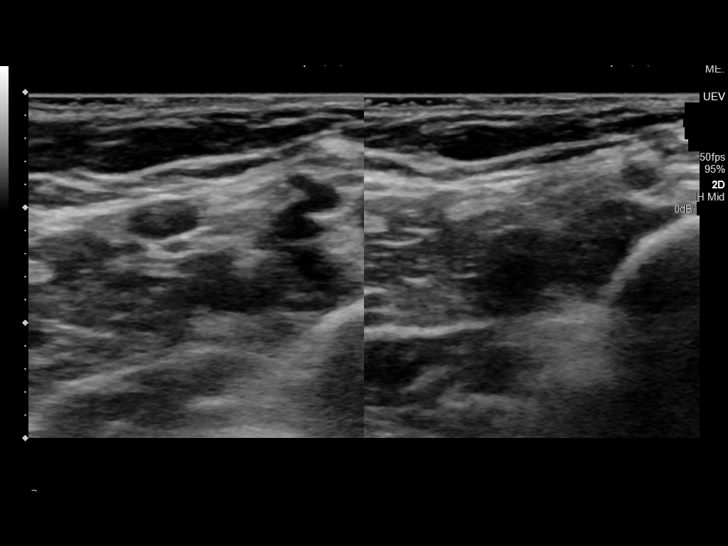
[im 20/31]
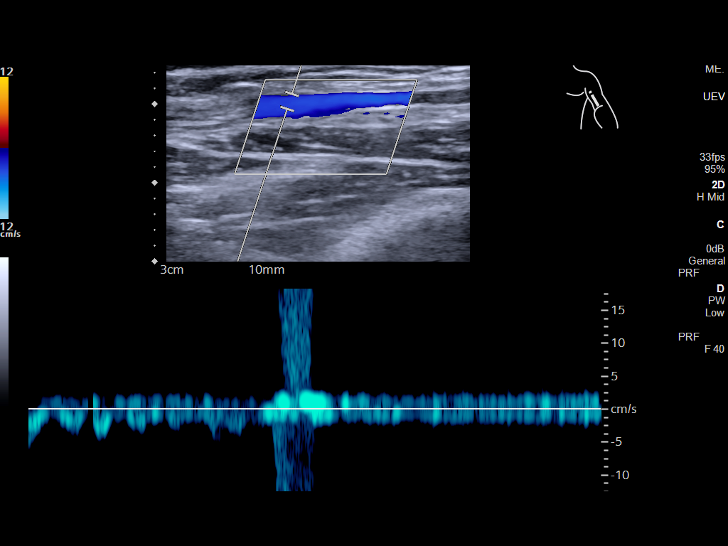
[im 23/31]
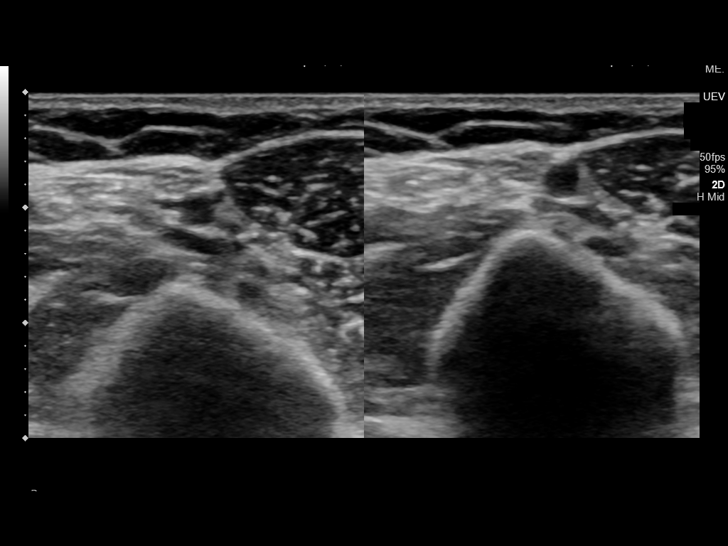
[im 25/31]
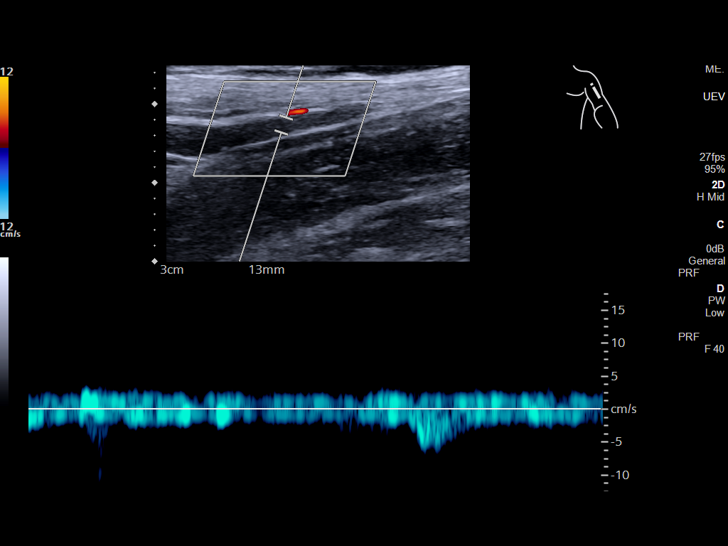
[im 28/31]
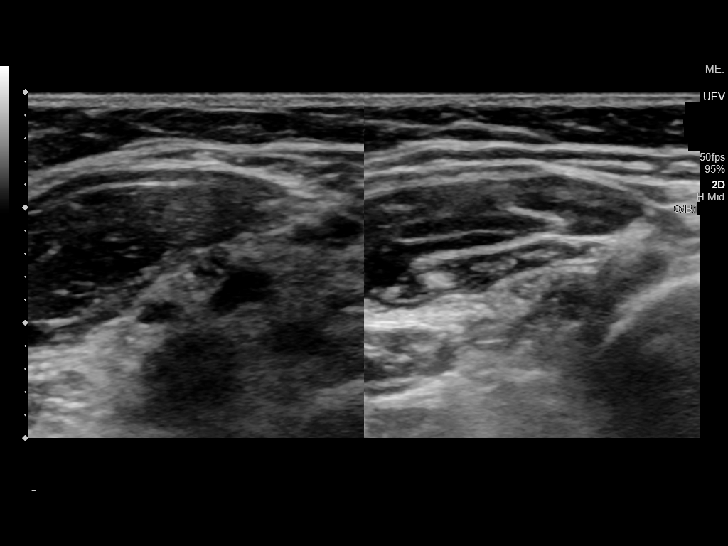
[im 31/31]
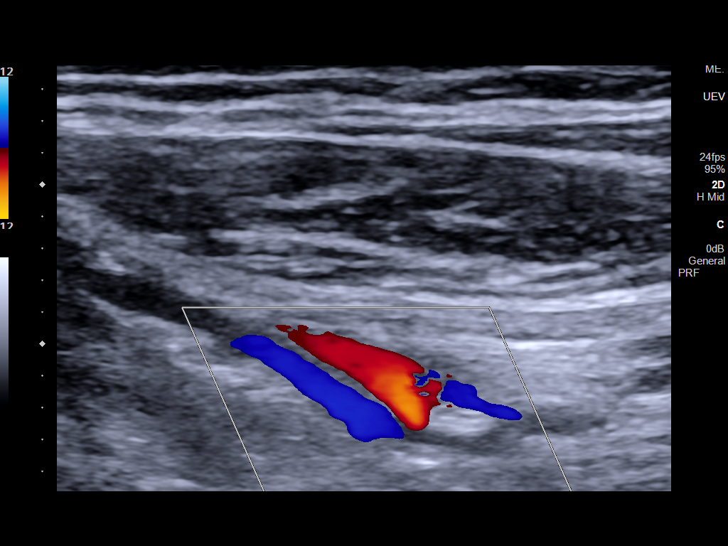

[13 of 24 positions shown; findings below may reference images not displayed]

FINDINGS: Contralateral Subclavian Vein: Respiratory phasicity is normal and
symmetric with the symptomatic side. No evidence of thrombus. Normal
compressibility.

Internal Jugular Vein: No evidence of thrombus. Normal
compressibility, respiratory phasicity and response to augmentation.

Subclavian Vein: No evidence of thrombus. Normal compressibility,
respiratory phasicity and response to augmentation.

Axillary Vein: No evidence of thrombus. Normal compressibility,
respiratory phasicity and response to augmentation.

Cephalic Vein: No evidence of thrombus. Normal compressibility,
respiratory phasicity and response to augmentation.

Basilic Vein: No evidence of thrombus. Normal compressibility,
respiratory phasicity and response to augmentation.

Brachial Veins: No evidence of thrombus. Normal compressibility,
respiratory phasicity and response to augmentation.

Radial Veins: No evidence of thrombus. Normal compressibility,
respiratory phasicity and response to augmentation.

Ulnar Veins: No evidence of thrombus. Normal compressibility,
respiratory phasicity and response to augmentation.

Venous Reflux:  None visualized.

Other Findings:  None visualized.
IMPRESSION: No evidence of DVT within the LEFT upper extremity.

## 2021-05-05 MED ORDER — IPRATROPIUM-ALBUTEROL 0.5-2.5 (3) MG/3ML IN SOLN
3.0000 mL | Freq: Once | RESPIRATORY_TRACT | Status: AC
  Administered 2021-05-05: 3 mL via RESPIRATORY_TRACT
  Filled 2021-05-05: qty 3

## 2021-05-05 MED ORDER — GABAPENTIN 600 MG PO TABS
300.0000 mg | ORAL_TABLET | Freq: Once | ORAL | Status: AC
  Administered 2021-05-05: 300 mg via ORAL
  Filled 2021-05-05: qty 1

## 2021-05-05 MED ORDER — IOHEXOL 350 MG/ML SOLN
75.0000 mL | Freq: Once | INTRAVENOUS | Status: AC | PRN
  Administered 2021-05-05: 75 mL via INTRAVENOUS

## 2021-05-05 NOTE — ED Triage Notes (Signed)
Pt also reports some swelling and discoloration to her left arm. Pt denies injuries and reports unsure of the cause. Pt had lymph nodes removed years ago due to breast cancer. Pt oxygen on RA is between 88-90. Pt states this is her baseline at home as well and she is only to use oxygen at night.

## 2021-05-05 NOTE — ED Provider Notes (Signed)
Emergency Medicine Provider Triage Evaluation Note  Colleen [name] , a 60 y.o. female  was evaluated in triage.  Pt complains of worsening SOB over last 2-3 days with associated postnasal drip and cough. Has known hx of COPD, has O2 she can wear at home at night as needed, but does not wear it typically during the day. Denies any known fevers, chest pain, abdominal pain, nausea or vomiting.  Review of Systems  Positive: Shortness of breath, cough Negative: Fever, chest pain  Physical Exam  BP 116/89 (BP Location: Right Arm)   Pulse (!) 106   Resp (!) 22   Ht 5' (1.524 m)   Wt 61.2 kg   BMI 26.37 kg/m  Gen:   Awake, no distress   Resp:  Mildly increased effort, no retractions; O2 sat without supplement noted to be 86%, placed on 2L by [place] in triage MSK:   Moves extremities without difficulty  Other:    Medical Decision Making  Medically screening exam initiated at 6:13 PM.  Appropriate orders placed.  Montia Haslip was informed that the remainder of the evaluation will be completed by another provider, this initial triage assessment does not replace that evaluation, and the importance of remaining in the ED until their evaluation is complete.    Marlana Salvage, PA 05/05/21 1815    Lucrezia Starch, MD 05/05/21 [phone]

## 2021-05-05 NOTE — ED Notes (Signed)
Pt endorses SOB - Hx of COPD and abnormal bruise on her left elbow. No other concerns at this time. Pts Spouse called and was updated on the patients status and states he is in the parking lot if needed.

## 2021-05-05 NOTE — ED Provider Notes (Signed)
[facility] Emergency Department Provider Note  ____________________________________________  Time seen: Approximately 8:27 PM  I have reviewed the triage vital signs and the nursing notes.   HISTORY  Chief Complaint Shortness of Breath    HPI Colleen [name] is a 60 y.o. female who presents to the ED for 2 complaints.  Patient's primary complaint is nontraumatic edema, erythema, and ecchymosis along the left arm.  This is from that humerus into the forearm primarily along the posterior aspect encompassing the elbow.  Patient denied hitting her elbow on any objects.  She is now on anticoagulation.  She states that if she palpates the area it is tender however there is no tenderness or pain at baseline.  Patient does not take any medications for this complaint.  She still has full range of motion to the left upper extremity.  Patient is also complaining of some ongoing nasal congestion, postnasal drip and states that she had been treated with [facility] for a sinus infection and still believes that she has symptoms.  She has ear fullness bilaterally with congestion, sinus pressure and postnasal drip.  She is also complaining of some shortness of breath at this time.  She does have a history of COPD and she states that she thinks that the postnasal drip is complicating her COPD.  However patient arrived tachypneic, tachycardic and hypoxic.  She states that she does have hypoxia at baseline and she believes that her O2 saturation typically hovers between 88 and 90%.  She wears oxygen at nighttime.  Patient does have a history of PE and was diagnosed in another state prior to moving to [place].  She states that she was not placed on anticoagulation after this PE but she had spent time in the hospital on heparin.  Patient is not taking anticoagulation at this time.  No fevers or chills.       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, L5/S1) 06/04/2020   Lumbar facet arthropathy (L4,5 S1) 06/04/2020   Cervical facet joint syndrome 06/04/2020   Osteopenia of multiple sites 06/04/2020   Age related osteoporosis 06/04/2020   Low back pain 04/27/2020   COPD exacerbation ([place]) 03/20/2020   Asthma, chronic, unspecified asthma severity, with acute exacerbation 03/20/2020   Panic attack 03/20/2020   Acute on chronic respiratory failure with hypoxia ([place])    Sepsis ([place]) 10/11/2018   CAP (community acquired pneumonia) 10/11/2018   COPD (chronic obstructive pulmonary disease) ([place]) 10/11/2018   GAD (generalized anxiety disorder) 10/11/2018    Past Surgical History:  Procedure Laterality Date   BREAST BIOPSY Left 2004   positive   BREAST BIOPSY Right    neg   BREAST LUMPECTOMY Left 2004   positive   MASTECTOMY Left    MASTECTOMY     NEPHRECTOMY Left    TUBAL LIGATION      Prior to Admission  medications   Medication Sig Start Date End Date Taking? Authorizing Provider  aspirin EC 81 MG tablet Take 81 mg by mouth daily.    [provider]  cetirizine-pseudoephedrine (ZYRTEC-D) 5-120 MG tablet Take 1 tablet by mouth 2 (two) times daily as needed for allergies.    [provider]  Cholecalciferol 25 MCG (1000 UT) tablet Take by mouth.    [provider]  denosumab (PROLIA) 60 MG/ML SOSY injection Inject 60 mg into the skin every 6 (six) months.    [provider]  DULoxetine (CYMBALTA) 30 MG capsule Take 1-2 capsules (30-60 mg total) by mouth as directed. Start taking 1 cap daily AM and 2 cap in the evening 04/03/21   Eappen, Ria Clock, MD  EPINEPHrine 0.3 mg/0.3 mL IJ SOAJ injection Inject 0.3 mg into the muscle as needed.    [provider]  Fluticasone-Umeclidin-Vilant (TRELEGY ELLIPTA) 100-62.5-25 MCG/INH AEPB Inhale 1 puff into the lungs daily.    [provider]  gabapentin (NEURONTIN) 300 MG capsule Take 1 capsule (300 mg total) by mouth 2 (two) times daily. 05/02/21   Ursula Alert, MD  montelukast (SINGULAIR) 10 MG tablet Take 10 mg by mouth at bedtime.    [provider]  VENTOLIN HFA 108 (90 Base) MCG/ACT inhaler Inhale 1-2 puffs into the lungs every 4 (four) hours as needed for cough or wheezing. 10/02/18   [provider]    Allergies Desvenlafaxine, Morphine and related, Penicillins, Prazosin, Tamoxifen, Trazodone, Clonazepam, Codeine, Hydroxyzine, Lorazepam, Paroxetine hcl, Sulfa antibiotics, and Cephalexin  Family History  Problem Relation Age of Onset   Breast cancer Cousin    Breast cancer Cousin    Bipolar disorder Daughter    Drug abuse Daughter    Alcohol abuse Maternal Grandfather     Social History Social History   Tobacco Use   Smoking status: Every Day    Packs/day: 0.50    Pack years: 0.00    Types: Cigarettes   Smokeless tobacco: Never  Vaping Use   Vaping Use: Never used  Substance Use Topics   Alcohol use: Yes   Drug use: Never     Review of Systems  Constitutional: No fever/chills Eyes: No visual changes. No discharge ENT: Sinus congestion and postnasal drip Cardiovascular: no chest pain. Respiratory: no cough.  Shortness of breath. Gastrointestinal: No abdominal pain.  No nausea, no vomiting.  No diarrhea.  No constipation. Musculoskeletal: Nontraumatic left elbow edema, ecchymosis and erythema. Skin: Negative for rash, abrasions, lacerations,  ecchymosis. Neurological: Negative for headaches, focal weakness or numbness.  10 System ROS otherwise negative.  ____________________________________________   PHYSICAL EXAM:  VITAL SIGNS: ED Triage Vitals  Enc Vitals Group     BP 05/05/21 1801 116/89     Pulse Rate 05/05/21 1801 (!) 106     Resp 05/05/21 1801 (!) 22     Temp --      Temp src --      SpO2 --      Weight 05/05/21 1758 135 lb (61.2 kg)     Height 05/05/21 1758 5' (1.524 m)     Head Circumference --      Peak Flow --      Pain Score 05/05/21 1758 0     Pain Loc --      Pain Edu? --      Excl. in [place]? --      Constitutional: Alert and oriented. Well appearing and in no acute distress. Eyes: Conjunctivae are  normal. PERRL. EOMI. Head: Atraumatic. ENT:      Ears: Mild bulging of the TMs bilaterally worse on the right than left.  No injection.      Nose: No congestion/rhinnorhea.  Tenderness to percussion over the maxillary sinuses.  No tenderness over the frontal or ethmoid sinuses.      Mouth/Throat: Mucous membranes are moist.  Neck: No stridor.   Hematological/Lymphatic/Immunilogical: No cervical lymphadenopathy. Cardiovascular: Normal rate, regular rhythm. Normal S1 and S2.  Good peripheral circulation. Respiratory: Normal respiratory effort without tachypnea or retractions. Lungs CTAB. Good air entry to the bases with no decreased or absent breath sounds. Gastrointestinal: Bowel sounds 4 quadrants. Soft and nontender to palpation. No guarding or rigidity. No palpable masses. No distention. No CVA tenderness. Musculoskeletal: Full range of motion to all extremities. No gross deformities appreciated. Neurologic:  Normal speech and language. No gross focal neurologic deficits are appreciated.  Skin:  Skin is warm, dry and intact. No rash noted. Psychiatric: Mood and affect are normal. Speech and behavior are normal. Patient exhibits appropriate insight and  judgement.   ____________________________________________   LABS (all labs ordered are listed, but only abnormal results are displayed)  Labs Reviewed  BASIC METABOLIC PANEL - Abnormal; Notable for the following components:      Result Value   Sodium 132 (*)    Glucose, Bld 149 (*)    All other components within normal limits  CBC  PROTIME-INR  TROPONIN I (HIGH SENSITIVITY)  TROPONIN I (HIGH SENSITIVITY)   ____________________________________________  EKG  ED ECG REPORT I, Charline Bills Lesly Pontarelli,  personally viewed and interpreted this ECG.   Date: 05/05/2021  EKG Time: 1800 hrs.  Rate: 104 bpm  Rhythm: unchanged from previous tracings, sinus tachycardia  Axis: Normal axis  Intervals:none  ST&T Change: No ST elevation or depression or  Sinus tachycardia, no STEMI.  ____________________________________________  RADIOLOGY I personally viewed and evaluated these images as part of my medical decision making, as well as reviewing the written report by the radiologist.  ED Provider Interpretation: No acute cardiopulmonary abnormality.  Stable opacity largely unchanged  DG Chest 2 View  Result Date: 05/05/2021 CLINICAL DATA:  Shortness of breath. EXAM: CHEST - 2 VIEW COMPARISON:  Multiple chest x-rays since Mar 20, 2020. FINDINGS: Lingular infiltrate is stable in the interval. The heart, hila, mediastinum, lungs, and pleura are otherwise unchanged. Bronchitic changes remain. IMPRESSION: Stable lingular opacity.  No other abnormalities. Electronically Signed   By: Dorise Bullion III M.D   On: 05/05/2021 18:50    ____________________________________________    PROCEDURES  Procedure(s) performed:    Procedures    Medications  iohexol (OMNIPAQUE) 350 MG/ML injection 75 mL (has no administration in time range)     ____________________________________________   INITIAL IMPRESSION / ASSESSMENT AND PLAN / ED COURSE  Pertinent labs & imaging results that were  available during my care of the patient were reviewed by me and considered in my medical decision making (see chart for details).  Review of the [place] CSRS was performed in accordance of the [place] prior to dispensing any controlled drugs.           Patient arrived with complaint of left arm swelling, ecchymosis and erythema without trauma.  She is also complaining some increased shortness of breath and unresolved sinus infection symptoms.  Did have ecchymosis, edema and erythema about the left elbow mostly along the posterior aspect.  No appearance of septic joint.  It appears that the patient does have  a large area of ecchymosis and I suspect there is some component of injury that precipitated the bruising however I will ultrasound the area to ensure no DVT.  Patient also has a history of COPD, was recently treated for sinus infection without significant improvement.  She arrived tachycardic, tachypneic, hypoxic and on discussion patient stated that she had a history of PE but is not on anticoagulation.  Feel that patient would benefit from CTA of the chest to ensure no evidence of PE.  At this time patient's work-up is still pending.  Patient care was transferred to attending provider, Dr. Quentin Cornwall for final diagnosis and disposition.    This chart was dictated using voice recognition software/Dragon. Despite best efforts to proofread, errors can occur which can change the meaning. Any change was purely unintentional.    Darletta Moll, PA-C 05/05/21 2057    Merlyn Lot, MD 05/05/21 [phone]

## 2021-05-25 ENCOUNTER — Other Ambulatory Visit: Payer: Self-pay

## 2021-05-25 ENCOUNTER — Telehealth (INDEPENDENT_AMBULATORY_CARE_PROVIDER_SITE_OTHER): Admitting: Psychiatry

## 2021-05-25 ENCOUNTER — Encounter (HOSPITAL_COMMUNITY): Payer: Self-pay | Admitting: Psychiatry

## 2021-05-25 DIAGNOSIS — F4310 Post-traumatic stress disorder, unspecified: Secondary | ICD-10-CM

## 2021-05-25 DIAGNOSIS — F411 Generalized anxiety disorder: Secondary | ICD-10-CM | POA: Diagnosis not present

## 2021-05-25 DIAGNOSIS — F331 Major depressive disorder, recurrent, moderate: Secondary | ICD-10-CM

## 2021-05-25 NOTE — Progress Notes (Signed)
[facility] MD OP Progress Note  05/02/2021 7:03 PM Colleen [name]  MRN:  [id number]  Chief Complaint:   Early follow up depression.  HPI: Colleen [name] is a 60 year old Caucasian female, married, lives in [place] has a history of PTSD, MDD, insomnia, chronic back pain, COPD, radiation therapy per history, past surgical history of nephrectomy, mastectomy, tubal ligation was evaluated in office today.  Patient needed an early follow-up she is a patient of Dr. Lalla Brothers having mood symptoms specially depression,  Anxiety is there , poor communication with husband and medical co moribdity of pain effects mood  She believes increasing gabapentin may cause nightmares and she wants to consider increasing Cymbalta for her depression she is planning to join AA although she is not drinking but she has family history     Patient currently denies any suicidality.  She reports sleep is improving however she does have pain which does have an impact on her sleep.  Patient is compliant on medications, denies side effects.  She is currently in psychotherapy sessions with a therapist and reports therapy sessions are beneficial.  Patient is not interested in family therapy since she feels she tried it in the past and it did not help much.  States that sleep can be irregular her focus is she does not want her depression to get worse for which she has made this early appointment as her main provider was not available  Visit Diagnosis:    ICD-10-CM   1. MDD (major depressive disorder), recurrent episode, moderate (HCC)  F33.1     2. GAD (generalized anxiety disorder)  F41.1     3. PTSD (post-traumatic stress disorder)  F43.10       Past Psychiatric History: I have reviewed past psychiatric history from progress note on 03/04/2021.  Past trials of Paxil, Klonopin, trazodone, prazosin, desvenlafaxine, lorazepam  Past Medical History:  Past Medical History:  Diagnosis Date   Anxiety    Asthma    Breast  cancer ([place]) 2004   left breast   Chronic back pain    COPD (chronic obstructive pulmonary disease) ([place])    Depression    Personal history of radiation therapy     Past Surgical History:  Procedure Laterality Date   BREAST BIOPSY Left 2004   positive   BREAST BIOPSY Right    neg   BREAST LUMPECTOMY Left 2004   positive   MASTECTOMY Left    MASTECTOMY     NEPHRECTOMY Left    TUBAL LIGATION      Family Psychiatric History: Reviewed family psychiatric history from progress note on 03/04/2021  Family History:  Family History  Problem Relation Age of Onset   Breast cancer Cousin    Breast cancer Cousin    Bipolar disorder Daughter    Drug abuse Daughter    Alcohol abuse Maternal Grandfather     Social History: Reviewed social history from progress note on 03/04/2021 Social History   Socioeconomic History   Marital status: Married    Spouse name: Not on file   Number of children: 2   Years of education: GED   Highest education level: Not on file  Occupational History   Not on file  Tobacco Use   Smoking status: Every Day    Packs/day: 0.50    Types: Cigarettes   Smokeless tobacco: Never  Vaping Use   Vaping Use: Never used  Substance and Sexual Activity   Alcohol use: Yes   Drug use: Never   Sexual  activity: Not Currently  Other Topics Concern   Not on file  Social History Narrative   Not on file   Social Determinants of Health   Financial Resource Strain: Not on file  Food Insecurity: Not on file  Transportation Needs: Not on file  Physical Activity: Not on file  Stress: Not on file  Social Connections: Not on file    Allergies:  Allergies  Allergen Reactions   Desvenlafaxine Anaphylaxis   Morphine And Related Anaphylaxis   Penicillins Anaphylaxis    Has patient had a PCN reaction causing immediate rash, facial/tongue/throat swelling, SOB or lightheadedness with hypotension: Yes Has patient had a PCN reaction causing severe rash involving mucus  membranes or skin necrosis: No Has patient had a PCN reaction that required hospitalization: Unknown Has patient had a PCN reaction occurring within the last 10 years: No If all of the above answers are "NO", then may proceed with Cephalosporin use.    Prazosin Other (See Comments)   Tamoxifen Anaphylaxis   Trazodone Anaphylaxis   Clonazepam    Codeine Swelling   Hydroxyzine Itching   Lorazepam     Patient feels this medications makes her to sedated and wants to avoid   Paroxetine Hcl Hives   Sulfa Antibiotics Itching   Cephalexin Rash    Metabolic Disorder Labs: Lab Results  Component Value Date   HGBA1C 5.4 10/12/2018   MPG 108.28 10/12/2018   No results found for: PROLACTIN No results found for: CHOL, TRIG, HDL, CHOLHDL, VLDL, LDLCALC Lab Results  Component Value Date   TSH 0.616 03/05/2021    Therapeutic Level Labs: No results found for: LITHIUM No results found for: VALPROATE No components found for:  CBMZ  Current Medications: Current Outpatient Medications  Medication Sig Dispense Refill   aspirin EC 81 MG tablet Take 81 mg by mouth daily.     cetirizine-pseudoephedrine (ZYRTEC-D) 5-120 MG tablet Take 1 tablet by mouth 2 (two) times daily as needed for allergies.     Cholecalciferol 25 MCG (1000 UT) tablet Take by mouth.     denosumab (PROLIA) 60 MG/ML SOSY injection Inject 60 mg into the skin every 6 (six) months.     DULoxetine (CYMBALTA) 30 MG capsule Take 1-2 capsules (30-60 mg total) by mouth as directed. Start taking 1 cap daily AM and 2 cap in the evening 90 capsule 1   EPINEPHrine 0.3 mg/0.3 mL IJ SOAJ injection Inject 0.3 mg into the muscle as needed.     Fluticasone-Umeclidin-Vilant (TRELEGY ELLIPTA) 100-62.5-25 MCG/INH AEPB Inhale 1 puff into the lungs daily.     gabapentin (NEURONTIN) 300 MG capsule Take 1 capsule (300 mg total) by mouth 2 (two) times daily. 60 capsule 1   montelukast (SINGULAIR) 10 MG tablet Take 10 mg by mouth at bedtime.      VENTOLIN HFA 108 (90 Base) MCG/ACT inhaler Inhale 1-2 puffs into the lungs every 4 (four) hours as needed for cough or wheezing.     No current facility-administered medications for this visit.     Musculoskeletal: Strength & Muscle Tone:  UTA Gait & Station: normal Patient leans: N/A  Psychiatric Specialty Exam: Review of Systems  Musculoskeletal:  Positive for back pain.  Psychiatric/Behavioral:  Positive for dysphoric mood. The patient is nervous/anxious.   All other systems reviewed and are negative.  There were no vitals taken for this visit.There is no height or weight on file to calculate BMI.  General Appearance: Casual  Eye Contact:  Fair  Speech:  Clear and Coherent  Volume:  Normal  Mood: Dysphoric  Affect:  Congruent  Thought Process:  Goal Directed and Descriptions of Associations: Intact  Orientation:  Full (Time, Place, and Person)  Thought Content: Logical   Suicidal Thoughts:  No  Homicidal Thoughts:  No  Memory:  Immediate;   Fair Recent;   Fair Remote;   Fair  Judgement:  Fair  Insight:  Fair  Psychomotor Activity:  Normal  Concentration:  Concentration: Good and Attention Span: Good  Recall:  Good  Fund of Knowledge: Good  Language: Good  Akathisia:  No  Handed:  Right  AIMS (if indicated): done  Assets:  Communication Skills Desire for Improvement Housing Talents/Skills Transportation  ADL's:  Intact  Cognition: WNL  Sleep:   Improving   Screenings: GAD-7    Flowsheet Row Video Visit from 04/03/2021 in [place] Video Visit from 03/04/2021 in [place]  Total GAD-7 Score 6 17      PHQ2-9    Flowsheet Row Video Visit from 04/03/2021 in [facility] Video Visit from 03/04/2021 in [place] Procedure visit from 12/19/2020 in [place] Office Visit from 08/16/2020 in [place] Procedure visit from 07/25/2020 in [place]  PHQ-2 Total Score 0 6 0 0 0  PHQ-9 Total Score -- 20 -- -- --      Flowsheet Row Video Visit from 05/25/2021 in [facility] ED from 05/05/2021 in [place] Video Visit from 03/21/2021 in [place] No Risk Error: Question 6 not populated No Risk        Assessment and Plan:  Prior documentation reviewed Colleen [name] is a 60 year old Caucasian female, unemployed, married, has a history of anxiety, PTSD, chronic pain, multiple medical problems was evaluated in office today.  Patient with relationship struggles as well as medical problems including chronic pain continues to struggle with mood.  Discussed plan as noted below.  Plan MDD-feeling dysphoric Cymbalta increased to 120 mg divided dose she has medication as of now she can use the current capsules continue CBT with Ms. Shary Decamp Call earlier if needed or if symptoms worse  GAD-fluctuates continue gabapentin 2 times a day increase Cymbalta to 120 mg/day     PTSD-Baseline Cymbalta as prescribed Continue CBT  Insomnia likely due to medical condition like pain-fluctuates with gabapentin does help continue CBT  Pending [employer] testing report.  Provided reassurance, provided resources.  Patient to start [place] meetings.  Patient also to join a church.   Patient is scheduled with Dr.Eapen end of this month, can call earlier if needed She agrees with plan  Non face to face time spent and documentation 19 min   Merian Capron, MD 05/25/2021, 8:39 AM

## 2021-05-30 ENCOUNTER — Telehealth: Payer: Self-pay | Admitting: Student in an Organized Health Care Education/Training Program

## 2021-05-30 NOTE — Telephone Encounter (Signed)
You discussed LESI at last appt., and instructed her to call in July. Do you wish to order LESI?

## 2021-06-05 ENCOUNTER — Telehealth (INDEPENDENT_AMBULATORY_CARE_PROVIDER_SITE_OTHER): Admitting: Psychiatry

## 2021-06-05 ENCOUNTER — Other Ambulatory Visit: Payer: Self-pay

## 2021-06-05 ENCOUNTER — Encounter: Payer: Self-pay | Admitting: Psychiatry

## 2021-06-05 DIAGNOSIS — F4310 Post-traumatic stress disorder, unspecified: Secondary | ICD-10-CM

## 2021-06-05 DIAGNOSIS — G4701 Insomnia due to medical condition: Secondary | ICD-10-CM | POA: Diagnosis not present

## 2021-06-05 DIAGNOSIS — F411 Generalized anxiety disorder: Secondary | ICD-10-CM | POA: Diagnosis not present

## 2021-06-05 DIAGNOSIS — F331 Major depressive disorder, recurrent, moderate: Secondary | ICD-10-CM

## 2021-06-05 MED ORDER — QUETIAPINE FUMARATE 25 MG PO TABS: 12.5000 mg | ORAL_TABLET | Freq: Every day | ORAL | 0 refills | Status: DC

## 2021-06-05 NOTE — Patient Instructions (Signed)
Quetiapine tablets What is this medication? QUETIAPINE (kwe TYE a peen) is an antipsychotic. It is used to treatschizophrenia and bipolar disorder, also known as manic-depression. This medicine may be used for other purposes; ask your health care provider orpharmacist if you have questions. COMMON BRAND NAME(S): Seroquel What should I tell my care team before I take this medication? They need to know if you have any of these conditions: blockage in your bowel cataracts constipation dementia diabetes difficulty swallowing glaucoma heart disease high levels of prolactin history of breast cancer history of irregular heartbeat liver disease low blood counts, like low white cell, platelet, or red cell counts low blood pressure Parkinson's disease prostate disease seizures suicidal thoughts, plans or attempt; a previous suicide attempt by you or a family member thyroid disease trouble passing urine an unusual or allergic reaction to quetiapine, other medicines, foods, dyes, or preservatives pregnant or trying to get pregnant breast-feeding How should I use this medication? Take this medicine by mouth. Swallow it with a drink of water. Follow the directions on the prescription label. If it upsets your stomach you can take it with food. Take your medicine at regular intervals. Do not take it more often than directed. Do not stop taking except on the advice of your doctor or healthcare professional. A special MedGuide will be given to you by the pharmacist with eachprescription and refill. Be sure to read this information carefully each time. Talk to your pediatrician regarding the use of this medicine in children. While this drug may be prescribed for children as young as 10 years for selectedconditions, precautions do apply. Patients over age 14 years may have a stronger reaction to this medicine andneed smaller doses. Overdosage: If you think you have taken too much of this medicine  contact apoison control center or emergency room at once. NOTE: This medicine is only for you. Do not share this medicine with others. What if I miss a dose? If you miss a dose, take it as soon as you can. If it is almost time for yournext dose, take only that dose. Do not take double or extra doses. What may interact with this medication? Do not take this medicine with any of the following medications: cisapride dronedarone metoclopramide pimozide thioridazine This medicine may also interact with the following medications: alcohol antihistamines for allergy, cough, and cold atropine avasimibe certain antivirals for HIV or hepatitis certain medicines for anxiety or sleep certain medicines for bladder problems like oxybutynin, tolterodine certain medicines for depression like amitriptyline, fluoxetine, nefazodone, sertraline certain medicines for fungal infections like fluconazole, ketoconazole, itraconazole, posaconazole certain medicines for stomach problems like dicyclomine, hyoscyamine certain medicines for travel sickness like scopolamine cimetidine general anesthetics like halothane, isoflurane, methoxyflurane, propofol ipratropium levodopa or other medicines for Parkinson's disease medicines for blood pressure medicines for seizures medicines that relax muscles for surgery narcotic medicines for pain other medicines that prolong the QT interval (cause an abnormal heart rhythm) phenothiazines like chlorpromazine, prochlorperazine rifampin St. John's wort This list may not describe all possible interactions. Give your health care provider a list of all the medicines, herbs, non-prescription drugs, or dietary supplements you use. Also tell them if you smoke, drink alcohol, or use illegaldrugs. Some items may interact with your medicine. What should I watch for while using this medication? Visit your health care professional for regular checks on your progress. Tell your health  care professional if symptoms do not start to get better or if they get worse. Do not stop taking  except on your health care professional's advice. You may develop a severe reaction. Your health care professional will tell youhow much medicine to take. You may need to have an eye exam before and during use of this medicine. This medicine may increase blood sugar. Ask your health care provider ifchanges in diet or medicines are needed if you have diabetes. Patients and their families should watch out for new or worsening depression or thoughts of suicide. Also watch out for sudden or severe changes in feelings such as feeling anxious, agitated, panicky, irritable, hostile, aggressive, impulsive, severely restless, overly excited and hyperactive, or not being able to sleep. If this happens, especially at the beginning of antidepressanttreatment or after a change in dose, call your health care professional. Dennis Bast may get dizzy or drowsy. Do not drive, use machinery, or do anything that needs mental alertness until you know how this medicine affects you. Do not stand or sit up quickly, especially if you are an older patient. This reduces the risk of dizzy or fainting spells. Alcohol may interfere with the effect ofthis medicine. Avoid alcoholic drinks. This drug can cause problems with controlling your body temperature. It can lower the response of your body to cold temperatures. If possible, stay indoors during cold weather. If you must go outdoors, wear warm clothes. It can also lower the response of your body to heat. Do not overheat. Do not over-exercise. Stay out of the sun when possible. If you must be in the sun, wear cool clothing. Drink plenty of water. If you have trouble controlling your bodytemperature, call your health care provider right away. What side effects may I notice from receiving this medication? Side effects that you should report to your doctor or health care professionalas soon as  possible: allergic reactions like skin rash, itching or hives, swelling of the face, lips, or tongue breathing problems changes in vision confusion elevated mood, decreased need for sleep, racing thoughts, impulsive behavior eye pain fast, irregular heartbeat fever or chills, sore throat inability to keep still males: prolonged or painful erection problems with balance, talking, walking redness, blistering, peeling, or loosening of the skin, including inside the mouth seizures signs and symptoms of high blood sugar such as being more thirsty or hungry or having to urinate more than normal. You may also feel very tired or have blurry vision signs and symptoms of hypothyroidism like fatigue; increased sensitivity to cold; weight gain; hoarseness; thinning hair signs and symptoms of low blood pressure like dizziness; feeling faint or lightheaded; falls; unusually weak or tired signs and symptoms of neuroleptic malignant syndrome like confusion; fast, irregular heartbeat; high fever; increased sweating; stiff muscles sudden numbness or weakness of the face, arm, or leg suicidal thoughts or other mood changes trouble swallowing uncontrollable movements of the arms, face, head, mouth, neck, or upper body Side effects that usually do not require medical attention (report to yourdoctor or health care professional if they continue or are bothersome): change in sex drive or performance constipation drowsiness dry mouth upset stomach weight gain This list may not describe all possible side effects. Call your doctor for medical advice about side effects. You may report side effects to FDA at1-800-FDA-1088. Where should I keep my medication? Keep out of the reach of children. Store at room temperature between 15 and 30 degrees C (59 and 86 degrees F).Throw away any unused medicine after the expiration date. NOTE: This sheet is a summary. It may not cover all possible information. If you have  questions about this medicine, talk to your doctor, pharmacist, orhealth care provider.  2022 Elsevier/Gold Standard (2019-09-14 14:42:48)

## 2021-06-05 NOTE — Progress Notes (Signed)
Virtual Visit via Video Note  I connected with Colleen [name] on 06/05/21 at  9:30 AM EDT by a video enabled telemedicine application and verified that I am speaking with the correct person using two identifiers.  Location Provider Location : [facility] Patient Location : Home  Participants: Patient , Provider    I discussed the limitations of evaluation and management by telemedicine and the availability of in person appointments. The patient expressed understanding and agreed to proceed.    I discussed the assessment and treatment plan with the patient. The patient was provided an opportunity to ask questions and all were answered. The patient agreed with the plan and demonstrated an understanding of the instructions.   The patient was advised to call back or seek an in-person evaluation if the symptoms worsen or if the condition fails to improve as anticipated.  [place] MD OP Progress Note  06/05/2021 5:45 PM Colleen [name]  MRN:  [id number]  Chief Complaint:  Chief Complaint   Follow-up; Depression; Anxiety    HPI: Colleen [name] is a 60 year old Caucasian female, married, lives in [place], has a history of PTSD, MDD, insomnia, chronic back pain, COPD, radiation therapy per history, surgical history of nephrectomy, mastectomy, tubal ligation was evaluated by telemedicine today.  Patient today reports she continues to feel depressed and anxious.  She has had often, also has irritability, mood swings.  She was recently evaluated by Dr. De Nurse in the Saturday clinic.  I have reviewed notes dated 05/25/2021.  Patient's Cymbalta was increased to 120 mg daily at that visit.  Patient today returns reporting that she has not noticed much benefit from the dosage increase.  She reports she continues to have sleep problems also.  That also makes her tired and sluggish during the day.  She is in a lot of pain which also has an impact on her mood.  Patient during the session had bouts of cough  and reported that this may have been due to her COPD or seasonal allergies.  She is currently on treatment for the same.  Patient continues to be in therapy and reports she had a recent session with her therapist which is going well.  She denies any suicidality.  She continues to have relationship struggles with her husband which is a major trigger for her.     Visit Diagnosis:    ICD-10-CM   1. MDD (major depressive disorder), recurrent episode, moderate (HCC)  F33.1 QUEtiapine (SEROQUEL) 25 MG tablet    2. GAD (generalized anxiety disorder)  F41.1 QUEtiapine (SEROQUEL) 25 MG tablet    3. PTSD (post-traumatic stress disorder)  F43.10 QUEtiapine (SEROQUEL) 25 MG tablet    4. Insomnia due to medical condition  G47.01 QUEtiapine (SEROQUEL) 25 MG tablet   pain, anxiety,COPD      Past Psychiatric History: Reviewed past psychiatric history from progress note on 03/04/2021.  Past trials of Paxil, Klonopin, trazodone, prazosin, desvenlafaxine, lorazepam.  Past Medical History:  Past Medical History:  Diagnosis Date   Anxiety    Asthma    Breast cancer ([facility]) 2004   left breast   Chronic back pain    COPD (chronic obstructive pulmonary disease) ([facility])    Depression    Personal history of radiation therapy     Past Surgical History:  Procedure Laterality Date   BREAST BIOPSY Left 2004   positive   BREAST BIOPSY Right    neg   BREAST LUMPECTOMY Left 2004   positive   MASTECTOMY Left  MASTECTOMY     NEPHRECTOMY Left    TUBAL LIGATION      Family Psychiatric History: Reviewed family psychiatric history from progress note on 03/04/2021  Family History:  Family History  Problem Relation Age of Onset   Breast cancer Cousin    Breast cancer Cousin    Bipolar disorder Daughter    Drug abuse Daughter    Alcohol abuse Maternal Grandfather     Social History: Reviewed social history from progress note on 03/04/2021 Social History   Socioeconomic History   Marital status:  Married    Spouse name: Not on file   Number of children: 2   Years of education: GED   Highest education level: Not on file  Occupational History   Not on file  Tobacco Use   Smoking status: Every Day    Packs/day: 0.50    Types: Cigarettes   Smokeless tobacco: Never  Vaping Use   Vaping Use: Never used  Substance and Sexual Activity   Alcohol use: Yes   Drug use: Never   Sexual activity: Not Currently  Other Topics Concern   Not on file  Social History Narrative   Not on file   Social Determinants of Health   Financial Resource Strain: Not on file  Food Insecurity: Not on file  Transportation Needs: Not on file  Physical Activity: Not on file  Stress: Not on file  Social Connections: Not on file    Allergies:  Allergies  Allergen Reactions   Desvenlafaxine Anaphylaxis   Morphine And Related Anaphylaxis   Penicillins Anaphylaxis    Has patient had a PCN reaction causing immediate rash, facial/tongue/throat swelling, SOB or lightheadedness with hypotension: Yes Has patient had a PCN reaction causing severe rash involving mucus membranes or skin necrosis: No Has patient had a PCN reaction that required hospitalization: Unknown Has patient had a PCN reaction occurring within the last 10 years: No If all of the above answers are "NO", then may proceed with Cephalosporin use.    Prazosin Other (See Comments)   Tamoxifen Anaphylaxis   Trazodone Anaphylaxis   Clonazepam    Codeine Swelling   Hydroxyzine Itching   Lorazepam     Patient feels this medications makes her to sedated and wants to avoid   Paroxetine Hcl Hives   Sulfa Antibiotics Itching   Cephalexin Rash    Metabolic Disorder Labs: Lab Results  Component Value Date   HGBA1C 5.4 10/12/2018   MPG 108.28 10/12/2018   No results found for: PROLACTIN No results found for: CHOL, TRIG, HDL, CHOLHDL, VLDL, LDLCALC Lab Results  Component Value Date   TSH 0.616 03/05/2021    Therapeutic Level Labs: No  results found for: LITHIUM No results found for: VALPROATE No components found for:  CBMZ  Current Medications: Current Outpatient Medications  Medication Sig Dispense Refill   cetirizine-pseudoephedrine (ZYRTEC-D) 5-120 MG tablet Take 1 tablet by mouth 2 (two) times daily as needed for allergies.     Cholecalciferol 25 MCG (1000 UT) tablet Take by mouth.     denosumab (PROLIA) 60 MG/ML SOSY injection Inject 60 mg into the skin every 6 (six) months.     EPINEPHrine 0.3 mg/0.3 mL IJ SOAJ injection Inject 0.3 mg into the muscle as needed.     Fluticasone-Umeclidin-Vilant (TRELEGY ELLIPTA) 100-62.5-25 MCG/INH AEPB Inhale 1 puff into the lungs daily.     gabapentin (NEURONTIN) 300 MG capsule Take 1 capsule (300 mg total) by mouth 2 (two) times daily. 60 capsule  1   ipratropium (ATROVENT) 0.02 % nebulizer solution Take by nebulization 4 (four) times daily.     montelukast (SINGULAIR) 10 MG tablet Take 10 mg by mouth at bedtime.     QUEtiapine (SEROQUEL) 25 MG tablet Take 0.5-1 tablets (12.5-25 mg total) by mouth at bedtime. 30 tablet 0   VENTOLIN HFA 108 (90 Base) MCG/ACT inhaler Inhale 1-2 puffs into the lungs every 4 (four) hours as needed for cough or wheezing.     No current facility-administered medications for this visit.     Musculoskeletal: Strength & Muscle Tone:  UTA Gait & Station: normal Patient leans: N/A  Psychiatric Specialty Exam: Review of Systems  Respiratory:  Positive for cough.   Musculoskeletal:  Positive for arthralgias and back pain.  Psychiatric/Behavioral:  Positive for dysphoric mood and sleep disturbance. The patient is nervous/anxious.   All other systems reviewed and are negative.  There were no vitals taken for this visit.There is no height or weight on file to calculate BMI.  General Appearance: Casual  Eye Contact:  Good  Speech:  Clear and Coherent  Volume:  Normal  Mood:  Anxious and Depressed  Affect:  Congruent  Thought Process:  Goal Directed  and Descriptions of Associations: Intact  Orientation:  Full (Time, Place, and Person)  Thought Content: Logical   Suicidal Thoughts:  No  Homicidal Thoughts:  No  Memory:  Immediate;   Fair Recent;   Fair Remote;   Fair  Judgement:  Fair  Insight:  Fair  Psychomotor Activity:  Normal  Concentration:  Concentration: Fair and Attention Span: Fair  Recall:  [employer] of Knowledge: Fair  Language: Fair  Akathisia:  No  Handed:  Right  AIMS (if indicated): not done  Assets:  Communication Skills Desire for Improvement Social Support Talents/Skills  ADL's:  Intact  Cognition: WNL  Sleep:  Poor   Screenings: GAD-7    Flowsheet Row Video Visit from 04/03/2021 in [facility] Video Visit from 03/04/2021 in [facility]  Total GAD-7 Score 6 17      PHQ2-9    Flowsheet Row Video Visit from 06/05/2021 in [facility] Video Visit from 04/03/2021 in [facility] Video Visit from 03/04/2021 in [facility] Procedure visit from 12/19/2020 in [facility] Office Visit from 08/16/2020 in [facility]  PHQ-2 Total Score 6 0 6 0 0  PHQ-9 Total Score 13 -- 20 -- --      Flowsheet Row Video Visit from 05/25/2021 in [facility] ED from 05/05/2021 in [facility] Video Visit from 03/21/2021 in [facility] No Risk Error: Question 6 not populated No Risk        Assessment and Plan: Colleen [name] is a 60 year old Caucasian female, unemployed, married, has a history of anxiety, PTSD, chronic pain, multiple medical problems was evaluated by telemedicine today.  Patient continues to struggle with mood, sleep has multiple psychosocial stressors including  her health problems, chronic pain, relationship struggles with her husband.  She will benefit from the following plan.  Plan MDD-unstable Cymbalta was recently increased to 120 mg p.o. daily in divided dosage. We will continue the same.  Advised patient to give the medication more time. Add Seroquel 12.5-25 mg at bedtime.  She could start 1/2 tablet for a week and increase it to a  25 mg if she tolerates it well. Continue CBT with Ms. Shary Decamp  GAD-unstable Gabapentin 300 mg p.o. twice daily for now. Cymbalta was recently increased to 120 mg p.o. daily Add Seroquel as prescribed. Continue CBT  PTSD-improving Cymbalta as prescribed Continue CBT  Insomnia likely due to medical condition likely pain-unstable Added Seroquel 12.5-25 mg at bedtime  Patient will continue to need sufficient pain management.  We will consider referral for [facility] if she does not make progress.  Patient had her blood pressure and heart rate done in session today at home, blood pressure 116/84, pulse rate 96.  Patient advised to monitor her heart rate and also provided education about other side effects of Seroquel including tardive dyskinesia.  I have also reviewed EKG dated 05/05/2021-normal QTC, sinus tachycardia.  Follow-up in clinic in 2 weeks or sooner if needed.  This note was generated in part or whole with voice recognition software. Voice recognition is usually quite accurate but there are transcription errors that can and very often do occur. I apologize for any typographical errors that were not detected and corrected.        Ursula Alert, MD 06/07/2021, 1:52 PM

## 2021-06-06 ENCOUNTER — Telehealth: Payer: Self-pay | Admitting: Psychiatry

## 2021-06-06 ENCOUNTER — Other Ambulatory Visit: Payer: Self-pay | Admitting: Psychiatry

## 2021-06-06 DIAGNOSIS — F331 Major depressive disorder, recurrent, moderate: Secondary | ICD-10-CM

## 2021-06-06 DIAGNOSIS — F411 Generalized anxiety disorder: Secondary | ICD-10-CM

## 2021-06-06 DIAGNOSIS — F4310 Post-traumatic stress disorder, unspecified: Secondary | ICD-10-CM

## 2021-06-06 NOTE — Telephone Encounter (Signed)
Contacted patient to discuss that we are missing the first 3 pages of her genetic testing.  Left voicemail.

## 2021-06-17 ENCOUNTER — Other Ambulatory Visit: Payer: Self-pay

## 2021-06-17 ENCOUNTER — Ambulatory Visit (HOSPITAL_BASED_OUTPATIENT_CLINIC_OR_DEPARTMENT_OTHER): Admitting: Student in an Organized Health Care Education/Training Program

## 2021-06-17 ENCOUNTER — Ambulatory Visit
Admission: RE | Admit: 2021-06-17 | Discharge: 2021-06-17 | Disposition: A | Discharge status: Home / Self Care | Source: Ambulatory Visit | Attending: Student in an Organized Health Care Education/Training Program | Admitting: Student in an Organized Health Care Education/Training Program

## 2021-06-17 ENCOUNTER — Encounter: Payer: Self-pay | Admitting: Student in an Organized Health Care Education/Training Program

## 2021-06-17 VITALS — BP 150/99 | HR 100 | Temp 97.1°F | Resp 18 | Ht 60.0 in | Wt 135.0 lb

## 2021-06-17 DIAGNOSIS — G894 Chronic pain syndrome: Secondary | ICD-10-CM

## 2021-06-17 DIAGNOSIS — M47812 Spondylosis without myelopathy or radiculopathy, cervical region: Secondary | ICD-10-CM

## 2021-06-17 DIAGNOSIS — M5126 Other intervertebral disc displacement, lumbar region: Secondary | ICD-10-CM | POA: Diagnosis not present

## 2021-06-17 DIAGNOSIS — M5416 Radiculopathy, lumbar region: Secondary | ICD-10-CM

## 2021-06-17 MED ORDER — LIDOCAINE HCL 2 % IJ SOLN
20.0000 mL | Freq: Once | INTRAMUSCULAR | Status: AC
  Administered 2021-06-17: 200 mg

## 2021-06-17 MED ORDER — DEXAMETHASONE SODIUM PHOSPHATE 10 MG/ML IJ SOLN
INTRAMUSCULAR | Status: AC
  Filled 2021-06-17: qty 1

## 2021-06-17 MED ORDER — DEXAMETHASONE SODIUM PHOSPHATE 10 MG/ML IJ SOLN
10.0000 mg | Freq: Once | INTRAMUSCULAR | Status: AC
  Administered 2021-06-17: 10 mg

## 2021-06-17 MED ORDER — SODIUM CHLORIDE 0.9% FLUSH
2.0000 mL | Freq: Once | INTRAVENOUS | Status: AC
  Administered 2021-06-17: 2 mL

## 2021-06-17 MED ORDER — SODIUM CHLORIDE (PF) 0.9 % IJ SOLN
INTRAMUSCULAR | Status: AC
  Filled 2021-06-17: qty 10

## 2021-06-17 MED ORDER — LIDOCAINE HCL (PF) 2 % IJ SOLN
INTRAMUSCULAR | Status: AC
  Filled 2021-06-17: qty 10

## 2021-06-17 MED ORDER — ROPIVACAINE HCL 2 MG/ML IJ SOLN
INTRAMUSCULAR | Status: AC
  Filled 2021-06-17: qty 20

## 2021-06-17 MED ORDER — IOHEXOL 180 MG/ML  SOLN
10.0000 mL | Freq: Once | INTRAMUSCULAR | Status: AC
  Administered 2021-06-17: 5 mL via EPIDURAL

## 2021-06-17 MED ORDER — ROPIVACAINE HCL 2 MG/ML IJ SOLN
2.0000 mL | Freq: Once | INTRAMUSCULAR | Status: AC
  Administered 2021-06-17: 2 mL via EPIDURAL

## 2021-06-17 NOTE — Patient Instructions (Signed)

## 2021-06-17 NOTE — Progress Notes (Signed)
PROVIDER NOTE: Information contained herein reflects review and annotations entered in association with encounter. Interpretation of such information and data should be left to medically-trained personnel. Information provided to patient can be located elsewhere in the medical record under "Patient Instructions". Document created using STT-dictation technology, any transcriptional errors that may result from process are unintentional.    Patient: Colleen [name]  Service Category: Procedure  Provider: Gillis Santa, MD  DOB: 1961-04-02  DOS: 06/17/2021  Location: [facility]  MRN: [id number]  Setting: Ambulatory - outpatient  Referring Provider: Remi Haggard, FNP  Type: Established Patient  Specialty: Interventional Pain Management  PCP: Remi Haggard, FNP   Primary Reason for Visit: Interventional Pain Management Treatment. CC: Back Pain (low)  Procedure:          Anesthesia, Analgesia, Anxiolysis:  Type: Therapeutic Inter-Laminar Epidural Steroid Injection  #2 in 2022 Region: Lumbar Level: L4-5 Level. Laterality: [name]         Type: Local Anesthesia   Local Anesthetic: Lidocaine 1-2%  Position: Prone with head of the table was raised to facilitate breathing.   Indications: 1. 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, L5/S1)   2. Lumbar radiculopathy   3. Cervical facet joint syndrome (C3/4, C4/5)   4. Chronic pain syndrome    Pain Score: Pre-procedure: 8 /10 Post-procedure: 3  (standing, walking)/10   Pre-op Assessment:  Ms. Kusak is a 60 y.o. (year old), female patient, seen today for interventional treatment. She  has a past surgical history that includes Nephrectomy (Left); Mastectomy (Left); Tubal ligation; Mastectomy; Breast biopsy (Left, 2004); Breast biopsy ([name]); and Breast lumpectomy (Left, 2004). Ms. Arab has a current medication list which includes the following prescription(s): cetirizine-pseudoephedrine, cholecalciferol, denosumab, duloxetine,  epinephrine, trelegy ellipta, gabapentin, ipratropium, montelukast, quetiapine, and ventolin hfa. Her primarily concern today is the Back Pain (low)  Initial Vital Signs:  Pulse/HCG Rate: (!) 102  Temp: (!) 97.1 F (36.2 C) Resp: 16 BP: (!) 154/96 SpO2: 94 %  BMI: Estimated body mass index is 26.37 kg/m as calculated from the following:   Height as of this encounter: 5' (1.524 m).   Weight as of this encounter: 135 lb (61.2 kg).  Risk Assessment: Allergies: Reviewed. She is allergic to desvenlafaxine, morphine and related, penicillins, prazosin, tamoxifen, trazodone, clonazepam, codeine, hydroxyzine, lorazepam, paroxetine hcl, sulfa antibiotics, and cephalexin.  Allergy Precautions: None required Coagulopathies: Reviewed. None identified.  Blood-thinner therapy: None at this time Active Infection(s): Reviewed. None identified. Ms. Monohan is afebrile  Site Confirmation: Ms. Pazmino was asked to confirm the procedure and laterality before marking the site Procedure checklist: Completed Consent: Before the procedure and under the influence of no sedative(s), amnesic(s), or anxiolytics, the patient was informed of the treatment options, risks and possible complications. To fulfill our ethical and legal obligations, as recommended by the American Medical Association's Code of Ethics, I have informed the patient of my clinical impression; the nature and purpose of the treatment or procedure; the risks, benefits, and possible complications of the intervention; the alternatives, including doing nothing; the risk(s) and benefit(s) of the alternative treatment(s) or procedure(s); and the risk(s) and benefit(s) of doing nothing. The patient was provided information about the general risks and possible complications associated with the procedure. These may include, but are not limited to: failure to achieve desired goals, infection, bleeding, organ or nerve damage, allergic reactions, paralysis, and  death. In addition, the patient was informed of those risks and complications associated to Spine-related procedures, such as failure to decrease  pain; infection (i.e.: Meningitis, epidural or intraspinal abscess); bleeding (i.e.: epidural hematoma, subarachnoid hemorrhage, or any other type of intraspinal or peri-dural bleeding); organ or nerve damage (i.e.: Any type of peripheral nerve, nerve root, or spinal cord injury) with subsequent damage to sensory, motor, and/or autonomic systems, resulting in permanent pain, numbness, and/or weakness of one or several areas of the body; allergic reactions; (i.e.: anaphylactic reaction); and/or death. Furthermore, the patient was informed of those risks and complications associated with the medications. These include, but are not limited to: allergic reactions (i.e.: anaphylactic or anaphylactoid reaction(s)); adrenal axis suppression; blood sugar elevation that in diabetics may result in ketoacidosis or comma; water retention that in patients with history of congestive heart failure may result in shortness of breath, pulmonary edema, and decompensation with resultant heart failure; weight gain; swelling or edema; medication-induced neural toxicity; particulate matter embolism and blood vessel occlusion with resultant organ, and/or nervous system infarction; and/or aseptic necrosis of one or more joints. Finally, the patient was informed that Medicine is not an exact science; therefore, there is also the possibility of unforeseen or unpredictable risks and/or possible complications that may result in a catastrophic outcome. The patient indicated having understood very clearly. We have given the patient no guarantees and we have made no promises. Enough time was given to the patient to ask questions, all of which were answered to the patient's satisfaction. Ms. Nimmer has indicated that she wanted to continue with the procedure. Attestation: I, the ordering provider,  attest that I have discussed with the patient the benefits, risks, side-effects, alternatives, likelihood of achieving goals, and potential problems during recovery for the procedure that I have provided informed consent. Date  Time: 06/17/2021  8:20 AM  Pre-Procedure Preparation:  Monitoring: As per clinic protocol. Respiration, ETCO2, SpO2, BP, heart rate and rhythm monitor placed and checked for adequate function Safety Precautions: Patient was assessed for positional comfort and pressure points before starting the procedure. Time-out: I initiated and conducted the "Time-out" before starting the procedure, as per protocol. The patient was asked to participate by confirming the accuracy of the "Time Out" information. Verification of the correct person, site, and procedure were performed and confirmed by me, the nursing staff, and the patient. "Time-out" conducted as per Joint Commission's Universal Protocol (UP.01.01.01). Time: 0845  Description of Procedure:          Target Area: The interlaminar space, initially targeting the lower laminar border of the superior vertebral body. Approach: Paramedial approach. Area Prepped: Entire Posterior Lumbar Region DuraPrep (Iodine Povacrylex [0.7% available iodine] and Isopropyl Alcohol, 74% w/w) Safety Precautions: Aspiration looking for blood return was conducted prior to all injections. At no point did we inject any substances, as a needle was being advanced. No attempts were made at seeking any paresthesias. Safe injection practices and needle disposal techniques used. Medications properly checked for expiration dates. SDV (single dose vial) medications used. Description of the Procedure: Protocol guidelines were followed. The procedure needle was introduced through the skin, ipsilateral to the reported pain, and advanced to the target area. Bone was contacted and the needle walked caudad, until the lamina was cleared. The epidural space was identified  using "loss-of-resistance technique" with 2-3 ml of PF-NaCl (0.9% NSS), in a 5cc LOR glass syringe.  Vitals:   06/17/21 0824 06/17/21 0825 06/17/21 0845 06/17/21 0853  BP:  (!) 154/96 (!) 152/100 (!) 150/99  Pulse:  (!) 102 100 100  Resp:  '16 20 18  '$ Temp: (!) 97.1 F (  36.2 C)     SpO2:  94% 95% 96%  Weight: 135 lb (61.2 kg)     Height: 5' (1.524 m)       Start Time: 0846 hrs. End Time: 0850 hrs.  Materials:  Needle(s) Type: Epidural needle Gauge: 22G Length: 3.5-in Medication(s): Please see orders for medications and dosing details. 6 cc solution made of 3 cc of preservative-free saline, 2 cc of 0.2% ropivacaine, 1 cc of Decadron 10 mg/cc.  Imaging Guidance (Spinal):          Type of Imaging Technique: Fluoroscopy Guidance (Spinal) Indication(s): Assistance in needle guidance and placement for procedures requiring needle placement in or near specific anatomical locations not easily accessible without such assistance. Exposure Time: Please see nurses notes. Contrast: Before injecting any contrast, we confirmed that the patient did not have an allergy to iodine, shellfish, or radiological contrast. Once satisfactory needle placement was completed at the desired level, radiological contrast was injected. Contrast injected under live fluoroscopy. No contrast complications. See chart for type and volume of contrast used. Fluoroscopic Guidance: I was personally present during the use of fluoroscopy. "Tunnel Vision Technique" used to obtain the best possible view of the target area. Parallax error corrected before commencing the procedure. "Direction-depth-direction" technique used to introduce the needle under continuous pulsed fluoroscopy. Once target was reached, antero-posterior, oblique, and lateral fluoroscopic projection used confirm needle placement in all planes. Images permanently stored in EMR. Interpretation: I personally interpreted the imaging intraoperatively. Adequate needle  placement confirmed in multiple planes. Appropriate spread of contrast into desired area was observed. No evidence of afferent or efferent intravascular uptake. No intrathecal or subarachnoid spread observed. Permanent images saved into the patient's record.  Post-operative Assessment:  Post-procedure Vital Signs:  Pulse/HCG Rate: 100  Temp: (!) 97.1 F (36.2 C) Resp: 18 BP: (!) 150/99 SpO2: 96 %  EBL: None  Complications: No immediate post-treatment complications observed by team, or reported by patient.  Note: The patient tolerated the entire procedure well. A repeat set of vitals were taken after the procedure and the patient was kept under observation following institutional policy, for this type of procedure. Post-procedural neurological assessment was performed, showing return to baseline, prior to discharge. The patient was provided with post-procedure discharge instructions, including a section on how to identify potential problems. Should any problems arise concerning this procedure, the patient was given instructions to immediately contact us, at any time, without hesitation. In any case, we plan to contact the patient by telephone for a follow-up status report regarding this interventional procedure.  Comments:  No additional relevant information.  Plan of Care  Orders:  Orders Placed This Encounter  Procedures   DG PAIN [facility] C-ARM 1-60 MIN NO REPORT    Intraoperative interpretation by procedural physician at [place].    Standing Status:   Standing    Number of Occurrences:   1    Order Specific Question:   Reason for exam:    Answer:   Assistance in needle guidance and placement for procedures requiring needle placement in or near specific anatomical locations not easily accessible without such assistance.   Ambulatory referral to Physical Therapy    Referral Priority:   Routine    Referral Type:   Physical Medicine    Referral Reason:   Specialty Services  Required    Requested Specialty:   Physical Therapy    Number of Visits Requested:   1   Medications ordered for procedure: Meds ordered this  encounter  Medications   iohexol (OMNIPAQUE) 180 MG/ML injection 10 mL    Must be Myelogram-compatible. If not available, you may substitute with a water-soluble, non-ionic, hypoallergenic, myelogram-compatible radiological contrast medium.   lidocaine (XYLOCAINE) 2 % (with pres) injection 400 mg   ropivacaine (PF) 2 mg/mL (0.2%) (NAROPIN) injection 2 mL   sodium chloride flush (NS) 0.9 % injection 2 mL   dexamethasone (DECADRON) injection 10 mg   Medications administered: We administered iohexol, lidocaine, ropivacaine (PF) 2 mg/mL (0.2%), sodium chloride flush, and dexamethasone.  See the medical record for exact dosing, route, and time of administration.  Follow-up plan:   Return in about 4 weeks (around 07/15/2021) for F2F Eval PP.    Recent Visits Date Type Provider Dept  03/20/21 Telemedicine Gillis Santa, MD [facility]  Showing recent visits within past 90 days and meeting all other requirements Today's Visits Date Type Provider Dept  06/17/21 Procedure visit Gillis Santa, MD [facility]  Showing today's visits and meeting all other requirements Future Appointments Date Type Provider Dept  07/18/21 Appointment Gillis Santa, MD [facility]  Showing future appointments within next 90 days and meeting all other requirements Disposition: Discharge home  Discharge (Date  Time): 06/17/2021; 0858 hrs.   Primary Care Physician: Remi Haggard, FNP Location: [facility] Note by: Gillis Santa, MD Date: 06/17/2021; Time: 9:38 AM  Disclaimer:  Medicine is not an exact science. The only guarantee in medicine is that nothing is guaranteed. It is important to note that the decision to proceed with this intervention was based on the information collected from the patient. The Data and  conclusions were drawn from the patient's questionnaire, the interview, and the physical examination. Because the information was provided in large part by the patient, it cannot be guaranteed that it has not been purposely or unconsciously manipulated. Every effort has been made to obtain as much relevant data as possible for this evaluation. It is important to note that the conclusions that lead to this procedure are derived in large part from the available data. Always take into account that the treatment will also be dependent on availability of resources and existing treatment guidelines, considered by other Pain Management Practitioners as being common knowledge and practice, at the time of the intervention. For Medico-Legal purposes, it is also important to point out that variation in procedural techniques and pharmacological choices are the acceptable norm. The indications, contraindications, technique, and results of the above procedure should only be interpreted and judged by a Board-Certified Interventional Pain Specialist with extensive familiarity and expertise in the same exact procedure and technique.

## 2021-06-18 ENCOUNTER — Telehealth: Payer: Self-pay | Admitting: *Deleted

## 2021-06-18 NOTE — Telephone Encounter (Signed)
Attempted to call for post procedure follow-up. Message left. 

## 2021-06-19 ENCOUNTER — Encounter: Payer: Self-pay | Admitting: Psychiatry

## 2021-06-19 ENCOUNTER — Other Ambulatory Visit: Payer: Self-pay

## 2021-06-19 ENCOUNTER — Telehealth (INDEPENDENT_AMBULATORY_CARE_PROVIDER_SITE_OTHER): Admitting: Psychiatry

## 2021-06-19 DIAGNOSIS — F17200 Nicotine dependence, unspecified, uncomplicated: Secondary | ICD-10-CM

## 2021-06-19 DIAGNOSIS — F4310 Post-traumatic stress disorder, unspecified: Secondary | ICD-10-CM

## 2021-06-19 DIAGNOSIS — F3342 Major depressive disorder, recurrent, in full remission: Secondary | ICD-10-CM

## 2021-06-19 DIAGNOSIS — G4701 Insomnia due to medical condition: Secondary | ICD-10-CM

## 2021-06-19 DIAGNOSIS — F411 Generalized anxiety disorder: Secondary | ICD-10-CM | POA: Diagnosis not present

## 2021-06-19 MED ORDER — QUETIAPINE FUMARATE 25 MG PO TABS: 12.5000 mg | ORAL_TABLET | Freq: Every day | ORAL | 0 refills | Status: DC

## 2021-06-19 MED ORDER — GABAPENTIN 300 MG PO CAPS: 300.0000 mg | ORAL_CAPSULE | Freq: Two times a day (BID) | ORAL | 1 refills | Status: DC

## 2021-06-19 MED ORDER — DULOXETINE HCL 60 MG PO CPEP: 60.0000 mg | ORAL_CAPSULE | Freq: Two times a day (BID) | ORAL | 1 refills | Status: DC

## 2021-06-19 NOTE — Patient Instructions (Signed)
Managing the Challenge of Quitting Smoking Quitting smoking is a physical and mental challenge. You will face cravings, withdrawal symptoms, and temptation. Before quitting, work with your health care provider to make a plan that can help you manage quitting. Preparation canhelp you quit and keep you from giving in. How to manage lifestyle changes Managing stress Stress can make you want to smoke, and wanting to smoke may cause stress. It is important to find ways to manage your stress. You might try some of the following: Practice relaxation techniques. Breathe slowly and deeply, in through your nose and out through your mouth. Listen to music. Soak in a bath or take a shower. Imagine a peaceful place or vacation. Get some support. Talk with family or friends about your stress. Join a support group. Talk with a counselor or therapist. Get some physical activity. Go for a walk, run, or bike ride. Play a favorite sport. Practice yoga.  Medicines Talk with your health care provider about medicines that might help you dealwith cravings and make quitting easier for you. Relationships Social situations can be difficult when you are quitting smoking. To manage this, you can: Avoid parties and other social situations where people might be smoking. Avoid alcohol. Leave right away if you have the urge to smoke. Explain to your family and friends that you are quitting smoking. Ask for support and let them know you might be a bit grumpy. Plan activities where smoking is not an option. General instructions Be aware that many people gain weight after they quit smoking. However, not everyone does. To keep from gaining weight, have a plan in place before you quit and stick to the plan after you quit. Your plan should include: Having healthy snacks. When you have a craving, it may help to: Eat popcorn, carrots, celery, or other cut vegetables. Chew sugar-free gum. Changing how you eat. Eat small  portion sizes at meals. Eat 4-6 small meals throughout the day instead of 1-2 large meals a day. Be mindful when you eat. Do not watch television or do other things that might distract you as you eat. Exercising regularly. Make time to exercise each day. If you do not have time for a long workout, do short bouts of exercise for 5-10 minutes several times a day. Do some form of strengthening exercise, such as weight lifting. Do some exercise that gets your heart beating and causes you to breathe deeply, such as walking fast, running, swimming, or biking. This is very important. Drinking plenty of water or other low-calorie or no-calorie drinks. Drink 6-8 glasses of water daily.  How to recognize withdrawal symptoms Your body and mind may experience discomfort as you try to get used to not having nicotine in your system. These effects are called withdrawal symptoms. They may include: Feeling hungrier than normal. Having trouble concentrating. Feeling irritable or restless. Having trouble sleeping. Feeling depressed. Craving a cigarette. To manage withdrawal symptoms: Avoid places, people, and activities that trigger your cravings. Remember why you want to quit. Get plenty of sleep. Avoid coffee and other caffeinated drinks. These may worsen some of your symptoms. These symptoms may surprise you. But be assured that they are normal to havewhen quitting smoking. How to manage cravings Come up with a plan for how to deal with your cravings. The plan should include the following: A definition of the specific situation you want to deal with. An alternative action you will take. A clear idea for how this action will help. The   name of someone who might help you with this. Cravings usually last for 5-10 minutes. Consider taking the following actions to help you with your plan to deal with cravings: Keep your mouth busy. Chew sugar-free gum. Suck on hard candies or a straw. Brush your  teeth. Keep your hands and body busy. Change to a different activity right away. Squeeze or play with a ball. Do an activity or a hobby, such as making bead jewelry, practicing needlepoint, or working with wood. Mix up your normal routine. Take a short exercise break. Go for a quick walk or run up and down stairs. Focus on doing something kind or helpful for someone else. Call a friend or family member to talk during a craving. Join a support group. Contact a quitline. Where to find support To get help or find a support group: Call the National Cancer Institute's Smoking Quitline: 1-800-QUIT NOW (784-8669) Visit the website of the Substance Abuse and Mental Health Services Administration: www.samhsa.gov Text QUIT to SmokefreeTXT: 478848 Where to find more information Visit these websites to find more information on quitting smoking: National Cancer Institute: www.smokefree.gov American Lung Association: www.lung.org American Cancer Society: www.cancer.org Centers for Disease Control and Prevention: www.cdc.gov American Heart Association: www.heart.org Contact a health care provider if: You want to change your plan for quitting. The medicines you are taking are not helping. Your eating feels out of control or you cannot sleep. Get help right away if: You feel depressed or become very anxious. Summary Quitting smoking is a physical and mental challenge. You will face cravings, withdrawal symptoms, and temptation to smoke again. Preparation can help you as you go through these challenges. Try different techniques to manage stress, handle social situations, and prevent weight gain. You can deal with cravings by keeping your mouth busy (such as by chewing gum), keeping your hands and body busy, calling family or friends, or contacting a quitline for people who want to quit smoking. You can deal with withdrawal symptoms by avoiding places where people smoke, getting plenty of rest, and  avoiding drinks with caffeine. This information is not intended to replace advice given to you by your health care provider. Make sure you discuss any questions you have with your healthcare provider. Document Revised: 08/16/2019 Document Reviewed: 08/16/2019 Elsevier Patient Education  2022 [employer]  

## 2021-06-19 NOTE — Progress Notes (Signed)
Virtual Visit via Video Note  I connected with Colleen [name] on 06/19/21 at  3:00 PM EDT by a video enabled telemedicine application and verified that I am speaking with the correct person using two identifiers.  Location Provider Location : [facility] Patient Location : Home  Participants: Patient , Provider    I discussed the limitations of evaluation and management by telemedicine and the availability of in person appointments. The patient expressed understanding and agreed to proceed.    I discussed the assessment and treatment plan with the patient. The patient was provided an opportunity to ask questions and all were answered. The patient agreed with the plan and demonstrated an understanding of the instructions.   The patient was advised to call back or seek an in-person evaluation if the symptoms worsen or if the condition fails to improve as anticipated.    [facility] MD OP Progress Note  06/20/2021 7:06 PM Colleen [name]  MRN:  [id number]  Chief Complaint:  Chief Complaint   Follow-up; Depression; Anxiety    HPI: Colleen [name] is a 60 year old Caucasian female, married, lives in [place], has a history of PTSD, MDD, insomnia, chronic back pain, COPD, radiation therapy per history, surgical history of nephrectomy, mastectomy, tubal ligation was evaluated by telemedicine today.  Patient today reports she is currently doing well with regards to her depressive symptoms.  She feels the Cymbalta is effective.  She also takes the Seroquel as needed which helps.  She has only been taking the Seroquel as needed and not every night.  She denies side effects.  She continues to have anxiety, worrying about things however overall she is coping better.  She had her appointment with Dr. Holley Raring recently and had a steroid injection to her back.  Her pain is under control right now.  That also seems to help her mood.  She reports she will be starting physical therapy soon for her neck pain.  She  reports sleep is better.  She denies any suicidality, homicidality or perceptual disturbances.  Patient denies any other concerns today.  Visit Diagnosis:    ICD-10-CM   1. MDD (major depressive disorder), recurrent, in full remission ([place])  F33.42 QUEtiapine (SEROQUEL) 25 MG tablet    gabapentin (NEURONTIN) 300 MG capsule    DULoxetine (CYMBALTA) 60 MG capsule    2. GAD (generalized anxiety disorder)  F41.1 QUEtiapine (SEROQUEL) 25 MG tablet    gabapentin (NEURONTIN) 300 MG capsule    DULoxetine (CYMBALTA) 60 MG capsule    3. PTSD (post-traumatic stress disorder)  F43.10 QUEtiapine (SEROQUEL) 25 MG tablet    DULoxetine (CYMBALTA) 60 MG capsule    4. Insomnia due to medical condition  G47.01 QUEtiapine (SEROQUEL) 25 MG tablet   pain, anxiety,COPD    5. Tobacco use disorder  F17.200       Past Psychiatric History: Reviewed past psychiatric history from progress note on 03/04/2021.  Past trials of Paxil, Klonopin, trazodone, prazosin, desvenlafaxine, lorazepam  Past Medical History:  Past Medical History:  Diagnosis Date   Anxiety    Asthma    Breast cancer ([place]) 2004   left breast   Chronic back pain    COPD (chronic obstructive pulmonary disease) ([place])    Depression    Personal history of radiation therapy     Past Surgical History:  Procedure Laterality Date   BREAST BIOPSY Left 2004   positive   BREAST BIOPSY Right    neg   BREAST LUMPECTOMY Left 2004   positive  MASTECTOMY Left    MASTECTOMY     NEPHRECTOMY Left    TUBAL LIGATION      Family Psychiatric History: Reviewed family psychiatric history from progress note on 03/04/2021  Family History:  Family History  Problem Relation Age of Onset   Breast cancer Cousin    Breast cancer Cousin    Bipolar disorder Daughter    Drug abuse Daughter    Alcohol abuse Maternal Grandfather     Social History: Reviewed social history from progress note on 03/04/2021 Social History   Socioeconomic History    Marital status: Married    Spouse name: Not on file   Number of children: 2   Years of education: GED   Highest education level: Not on file  Occupational History   Not on file  Tobacco Use   Smoking status: Every Day    Packs/day: 0.50    Types: Cigarettes   Smokeless tobacco: Never  Vaping Use   Vaping Use: Never used  Substance and Sexual Activity   Alcohol use: Yes   Drug use: Never   Sexual activity: Not Currently  Other Topics Concern   Not on file  Social History Narrative   Not on file   Social Determinants of Health   Financial Resource Strain: Not on file  Food Insecurity: Not on file  Transportation Needs: Not on file  Physical Activity: Not on file  Stress: Not on file  Social Connections: Not on file    Allergies:  Allergies  Allergen Reactions   Desvenlafaxine Anaphylaxis   Morphine And Related Anaphylaxis   Penicillins Anaphylaxis    Has patient had a PCN reaction causing immediate rash, facial/tongue/throat swelling, SOB or lightheadedness with hypotension: Yes Has patient had a PCN reaction causing severe rash involving mucus membranes or skin necrosis: No Has patient had a PCN reaction that required hospitalization: Unknown Has patient had a PCN reaction occurring within the last 10 years: No If all of the above answers are "NO", then may proceed with Cephalosporin use.    Prazosin Other (See Comments)   Tamoxifen Anaphylaxis   Trazodone Anaphylaxis   Clonazepam    Codeine Swelling   Hydroxyzine Itching   Lorazepam     Patient feels this medications makes her to sedated and wants to avoid   Paroxetine Hcl Hives   Sulfa Antibiotics Itching   Cephalexin Rash    Metabolic Disorder Labs: Lab Results  Component Value Date   HGBA1C 5.4 10/12/2018   MPG 108.28 10/12/2018   No results found for: PROLACTIN No results found for: CHOL, TRIG, HDL, CHOLHDL, VLDL, LDLCALC Lab Results  Component Value Date   TSH 0.616 03/05/2021    Therapeutic  Level Labs: No results found for: LITHIUM No results found for: VALPROATE No components found for:  CBMZ  Current Medications: Current Outpatient Medications  Medication Sig Dispense Refill   cetirizine-pseudoephedrine (ZYRTEC-D) 5-120 MG tablet Take 1 tablet by mouth 2 (two) times daily as needed for allergies.     Cholecalciferol 25 MCG (1000 UT) tablet Take by mouth.     denosumab (PROLIA) 60 MG/ML SOSY injection Inject 60 mg into the skin every 6 (six) months.     DULoxetine (CYMBALTA) 60 MG capsule Take 1 capsule (60 mg total) by mouth 2 (two) times daily. 60 capsule 1   EPINEPHrine 0.3 mg/0.3 mL IJ SOAJ injection Inject 0.3 mg into the muscle as needed.     Fluticasone-Umeclidin-Vilant (TRELEGY ELLIPTA) 100-62.5-25 MCG/INH AEPB Inhale 1 puff  into the lungs daily.     gabapentin (NEURONTIN) 300 MG capsule Take 1 capsule (300 mg total) by mouth 2 (two) times daily. 60 capsule 1   ipratropium (ATROVENT) 0.02 % nebulizer solution Take by nebulization 4 (four) times daily.     montelukast (SINGULAIR) 10 MG tablet Take 10 mg by mouth at bedtime.     QUEtiapine (SEROQUEL) 25 MG tablet Take 0.5-1 tablets (12.5-25 mg total) by mouth at bedtime. 30 tablet 0   VENTOLIN HFA 108 (90 Base) MCG/ACT inhaler Inhale 1-2 puffs into the lungs every 4 (four) hours as needed for cough or wheezing.     No current facility-administered medications for this visit.     Musculoskeletal: Strength & Muscle Tone:  UTA Gait & Station: normal Patient leans: N/A  Psychiatric Specialty Exam: Review of Systems  Musculoskeletal:  Positive for back pain and neck pain.  Psychiatric/Behavioral:  The patient is nervous/anxious.   All other systems reviewed and are negative.  There were no vitals taken for this visit.There is no height or weight on file to calculate BMI.  General Appearance: Casual  Eye Contact:  Good  Speech:  Clear and Coherent  Volume:  Normal  Mood:  Anxious  Affect:  Congruent  Thought  Process:  Goal Directed and Descriptions of Associations: Intact  Orientation:  Full (Time, Place, and Person)  Thought Content: Logical   Suicidal Thoughts:  No  Homicidal Thoughts:  No  Memory:  Immediate;   Fair Recent;   Fair Remote;   Fair  Judgement:  Fair  Insight:  Fair  Psychomotor Activity:  Normal  Concentration:  Concentration: Fair and Attention Span: Fair  Recall:  [employer] of Knowledge: Fair  Language: Fair  Akathisia:  No  Handed:  Right  AIMS (if indicated): not done  Assets:  Communication Skills Desire for Improvement Housing Social Support  ADL's:  Intact  Cognition: WNL  Sleep:   Improving   Screenings: GAD-7    Flowsheet Row Video Visit from 06/19/2021 in [facility] Video Visit from 04/03/2021 in [facility] Video Visit from 03/04/2021 in [facility]  Total GAD-7 Score '3 6 17      '$ PHQ2-9    Flowsheet Row Video Visit from 06/19/2021 in [facility] Procedure visit from 06/17/2021 in [facility] Video Visit from 06/05/2021 in [facility] Video Visit from 04/03/2021 in [facility] Video Visit from 03/04/2021 in [facility]  PHQ-2 Total Score '1 2 6 '$ 0 6  PHQ-9 Total Score '2 5 13 '$ -- 20      Flowsheet Row Video Visit from 05/25/2021 in [facility] from 05/05/2021 in [facility] Video Visit from 03/21/2021 in [facility] No Risk Error: Question 6 not populated No Risk        Assessment and Plan: Colleen [name] is a 60 year old Caucasian female, unemployed, married, has a history of anxiety, PTSD, chronic pain, multiple medical problems was evaluated by telemedicine today.  Patient is  currently improving on the current medication regimen, also reports better pain control, continues to follow-up with her pain provider.  Plan as noted below.  Plan MDD in remission Cymbalta 120 mg p.o. twice daily Seroquel 12.5-25 mg at bedtime, she has been taking it only as needed Continue CBT with Ms. Shary Decamp  GAD-improving Cymbalta  as prescribed Gabapentin 300 mg p.o. twice daily Seroquel 12.5-25 mg at bedtime. Continue CBT  PTSD-improving Continue CBT  Insomnia likely due to pain-improving Seroquel 12.5-25 mg at bedtime  Tobacco use disorder-unstable Provided counseling for 5 minutes.  I have reviewed notes per Dr. Tacey Ruiz 06/17/2021-patient with lumbar disc herniation, lumbar radiculopathy, chronic facet joint syndrome, chronic pain syndrome-patient received injection-epidural.  Follow-up in clinic in 4 to 6 weeks.  This note was generated in part or whole with voice recognition software. Voice recognition is usually quite accurate but there are transcription errors that can and very often do occur. I apologize for any typographical errors that were not detected and corrected.     Ursula Alert, MD 06/20/2021, 7:06 PM

## 2021-07-02 ENCOUNTER — Encounter

## 2021-07-03 ENCOUNTER — Ambulatory Visit: Attending: Student in an Organized Health Care Education/Training Program

## 2021-07-03 DIAGNOSIS — M6281 Muscle weakness (generalized): Secondary | ICD-10-CM | POA: Diagnosis present

## 2021-07-03 DIAGNOSIS — R293 Abnormal posture: Secondary | ICD-10-CM | POA: Insufficient documentation

## 2021-07-03 DIAGNOSIS — M5412 Radiculopathy, cervical region: Secondary | ICD-10-CM | POA: Diagnosis present

## 2021-07-03 NOTE — Therapy (Signed)
[facility] [address] [place], [address] Phone: [phone]   Fax:  [phone]  Physical Therapy Evaluation  Patient Details  Name: Colleen [name] MRN: [id number] Date of Birth: 14-Jan-1961 Referring Provider (PT): Gillis Santa MD   Encounter Date: 07/03/2021   PT End of Session - 07/03/21 0905     Visit Number 1    Number of Visits 17    Date for PT Re-Evaluation 08/28/21    PT Start Time 0759    PT Stop Time 0846    PT Time Calculation (min) 47 min    Activity Tolerance Patient tolerated treatment well;No increased pain    Behavior During Therapy [facility] for tasks assessed/performed             Past Medical History:  Diagnosis Date   Anxiety    Asthma    Breast cancer ([place]) 2004   left breast   Chronic back pain    COPD (chronic obstructive pulmonary disease) ([place])    Depression    Personal history of radiation therapy     Past Surgical History:  Procedure Laterality Date   BREAST BIOPSY Left 2004   positive   BREAST BIOPSY Right    neg   BREAST LUMPECTOMY Left 2004   positive   MASTECTOMY Left    MASTECTOMY     NEPHRECTOMY Left    TUBAL LIGATION      There were no vitals filed for this visit.    Subjective Assessment - 07/03/21 0748     Subjective Pt is a 60 y.o. female referred to PT for R sided LBP with lumbar disc herniation L4/5, L5/S1 and lumbar radiculopathy and R sided neck pain. PMH includes: COPD, GAD, Chronic pain syndrome, age related osteoporosis, major depressive disorder, PTSD.    Pertinent History Pt is a 60 y.o. female referred to PT for R sided LBP with lumbar disc herniation L4/5, L5/S1 and lumbar radiculopathy. PMH includes: COPD, GAD, Chronic pain syndrome, age related osteoporosis, major depressive disorder, PTSD, hx of breast cancer. Pt does report that her cervical spine on R side is bigger issue than her low back since injections. Pt concerned about neck  pain > LBP due to being R handed and having issues with her grip. R sided radiculopathy for lumbar and cervical spine present. LBP is improved slightly by medication and stretching and has had injections helping her. Worsened by prolonged standing and walking, with cervical spine, pain is worsened with pushing/pulling with BUE use. Cervical spine described as sharp, tight, stinging, down into RUE. Median nerve distribution into the R hand. Has sensation but feels decreased compared to LUE. describes limited grip strength in RUE and is R handed. Reports this has been occuring for ~4 years ago. Has reported becoming constant for the past year. Pt does report head aches and has had 1 fal stepping onto the deck describing difficulty raising L leg. Describes head aches worsen as neck pain worsens. Head aches described as starting in neck and occiput to bilat temporal region. Pt's goal is to improve R hand feeling, Neck and back tension.    How long can you sit comfortably? 30 min    How long can you stand comfortably? 30 min on average    How long can you walk comfortably? Able to walk all day if not in pain. When in pain able to participate in community errands like grocery shopping.    Diagnostic tests X-ray  and MRI    Patient Stated Goals Reduce pain and improve RUE sensation and grip    Currently in Pain? Yes    Pain Score 3     Pain Location Back    Pain Orientation Lower;Right;Left    Pain Descriptors / Indicators Aching;Dull    Pain Type Chronic pain    Pain Radiating Towards Neck pain recorded at 6/10 and inbetween shoulder blades    Pain Onset More than a month ago    Pain Frequency Constant    Multiple Pain Sites Yes    Pain Location Neck    Pain Orientation Right;Distal;Proximal    Pain Descriptors / Indicators Sharp;Tingling;Shooting    Pain Type Chronic pain                [facility] PT Assessment - 07/03/21 0810       Assessment   Medical Diagnosis LBP and cervical Pain (R sided)     Referring Provider (PT) Gillis Santa MD    Onset Date/Surgical Date --   4 years ago but worsened in the past year   Hand Dominance Right    Next MD Visit September 8th    Prior Therapy Yes      Balance Screen   Has the patient fallen in the past 6 months Yes    How many times? 1    Has the patient had a decrease in activity level because of a fear of falling?  Yes    Is the patient reluctant to leave their home because of a fear of falling?  No      Prior Function   Level of Independence Independent    Vocation Unemployed    Leisure Read, walk, walk/play with dogs, yard work      Cognition   Overall Cognitive Status Within Functional Limits for tasks assessed            OBJECTIVE  Mental Status Patient's fund of knowledge is within normal limits for educational level.  SENSATION: Grossly intact to light touch bilateral UE as determined by testing dermatomes C2-T2, except C7 decreased to LT on RUE. Proprioception and hot/cold testing deferred on this date   MUSCULOSKELETAL: Tremor: None Bulk: Normal Tone: Normal  Posture Forward head posture, rounded shoulders.   Palpation  TTP along spinal column C2-C7, paraspinals on R cervical   Strength R/L 5*/5 Shoulder flexion (anterior deltoid/pec major/coracobrachialis, axillary n. (C5/6) and musculocutaneous n. (C5-7)) 4*/4 Shoulder abduction (deltoid/supraspinatus, axillary/suprascapular n, C5) 4*/4 Shoulder external rotation (infraspinatus/teres minor) 5/5 Shoulder internal rotation (subcapularis/lats/pec major) 5/5 Elbow flexion (biceps brachii, brachialis, brachioradialis, musculoskeletal n, C5/6) 4/5 Elbow extension (triceps, radial n, C7) 4/5 Wrist Extension (C6/7) 5/5 Wrist Flexion (C6/7) 5/5 Finger adduction (interossei, ulnar n, T1) Cervical isometrics are strong in all directions;  AROM R/L 65* Cervical Flexion 45* Cervical Extension 45/45* Cervical Lateral Flexion (Painful on R side) 45*/65  Cervical Rotation *Indicates pain, overpressure performed unless otherwise indicated  PROM R/L PROM > AROM *Indicates pain, overpressure performed unless otherwise indicated  Repeated Movements No centralization or peripheralization of symptoms with repeated cervical protraction and retraction.      Passive Accessory Intervertebral Motion (PAIVM) Pt reports reproduction of neck pain with CPA C2-L5. Generally hypomobile throughout  SPECIAL TESTS Spurlings A (ipsilateral lateral flexion/axial compression): R: Negative L: Negative Spurlings B (ipsilateral lateral flexion/contralateral rotation/axial compression): R: Positive L: Negative Distraction Test: Not examined  Hoffman Sign (cervical cord compression): R: Negative L: Negative ULTT Median: R: Positive L: Negative  ULTT Ulnar: R: Negative L: Negative ULTT Radial: R: Positive L: Negative  Clinical Prediction Rules  Diagnostic Cervical Radiculopathy ULTTa: Any one of the following: A) symptom reproduction; B) side-to-side difference >10 degrees in elbow extension; or C) with regard to involved/painful side: ipsilateral neck lateral flexion decreases symptoms and/or contralateral neck lateral flexion increases symptoms.  ROM: involved-side cervical rotation range of motion less than 60 degrees Distraction test: symptom reduction.  Spurling's A: symptom reproduction.   Number of Positive Criteria  Sensitivity  Specificity  Pos LR  Neg LR   Two  0.39 0.56 0.88 1.09   Three  0.39  0.94  6.1 0.65   Four  0.24 0.99  30.3 0.77   Pos LR = positive likelihood ratio. Neg LR = negative likelihood ratio.    Number of Positive Criteria  Sensitivity  Specificity  Pos LR  Neg LR  Probability of Success w/ traction + exercise  One 0.07 0.97 1.15 0.21 47.6%  Two  0.3 0.97 1.44 0.40 53.2%  Three 0.63 0.87 4.81 0.42 79.2%  Four  0.3 1.0 23.1 0.71 94.8%  Pos LR = positive likelihood ratio. Neg LR = negative likelihood ratio.  Having at least  3 out of 5 predictors appears to be the optimal threshold for choosing the cervical traction as an intervention.    FOTO: 40/52      Objective measurements completed on examination: See above findings.      PT Education - 07/03/21 0905     Education Details Initiate HEP, POC.    Person(s) Educated Patient    Methods Explanation;Demonstration;Handout;Verbal cues;Tactile cues    Comprehension Verbalized understanding;Returned demonstration              PT Short Term Goals - 07/03/21 0933       PT SHORT TERM GOAL #1   Title Pt will complete HEP as indicated to improve pain, strength, and function.    Baseline 8/24: Initiated    Time 4    Period Weeks    Status New    Target Date 07/31/21               PT Long Term Goals - 07/03/21 0943       PT LONG TERM GOAL #1   Title Pt will improve FOTO to target score to indiciate clinicailly significant improvement in function.    Baseline 8/24: 40/52    Time 8    Period Weeks    Status New    Target Date 08/28/21      PT LONG TERM GOAL #2   Title Pt will improve cervical AROM to WNL's to improve pain and overhead function with ADL's    Baseline 8/24:  65 flex (WNL's), 45 ext, 45/45 lat bending (WNL's), rotation R/L 45/65    Time 8    Period Weeks    Status New    Target Date 08/28/21      PT LONG TERM GOAL #3   Title Pt will improve grip strength on R hand = to L hand to reduce reports of dropping items with tactile ADL tasks    Baseline 8/24: next session    Time 8    Period Weeks    Status New    Target Date 08/28/21      PT LONG TERM GOAL #4   Title Pt will report < 4/10 NPS in post neck pain and RUE pain with overhead ADL's.    Baseline 8/24: up to 8/10  NPS    Time 8    Period Weeks    Status New    Target Date 08/28/21                    Plan - 07/03/21 0908     Clinical Impression Statement Pt is a 60 y.o. female referred to PT for R cervical and lumbar radiculopathy. Per pt, R  lumbar is doing better with pt wishing to focus on R cervical pain and radiculopathy. Lower quarter screen performed with no adverse findings with proximal hip weakness but normal sensation throughout dermatomes with (-) slump bilaterally. Pt presents with upper quarter neuro screen with sensation decreased in C7 distribution along with weakness in elbow and wrist extension when comapred to L side. Pt also displays limited cervical AROM and concordant pain with SPurling's B into RUE indicative of cervical radiculopathy. With cervical flexion and extension and joint psrining in cervical spine, pain reproduced from cerivcal region to lumbar region on R side indicative that both regions are connected. Pt also (+) radial nerve tension test and median nerve bias and (-) on LUE. Pt has forward head posture and rounded hsoudlers with hypomobility noted that is concordant from C2-L1 on R side. Overall pt presents with cervical radicualr pain in C7 distribution that is limiting pt's ability to complete overhead ADL's and community errands due to difficulty with prolonged standing and walking due to LBP.    Personal Factors and Comorbidities Age;Time since onset of injury/illness/exacerbation;Comorbidity 3+;Education;Past/Current Experience;Fitness    Comorbidities PMH includes: COPD, GAD, Chronic pain syndrome, age related osteoporosis, major depressive disorder, PTSD, breast cx.    Examination-Activity Limitations Stand;Locomotion Level    Examination-Participation Restrictions Shop;Community Activity;Yard Work    Stability/Clinical Decision Making Stable/Uncomplicated    Designer, jewellery Low    Rehab Potential Good    PT Frequency 2x / week    PT Duration 8 weeks    PT Treatment/Interventions Patient/family education;ADLs/Self Care Home Management;Aquatic Therapy;Canalith Repostioning;Cryotherapy;Electrical Stimulation;Traction;Moist Heat;Functional mobility training;Therapeutic activities;Therapeutic  exercise;Neuromuscular re-education;Balance training;Gait training;Passive range of motion;Dry needling;Manual techniques;Energy conservation;Spinal Manipulations;Joint Manipulations    PT Next Visit Plan Please educate pt on radial and median nerve glides and assess grip strength!    PT Home Exercise Plan Access Code: [email or web address]    Consulted and Agree with Plan of Care Patient             [facility] PT Assessment - 07/03/21 0810       Assessment   Medical Diagnosis LBP and cervical Pain (R sided)    Referring Provider (PT) Gillis Santa MD    Onset Date/Surgical Date --   4 years ago but worsened in the past year   Hand Dominance Right    Next MD Visit September 8th    Prior Therapy Yes      Balance Screen   Has the patient fallen in the past 6 months Yes    How many times? 1    Has the patient had a decrease in activity level because of a fear of falling?  Yes    Is the patient reluctant to leave their home because of a fear of falling?  No      Prior Function   Level of Independence Independent    Vocation Unemployed    Leisure Read, walk, walk/play with dogs, yard work      Cognition   Overall Cognitive Status Within Functional Limits for tasks assessed  Patient will benefit from skilled therapeutic intervention in order to improve the following deficits and impairments:  Pain, Improper body mechanics, Impaired sensation, Decreased mobility, Postural dysfunction, Decreased activity tolerance, Decreased range of motion, Decreased strength, Hypomobility  Visit Diagnosis: Abnormal posture  Radiculopathy, cervical region  Muscle weakness (generalized)     Problem List Patient Active Problem List   Diagnosis Date Noted   Tobacco use disorder 06/19/2021   Insomnia due to medical condition 03/21/2021   MDD (major depressive disorder), recurrent episode, moderate ([place]) 03/04/2021   PTSD (post-traumatic stress disorder) 03/04/2021   Myofascial pain syndrome  07/09/2020   Lumbar radiculopathy 07/09/2020   Chronic pain syndrome 06/04/2020   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, L5/S1) 06/04/2020   Lumbar facet arthropathy (L4,5 S1) 06/04/2020   Cervical facet joint syndrome 06/04/2020   Osteopenia of multiple sites 06/04/2020   Age related osteoporosis 06/04/2020   Low back pain 04/27/2020   COPD exacerbation ([place]) 03/20/2020   Asthma, chronic, unspecified asthma severity, with acute exacerbation 03/20/2020   Panic attack 03/20/2020   Acute on chronic respiratory failure with hypoxia ([place])    Sepsis ([place]) 10/11/2018   CAP (community acquired pneumonia) 10/11/2018   COPD (chronic obstructive pulmonary disease) ([place]) 10/11/2018   GAD (generalized anxiety disorder) 10/11/2018    Salem Caster. Fairly IV, PT, DPT Physical Therapist- [facility]  07/03/2021, 12:56 PM  [facility] [address] [place], [address] Phone: [phone]   Fax:  [phone]  Name: Colleen [name] MRN: [id number] Date of Birth: [birth year]

## 2021-07-04 ENCOUNTER — Encounter

## 2021-07-09 ENCOUNTER — Ambulatory Visit

## 2021-07-09 DIAGNOSIS — R293 Abnormal posture: Secondary | ICD-10-CM | POA: Diagnosis not present

## 2021-07-09 DIAGNOSIS — M6281 Muscle weakness (generalized): Secondary | ICD-10-CM

## 2021-07-09 DIAGNOSIS — M5412 Radiculopathy, cervical region: Secondary | ICD-10-CM

## 2021-07-09 NOTE — Therapy (Addendum)
[facility] [address], [place], [address] Phone: [phone]   Fax:  [phone]  Physical Therapy Treatment  Patient Details  Name: Colleen [name] MRN: [id number] Date of Birth: [birth year] Referring Provider (PT): Gillis Santa MD   Encounter Date: 07/09/2021   PT End of Session - 07/09/21 0852     Visit Number 2    Number of Visits 17    Date for PT Re-Evaluation 08/28/21    Authorization Type 07/03/21-08/28/21    Authorization Time Period [employer]    PT Start Time 0803    PT Stop Time 0846    PT Time Calculation (min) 43 min    Activity Tolerance Patient tolerated treatment well;No increased pain    Behavior During Therapy [facility] for tasks assessed/performed             Past Medical History:  Diagnosis Date   Anxiety    Asthma    Breast cancer ([place]) 2004   left breast   Chronic back pain    COPD (chronic obstructive pulmonary disease) ([place])    Depression    Personal history of radiation therapy     Past Surgical History:  Procedure Laterality Date   BREAST BIOPSY Left 2004   positive   BREAST BIOPSY Right    neg   BREAST LUMPECTOMY Left 2004   positive   MASTECTOMY Left    MASTECTOMY     NEPHRECTOMY Left    TUBAL LIGATION      There were no vitals filed for this visit.   Subjective Assessment - 07/09/21 0806     Subjective Pt back for 2nd visit. She reports flare of allergies, nasal drip, acute on chronic inner-ear related imbalance. Pt has been working on cervical retraction, scapular retraction. Has some unconcerning soreness in lower cnetral neck with retraction.    Pertinent History Pt is a 60 y.o. female referred to PT for R sided LBP with lumbar disc herniation L4/5, L5/S1 and lumbar radiculopathy. PMH includes: COPD, GAD, Chronic pain syndrome, age related osteoporosis, major depressive disorder, PTSD, hx of breast cancer. Pt does report that her cervical spine on R side is  bigger issue than her low back since injections. Pt concerned about neck pain > LBP due to being R handed and having issues with her grip. R sided radiculopathy for lumbar and cervical spine present. LBP is improved slightly by medication and stretching and has had injections helping her. Worsened by prolonged standing and walking, with cervical spine, pain is worsened with pushing/pulling with BUE use. Cervical spine described as sharp, tight, stinging, down into RUE. Median nerve distribution into the R hand. Has sensation but feels decreased compared to LUE. describes limited grip strength in RUE and is R handed. Reports this has been occuring for ~4 years ago. Has reported becoming constant for the past year. Pt does report head aches and has had 1 fal stepping onto the deck describing difficulty raising L leg. Describes head aches worsen as neck pain worsens. Head aches described as starting in neck and occiput to bilat temporal region. Pt's goal is to improve R hand feeling, Neck and back tension.    Diagnostic tests X-ray and MRI    Patient Stated Goals Reduce pain and improve RUE sensation and grip    Currently in Pain? Yes    Pain Score 6     Pain Location --   Rt central low back pain near SIJ (chronic  near where she has her injections)               [facility] PT Assessment - 07/09/21 0001       Sensation   Light Touch Impaired Detail   reduced at tips of Rt digits 2, 3   Additional Comments tingling      Posture/Postural Control   Posture/Postural Control Postural limitations    Postural Limitations Rounded Shoulders    Posture Comments in supine, elevated (protracted)  bilat shoulders Rt>Lt    taught and tender pec minor bilat     ROM / Strength   AROM / PROM / Strength Strength      Strength   Strength Assessment Site Hand    Right/Left hand Right;Left    Right Hand Grip (lbs) 33, 35, 31     Right Hand 3 Point Pinch 11 lbs     Left Hand Grip (lbs) 41, 40, 41    Left Hand 3  Point Pinch 15 lbs              INTERVENTION THIS DATE: -Median Nerve Tentioner Testing- Pt has normal nerve mobility, no limitations, did not add to HEP -Supine pec minor stretch 3x30sec (active retraction) -Scapulothoracic mobilization, wand flexion on axial towel roll in hooklying: 1x20 *confirms some stretch, does moblize her stiff lumbothoracic junction           PT Education - 07/09/21 0851     Education Details Modifying sleep postures to eliminate sustained median nerve traction    Person(s) Educated Patient    Methods Explanation;Demonstration    Comprehension Verbalized understanding;Need further instruction              PT Short Term Goals - 07/03/21 0933       PT SHORT TERM GOAL #1   Title Pt will complete HEP as indicated to improve pain, strength, and function.    Baseline 8/24: Initiated    Time 4    Period Weeks    Status New    Target Date 07/31/21               PT Long Term Goals - 07/03/21 0943       PT LONG TERM GOAL #1   Title Pt will improve FOTO to target score to indiciate clinicailly significant improvement in function.    Baseline 8/24: 40/52    Time 8    Period Weeks    Status New    Target Date 08/28/21      PT LONG TERM GOAL #2   Title Pt will improve cervical AROM to WNL's to improve pain and overhead function with ADL's    Baseline 8/24:  65 flex (WNL's), 45 ext, 45/45 lat bending (WNL's), rotation R/L 45/65    Time 8    Period Weeks    Status New    Target Date 08/28/21      PT LONG TERM GOAL #3   Title Pt will improve grip strength on R hand = to L hand to reduce reports of dropping items with tactile ADL tasks    Baseline 8/24: next session    Time 8    Period Weeks    Status New    Target Date 08/28/21      PT LONG TERM GOAL #4   Title Pt will report < 4/10 NPS in post neck pain and RUE pain with overhead ADL's.    Baseline 8/24: up to 8/10 NPS  Time 8    Period Weeks    Status New    Target  Date 08/28/21                   Plan - 07/09/21 0854     Clinical Impression Statement Continued with remaining tests and measures from evaluation visit. Pt's Rt finger tips paresthesias are stable in session, no change with any intervention or testing. Pt educated on floor based stretching actiivty to achieve scapulothoracic mobilization and pec minor stretch. Shoulder mobility appears excellent. Pt to report feedback on response to stretches next date. Based on additional findings, unclear if related radicular presentation, but strong suspicion of carpal tunnel component.    Personal Factors and Comorbidities Age;Time since onset of injury/illness/exacerbation;Comorbidity 3+;Education;Past/Current Experience;Fitness    Comorbidities PMH includes: COPD, GAD, Chronic pain syndrome, age related osteoporosis, major depressive disorder, PTSD, breast cx.    Examination-Activity Limitations Stand;Locomotion Level    Examination-Participation Restrictions Shop;Community Activity;Yard Work    Stability/Clinical Decision Making Stable/Uncomplicated    Designer, jewellery Low    Rehab Potential Good    PT Frequency 2x / week    PT Duration 8 weeks    PT Treatment/Interventions Patient/family education;ADLs/Self Care Home Management;Aquatic Therapy;Canalith Repostioning;Cryotherapy;Electrical Stimulation;Traction;Moist Heat;Functional mobility training;Therapeutic activities;Therapeutic exercise;Neuromuscular re-education;Balance training;Gait training;Passive range of motion;Dry needling;Manual techniques;Energy conservation;Spinal Manipulations;Joint Manipulations    PT Next Visit Plan Continued with generalized scapulothoracic ROM progression and strengthening.    PT Home Exercise Plan Access Code: [email or web address]    Consulted and Agree with Plan of Care Patient             Patient will benefit from skilled therapeutic intervention in order to improve the following deficits and  impairments:  Pain, Improper body mechanics, Impaired sensation, Decreased mobility, Postural dysfunction, Decreased activity tolerance, Decreased range of motion, Decreased strength, Hypomobility  Visit Diagnosis: Abnormal posture  Radiculopathy, cervical region  Muscle weakness (generalized)     Problem List Patient Active Problem List   Diagnosis Date Noted   Tobacco use disorder 06/19/2021   Insomnia due to medical condition 03/21/2021   MDD (major depressive disorder), recurrent episode, moderate ([place]) 03/04/2021   PTSD (post-traumatic stress disorder) 03/04/2021   Myofascial pain syndrome 07/09/2020   Lumbar radiculopathy 07/09/2020   Chronic pain syndrome 06/04/2020   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, L5/S1) 06/04/2020   Lumbar facet arthropathy (L4,5 S1) 06/04/2020   Cervical facet joint syndrome 06/04/2020   Osteopenia of multiple sites 06/04/2020   Age related osteoporosis 06/04/2020   Low back pain 04/27/2020   COPD exacerbation ([place]) 03/20/2020   Asthma, chronic, unspecified asthma severity, with acute exacerbation 03/20/2020   Panic attack 03/20/2020   Acute on chronic respiratory failure with hypoxia ([place])    Sepsis ([place]) 10/11/2018   CAP (community acquired pneumonia) 10/11/2018   COPD (chronic obstructive pulmonary disease) ([place]) 10/11/2018   GAD (generalized anxiety disorder) 10/11/2018   9:09 AM, 07/09/21 Etta Grandchild, PT, DPT Physical Therapist - [place] [phone] (Office)   Altheria Shadoan C 07/09/2021, 9:06 AM  [facility] [address] [place], [address] Phone: [phone]   Fax:  [phone]  Name: Santiana Abril MRN: [id number] Date of Birth: 14-Aug-1961

## 2021-07-11 ENCOUNTER — Ambulatory Visit

## 2021-07-16 ENCOUNTER — Encounter

## 2021-07-17 ENCOUNTER — Ambulatory Visit

## 2021-07-18 ENCOUNTER — Encounter

## 2021-07-18 ENCOUNTER — Ambulatory Visit: Admitting: Student in an Organized Health Care Education/Training Program

## 2021-07-23 ENCOUNTER — Ambulatory Visit

## 2021-07-25 ENCOUNTER — Encounter

## 2021-07-29 ENCOUNTER — Encounter: Payer: Self-pay | Admitting: Psychiatry

## 2021-07-29 ENCOUNTER — Other Ambulatory Visit: Payer: Self-pay

## 2021-07-29 ENCOUNTER — Telehealth (INDEPENDENT_AMBULATORY_CARE_PROVIDER_SITE_OTHER): Admitting: Psychiatry

## 2021-07-29 DIAGNOSIS — F4310 Post-traumatic stress disorder, unspecified: Secondary | ICD-10-CM

## 2021-07-29 DIAGNOSIS — F17200 Nicotine dependence, unspecified, uncomplicated: Secondary | ICD-10-CM

## 2021-07-29 DIAGNOSIS — F411 Generalized anxiety disorder: Secondary | ICD-10-CM | POA: Diagnosis not present

## 2021-07-29 DIAGNOSIS — G4701 Insomnia due to medical condition: Secondary | ICD-10-CM | POA: Diagnosis not present

## 2021-07-29 DIAGNOSIS — F3342 Major depressive disorder, recurrent, in full remission: Secondary | ICD-10-CM | POA: Diagnosis not present

## 2021-07-29 MED ORDER — QUETIAPINE FUMARATE 25 MG PO TABS: 12.5000 mg | ORAL_TABLET | Freq: Every day | ORAL | 0 refills | Status: DC

## 2021-07-29 MED ORDER — GABAPENTIN 300 MG PO CAPS
300.0000 mg | ORAL_CAPSULE | Freq: Two times a day (BID) | ORAL | 0 refills | Status: DC
Start: 2021-07-29 — End: 2021-09-23

## 2021-07-29 MED ORDER — DULOXETINE HCL 60 MG PO CPEP: 60.0000 mg | ORAL_CAPSULE | Freq: Two times a day (BID) | ORAL | 0 refills | Status: DC

## 2021-07-29 NOTE — Progress Notes (Signed)
Virtual Visit via Video Note  I connected with Colleen [name] on 07/29/21 at  3:00 PM EDT by a video enabled telemedicine application and verified that I am speaking with the correct person using two identifiers.  Location Provider Location : [facility] Patient Location : Home  Participants: Patient , Provider   I discussed the limitations of evaluation and management by telemedicine and the availability of in person appointments. The patient expressed understanding and agreed to proceed.    I discussed the assessment and treatment plan with the patient. The patient was provided an opportunity to ask questions and all were answered. The patient agreed with the plan and demonstrated an understanding of the instructions.   The patient was advised to call back or seek an in-person evaluation if the symptoms worsen or if the condition fails to improve as anticipated.   [place] MD OP Progress Note  07/29/2021 3:09 PM Colleen [name]  MRN:  [id number]  Chief Complaint:  Chief Complaint   Follow-up; Anxiety; Depression    HPI: Colleen [name] is a 60 year old Caucasian female, married, lives in [place], has a history of PTSD, MDD, insomnia, chronic back pain, COPD, radiation therapy per history, surgical history of nephrectomy, mastectomy, tubal ligation was evaluated by telemedicine today.  Patient today reports she is currently making progress on the current medication regimen.  She does have panic attacks, had one this morning was triggered by her pain.  However she was able to cope with it better.  She took a dose of gabapentin and after 45 minutes or so her pain got manageable which did help with her panic symptoms.  She reports sleep is good.  She takes a Seroquel as needed only.  Patient is compliant on the Cymbalta.  Denies side effects.  Patient denies any significant mood swings, sadness, irritability.  She continues to follow-up with her therapist Ms. Shary Decamp.  She does have an  appointment with her pain provider Dr. Holley Raring beginning of October.  Patient is trying to quit smoking.  She reports she has been successful in cutting back.  Patient denies any other concerns today.  Visit Diagnosis:    ICD-10-CM   1. MDD (major depressive disorder), recurrent, in full remission ([place])  F33.42 QUEtiapine (SEROQUEL) 25 MG tablet    gabapentin (NEURONTIN) 300 MG capsule    DULoxetine (CYMBALTA) 60 MG capsule    2. GAD (generalized anxiety disorder)  F41.1 QUEtiapine (SEROQUEL) 25 MG tablet    gabapentin (NEURONTIN) 300 MG capsule    DULoxetine (CYMBALTA) 60 MG capsule    3. PTSD (post-traumatic stress disorder)  F43.10 QUEtiapine (SEROQUEL) 25 MG tablet    DULoxetine (CYMBALTA) 60 MG capsule    4. Insomnia due to medical condition  G47.01    Pain, anxiety,COPD    5. Tobacco use disorder  F17.200       Past Psychiatric History: Reviewed past psychiatric history from progress note on 03/04/2021.  Past trials of Paxil, Klonopin, trazodone, prazosin, desvenlafaxine, lorazepam  Past Medical History:  Past Medical History:  Diagnosis Date   Anxiety    Asthma    Breast cancer ([place]) 2004   left breast   Chronic back pain    COPD (chronic obstructive pulmonary disease) ([place])    Depression    Personal history of radiation therapy     Past Surgical History:  Procedure Laterality Date   BREAST BIOPSY Left 2004   positive   BREAST BIOPSY Right    neg   BREAST LUMPECTOMY  Left 2004   positive   MASTECTOMY Left    MASTECTOMY     NEPHRECTOMY Left    TUBAL LIGATION      Family Psychiatric History: Reviewed family psychiatric history from progress note on 03/04/2021  Family History:  Family History  Problem Relation Age of Onset   Breast cancer Cousin    Breast cancer Cousin    Bipolar disorder Daughter    Drug abuse Daughter    Alcohol abuse Maternal Grandfather     Social History: Reviewed social history from progress note on 03/04/2021 Social History    Socioeconomic History   Marital status: Married    Spouse name: Not on file   Number of children: 2   Years of education: GED   Highest education level: Not on file  Occupational History   Not on file  Tobacco Use   Smoking status: Every Day    Packs/day: 0.50    Types: Cigarettes   Smokeless tobacco: Never  Vaping Use   Vaping Use: Never used  Substance and Sexual Activity   Alcohol use: Yes   Drug use: Never   Sexual activity: Not Currently  Other Topics Concern   Not on file  Social History Narrative   Not on file   Social Determinants of Health   Financial Resource Strain: Not on file  Food Insecurity: Not on file  Transportation Needs: Not on file  Physical Activity: Not on file  Stress: Not on file  Social Connections: Not on file    Allergies:  Allergies  Allergen Reactions   Desvenlafaxine Anaphylaxis   Morphine And Related Anaphylaxis   Penicillins Anaphylaxis    Has patient had a PCN reaction causing immediate rash, facial/tongue/throat swelling, SOB or lightheadedness with hypotension: Yes Has patient had a PCN reaction causing severe rash involving mucus membranes or skin necrosis: No Has patient had a PCN reaction that required hospitalization: Unknown Has patient had a PCN reaction occurring within the last 10 years: No If all of the above answers are "NO", then may proceed with Cephalosporin use.    Prazosin Other (See Comments)   Tamoxifen Anaphylaxis   Trazodone Anaphylaxis   Clonazepam    Codeine Swelling   Hydroxyzine Itching   Lorazepam     Patient feels this medications makes her to sedated and wants to avoid   Paroxetine Hcl Hives   Sulfa Antibiotics Itching   Cephalexin Rash    Metabolic Disorder Labs: Lab Results  Component Value Date   HGBA1C 5.4 10/12/2018   MPG 108.28 10/12/2018   No results found for: PROLACTIN No results found for: CHOL, TRIG, HDL, CHOLHDL, VLDL, LDLCALC Lab Results  Component Value Date   TSH 0.616  03/05/2021    Therapeutic Level Labs: No results found for: LITHIUM No results found for: VALPROATE No components found for:  CBMZ  Current Medications: Current Outpatient Medications  Medication Sig Dispense Refill   cetirizine-pseudoephedrine (ZYRTEC-D) 5-120 MG tablet Take 1 tablet by mouth 2 (two) times daily as needed for allergies.     Cholecalciferol 25 MCG (1000 UT) tablet Take by mouth.     denosumab (PROLIA) 60 MG/ML SOSY injection Inject 60 mg into the skin every 6 (six) months.     DULoxetine (CYMBALTA) 60 MG capsule Take 1 capsule (60 mg total) by mouth 2 (two) times daily. 180 capsule 0   EPINEPHrine 0.3 mg/0.3 mL IJ SOAJ injection Inject 0.3 mg into the muscle as needed.     Fluticasone-Umeclidin-Vilant (TRELEGY  ELLIPTA) 100-62.5-25 MCG/INH AEPB Inhale 1 puff into the lungs daily.     gabapentin (NEURONTIN) 300 MG capsule Take 1 capsule (300 mg total) by mouth 2 (two) times daily. 180 capsule 0   ipratropium (ATROVENT) 0.02 % nebulizer solution Take by nebulization 4 (four) times daily.     montelukast (SINGULAIR) 10 MG tablet Take 10 mg by mouth at bedtime.     QUEtiapine (SEROQUEL) 25 MG tablet Take 0.5-1 tablets (12.5-25 mg total) by mouth at bedtime. 90 tablet 0   VENTOLIN HFA 108 (90 Base) MCG/ACT inhaler Inhale 1-2 puffs into the lungs every 4 (four) hours as needed for cough or wheezing.     No current facility-administered medications for this visit.     Musculoskeletal: Strength & Muscle Tone:  UTA Gait & Station:  Seated Patient leans: N/A  Psychiatric Specialty Exam: Review of Systems  Psychiatric/Behavioral:  Positive for sleep disturbance. The patient is nervous/anxious.   All other systems reviewed and are negative.  There were no vitals taken for this visit.There is no height or weight on file to calculate BMI.  General Appearance: Casual  Eye Contact:  Fair  Speech:  Clear and Coherent  Volume:  Normal  Mood:  Anxious  Affect:  Congruent   Thought Process:  Goal Directed and Descriptions of Associations: Intact  Orientation:  Full (Time, Place, and Person)  Thought Content: Logical   Suicidal Thoughts:  No  Homicidal Thoughts:  No  Memory:  Immediate;   Fair Recent;   Fair Remote;   Fair  Judgement:  Fair  Insight:  Fair  Psychomotor Activity:  Normal  Concentration:  Concentration: Fair and Attention Span: Fair  Recall:  [employer] of Knowledge: Fair  Language: Fair  Akathisia:  No  Handed:  Right  AIMS (if indicated): done  Assets:  Communication Skills Desire for [place] Talents/Skills Transportation Vocational/Educational  ADL's:  Intact  Cognition: WNL  Sleep:   restless   Screenings: GAD-7    Flowsheet Row Video Visit from 06/19/2021 in [facility] Video Visit from 04/03/2021 in [facility] Video Visit from 03/04/2021 in [facility]  Total GAD-7 Score 3 6 17       PHQ2-9    Flowsheet Row Video Visit from 06/19/2021 in [facility] Procedure visit from 06/17/2021 in [facility] Video Visit from 06/05/2021 in [facility] Video Visit from 04/03/2021 in [facility] Video Visit from 03/04/2021 in [facility]  PHQ-2 Total Score 1 2 6  0 6  PHQ-9 Total Score 2 5 13  -- 20      Flowsheet Row Video Visit from 05/25/2021 in [facility] from 05/05/2021 in [facility] Video Visit from 03/21/2021 in [facility] No Risk Error: Question 6 not populated No Risk        Assessment and Plan: Colleen [name] is a 60 year old Caucasian female, unemployed, married, has a history of anxiety, PTSD, chronic pain, multiple medical  problems was evaluated by telemedicine today.  Patient is currently stable.  Plan MDD in remission Cymbalta 120 mg p.o. twice daily Seroquel 12.5-25 mg p.o. nightly. Continue CBT with Ms. Shary Decamp.  GAD-stable Cymbalta as prescribed Gabapentin 300 mg p.o. twice daily Seroquel 12.5-25 mg p.o. nightly Continue CBT  PTSD-stable Continue CBT  Insomnia likely due to pain-improving Seroquel  12.5-25 mg p.o. nightly  Tobacco use disorder-improving Provided counseling for 2 minutes.  Patient will continue to benefit from sufficient pain management.  Follows up with Dr. Holley Raring.  This note was generated in part or whole with voice recognition software. Voice recognition is usually quite accurate but there are transcription errors that can and very often do occur. I apologize for any typographical errors that were not detected and corrected.       Ursula Alert, MD 07/30/2021, 5:26 PM

## 2021-07-30 ENCOUNTER — Encounter

## 2021-08-01 ENCOUNTER — Encounter

## 2021-08-06 ENCOUNTER — Encounter

## 2021-08-08 ENCOUNTER — Encounter

## 2021-08-20 ENCOUNTER — Other Ambulatory Visit: Payer: Self-pay

## 2021-08-20 ENCOUNTER — Ambulatory Visit
Attending: Student in an Organized Health Care Education/Training Program | Admitting: Student in an Organized Health Care Education/Training Program

## 2021-08-20 ENCOUNTER — Encounter: Payer: Self-pay | Admitting: Student in an Organized Health Care Education/Training Program

## 2021-08-20 VITALS — BP 141/101 | HR 104 | Temp 97.3°F | Resp 16 | Ht 60.0 in | Wt 135.0 lb

## 2021-08-20 DIAGNOSIS — G894 Chronic pain syndrome: Secondary | ICD-10-CM | POA: Diagnosis not present

## 2021-08-20 DIAGNOSIS — M5126 Other intervertebral disc displacement, lumbar region: Secondary | ICD-10-CM | POA: Diagnosis not present

## 2021-08-20 DIAGNOSIS — M5416 Radiculopathy, lumbar region: Secondary | ICD-10-CM | POA: Diagnosis not present

## 2021-08-20 NOTE — Progress Notes (Signed)
PROVIDER NOTE: Information contained herein reflects review and annotations entered in association with encounter. Interpretation of such information and data should be left to medically-trained personnel. Information provided to patient can be located elsewhere in the medical record under "Patient Instructions". Document created using STT-dictation technology, any transcriptional errors that may result from process are unintentional.    Patient: Colleen [name]  Service Category: E/M  Provider: Gillis Santa, MD  DOB: 1961-04-27  DOS: 08/20/2021  Specialty: Interventional Pain Management  MRN: [id number]  Setting: Ambulatory outpatient  PCP: Remi Haggard, FNP  Type: Established Patient    Referring Provider: Remi Haggard, FNP  Location: Office  Delivery: Face-to-face     HPI  Colleen [name], a 60 y.o. year old female, is here today because of her Lumbar radiculopathy [M54.16]. Ms. Hjort primary complain today is Back Pain (lower) Last encounter: My last encounter with her was on 06/17/2021. Pertinent problems: Ms. 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 Pain Assessment: Severity of Chronic pain is reported as a 7 /10. Location: Back Lower/pain radiaites down ther hips at times. Onset: More than a month ago. Quality: Sharp, Stabbing, Aching, Constant. Timing: Constant. Modifying factor(s): walking and meds. Vitals:  height is 5' (1.524 m) and weight is 135 lb (61.2 kg). Her temperature is 97.3 F (36.3 C) (abnormal). Her blood pressure is 141/101 (abnormal) and her pulse is 104 (abnormal). Her respiration is 16 and oxygen saturation is 91%.   Reason for encounter: post-procedure assessment.    Post-Procedure Evaluation  Procedure (06/17/2021):   Type: Therapeutic Inter-Laminar Epidural Steroid  Injection  #2 in 2022 Region: Lumbar Level: L4-5 Level. Laterality: [name]        Anxiolysis: Please see nurses note.  Effectiveness during initial hour after procedure (Ultra-Short Term Relief): 75 %   Local anesthetic used: Long-acting (4-6 hours) Effectiveness: Defined as any analgesic benefit obtained secondary to the administration of local anesthetics. This carries significant diagnostic value as to the etiological location, or anatomical origin, of the pain. Duration of benefit is expected to coincide with the duration of the local anesthetic used.  Effectiveness during initial 4-6 hours after procedure (Short-Term Relief): 80 %   Long-term benefit: Defined as any relief past the pharmacologic duration of the local anesthetics.  Effectiveness past the initial 6 hours after procedure (Long-Term Relief): 65 %   Benefits, current: Defined as benefit present at the time of this evaluation.   Analgesia:  65% for first 6 weeks now with gradual return, patient would like to repeat Function: Ms. Stephani reports improvement in function ROM: Back to baseline    ROS  Constitutional: Denies any fever or chills Gastrointestinal: No reported hemesis, hematochezia, vomiting, or acute GI distress Musculoskeletal: Denies any acute onset joint swelling, redness, loss of ROM, or weakness Neurological: No reported episodes of acute onset apraxia, aphasia, dysarthria, agnosia, amnesia, paralysis, loss of coordination, or loss of consciousness  Medication Review  Cholecalciferol, DULoxetine, EPINEPHrine, Fluticasone-Umeclidin-Vilant, QUEtiapine, albuterol, cetirizine-pseudoephedrine, denosumab, gabapentin, ipratropium, and montelukast  History Review  Allergy: Colleen [name] is allergic to desvenlafaxine, morphine and related, penicillins, prazosin, tamoxifen, trazodone, clonazepam, codeine, hydroxyzine, lorazepam, paroxetine hcl, sulfa antibiotics, and cephalexin. Drug: Colleen [name]  reports no  history of drug use. Alcohol:  reports current alcohol use. Tobacco:  reports that she has been smoking cigarettes. She has been smoking an average of .  5 packs per day. She has never used smokeless tobacco. Social: Colleen [name]  reports that she has been smoking cigarettes. She has been smoking an average of .5 packs per day. She has never used smokeless tobacco. She reports current alcohol use. She reports that she does not use drugs. Medical:  has a past medical history of Anxiety, Asthma, Breast cancer ([place]) (2004), Chronic back pain, COPD (chronic obstructive pulmonary disease) ([place]), Depression, and Personal history of radiation therapy. Surgical: Colleen [name]  has a past surgical history that includes Nephrectomy (Left); Mastectomy (Left); Tubal ligation; Mastectomy; Breast biopsy (Left, 2004); Breast biopsy ([name]); and Breast lumpectomy (Left, 2004). Family: family history includes Alcohol abuse in her maternal grandfather; Bipolar disorder in her daughter; Breast cancer in her cousin and cousin; Drug abuse in her daughter.  Laboratory Chemistry Profile   Renal Lab Results  Component Value Date   BUN 7 05/05/2021   CREATININE 0.56 05/05/2021   GFRAA >60 03/21/2020   GFRNONAA >60 05/05/2021    Hepatic Lab Results  Component Value Date   AST 21 03/05/2021   ALT 13 03/05/2021   ALBUMIN 4.3 03/05/2021   ALKPHOS 39 03/05/2021   LIPASE 24 10/16/2020    Electrolytes Lab Results  Component Value Date   NA 132 (L) 05/05/2021   K 3.7 05/05/2021   CL 98 05/05/2021   CALCIUM 9.2 05/05/2021    Bone No results found for: VD25OH, VD125OH2TOT, [id number], [id number], 25OHVITD1, 25OHVITD2, 25OHVITD3, TESTOFREE, TESTOSTERONE  Inflammation (CRP: Acute Phase) (ESR: Chronic Phase) Lab Results  Component Value Date   LATICACIDVEN 0.78 10/11/2018         Note: Above Lab results reviewed.   Physical Exam  General appearance: Well nourished, well developed, and well hydrated. In no apparent  acute distress Mental status: Alert, oriented x 3 (person, place, & time)       Respiratory: No evidence of acute respiratory distress Eyes: PERLA Vitals: BP (!) 141/101   Pulse (!) 104   Temp (!) 97.3 F (36.3 C)   Resp 16   Ht 5' (1.524 m)   Wt 135 lb (61.2 kg)   SpO2 91% Comment: 2 liters at night at times  BMI 26.37 kg/m  BMI: Estimated body mass index is 26.37 kg/m as calculated from the following:   Height as of this encounter: 5' (1.524 m).   Weight as of this encounter: 135 lb (61.2 kg). Ideal: Ideal body weight: 45.5 kg (100 lb 4.9 oz) Adjusted ideal body weight: 51.8 kg (114 lb 3 oz)  Lumbar Spine Area Exam  Skin & Axial Inspection: No masses, redness, or swelling Alignment: Symmetrical Functional ROM: Pain restricted ROM affecting primarily the [name] Stability: No instability detected Muscle Tone/Strength: Functionally intact. No obvious neuro-muscular anomalies detected. Sensory (Neurological): Dermatomal pain pattern on the [name]   Gait & Posture Assessment  Ambulation: Limited Gait: Antalgic Posture: WNL  Lower Extremity Exam      Side: [name] lower extremity   Side: Left lower extremity  Stability: No instability observed           Stability: No instability observed          Skin & Extremity Inspection: Skin color, temperature, and hair growth are WNL. No peripheral edema or cyanosis. No masses, redness, swelling, asymmetry, or associated skin lesions. No contractures.   Skin & Extremity Inspection: Skin color, temperature, and hair growth are WNL. No peripheral edema or cyanosis. No masses, redness, swelling, asymmetry, or associated skin lesions. No contractures.  Functional ROM: Pain restricted ROM for hip and knee joints Limited SLR (straight leg raise)   Functional ROM: Unrestricted ROM                  Muscle Tone/Strength: Functionally intact. No obvious neuro-muscular anomalies detected.   Muscle Tone/Strength: Functionally intact. No obvious  neuro-muscular anomalies detected.  Sensory (Neurological): Neurogenic pain pattern         Sensory (Neurological): Unimpaired        DTR: Patellar: deferred today Achilles: deferred today Plantar: deferred today   DTR: Patellar: deferred today Achilles: deferred today Plantar: deferred today  Palpation: No palpable anomalies   Palpation: No palpable anomalies     Assessment   Status Diagnosis  Having a Flare-up Having a Flare-up Persistent 1. Lumbar radiculopathy   2. 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, L5/S1)   3. Chronic pain syndrome       Plan of Care   1. Lumbar radiculopathy - Lumbar Epidural Injection; Future  2. 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, L5/S1) - Lumbar Epidural Injection; Future  3. Chronic pain syndrome - Lumbar Epidural Injection; Future   Orders:  Orders Placed This Encounter  Procedures   Lumbar Epidural Injection    Standing Status:   Future    Standing Expiration Date:   09/20/2021    Scheduling Instructions:     Procedure: Interlaminar Lumbar Epidural Steroid injection (LESI)   [name] L4/5         Laterality: Midline     Sedation: without     Timeframe: ASAA    Order Specific Question:   Where will this procedure be performed?    Answer:   [facility]   Follow-up plan:   Return in about 6 days (around 08/26/2021) for [name] L4/5 ESI , without sedation.     Status post [name] L4-L5 ESI #1 on 06/11/2020, #2 10/22/20, bilateral C3, C4, C5 cervical facet medial branch nerve block 07/25/2020 with thoracic TPI: only helped for 24 hrs         Recent Visits Date Type Provider Dept  06/17/21 Procedure visit Gillis Santa, MD [facility]  Showing recent visits within past 90 days and meeting all other requirements Today's Visits Date Type Provider Dept  08/20/21 Office Visit Gillis Santa, MD [facility]  Showing today's visits and meeting all other requirements Future Appointments Date Type Provider Dept  08/26/21  Appointment Gillis Santa, MD [facility]  Showing future appointments within next 90 days and meeting all other requirements I discussed the assessment and treatment plan with the patient. The patient was provided an opportunity to ask questions and all were answered. The patient agreed with the plan and demonstrated an understanding of the instructions.  Patient advised to call back or seek an in-person evaluation if the symptoms or condition worsens.  Duration of encounter: 58mnutes.  Note by: BGillis Santa MD Date: 08/20/2021; Time: 11:46 AM

## 2021-08-20 NOTE — Progress Notes (Signed)
Safety precautions to be maintained throughout the outpatient stay will include: orient to surroundings, keep bed in low position, maintain call bell within reach at all times, provide assistance with transfer out of bed and ambulation.  

## 2021-08-26 ENCOUNTER — Ambulatory Visit
Admission: RE | Admit: 2021-08-26 | Discharge: 2021-08-26 | Disposition: A | Discharge status: Home / Self Care | Source: Ambulatory Visit | Attending: Student in an Organized Health Care Education/Training Program | Admitting: Student in an Organized Health Care Education/Training Program

## 2021-08-26 ENCOUNTER — Other Ambulatory Visit: Payer: Self-pay

## 2021-08-26 ENCOUNTER — Encounter: Payer: Self-pay | Admitting: Student in an Organized Health Care Education/Training Program

## 2021-08-26 ENCOUNTER — Ambulatory Visit (HOSPITAL_BASED_OUTPATIENT_CLINIC_OR_DEPARTMENT_OTHER): Admitting: Student in an Organized Health Care Education/Training Program

## 2021-08-26 VITALS — BP 149/98 | HR 92 | Temp 97.1°F | Resp 18 | Ht 60.0 in | Wt 135.0 lb

## 2021-08-26 DIAGNOSIS — M5416 Radiculopathy, lumbar region: Secondary | ICD-10-CM | POA: Insufficient documentation

## 2021-08-26 DIAGNOSIS — G894 Chronic pain syndrome: Secondary | ICD-10-CM | POA: Diagnosis not present

## 2021-08-26 DIAGNOSIS — M5126 Other intervertebral disc displacement, lumbar region: Secondary | ICD-10-CM

## 2021-08-26 DIAGNOSIS — M5412 Radiculopathy, cervical region: Secondary | ICD-10-CM

## 2021-08-26 MED ORDER — ROPIVACAINE HCL 2 MG/ML IJ SOLN
2.0000 mL | Freq: Once | INTRAMUSCULAR | Status: AC
  Administered 2021-08-26: 2 mL via EPIDURAL

## 2021-08-26 MED ORDER — SODIUM CHLORIDE (PF) 0.9 % IJ SOLN
INTRAMUSCULAR | Status: AC
  Filled 2021-08-26: qty 10

## 2021-08-26 MED ORDER — DEXAMETHASONE SODIUM PHOSPHATE 10 MG/ML IJ SOLN
10.0000 mg | Freq: Once | INTRAMUSCULAR | Status: AC
  Administered 2021-08-26: 10 mg

## 2021-08-26 MED ORDER — SODIUM CHLORIDE 0.9% FLUSH
2.0000 mL | Freq: Once | INTRAVENOUS | Status: AC
  Administered 2021-08-26: 2 mL

## 2021-08-26 MED ORDER — LIDOCAINE HCL 2 % IJ SOLN
20.0000 mL | Freq: Once | INTRAMUSCULAR | Status: AC
  Administered 2021-08-26: 200 mg

## 2021-08-26 MED ORDER — DEXAMETHASONE SODIUM PHOSPHATE 10 MG/ML IJ SOLN
INTRAMUSCULAR | Status: AC
  Filled 2021-08-26: qty 1

## 2021-08-26 MED ORDER — LIDOCAINE HCL 2 % IJ SOLN
INTRAMUSCULAR | Status: AC
  Filled 2021-08-26: qty 10

## 2021-08-26 MED ORDER — IOHEXOL 180 MG/ML  SOLN
10.0000 mL | Freq: Once | INTRAMUSCULAR | Status: AC
  Administered 2021-08-26: 5 mL via EPIDURAL

## 2021-08-26 MED ORDER — ROPIVACAINE HCL 2 MG/ML IJ SOLN
INTRAMUSCULAR | Status: AC
  Filled 2021-08-26: qty 20

## 2021-08-26 NOTE — Progress Notes (Signed)
Safety precautions to be maintained throughout the outpatient stay will include: orient to surroundings, keep bed in low position, maintain call bell within reach at all times, provide assistance with transfer out of bed and ambulation.  

## 2021-08-26 NOTE — Progress Notes (Signed)
PROVIDER NOTE: Information contained herein reflects review and annotations entered in association with encounter. Interpretation of such information and data should be left to medically-trained personnel. Information provided to patient can be located elsewhere in the medical record under "Patient Instructions". Document created using STT-dictation technology, any transcriptional errors that may result from process are unintentional.    Patient: Colleen [name]  Service Category: Procedure  Provider: Gillis Santa, MD  DOB: [birth year]  DOS: 08/26/2021  Location: [facility]  MRN: [id number]  Setting: Ambulatory - outpatient  Referring Provider: Remi Haggard, FNP  Type: Established Patient  Specialty: Interventional Pain Management  PCP: Remi Haggard, FNP   Primary Reason for Visit: Interventional Pain Management Treatment. CC: Back Pain (Lower, [name] side worse)  Procedure:          Anesthesia, Analgesia, Anxiolysis:  Type: Therapeutic Inter-Laminar Epidural Steroid Injection  #3 in 2022 Region: Lumbar Level: L4-5 Level. Laterality: [name]         Type: Local Anesthesia   Local Anesthetic: Lidocaine 1-2%  Position: Prone with head of the table was raised to facilitate breathing.   Indications: 1. Lumbar radiculopathy   2. 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, L5/S1)   3. Cervical radicular pain   4. Chronic pain syndrome    Pain Score: Pre-procedure: 8 /10 Post-procedure: 6 /10   Pre-op Assessment:  Colleen [name] is a 60 y.o. (year old), female patient, seen today for interventional treatment. She  has a past surgical history that includes Nephrectomy (Left); Mastectomy (Left); Tubal ligation; Mastectomy; Breast biopsy (Left, 2004); Breast biopsy ([name]); and Breast lumpectomy (Left, 2004). Colleen [name] has a current medication list which includes the following prescription(s): cetirizine-pseudoephedrine, cholecalciferol, denosumab, duloxetine, epinephrine, trelegy  ellipta, gabapentin, ipratropium, montelukast, quetiapine, and ventolin hfa. Her primarily concern today is the Back Pain (Lower, [name] side worse)  Initial Vital Signs:  Pulse/HCG Rate: 98  Temp: (!) 97.1 F (36.2 C) Resp: 18 BP: (!) 127/94 SpO2: 90 %  BMI: Estimated body mass index is 26.37 kg/m as calculated from the following:   Height as of this encounter: 5' (1.524 m).   Weight as of this encounter: 135 lb (61.2 kg).  Risk Assessment: Allergies: Reviewed. She is allergic to desvenlafaxine, morphine and related, penicillins, prazosin, tamoxifen, trazodone, clonazepam, codeine, hydroxyzine, lorazepam, paroxetine hcl, sulfa antibiotics, and cephalexin.  Allergy Precautions: None required Coagulopathies: Reviewed. None identified.  Blood-thinner therapy: None at this time Active Infection(s): Reviewed. None identified. Colleen [name] is afebrile  Site Confirmation: Colleen [name] was asked to confirm the procedure and laterality before marking the site Procedure checklist: Completed Consent: Before the procedure and under the influence of no sedative(s), amnesic(s), or anxiolytics, the patient was informed of the treatment options, risks and possible complications. To fulfill our ethical and legal obligations, as recommended by the American Medical Association's Code of Ethics, I have informed the patient of my clinical impression; the nature and purpose of the treatment or procedure; the risks, benefits, and possible complications of the intervention; the alternatives, including doing nothing; the risk(s) and benefit(s) of the alternative treatment(s) or procedure(s); and the risk(s) and benefit(s) of doing nothing. The patient was provided information about the general risks and possible complications associated with the procedure. These may include, but are not limited to: failure to achieve desired goals, infection, bleeding, organ or nerve damage, allergic reactions, paralysis, and death. In  addition, the patient was informed of those risks and complications associated to Spine-related procedures, such as failure to decrease  pain; infection (i.e.: Meningitis, epidural or intraspinal abscess); bleeding (i.e.: epidural hematoma, subarachnoid hemorrhage, or any other type of intraspinal or peri-dural bleeding); organ or nerve damage (i.e.: Any type of peripheral nerve, nerve root, or spinal cord injury) with subsequent damage to sensory, motor, and/or autonomic systems, resulting in permanent pain, numbness, and/or weakness of one or several areas of the body; allergic reactions; (i.e.: anaphylactic reaction); and/or death. Furthermore, the patient was informed of those risks and complications associated with the medications. These include, but are not limited to: allergic reactions (i.e.: anaphylactic or anaphylactoid reaction(s)); adrenal axis suppression; blood sugar elevation that in diabetics may result in ketoacidosis or comma; water retention that in patients with history of congestive heart failure may result in shortness of breath, pulmonary edema, and decompensation with resultant heart failure; weight gain; swelling or edema; medication-induced neural toxicity; particulate matter embolism and blood vessel occlusion with resultant organ, and/or nervous system infarction; and/or aseptic necrosis of one or more joints. Finally, the patient was informed that Medicine is not an exact science; therefore, there is also the possibility of unforeseen or unpredictable risks and/or possible complications that may result in a catastrophic outcome. The patient indicated having understood very clearly. We have given the patient no guarantees and we have made no promises. Enough time was given to the patient to ask questions, all of which were answered to the patient's satisfaction. Colleen [name] has indicated that she wanted to continue with the procedure. Attestation: I, the ordering provider, attest that I  have discussed with the patient the benefits, risks, side-effects, alternatives, likelihood of achieving goals, and potential problems during recovery for the procedure that I have provided informed consent. Date  Time: 08/26/2021  8:32 AM  Pre-Procedure Preparation:  Monitoring: As per clinic protocol. Respiration, ETCO2, SpO2, BP, heart rate and rhythm monitor placed and checked for adequate function Safety Precautions: Patient was assessed for positional comfort and pressure points before starting the procedure. Time-out: I initiated and conducted the "Time-out" before starting the procedure, as per protocol. The patient was asked to participate by confirming the accuracy of the "Time Out" information. Verification of the correct person, site, and procedure were performed and confirmed by me, the nursing staff, and the patient. "Time-out" conducted as per Joint Commission's Universal Protocol (UP.01.01.01). Time: 0902  Description of Procedure:          Target Area: The interlaminar space, initially targeting the lower laminar border of the superior vertebral body. Approach: Paramedial approach. Area Prepped: Entire Posterior Lumbar Region DuraPrep (Iodine Povacrylex [0.7% available iodine] and Isopropyl Alcohol, 74% w/w) Safety Precautions: Aspiration looking for blood return was conducted prior to all injections. At no point did we inject any substances, as a needle was being advanced. No attempts were made at seeking any paresthesias. Safe injection practices and needle disposal techniques used. Medications properly checked for expiration dates. SDV (single dose vial) medications used. Description of the Procedure: Protocol guidelines were followed. The procedure needle was introduced through the skin, ipsilateral to the reported pain, and advanced to the target area. Bone was contacted and the needle walked caudad, until the lamina was cleared. The epidural space was identified using  "loss-of-resistance technique" with 2-3 ml of PF-NaCl (0.9% NSS), in a 5cc LOR glass syringe.  Vitals:   08/26/21 0837 08/26/21 0855 08/26/21 0905 08/26/21 0909  BP: (!) 127/94 (!) 133/92 (!) 141/97 (!) 149/98  Pulse: 98 98 93 92  Resp: 18 17 20 18   Temp: (!) 97.1 F (  36.2 C)     TempSrc: Temporal     SpO2: 90% 93% 96% 97%  Weight: 135 lb (61.2 kg)     Height: 5' (1.524 m)       Start Time: 0902 hrs. End Time: 0907 hrs.  Materials:  Needle(s) Type: Epidural needle Gauge: 22G Length: 3.5-in Medication(s): Please see orders for medications and dosing details. 6 cc solution made of 3 cc of preservative-free saline, 2 cc of 0.2% ropivacaine, 1 cc of Decadron 10 mg/cc.  Imaging Guidance (Spinal):          Type of Imaging Technique: Fluoroscopy Guidance (Spinal) Indication(s): Assistance in needle guidance and placement for procedures requiring needle placement in or near specific anatomical locations not easily accessible without such assistance. Exposure Time: Please see nurses notes. Contrast: Before injecting any contrast, we confirmed that the patient did not have an allergy to iodine, shellfish, or radiological contrast. Once satisfactory needle placement was completed at the desired level, radiological contrast was injected. Contrast injected under live fluoroscopy. No contrast complications. See chart for type and volume of contrast used. Fluoroscopic Guidance: I was personally present during the use of fluoroscopy. "Tunnel Vision Technique" used to obtain the best possible view of the target area. Parallax error corrected before commencing the procedure. "Direction-depth-direction" technique used to introduce the needle under continuous pulsed fluoroscopy. Once target was reached, antero-posterior, oblique, and lateral fluoroscopic projection used confirm needle placement in all planes. Images permanently stored in EMR. Interpretation: I personally interpreted the imaging  intraoperatively. Adequate needle placement confirmed in multiple planes. Appropriate spread of contrast into desired area was observed. No evidence of afferent or efferent intravascular uptake. No intrathecal or subarachnoid spread observed. Permanent images saved into the patient's record.  Post-operative Assessment:  Post-procedure Vital Signs:  Pulse/HCG Rate: 92 (nsr)  Temp: (!) 97.1 F (36.2 C) Resp: 18 BP: (!) 149/98 SpO2: 97 %  EBL: None  Complications: No immediate post-treatment complications observed by team, or reported by patient.  Note: The patient tolerated the entire procedure well. A repeat set of vitals were taken after the procedure and the patient was kept under observation following institutional policy, for this type of procedure. Post-procedural neurological assessment was performed, showing return to baseline, prior to discharge. The patient was provided with post-procedure discharge instructions, including a section on how to identify potential problems. Should any problems arise concerning this procedure, the patient was given instructions to immediately contact us, at any time, without hesitation. In any case, we plan to contact the patient by telephone for a follow-up status report regarding this interventional procedure.  Comments:  No additional relevant information.  Plan of Care  Orders:    Patient is also experiencing bilateral upper extremity paresthesias as well as weakness.  She has had a prior cervical MRI which showed C3-C4 spondylosis however no neuroforaminal stenosis.  She has had a nerve conduction velocity and EMG study done as well which was negative.  Given worsening paresthesias and weakness, recommend repeat cervical MRI without contrast.  She is doing physical therapy for her cervical spine and upper extremity symptoms at the moment.  Orders Placed This Encounter  Procedures   DG PAIN CLINIC C-ARM 1-60 MIN NO REPORT    Intraoperative  interpretation by procedural physician at [facility].    Standing Status:   Standing    Number of Occurrences:   1    Order Specific Question:   Reason for exam:    Answer:   Assistance  in needle guidance and placement for procedures requiring needle placement in or near specific anatomical locations not easily accessible without such assistance.   MR CERVICAL SPINE WO CONTRAST    Standing Status:   Future    Standing Expiration Date:   08/26/2022    Order Specific Question:   What is the patient's sedation requirement?    Answer:   No Sedation    Order Specific Question:   Does the patient have a pacemaker or implanted devices?    Answer:   No    Order Specific Question:   Preferred imaging location?    Answer:   [facility] (table limit - 550lbs)   Medications ordered for procedure: Meds ordered this encounter  Medications   iohexol (OMNIPAQUE) 180 MG/ML injection 10 mL    Must be Myelogram-compatible. If not available, you may substitute with a water-soluble, non-ionic, hypoallergenic, myelogram-compatible radiological contrast medium.   lidocaine (XYLOCAINE) 2 % (with pres) injection 400 mg   sodium chloride flush (NS) 0.9 % injection 2 mL   ropivacaine (PF) 2 mg/mL (0.2%) (NAROPIN) injection 2 mL   dexamethasone (DECADRON) injection 10 mg   Medications administered: We administered iohexol, lidocaine, sodium chloride flush, ropivacaine (PF) 2 mg/mL (0.2%), and dexamethasone.  See the medical record for exact dosing, route, and time of administration.  Follow-up plan:   Return for Keep sch. appt.    Recent Visits Date Type Provider Dept  08/20/21 Office Visit Gillis Santa, MD [facility]  06/17/21 Procedure visit Gillis Santa, MD [facility]  Showing recent visits within past 90 days and meeting all other requirements Today's Visits Date Type Provider Dept  08/26/21 Procedure visit Gillis Santa, MD [facility]  Showing  today's visits and meeting all other requirements Future Appointments Date Type Provider Dept  09/23/21 Appointment Gillis Santa, MD [facility]  Showing future appointments within next 90 days and meeting all other requirements Disposition: Discharge home  Discharge (Date  Time): 08/26/2021; 0913 hrs.   Primary Care Physician: Remi Haggard, FNP Location: [facility] Note by: Gillis Santa, MD Date: 08/26/2021; Time: 9:35 AM  Disclaimer:  Medicine is not an exact science. The only guarantee in medicine is that nothing is guaranteed. It is important to note that the decision to proceed with this intervention was based on the information collected from the patient. The Data and conclusions were drawn from the patient's questionnaire, the interview, and the physical examination. Because the information was provided in large part by the patient, it cannot be guaranteed that it has not been purposely or unconsciously manipulated. Every effort has been made to obtain as much relevant data as possible for this evaluation. It is important to note that the conclusions that lead to this procedure are derived in large part from the available data. Always take into account that the treatment will also be dependent on availability of resources and existing treatment guidelines, considered by other Pain Management Practitioners as being common knowledge and practice, at the time of the intervention. For Medico-Legal purposes, it is also important to point out that variation in procedural techniques and pharmacological choices are the acceptable norm. The indications, contraindications, technique, and results of the above procedure should only be interpreted and judged by a Board-Certified Interventional Pain Specialist with extensive familiarity and expertise in the same exact procedure and technique.

## 2021-08-26 NOTE — Patient Instructions (Signed)

## 2021-08-27 ENCOUNTER — Telehealth: Payer: Self-pay | Admitting: *Deleted

## 2021-08-27 NOTE — Telephone Encounter (Signed)
Spoke with patient after LESI, right sided.  States she is having some pain in the right side of the body and is currently laying down with a heating pad.  I did tell her that after numbing agent has worn off that the pain will return and it can feel different d/t the introduction of a volume of medication that was delivered.  I told her that the heating pad may help.  She could also use Tylenol 325 mg or 500 mg as directed if tolerated.  Told her that she would need to give the steroids a period of time to work.  Patient verbalizes u/o information.

## 2021-08-27 NOTE — Telephone Encounter (Signed)
Called for post procedure check. No answer. LVM for her to return call if she is having any issues.

## 2021-09-19 ENCOUNTER — Encounter: Payer: Self-pay | Admitting: Student in an Organized Health Care Education/Training Program

## 2021-09-23 ENCOUNTER — Encounter: Payer: Self-pay | Admitting: Psychiatry

## 2021-09-23 ENCOUNTER — Ambulatory Visit
Attending: Student in an Organized Health Care Education/Training Program | Admitting: Student in an Organized Health Care Education/Training Program

## 2021-09-23 ENCOUNTER — Encounter: Payer: Self-pay | Admitting: Student in an Organized Health Care Education/Training Program

## 2021-09-23 ENCOUNTER — Other Ambulatory Visit: Payer: Self-pay

## 2021-09-23 ENCOUNTER — Telehealth (INDEPENDENT_AMBULATORY_CARE_PROVIDER_SITE_OTHER): Admitting: Psychiatry

## 2021-09-23 DIAGNOSIS — G4701 Insomnia due to medical condition: Secondary | ICD-10-CM

## 2021-09-23 DIAGNOSIS — M5126 Other intervertebral disc displacement, lumbar region: Secondary | ICD-10-CM

## 2021-09-23 DIAGNOSIS — F3342 Major depressive disorder, recurrent, in full remission: Secondary | ICD-10-CM

## 2021-09-23 DIAGNOSIS — M47812 Spondylosis without myelopathy or radiculopathy, cervical region: Secondary | ICD-10-CM

## 2021-09-23 DIAGNOSIS — F411 Generalized anxiety disorder: Secondary | ICD-10-CM | POA: Diagnosis not present

## 2021-09-23 DIAGNOSIS — G894 Chronic pain syndrome: Secondary | ICD-10-CM | POA: Diagnosis not present

## 2021-09-23 DIAGNOSIS — F4310 Post-traumatic stress disorder, unspecified: Secondary | ICD-10-CM | POA: Diagnosis not present

## 2021-09-23 DIAGNOSIS — M5416 Radiculopathy, lumbar region: Secondary | ICD-10-CM | POA: Diagnosis not present

## 2021-09-23 DIAGNOSIS — F17200 Nicotine dependence, unspecified, uncomplicated: Secondary | ICD-10-CM

## 2021-09-23 MED ORDER — GABAPENTIN 300 MG PO CAPS: 300.0000 mg | ORAL_CAPSULE | ORAL | 0 refills | Status: DC

## 2021-09-23 NOTE — Progress Notes (Signed)
Patient: Colleen [name]  Service Category: E/M  Provider: Gillis Santa, MD  DOB: 1961/05/31  DOS: 09/23/2021  Location: Office  MRN: [id number]  Setting: Ambulatory outpatient  Referring Provider: Remi Haggard, FNP  Type: Established Patient  Specialty: Interventional Pain Management  PCP: Remi Haggard, FNP  Location: Home  Delivery: TeleHealth     Virtual Encounter - Pain Management PROVIDER NOTE: Information contained herein reflects review and annotations entered in association with encounter. Interpretation of such information and data should be left to medically-trained personnel. Information provided to patient can be located elsewhere in the medical record under "Patient Instructions". Document created using STT-dictation technology, any transcriptional errors that may result from process are unintentional.    Contact & Pharmacy Preferred: [phone] Home: [phone] (home) Mobile: [phone] (mobile) E-mail: [email or web address]  [place], [place] [address] Phone: [phone] Fax: [phone]   Pre-screening  [name] offered "in-person" vs "virtual" encounter. She indicated preferring virtual for this encounter.   Reason COVID-19*  Social distancing based on CDC and AMA recommendations.   I contacted Shauntavia Brackin on 09/23/2021 via telephone.      I clearly identified myself as Gillis Santa, MD. I verified that I was speaking with the correct person using two identifiers (Name: Colleen [name], and date of birth: 1961/06/28).  Consent I sought verbal advanced consent from Colleen [name] for virtual visit interactions. I informed Colleen [name] of possible security and privacy concerns, risks, and limitations associated with providing "not-in-person" medical evaluation and management services. I also informed [name] of the availability of "in-person" appointments. Finally, I informed  her that there would be a charge for the virtual visit and that she could be  personally, fully or partially, financially responsible for it. Colleen [name] expressed understanding and agreed to proceed.   Historic Elements   Colleen [name] is a 60 y.o. year old, female patient evaluated today after our last contact on 08/26/2021. Colleen [name]  has a past medical history of Anxiety, Asthma, Breast cancer ([place]) (2004), Chronic back pain, COPD (chronic obstructive pulmonary disease) ([place]), Depression, and Personal history of radiation therapy. She also  has a past surgical history that includes Nephrectomy (Left); Mastectomy (Left); Tubal ligation; Mastectomy; Breast biopsy (Left, 2004); Breast biopsy ([name]); and Breast lumpectomy (Left, 2004). Colleen [name] has a current medication list which includes the following prescription(s): cetirizine-pseudoephedrine, cholecalciferol, denosumab, duloxetine, epinephrine, trelegy ellipta, gabapentin, ipratropium, montelukast, quetiapine, and ventolin hfa. She  reports that she has been smoking cigarettes. She has been smoking an average of .5 packs per day. She has never used smokeless tobacco. She reports current alcohol use. She reports that she does not use drugs. [name] is allergic to desvenlafaxine, morphine and related, penicillins, prazosin, tamoxifen, trazodone, clonazepam, codeine, hydroxyzine, lorazepam, paroxetine hcl, sulfa antibiotics, and cephalexin.   HPI  Today, she is being contacted for a post-procedure assessment.   Post-Procedure Evaluation  Procedure (08/26/2021):   Type: Therapeutic Inter-Laminar Epidural Steroid Injection  #3 in 2022 Region: Lumbar Level: L4-5 Level. Laterality: [name]      Anxiolysis: Please see nurses note.  Effectiveness during initial hour after procedure (Ultra-Short Term Relief): 100 %   Local anesthetic used: Long-acting (4-6 hours) Effectiveness: Defined as any analgesic benefit obtained secondary to the  administration of local anesthetics. This carries significant diagnostic value as to the etiological location, or anatomical origin, of the pain. Duration of benefit is expected  to coincide with the duration of the local anesthetic used.  Effectiveness during initial 4-6 hours after procedure (Short-Term Relief): 80 %   Long-term benefit: Defined as any relief past the pharmacologic duration of the local anesthetics.  Effectiveness past the initial 6 hours after procedure (Long-Term Relief): 55 % (2 weeks)   Benefits, current: Defined as benefit present at the time of this evaluation.   Analgesia:  45-50% Function: Somewhat improved   Laboratory Chemistry Profile   Renal Lab Results  Component Value Date   BUN 7 05/05/2021   CREATININE 0.56 05/05/2021   GFRAA >60 03/21/2020   GFRNONAA >60 05/05/2021    Hepatic Lab Results  Component Value Date   AST 21 03/05/2021   ALT 13 03/05/2021   ALBUMIN 4.3 03/05/2021   ALKPHOS 39 03/05/2021   LIPASE 24 10/16/2020    Electrolytes Lab Results  Component Value Date   NA 132 (L) 05/05/2021   K 3.7 05/05/2021   CL 98 05/05/2021   CALCIUM 9.2 05/05/2021    Bone No results found for: VD25OH, VD125OH2TOT, HQ1975OI3, GP4982ME1, 25OHVITD1, 25OHVITD2, 25OHVITD3, TESTOFREE, TESTOSTERONE  Inflammation (CRP: Acute Phase) (ESR: Chronic Phase) Lab Results  Component Value Date   LATICACIDVEN 0.78 10/11/2018         Note: Above Lab results reviewed.  Assessment  The primary encounter diagnosis was Lumbar radiculopathy. Diagnoses of 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, L5/S1), Chronic pain syndrome, and Cervical facet joint syndrome (C3/4, C4/5) were also pertinent to this visit.  Plan of Care   1. Lumbar radiculopathy -Status post [name] L4-L5 ESI #3 on 08/26/2021.  This was helpful.  Hold off on any additional epidurals for the remainder of the year.  Continue to monitor symptoms.  Can consider repeating next year.  2. Lumbar disc herniation  (L4/5, L5/S1) -ESI as above  3. Cervical facet joint syndrome (C3/4, C4/5) -Patient is having increased neck pain as well as interscapular pain.  It is not radiating into her hands.  She has not heard about her cervical MRI scheduling.  I will reach out to [employer] to get an update regarding this. -For her neck pain, patient is status post bilateral C3, C4, C5 cervical facet medial branch nerve block 07/25/2020.  We discussed repeating this for her neck pain.  Risks and benefits reviewed and patient like to proceed. - CERVICAL FACET (MEDIAL BRANCH NERVE BLOCK) ; Future   Orders Placed This Encounter  Procedures   CERVICAL FACET (MEDIAL BRANCH NERVE BLOCK)     Standing Status:   Future    Standing Expiration Date:   10/23/2021    Scheduling Instructions:     Side: Bilateral #2     Level: C3-4, C4-5, C5-6 Facet joints (C3, C4, C5, Medial Branch Nerves)     Sedation: without     Timeframe: As soon as schedule allows    Order Specific Question:   Where will this procedure be performed?    Answer:   [facility]    Follow-up plan:   Return in about 3 weeks (around 10/14/2021) for B/L C3, 4, 5 Fcts, without sedation.    Recent Visits Date Type Provider Dept  08/26/21 Procedure visit Gillis Santa, MD [facility]  08/20/21 Office Visit Gillis Santa, MD [facility]  Showing recent visits within past 90 days and meeting all other requirements Today's Visits Date Type Provider Dept  09/23/21 Office Visit Gillis Santa, MD [facility]  Showing today's visits and meeting all other requirements  Future Appointments No visits were found meeting these conditions. Showing future appointments within next 90 days and meeting all other requirements I discussed the assessment and treatment plan with the patient. The patient was provided an opportunity to ask questions and all were answered. The patient agreed with the plan and demonstrated an understanding of the  instructions.  Patient advised to call back or seek an in-person evaluation if the symptoms or condition worsens.  Duration of encounter: 3mnutes.  Note by: BGillis Santa MD Date: 09/23/2021; Time: 10:29 AM

## 2021-09-23 NOTE — Progress Notes (Signed)
Virtual Visit via Video Note  I connected with Colleen [name] on 09/23/21 at 10:40 AM EST by a video enabled telemedicine application and verified that I am speaking with the correct person using two identifiers.  Location Provider Location : [facility] Patient Location : Home  Participants: Patient , Provider    I discussed the limitations of evaluation and management by telemedicine and the availability of in person appointments. The patient expressed understanding and agreed to proceed.   I discussed the assessment and treatment plan with the patient. The patient was provided an opportunity to ask questions and all were answered. The patient agreed with the plan and demonstrated an understanding of the instructions.   The patient was advised to call back or seek an in-person evaluation if the symptoms worsen or if the condition fails to improve as anticipated.   [place] MD OP Progress Note  09/23/2021 10:58 AM Tina Gruner  MRN:  [id number]  Chief Complaint:  Chief Complaint   Follow-up; Anxiety; Depression    HPI: Colleen [name] is a 60 year old Caucasian female, married, lives in [place], has a history of PTSD, MDD, insomnia, chronic back pain, COPD, radiation therapy per history, surgical history of nephrectomy, mastectomy, tubal ligation was evaluated by telemedicine today.  Patient today reports she is currently struggling with a lot of pain.  She reports she was able to have an evaluation by her pain provider today and she will have to go back for injections.  She looks forward to that.  Having exacerbation of her pain does have an impact on her mood and sleep.  She did not sleep last night.  When her pain is under control she sleeps better.  She reports she is anxious about her pain and it does have an impact on her mood symptoms in general.  She continues to follow-up with her therapist and reports therapy sessions as beneficial.  She denies suicidality.  She denies  homicidality or perceptual disturbances.  Patient is compliant on medications, denies side effects.  Patient denies any other concerns today.  Visit Diagnosis:    ICD-10-CM   1. MDD (major depressive disorder), recurrent, in full remission ([place])  F33.42 gabapentin (NEURONTIN) 300 MG capsule    2. GAD (generalized anxiety disorder)  F41.1 gabapentin (NEURONTIN) 300 MG capsule    3. PTSD (post-traumatic stress disorder)  F43.10     4. Insomnia due to medical condition  G47.01    Pain, anxiety, COPD    5. Tobacco use disorder  F17.200       Past Psychiatric History: I have reviewed past psychiatric history from progress note on 03/04/2021.  Past trials of Paxil, Klonopin, trazodone, prazosin, desvenlafaxine, lorazepam  Past Medical History:  Past Medical History:  Diagnosis Date   Anxiety    Asthma    Breast cancer ([place]) 2004   left breast   Chronic back pain    COPD (chronic obstructive pulmonary disease) ([place])    Depression    Personal history of radiation therapy     Past Surgical History:  Procedure Laterality Date   BREAST BIOPSY Left 2004   positive   BREAST BIOPSY Right    neg   BREAST LUMPECTOMY Left 2004   positive   MASTECTOMY Left    MASTECTOMY     NEPHRECTOMY Left    TUBAL LIGATION      Family Psychiatric History: Reviewed family psychiatric history from progress note on 03/04/2021  Family History:  Family History  Problem Relation Age of  Onset   Breast cancer Cousin    Breast cancer Cousin    Bipolar disorder Daughter    Drug abuse Daughter    Alcohol abuse Maternal Grandfather     Social History: Reviewed social history from progress note on 03/04/2021 Social History   Socioeconomic History   Marital status: Married    Spouse name: Not on file   Number of children: 2   Years of education: GED   Highest education level: Not on file  Occupational History   Not on file  Tobacco Use   Smoking status: Every Day    Packs/day: 0.50    Types:  Cigarettes   Smokeless tobacco: Never  Vaping Use   Vaping Use: Never used  Substance and Sexual Activity   Alcohol use: Yes   Drug use: Never   Sexual activity: Not Currently  Other Topics Concern   Not on file  Social History Narrative   Not on file   Social Determinants of Health   Financial Resource Strain: Not on file  Food Insecurity: Not on file  Transportation Needs: Not on file  Physical Activity: Not on file  Stress: Not on file  Social Connections: Not on file    Allergies:  Allergies  Allergen Reactions   Desvenlafaxine Anaphylaxis   Morphine And Related Anaphylaxis   Penicillins Anaphylaxis    Has patient had a PCN reaction causing immediate rash, facial/tongue/throat swelling, SOB or lightheadedness with hypotension: Yes Has patient had a PCN reaction causing severe rash involving mucus membranes or skin necrosis: No Has patient had a PCN reaction that required hospitalization: Unknown Has patient had a PCN reaction occurring within the last 10 years: No If all of the above answers are "NO", then may proceed with Cephalosporin use.    Prazosin Other (See Comments)   Tamoxifen Anaphylaxis   Trazodone Anaphylaxis   Clonazepam    Codeine Swelling   Hydroxyzine Itching   Lorazepam     Patient feels this medications makes her to sedated and wants to avoid   Paroxetine Hcl Hives   Sulfa Antibiotics Itching   Cephalexin Rash    Metabolic Disorder Labs: Lab Results  Component Value Date   HGBA1C 5.4 10/12/2018   MPG 108.28 10/12/2018   No results found for: PROLACTIN No results found for: CHOL, TRIG, HDL, CHOLHDL, VLDL, LDLCALC Lab Results  Component Value Date   TSH 0.616 03/05/2021    Therapeutic Level Labs: No results found for: LITHIUM No results found for: VALPROATE No components found for:  CBMZ  Current Medications: Current Outpatient Medications  Medication Sig Dispense Refill   cetirizine-pseudoephedrine (ZYRTEC-D) 5-120 MG tablet  Take 1 tablet by mouth 2 (two) times daily as needed for allergies.     Cholecalciferol 25 MCG (1000 UT) tablet Take by mouth.     denosumab (PROLIA) 60 MG/ML SOSY injection Inject 60 mg into the skin every 6 (six) months.     DULoxetine (CYMBALTA) 60 MG capsule Take 1 capsule (60 mg total) by mouth 2 (two) times daily. 180 capsule 0   EPINEPHrine 0.3 mg/0.3 mL IJ SOAJ injection Inject 0.3 mg into the muscle as needed.     Fluticasone-Umeclidin-Vilant (TRELEGY ELLIPTA) 100-62.5-25 MCG/INH AEPB Inhale 1 puff into the lungs daily.     gabapentin (NEURONTIN) 300 MG capsule Take 1-2 capsules (300-600 mg total) by mouth as directed. Take 1 capsule daily AM and 2 capsules daily at bedtime 270 capsule 0   ipratropium (ATROVENT) 0.02 % nebulizer solution  Take by nebulization 4 (four) times daily.     montelukast (SINGULAIR) 10 MG tablet Take 10 mg by mouth at bedtime.     predniSONE (STERAPRED UNI-PAK 21 TAB) 10 MG (21) TBPK tablet Take by mouth.     QUEtiapine (SEROQUEL) 25 MG tablet Take 0.5-1 tablets (12.5-25 mg total) by mouth at bedtime. 90 tablet 0   VENTOLIN HFA 108 (90 Base) MCG/ACT inhaler Inhale 1-2 puffs into the lungs every 4 (four) hours as needed for cough or wheezing.     No current facility-administered medications for this visit.     Musculoskeletal: Strength & Muscle Tone:  UTA Gait & Station:  Seated Patient leans: N/A  Psychiatric Specialty Exam: Review of Systems  Musculoskeletal:  Positive for back pain and neck pain.  Psychiatric/Behavioral:  Positive for sleep disturbance. The patient is nervous/anxious.   All other systems reviewed and are negative.  There were no vitals taken for this visit.There is no height or weight on file to calculate BMI.  General Appearance: Casual  Eye Contact:  Fair  Speech:  Clear and Coherent  Volume:  Normal  Mood:  Anxious  Affect:  Congruent  Thought Process:  Goal Directed and Descriptions of Associations: Intact  Orientation:  Full  (Time, Place, and Person)  Thought Content: Logical   Suicidal Thoughts:  No  Homicidal Thoughts:  No  Memory:  Immediate;   Fair Recent;   Fair Remote;   Fair  Judgement:  Fair  Insight:  Fair  Psychomotor Activity:  Normal  Concentration:  Concentration: Fair and Attention Span: Fair  Recall:  [employer] of Knowledge: Fair  Language: Fair  Akathisia:  No  Handed:  Right  AIMS (if indicated): done, 0  Assets:  Communication Skills Desire for Improvement Housing Social Support  ADL's:  Intact  Cognition: WNL  Sleep:  Poor due to pain   Screenings: GAD-7    Flowsheet Row Video Visit from 06/19/2021 in [facility] Video Visit from 04/03/2021 in [facility] Video Visit from 03/04/2021 in [facility]  Total GAD-7 Score 3 6 17       PHQ2-9    Flowsheet Row Video Visit from 06/19/2021 in [facility] Procedure visit from 06/17/2021 in [facility] Video Visit from 06/05/2021 in [facility] Video Visit from 04/03/2021 in [facility] Video Visit from 03/04/2021 in [facility]  PHQ-2 Total Score 1 2 6  0 6  PHQ-9 Total Score 2 5 13  -- 20      Flowsheet Row Video Visit from 05/25/2021 in [facility] from 05/05/2021 in [facility] Video Visit from 03/21/2021 in [facility] No Risk Error: Question 6 not populated No Risk        Assessment and Plan: Colleen [name] is a 61 year old Caucasian female, unemployed, married, has a history of anxiety, MDD, PTSD, chronic pain, multiple medical problems was evaluated by telemedicine today.  Patient is currently struggling with anxiety, sleep problems more so because of her pain  being uncontrolled.  She will benefit from pain management as well as the following medication changes.  Plan MDD in remission Cymbalta 120 mg p.o. twice daily Seroquel 12.5-25 mg p.o. nightly Continue CBT with Ms. Shary Decamp  GAD-unstable Cymbalta as prescribed Increase gabapentin to 300 mg p.o. daily in the morning and 600 mg  nightly Gabapentin will also help with her pain. Seroquel 12.5-25 mg p.o. nightly Continue CBT  PTSD-stable Continue CBT  Insomnia likely due to pain-unstable Seroquel 12.5-25 mg p.o. nightly Gabapentin dosage has been increased which may help.  She will benefit from sufficient pain management and she has reached out to her primary pain provider.  Tobacco use disorder-improving Provided counseling for 4 minutes.  I have reviewed notes per Dr. Holley Raring dated 09/23/2021-patient with neck pain and likely will benefit from medial branch nerve block-which was discussed with patient.'  Follow-up in clinic in 10 days or sooner as needed.  This note was generated in part or whole with voice recognition software. Voice recognition is usually quite accurate but there are transcription errors that can and very often do occur. I apologize for any typographical errors that were not detected and corrected.     Ursula Alert, MD 09/23/2021, 10:58 AM

## 2021-09-24 NOTE — Patient Instructions (Signed)

## 2021-09-25 ENCOUNTER — Ambulatory Visit
Admission: RE | Admit: 2021-09-25 | Discharge: 2021-09-25 | Disposition: A | Discharge status: Home / Self Care | Source: Ambulatory Visit | Attending: Student in an Organized Health Care Education/Training Program | Admitting: Student in an Organized Health Care Education/Training Program

## 2021-09-25 ENCOUNTER — Other Ambulatory Visit: Payer: Self-pay

## 2021-09-25 DIAGNOSIS — M5412 Radiculopathy, cervical region: Secondary | ICD-10-CM | POA: Diagnosis not present

## 2021-09-25 DIAGNOSIS — G894 Chronic pain syndrome: Secondary | ICD-10-CM | POA: Insufficient documentation

## 2021-09-25 IMAGING — MR MR CERVICAL SPINE W/O CM
5 series · 42 of 48 positions shown · non-contrast
Comparison: [DATE].

CLINICAL DATA: Cervical radiculopathy, no red flags bilateral UE
weakness/parasthesias, EMG negative

EXAM:
MRI CERVICAL SPINE WITHOUT CONTRAST
TECHNIQUE: Multiplanar, multisequence MR imaging of the cervical spine was
performed. No intravenous contrast was administered.

[Series 10: T2 · sagittal · 3.0mm · 0.78mm/px · 6 of 15 slices shown (1 of 2)]
[im 1/15]
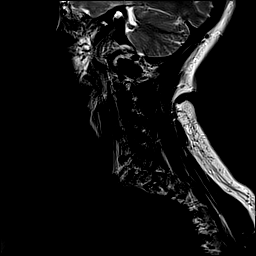
[im 3/15]
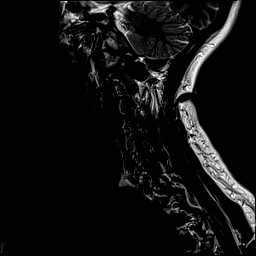
[im 6/15]
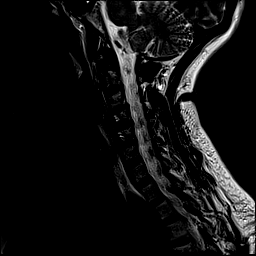
[im 9/15]
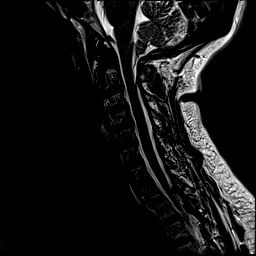
[im 12/15]
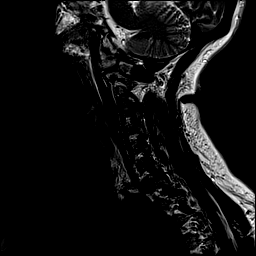
[im 15/15]
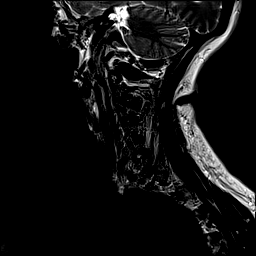

[Series 11: FLAIR · sagittal · 3.0mm · 0.78mm/px · 7 of 15 slices shown]
[im 1/15]
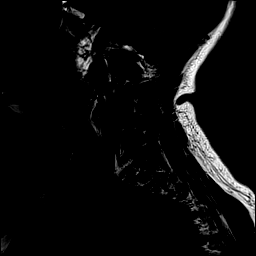
[im 3/15]
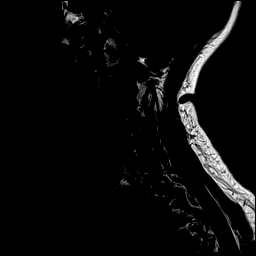
[im 5/15]
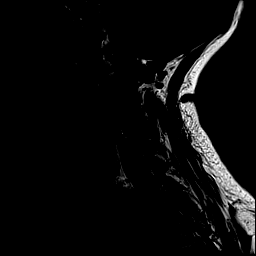
[im 8/15]
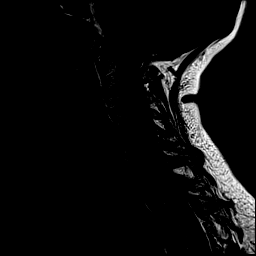
[im 10/15]
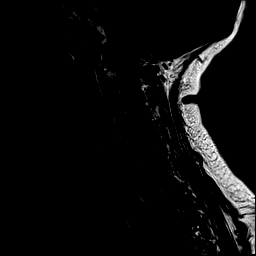
[im 12/15]
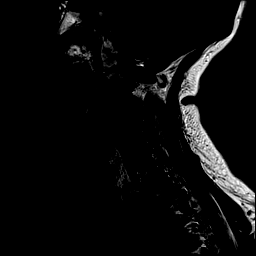
[im 15/15]
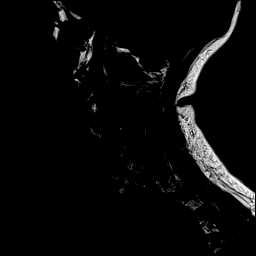

[Series 12: STIR · sagittal · 3.0mm · 0.78mm/px · 7 of 15 slices shown]
[im 1/15]
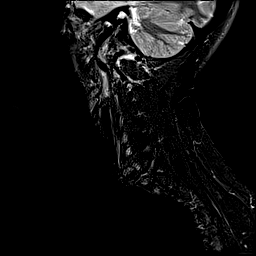
[im 3/15]
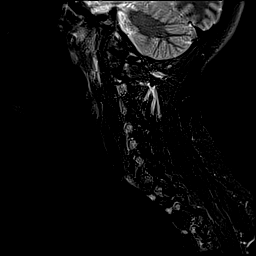
[im 5/15]
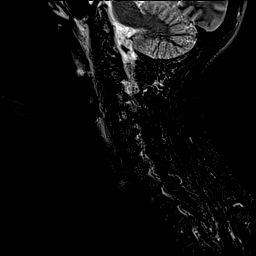
[im 8/15]
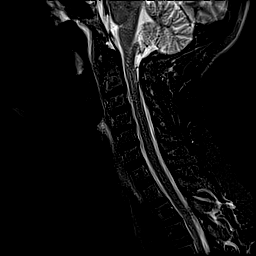
[im 10/15]
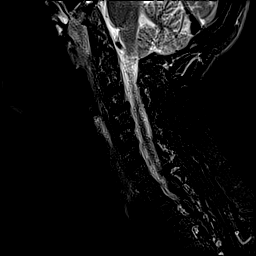
[im 12/15]
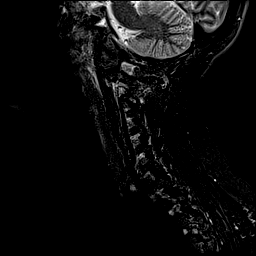
[im 15/15]
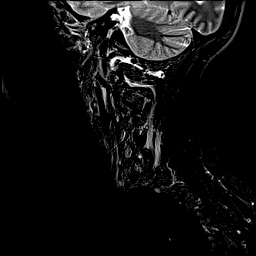

[Series 13: T2 · axial · 3.0mm · 0.70mm/px · z∈[-106,-15]mm · 14 of 29 slices shown (2 of 2)]
[im 1/29]
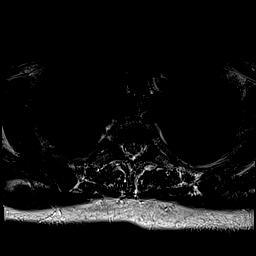
[im 3/29]
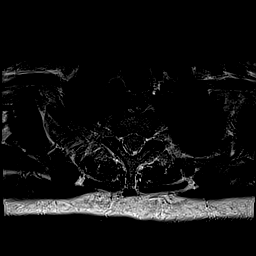
[im 5/29]
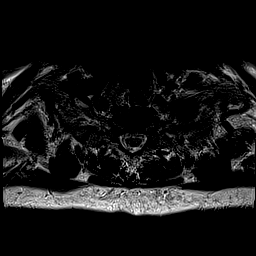
[im 7/29]
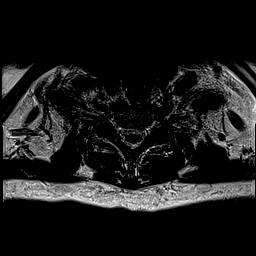
[im 9/29]
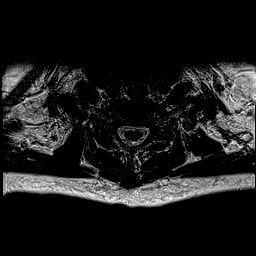
[im 11/29]
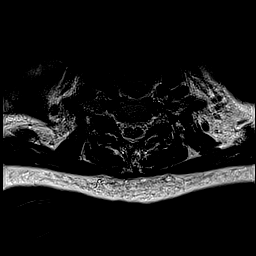
[im 13/29]
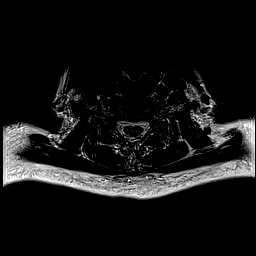
[im 16/29]
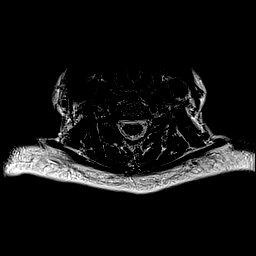
[im 18/29]
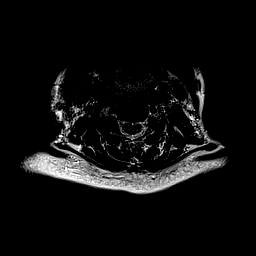
[im 20/29]
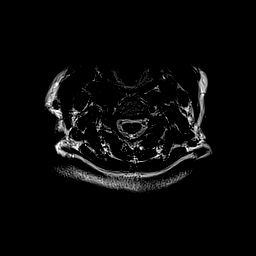
[im 22/29]
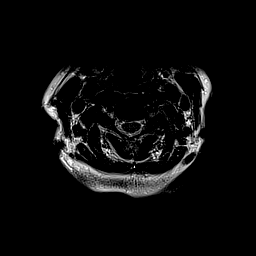
[im 24/29]
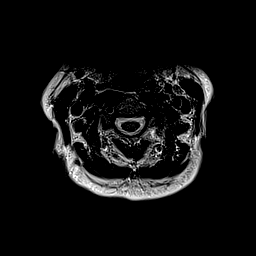
[im 26/29]
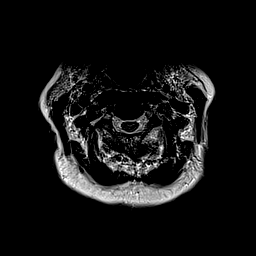
[im 29/29]
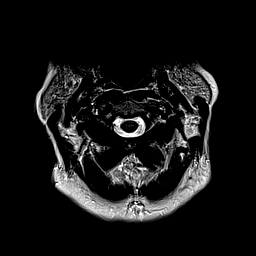

[Series 14: ax mpgr · axial · 3.0mm · 0.35mm/px · z∈[-106,-15]mm · 8 of 29 slices shown]
[im 1/29]
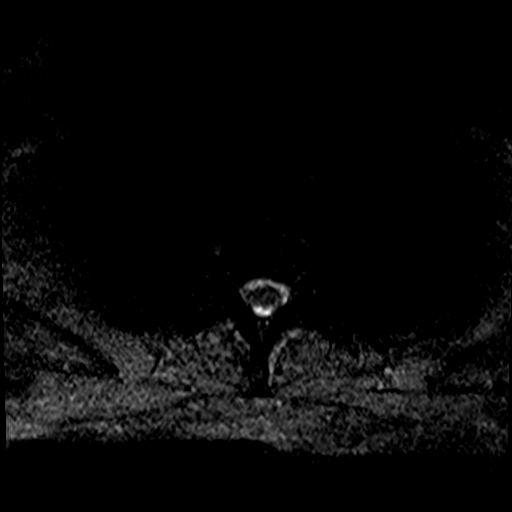
[im 5/29]
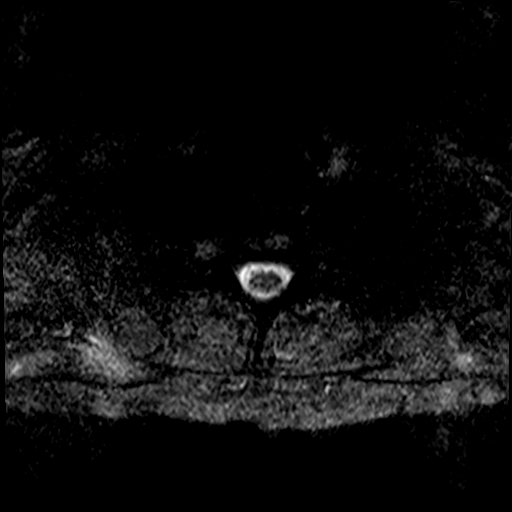
[im 9/29]
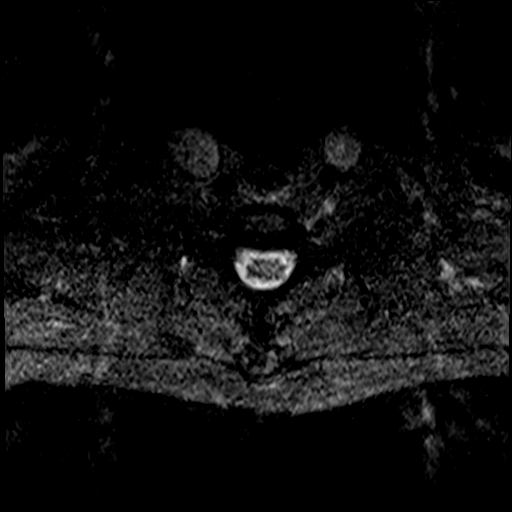
[im 13/29]
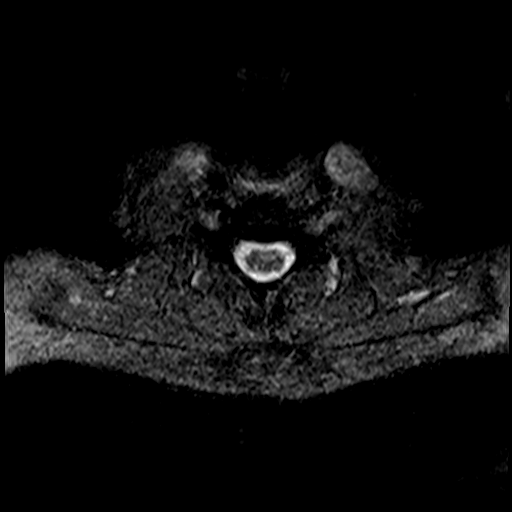
[im 16/29]
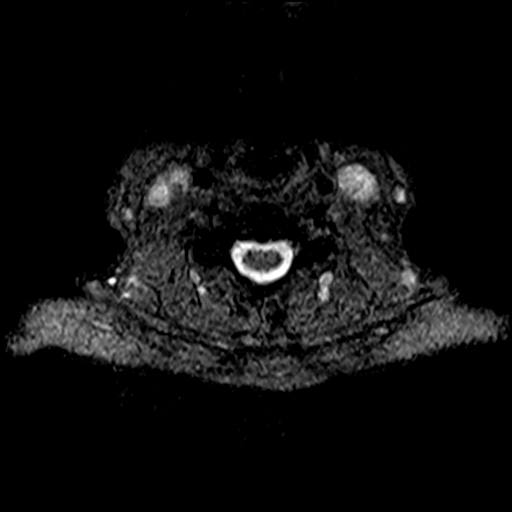
[im 20/29]
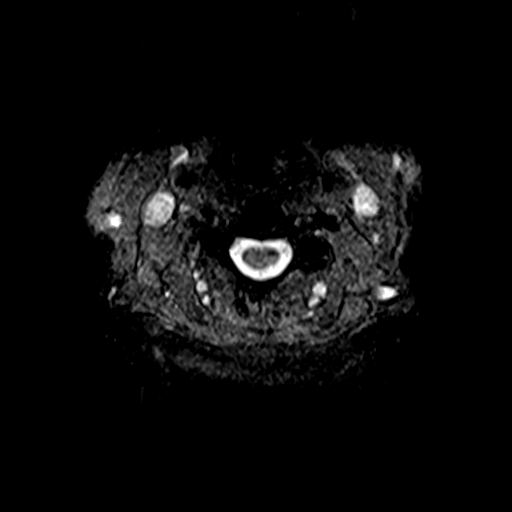
[im 24/29]
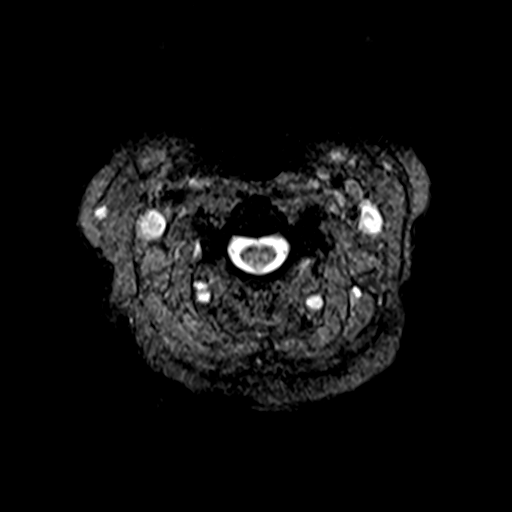
[im 29/29]
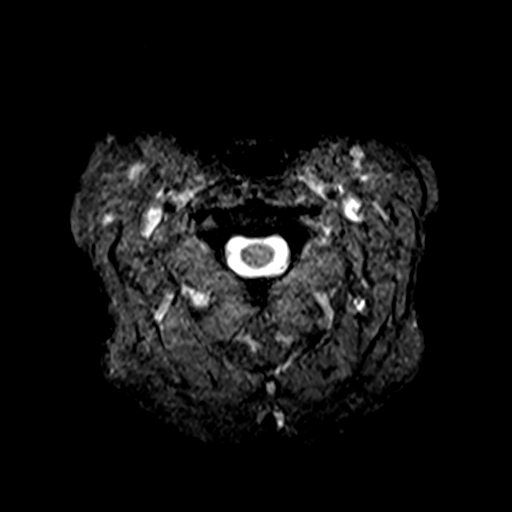

[42 of 48 positions shown; findings below may reference images not displayed]

FINDINGS: Alignment: Slight anterolisthesis of C3 on C4. Straightening of the
normal cervical lordosis.

Vertebrae: Vertebral body heights are maintained. No focal marrow
edema to suggest acute fracture or discitis/osteomyelitis.

Cord: Normal cord signal.

Posterior Fossa, vertebral arteries, paraspinal tissues: Visualized
vertebral artery flow voids are maintained. No evidence of acute
abnormality in the visualized posterior fossa. No appreciable
paraspinal edema.

Disc levels:

C2-C3: Small posterior disc osteophyte complex and bilateral
facet/uncovertebral hypertrophy. No significant canal or foraminal
stenosis.

C3-C4: Posterior disc osteophyte complex, slightly eccentric to the
left. Bilateral facet and uncovertebral hypertrophy. Similar mild
left foraminal stenosis without significant canal or right foraminal
stenosis.

C4-C5: Posterior disc osteophyte complex with right greater than
left facet and uncovertebral hypertrophy. Mild to moderate bilateral
foraminal stenosis, progressed. No significant canal stenosis.

C5-C6: Small posterior disc osteophyte complex. No significant canal
or foraminal stenosis.

C6-C7: Right greater than left facet and uncovertebral hypertrophy
with mild right foraminal stenosis, progressed. No significant canal
stenosis.

C7-T1: No significant disc protrusion, foraminal stenosis, or canal
stenosis.
IMPRESSION: 1. At C4-C5, progressive mild to moderate bilateral foraminal
stenosis.
2. At C6-C7, progressive mild right foraminal stenosis.
3. At C3-C4, similar mild left foraminal stenosis.

## 2021-09-29 ENCOUNTER — Other Ambulatory Visit: Payer: Self-pay

## 2021-09-29 ENCOUNTER — Emergency Department
Admission: EM | Admit: 2021-09-29 | Discharge: 2021-09-29 | Disposition: A | Discharge status: Home / Self Care | Attending: Emergency Medicine | Admitting: Emergency Medicine

## 2021-09-29 ENCOUNTER — Emergency Department

## 2021-09-29 ENCOUNTER — Encounter: Payer: Self-pay | Admitting: Emergency Medicine

## 2021-09-29 DIAGNOSIS — J189 Pneumonia, unspecified organism: Secondary | ICD-10-CM | POA: Diagnosis not present

## 2021-09-29 DIAGNOSIS — Z853 Personal history of malignant neoplasm of breast: Secondary | ICD-10-CM | POA: Insufficient documentation

## 2021-09-29 DIAGNOSIS — G8929 Other chronic pain: Secondary | ICD-10-CM | POA: Insufficient documentation

## 2021-09-29 DIAGNOSIS — M549 Dorsalgia, unspecified: Secondary | ICD-10-CM | POA: Insufficient documentation

## 2021-09-29 DIAGNOSIS — Z7951 Long term (current) use of inhaled steroids: Secondary | ICD-10-CM | POA: Diagnosis not present

## 2021-09-29 DIAGNOSIS — F17210 Nicotine dependence, cigarettes, uncomplicated: Secondary | ICD-10-CM | POA: Diagnosis not present

## 2021-09-29 DIAGNOSIS — Z20822 Contact with and (suspected) exposure to covid-19: Secondary | ICD-10-CM | POA: Insufficient documentation

## 2021-09-29 DIAGNOSIS — J45901 Unspecified asthma with (acute) exacerbation: Secondary | ICD-10-CM | POA: Diagnosis not present

## 2021-09-29 DIAGNOSIS — J441 Chronic obstructive pulmonary disease with (acute) exacerbation: Secondary | ICD-10-CM | POA: Insufficient documentation

## 2021-09-29 DIAGNOSIS — R5381 Other malaise: Secondary | ICD-10-CM | POA: Diagnosis not present

## 2021-09-29 DIAGNOSIS — R0602 Shortness of breath: Secondary | ICD-10-CM | POA: Diagnosis present

## 2021-09-29 LAB — CBC
HCT: 43.7 % (ref 36.0–46.0)
Hemoglobin: 14.2 g/dL (ref 12.0–15.0)
MCH: 30.1 pg (ref 26.0–34.0)
MCHC: 32.5 g/dL (ref 30.0–36.0)
MCV: 92.6 fL (ref 80.0–100.0)
Platelets: 324 10*3/uL (ref 150–400)
RBC: 4.72 MIL/uL (ref 3.87–5.11)
RDW: 12.5 % (ref 11.5–15.5)
WBC: 6.2 10*3/uL (ref 4.0–10.5)
nRBC: 0 % (ref 0.0–0.2)

## 2021-09-29 LAB — COMPREHENSIVE METABOLIC PANEL
ALT: 8 U/L (ref 0–44)
AST: 12 U/L — ABNORMAL LOW (ref 15–41)
Albumin: 3.6 g/dL (ref 3.5–5.0)
Alkaline Phosphatase: 43 U/L (ref 38–126)
Anion gap: 6 (ref 5–15)
BUN: 9 mg/dL (ref 8–23)
CO2: 32 mmol/L (ref 22–32)
Calcium: 9.6 mg/dL (ref 8.9–10.3)
Chloride: 97 mmol/L — ABNORMAL LOW (ref 98–111)
Creatinine, Ser: 0.46 mg/dL (ref 0.44–1.00)
GFR, Estimated: >60 mL/min (ref >60)
Glucose, Bld: 120 mg/dL — ABNORMAL HIGH (ref 70–99)
Potassium: 3.9 mmol/L (ref 3.5–5.1)
Sodium: 135 mmol/L (ref 135–145)
Total Bilirubin: 0.4 mg/dL (ref 0.3–1.2)
Total Protein: 7 g/dL (ref 6.5–8.1)

## 2021-09-29 LAB — RESP PANEL BY RT-PCR (FLU A&B, COVID) ARPGX2
Influenza A by PCR: NEGATIVE
Influenza B by PCR: NEGATIVE
SARS Coronavirus 2 by RT PCR: NEGATIVE

## 2021-09-29 LAB — TROPONIN I (HIGH SENSITIVITY)
Troponin I (High Sensitivity): 7 ng/L (ref <18)
Troponin I (High Sensitivity): 6 ng/L (ref <18)

## 2021-09-29 IMAGING — CR DG CHEST 2V
1 series · 2 of 2 positions shown · non-contrast
Comparison: [DATE]

CLINICAL DATA: Shortness of breath.  Possible large reaction.

EXAM:
CHEST - 2 VIEW

[Series 1: dg chest 2 view · 0.14mm/px · 2 of 2 slices shown]
[im 1/2]
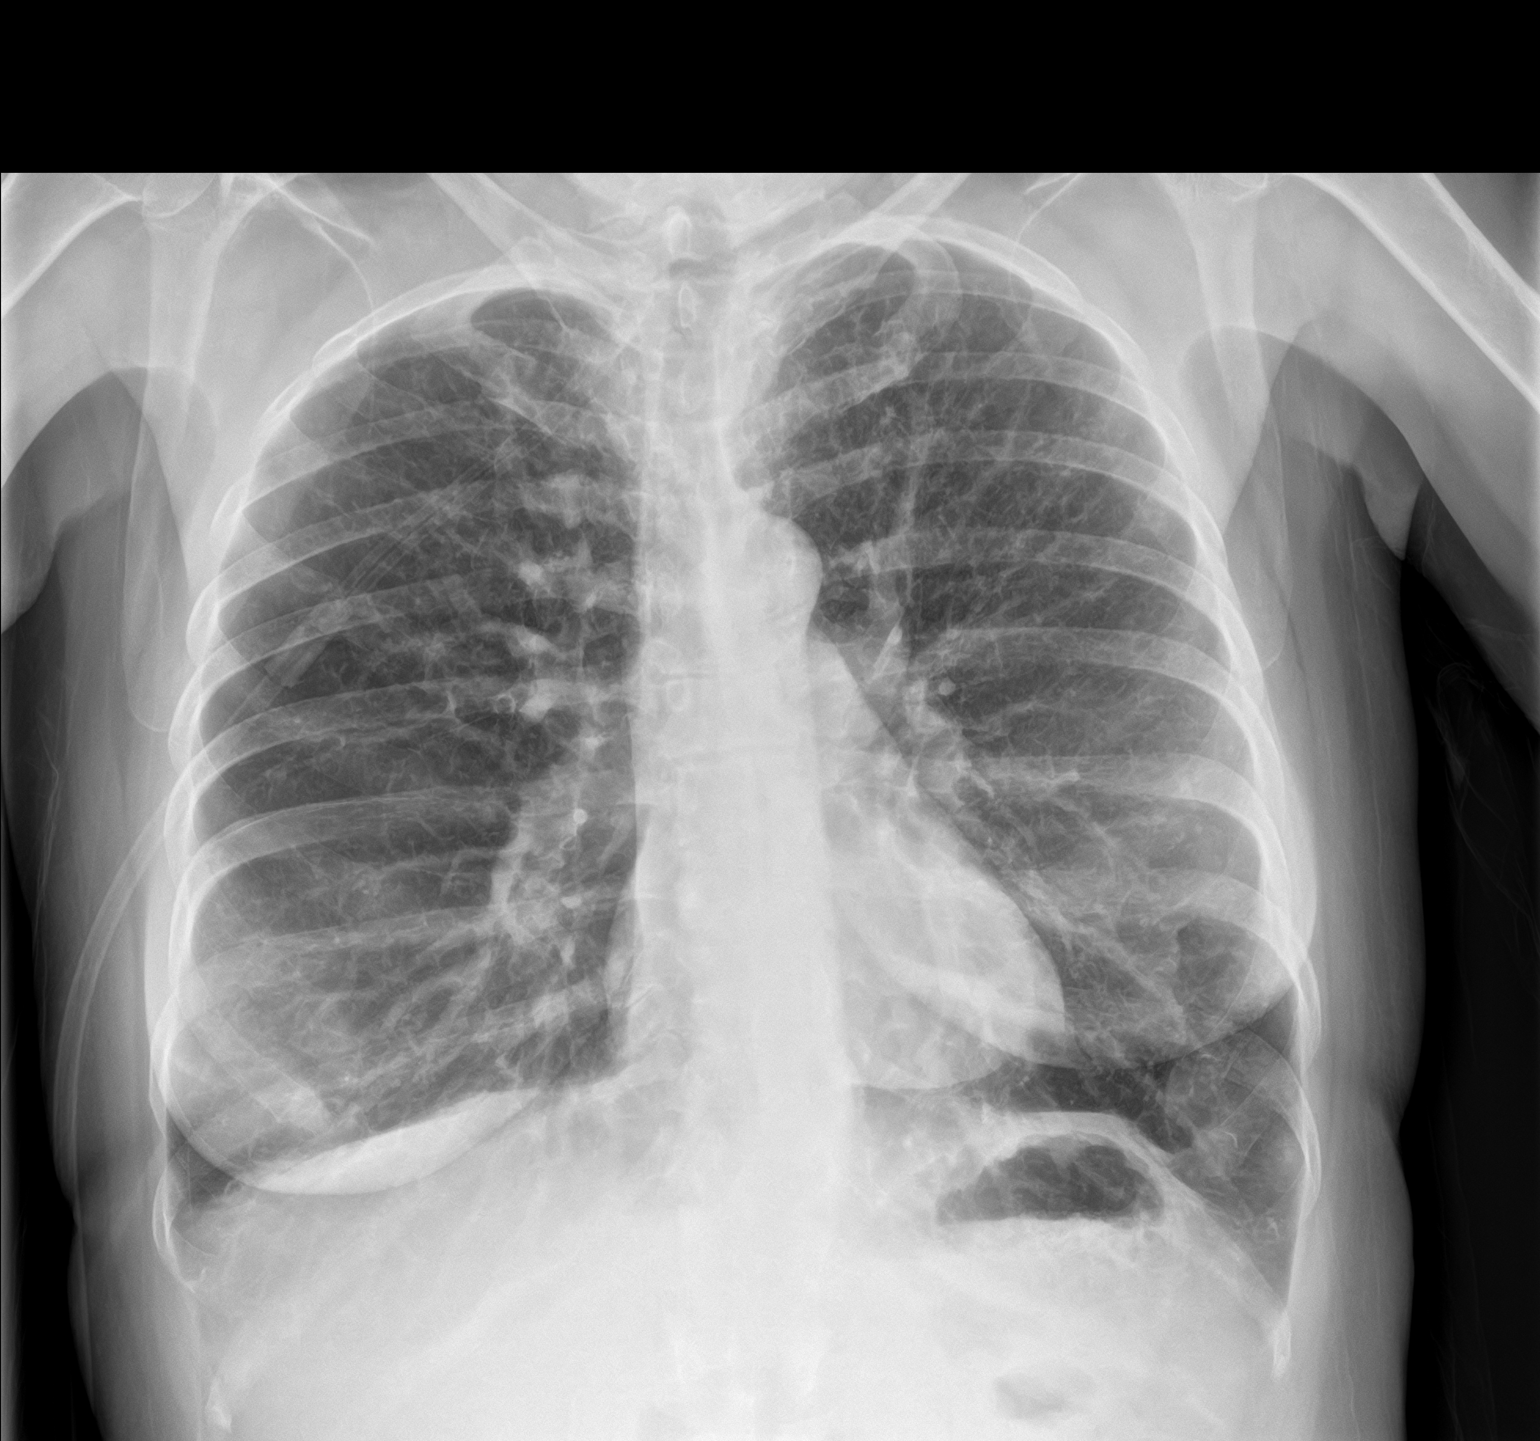
[im 2/2]
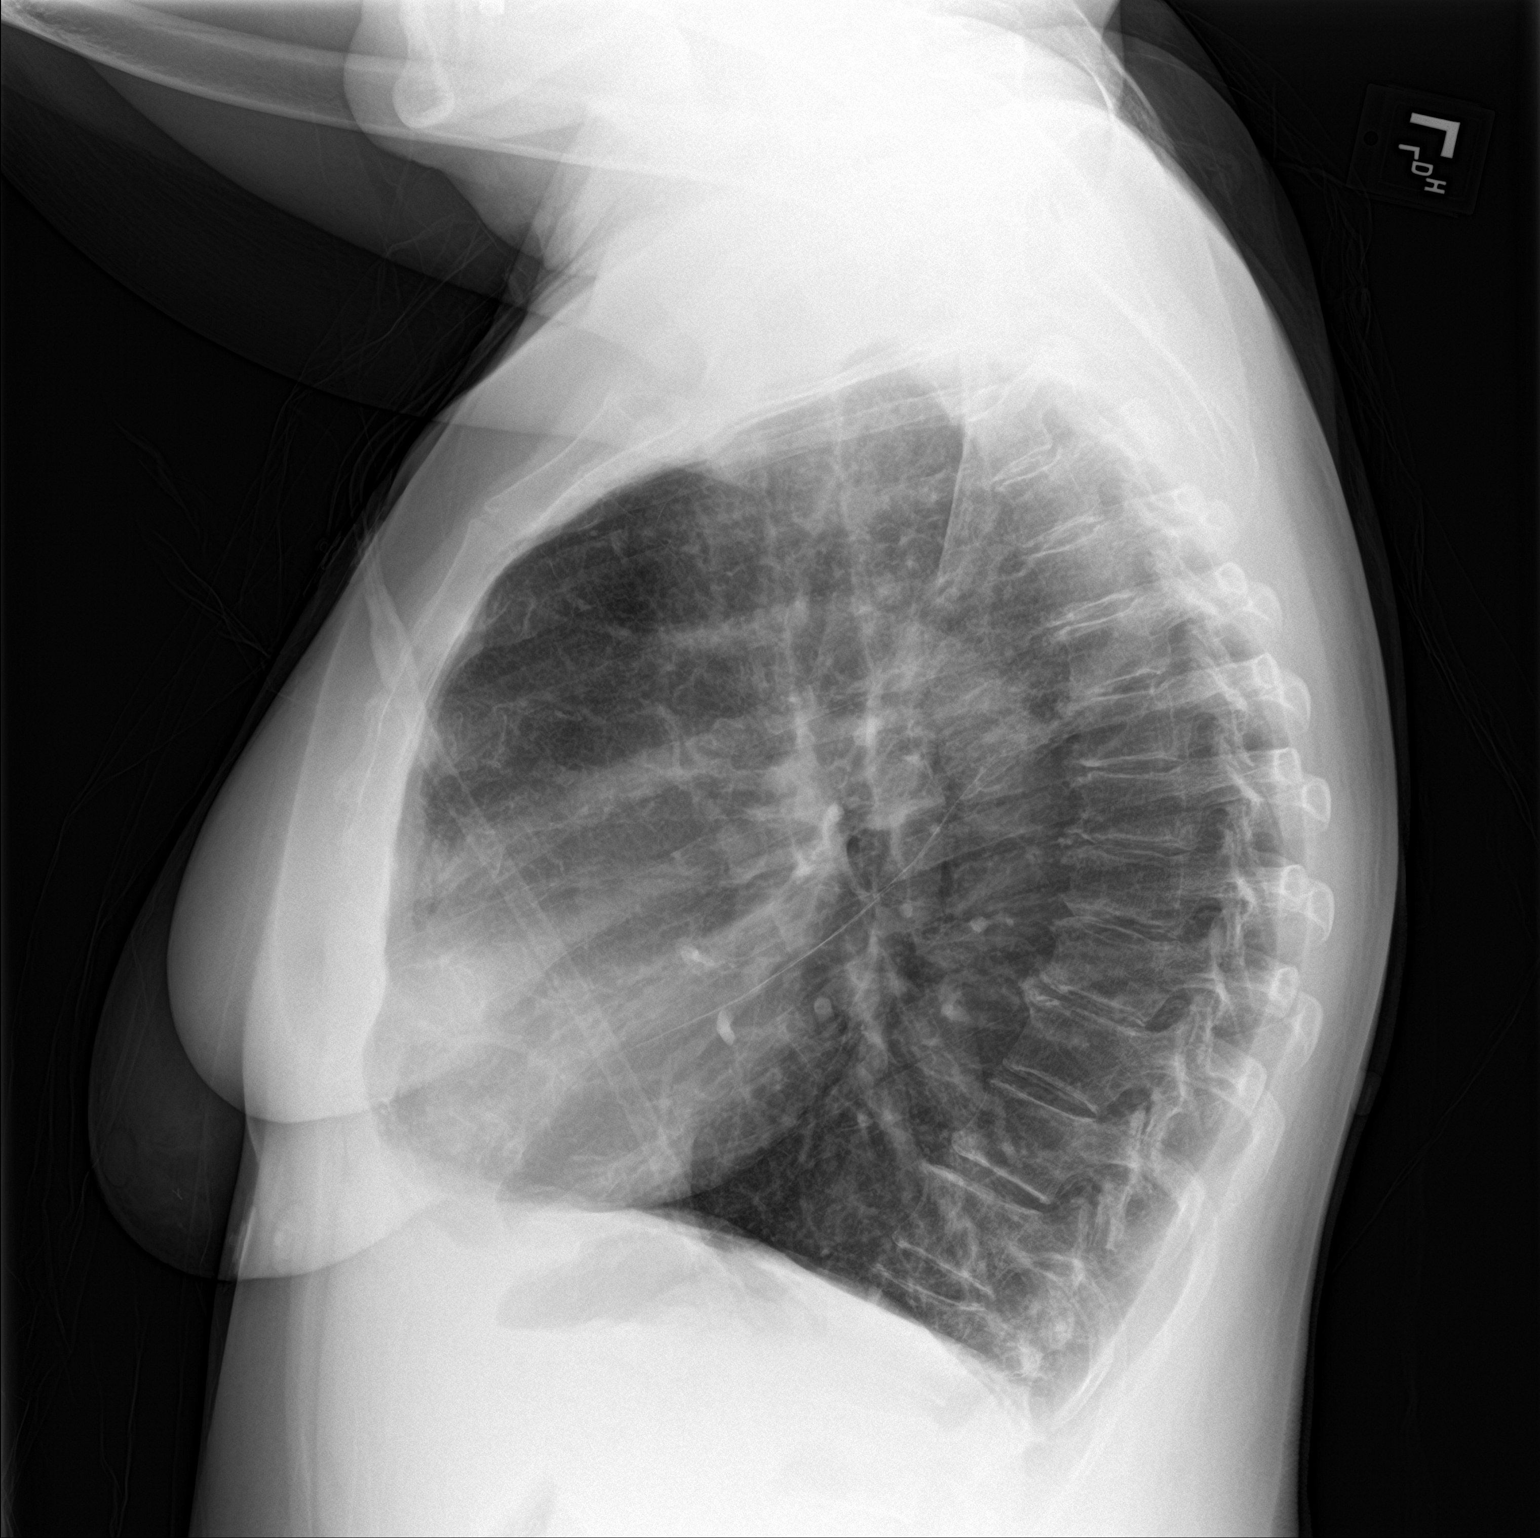

[2 of 2 positions shown; findings below may reference images not displayed]

FINDINGS: Emphysematous changes again seen in the lungs. No pneumothorax.
Increased patchy opacity in the right base. Question subtle opacity
in the left base. The cardiomediastinal silhouette is stable. No
other acute abnormalities.
IMPRESSION: 1. Emphysematous changes in the lungs.
2. Bibasilar opacities worrisome for developing pneumonia. Recommend
short-term follow-up imaging to ensure resolution.

## 2021-09-29 MED ORDER — DOXYCYCLINE HYCLATE 100 MG PO CAPS: 100.0000 mg | ORAL_CAPSULE | Freq: Two times a day (BID) | ORAL | 0 refills | Status: DC

## 2021-09-29 MED ORDER — PREDNISONE 10 MG (21) PO TBPK: ORAL_TABLET | ORAL | 0 refills | Status: DC

## 2021-09-29 MED ORDER — HYDROMORPHONE HCL 2 MG PO TABS
1.0000 mg | ORAL_TABLET | ORAL | Status: AC
  Administered 2021-09-29: 1 mg via ORAL
  Filled 2021-09-29: qty 1

## 2021-09-29 MED ORDER — DIPHENHYDRAMINE HCL 25 MG PO CAPS
25.0000 mg | ORAL_CAPSULE | Freq: Once | ORAL | Status: AC
  Administered 2021-09-29: 25 mg via ORAL
  Filled 2021-09-29: qty 1

## 2021-09-29 MED ORDER — DIPHENHYDRAMINE HCL 25 MG PO TABS: 25.0000 mg | ORAL_TABLET | Freq: Four times a day (QID) | ORAL | 0 refills | Status: DC

## 2021-09-29 MED ORDER — ALBUTEROL SULFATE HFA 108 (90 BASE) MCG/ACT IN AERS
2.0000 | INHALATION_SPRAY | RESPIRATORY_TRACT | Status: DC | PRN
  Filled 2021-09-29: qty 6.7

## 2021-09-29 MED ORDER — DOXYCYCLINE HYCLATE 100 MG PO TABS
100.0000 mg | ORAL_TABLET | Freq: Once | ORAL | Status: AC
  Administered 2021-09-29: 100 mg via ORAL
  Filled 2021-09-29: qty 1

## 2021-09-29 MED ORDER — PREDNISONE 20 MG PO TABS
60.0000 mg | ORAL_TABLET | Freq: Once | ORAL | Status: AC
  Administered 2021-09-29: 60 mg via ORAL
  Filled 2021-09-29: qty 3

## 2021-09-29 MED ORDER — GUAIFENESIN ER 600 MG PO TB12: 600.0000 mg | ORAL_TABLET | Freq: Two times a day (BID) | ORAL | 0 refills | Status: DC

## 2021-09-29 MED ORDER — GUAIFENESIN ER 600 MG PO TB12: 600.0000 mg | ORAL_TABLET | Freq: Two times a day (BID) | ORAL | 0 refills | Status: AC

## 2021-09-29 NOTE — ED Provider Notes (Signed)
Emergency Medicine Provider Triage Evaluation Note  Colleen [name] , a 60 y.o. female  was evaluated in triage.  Pt complains of what she thought was an allergic reaction.  Patient states that she has had some diffuse itching, burning.  She had some rash on her hands that had gone away.  The only thing that she knows that she came in contact with is some new wreaths.  States that she does not feel short of breath but was noted to be hypoxic in the low 80s.  Patient does have a history of COPD and states that she uses oxygen at nighttime but not during the day.  Normal O2 sat is around 90% for the patient.  Patient denied any vomiting, chest pain, abdominal pain..  Patient does have a history of COPD.  Review of Systems  Positive: Short of breath, generalized itching Negative: Rash, emesis, chest pain, GI symptoms, urinary symptoms  Physical Exam  BP 135/85   Pulse (!) 107   Temp 98 F (36.7 C)   Resp (!) 22   SpO2 (!) 83%  Gen:   Awake, no distress   Resp:  Increased respiratory effort with faint expiratory wheeze MSK:   Moves extremities without difficulty  Other:  No identified rashes.  No peripheral edema identified.  Medical Decision Making  Medically screening exam initiated at 5:08 PM.  Appropriate orders placed.  Colleen [name] was informed that the remainder of the evaluation will be completed by another provider, this initial triage assessment does not replace that evaluation, and the importance of remaining in the ED until their evaluation is complete.  Patient presents with hypoxia, generalized pruritus.  Patient states that she thinks that she may have had allergic reaction to some new Christmas wreaths she bought.  No rash or emesis.  Symptoms have been ongoing x3 days.  Patient arrived hypoxic, in the low 80s.  Only improved to 85 with 2 L of oxygen.  Does have a history of COPD and asthma.  Faint expiratory wheeze and increased work of breathing.  Patient will have labs, chest  x-ray, EKG at this time.   Colleen [name] 09/29/21 1712    Colleen Divine, MD 09/30/21 [phone]

## 2021-09-29 NOTE — ED Notes (Signed)
Pt placed on 2L at this time. O2 now at

## 2021-09-29 NOTE — ED Notes (Signed)
O2 increased to 85% on 2L [place]

## 2021-09-29 NOTE — ED Triage Notes (Addendum)
Pt comes with c/o possible allergic reaction for three days. Pt states itching and burning all over. Pt speaking in complete sentences. Pt states bad drainage. Pt denies any trouble swallowing.  Pt states hx of COPD and asthma.  Pt wears 2L at night as needed.

## 2021-09-29 NOTE — ED Provider Notes (Signed)
[facility] Emergency Department Provider Note  ____________________________________________  Time seen: Approximately 10:12 PM  I have reviewed the triage vital signs and the nursing notes.   HISTORY  Chief Complaint Allergic Reaction    HPI Colleen [name] is a 60 y.o. female with a history of COPD on 2 L nasal cannula at home, chronic back pain, breast cancer who comes ED complaining of worsening of her chronic back pain over the last few days, also associated with malaise and increased shortness of breath compared to usual.  Denies chest pain.  Has increased cough.  No fever or chills.  No leg swelling, no trauma.  Symptoms are constant, no aggravating or alleviating factors.    Past Medical History:  Diagnosis Date   Anxiety    Asthma    Breast cancer ([place]) 2004   left breast   Chronic back pain    COPD (chronic obstructive pulmonary disease) ([place])    Depression    Personal history of radiation therapy      Patient Active Problem List   Diagnosis Date Noted   MDD (major depressive disorder), recurrent, in full remission ([place]) 07/29/2021   Tobacco use disorder 06/19/2021   Insomnia due to medical condition 03/21/2021   MDD (major depressive disorder), recurrent episode, moderate ([place]) 03/04/2021   PTSD (post-traumatic stress disorder) 03/04/2021   Myofascial pain syndrome 07/09/2020   Lumbar radiculopathy 07/09/2020   Chronic pain syndrome 06/04/2020   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, L5/S1) 06/04/2020   Lumbar facet arthropathy (L4,5 S1) 06/04/2020   Cervical facet joint syndrome 06/04/2020   Osteopenia of multiple sites 06/04/2020   Age related osteoporosis 06/04/2020   Low back pain 04/27/2020   COPD exacerbation ([place]) 03/20/2020   Asthma, chronic, unspecified asthma severity, with acute exacerbation 03/20/2020   Panic attack 03/20/2020   Acute on chronic respiratory failure with hypoxia ([place])    Sepsis ([place]) 10/11/2018   CAP  (community acquired pneumonia) 10/11/2018   COPD (chronic obstructive pulmonary disease) ([place]) 10/11/2018   GAD (generalized anxiety disorder) 10/11/2018     Past Surgical History:  Procedure Laterality Date   BREAST BIOPSY Left 2004   positive   BREAST BIOPSY Right    neg   BREAST LUMPECTOMY Left 2004   positive   MASTECTOMY Left    MASTECTOMY     NEPHRECTOMY Left    TUBAL LIGATION       Prior to Admission medications   Medication Sig Start Date End Date Taking? Authorizing Provider  cetirizine-pseudoephedrine (ZYRTEC-D) 5-120 MG tablet Take 1 tablet by mouth 2 (two) times daily as needed for allergies.    [provider]  Cholecalciferol 25 MCG (1000 UT) tablet Take by mouth.    [provider]  denosumab (PROLIA) 60 MG/ML SOSY injection Inject 60 mg into the skin every 6 (six) months.    [provider]  diphenhydrAMINE (BENADRYL) 25 MG tablet Take 1 tablet (25 mg total) by mouth every 6 (six) hours for 3 days. 09/29/21 10/02/21  Carrie Mew, MD  doxycycline (VIBRAMYCIN) 100 MG capsule Take 1 capsule (100 mg total) by mouth 2 (two) times daily. 09/29/21   Carrie Mew, MD  DULoxetine (CYMBALTA) 60 MG capsule Take 1 capsule (60 mg total) by mouth 2 (two) times daily. 07/29/21   Ursula Alert, MD  EPINEPHrine 0.3 mg/0.3 mL IJ SOAJ injection Inject 0.3 mg into the muscle as needed.    [provider]  Fluticasone-Umeclidin-Vilant (TRELEGY ELLIPTA) 100-62.5-25 MCG/INH AEPB Inhale 1  puff into the lungs daily.    [provider]  gabapentin (NEURONTIN) 300 MG capsule Take 1-2 capsules (300-600 mg total) by mouth as directed. Take 1 capsule daily AM and 2 capsules daily at bedtime 09/23/21   Ursula Alert, MD  guaiFENesin (MUCINEX) 600 MG 12 hr tablet Take 1 tablet (600 mg total) by mouth 2 (two) times daily for 15 days. 09/29/21 10/14/21  Carrie Mew, MD  ipratropium (ATROVENT) 0.02 % nebulizer solution Take by nebulization  4 (four) times daily. 03/26/21   [provider]  montelukast (SINGULAIR) 10 MG tablet Take 10 mg by mouth at bedtime.    [provider]  predniSONE (STERAPRED UNI-PAK 21 TAB) 10 MG (21) TBPK tablet 6 tablets on day 1, then 5 tablets on day 2, then 4 tablets on day 3, then 3 tablets on day 4, then 2 tablets on day 5, then 1 tablet on day 6. 09/29/21   Carrie Mew, MD  QUEtiapine (SEROQUEL) 25 MG tablet Take 0.5-1 tablets (12.5-25 mg total) by mouth at bedtime. 07/29/21   Ursula Alert, MD  VENTOLIN HFA 108 (90 Base) MCG/ACT inhaler Inhale 1-2 puffs into the lungs every 4 (four) hours as needed for cough or wheezing. 10/02/18   [provider]     Allergies Desvenlafaxine, Morphine and related, Penicillins, Prazosin, Tamoxifen, Trazodone, Clonazepam, Codeine, Hydroxyzine, Lorazepam, Paroxetine hcl, Sulfa antibiotics, and Cephalexin   Family History  Problem Relation Age of Onset   Breast cancer Cousin    Breast cancer Cousin    Bipolar disorder Daughter    Drug abuse Daughter    Alcohol abuse Maternal Grandfather     Social History Social History   Tobacco Use   Smoking status: Every Day    Packs/day: 0.50    Types: Cigarettes   Smokeless tobacco: Never  Vaping Use   Vaping Use: Never used  Substance Use Topics   Alcohol use: Yes   Drug use: Never    Review of Systems  Constitutional:   No fever or chills.  ENT:   No sore throat. No rhinorrhea. Cardiovascular:   No chest pain or syncope. Respiratory:   Positive shortness of breath and nonproductive cough. Gastrointestinal:   Negative for abdominal pain, vomiting and diarrhea.  Musculoskeletal:   Negative for focal pain or swelling All other systems reviewed and are negative except as documented above in ROS and HPI.  ____________________________________________   PHYSICAL EXAM:  VITAL SIGNS: ED Triage Vitals  Enc Vitals Group     BP 09/29/21 1700 135/85     Pulse Rate 09/29/21  1700 (!) 107     Resp 09/29/21 1700 (!) 22     Temp 09/29/21 1700 98 F (36.7 C)     Temp Source 09/29/21 1909 Oral     SpO2 09/29/21 1700 (!) 83 %     Weight 09/29/21 2130 136 lb (61.7 kg)     Height 09/29/21 2130 5' (1.524 m)     Head Circumference --      Peak Flow --      Pain Score 09/29/21 1657 6     Pain Loc --      Pain Edu? --      Excl. in [place]? --     Vital signs reviewed, nursing assessments reviewed.   Constitutional:   Alert and oriented. Non-toxic appearance. Eyes:   Conjunctivae are normal. EOMI. PERRL. ENT      Head:   Normocephalic and atraumatic.  Nose:   Wearing a mask.      Mouth/Throat:   Wearing a mask.      Neck:   No meningismus. Full ROM. Hematological/Lymphatic/Immunilogical:   No cervical lymphadenopathy. Cardiovascular:   RRR. Symmetric bilateral radial and DP pulses.  No murmurs. Cap refill less than 2 seconds. Respiratory:   Normal respiratory effort without tachypnea/retractions.  Bilateral basilar crackles, right greater than left.  Scant expiratory wheezing. Gastrointestinal:   Soft and nontender. Non distended. There is no CVA tenderness.  No rebound, rigidity, or guarding. Genitourinary:   deferred Musculoskeletal:   Normal range of motion in all extremities. No joint effusions.  No lower extremity tenderness.  No edema. Neurologic:   Normal speech and language.  Motor grossly intact. No acute focal neurologic deficits are appreciated.  Skin:    Skin is warm, dry and intact. No rash noted.  No petechiae, purpura, or bullae.  ____________________________________________    LABS (pertinent positives/negatives) (all labs ordered are listed, but only abnormal results are displayed) Labs Reviewed  COMPREHENSIVE METABOLIC PANEL - Abnormal; Notable for the following components:      Result Value   Chloride 97 (*)    Glucose, Bld 120 (*)    AST 12 (*)    All other components within normal limits  RESP PANEL BY RT-PCR (FLU A&B, COVID)  ARPGX2  CBC  TROPONIN I (HIGH SENSITIVITY)  TROPONIN I (HIGH SENSITIVITY)   ____________________________________________   EKG  Interpreted by me Sinus tachycardia rate 103.  Normal axis, normal intervals.  Normal QRS ST segments and T waves.  No ischemic changes.  No evidence of right heart strain.  ____________________________________________    RADIOLOGY  DG Chest 2 View  Result Date: 09/29/2021 CLINICAL DATA:  Shortness of breath.  Possible large reaction. EXAM: CHEST - 2 VIEW COMPARISON:  May 05, 2021 FINDINGS: Emphysematous changes again seen in the lungs. No pneumothorax. Increased patchy opacity in the right base. Question subtle opacity in the left base. The cardiomediastinal silhouette is stable. No other acute abnormalities. IMPRESSION: 1. Emphysematous changes in the lungs. 2. Bibasilar opacities worrisome for developing pneumonia. Recommend short-term follow-up imaging to ensure resolution. Electronically Signed   By: Dorise Bullion III M.D.   On: 09/29/2021 17:56    ____________________________________________   PROCEDURES Procedures  ____________________________________________  DIFFERENTIAL DIAGNOSIS   Pneumonia, COPD exacerbation, pleural effusion, viral illness, non-STEMI  CLINICAL IMPRESSION / ASSESSMENT AND PLAN / ED COURSE  Medications ordered in the ED: Medications  albuterol (VENTOLIN HFA) 108 (90 Base) MCG/ACT inhaler 2 puff (has no administration in time range)  HYDROmorphone (DILAUDID) tablet 1 mg (has no administration in time range)  predniSONE (DELTASONE) tablet 60 mg (has no administration in time range)  diphenhydrAMINE (BENADRYL) capsule 25 mg (has no administration in time range)  doxycycline (VIBRA-TABS) tablet 100 mg (has no administration in time range)    Pertinent labs & imaging results that were available during my care of the patient were reviewed by me and considered in my medical decision making (see chart for  details).  Falana Clagg was evaluated in Emergency Department on 09/29/2021 for the symptoms described in the history of present illness. She was evaluated in the context of the global COVID-19 pandemic, which necessitated consideration that the patient might be at risk for infection with the SARS-CoV-2 virus that causes COVID-19. Institutional protocols and algorithms that pertain to the evaluation of patients at risk for COVID-19 are in a state of rapid change based on information released  by regulatory bodies including the CDC and federal and state organizations. These policies and algorithms were followed during the patient's care in the ED.   Patient presents with shortness of breath and cough.  Chest x-ray shows basilar opacities, consistent with exam finding of basilar crackles and concerning for community-acquired pneumonia.  Labs unremarkable, COVID and flu negative.  She is not septic, doubt ACS PE dissection.   Engaged in shared decision-making with the patient regarding hospitalization versus outpatient treatment.  Patient feels comfortable with outpatient management for now.  I think this is reasonable.  We will give a dose of medicine here tonight and then send prescriptions to her pharmacy.     ____________________________________________   FINAL CLINICAL IMPRESSION(S) / ED DIAGNOSES    Final diagnoses:  Community acquired pneumonia of right lower lobe of lung  COPD exacerbation ([place])     ED Discharge Orders          Ordered    doxycycline (VIBRAMYCIN) 100 MG capsule  2 times daily,   Status:  Discontinued        09/29/21 2203    predniSONE (STERAPRED UNI-PAK 21 TAB) 10 MG (21) TBPK tablet  Status:  Discontinued        09/29/21 2203    diphenhydrAMINE (BENADRYL) 25 MG tablet  Every 6 hours,   Status:  Discontinued        09/29/21 2203    guaiFENesin (MUCINEX) 600 MG 12 hr tablet  2 times daily,   Status:  Discontinued        09/29/21 2203    diphenhydrAMINE  (BENADRYL) 25 MG tablet  Every 6 hours        09/29/21 2211    doxycycline (VIBRAMYCIN) 100 MG capsule  2 times daily        09/29/21 2211    guaiFENesin (MUCINEX) 600 MG 12 hr tablet  2 times daily        09/29/21 2211    predniSONE (STERAPRED UNI-PAK 21 TAB) 10 MG (21) TBPK tablet        09/29/21 2211            Portions of this note were generated with dragon dictation software. Dictation errors may occur despite best attempts at proofreading.    Carrie Mew, MD 09/29/21 2218

## 2021-10-07 ENCOUNTER — Telehealth: Admitting: Psychiatry

## 2021-10-07 ENCOUNTER — Other Ambulatory Visit: Payer: Self-pay

## 2021-10-08 ENCOUNTER — Telehealth: Payer: Self-pay | Admitting: Student in an Organized Health Care Education/Training Program

## 2021-10-08 DIAGNOSIS — M7918 Myalgia, other site: Secondary | ICD-10-CM

## 2021-10-08 NOTE — Telephone Encounter (Signed)
Patient states she is having muscle spasms all over her back down into the legs. She wants to know if she can get muscle relaxer sent in to help until her appt on 12-7 for a procedure. Please advise patient

## 2021-10-09 MED ORDER — TIZANIDINE HCL 4 MG PO TABS: 4.0000 mg | ORAL_TABLET | Freq: Two times a day (BID) | ORAL | 0 refills | Status: AC | PRN

## 2021-10-09 NOTE — Telephone Encounter (Signed)
I left patient a vmail telling her about the script sent in for her.

## 2021-10-12 ENCOUNTER — Emergency Department

## 2021-10-12 ENCOUNTER — Emergency Department
Admission: EM | Admit: 2021-10-12 | Discharge: 2021-10-12 | Disposition: A | Discharge status: Home / Self Care | Attending: Emergency Medicine | Admitting: Emergency Medicine

## 2021-10-12 ENCOUNTER — Other Ambulatory Visit: Payer: Self-pay

## 2021-10-12 DIAGNOSIS — G8929 Other chronic pain: Secondary | ICD-10-CM | POA: Diagnosis not present

## 2021-10-12 DIAGNOSIS — J45909 Unspecified asthma, uncomplicated: Secondary | ICD-10-CM | POA: Diagnosis not present

## 2021-10-12 DIAGNOSIS — J449 Chronic obstructive pulmonary disease, unspecified: Secondary | ICD-10-CM | POA: Diagnosis not present

## 2021-10-12 DIAGNOSIS — Z853 Personal history of malignant neoplasm of breast: Secondary | ICD-10-CM | POA: Insufficient documentation

## 2021-10-12 DIAGNOSIS — F17210 Nicotine dependence, cigarettes, uncomplicated: Secondary | ICD-10-CM | POA: Diagnosis not present

## 2021-10-12 DIAGNOSIS — M545 Low back pain, unspecified: Secondary | ICD-10-CM | POA: Diagnosis not present

## 2021-10-12 DIAGNOSIS — M549 Dorsalgia, unspecified: Secondary | ICD-10-CM | POA: Diagnosis present

## 2021-10-12 LAB — BASIC METABOLIC PANEL
Anion gap: 9 (ref 5–15)
BUN: 6 mg/dL — ABNORMAL LOW (ref 8–23)
CO2: 27 mmol/L (ref 22–32)
Calcium: 8.8 mg/dL — ABNORMAL LOW (ref 8.9–10.3)
Chloride: 94 mmol/L — ABNORMAL LOW (ref 98–111)
Creatinine, Ser: 0.41 mg/dL — ABNORMAL LOW (ref 0.44–1.00)
GFR, Estimated: >60 mL/min (ref >60)
Glucose, Bld: 98 mg/dL (ref 70–99)
Potassium: 4.2 mmol/L (ref 3.5–5.1)
Sodium: 130 mmol/L — ABNORMAL LOW (ref 135–145)

## 2021-10-12 LAB — CBC WITH DIFFERENTIAL/PLATELET
Abs Immature Granulocytes: 0.02 10*3/uL (ref 0.00–0.07)
Basophils Absolute: 0.1 10*3/uL (ref 0.0–0.1)
Basophils Relative: 1 %
Eosinophils Absolute: 0.2 10*3/uL (ref 0.0–0.5)
Eosinophils Relative: 4 %
HCT: 43.1 % (ref 36.0–46.0)
Hemoglobin: 14.1 g/dL (ref 12.0–15.0)
Immature Granulocytes: 0 %
Lymphocytes Relative: 29 %
Lymphs Abs: 1.8 10*3/uL (ref 0.7–4.0)
MCH: 30.1 pg (ref 26.0–34.0)
MCHC: 32.7 g/dL (ref 30.0–36.0)
MCV: 92.1 fL (ref 80.0–100.0)
Monocytes Absolute: 0.7 10*3/uL (ref 0.1–1.0)
Monocytes Relative: 11 %
Neutro Abs: 3.4 10*3/uL (ref 1.7–7.7)
Neutrophils Relative %: 55 %
Platelets: 277 10*3/uL (ref 150–400)
RBC: 4.68 MIL/uL (ref 3.87–5.11)
RDW: 13 % (ref 11.5–15.5)
WBC: 6.2 10*3/uL (ref 4.0–10.5)
nRBC: 0 % (ref 0.0–0.2)

## 2021-10-12 IMAGING — CR DG CHEST 2V
1 series · 2 of 2 positions shown · non-contrast
Comparison: Previous studies including the examination of
[DATE]

CLINICAL DATA: Chest pain, back pain

EXAM:
CHEST - 2 VIEW

[Series 1: dg chest 2 view · 0.14mm/px · 2 of 2 slices shown]
[im 1/2]
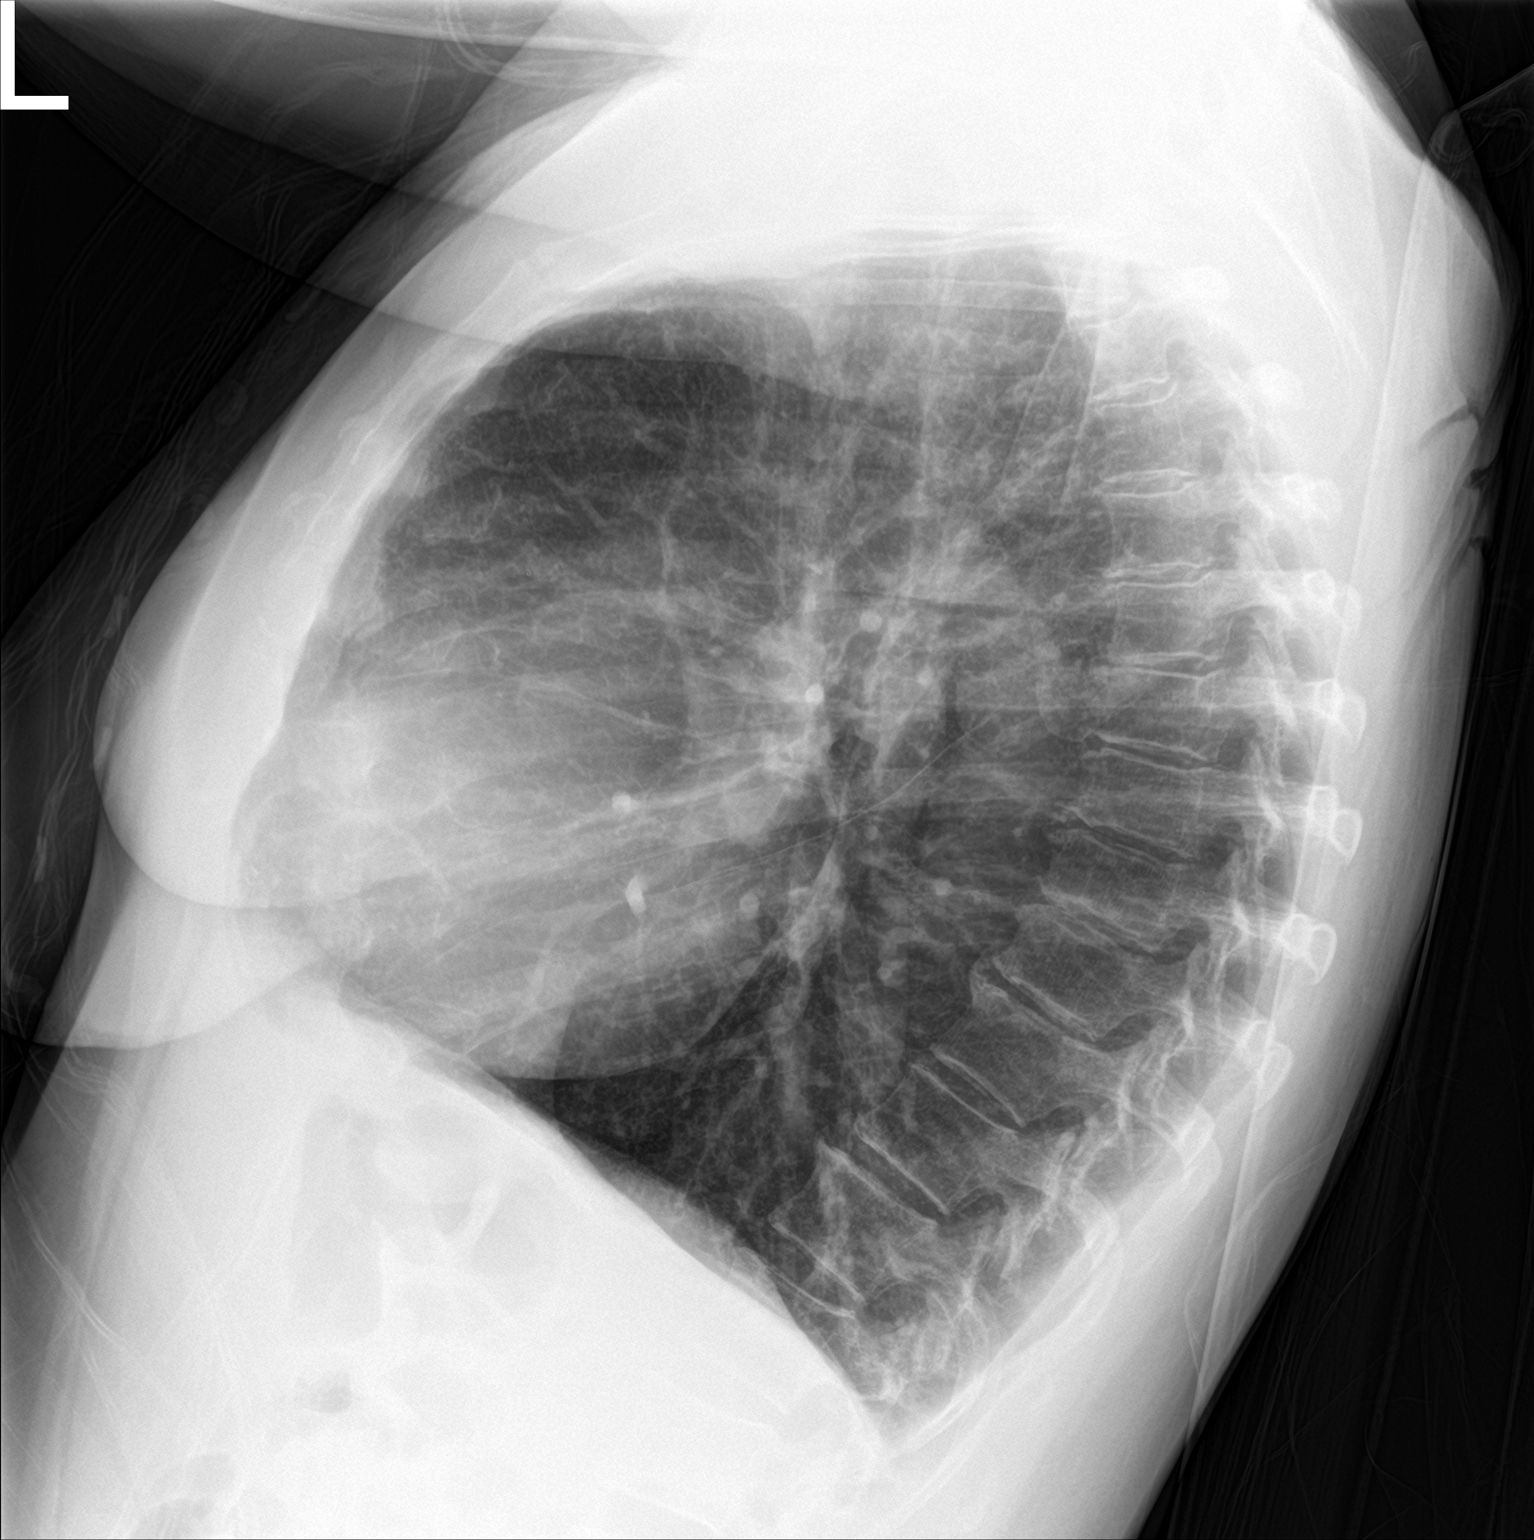
[im 2/2]
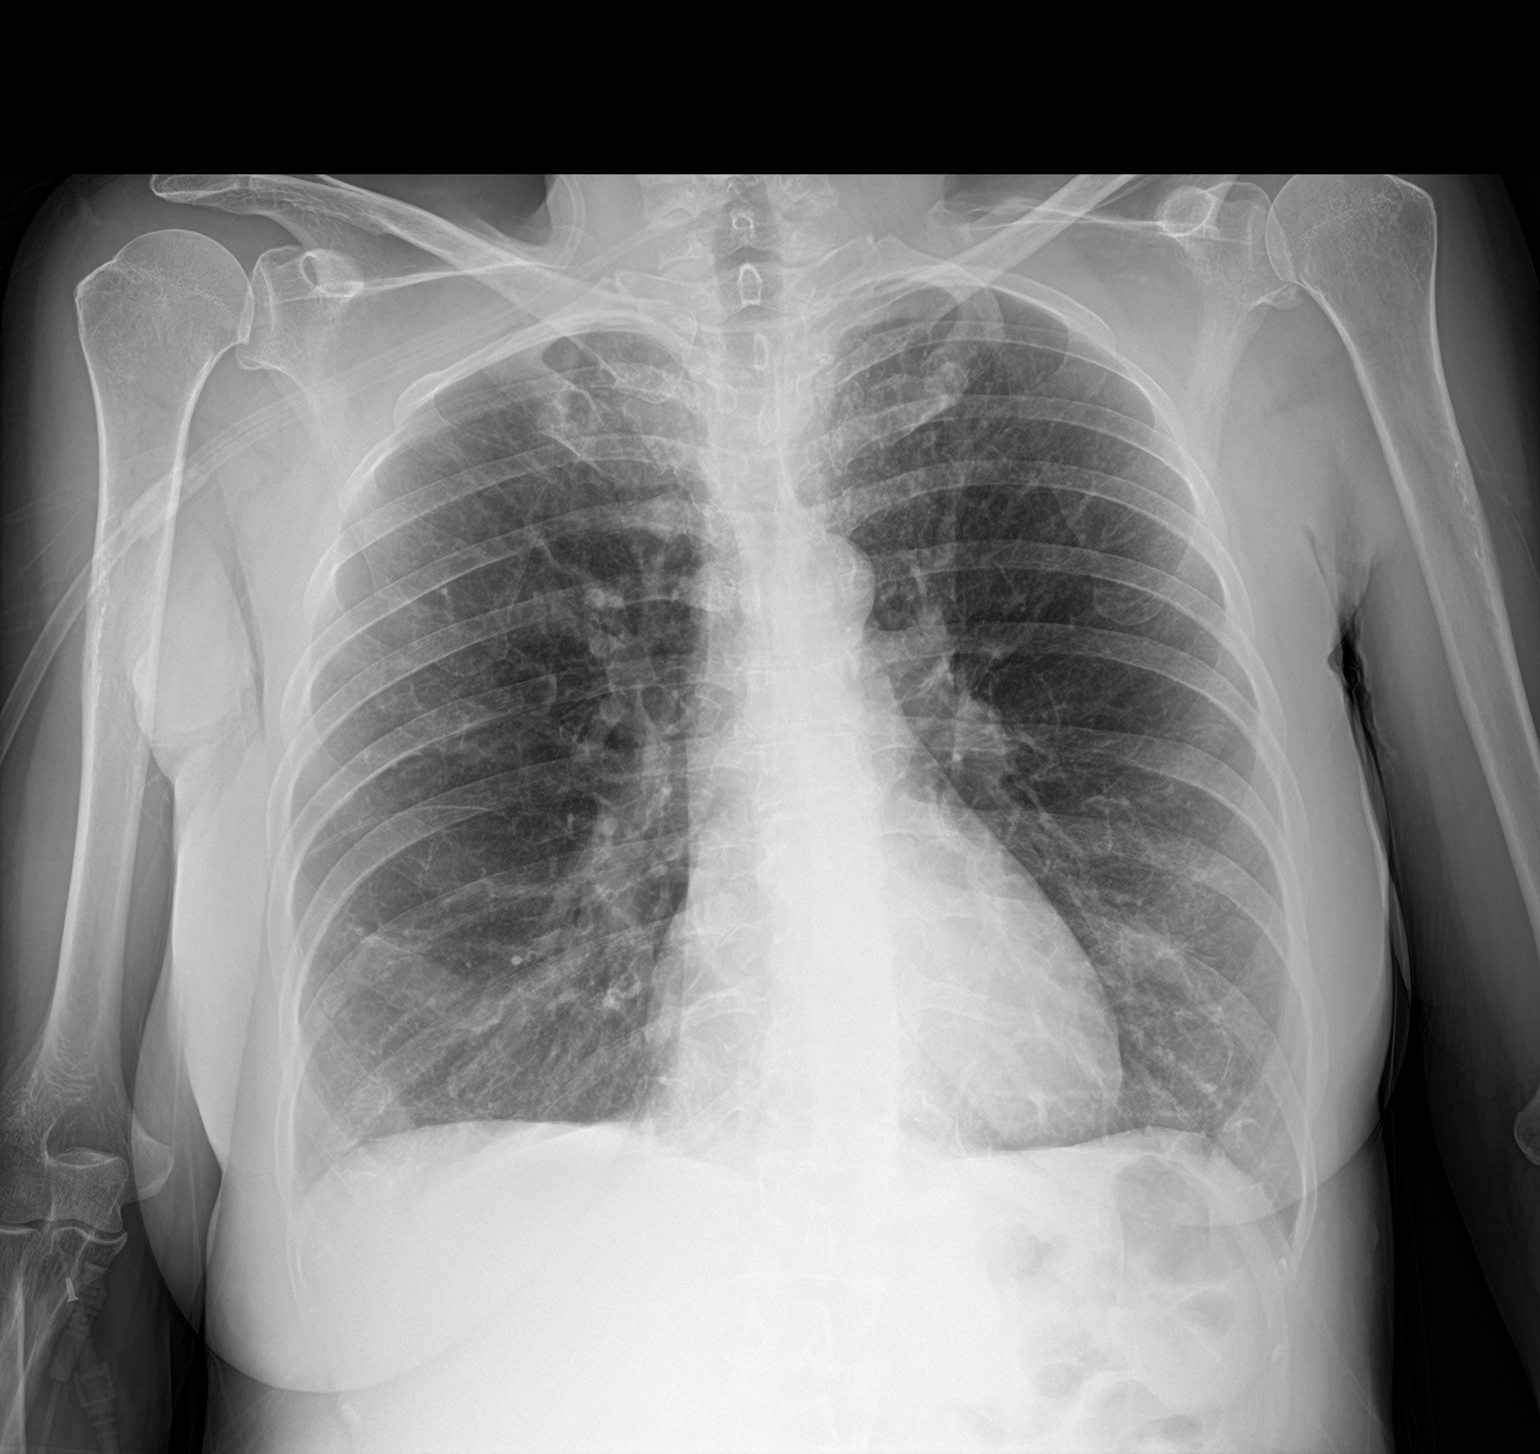

[2 of 2 positions shown; findings below may reference images not displayed]

FINDINGS: Cardiac size is within normal limits. Increase in AP diameter of
chest and flattening of diaphragms suggests COPD. There are no signs
of alveolar pulmonary edema or new focal infiltrates. There is
improvement in aeration of both lower lung fields since [DATE].
There is no significant pleural effusion or pneumothorax.
IMPRESSION: COPD. There are no signs of pulmonary edema or focal pulmonary
consolidation. There is no pleural effusion.

## 2021-10-12 MED ORDER — DEXAMETHASONE SODIUM PHOSPHATE 10 MG/ML IJ SOLN
10.0000 mg | Freq: Once | INTRAMUSCULAR | Status: AC
  Administered 2021-10-12: 10 mg via INTRAVENOUS
  Filled 2021-10-12: qty 1

## 2021-10-12 MED ORDER — ORPHENADRINE CITRATE 30 MG/ML IJ SOLN
60.0000 mg | INTRAMUSCULAR | Status: AC
  Administered 2021-10-12: 60 mg via INTRAVENOUS
  Filled 2021-10-12: qty 2

## 2021-10-12 MED ORDER — SODIUM CHLORIDE 0.9 % IV BOLUS
1000.0000 mL | Freq: Once | INTRAVENOUS | Status: AC
  Administered 2021-10-12: 1000 mL via INTRAVENOUS

## 2021-10-12 MED ORDER — KETOROLAC TROMETHAMINE 30 MG/ML IJ SOLN
30.0000 mg | Freq: Once | INTRAMUSCULAR | Status: AC
  Administered 2021-10-12: 30 mg via INTRAVENOUS
  Filled 2021-10-12: qty 1

## 2021-10-12 NOTE — ED Triage Notes (Signed)
Pt to ED for generalized back pain for the past few days. Pt goes to pain clinic. Gets injections in back. Reports no relief with muscle relaxers Wears O2 as needed at home. 85% on RA in triage, placed on 2 L [place]

## 2021-10-12 NOTE — Discharge Instructions (Addendum)
Your exam, labs, chest x-ray overall reassuring at this time.  You have received IV pain medicine as well as IV fluid bolus here in the ED.  You should continue with your home medicines and follow-up with your primary provider and your pain medicine specialist for ongoing symptom relief.  Return to the ED if necessary.

## 2021-10-12 NOTE — ED Notes (Signed)
Pt declined DC vitals

## 2021-10-12 NOTE — ED Provider Notes (Signed)
[facility] Emergency Department Provider Note ____________________________________________  Time seen: 1659  I have reviewed the triage vital signs and the nursing notes.  HISTORY  Chief Complaint  Back Pain   HPI Colleen [name] is a 60 y.o. female with the below medical history, including a recent diagnosis of community-acquired pneumonia, presents to the ED with acute on chronic pain.  Patient would endorse pain from the base of the neck, down the spine to the tailbone.  She denies any recent injury, trauma, or fall.  She denies any fever, chills, sweats, chest pain, shortness of breath.  She would endorse that her symptoms related to the pneumonia have been improved since she completed antibiotic course.  She describes pain from the bilateral hips down to the legs.  And she describes sensation of cold ankles and feet.  She has been taking her home medicines as prescribed by her pain management specialist.  She reports limited benefit with recently added tizanidine.  No bladder or bowel incontinence, foot drop, or saddle anesthesias reported.  Past Medical History:  Diagnosis Date   Anxiety    Asthma    Breast cancer ([place]) 2004   left breast   Chronic back pain    COPD (chronic obstructive pulmonary disease) ([place])    Depression    Personal history of radiation therapy     Patient Active Problem List   Diagnosis Date Noted   MDD (major depressive disorder), recurrent, in full remission ([place]) 07/29/2021   Tobacco use disorder 06/19/2021   Insomnia due to medical condition 03/21/2021   MDD (major depressive disorder), recurrent episode, moderate ([place]) 03/04/2021   PTSD (post-traumatic stress disorder) 03/04/2021   Myofascial pain syndrome 07/09/2020   Lumbar radiculopathy 07/09/2020   Chronic pain syndrome 06/04/2020   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, L5/S1) 06/04/2020   Lumbar facet arthropathy (L4,5 S1) 06/04/2020   Cervical facet joint syndrome 06/04/2020    Osteopenia of multiple sites 06/04/2020   Age related osteoporosis 06/04/2020   Low back pain 04/27/2020   COPD exacerbation ([place]) 03/20/2020   Asthma, chronic, unspecified asthma severity, with acute exacerbation 03/20/2020   Panic attack 03/20/2020   Acute on chronic respiratory failure with hypoxia ([place])    Sepsis ([place]) 10/11/2018   CAP (community acquired pneumonia) 10/11/2018   COPD (chronic obstructive pulmonary disease) ([place]) 10/11/2018   GAD (generalized anxiety disorder) 10/11/2018    Past Surgical History:  Procedure Laterality Date   BREAST BIOPSY Left 2004   positive   BREAST BIOPSY Right    neg   BREAST LUMPECTOMY Left 2004   positive   MASTECTOMY Left    MASTECTOMY     NEPHRECTOMY Left    TUBAL LIGATION      Prior to Admission medications   Medication Sig Start Date End Date Taking? Authorizing Provider  cetirizine-pseudoephedrine (ZYRTEC-D) 5-120 MG tablet Take 1 tablet by mouth 2 (two) times daily as needed for allergies.    [provider]  Cholecalciferol 25 MCG (1000 UT) tablet Take by mouth.    [provider]  denosumab (PROLIA) 60 MG/ML SOSY injection Inject 60 mg into the skin every 6 (six) months.    [provider]  diphenhydrAMINE (BENADRYL) 25 MG tablet Take 1 tablet (25 mg total) by mouth every 6 (six) hours for 3 days. 09/29/21 10/02/21  Carrie Mew, MD  doxycycline (VIBRAMYCIN) 100 MG capsule Take 1 capsule (100 mg total) by mouth 2 (two) times daily. 09/29/21   Carrie Mew, MD  DULoxetine (CYMBALTA) 60 MG capsule Take 1 capsule (60 mg total) by mouth 2 (two) times daily. 07/29/21   Ursula Alert, MD  EPINEPHrine 0.3 mg/0.3 mL IJ SOAJ injection Inject 0.3 mg into the muscle as needed.    [provider]  Fluticasone-Umeclidin-Vilant (TRELEGY ELLIPTA) 100-62.5-25 MCG/INH AEPB Inhale 1 puff into the lungs daily.    [provider]  gabapentin (NEURONTIN) 300 MG capsule Take 1-2 capsules  (300-600 mg total) by mouth as directed. Take 1 capsule daily AM and 2 capsules daily at bedtime 09/23/21   Ursula Alert, MD  guaiFENesin (MUCINEX) 600 MG 12 hr tablet Take 1 tablet (600 mg total) by mouth 2 (two) times daily for 15 days. 09/29/21 10/14/21  Carrie Mew, MD  ipratropium (ATROVENT) 0.02 % nebulizer solution Take by nebulization 4 (four) times daily. 03/26/21   [provider]  montelukast (SINGULAIR) 10 MG tablet Take 10 mg by mouth at bedtime.    [provider]  predniSONE (STERAPRED UNI-PAK 21 TAB) 10 MG (21) TBPK tablet 6 tablets on day 1, then 5 tablets on day 2, then 4 tablets on day 3, then 3 tablets on day 4, then 2 tablets on day 5, then 1 tablet on day 6. 09/29/21   Carrie Mew, MD  QUEtiapine (SEROQUEL) 25 MG tablet Take 0.5-1 tablets (12.5-25 mg total) by mouth at bedtime. 07/29/21   Ursula Alert, MD  tiZANidine (ZANAFLEX) 4 MG tablet Take 1 tablet (4 mg total) by mouth every 12 (twelve) hours as needed for muscle spasms. 10/09/21 11/08/21  Gillis Santa, MD  VENTOLIN HFA 108 (90 Base) MCG/ACT inhaler Inhale 1-2 puffs into the lungs every 4 (four) hours as needed for cough or wheezing. 10/02/18   [provider]    Allergies Desvenlafaxine, Morphine and related, Penicillins, Prazosin, Tamoxifen, Trazodone, Clonazepam, Codeine, Hydroxyzine, Lorazepam, Paroxetine hcl, Sulfa antibiotics, and Cephalexin  Family History  Problem Relation Age of Onset   Breast cancer Cousin    Breast cancer Cousin    Bipolar disorder Daughter    Drug abuse Daughter    Alcohol abuse Maternal Grandfather     Social History Social History   Tobacco Use   Smoking status: Every Day    Packs/day: 0.50    Types: Cigarettes   Smokeless tobacco: Never  Vaping Use   Vaping Use: Never used  Substance Use Topics   Alcohol use: Yes   Drug use: Never    Review of Systems  Constitutional: Negative for fever. Eyes: Negative for visual  changes. ENT: Negative for sore throat. Cardiovascular: Negative for chest pain. Respiratory: Negative for shortness of breath. Gastrointestinal: Negative for abdominal pain, vomiting and diarrhea. Genitourinary: Negative for dysuria. Musculoskeletal: Positive for back pain. Skin: Negative for rash. Neurological: Negative for headaches, focal weakness or numbness.  Reports cold sensation to the legs as well as bilateral hip and thigh paresthesias ____________________________________________  PHYSICAL EXAM:  VITAL SIGNS: ED Triage Vitals  Enc Vitals Group     BP 10/12/21 1554 (!) 146/108     Pulse Rate 10/12/21 1554 100     Resp 10/12/21 1552 20     Temp 10/12/21 1552 98 F (36.7 C)     Temp Source 10/12/21 1552 Oral     SpO2 10/12/21 1554 92 %     Weight 10/12/21 1553 135 lb (61.2 kg)     Height 10/12/21 1553 5' (1.524 m)     Head Circumference --      Peak Flow --  Pain Score 10/12/21 1553 10     Pain Loc --      Pain Edu? --      Excl. in [place]? --     Constitutional: Alert and oriented. Well appearing and in no distress. Head: Normocephalic and atraumatic. Eyes: Conjunctivae are normal. Normal extraocular movements Neck: Supple. Normal ROM.  Cardiovascular: Normal rate, regular rhythm. Normal distal pulses. Respiratory: Normal respiratory effort. No wheezes/rales/rhonchi. Gastrointestinal: Soft and nontender. No distention. Musculoskeletal: Nontender with normal range of motion in all extremities.  Normal hip flexion and extension range on exam. Neurologic: Cranial nerves II to XII grossly intact.  Normal LE DTRs bilaterally.  Normal gait without ataxia. Normal speech and language. No gross focal neurologic deficits are appreciated. Skin:  Skin is warm, dry and intact. No rash noted. Psychiatric: Mood and affect are normal. Patient exhibits appropriate insight and judgment. ____________________________________________    {LABS (pertinent positives/negatives)  Labs  Reviewed  BASIC METABOLIC PANEL - Abnormal; Notable for the following components:      Result Value   Sodium 130 (*)    Chloride 94 (*)    BUN 6 (*)    Creatinine, Ser 0.41 (*)    Calcium 8.8 (*)    All other components within normal limits  CBC WITH DIFFERENTIAL/PLATELET  URINALYSIS, COMPLETE (UACMP) WITH MICROSCOPIC  ____________________________________________  {EKG  ____________________________________________   RADIOLOGY Official radiology report(s): DG Chest 2 View  Result Date: 10/12/2021 CLINICAL DATA:  Chest pain, back pain EXAM: CHEST - 2 VIEW COMPARISON:  Previous studies including the examination of 09/29/2021 FINDINGS: Cardiac size is within normal limits. Increase in AP diameter of chest and flattening of diaphragms suggests COPD. There are no signs of alveolar pulmonary edema or new focal infiltrates. There is improvement in aeration of both lower lung fields since 09/29/2021. There is no significant pleural effusion or pneumothorax. IMPRESSION: COPD. There are no signs of pulmonary edema or focal pulmonary consolidation. There is no pleural effusion. Electronically Signed   By: Elmer Picker M.D.   On: 10/12/2021 17:17   ____________________________________________  PROCEDURES  NS 1000 ml bolus IVP Toradol 30 mg IVP Norflex 60 mg IVP Decadron 10 mg IVP  Procedures ____________________________________________   INITIAL IMPRESSION / ASSESSMENT AND PLAN / ED COURSE  As part of my medical decision making, I reviewed the following data within the [place] reviewed WNL, Radiograph reviewed NAD, Notes from prior ED visits, and [place] Controlled Substance Database   DDX: CAP, COPD exacerbation, acute flare on chronic LBP  Patient with acute on chronic flare of her low back pain with distal paresthesias.  Patient presents in no acute distress.  Recently diagnosed treated for community-acquired pneumonia.  She denies any current symptoms  related to her respiratory history or recent acute infection.  She is stable at 2 L of O2 which is her baseline.  Her exam is reassuring as it shows no red flags on exam.  No trauma to give indication for an acute compression fracture or HNP.  No radicular symptoms above baseline.  Patient is reporting limited pain relief with her home medications.  She has significant allergies to most opioids, is currently being managed with Cymbalta, muscle relaxants, and gabapentin.  Patient reports improvement of her symptoms after ED medication administration.  She will be discharged with instructions to continue with her home meds as prescribed, and follow-up with her PCP and pain medicine specialist.  Return precautions have been reviewed.  Colleen [name] was evaluated  in Emergency Department on 10/12/2021 for the symptoms described in the history of present illness. She was evaluated in the context of the global COVID-19 pandemic, which necessitated consideration that the patient might be at risk for infection with the SARS-CoV-2 virus that causes COVID-19. Institutional protocols and algorithms that pertain to the evaluation of patients at risk for COVID-19 are in a state of rapid change based on information released by regulatory bodies including the CDC and federal and state organizations. These policies and algorithms were followed during the patient's care in the ED. ____________________________________________  FINAL CLINICAL IMPRESSION(S) / ED DIAGNOSES  Final diagnoses:  Chronic low back pain without sciatica, unspecified back pain laterality      Melvenia Needles, PA-C 10/12/21 1942    Vladimir Crofts, MD 10/13/21 [phone]

## 2021-10-13 ENCOUNTER — Encounter: Payer: Self-pay | Admitting: Student in an Organized Health Care Education/Training Program

## 2021-10-14 ENCOUNTER — Telehealth: Payer: Self-pay

## 2021-10-14 NOTE — Telephone Encounter (Signed)
Called patient and instructed her that it should be fine to come in 12/7 for procedure.

## 2021-10-14 NOTE — Telephone Encounter (Signed)
Was seen in the ED and received an steriod inj wants to know if she needs to cancel her upcoming appt

## 2021-10-16 ENCOUNTER — Ambulatory Visit: Admitting: Student in an Organized Health Care Education/Training Program

## 2021-10-19 ENCOUNTER — Encounter: Payer: Self-pay | Admitting: Emergency Medicine

## 2021-10-19 ENCOUNTER — Inpatient Hospital Stay
Admission: EM | Admit: 2021-10-19 | Discharge: 2021-10-22 | DRG: 190 | Disposition: A | Discharge status: Home / Self Care | Attending: Internal Medicine | Admitting: Internal Medicine

## 2021-10-19 ENCOUNTER — Emergency Department

## 2021-10-19 ENCOUNTER — Other Ambulatory Visit: Payer: Self-pay

## 2021-10-19 DIAGNOSIS — Z79899 Other long term (current) drug therapy: Secondary | ICD-10-CM | POA: Diagnosis not present

## 2021-10-19 DIAGNOSIS — Z853 Personal history of malignant neoplasm of breast: Secondary | ICD-10-CM | POA: Diagnosis not present

## 2021-10-19 DIAGNOSIS — Z881 Allergy status to other antibiotic agents status: Secondary | ICD-10-CM

## 2021-10-19 DIAGNOSIS — Z905 Acquired absence of kidney: Secondary | ICD-10-CM | POA: Diagnosis not present

## 2021-10-19 DIAGNOSIS — Z803 Family history of malignant neoplasm of breast: Secondary | ICD-10-CM

## 2021-10-19 DIAGNOSIS — F331 Major depressive disorder, recurrent, moderate: Secondary | ICD-10-CM | POA: Diagnosis not present

## 2021-10-19 DIAGNOSIS — Z7951 Long term (current) use of inhaled steroids: Secondary | ICD-10-CM | POA: Diagnosis not present

## 2021-10-19 DIAGNOSIS — E871 Hypo-osmolality and hyponatremia: Secondary | ICD-10-CM | POA: Diagnosis present

## 2021-10-19 DIAGNOSIS — F411 Generalized anxiety disorder: Secondary | ICD-10-CM | POA: Diagnosis not present

## 2021-10-19 DIAGNOSIS — Z86718 Personal history of other venous thrombosis and embolism: Secondary | ICD-10-CM | POA: Diagnosis not present

## 2021-10-19 DIAGNOSIS — J9621 Acute and chronic respiratory failure with hypoxia: Secondary | ICD-10-CM | POA: Diagnosis present

## 2021-10-19 DIAGNOSIS — Z885 Allergy status to narcotic agent status: Secondary | ICD-10-CM | POA: Diagnosis not present

## 2021-10-19 DIAGNOSIS — Z882 Allergy status to sulfonamides status: Secondary | ICD-10-CM | POA: Diagnosis not present

## 2021-10-19 DIAGNOSIS — Z9012 Acquired absence of left breast and nipple: Secondary | ICD-10-CM

## 2021-10-19 DIAGNOSIS — F3342 Major depressive disorder, recurrent, in full remission: Secondary | ICD-10-CM

## 2021-10-19 DIAGNOSIS — F4310 Post-traumatic stress disorder, unspecified: Secondary | ICD-10-CM

## 2021-10-19 DIAGNOSIS — M545 Low back pain, unspecified: Secondary | ICD-10-CM | POA: Diagnosis not present

## 2021-10-19 DIAGNOSIS — E869 Volume depletion, unspecified: Secondary | ICD-10-CM | POA: Diagnosis not present

## 2021-10-19 DIAGNOSIS — Z923 Personal history of irradiation: Secondary | ICD-10-CM

## 2021-10-19 DIAGNOSIS — Z888 Allergy status to other drugs, medicaments and biological substances status: Secondary | ICD-10-CM | POA: Diagnosis not present

## 2021-10-19 DIAGNOSIS — F17210 Nicotine dependence, cigarettes, uncomplicated: Secondary | ICD-10-CM | POA: Diagnosis present

## 2021-10-19 DIAGNOSIS — Z880 Allergy status to penicillin: Secondary | ICD-10-CM

## 2021-10-19 DIAGNOSIS — J441 Chronic obstructive pulmonary disease with (acute) exacerbation: Principal | ICD-10-CM | POA: Diagnosis present

## 2021-10-19 DIAGNOSIS — Z20822 Contact with and (suspected) exposure to covid-19: Secondary | ICD-10-CM | POA: Diagnosis present

## 2021-10-19 DIAGNOSIS — G894 Chronic pain syndrome: Secondary | ICD-10-CM | POA: Diagnosis present

## 2021-10-19 LAB — CBC WITH DIFFERENTIAL/PLATELET
Abs Immature Granulocytes: 0.01 10*3/uL (ref 0.00–0.07)
Basophils Absolute: 0.1 10*3/uL (ref 0.0–0.1)
Basophils Relative: 1 %
Eosinophils Absolute: 0.1 10*3/uL (ref 0.0–0.5)
Eosinophils Relative: 2 %
HCT: 42.1 % (ref 36.0–46.0)
Hemoglobin: 14 g/dL (ref 12.0–15.0)
Immature Granulocytes: 0 %
Lymphocytes Relative: 27 %
Lymphs Abs: 1.5 10*3/uL (ref 0.7–4.0)
MCH: 30 pg (ref 26.0–34.0)
MCHC: 33.3 g/dL (ref 30.0–36.0)
MCV: 90.3 fL (ref 80.0–100.0)
Monocytes Absolute: 0.6 10*3/uL (ref 0.1–1.0)
Monocytes Relative: 10 %
Neutro Abs: 3.4 10*3/uL (ref 1.7–7.7)
Neutrophils Relative %: 60 %
Platelets: 301 10*3/uL (ref 150–400)
RBC: 4.66 MIL/uL (ref 3.87–5.11)
RDW: 13 % (ref 11.5–15.5)
WBC: 5.8 10*3/uL (ref 4.0–10.5)
nRBC: 0 % (ref 0.0–0.2)

## 2021-10-19 LAB — TROPONIN I (HIGH SENSITIVITY)
Troponin I (High Sensitivity): 6 ng/L (ref <18)
Troponin I (High Sensitivity): 4 ng/L (ref <18)

## 2021-10-19 LAB — COMPREHENSIVE METABOLIC PANEL
ALT: 11 U/L (ref 0–44)
AST: 15 U/L (ref 15–41)
Albumin: 4.1 g/dL (ref 3.5–5.0)
Alkaline Phosphatase: 37 U/L — ABNORMAL LOW (ref 38–126)
Anion gap: 7 (ref 5–15)
BUN: 5 mg/dL — ABNORMAL LOW (ref 8–23)
CO2: 29 mmol/L (ref 22–32)
Calcium: 8.5 mg/dL — ABNORMAL LOW (ref 8.9–10.3)
Chloride: 91 mmol/L — ABNORMAL LOW (ref 98–111)
Creatinine, Ser: 0.48 mg/dL (ref 0.44–1.00)
GFR, Estimated: >60 mL/min (ref >60)
Glucose, Bld: 102 mg/dL — ABNORMAL HIGH (ref 70–99)
Potassium: 4.1 mmol/L (ref 3.5–5.1)
Sodium: 127 mmol/L — ABNORMAL LOW (ref 135–145)
Total Bilirubin: 0.6 mg/dL (ref 0.3–1.2)
Total Protein: 6.8 g/dL (ref 6.5–8.1)

## 2021-10-19 LAB — RESP PANEL BY RT-PCR (FLU A&B, COVID) ARPGX2
Influenza A by PCR: NEGATIVE
Influenza B by PCR: NEGATIVE
SARS Coronavirus 2 by RT PCR: NEGATIVE

## 2021-10-19 LAB — BRAIN NATRIURETIC PEPTIDE: B Natriuretic Peptide: 9.3 pg/mL (ref 0.0–100.0)

## 2021-10-19 LAB — PROCALCITONIN: Procalcitonin: <0.1 ng/mL

## 2021-10-19 LAB — OSMOLALITY: Osmolality: 273 mOsm/kg — ABNORMAL LOW (ref 275–295)

## 2021-10-19 IMAGING — DX DG CHEST 1V PORT
1 series · 1 of 1 positions shown · non-contrast
Comparison: Chest x-ray dated [DATE]; CT chest dated [DATE]

CLINICAL DATA: Shortness of breath

EXAM:
PORTABLE CHEST 1 VIEW

[chest ap]
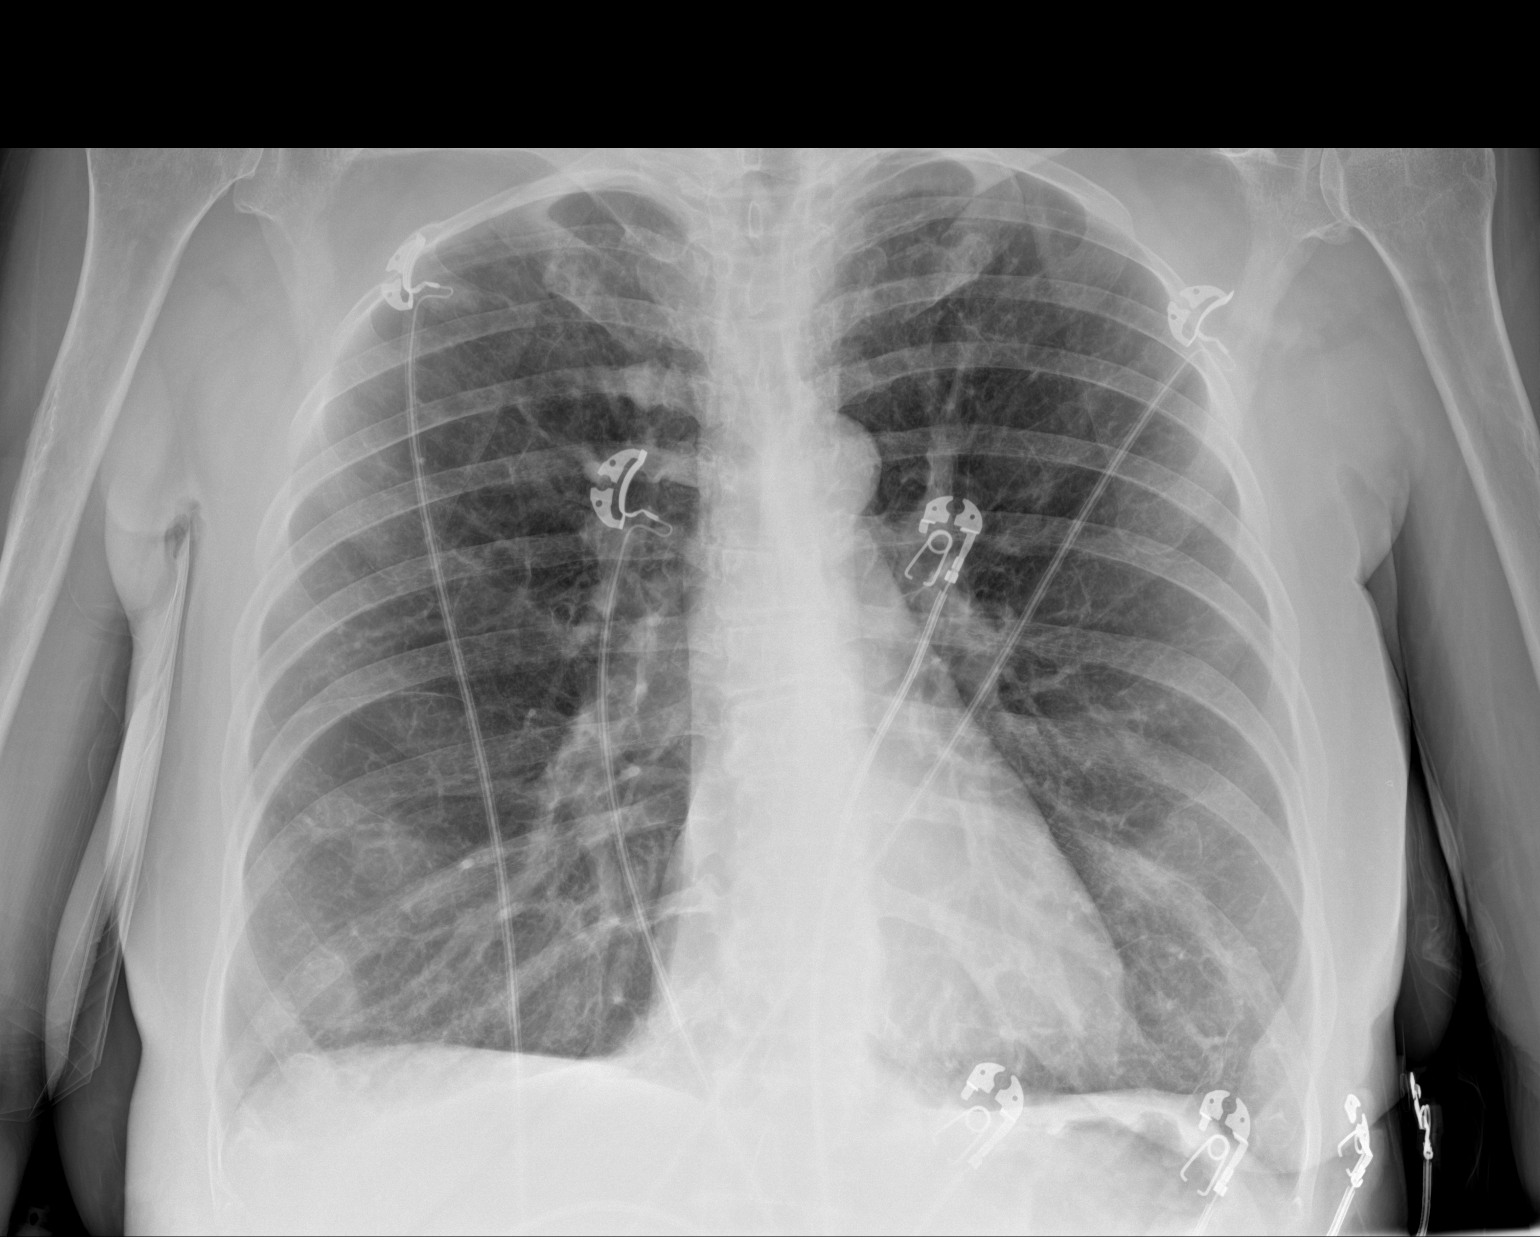

[1 of 1 positions shown; findings below may reference images not displayed]

FINDINGS: Cardiac and mediastinal contours are within normal limits. Mild
lingular opacity favored to be due to scarring when compared with
most recent prior CT. No new parenchymal opacity. No evidence of
pleural effusion or pneumothorax.
IMPRESSION: No active disease.

## 2021-10-19 IMAGING — CT CT ANGIO CHEST
2 of 7 series · 19 of 46 positions shown · IV contrast (APPLIED)
Comparison: CT [DATE]

CLINICAL DATA: Pulmonary embolism.

EXAM:
CT ANGIOGRAPHY CHEST WITH CONTRAST
TECHNIQUE: Multidetector CT imaging of the chest was performed using the
standard protocol during bolus administration of intravenous
contrast. Multiplanar CT image reconstructions and MIPs were
obtained to evaluate the vascular anatomy.
CONTRAST:  75mL OMNIPAQUE IOHEXOL 350 MG/ML SOLN

[Series 5: thins · axial · 0.70mm/px · z∈[-558,-289]mm · 16 of 374 slices shown]
[im 19/374  lung]
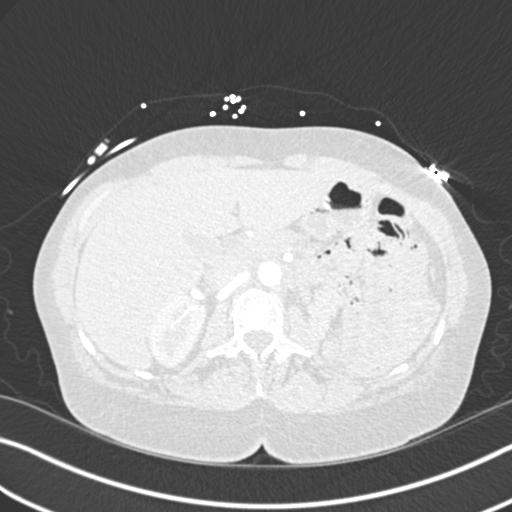
[im 38/374  soft-tissue]
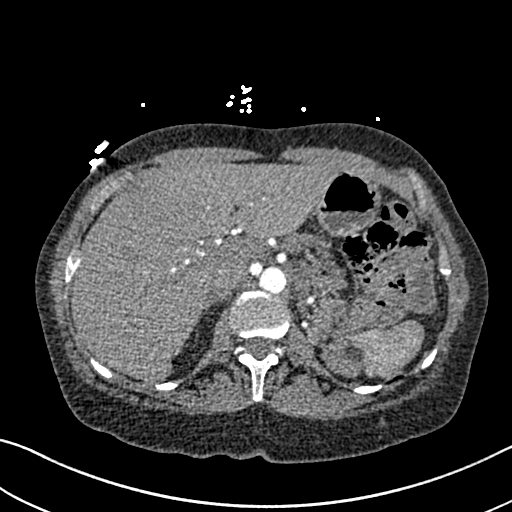
[im 56/374  lung]
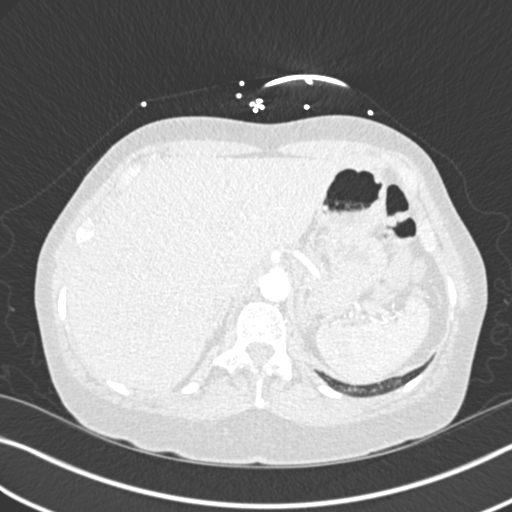
[im 94/374  soft-tissue]
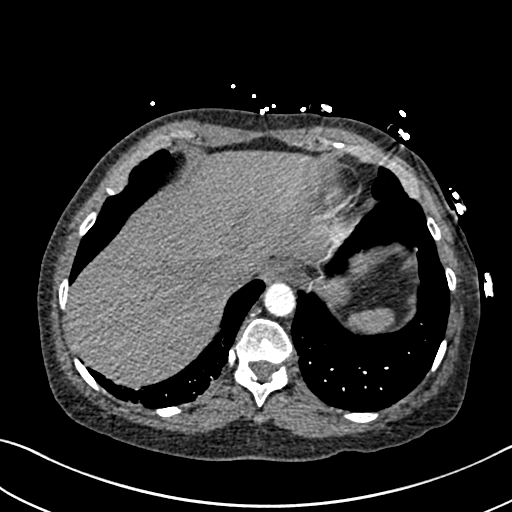
[im 112/374  lung]
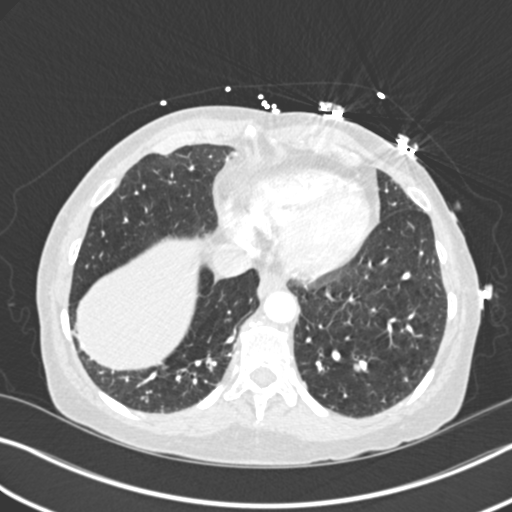
[im 131/374  soft-tissue]
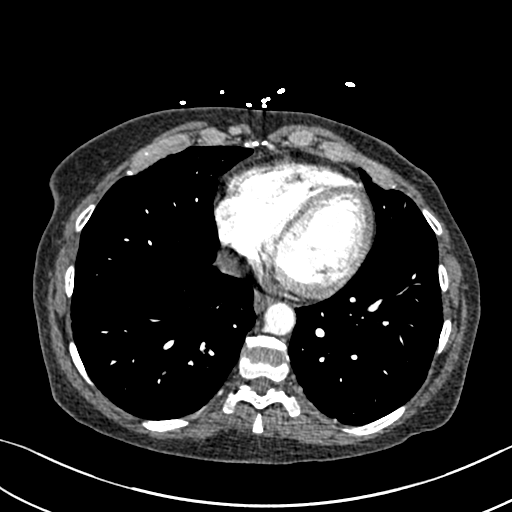
[im 150/374  lung]
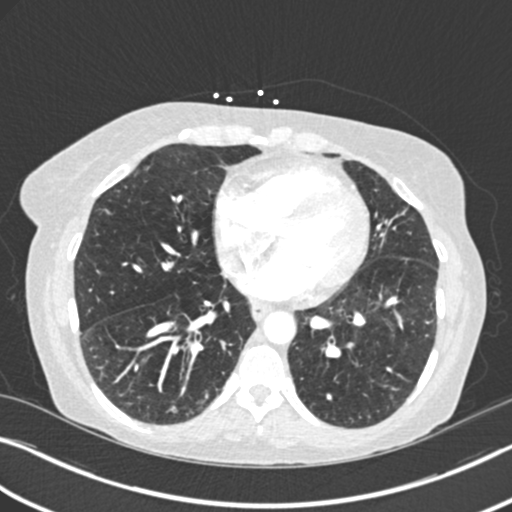
[im 168/374  soft-tissue]
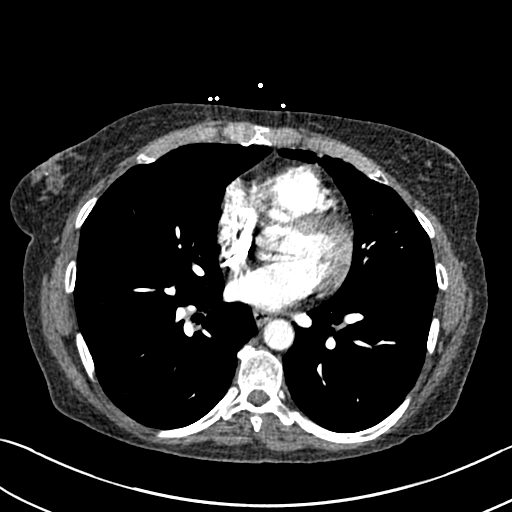
[im 206/374  lung]
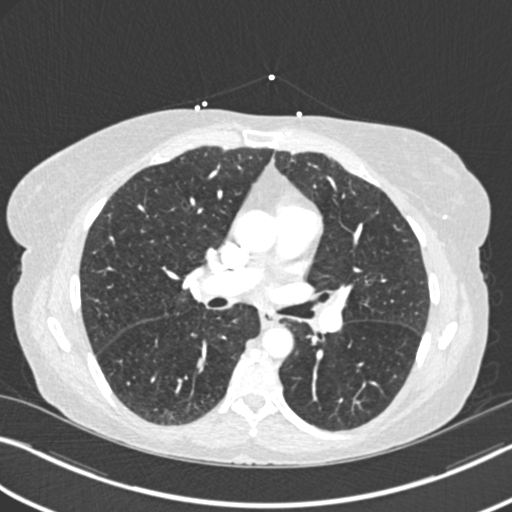
[im 224/374  soft-tissue]
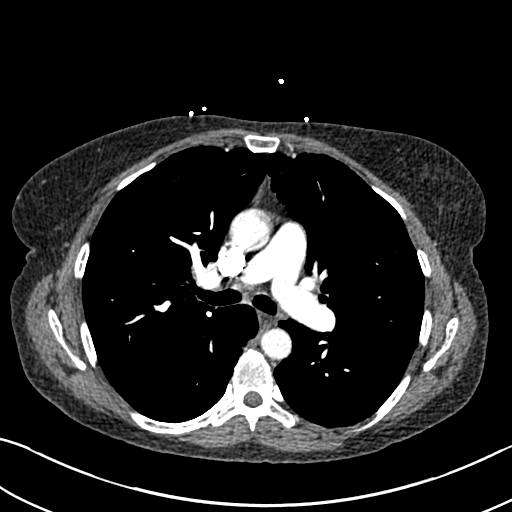
[im 243/374  lung]
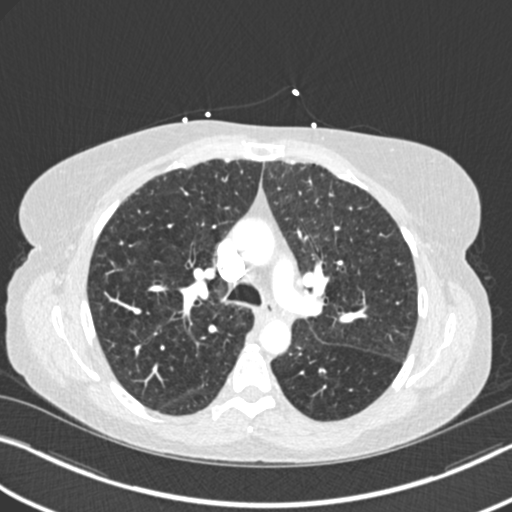
[im 262/374  soft-tissue]
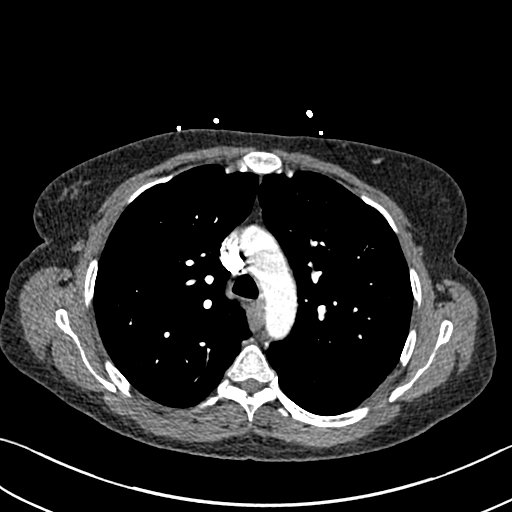
[im 280/374  lung]
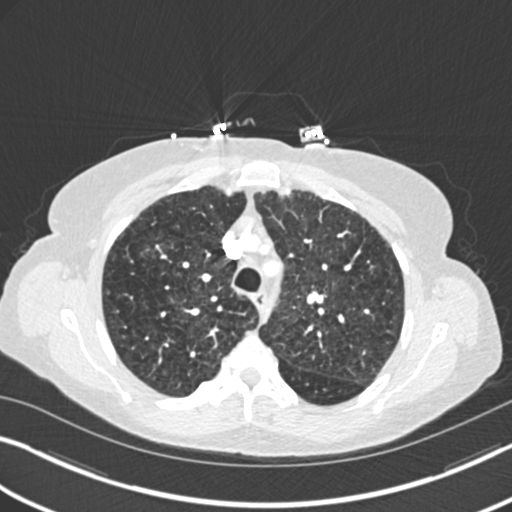
[im 318/374  soft-tissue]
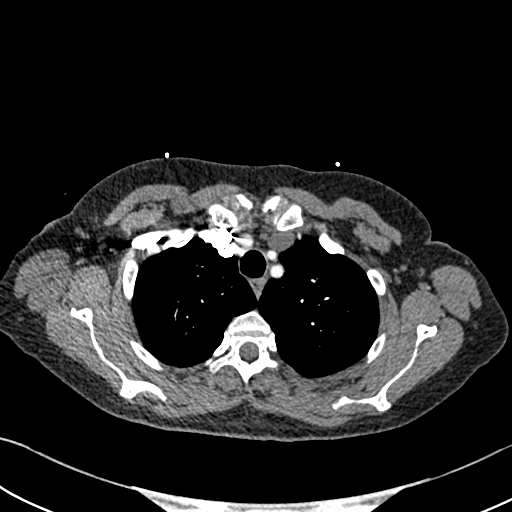
[im 336/374  lung]
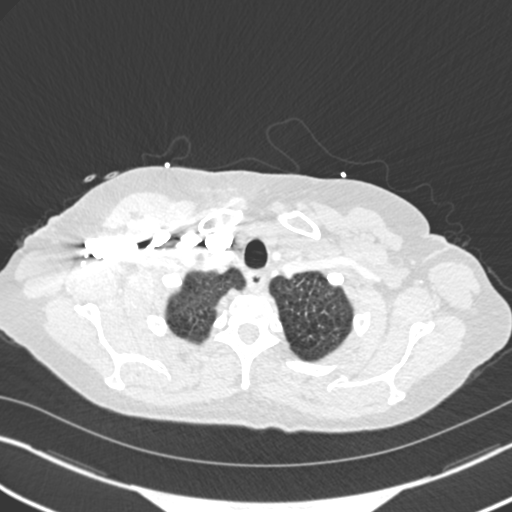
[im 355/374  soft-tissue]
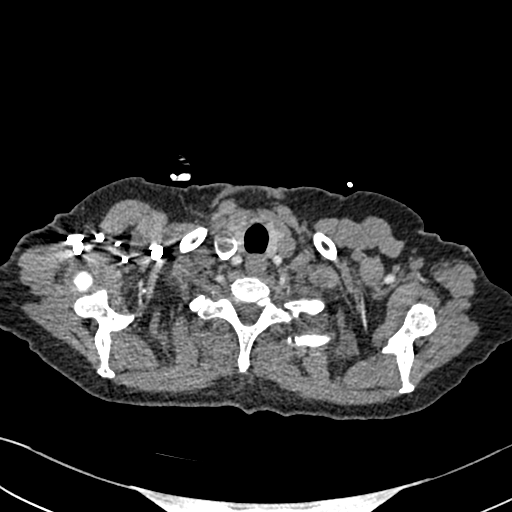

[Series 7: coronal mpr · coronal · 0.58mm/px · 3 of 90 slices shown]
[im 23/90  soft-tissue]
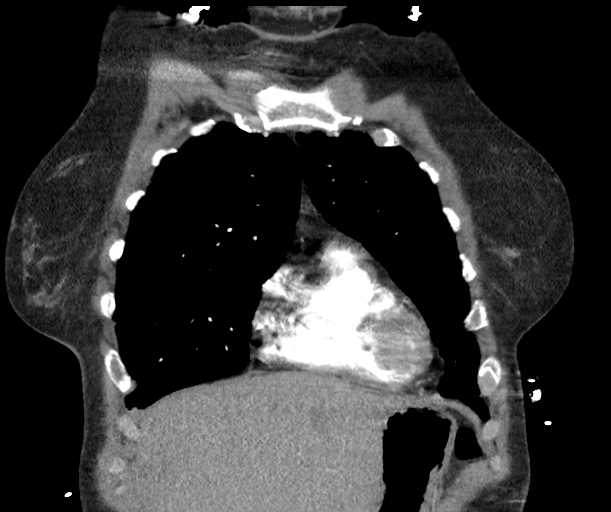
[im 45/90  soft-tissue]
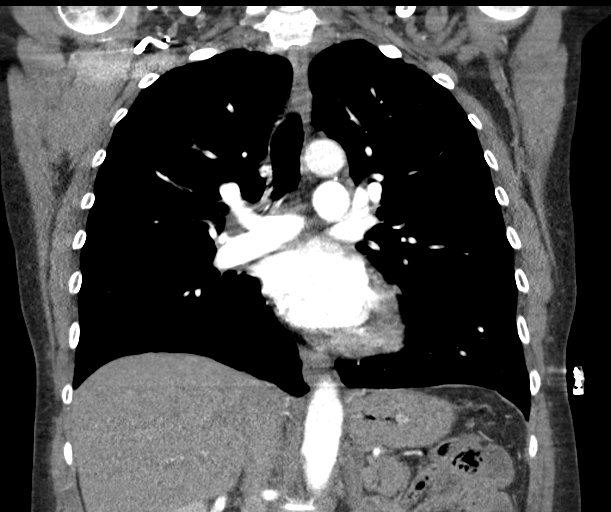
[im 67/90  soft-tissue]
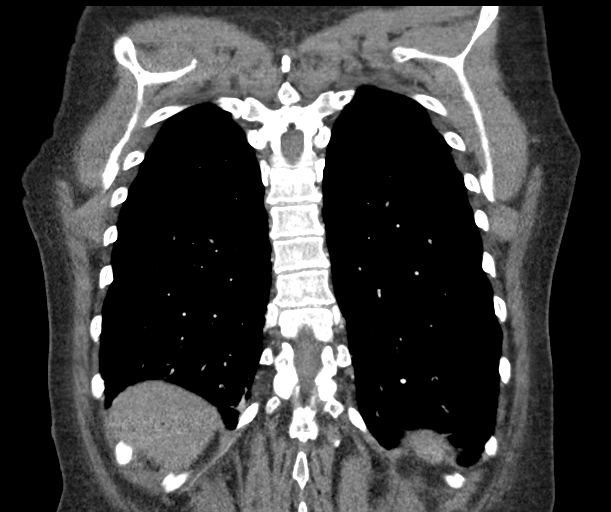

[19 of 46 positions shown; findings below may reference images not displayed]

FINDINGS: Cardiovascular: No filling defects within the pulmonary arteries to
suggest acute pulmonary embolism. No significant vascular findings.
Normal heart size. No pericardial effusion.

Mediastinum/Nodes: No axillary or supraclavicular adenopathy. No
mediastinal or hilar adenopathy. No pericardial fluid. Esophagus
normal.

Lungs/Pleura: No suspicious pulmonary nodules. Normal pleural.
Airways normal.

Upper Abdomen: Limited view of the liver, kidneys, pancreas are
unremarkable. Normal adrenal glands.

Musculoskeletal: No aggressive osseous lesion.

1. No acute pulmonary embolism.
2. No acute pulmonary parenchymal findings.

Review of the MIP images confirms the above findings.

## 2021-10-19 MED ORDER — GABAPENTIN 300 MG PO CAPS
300.0000 mg | ORAL_CAPSULE | Freq: Every morning | ORAL | Status: DC
  Administered 2021-10-20 – 2021-10-22 (×3): 300 mg via ORAL
  Filled 2021-10-19 (×3): qty 1

## 2021-10-19 MED ORDER — UMECLIDINIUM BROMIDE 62.5 MCG/ACT IN AEPB
1.0000 | INHALATION_SPRAY | Freq: Every day | RESPIRATORY_TRACT | Status: DC
  Administered 2021-10-19 – 2021-10-22 (×3): 1 via RESPIRATORY_TRACT
  Filled 2021-10-19 (×2): qty 7

## 2021-10-19 MED ORDER — ENOXAPARIN SODIUM 40 MG/0.4ML IJ SOSY
40.0000 mg | PREFILLED_SYRINGE | INTRAMUSCULAR | Status: DC
  Administered 2021-10-19 – 2021-10-21 (×3): 40 mg via SUBCUTANEOUS
  Filled 2021-10-19 (×3): qty 0.4

## 2021-10-19 MED ORDER — ACETAMINOPHEN 500 MG PO TABS
500.0000 mg | ORAL_TABLET | Freq: Four times a day (QID) | ORAL | Status: DC | PRN
  Administered 2021-10-20 – 2021-10-21 (×2): 500 mg via ORAL
  Filled 2021-10-19 (×2): qty 1

## 2021-10-19 MED ORDER — ONDANSETRON HCL 4 MG/2ML IJ SOLN
4.0000 mg | Freq: Four times a day (QID) | INTRAMUSCULAR | Status: DC | PRN
  Administered 2021-10-20: 4 mg via INTRAVENOUS
  Filled 2021-10-19: qty 2

## 2021-10-19 MED ORDER — PREDNISONE 20 MG PO TABS
40.0000 mg | ORAL_TABLET | Freq: Every day | ORAL | Status: DC
  Administered 2021-10-21 – 2021-10-22 (×2): 40 mg via ORAL
  Filled 2021-10-19 (×2): qty 2

## 2021-10-19 MED ORDER — IPRATROPIUM-ALBUTEROL 0.5-2.5 (3) MG/3ML IN SOLN
3.0000 mL | Freq: Once | RESPIRATORY_TRACT | Status: AC
  Administered 2021-10-19: 3 mL via RESPIRATORY_TRACT
  Filled 2021-10-19: qty 3

## 2021-10-19 MED ORDER — FLUTICASONE FUROATE-VILANTEROL 100-25 MCG/ACT IN AEPB
1.0000 | INHALATION_SPRAY | Freq: Every day | RESPIRATORY_TRACT | Status: DC
  Administered 2021-10-19 – 2021-10-22 (×3): 1 via RESPIRATORY_TRACT
  Filled 2021-10-19 (×2): qty 28

## 2021-10-19 MED ORDER — GABAPENTIN 300 MG PO CAPS
600.0000 mg | ORAL_CAPSULE | Freq: Every day | ORAL | Status: DC
  Administered 2021-10-19 – 2021-10-21 (×2): 600 mg via ORAL
  Filled 2021-10-19 (×3): qty 2

## 2021-10-19 MED ORDER — IPRATROPIUM-ALBUTEROL 0.5-2.5 (3) MG/3ML IN SOLN
3.0000 mL | Freq: Four times a day (QID) | RESPIRATORY_TRACT | Status: DC | PRN
  Administered 2021-10-20: 3 mL via RESPIRATORY_TRACT
  Filled 2021-10-19: qty 3

## 2021-10-19 MED ORDER — TIZANIDINE HCL 4 MG PO TABS
4.0000 mg | ORAL_TABLET | Freq: Two times a day (BID) | ORAL | Status: DC | PRN
  Administered 2021-10-20 – 2021-10-21 (×2): 4 mg via ORAL
  Filled 2021-10-19 (×4): qty 1

## 2021-10-19 MED ORDER — SODIUM CHLORIDE 0.9 % IV SOLN: INTRAVENOUS | Status: AC

## 2021-10-19 MED ORDER — SODIUM CHLORIDE 0.9 % IV BOLUS
500.0000 mL | Freq: Once | INTRAVENOUS | Status: AC
  Administered 2021-10-19: 500 mL via INTRAVENOUS

## 2021-10-19 MED ORDER — IOHEXOL 350 MG/ML SOLN
75.0000 mL | Freq: Once | INTRAVENOUS | Status: AC | PRN
  Administered 2021-10-19: 75 mL via INTRAVENOUS

## 2021-10-19 MED ORDER — FLUTICASONE-UMECLIDIN-VILANT 100-62.5-25 MCG/ACT IN AEPB: 1.0000 | INHALATION_SPRAY | Freq: Every day | RESPIRATORY_TRACT | Status: DC

## 2021-10-19 MED ORDER — TIZANIDINE HCL 2 MG PO TABS
4.0000 mg | ORAL_TABLET | Freq: Once | ORAL | Status: AC
  Administered 2021-10-19: 4 mg via ORAL
  Filled 2021-10-19: qty 2

## 2021-10-19 MED ORDER — METHYLPREDNISOLONE SODIUM SUCC 40 MG IJ SOLR
40.0000 mg | Freq: Two times a day (BID) | INTRAMUSCULAR | Status: AC
  Administered 2021-10-19 – 2021-10-20 (×2): 40 mg via INTRAVENOUS
  Filled 2021-10-19 (×2): qty 1

## 2021-10-19 MED ORDER — METHYLPREDNISOLONE SODIUM SUCC 125 MG IJ SOLR
125.0000 mg | Freq: Once | INTRAMUSCULAR | Status: AC
  Administered 2021-10-19: 125 mg via INTRAVENOUS
  Filled 2021-10-19: qty 2

## 2021-10-19 MED ORDER — DULOXETINE HCL 30 MG PO CPEP
60.0000 mg | ORAL_CAPSULE | Freq: Two times a day (BID) | ORAL | Status: DC
  Administered 2021-10-19 – 2021-10-20 (×3): 60 mg via ORAL
  Filled 2021-10-19: qty 2
  Filled 2021-10-19: qty 1
  Filled 2021-10-19 (×3): qty 2

## 2021-10-19 MED ORDER — ONDANSETRON HCL 4 MG PO TABS: 4.0000 mg | ORAL_TABLET | Freq: Four times a day (QID) | ORAL | Status: DC | PRN

## 2021-10-19 MED ORDER — GABAPENTIN 300 MG PO CAPS
600.0000 mg | ORAL_CAPSULE | Freq: Once | ORAL | Status: AC
  Administered 2021-10-19: 600 mg via ORAL
  Filled 2021-10-19: qty 2

## 2021-10-19 MED ORDER — GABAPENTIN 300 MG PO CAPS: 300.0000 mg | ORAL_CAPSULE | ORAL | Status: DC

## 2021-10-19 MED ORDER — LIDOCAINE 5 % EX PTCH
1.0000 | MEDICATED_PATCH | CUTANEOUS | Status: DC
  Administered 2021-10-19 – 2021-10-21 (×3): 1 via TRANSDERMAL
  Filled 2021-10-19 (×3): qty 1

## 2021-10-19 NOTE — ED Triage Notes (Signed)
Patient to ED via [facility] from home in respiratory distress. Patient has a history of COPD and the flu 2 weeks ago. Patient is alert and oriented at this time. Patient chronically wears 3L [place] at home.

## 2021-10-19 NOTE — ED Notes (Signed)
Patient assisted to restroom.  

## 2021-10-19 NOTE — ED Provider Notes (Signed)
[facility] Emergency Department Provider Note  ____________________________________________   Event Date/Time   First MD Initiated Contact with Patient 10/19/21 1122     (approximate)  I have reviewed the triage vital signs and the nursing notes.   HISTORY  Chief Complaint Respiratory Distress    HPI Colleen [name] is a 60 y.o. female with COPD on 3 L of oxygen, prior breast cancer who comes in with concerns for shortness of breath.  Patient reports having acutely worsening shortness of breath starting yesterday/overnight.  When EMS got there she was 88% on nonrebreather.  Patient reported still feeling really short of breath and therefore patient was placed on CPAP due to work of breathing and diminished lung sounds.  Patient got 2 DuoNeb's.  Patient reports shortness of breath is severe, constant, nothing makes it better or worse.  She states "do not let me die".  Denies any abdominal pain, fevers.  Patient did not have the flu 2 weeks ago.  On review of records she was seen on 11/20 and was given treatment for COPD exacerbation with steroids, doxycycline.          Past Medical History:  Diagnosis Date   Anxiety    Asthma    Breast cancer ([place]) 2004   left breast   Chronic back pain    COPD (chronic obstructive pulmonary disease) ([place])    Depression    Personal history of radiation therapy     Patient Active Problem List   Diagnosis Date Noted   MDD (major depressive disorder), recurrent, in full remission ([place]) 07/29/2021   Tobacco use disorder 06/19/2021   Insomnia due to medical condition 03/21/2021   MDD (major depressive disorder), recurrent episode, moderate ([place]) 03/04/2021   PTSD (post-traumatic stress disorder) 03/04/2021   Myofascial pain syndrome 07/09/2020   Lumbar radiculopathy 07/09/2020   Chronic pain syndrome 06/04/2020   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, L5/S1) 06/04/2020   Lumbar facet arthropathy (L4,5 S1) 06/04/2020    Cervical facet joint syndrome 06/04/2020   Osteopenia of multiple sites 06/04/2020   Age related osteoporosis 06/04/2020   Low back pain 04/27/2020   COPD exacerbation ([place]) 03/20/2020   Asthma, chronic, unspecified asthma severity, with acute exacerbation 03/20/2020   Panic attack 03/20/2020   Acute on chronic respiratory failure with hypoxia ([place])    Sepsis ([place]) 10/11/2018   CAP (community acquired pneumonia) 10/11/2018   COPD (chronic obstructive pulmonary disease) ([place]) 10/11/2018   GAD (generalized anxiety disorder) 10/11/2018    Past Surgical History:  Procedure Laterality Date   BREAST BIOPSY Left 2004   positive   BREAST BIOPSY Right    neg   BREAST LUMPECTOMY Left 2004   positive   MASTECTOMY Left    MASTECTOMY     NEPHRECTOMY Left    TUBAL LIGATION      Prior to Admission medications   Medication Sig Start Date End Date Taking? Authorizing Provider  cetirizine-pseudoephedrine (ZYRTEC-D) 5-120 MG tablet Take 1 tablet by mouth 2 (two) times daily as needed for allergies.    [provider]  Cholecalciferol 25 MCG (1000 UT) tablet Take by mouth.    [provider]  denosumab (PROLIA) 60 MG/ML SOSY injection Inject 60 mg into the skin every 6 (six) months.    [provider]  diphenhydrAMINE (BENADRYL) 25 MG tablet Take 1 tablet (25 mg total) by mouth every 6 (six) hours for 3 days. 09/29/21 10/02/21  Carrie Mew, MD  doxycycline (VIBRAMYCIN) 100 MG capsule  Take 1 capsule (100 mg total) by mouth 2 (two) times daily. 09/29/21   Carrie Mew, MD  DULoxetine (CYMBALTA) 60 MG capsule Take 1 capsule (60 mg total) by mouth 2 (two) times daily. 07/29/21   Ursula Alert, MD  EPINEPHrine 0.3 mg/0.3 mL IJ SOAJ injection Inject 0.3 mg into the muscle as needed.    [provider]  Fluticasone-Umeclidin-Vilant (TRELEGY ELLIPTA) 100-62.5-25 MCG/INH AEPB Inhale 1 puff into the lungs daily.    [provider]  gabapentin  (NEURONTIN) 300 MG capsule Take 1-2 capsules (300-600 mg total) by mouth as directed. Take 1 capsule daily AM and 2 capsules daily at bedtime 09/23/21   Ursula Alert, MD  ipratropium (ATROVENT) 0.02 % nebulizer solution Take by nebulization 4 (four) times daily. 03/26/21   [provider]  montelukast (SINGULAIR) 10 MG tablet Take 10 mg by mouth at bedtime.    [provider]  predniSONE (STERAPRED UNI-PAK 21 TAB) 10 MG (21) TBPK tablet 6 tablets on day 1, then 5 tablets on day 2, then 4 tablets on day 3, then 3 tablets on day 4, then 2 tablets on day 5, then 1 tablet on day 6. 09/29/21   Carrie Mew, MD  QUEtiapine (SEROQUEL) 25 MG tablet Take 0.5-1 tablets (12.5-25 mg total) by mouth at bedtime. 07/29/21   Ursula Alert, MD  tiZANidine (ZANAFLEX) 4 MG tablet Take 1 tablet (4 mg total) by mouth every 12 (twelve) hours as needed for muscle spasms. 10/09/21 11/08/21  Gillis Santa, MD  VENTOLIN HFA 108 (90 Base) MCG/ACT inhaler Inhale 1-2 puffs into the lungs every 4 (four) hours as needed for cough or wheezing. 10/02/18   [provider]    Allergies Desvenlafaxine, Morphine and related, Penicillins, Prazosin, Tamoxifen, Trazodone, Clonazepam, Codeine, Hydroxyzine, Lorazepam, Paroxetine hcl, Sulfa antibiotics, and Cephalexin  Family History  Problem Relation Age of Onset   Breast cancer Cousin    Breast cancer Cousin    Bipolar disorder Daughter    Drug abuse Daughter    Alcohol abuse Maternal Grandfather     Social History Social History   Tobacco Use   Smoking status: Every Day    Packs/day: 0.50    Types: Cigarettes   Smokeless tobacco: Never  Vaping Use   Vaping Use: Never used  Substance Use Topics   Alcohol use: Yes   Drug use: Never      Review of Systems Constitutional: No fever/chills Eyes: No visual changes. ENT: No sore throat. Cardiovascular: No chest pain Respiratory: Positive for SOB Gastrointestinal: No abdominal pain.  No  nausea, no vomiting.  No diarrhea.  No constipation. Genitourinary: Negative for dysuria. Musculoskeletal: Negative for back pain. Skin: Negative for rash. Neurological: Negative for headaches, focal weakness or numbness. All other ROS negative ____________________________________________   PHYSICAL EXAM:  VITAL SIGNS: ED Triage Vitals  Enc Vitals Group     BP --      Pulse --      Resp --      Temp --      Temp src --      SpO2 10/19/21 1117 100 %     Weight 10/19/21 1121 142 lb 3.2 oz (64.5 kg)     Height --      Head Circumference --      Peak Flow --      Pain Score 10/19/21 1121 10     Pain Loc --      Pain Edu? --  Excl. in [place]? --     Constitutional: Alert and oriented. Well appearing and in no acute distress. Eyes: Conjunctivae are normal. EOMI. Head: Atraumatic. Nose: No congestion/rhinnorhea. Mouth/Throat: Mucous membranes are moist.   Neck: No stridor. Trachea Midline. FROM Cardiovascular: Normal rate, regular rhythm. Grossly normal heart sounds.  Good peripheral circulation. Respiratory: poor air exchange, increased work of breathing on BiPAP Gastrointestinal: Soft and nontender. No distention. No abdominal bruits.  Musculoskeletal: No lower extremity tenderness nor edema.  No joint effusions. Neurologic:  Normal speech and language. No gross focal neurologic deficits are appreciated.  Skin:  Skin is warm, dry and intact. No rash noted. Psychiatric: Mood and affect are normal. Speech and behavior are normal. GU: Deferred   ____________________________________________   LABS (all labs ordered are listed, but only abnormal results are displayed)  Labs Reviewed  COMPREHENSIVE METABOLIC PANEL - Abnormal; Notable for the following components:      Result Value   Sodium 127 (*)    Chloride 91 (*)    Glucose, Bld 102 (*)    BUN 5 (*)    Calcium 8.5 (*)    Alkaline Phosphatase 37 (*)    All other components within normal limits  BLOOD GAS, VENOUS -  Abnormal; Notable for the following components:   Bicarbonate 31.1 (*)    Acid-Base Excess 5.2 (*)    All other components within normal limits  RESP PANEL BY RT-PCR (FLU A&B, COVID) ARPGX2  CBC WITH DIFFERENTIAL/PLATELET  BRAIN NATRIURETIC PEPTIDE  PROCALCITONIN  TROPONIN I (HIGH SENSITIVITY)  TROPONIN I (HIGH SENSITIVITY)   ____________________________________________   ED ECG REPORT I, Vanessa [place], the attending physician, personally viewed and interpreted this ECG.  EKG normal sinus rate of 97, no ST elevation, no T wave inversions except for aVL, normal intervals ____________________________________________  RADIOLOGY Robert Bellow, personally viewed and evaluated these images (plain radiographs) as part of my medical decision making, as well as reviewing the written report by the radiologist.  ED MD interpretation:  no PNA   Official radiology report(s): CT Angio Chest PE W and/or Wo Contrast  Result Date: 10/19/2021 CLINICAL DATA:  Pulmonary embolism. EXAM: CT ANGIOGRAPHY CHEST WITH CONTRAST TECHNIQUE: Multidetector CT imaging of the chest was performed using the standard protocol during bolus administration of intravenous contrast. Multiplanar CT image reconstructions and MIPs were obtained to evaluate the vascular anatomy. CONTRAST:  26mL OMNIPAQUE IOHEXOL 350 MG/ML SOLN COMPARISON:  CT 05/05/2021 FINDINGS: Cardiovascular: No filling defects within the pulmonary arteries to suggest acute pulmonary embolism. No significant vascular findings. Normal heart size. No pericardial effusion. Mediastinum/Nodes: No axillary or supraclavicular adenopathy. No mediastinal or hilar adenopathy. No pericardial fluid. Esophagus normal. Lungs/Pleura: No suspicious pulmonary nodules. Normal pleural. Airways normal. Upper Abdomen: Limited view of the liver, kidneys, pancreas are unremarkable. Normal adrenal glands. Musculoskeletal: No aggressive osseous lesion. 1. No acute pulmonary embolism. 2.  No acute pulmonary parenchymal findings. Review of the MIP images confirms the above findings. Electronically Signed   By: Suzy Bouchard M.D.   On: 10/19/2021 13:39   DG Chest Portable 1 View  Result Date: 10/19/2021 CLINICAL DATA:  Shortness of breath EXAM: PORTABLE CHEST 1 VIEW COMPARISON:  Chest x-ray dated October 12, 2021; CT chest dated May 01, 2021 FINDINGS: Cardiac and mediastinal contours are within normal limits. Mild lingular opacity favored to be due to scarring when compared with most recent prior CT. No new parenchymal opacity. No evidence of pleural effusion or pneumothorax. IMPRESSION: No active disease.  Electronically Signed   By: Yetta Glassman M.D.   On: 10/19/2021 11:43    ____________________________________________   PROCEDURES  Procedure(s) performed (including Critical Care):  .1-3 Lead EKG Interpretation Performed by: Vanessa [place], MD Authorized by: Vanessa [place], MD     Interpretation: normal     ECG rate:  90s   ECG rate assessment: normal     Rhythm: sinus rhythm     Ectopy: none     Conduction: normal   .Critical Care Performed by: Vanessa [place], MD Authorized by: Vanessa [place], MD   Critical care provider statement:    Critical care time (minutes):  30   Critical care was necessary to treat or prevent imminent or life-threatening deterioration of the following conditions:  Respiratory failure   Critical care was time spent personally by me on the following activities:  Development of treatment plan with patient or surrogate, discussions with consultants, evaluation of patient's response to treatment, examination of patient, ordering and review of laboratory studies, ordering and review of radiographic studies, ordering and performing treatments and interventions, pulse oximetry, re-evaluation of patient's condition and review of old charts   ____________________________________________   INITIAL IMPRESSION / ASSESSMENT AND PLAN / ED  COURSE   Colleen [name] was evaluated in Emergency Department on 10/19/2021 for the symptoms described in the history of present illness. She was evaluated in the context of the global COVID-19 pandemic, which necessitated consideration that the patient might be at risk for infection with the SARS-CoV-2 virus that causes COVID-19. Institutional protocols and algorithms that pertain to the evaluation of patients at risk for COVID-19 are in a state of rapid change based on information released by regulatory bodies including the CDC and federal and state organizations. These policies and algorithms were followed during the patient's care in the ED.     Pt presents with SOB.  Per EMS patient had tight lung sounds bilaterally concerning for COPD exacerbation therefore duo nebs and CPAP was started.    Other differential includes: PNA-will get xray to evaluation Anemia-CBC to evaluate ACS- will get trops Arrhythmia-Will get EKG and keep on monitor.  COVID- will get testing per algorithm. PE-lower suspicion given no risk factors and other cause more likely  12:36 PM reevaluated patient she reports feeling anxious.  Patient is allergic to all anxiety medications including lorazepam.  Hydroxyzine.  Patient's work-up thus far has been very reassuring.  We will take her off BiPAP and put her on 3 L for baseline.  However given her history of blood clots will get CT to make sure evidence of PE or underlying pneumonia missed on chest x-ray.  CT imaging negative reevaluated.  Patient still has some increased work of breathing in the upper 20s to 30s.  Not sure if a component of COPD versus anxiety.  She is also complaining of some muscle spasms and her sodium and chloride are low.  These have been repleted.  Discussed with patient and she felt more comfortably admitted to the hospital for COPD exacerbation and hyponatremia     ____________________________________________   FINAL CLINICAL IMPRESSION(S) /  ED DIAGNOSES   Final diagnoses:  COPD exacerbation ([place])  Hyponatremia     MEDICATIONS GIVEN DURING THIS VISIT:  Medications  tiZANidine (ZANAFLEX) tablet 4 mg (has no administration in time range)  ipratropium-albuterol (DUONEB) 0.5-2.5 (3) MG/3ML nebulizer solution 3 mL (3 mLs Nebulization Given 10/19/21 1201)  methylPREDNISolone sodium succinate (SOLU-MEDROL) 125 mg/2 mL injection 125 mg (125 mg  Intravenous Given 10/19/21 1128)  sodium chloride 0.9 % bolus 500 mL (500 mLs Intravenous New Bag/Given 10/19/21 1241)  gabapentin (NEURONTIN) capsule 600 mg (600 mg Oral Given 10/19/21 1308)  iohexol (OMNIPAQUE) 350 MG/ML injection 75 mL (75 mLs Intravenous Contrast Given 10/19/21 1313)     ED Discharge Orders     None        Note:  This document was prepared using Dragon voice recognition software and may include unintentional dictation errors.   Vanessa [place], MD 10/19/21 [phone]

## 2021-10-19 NOTE — H&P (Addendum)
History and Physical    Colleen [name] IHK:[id number] DOB: 1961/08/25 DOA: 10/19/2021  PCP: Remi Haggard, FNP   Patient coming from: Home  I have personally briefly reviewed patient's old medical records in [place]  Chief Complaint: Shortness of breath  HPI: Colleen [name] is a 60 y.o. female with medical history significant for COPD on 3 L of oxygen mostly at night, nicotine dependence, depression, anxiety and history of left breast cancer who presents to the emergency room via EMS for evaluation of worsening shortness of breath. Patient states that 2 weeks ago she had myalgias, nasal congestion, cough and headaches after being exposed to some family members who was sick.  She states that her symptoms improved but she continues to have postnasal drip and was seen by her primary care provider 1 day prior to admission. On the morning of her admission, she called EMS because of worsening shortness of breath.  Per EMS she had pulse oximetry of 89% on 3 L and had diffuse wheezes and so she was placed on a CPAP and transported to the ER. Shortness of breath is associated with a cough productive of clear phlegm and chest tightness with occasional wheezing. She complains of anorexia, nausea and poor oral intake. She denies having any fever, no chills, no changes in her bowel habits, no urinary symptoms, no dizziness, no lightheadedness, no abdominal pain, no lower extremity swelling, no palpitations, no diaphoresis. VBG 7.41/49/35/31/68 Sodium 127, potassium 4.1, chloride 91, bicarb 29, glucose 102, BUN 5, creatinine 0.48, calcium 8.5, alkaline phosphatase 37, albumin 4.1, AST 15, ALT 11, total protein 6.8, BNP 9.3, procalcitonin less than 0.10, white count 5.8, hemoglobin 14.0, hematocrit 42, MCV 90.3, RDW 13.0, platelet count 301 Respiratory viral panel is negative CT angiogram of the chest shows no evidence of pulmonary embolism.  No acute pulmonary parenchymal findings. Chest x-ray  reviewed by me shows no acute findings. Twelve-lead EKG reviewed by me shows normal sinus rhythm.   ED Course: Patient is a 60 year old female with a history of COPD with chronic respiratory failure on 3 L of oxygen at night who presents to the ER for evaluation of worsening shortness of breath associated with a cough productive of clear phlegm, chest tightness and wheezing. On 3 L of oxygen she had pulse oximetry of 88% and required BiPAP transiently. She will be referred to observation status for further evaluation.   Review of Systems: As per HPI otherwise all other systems reviewed and negative.    Past Medical History:  Diagnosis Date   Anxiety    Asthma    Breast cancer ([place]) 2004   left breast   Chronic back pain    COPD (chronic obstructive pulmonary disease) ([place])    Depression    Personal history of radiation therapy     Past Surgical History:  Procedure Laterality Date   BREAST BIOPSY Left 2004   positive   BREAST BIOPSY Right    neg   BREAST LUMPECTOMY Left 2004   positive   MASTECTOMY Left    MASTECTOMY     NEPHRECTOMY Left    TUBAL LIGATION       reports that she has been smoking cigarettes. She has been smoking an average of .5 packs per day. She has never used smokeless tobacco. She reports current alcohol use. She reports that she does not use drugs.  Allergies  Allergen Reactions   Desvenlafaxine Anaphylaxis   Morphine And Related Anaphylaxis   Penicillins Anaphylaxis  Has patient had a PCN reaction causing immediate rash, facial/tongue/throat swelling, SOB or lightheadedness with hypotension: Yes Has patient had a PCN reaction causing severe rash involving mucus membranes or skin necrosis: No Has patient had a PCN reaction that required hospitalization: Unknown Has patient had a PCN reaction occurring within the last 10 years: No If all of the above answers are "NO", then may proceed with Cephalosporin use.    Prazosin Other (See Comments)    Tamoxifen Anaphylaxis   Trazodone Anaphylaxis   Clonazepam    Codeine Swelling   Hydroxyzine Itching   Lorazepam     Patient feels this medications makes her to sedated and wants to avoid   Paroxetine Hcl Hives   Sulfa Antibiotics Itching   Cephalexin Rash    Family History  Problem Relation Age of Onset   Breast cancer Cousin    Breast cancer Cousin    Bipolar disorder Daughter    Drug abuse Daughter    Alcohol abuse Maternal Grandfather       Prior to Admission medications   Medication Sig Start Date End Date Taking? Authorizing Provider  Cholecalciferol 25 MCG (1000 UT) tablet Take by mouth.   Yes [provider]  cetirizine-pseudoephedrine (ZYRTEC-D) 5-120 MG tablet Take 1 tablet by mouth 2 (two) times daily as needed for allergies.    [provider]  denosumab (PROLIA) 60 MG/ML SOSY injection Inject 60 mg into the skin every 6 (six) months.    [provider]  diphenhydrAMINE (BENADRYL) 25 MG tablet Take 1 tablet (25 mg total) by mouth every 6 (six) hours for 3 days. 09/29/21 10/02/21  Carrie Mew, MD  doxycycline (VIBRAMYCIN) 100 MG capsule Take 1 capsule (100 mg total) by mouth 2 (two) times daily. 09/29/21   Carrie Mew, MD  DULoxetine (CYMBALTA) 60 MG capsule Take 1 capsule (60 mg total) by mouth 2 (two) times daily. 07/29/21   Ursula Alert, MD  EPINEPHrine 0.3 mg/0.3 mL IJ SOAJ injection Inject 0.3 mg into the muscle as needed.    [provider]  Fluticasone-Umeclidin-Vilant (TRELEGY ELLIPTA) 100-62.5-25 MCG/INH AEPB Inhale 1 puff into the lungs daily.    [provider]  gabapentin (NEURONTIN) 300 MG capsule Take 1-2 capsules (300-600 mg total) by mouth as directed. Take 1 capsule daily AM and 2 capsules daily at bedtime 09/23/21   Ursula Alert, MD  ipratropium (ATROVENT) 0.02 % nebulizer solution Take by nebulization 4 (four) times daily. 03/26/21   [provider]  montelukast (SINGULAIR) 10 MG  tablet Take 10 mg by mouth at bedtime.    [provider]  predniSONE (STERAPRED UNI-PAK 21 TAB) 10 MG (21) TBPK tablet 6 tablets on day 1, then 5 tablets on day 2, then 4 tablets on day 3, then 3 tablets on day 4, then 2 tablets on day 5, then 1 tablet on day 6. 09/29/21   Carrie Mew, MD  QUEtiapine (SEROQUEL) 25 MG tablet Take 0.5-1 tablets (12.5-25 mg total) by mouth at bedtime. 07/29/21   Ursula Alert, MD  tiZANidine (ZANAFLEX) 4 MG tablet Take 1 tablet (4 mg total) by mouth every 12 (twelve) hours as needed for muscle spasms. 10/09/21 11/08/21  Gillis Santa, MD  VENTOLIN HFA 108 (90 Base) MCG/ACT inhaler Inhale 1-2 puffs into the lungs every 4 (four) hours as needed for cough or wheezing. 10/02/18   [provider]    Physical Exam: Vitals:   10/19/21 1230 10/19/21 1245 10/19/21 1300 10/19/21 1330  BP: 120/89  Marland Kitchen)  134/92 126/89  Pulse: 93 95 (!) 108 (!) 102  Resp: (!) 26 (!) 29 (!) 21 (!) 31  Temp:      TempSrc:      SpO2: 97% 96% 93% 94%  Weight:         Vitals:   10/19/21 1230 10/19/21 1245 10/19/21 1300 10/19/21 1330  BP: 120/89  (!) 134/92 126/89  Pulse: 93 95 (!) 108 (!) 102  Resp: (!) 26 (!) 29 (!) 21 (!) 31  Temp:      TempSrc:      SpO2: 97% 96% 93% 94%  Weight:          Constitutional: Alert and oriented x 3 . Not in any apparent distress HEENT:      Head: Normocephalic and atraumatic.         Eyes: PERLA, EOMI, Conjunctivae are normal. Sclera is non-icteric.       Mouth/Throat: Mucous membranes are moist.       Neck: Supple with no signs of meningismus. Cardiovascular: Regular rate and rhythm. No murmurs, gallops, or rubs. 2+ symmetrical distal pulses are present . No JVD. No LE edema Respiratory: Tachypneic.bilateral air entry.  Scattered wheezes. Gastrointestinal: Soft, non tender, and non distended with positive bowel sounds.  Genitourinary: No CVA tenderness. Musculoskeletal: Nontender with normal range of motion in all  extremities. No cyanosis, or erythema of extremities. Neurologic:  Face is symmetric. Moving all extremities. No gross focal neurologic deficits . Skin: Skin is warm, dry.  No rash or ulcers Psychiatric: Mood and affect are normal    Labs on Admission: I have personally reviewed following labs and imaging studies  CBC: Recent Labs  Lab 10/12/21 1654 10/19/21 1120  WBC 6.2 5.8  NEUTROABS 3.4 3.4  HGB 14.1 14.0  HCT 43.1 42.1  MCV 92.1 90.3  PLT 277 182   Basic Metabolic Panel: Recent Labs  Lab 10/12/21 1654 10/19/21 1120  NA 130* 127*  K 4.2 4.1  CL 94* 91*  CO2 27 29  GLUCOSE 98 102*  BUN 6* 5*  CREATININE 0.41* 0.48  CALCIUM 8.8* 8.5*   GFR: Estimated Creatinine Clearance: 61.9 mL/min (by C-G formula based on SCr of 0.48 mg/dL). Liver Function Tests: Recent Labs  Lab 10/19/21 1120  AST 15  ALT 11  ALKPHOS 37*  BILITOT 0.6  PROT 6.8  ALBUMIN 4.1   No results for input(s): LIPASE, AMYLASE in the last 168 hours. No results for input(s): AMMONIA in the last 168 hours. Coagulation Profile: No results for input(s): INR, PROTIME in the last 168 hours. Cardiac Enzymes: No results for input(s): CKTOTAL, CKMB, CKMBINDEX, TROPONINI in the last 168 hours. BNP (last 3 results) No results for input(s): PROBNP in the last 8760 hours. HbA1C: No results for input(s): HGBA1C in the last 72 hours. CBG: No results for input(s): GLUCAP in the last 168 hours. Lipid Profile: No results for input(s): CHOL, HDL, LDLCALC, TRIG, CHOLHDL, LDLDIRECT in the last 72 hours. Thyroid Function Tests: No results for input(s): TSH, T4TOTAL, FREET4, T3FREE, THYROIDAB in the last 72 hours. Anemia Panel: No results for input(s): VITAMINB12, FOLATE, FERRITIN, TIBC, IRON, RETICCTPCT in the last 72 hours. Urine analysis:    Component Value Date/Time   COLORURINE YELLOW (A) 10/12/2018 0654   APPEARANCEUR CLEAR (A) 10/12/2018 0654   LABSPEC 1.015 10/12/2018 0654   PHURINE 6.0 10/12/2018  0654   GLUCOSEU 50 (A) 10/12/2018 0654   HGBUR NEGATIVE 10/12/2018 0654   [place] NEGATIVE 10/12/2018 0654  KETONESUR 20 (A) 10/12/2018 0654   PROTEINUR NEGATIVE 10/12/2018 0654   NITRITE NEGATIVE 10/12/2018 0654   LEUKOCYTESUR TRACE (A) 10/12/2018 0654    Radiological Exams on Admission: CT Angio Chest PE W and/or Wo Contrast  Result Date: 10/19/2021 CLINICAL DATA:  Pulmonary embolism. EXAM: CT ANGIOGRAPHY CHEST WITH CONTRAST TECHNIQUE: Multidetector CT imaging of the chest was performed using the standard protocol during bolus administration of intravenous contrast. Multiplanar CT image reconstructions and MIPs were obtained to evaluate the vascular anatomy. CONTRAST:  44mL OMNIPAQUE IOHEXOL 350 MG/ML SOLN COMPARISON:  CT 05/05/2021 FINDINGS: Cardiovascular: No filling defects within the pulmonary arteries to suggest acute pulmonary embolism. No significant vascular findings. Normal heart size. No pericardial effusion. Mediastinum/Nodes: No axillary or supraclavicular adenopathy. No mediastinal or hilar adenopathy. No pericardial fluid. Esophagus normal. Lungs/Pleura: No suspicious pulmonary nodules. Normal pleural. Airways normal. Upper Abdomen: Limited view of the liver, kidneys, pancreas are unremarkable. Normal adrenal glands. Musculoskeletal: No aggressive osseous lesion. 1. No acute pulmonary embolism. 2. No acute pulmonary parenchymal findings. Review of the MIP images confirms the above findings. Electronically Signed   By: Suzy Bouchard M.D.   On: 10/19/2021 13:39   DG Chest Portable 1 View  Result Date: 10/19/2021 CLINICAL DATA:  Shortness of breath EXAM: PORTABLE CHEST 1 VIEW COMPARISON:  Chest x-ray dated October 12, 2021; CT chest dated May 01, 2021 FINDINGS: Cardiac and mediastinal contours are within normal limits. Mild lingular opacity favored to be due to scarring when compared with most recent prior CT. No new parenchymal opacity. No evidence of pleural effusion or  pneumothorax. IMPRESSION: No active disease. Electronically Signed   By: Yetta Glassman M.D.   On: 10/19/2021 11:43     Assessment/Plan Principal Problem:   COPD with acute exacerbation (HCC) Active Problems:   GAD (generalized anxiety disorder)   Acute on chronic respiratory failure with hypoxia (HCC)   Chronic pain syndrome   Low back pain   MDD (major depressive disorder), recurrent episode, moderate (HCC)   Hyponatremia     Patient is a 60 year old admitted to the hospital for acute COPD exacerbation   COPD with acute exacerbation Patient presents for evaluation of worsening shortness of breath associated with a cough productive of clear phlegm and chest tightness She was hypoxic on 3 L of oxygen with tachypnea and had a pulse oximetry of 88% At baseline patient only wears 3 L of oxygen at night but now has a continuous need Continue oxygen supplementation to maintain pulse oximetry greater than 94% Place patient on systemic and inhaled steroids, scheduled and as needed bronchodilator therapy     Chronic low back Continue gabapentin and Cymbalta  Continue as needed     Depression Continue Cymbalta    Nicotine dependence Smoking cessation was discussed with patient She declines a nicotine transdermal patch at this time    Hyponatremia Most likely secondary to volume depletion from poor oral intake Will hydrate patient with normal saline Repeat sodium levels in a.m.    DVT prophylaxis: Lovenox  Code Status: full code  Family Communication: Greater than 50% of time was spent discussing patient's condition and plan of care with her at the bedside.  All questions and concerns have been addressed.  She verbalizes understanding and agrees with the plan. Disposition Plan: Back to previous home environment Consults called: none  Status:Observation    Briellah Baik MD [facility]     10/19/2021, 2:40 PM

## 2021-10-19 NOTE — ED Notes (Signed)
Patient to CT at this time

## 2021-10-19 NOTE — ED Notes (Signed)
Patient sleeping at this time. NAD noted.

## 2021-10-20 DIAGNOSIS — E869 Volume depletion, unspecified: Secondary | ICD-10-CM | POA: Diagnosis present

## 2021-10-20 DIAGNOSIS — Z853 Personal history of malignant neoplasm of breast: Secondary | ICD-10-CM | POA: Diagnosis not present

## 2021-10-20 DIAGNOSIS — Z888 Allergy status to other drugs, medicaments and biological substances status: Secondary | ICD-10-CM | POA: Diagnosis not present

## 2021-10-20 DIAGNOSIS — Z905 Acquired absence of kidney: Secondary | ICD-10-CM | POA: Diagnosis not present

## 2021-10-20 DIAGNOSIS — Z86718 Personal history of other venous thrombosis and embolism: Secondary | ICD-10-CM | POA: Diagnosis not present

## 2021-10-20 DIAGNOSIS — J441 Chronic obstructive pulmonary disease with (acute) exacerbation: Secondary | ICD-10-CM | POA: Diagnosis present

## 2021-10-20 DIAGNOSIS — Z20822 Contact with and (suspected) exposure to covid-19: Secondary | ICD-10-CM | POA: Diagnosis present

## 2021-10-20 DIAGNOSIS — Z803 Family history of malignant neoplasm of breast: Secondary | ICD-10-CM | POA: Diagnosis not present

## 2021-10-20 DIAGNOSIS — F17210 Nicotine dependence, cigarettes, uncomplicated: Secondary | ICD-10-CM | POA: Diagnosis present

## 2021-10-20 DIAGNOSIS — E871 Hypo-osmolality and hyponatremia: Secondary | ICD-10-CM

## 2021-10-20 DIAGNOSIS — M545 Low back pain, unspecified: Secondary | ICD-10-CM

## 2021-10-20 DIAGNOSIS — Z9012 Acquired absence of left breast and nipple: Secondary | ICD-10-CM | POA: Diagnosis not present

## 2021-10-20 DIAGNOSIS — J9621 Acute and chronic respiratory failure with hypoxia: Secondary | ICD-10-CM | POA: Diagnosis present

## 2021-10-20 DIAGNOSIS — Z881 Allergy status to other antibiotic agents status: Secondary | ICD-10-CM | POA: Diagnosis not present

## 2021-10-20 DIAGNOSIS — Z923 Personal history of irradiation: Secondary | ICD-10-CM | POA: Diagnosis not present

## 2021-10-20 DIAGNOSIS — G894 Chronic pain syndrome: Secondary | ICD-10-CM | POA: Diagnosis present

## 2021-10-20 DIAGNOSIS — Z882 Allergy status to sulfonamides status: Secondary | ICD-10-CM | POA: Diagnosis not present

## 2021-10-20 DIAGNOSIS — Z7951 Long term (current) use of inhaled steroids: Secondary | ICD-10-CM | POA: Diagnosis not present

## 2021-10-20 DIAGNOSIS — F411 Generalized anxiety disorder: Secondary | ICD-10-CM

## 2021-10-20 DIAGNOSIS — Z885 Allergy status to narcotic agent status: Secondary | ICD-10-CM | POA: Diagnosis not present

## 2021-10-20 DIAGNOSIS — F331 Major depressive disorder, recurrent, moderate: Secondary | ICD-10-CM | POA: Diagnosis present

## 2021-10-20 DIAGNOSIS — Z880 Allergy status to penicillin: Secondary | ICD-10-CM | POA: Diagnosis not present

## 2021-10-20 DIAGNOSIS — Z79899 Other long term (current) drug therapy: Secondary | ICD-10-CM | POA: Diagnosis not present

## 2021-10-20 LAB — CBC
HCT: 37.8 % (ref 36.0–46.0)
Hemoglobin: 12.5 g/dL (ref 12.0–15.0)
MCH: 29.5 pg (ref 26.0–34.0)
MCHC: 33.1 g/dL (ref 30.0–36.0)
MCV: 89.2 fL (ref 80.0–100.0)
Platelets: 265 10*3/uL (ref 150–400)
RBC: 4.24 MIL/uL (ref 3.87–5.11)
RDW: 12.8 % (ref 11.5–15.5)
WBC: 7.3 10*3/uL (ref 4.0–10.5)
nRBC: 0 % (ref 0.0–0.2)

## 2021-10-20 LAB — BASIC METABOLIC PANEL
Anion gap: 5 (ref 5–15)
BUN: 7 mg/dL — ABNORMAL LOW (ref 8–23)
CO2: 25 mmol/L (ref 22–32)
Calcium: 8.1 mg/dL — ABNORMAL LOW (ref 8.9–10.3)
Chloride: 96 mmol/L — ABNORMAL LOW (ref 98–111)
Creatinine, Ser: 0.39 mg/dL — ABNORMAL LOW (ref 0.44–1.00)
GFR, Estimated: >60 mL/min (ref >60)
Glucose, Bld: 111 mg/dL — ABNORMAL HIGH (ref 70–99)
Potassium: 4.6 mmol/L (ref 3.5–5.1)
Sodium: 126 mmol/L — ABNORMAL LOW (ref 135–145)

## 2021-10-20 LAB — OSMOLALITY, URINE: Osmolality, Ur: 522 mOsm/kg (ref 300–900)

## 2021-10-20 LAB — SODIUM, URINE, RANDOM: Sodium, Ur: 50 mmol/L

## 2021-10-20 LAB — PROCALCITONIN: Procalcitonin: 0.2 ng/mL

## 2021-10-20 LAB — HIV ANTIBODY (ROUTINE TESTING W REFLEX): HIV Screen 4th Generation wRfx: NONREACTIVE

## 2021-10-20 MED ORDER — SODIUM CHLORIDE 1 G PO TABS
1.0000 g | ORAL_TABLET | Freq: Two times a day (BID) | ORAL | Status: DC
  Administered 2021-10-20: 1 g via ORAL
  Filled 2021-10-20 (×2): qty 1

## 2021-10-20 MED ORDER — ORAL CARE MOUTH RINSE
15.0000 mL | Freq: Two times a day (BID) | OROMUCOSAL | Status: DC
  Administered 2021-10-21 – 2021-10-22 (×2): 15 mL via OROMUCOSAL

## 2021-10-20 MED ORDER — DIPHENHYDRAMINE HCL 25 MG PO CAPS
25.0000 mg | ORAL_CAPSULE | Freq: Three times a day (TID) | ORAL | Status: DC | PRN
  Administered 2021-10-20 – 2021-10-21 (×2): 25 mg via ORAL
  Filled 2021-10-20 (×2): qty 1

## 2021-10-20 NOTE — Plan of Care (Signed)

## 2021-10-20 NOTE — Progress Notes (Addendum)
PROGRESS NOTE  Colleen [name] OQH:[id number] DOB: 1960/11/26 DOA: 10/19/2021 PCP: Remi Haggard, FNP   LOS: 0 days   Brief narrative:  Colleen [name] is a 60 y.o. female with past medical history of COPD with chronic respiratory failure on 3 L of oxygen mostly at nighttime, nicotine dependence, depression anxiety and history of left breast cancer presented to hospital with worsening shortness of breath.  Patient had 2-week history of myalgias, nasal congestion, cough and headache after being exposed to family member who was sick.  Patient was recently seen in primary care office.  Her breathing got worse so EMS was called in.  She was noted to have pulse ox of 89% on 3 L of oxygen and had diffuse wheezing.  He was put on CPAP and was brought into the hospital.  In the ED, sodium was low at 127.  VBG showed 7.41/49/35/31/68.  Procalcitonin less than 0.10.  CBC with WBC at 5.8.  Respiratory viral panel was negative. CT angiogram of the chest showed no evidence of pulmonary embolism.  No acute pulmonary parenchymal findings.Chest x-ray showed no acute findings.  EKG showed no ischemic changes.  Patient was then placed in the hospital secondary to acute COPD exacerbation requiring transient BiPAP  Assessment/Plan:  Principal Problem:   COPD with acute exacerbation (HCC) Active Problems:   GAD (generalized anxiety disorder)   Acute on chronic respiratory failure with hypoxia (HCC)   Chronic pain syndrome   Low back pain   MDD (major depressive disorder), recurrent episode, moderate (HCC)   Hyponatremia   COPD with acute exacerbation Patient presented with worsening dyspnea cough chest tightness and was hypoxic on 3 L of oxygen presentation.  Wears oxygen 3 L at night but required otherwise in the hospital during the daytime as well..  Patient was started on IV and inhaled steroids bronchodilators.  Continue with prednisone and inhaled steroids.  Currently on 3 L of oxygen.  BNP of 9.3.   Troponins were negative.  Still feels not at her baseline.  Continue current level of treatment.  We will empirically add doxycycline for possible acute bronchitis.  Chronic low back pain/chronic pain syndrome.. On gabapentin and Cymbalta at home.  Resumed while in the hospital.  Patient follows up with pain management as outpatient.    Depression and anxiety Continue Cymbalta    Nicotine dependence Emphasized on quitting.  Denied nicotine patch.    Hyponatremia Sodium of 126 today from 127.  Unclear etiology.  Could be secondary to poor oral intake or SIADH.  On normal saline.  We will continue to monitor.  Urine osmolality is high, serum osmolality low and urinary sodium at 50.  Sodium level 1 week back was 130.  We will add salt tablets.  DVT prophylaxis: enoxaparin (LOVENOX) injection 40 mg Start: 10/19/21 1600   Code Status: Full code  Family Communication: Spoke with the patient at bedside.  Status is: Observation  The patient will require care spanning > 2 midnights and should be moved to inpatient because: Acute COPD exacerbation on supplemental oxygen, need for steroids and closer monitoring    Consultants: None  Procedures: None  Anti-infectives:  Doxycycline.  Anti-infectives (From admission, onward)    None      Subjective: Today, patient was seen and examined at bedside.  Complains of cough shortness of breath..  States that she was not able to sleep much.  Still feels very symptomatic.  Complains of back pain as well.  Objective: Vitals:   10/20/21  0645 10/20/21 0746  BP: 126/77 122/80  Pulse: 81 81  Resp:  18  Temp: 98.6 F (37 C) 98.1 F (36.7 C)  SpO2: 96% 96%    Intake/Output Summary (Last 24 hours) at 10/20/2021 0759 Last data filed at 10/20/2021 0600 Gross per 24 hour  Intake --  Output 500 ml  Net -500 ml   Filed Weights   10/19/21 1121 10/19/21 2253  Weight: 64.5 kg 65.1 kg   Body mass index is 28.03 kg/m.   Physical  Exam: GENERAL: Patient is alert awake and oriented. Not in obvious distress.  On nasal cannula oxygen. HENT: No scleral pallor or icterus. Pupils equally reactive to light. Oral mucosa is moist NECK: is supple, no gross swelling noted. CHEST: Diminished breath sounds bilaterally, occasional wheezes noted. CVS: S1 and S2 heard, no murmur. Regular rate and rhythm.  ABDOMEN: Soft, non-tender, bowel sounds are present. EXTREMITIES: No edema. CNS: Cranial nerves are intact. No focal motor deficits. SKIN: warm and dry without rashes.  Data Review: I have personally reviewed the following laboratory data and studies,  CBC: Recent Labs  Lab 10/19/21 1120 10/20/21 0600  WBC 5.8 7.3  NEUTROABS 3.4  --   HGB 14.0 12.5  HCT 42.1 37.8  MCV 90.3 89.2  PLT 301 703   Basic Metabolic Panel: Recent Labs  Lab 10/19/21 1120 10/20/21 0600  NA 127* 126*  K 4.1 4.6  CL 91* 96*  CO2 29 25  GLUCOSE 102* 111*  BUN 5* 7*  CREATININE 0.48 0.39*  CALCIUM 8.5* 8.1*   Liver Function Tests: Recent Labs  Lab 10/19/21 1120  AST 15  ALT 11  ALKPHOS 37*  BILITOT 0.6  PROT 6.8  ALBUMIN 4.1   No results for input(s): LIPASE, AMYLASE in the last 168 hours. No results for input(s): AMMONIA in the last 168 hours. Cardiac Enzymes: No results for input(s): CKTOTAL, CKMB, CKMBINDEX, TROPONINI in the last 168 hours. BNP (last 3 results) Recent Labs    10/19/21 1120  BNP 9.3    ProBNP (last 3 results) No results for input(s): PROBNP in the last 8760 hours.  CBG: No results for input(s): GLUCAP in the last 168 hours. Recent Results (from the past 240 hour(s))  Resp Panel by RT-PCR (Flu A&B, Covid) Nasopharyngeal Swab     Status: None   Collection Time: 10/19/21 11:20 AM   Specimen: Nasopharyngeal Swab; Nasopharyngeal(NP) swabs in vial transport medium  Result Value Ref Range Status   SARS Coronavirus 2 by RT PCR NEGATIVE NEGATIVE Final    Comment: (NOTE) SARS-CoV-2 target nucleic acids are  NOT DETECTED.  The SARS-CoV-2 RNA is generally detectable in upper respiratory specimens during the acute phase of infection. The lowest concentration of SARS-CoV-2 viral copies this assay can detect is 138 copies/mL. A negative result does not preclude SARS-Cov-2 infection and should not be used as the sole basis for treatment or other patient management decisions. A negative result may occur with  improper specimen collection/handling, submission of specimen other than nasopharyngeal swab, presence of viral mutation(s) within the areas targeted by this assay, and inadequate number of viral copies(<138 copies/mL). A negative result must be combined with clinical observations, patient history, and epidemiological information. The expected result is Negative.  Fact Sheet for Patients:  EntrepreneurPulse.com.au  Fact Sheet for Healthcare Providers:  IncredibleEmployment.be  This test is no t yet approved or cleared by the [place] FDA and  has been authorized for detection and/or diagnosis of SARS-CoV-2 by FDA  under an Emergency Use Authorization (EUA). This EUA will remain  in effect (meaning this test can be used) for the duration of the COVID-19 declaration under Section 564(b)(1) of the Act, 21 U.S.C.section 360bbb-3(b)(1), unless the authorization is terminated  or revoked sooner.       Influenza A by PCR NEGATIVE NEGATIVE Final   Influenza B by PCR NEGATIVE NEGATIVE Final    Comment: (NOTE) The Xpert Xpress SARS-CoV-2/FLU/RSV plus assay is intended as an aid in the diagnosis of influenza from Nasopharyngeal swab specimens and should not be used as a sole basis for treatment. Nasal washings and aspirates are unacceptable for Xpert Xpress SARS-CoV-2/FLU/RSV testing.  Fact Sheet for Patients: EntrepreneurPulse.com.au  Fact Sheet for Healthcare Providers: IncredibleEmployment.be  This test is not  yet approved or cleared by the [place] FDA and has been authorized for detection and/or diagnosis of SARS-CoV-2 by FDA under an Emergency Use Authorization (EUA). This EUA will remain in effect (meaning this test can be used) for the duration of the COVID-19 declaration under Section 564(b)(1) of the Act, 21 U.S.C. section 360bbb-3(b)(1), unless the authorization is terminated or revoked.  Performed at [facility], [address] [place], [place] [address]      Studies: CT Angio Chest PE W and/or Wo Contrast  Result Date: 10/19/2021 CLINICAL DATA:  Pulmonary embolism. EXAM: CT ANGIOGRAPHY CHEST WITH CONTRAST TECHNIQUE: Multidetector CT imaging of the chest was performed using the standard protocol during bolus administration of intravenous contrast. Multiplanar CT image reconstructions and MIPs were obtained to evaluate the vascular anatomy. CONTRAST:  30mL OMNIPAQUE IOHEXOL 350 MG/ML SOLN COMPARISON:  CT 05/05/2021 FINDINGS: Cardiovascular: No filling defects within the pulmonary arteries to suggest acute pulmonary embolism. No significant vascular findings. Normal heart size. No pericardial effusion. Mediastinum/Nodes: No axillary or supraclavicular adenopathy. No mediastinal or hilar adenopathy. No pericardial fluid. Esophagus normal. Lungs/Pleura: No suspicious pulmonary nodules. Normal pleural. Airways normal. Upper Abdomen: Limited view of the liver, kidneys, pancreas are unremarkable. Normal adrenal glands. Musculoskeletal: No aggressive osseous lesion. 1. No acute pulmonary embolism. 2. No acute pulmonary parenchymal findings. Review of the MIP images confirms the above findings. Electronically Signed   By: Suzy Bouchard M.D.   On: 10/19/2021 13:39   DG Chest Portable 1 View  Result Date: 10/19/2021 CLINICAL DATA:  Shortness of breath EXAM: PORTABLE CHEST 1 VIEW COMPARISON:  Chest x-ray dated October 12, 2021; CT chest dated May 01, 2021 FINDINGS: Cardiac and  mediastinal contours are within normal limits. Mild lingular opacity favored to be due to scarring when compared with most recent prior CT. No new parenchymal opacity. No evidence of pleural effusion or pneumothorax. IMPRESSION: No active disease. Electronically Signed   By: Yetta Glassman M.D.   On: 10/19/2021 11:43      Flora Lipps, MD  [facility] 10/20/2021  If 7PM-7AM, please contact night-coverage

## 2021-10-21 LAB — BLOOD GAS, ARTERIAL
Acid-Base Excess: 6.1 mmol/L — ABNORMAL HIGH (ref 0.0–2.0)
Bicarbonate: 31.8 mmol/L — ABNORMAL HIGH (ref 20.0–28.0)
FIO2: 0.21
O2 Saturation: 88.3 %
Patient temperature: 37
pCO2 arterial: 49 mmHg — ABNORMAL HIGH (ref 32.0–48.0)
pH, Arterial: 7.42 (ref 7.350–7.450)
pO2, Arterial: 54 mmHg — ABNORMAL LOW (ref 83.0–108.0)

## 2021-10-21 LAB — BASIC METABOLIC PANEL
Anion gap: 3 — ABNORMAL LOW (ref 5–15)
BUN: 7 mg/dL — ABNORMAL LOW (ref 8–23)
CO2: 29 mmol/L (ref 22–32)
Calcium: 7.8 mg/dL — ABNORMAL LOW (ref 8.9–10.3)
Chloride: 89 mmol/L — ABNORMAL LOW (ref 98–111)
Creatinine, Ser: 0.49 mg/dL (ref 0.44–1.00)
GFR, Estimated: >60 mL/min (ref >60)
Glucose, Bld: 94 mg/dL (ref 70–99)
Potassium: 4.1 mmol/L (ref 3.5–5.1)
Sodium: 121 mmol/L — ABNORMAL LOW (ref 135–145)

## 2021-10-21 LAB — CBC
HCT: 35.4 % — ABNORMAL LOW (ref 36.0–46.0)
Hemoglobin: 12 g/dL (ref 12.0–15.0)
MCH: 30.4 pg (ref 26.0–34.0)
MCHC: 33.9 g/dL (ref 30.0–36.0)
MCV: 89.6 fL (ref 80.0–100.0)
Platelets: 251 10*3/uL (ref 150–400)
RBC: 3.95 MIL/uL (ref 3.87–5.11)
RDW: 12.5 % (ref 11.5–15.5)
WBC: 7.9 10*3/uL (ref 4.0–10.5)
nRBC: 0 % (ref 0.0–0.2)

## 2021-10-21 LAB — MAGNESIUM: Magnesium: 2 mg/dL (ref 1.7–2.4)

## 2021-10-21 LAB — PROCALCITONIN: Procalcitonin: <0.1 ng/mL

## 2021-10-21 LAB — TSH: TSH: 0.864 u[IU]/mL (ref 0.350–4.500)

## 2021-10-21 MED ORDER — MONTELUKAST SODIUM 10 MG PO TABS
10.0000 mg | ORAL_TABLET | Freq: Every day | ORAL | Status: DC
  Administered 2021-10-21: 10 mg via ORAL
  Filled 2021-10-21: qty 1

## 2021-10-21 MED ORDER — IPRATROPIUM-ALBUTEROL 0.5-2.5 (3) MG/3ML IN SOLN
3.0000 mL | RESPIRATORY_TRACT | Status: DC | PRN
  Administered 2021-10-21 (×2): 3 mL via RESPIRATORY_TRACT
  Filled 2021-10-21 (×2): qty 3

## 2021-10-21 MED ORDER — SODIUM CHLORIDE 1 G PO TABS
2.0000 g | ORAL_TABLET | Freq: Three times a day (TID) | ORAL | Status: DC
  Administered 2021-10-21 – 2021-10-22 (×4): 2 g via ORAL
  Filled 2021-10-21 (×5): qty 2

## 2021-10-21 MED ORDER — QUETIAPINE FUMARATE 25 MG PO TABS
25.0000 mg | ORAL_TABLET | Freq: Two times a day (BID) | ORAL | Status: DC
  Administered 2021-10-21 – 2021-10-22 (×3): 25 mg via ORAL
  Filled 2021-10-21 (×3): qty 1

## 2021-10-21 MED ORDER — FLUTICASONE PROPIONATE 50 MCG/ACT NA SUSP
2.0000 | Freq: Every day | NASAL | Status: DC
Start: 2021-10-21 — End: 2021-10-22
  Administered 2021-10-21 – 2021-10-22 (×2): 2 via NASAL
  Filled 2021-10-21: qty 16

## 2021-10-21 MED ORDER — QUETIAPINE FUMARATE 25 MG PO TABS: 25.0000 mg | ORAL_TABLET | Freq: Every day | ORAL | Status: DC

## 2021-10-21 NOTE — Progress Notes (Signed)
PROGRESS NOTE  Colleen [name] NFA:[id number] DOB: 03/04/1961 DOA: 10/19/2021 PCP: Remi Haggard, FNP   LOS: 1 day   Brief narrative:  Colleen [name] is a 60 y.o. female with past medical history of COPD with chronic respiratory failure on 3 L of oxygen mostly at nighttime, nicotine dependence, depression anxiety and history of left breast cancer presented to hospital with worsening shortness of breath.  Patient had 2-week history of myalgias, nasal congestion, cough and headache after being exposed to family member who was sick.  Patient was recently seen in primary care office.  Her breathing got worse so EMS was called in.  She was noted to have pulse ox of 89% on 3 L of oxygen and had diffuse wheezing.  He was put on CPAP and was brought into the hospital.  In the ED, sodium was low at 127.  VBG showed 7.41/49/35/31/68.  Procalcitonin less than 0.10.  CBC with WBC at 5.8.  Respiratory viral panel was negative. CT angiogram of the chest showed no evidence of pulmonary embolism.  No acute pulmonary parenchymal findings.Chest x-ray showed no acute findings.  EKG showed no ischemic changes.  Patient was then placed in the hospital secondary to acute COPD exacerbation requiring transient BiPAP  Assessment/Plan:  Principal Problem:   COPD with acute exacerbation (HCC) Active Problems:   GAD (generalized anxiety disorder)   Acute on chronic respiratory failure with hypoxia (HCC)   Chronic pain syndrome   Low back pain   MDD (major depressive disorder), recurrent episode, moderate (HCC)   Hyponatremia   Acute exacerbation of chronic obstructive pulmonary disease (COPD) ([place])   COPD with acute exacerbation Patient usually wears 3 L of oxygen at nighttime but has been requiring it throughout the day here.  Patient was started on IV and inhaled steroids bronchodilators.  Continue with prednisone and inhaled steroids.   BNP of 9.3.  Troponins were negative.  ABG showed PCO2 49.  Procalcitonin  less than 0.10.  Cough, postnasal drip.  We will start the patient on fluticasone spray.  Chronic low back pain/chronic pain syndrome.. On gabapentin and Cymbalta at home.  We will continue while in the hospital.  Patient follows up with pain management as outpatient.  Continue Lidoderm patch as well.  Continue tizanidine for muscle spasm    Depression and anxiety Continue Cymbalta.  Patient has been seen by psychiatry as outpatient.  Has allergy to multiple medications including paroxetine, benzodiazepines.  Was on Seroquel at home will initiate while in the hospital twice a day.  Patient has been advised to follow-up with her psychiatrist as outpatient    Nicotine dependence Declined nicotine patch.   Hyponatremia Sodium of 121 today from 126 < 127.  Unclear etiology.  Urine osmolality is high, serum osmolality low and urinary sodium at 50.  Sodium level 1 week back was 130.  Salt tablets were initiated yesterday but will increase the dose today.  Patient was encouraged to decrease excessive free water intake.  Check BMP in AM.  We will add fluid restriction to 1200 mL/day.  Add a cortisol level in a.m., check TSH.  Disposition.  Likely home in 1 to 2 days if sodium level improves and clinically improving.  DVT prophylaxis: enoxaparin (LOVENOX) injection 40 mg Start: 10/19/21 1600   Code Status: Full code  Family Communication:  Spoke with the patient at bedside.  I also spoke with the patient's husband on the phone and updated him about the clinical condition of the patient  Status is: Inpatient  The patient is inpatient because: Acute COPD exacerbation on supplemental oxygen, need for steroids and closer monitoring, severe hyponatremia  Consultants: None  Procedures: None  Anti-infectives:  None  Anti-infectives (From admission, onward)    None      Subjective: Today, patient was seen and examined at bedside.  Feels anxious and has cough and shortness of breath.   States that she has postnasal drip and continues to cough.  Objective: Vitals:   10/21/21 0423 10/21/21 0814  BP: 112/78 123/84  Pulse: 73 82  Resp: 16 18  Temp: 98.4 F (36.9 C) 98.4 F (36.9 C)  SpO2: 94% 92%    Intake/Output Summary (Last 24 hours) at 10/21/2021 1338 Last data filed at 10/21/2021 1013 Gross per 24 hour  Intake 960 ml  Output 800 ml  Net 160 ml    Filed Weights   10/19/21 1121 10/19/21 2253  Weight: 64.5 kg 65.1 kg   Body mass index is 28.03 kg/m.   Physical Exam: GENERAL: Patient is alert awake and oriented. Not in obvious distress.  Nasal cannula oxygen, appears anxious  HENT: No scleral pallor or icterus. Pupils equally reactive to light. Oral mucosa is moist NECK: is supple, no gross swelling noted. CHEST: Diminished breath sounds bilaterally, no obvious wheezing noted. CVS: S1 and S2 heard, no murmur. Regular rate and rhythm.  ABDOMEN: Soft, non-tender, bowel sounds are present. EXTREMITIES: No edema. CNS: Cranial nerves are intact. No focal motor deficits. SKIN: warm and dry without rashes.  Data Review: I have personally reviewed the following laboratory data and studies,  CBC: Recent Labs  Lab 10/19/21 1120 10/20/21 0600 10/21/21 0453  WBC 5.8 7.3 7.9  NEUTROABS 3.4  --   --   HGB 14.0 12.5 12.0  HCT 42.1 37.8 35.4*  MCV 90.3 89.2 89.6  PLT 301 265 025    Basic Metabolic Panel: Recent Labs  Lab 10/19/21 1120 10/20/21 0600 10/21/21 0453  NA 127* 126* 121*  K 4.1 4.6 4.1  CL 91* 96* 89*  CO2 29 25 29   GLUCOSE 102* 111* 94  BUN 5* 7* 7*  CREATININE 0.48 0.39* 0.49  CALCIUM 8.5* 8.1* 7.8*  MG  --   --  2.0    Liver Function Tests: Recent Labs  Lab 10/19/21 1120  AST 15  ALT 11  ALKPHOS 37*  BILITOT 0.6  PROT 6.8  ALBUMIN 4.1    No results for input(s): LIPASE, AMYLASE in the last 168 hours. No results for input(s): AMMONIA in the last 168 hours. Cardiac Enzymes: No results for input(s): CKTOTAL, CKMB,  CKMBINDEX, TROPONINI in the last 168 hours. BNP (last 3 results) Recent Labs    10/19/21 1120  BNP 9.3     ProBNP (last 3 results) No results for input(s): PROBNP in the last 8760 hours.  CBG: No results for input(s): GLUCAP in the last 168 hours. Recent Results (from the past 240 hour(s))  Resp Panel by RT-PCR (Flu A&B, Covid) Nasopharyngeal Swab     Status: None   Collection Time: 10/19/21 11:20 AM   Specimen: Nasopharyngeal Swab; Nasopharyngeal(NP) swabs in vial transport medium  Result Value Ref Range Status   SARS Coronavirus 2 by RT PCR NEGATIVE NEGATIVE Final    Comment: (NOTE) SARS-CoV-2 target nucleic acids are NOT DETECTED.  The SARS-CoV-2 RNA is generally detectable in upper respiratory specimens during the acute phase of infection. The lowest concentration of SARS-CoV-2 viral copies this assay can detect is 138 copies/mL.  A negative result does not preclude SARS-Cov-2 infection and should not be used as the sole basis for treatment or other patient management decisions. A negative result may occur with  improper specimen collection/handling, submission of specimen other than nasopharyngeal swab, presence of viral mutation(s) within the areas targeted by this assay, and inadequate number of viral copies(<138 copies/mL). A negative result must be combined with clinical observations, patient history, and epidemiological information. The expected result is Negative.  Fact Sheet for Patients:  EntrepreneurPulse.com.au  Fact Sheet for Healthcare Providers:  IncredibleEmployment.be  This test is no t yet approved or cleared by the [place] FDA and  has been authorized for detection and/or diagnosis of SARS-CoV-2 by FDA under an Emergency Use Authorization (EUA). This EUA will remain  in effect (meaning this test can be used) for the duration of the COVID-19 declaration under Section 564(b)(1) of the Act, 21 U.S.C.section  360bbb-3(b)(1), unless the authorization is terminated  or revoked sooner.       Influenza A by PCR NEGATIVE NEGATIVE Final   Influenza B by PCR NEGATIVE NEGATIVE Final    Comment: (NOTE) The Xpert Xpress SARS-CoV-2/FLU/RSV plus assay is intended as an aid in the diagnosis of influenza from Nasopharyngeal swab specimens and should not be used as a sole basis for treatment. Nasal washings and aspirates are unacceptable for Xpert Xpress SARS-CoV-2/FLU/RSV testing.  Fact Sheet for Patients: EntrepreneurPulse.com.au  Fact Sheet for Healthcare Providers: IncredibleEmployment.be  This test is not yet approved or cleared by the [place] FDA and has been authorized for detection and/or diagnosis of SARS-CoV-2 by FDA under an Emergency Use Authorization (EUA). This EUA will remain in effect (meaning this test can be used) for the duration of the COVID-19 declaration under Section 564(b)(1) of the Act, 21 U.S.C. section 360bbb-3(b)(1), unless the authorization is terminated or revoked.  Performed at [facility], [address], [place], [place] [address]      Studies: No results found.   Flora Lipps, MD  [facility] 10/21/2021  If 7PM-7AM, please contact night-coverage

## 2021-10-21 NOTE — TOC Initial Note (Signed)
Transition of Care [facility]) - Initial/Assessment Note    Patient Details  Name: Colleen [name] MRN: [id number] Date of Birth: [birth year]  [facility]) CM/SW Contact:    Beverly Sessions, RN Phone Number: 10/21/2021, 9:58 AM  Clinical Narrative:                  Admitted OQH:UTML Admitted from: Home YYT:[id number] Current home health/prior home health/DME:Reported patient wears 3L nocturnal at home, and continuous here.  Zack Blank with [employer] review if patient would qualify for a trilogy, and will confirm if patient has O2 through them and what her orders are    Expected Discharge Plan: Home/Self Care Barriers to Discharge: Continued Medical Work up   Patient Goals and CMS Choice        Expected Discharge Plan and Services Expected Discharge Plan: Home/Self Care       Living arrangements for the past 2 months: Single Family Home                                      Prior Living Arrangements/Services Living arrangements for the past 2 months: Single Family Home                     Activities of Daily Living Home Assistive Devices/Equipment: None ADL Screening (condition at time of admission) Patient's cognitive ability adequate to safely complete daily activities?: Yes Is the patient deaf or have difficulty hearing?: No Does the patient have difficulty seeing, even when wearing glasses/contacts?: No Does the patient have difficulty concentrating, remembering, or making decisions?: No Patient able to express need for assistance with ADLs?: No Does the patient have difficulty dressing or bathing?: No Independently performs ADLs?: Yes (appropriate for developmental age) Does the patient have difficulty walking or climbing stairs?: No Weakness of Legs: None Weakness of Arms/Hands: None  Permission Sought/Granted                  Emotional Assessment       Orientation: : Oriented to Self, Oriented to Place,  Oriented to  Time, Oriented to Situation Alcohol / Substance Use: Not Applicable Psych Involvement: No (comment)  Admission diagnosis:  Hyponatremia [E87.1] COPD exacerbation (HCC) [J44.1] COPD with acute exacerbation ([place]) [J44.1] Acute exacerbation of chronic obstructive pulmonary disease (COPD) ([place]) [J44.1] Patient Active Problem List   Diagnosis Date Noted   Acute exacerbation of chronic obstructive pulmonary disease (COPD) ([place]) 10/20/2021   COPD with acute exacerbation ([place]) 10/19/2021   Hyponatremia 10/19/2021   MDD (major depressive disorder), recurrent, in full remission ([place]) 07/29/2021   Tobacco use disorder 06/19/2021   Insomnia due to medical condition 03/21/2021   MDD (major depressive disorder), recurrent episode, moderate ([place]) 03/04/2021   PTSD (post-traumatic stress disorder) 03/04/2021   Myofascial pain syndrome 07/09/2020   Lumbar radiculopathy 07/09/2020   Chronic pain syndrome 06/04/2020   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, L5/S1) 06/04/2020   Lumbar facet arthropathy (L4,5 S1) 06/04/2020   Cervical facet joint syndrome 06/04/2020   Osteopenia of multiple sites 06/04/2020   Age related osteoporosis 06/04/2020   Low back pain 04/27/2020   COPD exacerbation ([place]) 03/20/2020   Asthma, chronic, unspecified asthma severity, with acute exacerbation 03/20/2020   Panic attack 03/20/2020   Acute on chronic respiratory failure with hypoxia ([place])    Sepsis ([place]) 10/11/2018   CAP (community acquired pneumonia) 10/11/2018   COPD (  chronic obstructive pulmonary disease) ([place]) 10/11/2018   GAD (generalized anxiety disorder) 10/11/2018   PCP:  Remi Haggard, FNP Pharmacy:   [place], [place] [address] Phone: [phone] Fax: [phone]     Social Determinants of Health (SDOH) Interventions    Readmission Risk Interventions No flowsheet data found.

## 2021-10-22 LAB — CBC
HCT: 37.8 % (ref 36.0–46.0)
Hemoglobin: 12.8 g/dL (ref 12.0–15.0)
MCH: 29.9 pg (ref 26.0–34.0)
MCHC: 33.9 g/dL (ref 30.0–36.0)
MCV: 88.3 fL (ref 80.0–100.0)
Platelets: 252 10*3/uL (ref 150–400)
RBC: 4.28 MIL/uL (ref 3.87–5.11)
RDW: 12.5 % (ref 11.5–15.5)
WBC: 7 10*3/uL (ref 4.0–10.5)
nRBC: 0 % (ref 0.0–0.2)

## 2021-10-22 LAB — BASIC METABOLIC PANEL
Anion gap: 4 — ABNORMAL LOW (ref 5–15)
BUN: 9 mg/dL (ref 8–23)
CO2: 28 mmol/L (ref 22–32)
Calcium: 8.3 mg/dL — ABNORMAL LOW (ref 8.9–10.3)
Chloride: 95 mmol/L — ABNORMAL LOW (ref 98–111)
Creatinine, Ser: 0.43 mg/dL — ABNORMAL LOW (ref 0.44–1.00)
GFR, Estimated: >60 mL/min (ref >60)
Glucose, Bld: 87 mg/dL (ref 70–99)
Potassium: 4.1 mmol/L (ref 3.5–5.1)
Sodium: 127 mmol/L — ABNORMAL LOW (ref 135–145)

## 2021-10-22 LAB — CORTISOL-AM, BLOOD: Cortisol - AM: 1.6 ug/dL — ABNORMAL LOW (ref 6.7–22.6)

## 2021-10-22 MED ORDER — PREDNISONE 20 MG PO TABS: 40.0000 mg | ORAL_TABLET | Freq: Every day | ORAL | 0 refills | Status: AC

## 2021-10-22 MED ORDER — LIDOCAINE 5 % EX PTCH: 1.0000 | MEDICATED_PATCH | CUTANEOUS | 0 refills | Status: DC

## 2021-10-22 MED ORDER — PREDNISONE 20 MG PO TABS: 40.0000 mg | ORAL_TABLET | Freq: Every day | ORAL | 0 refills | Status: DC

## 2021-10-22 MED ORDER — QUETIAPINE FUMARATE 50 MG PO TABS: 50.0000 mg | ORAL_TABLET | Freq: Every day | ORAL | 0 refills | Status: DC

## 2021-10-22 MED ORDER — FLUTICASONE PROPIONATE 50 MCG/ACT NA SUSP: 2.0000 | Freq: Every day | NASAL | 0 refills | Status: DC

## 2021-10-22 MED ORDER — SODIUM CHLORIDE 1 G PO TABS: 1.0000 g | ORAL_TABLET | Freq: Two times a day (BID) | ORAL | 0 refills | Status: AC

## 2021-10-22 MED ORDER — SODIUM CHLORIDE 1 G PO TABS: 1.0000 g | ORAL_TABLET | Freq: Two times a day (BID) | ORAL | 0 refills | Status: DC

## 2021-10-22 NOTE — TOC Transition Note (Signed)
Transition of Care [facility]) - CM/SW Discharge Note   Patient Details  Name: Dreana Britz MRN: [id number] Date of Birth: Jun 20, 1961  [facility]) CM/SW Contact:  Beverly Sessions, RN Phone Number: 10/22/2021, 12:55 PM   Clinical Narrative:     Patient to discharge today Confirmed with Caryl Pina from [facility] that patient has continuous o2 through them Husband to bring portable O2 for discharge Patient confirms that she would interested in an NIV from [facility] form and requested documentation faxed to [facility].  They will coordinate delivery of the NIV with the patient     Barriers to Discharge: Continued Medical Work up   Patient Goals and CMS Choice        Discharge Placement                       Discharge Plan and Services                                     Social Determinants of Health (SDOH) Interventions     Readmission Risk Interventions No flowsheet data found.

## 2021-10-22 NOTE — Progress Notes (Signed)
Mobility Specialist - Progress Note   10/22/21 1000  Mobility  Activity Ambulated in hall  Level of Assistance Standby assist, set-up cues, supervision of patient - no hands on  Assistive Device None  Distance Ambulated (ft) 180 ft  Mobility Ambulated with assistance in hallway  Mobility Response Tolerated well  Mobility performed by Mobility specialist  $Mobility charge 1 Mobility    O2 while resting on RA = 84% O2 while AMB on RA = n/a  O2 while AMB on 3L = 90%    Pt lying in bed upon arrival, utilizing RA. Pt states she had an anxiety attack moments prior to entry. Reports being confused as to what is going on in regards to plan of care. [place] reconnected, calming technique used. Pt ambulated in hallway without AD. No LOB. O2 maintained > 88% on 3L. Pt left in bed with all needs in reach, back on RA with sats at 91%. RN notified.    Kathee Delton Mobility Specialist 10/22/21, 10:16 AM

## 2021-10-22 NOTE — Plan of Care (Signed)

## 2021-10-22 NOTE — Discharge Summary (Signed)
Physician Discharge Summary  [name] JJH:[id number] DOB: 06/14/1961 DOA: 10/19/2021  PCP: Remi Haggard, FNP  Admit date: 10/19/2021 Discharge date: 10/22/2021  Admitted From: Home  Discharge disposition: Home  Recommendations for Outpatient Follow-Up:   Follow up with your primary care provider in one week.  Check CBC, BMP, magnesium in the next visit Please monitor sodium levels..  Patient was started on some salt tablets.  A.m. cortisol was slightly low this might need to be followed up as outpatient. Follow-up with your psychiatrist as has been  scheduled to discuss about medications for anxiety depression. Patient has been considered for noninvasive ventilation at home which will be arranged by home health services.  Discharge Diagnosis:   Principal Problem:   COPD with acute exacerbation ([place]) Active Problems:   GAD (generalized anxiety disorder)   Acute on chronic respiratory failure with hypoxia (HCC)   Chronic pain syndrome   Low back pain   MDD (major depressive disorder), recurrent episode, moderate (HCC)   Hyponatremia   Acute exacerbation of chronic obstructive pulmonary disease (COPD) ([place])   Discharge Condition: Improved.  Diet recommendation: Regular.  Wound care: None.  Code status: Full.   History of Present Illness:   Colleen [name] is a 60 y.o. female with past medical history of COPD with chronic respiratory failure on 3 L of oxygen mostly at nighttime, nicotine dependence, depression anxiety and history of left breast cancer presented to hospital with worsening shortness of breath.  Patient had 2-week history of myalgias, nasal congestion, cough and headache after being exposed to family member who was sick.  Patient was recently seen in primary care office.  Her breathing got worse so EMS was called in.  She was noted to have pulse ox of 89% on 3 L of oxygen and had diffuse wheezing.  He was put on CPAP and was brought into the hospital.   In the ED, sodium was low at 127.  VBG showed 7.41/49/35/31/68.  Procalcitonin less than 0.10.  CBC with WBC at 5.8.  Respiratory viral panel was negative. CT angiogram of the chest showed no evidence of pulmonary embolism.  No acute pulmonary parenchymal findings.Chest x-ray showed no acute findings.  EKG showed no ischemic changes.  Patient was then placed in the hospital secondary to acute COPD exacerbation requiring transient BiPAP.  Hospital Course:   Following conditions were addressed during hospitalization as listed below,  COPD with acute exacerbation Patient usually wears 3 L of oxygen at nighttime but had been requiring continuously on initial admission.  Patient received IV and inhaled steroids bronchodilators.  Continue with prednisone and inhaled steroids.   BNP of 9.3.  Troponins were negative.  ABG showed PCO2 49.  At this time patient would benefit from noninvasive ventilation at home to reduce her risk of recurrent admissions and COPD flare with improvement in  quality of life.  Noninvasive ventilation will be arranged as outpatient by home health.  Procalcitonin less than 0.10.  At this time patient has clinically improved and will be discharged home on prednisone for the next few days.   Cough, postnasal drip.  Improved with fluticasone nasal spray.  This will be continued on discharge.   Chronic low back pain/chronic pain syndrome.. On gabapentin and Cymbalta at home.   Patient follows up with pain management as outpatient.  Continue Lidoderm patch and tizanidine for muscle spasm    Depression and anxiety Continue Cymbalta.  Patient has been seen by psychiatry as outpatient.  Has allergy  to multiple medications including paroxetine, benzodiazepines.  Was on Seroquel at home which was initiated while in the hospital.  Patient feels like she might have some allergy to Cymbalta I have encouraged her to discuss this with her psychiatrist.  Seroquel was initiated at bedtime doses on  discharge.  Nicotine dependence Declined nicotine patch.    Hyponatremia Could be SIADH due to medications/chronic pain.  Sodium of 127 today from 121.  Unclear etiology.  Urine osmolality is high, serum osmolality low and urinary sodium at 50.  Sodium level 1 week back was 130.  Salt tablets were initiated with mild improvement.  TSH within normal limits but a.m. cortisol low.  Patient however clinically feels significantly better.  This will need to be assessed as outpatient.    Disposition.  At this time, patient is stable for disposition home with home health with outpatient PCP follow-up.  Medical Consultants:   None.  Procedures:    None Subjective:   Today, patient was seen and examined at bedside.  Patient continues to feel much better today.  Breathing has improved.  Has less cough.  Feels more energetic.  Discharge Exam:   Vitals:   10/21/21 2110 10/22/21 0814  BP: 108/70 136/90  Pulse: 79 73  Resp: 18 17  Temp: 98 F (36.7 C) 97.7 F (36.5 C)  SpO2: 92% 92%   Vitals:   10/21/21 0814 10/21/21 1519 10/21/21 2110 10/22/21 0814  BP: 123/84 105/72 108/70 136/90  Pulse: 82 73 79 73  Resp: 18 18 18 17   Temp: 98.4 F (36.9 C) 97.9 F (36.6 C) 98 F (36.7 C) 97.7 F (36.5 C)  TempSrc: Oral Oral Oral   SpO2: 92% 91% 92% 92%  Weight:      Height:       General: Alert awake, not in obvious distress, off oxygen HENT: pupils equally reacting to light,  No scleral pallor or icterus noted. Oral mucosa is moist.  Chest:   Diminished breath sounds bilaterally.  No overt wheezing noted. CVS: S1 &S2 heard. No murmur.  Regular rate and rhythm. Abdomen: Soft, nontender, nondistended.  Bowel sounds are heard.   Extremities: No cyanosis, clubbing or edema.  Peripheral pulses are palpable. Psych: Alert, awake and oriented, normal mood CNS:  No cranial nerve deficits.  Power equal in all extremities.   Skin: Warm and dry.  No rashes noted.  The results of significant  diagnostics from this hospitalization (including imaging, microbiology, ancillary and laboratory) are listed below for reference.     Diagnostic Studies:   CT Angio Chest PE W and/or Wo Contrast  Result Date: 10/19/2021 CLINICAL DATA:  Pulmonary embolism. EXAM: CT ANGIOGRAPHY CHEST WITH CONTRAST TECHNIQUE: Multidetector CT imaging of the chest was performed using the standard protocol during bolus administration of intravenous contrast. Multiplanar CT image reconstructions and MIPs were obtained to evaluate the vascular anatomy. CONTRAST:  66mL OMNIPAQUE IOHEXOL 350 MG/ML SOLN COMPARISON:  CT 05/05/2021 FINDINGS: Cardiovascular: No filling defects within the pulmonary arteries to suggest acute pulmonary embolism. No significant vascular findings. Normal heart size. No pericardial effusion. Mediastinum/Nodes: No axillary or supraclavicular adenopathy. No mediastinal or hilar adenopathy. No pericardial fluid. Esophagus normal. Lungs/Pleura: No suspicious pulmonary nodules. Normal pleural. Airways normal. Upper Abdomen: Limited view of the liver, kidneys, pancreas are unremarkable. Normal adrenal glands. Musculoskeletal: No aggressive osseous lesion. 1. No acute pulmonary embolism. 2. No acute pulmonary parenchymal findings. Review of the MIP images confirms the above findings. Electronically Signed   By: Nicole Kindred  Leonia Reeves M.D.   On: 10/19/2021 13:39   DG Chest Portable 1 View  Result Date: 10/19/2021 CLINICAL DATA:  Shortness of breath EXAM: PORTABLE CHEST 1 VIEW COMPARISON:  Chest x-ray dated October 12, 2021; CT chest dated May 01, 2021 FINDINGS: Cardiac and mediastinal contours are within normal limits. Mild lingular opacity favored to be due to scarring when compared with most recent prior CT. No new parenchymal opacity. No evidence of pleural effusion or pneumothorax. IMPRESSION: No active disease. Electronically Signed   By: Yetta Glassman M.D.   On: 10/19/2021 11:43     Labs:   Basic  Metabolic Panel: Recent Labs  Lab 10/19/21 1120 10/20/21 0600 10/21/21 0453 10/22/21 0222  NA 127* 126* 121* 127*  K 4.1 4.6 4.1 4.1  CL 91* 96* 89* 95*  CO2 29 25 29 28   GLUCOSE 102* 111* 94 87  BUN 5* 7* 7* 9  CREATININE 0.48 0.39* 0.49 0.43*  CALCIUM 8.5* 8.1* 7.8* 8.3*  MG  --   --  2.0  --    GFR Estimated Creatinine Clearance: 62.1 mL/min (A) (by C-G formula based on SCr of 0.43 mg/dL (L)). Liver Function Tests: Recent Labs  Lab 10/19/21 1120  AST 15  ALT 11  ALKPHOS 37*  BILITOT 0.6  PROT 6.8  ALBUMIN 4.1   No results for input(s): LIPASE, AMYLASE in the last 168 hours. No results for input(s): AMMONIA in the last 168 hours. Coagulation profile No results for input(s): INR, PROTIME in the last 168 hours.  CBC: Recent Labs  Lab 10/19/21 1120 10/20/21 0600 10/21/21 0453 10/22/21 0222  WBC 5.8 7.3 7.9 7.0  NEUTROABS 3.4  --   --   --   HGB 14.0 12.5 12.0 12.8  HCT 42.1 37.8 35.4* 37.8  MCV 90.3 89.2 89.6 88.3  PLT 301 265 251 252   Cardiac Enzymes: No results for input(s): CKTOTAL, CKMB, CKMBINDEX, TROPONINI in the last 168 hours. BNP: Invalid input(s): POCBNP CBG: No results for input(s): GLUCAP in the last 168 hours. D-Dimer No results for input(s): DDIMER in the last 72 hours. Hgb A1c No results for input(s): HGBA1C in the last 72 hours. Lipid Profile No results for input(s): CHOL, HDL, LDLCALC, TRIG, CHOLHDL, LDLDIRECT in the last 72 hours. Thyroid function studies Recent Labs    10/21/21 0453  TSH 0.864   Anemia work up No results for input(s): VITAMINB12, FOLATE, FERRITIN, TIBC, IRON, RETICCTPCT in the last 72 hours. Microbiology Recent Results (from the past 240 hour(s))  Resp Panel by RT-PCR (Flu A&B, Covid) Nasopharyngeal Swab     Status: None   Collection Time: 10/19/21 11:20 AM   Specimen: Nasopharyngeal Swab; Nasopharyngeal(NP) swabs in vial transport medium  Result Value Ref Range Status   SARS Coronavirus 2 by RT PCR  NEGATIVE NEGATIVE Final    Comment: (NOTE) SARS-CoV-2 target nucleic acids are NOT DETECTED.  The SARS-CoV-2 RNA is generally detectable in upper respiratory specimens during the acute phase of infection. The lowest concentration of SARS-CoV-2 viral copies this assay can detect is 138 copies/mL. A negative result does not preclude SARS-Cov-2 infection and should not be used as the sole basis for treatment or other patient management decisions. A negative result may occur with  improper specimen collection/handling, submission of specimen other than nasopharyngeal swab, presence of viral mutation(s) within the areas targeted by this assay, and inadequate number of viral copies(<138 copies/mL). A negative result must be combined with clinical observations, patient history, and epidemiological information. The  expected result is Negative.  Fact Sheet for Patients:  EntrepreneurPulse.com.au  Fact Sheet for Healthcare Providers:  IncredibleEmployment.be  This test is no t yet approved or cleared by the [place] FDA and  has been authorized for detection and/or diagnosis of SARS-CoV-2 by FDA under an Emergency Use Authorization (EUA). This EUA will remain  in effect (meaning this test can be used) for the duration of the COVID-19 declaration under Section 564(b)(1) of the Act, 21 U.S.C.section 360bbb-3(b)(1), unless the authorization is terminated  or revoked sooner.       Influenza A by PCR NEGATIVE NEGATIVE Final   Influenza B by PCR NEGATIVE NEGATIVE Final    Comment: (NOTE) The Xpert Xpress SARS-CoV-2/FLU/RSV plus assay is intended as an aid in the diagnosis of influenza from Nasopharyngeal swab specimens and should not be used as a sole basis for treatment. Nasal washings and aspirates are unacceptable for Xpert Xpress SARS-CoV-2/FLU/RSV testing.  Fact Sheet for Patients: EntrepreneurPulse.com.au  Fact Sheet for  Healthcare Providers: IncredibleEmployment.be  This test is not yet approved or cleared by the [place] FDA and has been authorized for detection and/or diagnosis of SARS-CoV-2 by FDA under an Emergency Use Authorization (EUA). This EUA will remain in effect (meaning this test can be used) for the duration of the COVID-19 declaration under Section 564(b)(1) of the Act, 21 U.S.C. section 360bbb-3(b)(1), unless the authorization is terminated or revoked.  Performed at [facility], [address] [place], [place] [address]      Discharge Instructions:   Discharge Instructions     Diet - low sodium heart healthy   Complete by: As directed    Discharge instructions   Complete by: As directed    Follow-up with your primary care provider in 1 week.  Check blood work in the next visit. Seek medical attention for worsening symptoms. Use oxygen at home during night. Follow up with your psychiatry Dr Amalia Hailey to discuss about medications.   Increase activity slowly   Complete by: As directed       Allergies as of 10/22/2021       Reactions   Desvenlafaxine Anaphylaxis   Morphine And Related Anaphylaxis   Penicillins Anaphylaxis   Has patient had a PCN reaction causing immediate rash, facial/tongue/throat swelling, SOB or lightheadedness with hypotension: Yes Has patient had a PCN reaction causing severe rash involving mucus membranes or skin necrosis: No Has patient had a PCN reaction that required hospitalization: Unknown Has patient had a PCN reaction occurring within the last 10 years: No If all of the above answers are "NO", then may proceed with Cephalosporin use.   Prazosin Other (See Comments)   Tamoxifen Anaphylaxis   Trazodone Anaphylaxis   Clonazepam    Codeine Swelling   Hydroxyzine Itching   Lorazepam    Patient feels this medications makes her to sedated and wants to avoid   Paroxetine Hcl Hives   Sulfa Antibiotics Itching    Cephalexin Rash        Medication List     STOP taking these medications    diphenhydrAMINE 25 MG tablet Commonly known as: BENADRYL       TAKE these medications    acetaminophen 500 MG tablet Commonly known as: TYLENOL Take 500 mg by mouth every 6 (six) hours as needed for headache, fever, moderate pain or mild pain.   Cholecalciferol 25 MCG (1000 UT) tablet Take by mouth.   denosumab 60 MG/ML Sosy injection Commonly known as: PROLIA Inject 60  mg into the skin every 6 (six) months.   DULoxetine 60 MG capsule Commonly known as: CYMBALTA Take 1 capsule (60 mg total) by mouth 2 (two) times daily.   EPINEPHrine 0.3 mg/0.3 mL Soaj injection Commonly known as: EPI-PEN Inject 0.3 mg into the muscle as needed.   fluticasone 50 MCG/ACT nasal spray Commonly known as: FLONASE Place 2 sprays into both nostrils daily. Start taking on: October 23, 2021   gabapentin 300 MG capsule Commonly known as: Neurontin Take 1-2 capsules (300-600 mg total) by mouth as directed. Take 1 capsule daily AM and 2 capsules daily at bedtime   Icy Hot 5 % Ptch Generic drug: Menthol Apply 1 patch topically daily as needed.   ipratropium 0.02 % nebulizer solution Commonly known as: ATROVENT Take by nebulization 4 (four) times daily.   lidocaine 5 % Commonly known as: LIDODERM Place 1 patch onto the skin daily. Remove & Discard patch within 12 hours or as directed by MD   predniSONE 20 MG tablet Commonly known as: DELTASONE Take 2 tablets (40 mg total) by mouth daily with breakfast for 3 days.   QUEtiapine 50 MG tablet Commonly known as: SEROquel Take 1 tablet (50 mg total) by mouth at bedtime. What changed:  medication strength how much to take   sodium chloride 1 g tablet Take 1 tablet (1 g total) by mouth 2 (two) times daily with a meal for 5 days.   tiZANidine 4 MG tablet Commonly known as: Zanaflex Take 1 tablet (4 mg total) by mouth every 12 (twelve) hours as needed for  muscle spasms.   Trelegy Ellipta 100-62.5-25 MCG/ACT Aepb Generic drug: Fluticasone-Umeclidin-Vilant Inhale 1 puff into the lungs daily.   Ventolin HFA 108 (90 Base) MCG/ACT inhaler Generic drug: albuterol Inhale 1-2 puffs into the lungs every 4 (four) hours as needed for cough or wheezing.          Follow-up Information     Remi Haggard, FNP Follow up in 1 week(s).   Specialty: Family Medicine Contact information: [place] [phone] [phone]                  Time coordinating discharge: 39 minutes  Signed:  Lamon Rotundo  [facility] 10/22/2021, 8:32 AM

## 2021-10-22 NOTE — Progress Notes (Signed)
Patient sats at rest 90% on room air. Patient ambulated with O2 sats taken sats 86% on room air with ambulation.

## 2021-10-22 NOTE — Progress Notes (Signed)
Patient has chronic respiratory failure secondary to COPD. Order non invasive ventilator to sustain life and decrease hospitalizations.

## 2021-10-23 ENCOUNTER — Other Ambulatory Visit: Payer: Self-pay

## 2021-10-23 ENCOUNTER — Ambulatory Visit: Admitting: Student in an Organized Health Care Education/Training Program

## 2021-10-23 ENCOUNTER — Telehealth (INDEPENDENT_AMBULATORY_CARE_PROVIDER_SITE_OTHER): Admitting: Psychiatry

## 2021-10-23 ENCOUNTER — Encounter: Payer: Self-pay | Admitting: Psychiatry

## 2021-10-23 DIAGNOSIS — F17200 Nicotine dependence, unspecified, uncomplicated: Secondary | ICD-10-CM

## 2021-10-23 DIAGNOSIS — Z634 Disappearance and death of family member: Secondary | ICD-10-CM | POA: Insufficient documentation

## 2021-10-23 DIAGNOSIS — G4701 Insomnia due to medical condition: Secondary | ICD-10-CM

## 2021-10-23 DIAGNOSIS — F411 Generalized anxiety disorder: Secondary | ICD-10-CM | POA: Diagnosis not present

## 2021-10-23 DIAGNOSIS — F4310 Post-traumatic stress disorder, unspecified: Secondary | ICD-10-CM

## 2021-10-23 DIAGNOSIS — F3342 Major depressive disorder, recurrent, in full remission: Secondary | ICD-10-CM | POA: Diagnosis not present

## 2021-10-23 NOTE — Progress Notes (Signed)
Virtual Visit via Video Note  I connected with Colleen [name] on 10/23/21 at 10:30 AM EST by a video enabled telemedicine application and verified that I am speaking with the correct person using two identifiers.  Location Provider Location : [facility] Patient Location : Home  Participants: Patient , Provider   I discussed the limitations of evaluation and management by telemedicine and the availability of in person appointments. The patient expressed understanding and agreed to proceed   I discussed the assessment and treatment plan with the patient. The patient was provided an opportunity to ask questions and all were answered. The patient agreed with the plan and demonstrated an understanding of the instructions.   The patient was advised to call back or seek an in-person evaluation if the symptoms worsen or if the condition fails to improve as anticipated.  [facility] MD OP Progress Note  10/23/2021 12:33 PM Colleen [name]  MRN:  [id number]  Chief Complaint:  Chief Complaint   Follow-up; Depression; Anxiety    HPI: Colleen [name] is a 60 year old Caucasian female, married, lives in [place], has a history of PTSD, MDD, insomnia, chronic back pain, COPD, radiation therapy per history, surgical history of nephrectomy, mastectomy, tubal ligation was evaluated by telemedicine today.  Patient recently with COPD exacerbation was admitted to medical unit dated 10/19/2021-discharged on 10/22/2021.  Reviewed notes per Dr.Pokhrel-patient received IV and inhaled steroid bronchodilators.  Patient had BNP of 9.3.  Troponins were negative.  Patient would benefit from noninvasive ventilation at home to reduce her risk of recurrent admission and COPD flareup.  Patient will be discharged home on prednisone.'  Patient today reports she stopped taking the Cymbalta a week ago.  She reports she noticed itching as well as rash from the Cymbalta.  She reports she has had it for a very long time and never realized  that Cymbalta could be contributing to it and that is why she never mentioned this to Probation officer previously.  She reports since stopping the Cymbalta she has not had any itching.  She does not want to go back on any other antidepressants at this time.  She does report feeling anxious about her multiple medical problems.  She also reports there has been a death in her family-her aunt passed away recently.  She is currently grieving her loss.  Patient however reports she is not interested in changing her medications and would like to just pursue psychotherapy.  She will reach out to her therapist.  Patient denies any suicidality, homicidality or perceptual disturbances.  Patient reports sleep is overall okay.  Patient denies any other concerns today.  Visit Diagnosis:    ICD-10-CM   1. MDD (major depressive disorder), recurrent, in full remission ([place])  F33.42     2. GAD (generalized anxiety disorder)  F41.1     3. PTSD (post-traumatic stress disorder)  F43.10     4. Insomnia due to medical condition  G47.01    copd    5. Tobacco use disorder  F17.200     6. Bereavement  Z63.4       Past Psychiatric History: Reviewed past psychiatric history from progress note on 03/04/2021.  Past trials of Paxil, Klonopin, trazodone, prazosin, desvenlafaxine, lorazepam  Past Medical History:  Past Medical History:  Diagnosis Date   Anxiety    Asthma    Breast cancer ([place]) 2004   left breast   Chronic back pain    COPD (chronic obstructive pulmonary disease) ([place])    Depression  Personal history of radiation therapy     Past Surgical History:  Procedure Laterality Date   BREAST BIOPSY Left 2004   positive   BREAST BIOPSY Right    neg   BREAST LUMPECTOMY Left 2004   positive   MASTECTOMY Left    MASTECTOMY     NEPHRECTOMY Left    TUBAL LIGATION      Family Psychiatric History: Reviewed family psychiatric history from progress note on 03/04/2021  Family History:  Family History   Problem Relation Age of Onset   Breast cancer Cousin    Breast cancer Cousin    Bipolar disorder Daughter    Drug abuse Daughter    Alcohol abuse Maternal Grandfather     Social History: Reviewed social history from progress note on 03/04/2021 Social History   Socioeconomic History   Marital status: Married    Spouse name: Not on file   Number of children: 2   Years of education: GED   Highest education level: Not on file  Occupational History   Not on file  Tobacco Use   Smoking status: Every Day    Packs/day: 0.50    Types: Cigarettes   Smokeless tobacco: Never  Vaping Use   Vaping Use: Never used  Substance and Sexual Activity   Alcohol use: Yes   Drug use: Never   Sexual activity: Not Currently  Other Topics Concern   Not on file  Social History Narrative   Not on file   Social Determinants of Health   Financial Resource Strain: Not on file  Food Insecurity: Not on file  Transportation Needs: Not on file  Physical Activity: Not on file  Stress: Not on file  Social Connections: Not on file    Allergies:  Allergies  Allergen Reactions   Desvenlafaxine Anaphylaxis   Morphine And Related Anaphylaxis   Penicillins Anaphylaxis    Has patient had a PCN reaction causing immediate rash, facial/tongue/throat swelling, SOB or lightheadedness with hypotension: Yes Has patient had a PCN reaction causing severe rash involving mucus membranes or skin necrosis: No Has patient had a PCN reaction that required hospitalization: Unknown Has patient had a PCN reaction occurring within the last 10 years: No If all of the above answers are "NO", then may proceed with Cephalosporin use.    Prazosin Other (See Comments)   Tamoxifen Anaphylaxis   Trazodone Anaphylaxis   Clonazepam    Codeine Swelling   Duloxetine Itching   Hydroxyzine Itching   Lorazepam     Patient feels this medications makes her to sedated and wants to avoid   Paroxetine Hcl Hives   Sulfa Antibiotics  Itching   Cephalexin Rash    Metabolic Disorder Labs: Lab Results  Component Value Date   HGBA1C 5.4 10/12/2018   MPG 108.28 10/12/2018   No results found for: PROLACTIN No results found for: CHOL, TRIG, HDL, CHOLHDL, VLDL, LDLCALC Lab Results  Component Value Date   TSH 0.864 10/21/2021   TSH 0.616 03/05/2021    Therapeutic Level Labs: No results found for: LITHIUM No results found for: VALPROATE No components found for:  CBMZ  Current Medications: Current Outpatient Medications  Medication Sig Dispense Refill   acetaminophen (TYLENOL) 500 MG tablet Take 500 mg by mouth every 6 (six) hours as needed for headache, fever, moderate pain or mild pain.     Cholecalciferol 25 MCG (1000 UT) tablet Take by mouth.     denosumab (PROLIA) 60 MG/ML SOSY injection Inject 60 mg into  the skin every 6 (six) months.     EPINEPHrine 0.3 mg/0.3 mL IJ SOAJ injection Inject 0.3 mg into the muscle as needed.     fluticasone (FLONASE) 50 MCG/ACT nasal spray Place 2 sprays into both nostrils daily. 9.9 mL 0   Fluticasone-Umeclidin-Vilant (TRELEGY ELLIPTA) 100-62.5-25 MCG/INH AEPB Inhale 1 puff into the lungs daily.     gabapentin (NEURONTIN) 300 MG capsule Take 1-2 capsules (300-600 mg total) by mouth as directed. Take 1 capsule daily AM and 2 capsules daily at bedtime 270 capsule 0   ipratropium (ATROVENT) 0.02 % nebulizer solution Take by nebulization 4 (four) times daily.     lidocaine (LIDODERM) 5 % Place 1 patch onto the skin daily. Remove & Discard patch within 12 hours or as directed by MD 30 patch 0   Menthol (ICY HOT) 5 % PTCH Apply 1 patch topically daily as needed.     predniSONE (DELTASONE) 20 MG tablet Take 2 tablets (40 mg total) by mouth daily with breakfast for 3 days. 6 tablet 0   QUEtiapine (SEROQUEL) 50 MG tablet Take 1 tablet (50 mg total) by mouth at bedtime. 30 tablet 0   sodium chloride 1 g tablet Take 1 tablet (1 g total) by mouth 2 (two) times daily with a meal for 5 days. 10  tablet 0   tiZANidine (ZANAFLEX) 4 MG tablet Take 1 tablet (4 mg total) by mouth every 12 (twelve) hours as needed for muscle spasms. 60 tablet 0   VENTOLIN HFA 108 (90 Base) MCG/ACT inhaler Inhale 1-2 puffs into the lungs every 4 (four) hours as needed for cough or wheezing.     No current facility-administered medications for this visit.     Musculoskeletal: Strength & Muscle Tone: UTA Gait & Station:  Seated Patient leans: N/A  Psychiatric Specialty Exam: Review of Systems  Respiratory:  Positive for shortness of breath.   Psychiatric/Behavioral:  The patient is nervous/anxious.        Grieving  All other systems reviewed and are negative.  There were no vitals taken for this visit.There is no height or weight on file to calculate BMI.  General Appearance: Casual  Eye Contact:  Fair  Speech:  Clear and Coherent  Volume:  Normal  Mood:  Anxious, grieving  Affect:  Appropriate  Thought Process:  Goal Directed and Descriptions of Associations: Intact  Orientation:  Full (Time, Place, and Person)  Thought Content: Logical   Suicidal Thoughts:  No  Homicidal Thoughts:  No  Memory:  Immediate;   Fair Recent;   Fair Remote;   Fair  Judgement:  Fair  Insight:  Fair  Psychomotor Activity:  Normal  Concentration:  Concentration: Fair and Attention Span: Fair  Recall:  [employer] of Knowledge: Fair  Language: Fair  Akathisia:  No  Handed:  Right  AIMS (if indicated): not done  Assets:  Communication Skills Desire for [place] Talents/Skills  ADL's:  Intact  Cognition: WNL  Sleep:   improving   Screenings: GAD-7    Flowsheet Row Video Visit from 10/23/2021 in [facility] Video Visit from 06/19/2021 in [facility] Video Visit from 04/03/2021 in [facility] [place] [facility] Video Visit from 03/04/2021 in [facility]  Total GAD-7 Score 2 3 6 17        PHQ2-9    Flowsheet Row Video Visit from 10/23/2021 in [facility] Video Visit from 06/19/2021 in [facility] Procedure  visit from 06/17/2021 in [facility] Video Visit from 06/05/2021 in [facility] Video Visit from 04/03/2021 in [facility]  PHQ-2 Total Score 0 1 2 6  0  PHQ-9 Total Score -- 2 5 13  --      Flowsheet Row Video Visit from 10/23/2021 in [facility] to Hosp-Admission (Discharged) from 10/19/2021 in [facility] from 10/12/2021 in [facility] CATEGORY Moderate Risk No Risk No Risk        Assessment and Plan: Colleen [name] is a 60 year old Caucasian female, unemployed, married, has a history of anxiety, MDD, PTSD, chronic pain, multiple medical problems was evaluated by telemedicine today.  Patient with recent discharge from hospital after COPD exacerbation.  Patient also noncompliant with Cymbalta due to possible side effects.  Patient however reports mood symptoms as currently stable and is interested in pursuing psychotherapy, declines medication changes.  Discussed plan as noted below.  Plan MDD in remission Discontinue Cymbalta for noncompliance as well as possible side effects. Patient currently denies any withdrawal symptoms. Seroquel 50 mg p.o. nightly Continue CBT with Ms. Debbie Marylou Mccoy  GAD-stable Gabapentin 300 mg p.o. daily in the morning and 600 mg p.o. nightly Seroquel 50 mg p.o. nightly Continue CBT  Insomnia-improving Continue Seroquel 50 mg p.o. nightly Continue gabapentin as prescribed  Tobacco use disorder-improving Provided counseling for 2 minutes.  Bereavement-unstable Patient to continue psychotherapy with her therapist.  I have reviewed labs-most recent BMP dated  10/22/2021-sodium-127-patient is currently on sodium replacement.  We will monitor closely  I have reviewed discharge summary from most recent hospitalization as noted above.  I have reviewed genetic testing report-discussed with patient that testing report which was sent over was incomplete.  Follow-up in clinic in 3 weeks or sooner.  This note was generated in part or whole with voice recognition software. Voice recognition is usually quite accurate but there are transcription errors that can and very often do occur. I apologize for any typographical errors that were not detected and corrected.      Ursula Alert, MD 10/23/2021, 12:33 PM

## 2021-10-28 ENCOUNTER — Telehealth: Admitting: Psychiatry

## 2021-11-04 ENCOUNTER — Inpatient Hospital Stay
Admission: EM | Admit: 2021-11-04 | Discharge: 2021-11-06 | DRG: 287 | Disposition: A | Discharge status: Home / Self Care | Attending: Internal Medicine | Admitting: Internal Medicine

## 2021-11-04 ENCOUNTER — Other Ambulatory Visit: Payer: Self-pay

## 2021-11-04 ENCOUNTER — Emergency Department

## 2021-11-04 DIAGNOSIS — Z87892 Personal history of anaphylaxis: Secondary | ICD-10-CM

## 2021-11-04 DIAGNOSIS — M810 Age-related osteoporosis without current pathological fracture: Secondary | ICD-10-CM | POA: Diagnosis present

## 2021-11-04 DIAGNOSIS — J441 Chronic obstructive pulmonary disease with (acute) exacerbation: Secondary | ICD-10-CM | POA: Diagnosis not present

## 2021-11-04 DIAGNOSIS — Z905 Acquired absence of kidney: Secondary | ICD-10-CM | POA: Diagnosis not present

## 2021-11-04 DIAGNOSIS — J9611 Chronic respiratory failure with hypoxia: Secondary | ICD-10-CM | POA: Diagnosis present

## 2021-11-04 DIAGNOSIS — Z7951 Long term (current) use of inhaled steroids: Secondary | ICD-10-CM

## 2021-11-04 DIAGNOSIS — Z881 Allergy status to other antibiotic agents status: Secondary | ICD-10-CM

## 2021-11-04 DIAGNOSIS — F17210 Nicotine dependence, cigarettes, uncomplicated: Secondary | ICD-10-CM | POA: Diagnosis present

## 2021-11-04 DIAGNOSIS — Z923 Personal history of irradiation: Secondary | ICD-10-CM

## 2021-11-04 DIAGNOSIS — F339 Major depressive disorder, recurrent, unspecified: Secondary | ICD-10-CM | POA: Diagnosis present

## 2021-11-04 DIAGNOSIS — G894 Chronic pain syndrome: Secondary | ICD-10-CM | POA: Diagnosis present

## 2021-11-04 DIAGNOSIS — Z9981 Dependence on supplemental oxygen: Secondary | ICD-10-CM | POA: Diagnosis not present

## 2021-11-04 DIAGNOSIS — F419 Anxiety disorder, unspecified: Secondary | ICD-10-CM | POA: Diagnosis present

## 2021-11-04 DIAGNOSIS — I25118 Atherosclerotic heart disease of native coronary artery with other forms of angina pectoris: Principal | ICD-10-CM | POA: Diagnosis present

## 2021-11-04 DIAGNOSIS — Z803 Family history of malignant neoplasm of breast: Secondary | ICD-10-CM

## 2021-11-04 DIAGNOSIS — J449 Chronic obstructive pulmonary disease, unspecified: Secondary | ICD-10-CM

## 2021-11-04 DIAGNOSIS — Z853 Personal history of malignant neoplasm of breast: Secondary | ICD-10-CM | POA: Diagnosis not present

## 2021-11-04 DIAGNOSIS — Z79899 Other long term (current) drug therapy: Secondary | ICD-10-CM | POA: Diagnosis not present

## 2021-11-04 DIAGNOSIS — Z885 Allergy status to narcotic agent status: Secondary | ICD-10-CM

## 2021-11-04 DIAGNOSIS — Z20822 Contact with and (suspected) exposure to covid-19: Secondary | ICD-10-CM | POA: Diagnosis present

## 2021-11-04 DIAGNOSIS — Z8249 Family history of ischemic heart disease and other diseases of the circulatory system: Secondary | ICD-10-CM

## 2021-11-04 DIAGNOSIS — F411 Generalized anxiety disorder: Secondary | ICD-10-CM | POA: Diagnosis not present

## 2021-11-04 DIAGNOSIS — I214 Non-ST elevation (NSTEMI) myocardial infarction: Secondary | ICD-10-CM | POA: Diagnosis present

## 2021-11-04 DIAGNOSIS — Z888 Allergy status to other drugs, medicaments and biological substances status: Secondary | ICD-10-CM

## 2021-11-04 DIAGNOSIS — R778 Other specified abnormalities of plasma proteins: Secondary | ICD-10-CM | POA: Diagnosis not present

## 2021-11-04 DIAGNOSIS — Z9012 Acquired absence of left breast and nipple: Secondary | ICD-10-CM | POA: Diagnosis not present

## 2021-11-04 DIAGNOSIS — Z882 Allergy status to sulfonamides status: Secondary | ICD-10-CM

## 2021-11-04 DIAGNOSIS — Z880 Allergy status to penicillin: Secondary | ICD-10-CM

## 2021-11-04 DIAGNOSIS — E785 Hyperlipidemia, unspecified: Secondary | ICD-10-CM | POA: Diagnosis present

## 2021-11-04 DIAGNOSIS — R0602 Shortness of breath: Secondary | ICD-10-CM | POA: Diagnosis present

## 2021-11-04 LAB — BASIC METABOLIC PANEL
Anion gap: 4 — ABNORMAL LOW (ref 5–15)
BUN: 7 mg/dL — ABNORMAL LOW (ref 8–23)
CO2: 31 mmol/L (ref 22–32)
Calcium: 8.5 mg/dL — ABNORMAL LOW (ref 8.9–10.3)
Chloride: 98 mmol/L (ref 98–111)
Creatinine, Ser: 0.54 mg/dL (ref 0.44–1.00)
GFR, Estimated: >60 mL/min (ref >60)
Glucose, Bld: 130 mg/dL — ABNORMAL HIGH (ref 70–99)
Potassium: 3.6 mmol/L (ref 3.5–5.1)
Sodium: 133 mmol/L — ABNORMAL LOW (ref 135–145)

## 2021-11-04 LAB — CBC
HCT: 37.3 % (ref 36.0–46.0)
Hemoglobin: 12.3 g/dL (ref 12.0–15.0)
MCH: 30.1 pg (ref 26.0–34.0)
MCHC: 33 g/dL (ref 30.0–36.0)
MCV: 91.2 fL (ref 80.0–100.0)
Platelets: 285 10*3/uL (ref 150–400)
RBC: 4.09 MIL/uL (ref 3.87–5.11)
RDW: 13.4 % (ref 11.5–15.5)
WBC: 6.5 10*3/uL (ref 4.0–10.5)
nRBC: 0 % (ref 0.0–0.2)

## 2021-11-04 LAB — TROPONIN I (HIGH SENSITIVITY)
Troponin I (High Sensitivity): 6 ng/L (ref <18)
Troponin I (High Sensitivity): 239 ng/L (ref <18)

## 2021-11-04 MED ORDER — GABAPENTIN 300 MG PO CAPS
600.0000 mg | ORAL_CAPSULE | ORAL | Status: AC
  Administered 2021-11-05: 600 mg via ORAL
  Filled 2021-11-04: qty 2

## 2021-11-04 MED ORDER — ASPIRIN 81 MG PO CHEW
324.0000 mg | CHEWABLE_TABLET | Freq: Once | ORAL | Status: AC
  Administered 2021-11-05: 324 mg via ORAL
  Filled 2021-11-04: qty 4

## 2021-11-04 NOTE — ED Triage Notes (Signed)
Pt c/o chest pain with increased SOB over the past week pt is on 2L [place] at home. Pt is able to speak in complete sentences, in NAD

## 2021-11-04 NOTE — ED Provider Notes (Signed)
[facility] Emergency Department Provider Note  ____________________________________________   Event Date/Time   First MD Initiated Contact with Patient 11/04/21 2340     (approximate)  I have reviewed the triage vital signs and the nursing notes.   HISTORY  Chief Complaint Shortness of Breath and Chest Pain    HPI Colleen [name] is a 60 y.o. female with medical history as listed below but no known history of heart disease.  She uses 2 L of oxygen at night for COPD.  She presents for episodic chest pressure over about the last week but persistent and severe chest heaviness/pressure that radiates through to her back that started today acutely and nothing in particular seems to make it better or worse.  She occasionally feels short of breath but that is not the primary issue.  She has a family history of heart disease and heart attacks but no personal history.  She was hospitalized about 2 weeks ago for COPD exacerbation but this discomfort is relatively new.  She continues to smoke.  She is also concerned about her back pain that is typically relieved with gabapentin.  She reports that she has numerous medication allergies including anaphylaxis to morphine.  Symptoms are severe.     Past Medical History:  Diagnosis Date   Anxiety    Asthma    Breast cancer ([place]) 2004   left breast   Chronic back pain    COPD (chronic obstructive pulmonary disease) ([place])    Depression    Personal history of radiation therapy     Patient Active Problem List   Diagnosis Date Noted   NSTEMI (non-ST elevated myocardial infarction) ([place]) 11/05/2021   Chronic respiratory failure with hypoxia ([place]) 11/05/2021   Bereavement 10/23/2021   Acute exacerbation of chronic obstructive pulmonary disease (COPD) ([place]) 10/20/2021   COPD with acute exacerbation ([place]) 10/19/2021   Hyponatremia 10/19/2021   MDD (major depressive disorder), recurrent, in full remission ([place]) 07/29/2021    Tobacco use disorder 06/19/2021   Insomnia due to medical condition 03/21/2021   MDD (major depressive disorder), recurrent episode, moderate ([place]) 03/04/2021   PTSD (post-traumatic stress disorder) 03/04/2021   Myofascial pain syndrome 07/09/2020   Lumbar radiculopathy 07/09/2020   Chronic pain syndrome 06/04/2020   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, L5/S1) 06/04/2020   Lumbar facet arthropathy (L4,5 S1) 06/04/2020   Cervical facet joint syndrome 06/04/2020   Osteopenia of multiple sites 06/04/2020   Age related osteoporosis 06/04/2020   Low back pain 04/27/2020   COPD exacerbation ([place]) 03/20/2020   Asthma, chronic, unspecified asthma severity, with acute exacerbation 03/20/2020   Panic attack 03/20/2020   Acute on chronic respiratory failure with hypoxia ([place])    Sepsis ([place]) 10/11/2018   CAP (community acquired pneumonia) 10/11/2018   COPD (chronic obstructive pulmonary disease) ([place]) 10/11/2018   GAD (generalized anxiety disorder) 10/11/2018    Past Surgical History:  Procedure Laterality Date   BREAST BIOPSY Left 2004   positive   BREAST BIOPSY Right    neg   BREAST LUMPECTOMY Left 2004   positive   MASTECTOMY Left    MASTECTOMY     NEPHRECTOMY Left    TUBAL LIGATION      Prior to Admission medications   Medication Sig Start Date End Date Taking? Authorizing Provider  acetaminophen (TYLENOL) 500 MG tablet Take 500 mg by mouth every 6 (six) hours as needed for headache, fever, moderate pain or mild pain.    [provider]  Cholecalciferol 25  MCG (1000 UT) tablet Take by mouth.    [provider]  denosumab (PROLIA) 60 MG/ML SOSY injection Inject 60 mg into the skin every 6 (six) months.    [provider]  EPINEPHrine 0.3 mg/0.3 mL IJ SOAJ injection Inject 0.3 mg into the muscle as needed.    [provider]  fluticasone (FLONASE) 50 MCG/ACT nasal spray Place 2 sprays into both nostrils daily. 10/23/21 11/22/21  Pokhrel, Corrie Mckusick, MD   Fluticasone-Umeclidin-Vilant (TRELEGY ELLIPTA) 100-62.5-25 MCG/INH AEPB Inhale 1 puff into the lungs daily.    [provider]  gabapentin (NEURONTIN) 300 MG capsule Take 1-2 capsules (300-600 mg total) by mouth as directed. Take 1 capsule daily AM and 2 capsules daily at bedtime 09/23/21   Ursula Alert, MD  ipratropium (ATROVENT) 0.02 % nebulizer solution Take by nebulization 4 (four) times daily. 03/26/21   [provider]  lidocaine (LIDODERM) 5 % Place 1 patch onto the skin daily. Remove & Discard patch within 12 hours or as directed by MD 10/22/21   Pokhrel, Corrie Mckusick, MD  Menthol (ICY HOT) 5 % PTCH Apply 1 patch topically daily as needed.    [provider]  QUEtiapine (SEROQUEL) 50 MG tablet Take 1 tablet (50 mg total) by mouth at bedtime. 10/22/21 11/21/21  Pokhrel, Corrie Mckusick, MD  tiZANidine (ZANAFLEX) 4 MG tablet Take 1 tablet (4 mg total) by mouth every 12 (twelve) hours as needed for muscle spasms. 10/09/21 11/08/21  Gillis Santa, MD  VENTOLIN HFA 108 (90 Base) MCG/ACT inhaler Inhale 1-2 puffs into the lungs every 4 (four) hours as needed for cough or wheezing. 10/02/18   [provider]    Allergies Desvenlafaxine, Morphine and related, Penicillins, Prazosin, Tamoxifen, Trazodone, Clonazepam, Codeine, Duloxetine, Hydroxyzine, Lorazepam, Paroxetine hcl, Sulfa antibiotics, and Cephalexin  Family History  Problem Relation Age of Onset   Breast cancer Cousin    Breast cancer Cousin    Bipolar disorder Daughter    Drug abuse Daughter    Alcohol abuse Maternal Grandfather     Social History Social History   Tobacco Use   Smoking status: Every Day    Packs/day: 0.50    Types: Cigarettes   Smokeless tobacco: Never  Vaping Use   Vaping Use: Never used  Substance Use Topics   Alcohol use: Yes   Drug use: Never    Review of Systems Constitutional: No fever/chills Eyes: No visual changes. ENT: No sore throat. Cardiovascular: Positive for  chest pressure. Respiratory: Intermittent shortness of breath. Gastrointestinal: No abdominal pain.  No nausea, no vomiting.  No diarrhea.  No constipation. Genitourinary: Negative for dysuria. Musculoskeletal: Negative for neck pain.  Negative for back pain. Integumentary: Negative for rash. Neurological: Negative for headaches, focal weakness or numbness.   ____________________________________________   PHYSICAL EXAM:  VITAL SIGNS: ED Triage Vitals  Enc Vitals Group     BP 11/04/21 1851 117/79     Pulse Rate 11/04/21 1851 (!) 104     Resp 11/04/21 1851 20     Temp 11/04/21 2149 98.6 F (37 C)     Temp Source 11/04/21 2149 Oral     SpO2 11/04/21 1851 90 %     Weight --      Height --      Head Circumference --      Peak Flow --      Pain Score --      Pain Loc --      Pain Edu? --  Excl. in [place]? --     Constitutional: Alert and oriented.  Appears uncomfortable. Eyes: Conjunctivae are normal.  Head: Atraumatic. Nose: No congestion/rhinnorhea. Mouth/Throat: Patient is wearing a mask. Neck: No stridor.  No meningeal signs.   Cardiovascular: Normal rate, regular rhythm. Good peripheral circulation. Respiratory: Normal respiratory effort.  No retractions.  Patient is on 2 L of oxygen by nasal cannula. Gastrointestinal: Soft and nontender. No distention.  Musculoskeletal: No lower extremity tenderness nor edema. No gross deformities of extremities. Neurologic:  Normal speech and language. No gross focal neurologic deficits are appreciated.  Skin:  Skin is warm, dry and intact. Psychiatric: Mood and affect are anxious.  ____________________________________________   LABS (all labs ordered are listed, but only abnormal results are displayed)  Labs Reviewed  BASIC METABOLIC PANEL - Abnormal; Notable for the following components:      Result Value   Sodium 133 (*)    Glucose, Bld 130 (*)    BUN 7 (*)    Calcium 8.5 (*)    Anion gap 4 (*)    All other components  within normal limits  TROPONIN I (HIGH SENSITIVITY) - Abnormal; Notable for the following components:   Troponin I (High Sensitivity) 239 (*)    All other components within normal limits  RESP PANEL BY RT-PCR (FLU A&B, COVID) ARPGX2  CBC  APTT  PROTIME-INR  HIV ANTIBODY (ROUTINE TESTING W REFLEX)  LIPID PANEL  BASIC METABOLIC PANEL  CBC  TROPONIN I (HIGH SENSITIVITY)   ____________________________________________  EKG  ED ECG REPORT I, Hinda Kehr, the attending physician, personally viewed and interpreted this ECG.  Date: 11/04/2021 EKG Time: 15: 57 Rate: 108 Rhythm: Sinus tachycardia QRS Axis: normal Intervals: normal ST/T Wave abnormalities: Non-specific ST segment / T-wave changes, but no clear evidence of acute ischemia. Narrative Interpretation: no definitive evidence of acute ischemia; does not meet STEMI criteria.  ____________________________________________  RADIOLOGY I, Hinda Kehr, personally viewed and evaluated these images (plain radiographs) as part of my medical decision making, as well as reviewing the written report by the radiologist.  ED MD interpretation: No acute abnormalities, chronic COPD changes  Official radiology report(s): DG Chest 2 View  Result Date: 11/04/2021 CLINICAL DATA:  Chest pain, shortness of breath increased over 1 week, asthma, COPD, smoker EXAM: CHEST - 2 VIEW COMPARISON:  10/19/2021 FINDINGS: Normal heart size, mediastinal contours, and pulmonary vascularity. Emphysematous and bronchitic changes consistent with COPD. Chronic accentuation of basilar markings. No definite acute infiltrate, pleural effusion, or pneumothorax. No acute osseous findings. IMPRESSION: COPD changes without acute abnormalities. Electronically Signed   By: Lavonia Dana M.D.   On: 11/04/2021 16:28    ____________________________________________   PROCEDURES   Procedure(s) performed (including Critical Care):  .1-3 Lead EKG Interpretation Performed  by: Hinda Kehr, MD Authorized by: Hinda Kehr, MD     Interpretation: abnormal     ECG rate:  105   ECG rate assessment: tachycardic     Rhythm: sinus tachycardia     Ectopy: none     Conduction: normal   .Critical Care Performed by: Hinda Kehr, MD Authorized by: Hinda Kehr, MD   Critical care provider statement:    Critical care time (minutes):  30   Critical care time was exclusive of:  Separately billable procedures and treating other patients   Critical care was necessary to treat or prevent imminent or life-threatening deterioration of the following conditions:  Circulatory failure   Critical care was time spent personally by me on the  following activities:  Development of treatment plan with patient or surrogate, evaluation of patient's response to treatment, examination of patient, obtaining history from patient or surrogate, ordering and performing treatments and interventions, ordering and review of laboratory studies, ordering and review of radiographic studies, pulse oximetry, re-evaluation of patient's condition and review of old charts   ____________________________________________   INITIAL IMPRESSION / MDM / [place] / ED COURSE  As part of my medical decision making, I reviewed the following data within the [facility] notes reviewed and incorporated, Labs reviewed , EKG interpreted , Old chart reviewed, Radiograph reviewed , Discussed with admitting physician (Dr. Damita Dunnings), and Notes from prior ED visits   Differential diagnosis includes, but is not limited to, ACS, COPD exacerbation, PE, AAS.  The patient is on the cardiac monitor to evaluate for evidence of arrhythmia and/or significant heart rate changes.  Nonischemic EKG with mild tachycardia.  Vital signs are stable and within normal limits.  Basic metabolic panel is essentially normal other than a very slightly decreased sodium.  Respiratory viral panel is negative.   CBC is normal. The patient's initial high-sensitivity troponin was 6 but unfortunately her repeat troponin was 239.  This should be considered an NSTEMI until proven otherwise, particular given her ongoing chest pressure which could be related to COPD and anxiety as well.  Offered pain medication in the form morphine but she adamantly declined because of a prior anaphylaxis that she believes was due to morphine.  She requested her usual 600 mg of oral gabapentin which I provided.  I also ordered a full dose aspirin 324 mg and heparin bolus plus infusion.  I will contact the hospitalist for admission.  Patient and her husband understand and agree with the plan.  Clinical Course as of 11/05/21 0108  Tue Nov 05, 2021  0012 Discussed case in person with Dr. Damita Dunnings with the hospitalist service.  She will admit. [CF]    Clinical Course User Index [CF] Hinda Kehr, MD     ____________________________________________  FINAL CLINICAL IMPRESSION(S) / ED DIAGNOSES  Final diagnoses:  NSTEMI (non-ST elevated myocardial infarction) ([place])  Chronic pain syndrome  Chronic obstructive pulmonary disease, unspecified COPD type ([place])     MEDICATIONS GIVEN DURING THIS VISIT:  Medications  heparin bolus via infusion 2,800 Units (has no administration in time range)  heparin ADULT infusion 100 units/mL (25000 units/237mL) (has no administration in time range)  ipratropium-albuterol (DUONEB) 0.5-2.5 (3) MG/3ML nebulizer solution 3 mL (has no administration in time range)  QUEtiapine (SEROQUEL) tablet 50 mg (has no administration in time range)  tiZANidine (ZANAFLEX) tablet 4 mg (has no administration in time range)  aspirin EC tablet 81 mg (has no administration in time range)  nitroGLYCERIN (NITROSTAT) SL tablet 0.4 mg (has no administration in time range)  acetaminophen (TYLENOL) tablet 650 mg (has no administration in time range)  ondansetron (ZOFRAN) injection 4 mg (has no administration in time  range)  atorvastatin (LIPITOR) tablet 40 mg (has no administration in time range)  ipratropium-albuterol (DUONEB) 0.5-2.5 (3) MG/3ML nebulizer solution 3 mL (has no administration in time range)  albuterol (PROVENTIL) (2.5 MG/3ML) 0.083% nebulizer solution 2.5 mg (has no administration in time range)  gabapentin (NEURONTIN) capsule 300 mg (has no administration in time range)    And  gabapentin (NEURONTIN) capsule 600 mg (has no administration in time range)  gabapentin (NEURONTIN) capsule 600 mg (600 mg Oral Given 11/05/21 0006)  aspirin chewable tablet 324 mg (324 mg  Oral Given 11/05/21 0006)     ED Discharge Orders     None        Note:  This document was prepared using Dragon voice recognition software and may include unintentional dictation errors.   Hinda Kehr, MD 11/05/21 [phone]

## 2021-11-05 ENCOUNTER — Inpatient Hospital Stay (HOSPITAL_COMMUNITY)
Admit: 2021-11-05 | Discharge: 2021-11-05 | Disposition: A | Discharge status: Home / Self Care | Attending: Cardiology | Admitting: Cardiology

## 2021-11-05 ENCOUNTER — Other Ambulatory Visit: Payer: Self-pay

## 2021-11-05 DIAGNOSIS — Z9981 Dependence on supplemental oxygen: Secondary | ICD-10-CM | POA: Diagnosis not present

## 2021-11-05 DIAGNOSIS — R0602 Shortness of breath: Secondary | ICD-10-CM | POA: Diagnosis present

## 2021-11-05 DIAGNOSIS — G894 Chronic pain syndrome: Secondary | ICD-10-CM | POA: Diagnosis present

## 2021-11-05 DIAGNOSIS — Z79899 Other long term (current) drug therapy: Secondary | ICD-10-CM | POA: Diagnosis not present

## 2021-11-05 DIAGNOSIS — Z7951 Long term (current) use of inhaled steroids: Secondary | ICD-10-CM | POA: Diagnosis not present

## 2021-11-05 DIAGNOSIS — R079 Chest pain, unspecified: Secondary | ICD-10-CM

## 2021-11-05 DIAGNOSIS — J441 Chronic obstructive pulmonary disease with (acute) exacerbation: Secondary | ICD-10-CM | POA: Diagnosis present

## 2021-11-05 DIAGNOSIS — F411 Generalized anxiety disorder: Secondary | ICD-10-CM | POA: Diagnosis present

## 2021-11-05 DIAGNOSIS — Z881 Allergy status to other antibiotic agents status: Secondary | ICD-10-CM | POA: Diagnosis not present

## 2021-11-05 DIAGNOSIS — Z8249 Family history of ischemic heart disease and other diseases of the circulatory system: Secondary | ICD-10-CM | POA: Diagnosis not present

## 2021-11-05 DIAGNOSIS — R778 Other specified abnormalities of plasma proteins: Secondary | ICD-10-CM | POA: Diagnosis present

## 2021-11-05 DIAGNOSIS — Z853 Personal history of malignant neoplasm of breast: Secondary | ICD-10-CM | POA: Diagnosis not present

## 2021-11-05 DIAGNOSIS — F17210 Nicotine dependence, cigarettes, uncomplicated: Secondary | ICD-10-CM | POA: Diagnosis present

## 2021-11-05 DIAGNOSIS — M810 Age-related osteoporosis without current pathological fracture: Secondary | ICD-10-CM | POA: Diagnosis present

## 2021-11-05 DIAGNOSIS — Z20822 Contact with and (suspected) exposure to covid-19: Secondary | ICD-10-CM | POA: Diagnosis present

## 2021-11-05 DIAGNOSIS — I214 Non-ST elevation (NSTEMI) myocardial infarction: Secondary | ICD-10-CM | POA: Diagnosis present

## 2021-11-05 DIAGNOSIS — J9611 Chronic respiratory failure with hypoxia: Secondary | ICD-10-CM

## 2021-11-05 DIAGNOSIS — Z923 Personal history of irradiation: Secondary | ICD-10-CM | POA: Diagnosis not present

## 2021-11-05 DIAGNOSIS — E785 Hyperlipidemia, unspecified: Secondary | ICD-10-CM | POA: Diagnosis present

## 2021-11-05 DIAGNOSIS — Z87892 Personal history of anaphylaxis: Secondary | ICD-10-CM | POA: Diagnosis not present

## 2021-11-05 DIAGNOSIS — F419 Anxiety disorder, unspecified: Secondary | ICD-10-CM | POA: Diagnosis present

## 2021-11-05 DIAGNOSIS — Z905 Acquired absence of kidney: Secondary | ICD-10-CM | POA: Diagnosis not present

## 2021-11-05 DIAGNOSIS — I25118 Atherosclerotic heart disease of native coronary artery with other forms of angina pectoris: Secondary | ICD-10-CM | POA: Diagnosis present

## 2021-11-05 DIAGNOSIS — F339 Major depressive disorder, recurrent, unspecified: Secondary | ICD-10-CM | POA: Diagnosis present

## 2021-11-05 DIAGNOSIS — Z885 Allergy status to narcotic agent status: Secondary | ICD-10-CM | POA: Diagnosis not present

## 2021-11-05 DIAGNOSIS — Z9012 Acquired absence of left breast and nipple: Secondary | ICD-10-CM | POA: Diagnosis not present

## 2021-11-05 DIAGNOSIS — Z803 Family history of malignant neoplasm of breast: Secondary | ICD-10-CM | POA: Diagnosis not present

## 2021-11-05 LAB — BLOOD GAS, VENOUS
Acid-Base Excess: 5.2 mmol/L — ABNORMAL HIGH (ref 0.0–2.0)
Bicarbonate: 31.1 mmol/L — ABNORMAL HIGH (ref 20.0–28.0)
Delivery systems: POSITIVE
FIO2: 30
O2 Saturation: 68 %
Patient temperature: 37
pCO2, Ven: 49 mmHg (ref 44.0–60.0)
pH, Ven: 7.41 (ref 7.250–7.430)
pO2, Ven: 35 mmHg (ref 32.0–45.0)

## 2021-11-05 LAB — RESP PANEL BY RT-PCR (FLU A&B, COVID) ARPGX2
Influenza A by PCR: NEGATIVE
Influenza B by PCR: NEGATIVE
SARS Coronavirus 2 by RT PCR: NEGATIVE

## 2021-11-05 LAB — HEPARIN LEVEL (UNFRACTIONATED)
Heparin Unfractionated: 0.16 IU/mL — ABNORMAL LOW (ref 0.30–0.70)
Heparin Unfractionated: <0.1 IU/mL — ABNORMAL LOW (ref 0.30–0.70)

## 2021-11-05 LAB — CBC
HCT: 38.3 % (ref 36.0–46.0)
Hemoglobin: 12.4 g/dL (ref 12.0–15.0)
MCH: 29.7 pg (ref 26.0–34.0)
MCHC: 32.4 g/dL (ref 30.0–36.0)
MCV: 91.6 fL (ref 80.0–100.0)
Platelets: 264 10*3/uL (ref 150–400)
RBC: 4.18 MIL/uL (ref 3.87–5.11)
RDW: 13.5 % (ref 11.5–15.5)
WBC: 5.5 10*3/uL (ref 4.0–10.5)
nRBC: 0 % (ref 0.0–0.2)

## 2021-11-05 LAB — LIPID PANEL
Cholesterol: 180 mg/dL (ref 0–200)
HDL: 67 mg/dL (ref >40)
LDL Cholesterol: 103 mg/dL — ABNORMAL HIGH (ref 0–99)
Total CHOL/HDL Ratio: 2.7 RATIO
Triglycerides: 49 mg/dL (ref <150)
VLDL: 10 mg/dL (ref 0–40)

## 2021-11-05 LAB — D-DIMER, QUANTITATIVE: D-Dimer, Quant: 0.4 ug/mL-FEU (ref 0.00–0.50)

## 2021-11-05 LAB — PROTIME-INR
INR: 1 (ref 0.8–1.2)
Prothrombin Time: 12.7 seconds (ref 11.4–15.2)

## 2021-11-05 LAB — APTT: aPTT: 46 seconds — ABNORMAL HIGH (ref 24–36)

## 2021-11-05 LAB — BASIC METABOLIC PANEL
Anion gap: 6 (ref 5–15)
BUN: 6 mg/dL — ABNORMAL LOW (ref 8–23)
CO2: 29 mmol/L (ref 22–32)
Calcium: 8.6 mg/dL — ABNORMAL LOW (ref 8.9–10.3)
Chloride: 103 mmol/L (ref 98–111)
Creatinine, Ser: 0.48 mg/dL (ref 0.44–1.00)
GFR, Estimated: >60 mL/min (ref >60)
Glucose, Bld: 94 mg/dL (ref 70–99)
Potassium: 4.5 mmol/L (ref 3.5–5.1)
Sodium: 138 mmol/L (ref 135–145)

## 2021-11-05 LAB — HIV ANTIBODY (ROUTINE TESTING W REFLEX): HIV Screen 4th Generation wRfx: NONREACTIVE

## 2021-11-05 LAB — TROPONIN I (HIGH SENSITIVITY)
Troponin I (High Sensitivity): 108 ng/L (ref <18)
Troponin I (High Sensitivity): 48 ng/L — ABNORMAL HIGH (ref <18)

## 2021-11-05 MED ORDER — NITROGLYCERIN 0.4 MG SL SUBL: 0.4000 mg | SUBLINGUAL_TABLET | SUBLINGUAL | Status: DC | PRN

## 2021-11-05 MED ORDER — LIDOCAINE 5 % EX PTCH
1.0000 | MEDICATED_PATCH | CUTANEOUS | Status: DC
  Administered 2021-11-05: 17:00:00 1 via TRANSDERMAL
  Filled 2021-11-05 (×2): qty 1

## 2021-11-05 MED ORDER — GABAPENTIN 300 MG PO CAPS: 300.0000 mg | ORAL_CAPSULE | ORAL | Status: DC

## 2021-11-05 MED ORDER — IPRATROPIUM-ALBUTEROL 0.5-2.5 (3) MG/3ML IN SOLN
3.0000 mL | Freq: Once | RESPIRATORY_TRACT | Status: AC
  Administered 2021-11-05: 02:00:00 3 mL via RESPIRATORY_TRACT
  Filled 2021-11-05: qty 3

## 2021-11-05 MED ORDER — TIZANIDINE HCL 4 MG PO TABS
4.0000 mg | ORAL_TABLET | Freq: Two times a day (BID) | ORAL | Status: DC | PRN
  Administered 2021-11-05 (×2): 4 mg via ORAL
  Filled 2021-11-05: qty 1
  Filled 2021-11-05: qty 2
  Filled 2021-11-05: qty 1

## 2021-11-05 MED ORDER — HEPARIN BOLUS VIA INFUSION
1400.0000 [IU] | Freq: Once | INTRAVENOUS | Status: AC
  Administered 2021-11-05: 19:00:00 1400 [IU] via INTRAVENOUS
  Filled 2021-11-05: qty 1400

## 2021-11-05 MED ORDER — IPRATROPIUM-ALBUTEROL 0.5-2.5 (3) MG/3ML IN SOLN
3.0000 mL | Freq: Four times a day (QID) | RESPIRATORY_TRACT | Status: DC
  Administered 2021-11-05 – 2021-11-06 (×4): 3 mL via RESPIRATORY_TRACT
  Filled 2021-11-05 (×6): qty 3

## 2021-11-05 MED ORDER — HEPARIN BOLUS VIA INFUSION
1400.0000 [IU] | Freq: Once | INTRAVENOUS | Status: AC
  Administered 2021-11-05: 11:00:00 1400 [IU] via INTRAVENOUS
  Filled 2021-11-05: qty 1400

## 2021-11-05 MED ORDER — QUETIAPINE FUMARATE 25 MG PO TABS
50.0000 mg | ORAL_TABLET | Freq: Every day | ORAL | Status: DC
  Administered 2021-11-05 (×2): 50 mg via ORAL
  Filled 2021-11-05 (×2): qty 2

## 2021-11-05 MED ORDER — SODIUM CHLORIDE 0.9% FLUSH: 3.0000 mL | Freq: Two times a day (BID) | INTRAVENOUS | Status: DC

## 2021-11-05 MED ORDER — ALBUTEROL SULFATE (2.5 MG/3ML) 0.083% IN NEBU
2.5000 mg | INHALATION_SOLUTION | RESPIRATORY_TRACT | Status: DC | PRN
  Administered 2021-11-06: 06:00:00 2.5 mg via RESPIRATORY_TRACT
  Filled 2021-11-05: qty 3

## 2021-11-05 MED ORDER — ATORVASTATIN CALCIUM 20 MG PO TABS
40.0000 mg | ORAL_TABLET | Freq: Every day | ORAL | Status: DC
  Administered 2021-11-05 – 2021-11-06 (×2): 40 mg via ORAL
  Filled 2021-11-05 (×2): qty 2

## 2021-11-05 MED ORDER — ONDANSETRON HCL 4 MG/2ML IJ SOLN: 4.0000 mg | Freq: Four times a day (QID) | INTRAMUSCULAR | Status: DC | PRN

## 2021-11-05 MED ORDER — HEPARIN (PORCINE) 25000 UT/250ML-% IV SOLN
1100.0000 [IU]/h | INTRAVENOUS | Status: DC
  Administered 2021-11-05: 950 [IU]/h via INTRAVENOUS
  Administered 2021-11-05: 02:00:00 600 [IU]/h via INTRAVENOUS
  Filled 2021-11-05 (×2): qty 250

## 2021-11-05 MED ORDER — GABAPENTIN 300 MG PO CAPS
300.0000 mg | ORAL_CAPSULE | Freq: Every day | ORAL | Status: DC
  Administered 2021-11-05 – 2021-11-06 (×2): 300 mg via ORAL
  Filled 2021-11-05 (×2): qty 1

## 2021-11-05 MED ORDER — ACETAMINOPHEN 325 MG PO TABS
650.0000 mg | ORAL_TABLET | ORAL | Status: DC | PRN
  Administered 2021-11-05: 17:00:00 650 mg via ORAL
  Filled 2021-11-05: qty 2

## 2021-11-05 MED ORDER — GABAPENTIN 300 MG PO CAPS
600.0000 mg | ORAL_CAPSULE | Freq: Every day | ORAL | Status: DC
  Administered 2021-11-05: 22:00:00 600 mg via ORAL
  Filled 2021-11-05: qty 2

## 2021-11-05 MED ORDER — ASPIRIN EC 81 MG PO TBEC
81.0000 mg | DELAYED_RELEASE_TABLET | Freq: Every day | ORAL | Status: DC
  Administered 2021-11-06: 08:00:00 81 mg via ORAL
  Filled 2021-11-05: qty 1

## 2021-11-05 MED ORDER — METHOCARBAMOL 500 MG PO TABS
500.0000 mg | ORAL_TABLET | Freq: Three times a day (TID) | ORAL | Status: DC | PRN
  Administered 2021-11-05: 15:00:00 500 mg via ORAL
  Filled 2021-11-05 (×2): qty 1

## 2021-11-05 MED ORDER — HEPARIN BOLUS VIA INFUSION
2800.0000 [IU] | Freq: Once | INTRAVENOUS | Status: AC
  Administered 2021-11-05: 02:00:00 2800 [IU] via INTRAVENOUS
  Filled 2021-11-05: qty 2800

## 2021-11-05 NOTE — ED Notes (Signed)
Report messaged to bryanna rn floor nurse

## 2021-11-05 NOTE — Consult Note (Signed)
CARDIOLOGY CONSULT NOTE           Patient ID: Colleen [name] MRN: [id number] DOB/AGE: [birth year] 60 y.o.  Admit date: 11/04/2021 Referring Physician Dr. Judd Gaudier Primary Physician none Primary Cardiologist none Reason for Consultation chest pain  HPI: Patient is a 60 year old female with a past medical history significant for COPD on 3 L of oxygen at home, tobacco abuse, anxiety, depression, history of left breast cancer who presented to the ED 11/04/2021 with chest tightness.  Patient states she has had chest tightness and pressure in the upper portions of her bilateral chest for the past month or so and associated cough.  She states the chest pressure suddenly worsened the day prior to presentation in the ED and she described it as a elephant sitting on her chest.  At the time she was doing laundry when the chest pain occurred.  She admits to associated productive cough that she states has been present for a long time.  She states the chest tightness does get better when she uses her inhalers and she admits to compliance with them at home.  She states she has been under significant stress with her family during the holidays 2.  She denies any syncope, presyncope, palpitations, lower extremity edema.  She denies history of heart attack, stroke. She states during a prior hospitalization in [place] she saw a cardiologist, but she is unsure what procedures she had performed.  She admits to a family history of coronary artery disease in her maternal grandmother and grandfather.  Patient currently smokes half pack of cigarettes per day and has for the past 2 years.  She admits to greater than 10 years of smoking tobacco but does not quantify how much she used to smoke in the past.  Denies alcohol use.  Labs are significant for troponin 6-239-109.  EKG showed normal sinus rhythm with ventricular rate of 84 bpm and a nonspecific T wave abnormality.  Vital signs significant for blood pressure  97/65, O2 sat 93 on 2 L oxygen by nasal cannula.  Review of systems complete and found to be negative unless listed above    Past Medical History:  Diagnosis Date   Anxiety    Asthma    Breast cancer ([place]) 2004   left breast   Chronic back pain    COPD (chronic obstructive pulmonary disease) ([place])    Depression    Personal history of radiation therapy     Past Surgical History:  Procedure Laterality Date   BREAST BIOPSY Left 2004   positive   BREAST BIOPSY Right    neg   BREAST LUMPECTOMY Left 2004   positive   MASTECTOMY Left    MASTECTOMY     NEPHRECTOMY Left    TUBAL LIGATION      (Not in a hospital admission)  Social History   Socioeconomic History   Marital status: Married    Spouse name: Not on file   Number of children: 2   Years of education: GED   Highest education level: Not on file  Occupational History   Not on file  Tobacco Use   Smoking status: Every Day    Packs/day: 0.50    Types: Cigarettes   Smokeless tobacco: Never  Vaping Use   Vaping Use: Never used  Substance and Sexual Activity   Alcohol use: Yes   Drug use: Never   Sexual activity: Not Currently  Other Topics Concern   Not on file  Social History Narrative  Not on file   Social Determinants of Health   Financial Resource Strain: Not on file  Food Insecurity: Not on file  Transportation Needs: Not on file  Physical Activity: Not on file  Stress: Not on file  Social Connections: Not on file  Intimate Partner Violence: Not on file    Family History  Problem Relation Age of Onset   Breast cancer Cousin    Breast cancer Cousin    Bipolar disorder Daughter    Drug abuse Daughter    Alcohol abuse Maternal Grandfather       Review of systems complete and found to be negative unless listed above    PHYSICAL EXAM General: Ill-appearing Caucasian female, well nourished, in no acute distress. HEENT:  Normocephalic and atraumatic. Neck:  No JVD.  Lungs: Normal respiratory  effort on 2 L by nasal cannula. Clear bilaterally to auscultation. No wheezes, crackles, rhonchi.  Heart: HRRR . Normal S1 and S2 without gallops or murmurs. Radial & DP pulses 2+ bilaterally. Abdomen: Non-distended appearing.  Msk: Normal strength and tone for age. Extremities: No clubbing, cyanosis or edema.   Neuro: Alert and oriented X 3. Psych:  Mood anxious, affect congruent.   Labs:   Lab Results  Component Value Date   WBC 5.5 11/05/2021   HGB 12.4 11/05/2021   HCT 38.3 11/05/2021   MCV 91.6 11/05/2021   PLT 264 11/05/2021    Recent Labs  Lab 11/05/21 0711  NA 138  K 4.5  CL 103  CO2 29  BUN 6*  CREATININE 0.48  CALCIUM 8.6*  GLUCOSE 94   Lab Results  Component Value Date   TROPONINI <0.03 10/11/2018    Lab Results  Component Value Date   CHOL 180 11/05/2021   Lab Results  Component Value Date   HDL 67 11/05/2021   Lab Results  Component Value Date   LDLCALC 103 (H) 11/05/2021   Lab Results  Component Value Date   TRIG 49 11/05/2021   Lab Results  Component Value Date   CHOLHDL 2.7 11/05/2021   No results found for: LDLDIRECT    Radiology: DG Chest 2 View  Result Date: 11/04/2021 CLINICAL DATA:  Chest pain, shortness of breath increased over 1 week, asthma, COPD, smoker EXAM: CHEST - 2 VIEW COMPARISON:  10/19/2021 FINDINGS: Normal heart size, mediastinal contours, and pulmonary vascularity. Emphysematous and bronchitic changes consistent with COPD. Chronic accentuation of basilar markings. No definite acute infiltrate, pleural effusion, or pneumothorax. No acute osseous findings. IMPRESSION: COPD changes without acute abnormalities. Electronically Signed   By: Lavonia Dana M.D.   On: 11/04/2021 16:28   DG Chest 2 View  Result Date: 10/12/2021 CLINICAL DATA:  Chest pain, back pain EXAM: CHEST - 2 VIEW COMPARISON:  Previous studies including the examination of 09/29/2021 FINDINGS: Cardiac size is within normal limits. Increase in AP diameter of  chest and flattening of diaphragms suggests COPD. There are no signs of alveolar pulmonary edema or new focal infiltrates. There is improvement in aeration of both lower lung fields since 09/29/2021. There is no significant pleural effusion or pneumothorax. IMPRESSION: COPD. There are no signs of pulmonary edema or focal pulmonary consolidation. There is no pleural effusion. Electronically Signed   By: Elmer Picker M.D.   On: 10/12/2021 17:17   CT Angio Chest PE W and/or Wo Contrast  Result Date: 10/19/2021 CLINICAL DATA:  Pulmonary embolism. EXAM: CT ANGIOGRAPHY CHEST WITH CONTRAST TECHNIQUE: Multidetector CT imaging of the chest was performed using the  standard protocol during bolus administration of intravenous contrast. Multiplanar CT image reconstructions and MIPs were obtained to evaluate the vascular anatomy. CONTRAST:  79mL OMNIPAQUE IOHEXOL 350 MG/ML SOLN COMPARISON:  CT 05/05/2021 FINDINGS: Cardiovascular: No filling defects within the pulmonary arteries to suggest acute pulmonary embolism. No significant vascular findings. Normal heart size. No pericardial effusion. Mediastinum/Nodes: No axillary or supraclavicular adenopathy. No mediastinal or hilar adenopathy. No pericardial fluid. Esophagus normal. Lungs/Pleura: No suspicious pulmonary nodules. Normal pleural. Airways normal. Upper Abdomen: Limited view of the liver, kidneys, pancreas are unremarkable. Normal adrenal glands. Musculoskeletal: No aggressive osseous lesion. 1. No acute pulmonary embolism. 2. No acute pulmonary parenchymal findings. Review of the MIP images confirms the above findings. Electronically Signed   By: Suzy Bouchard M.D.   On: 10/19/2021 13:39   DG Chest Portable 1 View  Result Date: 10/19/2021 CLINICAL DATA:  Shortness of breath EXAM: PORTABLE CHEST 1 VIEW COMPARISON:  Chest x-ray dated October 12, 2021; CT chest dated May 01, 2021 FINDINGS: Cardiac and mediastinal contours are within normal limits. Mild  lingular opacity favored to be due to scarring when compared with most recent prior CT. No new parenchymal opacity. No evidence of pleural effusion or pneumothorax. IMPRESSION: No active disease. Electronically Signed   By: Yetta Glassman M.D.   On: 10/19/2021 11:43    ECHO no prior echo available for review.  Ordered 12/27  TELEMETRY reviewed by me: Normal sinus rhythm with ventricular rate 84 bpm  EKG reviewed by me: EKG showed normal sinus rhythm with ventricular rate of 84 bpm and a nonspecific T wave abnormality.  ASSESSMENT AND PLAN:  60 year old female with a past medical history significant for COPD on 3 L of oxygen at home, tobacco abuse, anxiety, depression, history of left breast cancer who presented to the ED 11/04/2021 with chest tightness.  #Chest pain/elevated troponin Troponins trended 6-239-108.  Patient describes her symptoms as more of a chest tightness and it is relieved by her inhalers, it is possible this is related to her COPD and ongoing significant stress.  Continue aspirin  Continue heparin drip  Ordered echocardiogram for assessment of heart structure and function Dr. Nehemiah Massed recommends further evaluation with cardiac catheterization to possibly be performed tomorrow. Patient will be made NPO at midnight.   #COPD #tobacco abuse Continue inhalers and supportive care per hospitalist Encouraged smoking cessation  #hyperlipidemia Continue statin   This patient was seen and examined by Dr. Serafina Royals and he is in agreement with this plan of care.   Signed: Tristan Schroeder , PA-C 11/05/2021, 8:47 AM

## 2021-11-05 NOTE — Progress Notes (Signed)
Pt admitted by Dr Damita Dunnings earlier this am please see note for detailed H&P.    Colleen [name] is a 60 y.o. 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    She was started on IV heparin , cardiology consulted.   Currently chest pain free.   Monitor.   Hosie Poisson, MD

## 2021-11-05 NOTE — H&P (Addendum)
History and Physical    [name] KKX:[id number] DOB: 1961/05/04 DOA: 11/04/2021  PCP: Remi Haggard, FNP   Patient coming from: home  I have personally briefly reviewed patient's relevant medical records in [place]  Chief Complaint: chest pain  HPI: Colleen [name] is a 60 y.o. 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  She describes feeling a tightness in the upper part of her chest radiating to her neck almost like a muscle spasm.  She denies associated nausea, vomiting, diaphoresis, palpitations or lightheadedness.  States she has had a congested cough since Thanksgiving, productive of clear phlegm but it has not recently worsened.  She denies fever or chills.  Denies leg pain, swelling or orthopnea.  ED course: T-max 98.6 with pulse 104, respirations 22 and O2 sat 98% on 3 L Blood work troponin 6-239 and otherwise unremarkable  EKG, personally viewed and interpreted: Sinus tachycardia at 108 with no acute ST-T wave changes  Imaging: Chest x-ray with COPD changes without acute abnormalities  Patient given chewable aspirin, started on a heparin infusion.  Hospitalist consulted for admission.   Review of Systems: As per HPI otherwise all other systems on review of systems negative.   Assessment/Plan    NSTEMI (non-ST elevated myocardial infarction) (HCC) -Chest pain, somewhat atypical and with EKG nonacute but troponin bumped from 6>239 - We will add on D-dimer - Continue heparin infusion - Daily aspirin, atorvastatin and Coreg - Nitroglycerin sublingual as needed pain - Patient states she has severe allergies to morphine and any narcotics - Cardiology consult for further recommendations - We will keep n.p.o. in case of procedure in the a.m.     COPD exacerbation (HCC)   Chronic respiratory failure with hypoxia (HCC) - DuoNebs every 6 and as needed - Continue supplemental oxygen    Chronic pain  - Continue gabapentin, Zanaflex    GAD (generalized anxiety disorder) - Continue Seroquel   DVT prophylaxis: Lovenox  Code Status: full code  Family Communication:  husband at bedside Disposition Plan: Back to previous home environment Consults called: none  Status:At the time of admission, it appears that the appropriate admission status for this patient is INPATIENT. This is judged to be reasonable and necessary in order to provide the required intensity of service to ensure the patient's safety given the presenting symptoms, physical exam findings, and initial radiographic and laboratory data in the context of their  Comorbid conditions.   Patient requires inpatient status due to high intensity of service, high risk for further deterioration and high frequency of surveillance required.   I certify that at the point of admission it is my clinical judgment that the patient will require inpatient hospital care spanning beyond 2 midnights     Physical Exam: Vitals:   11/04/21 2149 11/04/21 2345 11/04/21 2357 11/05/21 0000  BP: 118/62 139/82  118/89  Pulse: 98 94 90 95  Resp: (!) 22  20   Temp: 98.6 F (37 C)     TempSrc: Oral     SpO2: 94% 96% 98% 95%   Constitutional: Alert, oriented x 3 .  Mild conversational dyspnea HEENT:      Head: Normocephalic and atraumatic.         Eyes: PERLA, EOMI, Conjunctivae are normal. Sclera is non-icteric.  Mouth/Throat: Mucous membranes are moist.       Neck: Supple with no signs of meningismus. Cardiovascular: Regular rate and rhythm. No murmurs, gallops, or rubs. 2+ symmetrical distal pulses are present . No JVD. No  LE edema Respiratory: Respiratory effort increased.Lungs sounds diminished bilaterally.  Scattered wheezes Gastrointestinal: Soft, non tender, non distended. Positive  bowel sounds.  Genitourinary: No CVA tenderness. Musculoskeletal: Nontender with normal range of motion in all extremities. No cyanosis, or erythema of extremities. Neurologic:  Face is symmetric. Moving all extremities. No gross focal neurologic deficits . Skin: Skin is warm, dry.  No rash or ulcers Psychiatric: Mood and affect are appropriate     Past Medical History:  Diagnosis Date   Anxiety    Asthma    Breast cancer ([place]) 2004   left breast   Chronic back pain    COPD (chronic obstructive pulmonary disease) ([place])    Depression    Personal history of radiation therapy     Past Surgical History:  Procedure Laterality Date   BREAST BIOPSY Left 2004   positive   BREAST BIOPSY Right    neg   BREAST LUMPECTOMY Left 2004   positive   MASTECTOMY Left    MASTECTOMY     NEPHRECTOMY Left    TUBAL LIGATION       reports that she has been smoking cigarettes. She has been smoking an average of .5 packs per day. She has never used smokeless tobacco. She reports current alcohol use. She reports that she does not use drugs.  Allergies  Allergen Reactions   Desvenlafaxine Anaphylaxis   Morphine And Related Anaphylaxis   Penicillins Anaphylaxis    Has patient had a PCN reaction causing immediate rash, facial/tongue/throat swelling, SOB or lightheadedness with hypotension: Yes Has patient had a PCN reaction causing severe rash involving mucus membranes or skin necrosis: No Has patient had a PCN reaction that required hospitalization: Unknown Has patient had a PCN reaction occurring within the last 10 years: No If all of the above answers are "NO", then may proceed with Cephalosporin use.    Prazosin Other (See Comments)   Tamoxifen Anaphylaxis   Trazodone Anaphylaxis   Clonazepam    Codeine Swelling   Duloxetine Itching   Hydroxyzine Itching   Lorazepam     Patient feels this medications makes her to sedated and wants to avoid   Paroxetine Hcl Hives   Sulfa Antibiotics  Itching   Cephalexin Rash    Family History  Problem Relation Age of Onset   Breast cancer Cousin    Breast cancer Cousin    Bipolar disorder Daughter    Drug abuse Daughter    Alcohol abuse Maternal Grandfather       Prior to Admission medications   Medication Sig Start Date End Date Taking? Authorizing Provider  acetaminophen (TYLENOL) 500 MG tablet Take 500 mg by mouth every 6 (six) hours as needed for headache, fever, moderate pain or mild pain.    [provider]  Cholecalciferol 25 MCG (1000 UT) tablet Take by mouth.    [provider]  denosumab (PROLIA) 60 MG/ML SOSY injection Inject 60 mg into the skin every 6 (six) months.    [provider]  EPINEPHrine 0.3 mg/0.3 mL IJ SOAJ injection Inject 0.3 mg into the muscle as needed.    [provider]  fluticasone (FLONASE) 50 MCG/ACT nasal spray Place 2 sprays into both nostrils daily. 10/23/21 11/22/21  Pokhrel, Corrie Mckusick, MD  Fluticasone-Umeclidin-Vilant (TRELEGY  ELLIPTA) 100-62.5-25 MCG/INH AEPB Inhale 1 puff into the lungs daily.    [provider]  gabapentin (NEURONTIN) 300 MG capsule Take 1-2 capsules (300-600 mg total) by mouth as directed. Take 1 capsule daily AM and 2 capsules daily at bedtime 09/23/21   Ursula Alert, MD  ipratropium (ATROVENT) 0.02 % nebulizer solution Take by nebulization 4 (four) times daily. 03/26/21   [provider]  lidocaine (LIDODERM) 5 % Place 1 patch onto the skin daily. Remove & Discard patch within 12 hours or as directed by MD 10/22/21   Pokhrel, Corrie Mckusick, MD  Menthol (ICY HOT) 5 % PTCH Apply 1 patch topically daily as needed.    [provider]  QUEtiapine (SEROQUEL) 50 MG tablet Take 1 tablet (50 mg total) by mouth at bedtime. 10/22/21 11/21/21  Pokhrel, Corrie Mckusick, MD  tiZANidine (ZANAFLEX) 4 MG tablet Take 1 tablet (4 mg total) by mouth every 12 (twelve) hours as needed for muscle spasms. 10/09/21 11/08/21  Gillis Santa, MD  VENTOLIN  HFA 108 (90 Base) MCG/ACT inhaler Inhale 1-2 puffs into the lungs every 4 (four) hours as needed for cough or wheezing. 10/02/18   [provider]      Labs on Admission: I have personally reviewed following labs and imaging studies  CBC: Recent Labs  Lab 11/04/21 1535  WBC 6.5  HGB 12.3  HCT 37.3  MCV 91.2  PLT 009   Basic Metabolic Panel: Recent Labs  Lab 11/04/21 1535  NA 133*  K 3.6  CL 98  CO2 31  GLUCOSE 130*  BUN 7*  CREATININE 0.54  CALCIUM 8.5*   GFR: CrCl cannot be calculated (Unknown ideal weight.). Liver Function Tests: No results for input(s): AST, ALT, ALKPHOS, BILITOT, PROT, ALBUMIN in the last 168 hours. No results for input(s): LIPASE, AMYLASE in the last 168 hours. No results for input(s): AMMONIA in the last 168 hours. Coagulation Profile: No results for input(s): INR, PROTIME in the last 168 hours. Cardiac Enzymes: No results for input(s): CKTOTAL, CKMB, CKMBINDEX, TROPONINI in the last 168 hours. BNP (last 3 results) No results for input(s): PROBNP in the last 8760 hours. HbA1C: No results for input(s): HGBA1C in the last 72 hours. CBG: No results for input(s): GLUCAP in the last 168 hours. Lipid Profile: No results for input(s): CHOL, HDL, LDLCALC, TRIG, CHOLHDL, LDLDIRECT in the last 72 hours. Thyroid Function Tests: No results for input(s): TSH, T4TOTAL, FREET4, T3FREE, THYROIDAB in the last 72 hours. Anemia Panel: No results for input(s): VITAMINB12, FOLATE, FERRITIN, TIBC, IRON, RETICCTPCT in the last 72 hours. Urine analysis:    Component Value Date/Time   COLORURINE YELLOW (A) 10/12/2018 0654   APPEARANCEUR CLEAR (A) 10/12/2018 0654   LABSPEC 1.015 10/12/2018 0654   PHURINE 6.0 10/12/2018 0654   GLUCOSEU 50 (A) 10/12/2018 0654   HGBUR NEGATIVE 10/12/2018 0654   BILIRUBINUR NEGATIVE 10/12/2018 0654   KETONESUR 20 (A) 10/12/2018 0654   PROTEINUR NEGATIVE 10/12/2018 0654   NITRITE NEGATIVE 10/12/2018 0654    LEUKOCYTESUR TRACE (A) 10/12/2018 0654    Radiological Exams on Admission: DG Chest 2 View  Result Date: 11/04/2021 CLINICAL DATA:  Chest pain, shortness of breath increased over 1 week, asthma, COPD, smoker EXAM: CHEST - 2 VIEW COMPARISON:  10/19/2021 FINDINGS: Normal heart size, mediastinal contours, and pulmonary vascularity. Emphysematous and bronchitic changes consistent with COPD. Chronic accentuation of basilar markings. No definite acute infiltrate, pleural effusion, or pneumothorax. No acute osseous findings. IMPRESSION: COPD changes without acute abnormalities. Electronically Signed  By: Lavonia Dana M.D.   On: 11/04/2021 16:28       Athena Masse MD [facility]   11/05/2021, 1:02 AM

## 2021-11-05 NOTE — ED Notes (Signed)
Pt alert, heparin infusing.  Pt waiting on admission bed.  Family with pt.

## 2021-11-05 NOTE — ED Notes (Signed)
Lab adding on trop to morning blood work.

## 2021-11-05 NOTE — Progress Notes (Signed)
Lab notified of 1700 heparin collection

## 2021-11-05 NOTE — ED Notes (Signed)
NS asked to order lunch tray since diet order was entered.

## 2021-11-05 NOTE — ED Notes (Signed)
Pt c/o intermittent cramps/spasms in BLEs.

## 2021-11-05 NOTE — Progress Notes (Signed)
[place] for heparin infusion Indication: ACS/STEMI  Allergies  Allergen Reactions   Desvenlafaxine Anaphylaxis   Morphine And Related Anaphylaxis   Penicillins Anaphylaxis    Has patient had a PCN reaction causing immediate rash, facial/tongue/throat swelling, SOB or lightheadedness with hypotension: Yes Has patient had a PCN reaction causing severe rash involving mucus membranes or skin necrosis: No Has patient had a PCN reaction that required hospitalization: Unknown Has patient had a PCN reaction occurring within the last 10 years: No If all of the above answers are "NO", then may proceed with Cephalosporin use.    Prazosin Other (See Comments)   Tamoxifen Anaphylaxis   Trazodone Anaphylaxis   Clonazepam    Codeine Swelling   Duloxetine Itching   Hydroxyzine Itching   Lorazepam     Patient feels this medications makes her to sedated and wants to avoid   Paroxetine Hcl Hives   Sulfa Antibiotics Itching   Cephalexin Rash    Patient Measurements:   Heparin Dosing Weight: 46.8 kg  (per pt wt 61.2 kg from ED note 121/3/22)  Vital Signs: Temp: 99.1 F (37.3 C) (12/27 1857) Temp Source: Oral (12/27 1857) BP: 98/62 (12/27 1836) Pulse Rate: 90 (12/27 1836)  Labs: Recent Labs    11/04/21 1535 11/04/21 2158 11/05/21 0202 11/05/21 0711 11/05/21 1628  HGB 12.3  --   --  12.4  --   HCT 37.3  --   --  38.3  --   PLT 285  --   --  264  --   APTT  --   --  46*  --   --   LABPROT  --   --  12.7  --   --   INR  --   --  1.0  --   --   HEPARINUNFRC  --   --   --  0.16* <0.10*  CREATININE 0.54  --   --  0.48  --   TROPONINIHS 6 239*  --  108* 48*     CrCl cannot be calculated (Unknown ideal weight.).   Medical History: Past Medical History:  Diagnosis Date   Anxiety    Asthma    Breast cancer ([place]) 2004   left breast   Chronic back pain    COPD (chronic obstructive pulmonary disease) (HCC)    Depression    Personal  history of radiation therapy    Heparin Dosing Weight: 46.8 kg   Assessment: Pt is 60 yo female presenting to ED c/o increase SOB over past week found with slightly elevated Troponin I, trending up.  Pt with h/o Lumbar Epidural Injections with last documented on 03/21/21.  Date Time   HL Rate/Comment 12/27  0711  0.16 Subthera; 600 > 750 un/hr 12/27 1628 <0.1 Subthera; 750 > 950 un/hr       Baseline Labs: aPTT - 46s INR - 1 Hgb - 12.4 Plts - 285>264  Goal of Therapy:  Heparin level 0.3-0.7 units/ml Monitor platelets by anticoagulation protocol: Yes   Plan:  HL is subtherapeutic at <0.1 on 12/27 1628. Give 1400 units bolus x1; increase heparin infusion to 950 units/hr Check anti-Xa level in 6hrs after rate change and at least daily once consecutively therapeutic. Continue to monitor H&H and platelets daily while on heparin gtt.  Lorna Dibble, PharmD, [facility] Clinical Pharmacist 11/05/2021 6:59 PM

## 2021-11-05 NOTE — Progress Notes (Addendum)
[place] for heparin infusion Indication: ACS/STEMI  Allergies  Allergen Reactions   Desvenlafaxine Anaphylaxis   Morphine And Related Anaphylaxis   Penicillins Anaphylaxis    Has patient had a PCN reaction causing immediate rash, facial/tongue/throat swelling, SOB or lightheadedness with hypotension: Yes Has patient had a PCN reaction causing severe rash involving mucus membranes or skin necrosis: No Has patient had a PCN reaction that required hospitalization: Unknown Has patient had a PCN reaction occurring within the last 10 years: No If all of the above answers are "NO", then may proceed with Cephalosporin use.    Prazosin Other (See Comments)   Tamoxifen Anaphylaxis   Trazodone Anaphylaxis   Clonazepam    Codeine Swelling   Duloxetine Itching   Hydroxyzine Itching   Lorazepam     Patient feels this medications makes her to sedated and wants to avoid   Paroxetine Hcl Hives   Sulfa Antibiotics Itching   Cephalexin Rash    Patient Measurements:   Heparin Dosing Weight: 46.8 kg  (per pt wt 61.2 kg from ED note 121/3/22)  Vital Signs: Temp: 98.6 F (37 C) (12/26 2149) Temp Source: Oral (12/26 2149) BP: 118/89 (12/27 0000) Pulse Rate: 95 (12/27 0000)  Labs: Recent Labs    11/04/21 1535 11/04/21 2158  HGB 12.3  --   HCT 37.3  --   PLT 285  --   CREATININE 0.54  --   TROPONINIHS 6 239*    CrCl cannot be calculated (Unknown ideal weight.).   Medical History: Past Medical History:  Diagnosis Date   Anxiety    Asthma    Breast cancer ([place]) 2004   left breast   Chronic back pain    COPD (chronic obstructive pulmonary disease) (HCC)    Depression    Personal history of radiation therapy     Assessment: Pt is 60 yo female presenting to ED c/o increase SOB over past week found with slightly elevated Troponin I, trending up.  Pt with h/o Lumbar Epidural Injections with last documented on 03/21/21.  Goal of Therapy:   Heparin level 0.3-0.7 units/ml Monitor platelets by anticoagulation protocol: Yes   Plan:  Bolus 2800 units x 1 Start heparin infusion at 600 units/hr Will check HL in 6 hr after start of infusion CBC daily while on heparin  Renda Rolls, PharmD, [facility] 11/05/2021 12:19 AM

## 2021-11-05 NOTE — Progress Notes (Signed)
[place] for heparin infusion Indication: ACS/STEMI  Allergies  Allergen Reactions   Desvenlafaxine Anaphylaxis   Morphine And Related Anaphylaxis   Penicillins Anaphylaxis    Has patient had a PCN reaction causing immediate rash, facial/tongue/throat swelling, SOB or lightheadedness with hypotension: Yes Has patient had a PCN reaction causing severe rash involving mucus membranes or skin necrosis: No Has patient had a PCN reaction that required hospitalization: Unknown Has patient had a PCN reaction occurring within the last 10 years: No If all of the above answers are "NO", then may proceed with Cephalosporin use.    Prazosin Other (See Comments)   Tamoxifen Anaphylaxis   Trazodone Anaphylaxis   Clonazepam    Codeine Swelling   Duloxetine Itching   Hydroxyzine Itching   Lorazepam     Patient feels this medications makes her to sedated and wants to avoid   Paroxetine Hcl Hives   Sulfa Antibiotics Itching   Cephalexin Rash    Patient Measurements:   Heparin Dosing Weight: 46.8 kg  (per pt wt 61.2 kg from ED note 121/3/22)  Vital Signs: BP: 102/83 (12/27 1000) Pulse Rate: 85 (12/27 1000)  Labs: Recent Labs    11/04/21 1535 11/04/21 2158 11/05/21 0202 11/05/21 0711  HGB 12.3  --   --  12.4  HCT 37.3  --   --  38.3  PLT 285  --   --  264  APTT  --   --  46*  --   LABPROT  --   --  12.7  --   INR  --   --  1.0  --   HEPARINUNFRC  --   --   --  0.16*  CREATININE 0.54  --   --  0.48  TROPONINIHS 6 239*  --   --      CrCl cannot be calculated (Unknown ideal weight.).   Medical History: Past Medical History:  Diagnosis Date   Anxiety    Asthma    Breast cancer ([place]) 2004   left breast   Chronic back pain    COPD (chronic obstructive pulmonary disease) (HCC)    Depression    Personal history of radiation therapy     Assessment: Pt is 60 yo female presenting to ED c/o increase SOB over past week found with slightly  elevated Troponin I, trending up.  Pt with h/o Lumbar Epidural Injections with last documented on 03/21/21.  12/27 0711 HL 0.16  Goal of Therapy:  Heparin level 0.3-0.7 units/ml Monitor platelets by anticoagulation protocol: Yes   Plan:  HL subtherapeutic, will give 1400 unit bolus and increase heparin infusion to 750 units/hr Will check HL in 6 hr after rate change CBC daily while on heparin  Sherilyn Banker, PharmD Clinical Pharmacist  11/05/2021 10:33 AM

## 2021-11-05 NOTE — ED Notes (Signed)
Pt was brought [facility] by husband.  Sts it was approved by Hospitalist.

## 2021-11-06 ENCOUNTER — Encounter
Admission: EM | Disposition: A | Discharge status: Home / Self Care | Payer: Self-pay | Source: Home / Self Care | Attending: Internal Medicine

## 2021-11-06 ENCOUNTER — Encounter: Payer: Self-pay | Admitting: Internal Medicine

## 2021-11-06 DIAGNOSIS — J9611 Chronic respiratory failure with hypoxia: Secondary | ICD-10-CM

## 2021-11-06 DIAGNOSIS — G894 Chronic pain syndrome: Secondary | ICD-10-CM

## 2021-11-06 HISTORY — PX: LEFT HEART CATH AND CORONARY ANGIOGRAPHY: CATH118249

## 2021-11-06 LAB — ECHOCARDIOGRAM COMPLETE
Area-P 1/2: 3.72 cm2
S' Lateral: 3.2 cm

## 2021-11-06 LAB — HEPARIN LEVEL (UNFRACTIONATED): Heparin Unfractionated: 0.15 IU/mL — ABNORMAL LOW (ref 0.30–0.70)

## 2021-11-06 SURGERY — LEFT HEART CATH AND CORONARY ANGIOGRAPHY
Anesthesia: Moderate Sedation

## 2021-11-06 MED ORDER — HEPARIN (PORCINE) IN NACL 1000-0.9 UT/500ML-% IV SOLN
INTRAVENOUS | Status: AC
  Filled 2021-11-06: qty 1000

## 2021-11-06 MED ORDER — FENTANYL CITRATE (PF) 100 MCG/2ML IJ SOLN
INTRAMUSCULAR | Status: DC | PRN
  Administered 2021-11-06: 25 ug via INTRAVENOUS

## 2021-11-06 MED ORDER — HYDRALAZINE HCL 20 MG/ML IJ SOLN: 10.0000 mg | INTRAMUSCULAR | Status: AC | PRN

## 2021-11-06 MED ORDER — SODIUM CHLORIDE 0.9 % WEIGHT BASED INFUSION: 3.0000 mL/kg/h | INTRAVENOUS | Status: DC

## 2021-11-06 MED ORDER — LIDOCAINE HCL (PF) 1 % IJ SOLN
INTRAMUSCULAR | Status: DC | PRN
  Administered 2021-11-06: 2 mL

## 2021-11-06 MED ORDER — ONDANSETRON HCL 4 MG/2ML IJ SOLN: 4.0000 mg | Freq: Four times a day (QID) | INTRAMUSCULAR | Status: DC | PRN

## 2021-11-06 MED ORDER — IOHEXOL 300 MG/ML  SOLN
INTRAMUSCULAR | Status: DC | PRN
  Administered 2021-11-06: 09:00:00 64 mL

## 2021-11-06 MED ORDER — LIDOCAINE HCL 1 % IJ SOLN
INTRAMUSCULAR | Status: AC
  Filled 2021-11-06: qty 20

## 2021-11-06 MED ORDER — SODIUM CHLORIDE 0.9 % IV SOLN: 250.0000 mL | INTRAVENOUS | Status: DC | PRN

## 2021-11-06 MED ORDER — SODIUM CHLORIDE 0.9 % WEIGHT BASED INFUSION: 1.0000 mL/kg/h | INTRAVENOUS | Status: DC

## 2021-11-06 MED ORDER — MIDAZOLAM HCL 2 MG/2ML IJ SOLN
INTRAMUSCULAR | Status: DC | PRN
  Administered 2021-11-06: .5 mg via INTRAVENOUS

## 2021-11-06 MED ORDER — ASPIRIN 81 MG PO CHEW: 81.0000 mg | CHEWABLE_TABLET | ORAL | Status: DC

## 2021-11-06 MED ORDER — MIDAZOLAM HCL 2 MG/2ML IJ SOLN
INTRAMUSCULAR | Status: AC
  Filled 2021-11-06: qty 2

## 2021-11-06 MED ORDER — FENTANYL CITRATE (PF) 100 MCG/2ML IJ SOLN
INTRAMUSCULAR | Status: AC
  Filled 2021-11-06: qty 2

## 2021-11-06 MED ORDER — LABETALOL HCL 5 MG/ML IV SOLN: 10.0000 mg | INTRAVENOUS | Status: AC | PRN

## 2021-11-06 MED ORDER — HEPARIN SODIUM (PORCINE) 1000 UNIT/ML IJ SOLN
INTRAMUSCULAR | Status: AC
  Filled 2021-11-06: qty 10

## 2021-11-06 MED ORDER — HEPARIN BOLUS VIA INFUSION
1400.0000 [IU] | Freq: Once | INTRAVENOUS | Status: AC
  Administered 2021-11-06: 02:00:00 1400 [IU] via INTRAVENOUS
  Filled 2021-11-06: qty 1400

## 2021-11-06 MED ORDER — HEPARIN (PORCINE) IN NACL 1000-0.9 UT/500ML-% IV SOLN
INTRAVENOUS | Status: DC | PRN
  Administered 2021-11-06: 1000 mL

## 2021-11-06 MED ORDER — ATORVASTATIN CALCIUM 40 MG PO TABS: 40.0000 mg | ORAL_TABLET | Freq: Every day | ORAL | 0 refills | Status: DC

## 2021-11-06 MED ORDER — SODIUM CHLORIDE 0.9 % WEIGHT BASED INFUSION
3.0000 mL/kg/h | INTRAVENOUS | Status: DC
  Administered 2021-11-06: 08:00:00 3 mL/kg/h via INTRAVENOUS

## 2021-11-06 MED ORDER — VERAPAMIL HCL 2.5 MG/ML IV SOLN
INTRAVENOUS | Status: DC | PRN
  Administered 2021-11-06: 2.5 mg via INTRA_ARTERIAL

## 2021-11-06 MED ORDER — VERAPAMIL HCL 2.5 MG/ML IV SOLN
INTRAVENOUS | Status: AC
  Filled 2021-11-06: qty 2

## 2021-11-06 MED ORDER — ACETAMINOPHEN 325 MG PO TABS: 650.0000 mg | ORAL_TABLET | ORAL | Status: DC | PRN

## 2021-11-06 MED ORDER — SODIUM CHLORIDE 0.9% FLUSH: 3.0000 mL | INTRAVENOUS | Status: DC | PRN

## 2021-11-06 SURGICAL SUPPLY — 10 items
CATH 5FR JL3.5 JR4 ANG PIG MP (CATHETERS) ×2 IMPLANT
DEVICE RAD TR BAND REGULAR (VASCULAR PRODUCTS) ×2 IMPLANT
DRAPE BRACHIAL (DRAPES) ×2 IMPLANT
GLIDESHEATH SLEND SS 6F .021 (SHEATH) ×2 IMPLANT
GUIDEWIRE INQWIRE 1.5J.035X260 (WIRE) IMPLANT
INQWIRE 1.5J .035X260CM (WIRE) ×3
PACK CARDIAC CATH (CUSTOM PROCEDURE TRAY) ×3 IMPLANT
PROTECTION STATION PRESSURIZED (MISCELLANEOUS) ×3
SET ATX SIMPLICITY (MISCELLANEOUS) ×2 IMPLANT
STATION PROTECTION PRESSURIZED (MISCELLANEOUS) IMPLANT

## 2021-11-06 NOTE — Progress Notes (Signed)
Dr. Nehemiah Massed in at bedside in recovery to speak with Colleen [name]. Re: cath nresults. Colleen [name]. Verbalized understanding of conversation.

## 2021-11-06 NOTE — Progress Notes (Signed)
[facility] Cardiology [facility] Encounter Note  Patient: Colleen [name] / Admit Date: 11/04/2021 / Date of Encounter: 11/06/2021, 9:26 AM   Subjective: Patient had no further chest pain overnight.  No evidence of acute coronary syndrome.  Patient did have peak troponin of 239 of unknown etiology.  Cardiac catheterization showing normal LV systolic function with ejection fraction of 60% Minimal irregularity of left anterior descending artery but no other significant atherosclerosis  Review of Systems: Positive for: None Negative for: Vision change, hearing change, syncope, dizziness, nausea, vomiting,diarrhea, bloody stool, stomach pain, cough, congestion, diaphoresis, urinary frequency, urinary pain,skin lesions, skin rashes Others previously listed  Objective: Telemetry: Normal sinus rhythm Physical Exam: Blood pressure 114/67, pulse 90, temperature 98.1 F (36.7 C), temperature source Oral, resp. rate 20, height 5' (1.524 m), weight 59.9 kg, SpO2 98 %. Body mass index is 25.78 kg/m. General: Well developed, well nourished, in no acute distress. Head: Normocephalic, atraumatic, sclera non-icteric, no xanthomas, nares are without discharge. Neck: No apparent masses Lungs: Normal respirations with no wheezes, no rhonchi, no rales , no crackles   Heart: Regular rate and rhythm, normal S1 S2, no murmur, no rub, no gallop, PMI is normal size and placement, carotid upstroke normal without bruit, jugular venous pressure normal Abdomen: Soft, non-tender, non-distended with normoactive bowel sounds. No hepatosplenomegaly. Abdominal aorta is normal size without bruit Extremities: No edema, no clubbing, no cyanosis, no ulcers,  Peripheral: 2+ radial, 2+ femoral, 2+ dorsal pedal pulses Neuro: Alert and oriented. Moves all extremities spontaneously. Psych:  Responds to questions appropriately with a normal affect.   Intake/Output Summary (Last 24 hours) at 11/06/2021 0926 Last data filed at  11/06/2021 0548 Gross per 24 hour  Intake 284.79 ml  Output --  Net 284.79 ml    Inpatient Medications:   [MAR Hold] aspirin EC  81 mg Oral Daily   [MAR Hold] atorvastatin  40 mg Oral Daily   [MAR Hold] gabapentin  300 mg Oral Daily   And   [MAR Hold] gabapentin  600 mg Oral QHS   [MAR Hold] ipratropium-albuterol  3 mL Nebulization Q6H   [MAR Hold] lidocaine  1 patch Transdermal Q24H   [MAR Hold] QUEtiapine  50 mg Oral QHS   [MAR Hold] sodium chloride flush  3 mL Intravenous Q12H   Infusions:   sodium chloride     [START ON 11/07/2021] sodium chloride     Followed by   Derrill Memo ON 11/07/2021] sodium chloride     heparin Stopped (11/06/21 0835)    Labs: Recent Labs    11/04/21 1535 11/05/21 0711  NA 133* 138  K 3.6 4.5  CL 98 103  CO2 31 29  GLUCOSE 130* 94  BUN 7* 6*  CREATININE 0.54 0.48  CALCIUM 8.5* 8.6*   No results for input(s): AST, ALT, ALKPHOS, BILITOT, PROT, ALBUMIN in the last 72 hours. Recent Labs    11/04/21 1535 11/05/21 0711  WBC 6.5 5.5  HGB 12.3 12.4  HCT 37.3 38.3  MCV 91.2 91.6  PLT 285 264   No results for input(s): CKTOTAL, CKMB, TROPONINI in the last 72 hours. Invalid input(s): POCBNP No results for input(s): HGBA1C in the last 72 hours.   Weights: Filed Weights   11/06/21 0808  Weight: 59.9 kg     Radiology/Studies:  DG Chest 2 View  Result Date: 11/04/2021 CLINICAL DATA:  Chest pain, shortness of breath increased over 1 week, asthma, COPD, smoker EXAM: CHEST - 2 VIEW COMPARISON:  10/19/2021 FINDINGS: Normal  heart size, mediastinal contours, and pulmonary vascularity. Emphysematous and bronchitic changes consistent with COPD. Chronic accentuation of basilar markings. No definite acute infiltrate, pleural effusion, or pneumothorax. No acute osseous findings. IMPRESSION: COPD changes without acute abnormalities. Electronically Signed   By: Lavonia Dana M.D.   On: 11/04/2021 16:28   DG Chest 2 View  Result Date:  10/12/2021 CLINICAL DATA:  Chest pain, back pain EXAM: CHEST - 2 VIEW COMPARISON:  Previous studies including the examination of 09/29/2021 FINDINGS: Cardiac size is within normal limits. Increase in AP diameter of chest and flattening of diaphragms suggests COPD. There are no signs of alveolar pulmonary edema or new focal infiltrates. There is improvement in aeration of both lower lung fields since 09/29/2021. There is no significant pleural effusion or pneumothorax. IMPRESSION: COPD. There are no signs of pulmonary edema or focal pulmonary consolidation. There is no pleural effusion. Electronically Signed   By: Elmer Picker M.D.   On: 10/12/2021 17:17   CT Angio Chest PE W and/or Wo Contrast  Result Date: 10/19/2021 CLINICAL DATA:  Pulmonary embolism. EXAM: CT ANGIOGRAPHY CHEST WITH CONTRAST TECHNIQUE: Multidetector CT imaging of the chest was performed using the standard protocol during bolus administration of intravenous contrast. Multiplanar CT image reconstructions and MIPs were obtained to evaluate the vascular anatomy. CONTRAST:  31mL OMNIPAQUE IOHEXOL 350 MG/ML SOLN COMPARISON:  CT 05/05/2021 FINDINGS: Cardiovascular: No filling defects within the pulmonary arteries to suggest acute pulmonary embolism. No significant vascular findings. Normal heart size. No pericardial effusion. Mediastinum/Nodes: No axillary or supraclavicular adenopathy. No mediastinal or hilar adenopathy. No pericardial fluid. Esophagus normal. Lungs/Pleura: No suspicious pulmonary nodules. Normal pleural. Airways normal. Upper Abdomen: Limited view of the liver, kidneys, pancreas are unremarkable. Normal adrenal glands. Musculoskeletal: No aggressive osseous lesion. 1. No acute pulmonary embolism. 2. No acute pulmonary parenchymal findings. Review of the MIP images confirms the above findings. Electronically Signed   By: Suzy Bouchard M.D.   On: 10/19/2021 13:39   DG Chest Portable 1 View  Result Date:  10/19/2021 CLINICAL DATA:  Shortness of breath EXAM: PORTABLE CHEST 1 VIEW COMPARISON:  Chest x-ray dated October 12, 2021; CT chest dated May 01, 2021 FINDINGS: Cardiac and mediastinal contours are within normal limits. Mild lingular opacity favored to be due to scarring when compared with most recent prior CT. No new parenchymal opacity. No evidence of pleural effusion or pneumothorax. IMPRESSION: No active disease. Electronically Signed   By: Yetta Glassman M.D.   On: 10/19/2021 11:43     Assessment and Recommendation  60 y.o. female with known risk factors cardiovascular disease having atypical chest discomfort and peak troponin of 239 with normal LV systolic function and minimal atherosclerosis by cardiac catheterization and no current evidence of congestive heart failure or acute coronary syndrome 1.  Continue medical management for risk factor modification 2.  No further cardiac diagnostics necessary at this time 3.  If ambulating well okay for discharged home from cardiac standpoint with follow-up in 1 to 2 weeks  Signed, Serafina Royals M.D. FACC

## 2021-11-06 NOTE — Progress Notes (Signed)
[place] for heparin infusion Indication: ACS/STEMI  Allergies  Allergen Reactions   Desvenlafaxine Anaphylaxis   Morphine And Related Anaphylaxis   Penicillins Anaphylaxis    Has patient had a PCN reaction causing immediate rash, facial/tongue/throat swelling, SOB or lightheadedness with hypotension: Yes Has patient had a PCN reaction causing severe rash involving mucus membranes or skin necrosis: No Has patient had a PCN reaction that required hospitalization: Unknown Has patient had a PCN reaction occurring within the last 10 years: No If all of the above answers are "NO", then may proceed with Cephalosporin use.    Prazosin Other (See Comments)   Tamoxifen Anaphylaxis   Trazodone Anaphylaxis   Clonazepam    Codeine Swelling   Duloxetine Itching   Hydroxyzine Itching   Lorazepam     Patient feels this medications makes her to sedated and wants to avoid   Paroxetine Hcl Hives   Sulfa Antibiotics Itching   Cephalexin Rash    Patient Measurements:   Heparin Dosing Weight: 46.8 kg  (per pt wt 61.2 kg from ED note 121/3/22)  Vital Signs: Temp: 98 F (36.7 C) (12/27 2353) Temp Source: Oral (12/27 2353) BP: 101/70 (12/27 2353) Pulse Rate: 85 (12/27 2353)  Labs: Recent Labs    11/04/21 1535 11/04/21 2158 11/05/21 0202 11/05/21 0711 11/05/21 1628 11/06/21 0114  HGB 12.3  --   --  12.4  --   --   HCT 37.3  --   --  38.3  --   --   PLT 285  --   --  264  --   --   APTT  --   --  46*  --   --   --   LABPROT  --   --  12.7  --   --   --   INR  --   --  1.0  --   --   --   HEPARINUNFRC  --   --   --  0.16* <0.10* 0.15*  CREATININE 0.54  --   --  0.48  --   --   TROPONINIHS 6 239*  --  108* 48*  --      CrCl cannot be calculated (Unknown ideal weight.).   Medical History: Past Medical History:  Diagnosis Date   Anxiety    Asthma    Breast cancer ([place]) 2004   left breast   Chronic back pain    COPD (chronic obstructive  pulmonary disease) (HCC)    Depression    Personal history of radiation therapy    Heparin Dosing Weight: 46.8 kg   Assessment: Pt is 60 yo female presenting to ED c/o increase SOB over past week found with slightly elevated Troponin I, trending up.  Pt with h/o Lumbar Epidural Injections with last documented on 03/21/21.  Date Time   HL Rate/Comment 12/27  0711  0.16 Subthera; 600 > 750 un/hr 12/27 1628 <0.1 Subthera; 750 > 950 un/hr 12/28 0114 0.15 subtherapeutic       Baseline Labs: aPTT - 46s INR - 1 Hgb - 12.4 Plts - 285>264  Goal of Therapy:  Heparin level 0.3-0.7 units/ml Monitor platelets by anticoagulation protocol: Yes   Plan:  Give 1400 units bolus x1; increase heparin infusion to 1100 units/hr Recheck HL in 6hrs after rate change and at least daily once consecutively therapeutic. Continue to monitor H&H and platelets daily while on heparin gtt.  Renda Rolls, PharmD, [facility] 11/06/2021 2:06 AM

## 2021-11-06 NOTE — Discharge Summary (Signed)
Physician Discharge Summary  Colleen [name] UGQ:[id number] DOB: December 24, 1960 DOA: 11/04/2021  PCP: Remi Haggard, FNP  Admit date: 11/04/2021 Discharge date: 11/06/2021  Admitted From: Home Disposition:  Home  Recommendations for Outpatient Follow-up:  Follow up with PCP in 1-2 weeks Follow up with Cardiology as scheduled  Discharge Condition:Stable CODE STATUS:Full Diet recommendation: Heart healthy   Brief/Interim Summary: 60 y.o. 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  She describes feeling a tightness in the upper part of her chest radiating to her neck almost like a muscle spasm.  She denies associated nausea, vomiting, diaphoresis, palpitations or lightheadedness.  States she has had a congested cough since Thanksgiving, productive of clear phlegm but it has not recently worsened.  She denies fever or chills.  Denies leg pain, swelling or orthopnea  Discharge Diagnoses:    Chest pain with elevated troponin, NSTEMI ruled out -Chest pain, somewhat atypical and with EKG nonacute but troponin bumped mildly - Cardiology was consulted and pt underwent cath on 12/28 -Pt noted to have minimal atherosclerosis with no further cardiac diagnostics recommended by Cardiology -Pt was clear for discharge if ambulating well per Cardiollogy     COPD exacerbation ([place])   Chronic respiratory failure with hypoxia ([place]) - DuoNebs every 6 and as needed - remained stable     Chronic pain  - Continue gabapentin, Zanaflex     GAD (generalized anxiety disorder) - Continue Seroquel   Discharge Instructions  Discharge Instructions     AMB Referral to Cardiac Rehabilitation - Phase II   Complete by: As directed    Diagnosis: Stable Angina      Allergies as of 11/06/2021        Reactions   Desvenlafaxine Anaphylaxis   Morphine And Related Anaphylaxis   Penicillins Anaphylaxis   Has patient had a PCN reaction causing immediate rash, facial/tongue/throat swelling, SOB or lightheadedness with hypotension: Yes Has patient had a PCN reaction causing severe rash involving mucus membranes or skin necrosis: No Has patient had a PCN reaction that required hospitalization: Unknown Has patient had a PCN reaction occurring within the last 10 years: No If all of the above answers are "NO", then may proceed with Cephalosporin use.   Prazosin Other (See Comments)   Tamoxifen Anaphylaxis   Trazodone Anaphylaxis   Clonazepam    Codeine Swelling   Duloxetine Itching   Hydroxyzine Itching   Lorazepam    Patient feels this medications makes her to sedated and wants to avoid   Paroxetine Hcl Hives   Sulfa Antibiotics Itching   Cephalexin Rash        Medication List     TAKE these medications    acetaminophen 500 MG tablet Commonly known as: TYLENOL Take 500 mg by mouth every 6 (six) hours as needed for headache, fever, moderate pain or mild pain.   atorvastatin 40 MG tablet Commonly known as: LIPITOR Take 1 tablet (40 mg total) by mouth daily. Start taking on: November 07, 2021   Cholecalciferol 25 MCG (1000 UT) tablet Take by mouth.   denosumab 60 MG/ML Sosy injection Commonly known as: PROLIA Inject 60 mg into the skin every 6 (six) months.   DULoxetine 60 MG capsule Commonly known as: CYMBALTA Take 60 mg by mouth 2 (two) times daily.   EPINEPHrine 0.3  mg/0.3 mL Soaj injection Commonly known as: EPI-PEN Inject 0.3 mg into the muscle as needed.   fluticasone 50 MCG/ACT nasal spray Commonly known as: FLONASE Place 2 sprays into both nostrils daily.   gabapentin 300 MG capsule Commonly known as: Neurontin Take 1-2 capsules (300-600 mg total) by mouth as directed. Take 1 capsule daily AM and 2 capsules daily at bedtime   Icy Hot 5 % Ptch Generic  drug: Menthol Apply 1 patch topically daily as needed.   ipratropium 0.02 % nebulizer solution Commonly known as: ATROVENT Take by nebulization 4 (four) times daily.   lidocaine 5 % Commonly known as: LIDODERM Place 1 patch onto the skin daily. Remove & Discard patch within 12 hours or as directed by MD   QUEtiapine 50 MG tablet Commonly known as: SEROquel Take 1 tablet (50 mg total) by mouth at bedtime.   tiZANidine 4 MG tablet Commonly known as: Zanaflex Take 1 tablet (4 mg total) by mouth every 12 (twelve) hours as needed for muscle spasms.   Trelegy Ellipta 100-62.5-25 MCG/ACT Aepb Generic drug: Fluticasone-Umeclidin-Vilant Inhale 1 puff into the lungs daily.   Ventolin HFA 108 (90 Base) MCG/ACT inhaler Generic drug: albuterol Inhale 1-2 puffs into the lungs every 4 (four) hours as needed for cough or wheezing.        Follow-up Information     Corey Skains, MD Follow up in 1 week(s).   Specialty: Cardiology Why: 1-2 weeks after discharge Contact information: [address] [facility] [place] [address] [place], FNP Follow up in 1 week(s).   Specialty: Family Medicine Why: Hospital follow up Contact information: [place] [address] [phone]                Allergies  Allergen Reactions   Desvenlafaxine Anaphylaxis   Morphine And Related Anaphylaxis   Penicillins Anaphylaxis    Has patient had a PCN reaction causing immediate rash, facial/tongue/throat swelling, SOB or lightheadedness with hypotension: Yes Has patient had a PCN reaction causing severe rash involving mucus membranes or skin necrosis: No Has patient had a PCN reaction that required hospitalization: Unknown Has patient had a PCN reaction occurring within the last 10 years: No If all of the above answers are "NO", then may proceed with Cephalosporin use.    Prazosin Other (See Comments)    Tamoxifen Anaphylaxis   Trazodone Anaphylaxis   Clonazepam    Codeine Swelling   Duloxetine Itching   Hydroxyzine Itching   Lorazepam     Patient feels this medications makes her to sedated and wants to avoid   Paroxetine Hcl Hives   Sulfa Antibiotics Itching   Cephalexin Rash    Consultations: Cardiology  Procedures/Studies: DG Chest 2 View  Result Date: 11/04/2021 CLINICAL DATA:  Chest pain, shortness of breath increased over 1 week, asthma, COPD, smoker EXAM: CHEST - 2 VIEW COMPARISON:  10/19/2021 FINDINGS: Normal heart size, mediastinal contours, and pulmonary vascularity. Emphysematous and bronchitic changes consistent with COPD. Chronic accentuation of basilar markings. No definite acute infiltrate, pleural effusion, or pneumothorax. No acute osseous findings. IMPRESSION: COPD changes without acute abnormalities. Electronically Signed   By: Lavonia Dana M.D.   On: 11/04/2021 16:28   DG Chest 2 View  Result Date: 10/12/2021 CLINICAL DATA:  Chest pain, back pain EXAM: CHEST - 2 VIEW COMPARISON:  Previous studies including the examination of 09/29/2021 FINDINGS: Cardiac size is within normal limits.  Increase in AP diameter of chest and flattening of diaphragms suggests COPD. There are no signs of alveolar pulmonary edema or new focal infiltrates. There is improvement in aeration of both lower lung fields since 09/29/2021. There is no significant pleural effusion or pneumothorax. IMPRESSION: COPD. There are no signs of pulmonary edema or focal pulmonary consolidation. There is no pleural effusion. Electronically Signed   By: Elmer Picker M.D.   On: 10/12/2021 17:17   CT Angio Chest PE W and/or Wo Contrast  Result Date: 10/19/2021 CLINICAL DATA:  Pulmonary embolism. EXAM: CT ANGIOGRAPHY CHEST WITH CONTRAST TECHNIQUE: Multidetector CT imaging of the chest was performed using the standard protocol during bolus administration of intravenous contrast. Multiplanar CT image  reconstructions and MIPs were obtained to evaluate the vascular anatomy. CONTRAST:  74mL OMNIPAQUE IOHEXOL 350 MG/ML SOLN COMPARISON:  CT 05/05/2021 FINDINGS: Cardiovascular: No filling defects within the pulmonary arteries to suggest acute pulmonary embolism. No significant vascular findings. Normal heart size. No pericardial effusion. Mediastinum/Nodes: No axillary or supraclavicular adenopathy. No mediastinal or hilar adenopathy. No pericardial fluid. Esophagus normal. Lungs/Pleura: No suspicious pulmonary nodules. Normal pleural. Airways normal. Upper Abdomen: Limited view of the liver, kidneys, pancreas are unremarkable. Normal adrenal glands. Musculoskeletal: No aggressive osseous lesion. 1. No acute pulmonary embolism. 2. No acute pulmonary parenchymal findings. Review of the MIP images confirms the above findings. Electronically Signed   By: Suzy Bouchard M.D.   On: 10/19/2021 13:39   CARDIAC CATHETERIZATION  Result Date: 11/06/2021   Mid LAD lesion is 20% stenosed.   The left ventricular systolic function is normal.   LV end diastolic pressure is normal.   The left ventricular ejection fraction is greater than 65% by visual estimate. 60 year old female with cardiovascular risk factors currently having atypical chest discomfort with elevated troponin Normal LV systolic function ejection fraction of 60% Minimal Irregularity of left anterior descending artery without evidence of significant stenoses Plan Continue risk factor modification for cardiovascular disease No further cardiac diagnostic necessary at this time Ambulation and follow-up for improvements of symptoms   DG Chest Portable 1 View  Result Date: 10/19/2021 CLINICAL DATA:  Shortness of breath EXAM: PORTABLE CHEST 1 VIEW COMPARISON:  Chest x-ray dated October 12, 2021; CT chest dated May 01, 2021 FINDINGS: Cardiac and mediastinal contours are within normal limits. Mild lingular opacity favored to be due to scarring when compared with  most recent prior CT. No new parenchymal opacity. No evidence of pleural effusion or pneumothorax. IMPRESSION: No active disease. Electronically Signed   By: Yetta Glassman M.D.   On: 10/19/2021 11:43   ECHOCARDIOGRAM COMPLETE  Result Date: 11/06/2021    ECHOCARDIOGRAM REPORT   Patient Name:   Colleen [name] Date of Exam: 11/05/2021 Medical Rec #:  [id number]      Height:       60.0 in Accession #:    [id number]     Weight:       143.5 lb Date of Birth:  Mar 01, 1961       BSA:          1.621 m Patient Age:    75 years       BP:           116/82 mmHg Patient Gender: F              HR:           88 bpm. Exam Location:  [facility] Procedure: 2D Echo, Cardiac Doppler and Color Doppler Indications:  R07.9 Chest pain  History:        Patient has no prior history of Echocardiogram examinations.                 COPD.  Sonographer:    Cresenciano Lick RDCS Referring Phys: [id number] Rudy TANG Diagnosing      Kate Sable MD Phys: IMPRESSIONS  1. Left ventricular ejection fraction, by estimation, is 50 to 55%. The left ventricle has low normal function. The left ventricle has no regional wall motion abnormalities. Left ventricular diastolic parameters are indeterminate.  2. Right ventricular systolic function is normal. The right ventricular size is normal.  3. The mitral valve is normal in structure. No evidence of mitral valve regurgitation.  4. The aortic valve was not well visualized. Aortic valve regurgitation is not visualized.  5. The inferior vena cava is dilated in size with >50% respiratory variability, suggesting right atrial pressure of 8 mmHg. FINDINGS  Left Ventricle: Left ventricular ejection fraction, by estimation, is 50 to 55%. The left ventricle has low normal function. The left ventricle has no regional wall motion abnormalities. The left ventricular internal cavity size was normal in size. There is no left ventricular hypertrophy. Left ventricular diastolic parameters are indeterminate.  Right Ventricle: The right ventricular size is normal. No increase in right ventricular wall thickness. Right ventricular systolic function is normal. Left Atrium: Left atrial size was normal in size. Right Atrium: Right atrial size was normal in size. Pericardium: There is no evidence of pericardial effusion. Mitral Valve: The mitral valve is normal in structure. No evidence of mitral valve regurgitation. Tricuspid Valve: The tricuspid valve is normal in structure. Tricuspid valve regurgitation is not demonstrated. Aortic Valve: The aortic valve was not well visualized. Aortic valve regurgitation is not visualized. Pulmonic Valve: The pulmonic valve was not well visualized. Pulmonic valve regurgitation is not visualized. Aorta: The aortic root is normal in size and structure. Venous: The inferior vena cava is dilated in size with greater than 50% respiratory variability, suggesting right atrial pressure of 8 mmHg. IAS/Shunts: No atrial level shunt detected by color flow Doppler.  LEFT VENTRICLE PLAX 2D LVIDd:         4.60 cm   Diastology LVIDs:         3.20 cm   LV e' medial:    7.62 cm/s LV PW:         0.80 cm   LV E/e' medial:  8.2 LV IVS:        0.70 cm   LV e' lateral:   8.27 cm/s LVOT diam:     2.10 cm   LV E/e' lateral: 7.6 LV SV:         63 LV SV Index:   39 LVOT Area:     3.46 cm  RIGHT VENTRICLE             IVC RV Basal diam:  3.30 cm     IVC diam: 2.30 cm RV S prime:     16.50 cm/s TAPSE (M-mode): 2.3 cm LEFT ATRIUM             Index        RIGHT ATRIUM           Index LA diam:        4.10 cm 2.53 cm/m   RA Area:     10.90 cm LA Vol (A2C):   25.9 ml 15.98 ml/m  RA Volume:   26.90 ml  16.59  ml/m LA Vol (A4C):   29.4 ml 18.14 ml/m LA Biplane Vol: 28.5 ml 17.58 ml/m  AORTIC VALVE LVOT Vmax:   91.70 cm/s LVOT Vmean:  66.700 cm/s LVOT VTI:    0.183 m  AORTA Ao Root diam: 3.30 cm MITRAL VALVE MV Area (PHT): 3.72 cm    SHUNTS MV Decel Time: 204 msec    Systemic VTI:  0.18 m MV E velocity: 62.60 cm/s   Systemic Diam: 2.10 cm MV A velocity: 73.30 cm/s MV E/A ratio:  0.85 Kate Sable MD Electronically signed by Kate Sable MD Signature Date/Time: 11/06/2021/4:35:50 PM    Final     Subjective: Feeling anxious, states she is surprised her cath is clean  Discharge Exam: Vitals:   11/06/21 1412 11/06/21 1556  BP:  (!) 128/51  Pulse:  89  Resp:    Temp:  98.3 F (36.8 C)  SpO2: 93% 95%   Vitals:   11/06/21 1146 11/06/21 1150 11/06/21 1412 11/06/21 1556  BP: 134/63 (!) 99/29  (!) 128/51  Pulse: 83 89  89  Resp: 17 18    Temp: 98.2 F (36.8 C)   98.3 F (36.8 C)  TempSrc: Oral     SpO2: 96%  93% 95%  Weight:      Height:        General: Pt is alert, awake, not in acute distress Cardiovascular: RRR, S1/S2 + Respiratory: CTA bilaterally, no wheezing, no rhonchi Abdominal: Soft, NT, ND, bowel sounds + Extremities: no edema, no cyanosis   The results of significant diagnostics from this hospitalization (including imaging, microbiology, ancillary and laboratory) are listed below for reference.     Microbiology: Recent Results (from the past 240 hour(s))  Resp Panel by RT-PCR (Flu A&B, Covid) Nasopharyngeal Swab     Status: None   Collection Time: 11/05/21 12:07 AM   Specimen: Nasopharyngeal Swab; Nasopharyngeal(NP) swabs in vial transport medium  Result Value Ref Range Status   SARS Coronavirus 2 by RT PCR NEGATIVE NEGATIVE Final    Comment: (NOTE) SARS-CoV-2 target nucleic acids are NOT DETECTED.  The SARS-CoV-2 RNA is generally detectable in upper respiratory specimens during the acute phase of infection. The lowest concentration of SARS-CoV-2 viral copies this assay can detect is 138 copies/mL. A negative result does not preclude SARS-Cov-2 infection and should not be used as the sole basis for treatment or other patient management decisions. A negative result may occur with  improper specimen collection/handling, submission of specimen other than  nasopharyngeal swab, presence of viral mutation(s) within the areas targeted by this assay, and inadequate number of viral copies(<138 copies/mL). A negative result must be combined with clinical observations, patient history, and epidemiological information. The expected result is Negative.  Fact Sheet for Patients:  EntrepreneurPulse.com.au  Fact Sheet for Healthcare Providers:  IncredibleEmployment.be  This test is no t yet approved or cleared by the [place] FDA and  has been authorized for detection and/or diagnosis of SARS-CoV-2 by FDA under an Emergency Use Authorization (EUA). This EUA will remain  in effect (meaning this test can be used) for the duration of the COVID-19 declaration under Section 564(b)(1) of the Act, 21 U.S.C.section 360bbb-3(b)(1), unless the authorization is terminated  or revoked sooner.       Influenza A by PCR NEGATIVE NEGATIVE Final   Influenza B by PCR NEGATIVE NEGATIVE Final    Comment: (NOTE) The Xpert Xpress SARS-CoV-2/FLU/RSV plus assay is intended as an aid in the diagnosis of influenza from Nasopharyngeal swab specimens  and should not be used as a sole basis for treatment. Nasal washings and aspirates are unacceptable for Xpert Xpress SARS-CoV-2/FLU/RSV testing.  Fact Sheet for Patients: EntrepreneurPulse.com.au  Fact Sheet for Healthcare Providers: IncredibleEmployment.be  This test is not yet approved or cleared by the [place] FDA and has been authorized for detection and/or diagnosis of SARS-CoV-2 by FDA under an Emergency Use Authorization (EUA). This EUA will remain in effect (meaning this test can be used) for the duration of the COVID-19 declaration under Section 564(b)(1) of the Act, 21 U.S.C. section 360bbb-3(b)(1), unless the authorization is terminated or revoked.  Performed at [facility], [address] [place], [place]  [id number]      Labs: BNP (last 3 results) Recent Labs    10/19/21 1120  BNP 9.3   Basic Metabolic Panel: Recent Labs  Lab 11/04/21 1535 11/05/21 0711  NA 133* 138  K 3.6 4.5  CL 98 103  CO2 31 29  GLUCOSE 130* 94  BUN 7* 6*  CREATININE 0.54 0.48  CALCIUM 8.5* 8.6*   Liver Function Tests: No results for input(s): AST, ALT, ALKPHOS, BILITOT, PROT, ALBUMIN in the last 168 hours. No results for input(s): LIPASE, AMYLASE in the last 168 hours. No results for input(s): AMMONIA in the last 168 hours. CBC: Recent Labs  Lab 11/04/21 1535 11/05/21 0711  WBC 6.5 5.5  HGB 12.3 12.4  HCT 37.3 38.3  MCV 91.2 91.6  PLT 285 264   Cardiac Enzymes: No results for input(s): CKTOTAL, CKMB, CKMBINDEX, TROPONINI in the last 168 hours. BNP: Invalid input(s): POCBNP CBG: No results for input(s): GLUCAP in the last 168 hours. D-Dimer Recent Labs    11/05/21 0711  DDIMER 0.40   Hgb A1c No results for input(s): HGBA1C in the last 72 hours. Lipid Profile Recent Labs    11/05/21 0711  CHOL 180  HDL 67  LDLCALC 103*  TRIG 49  CHOLHDL 2.7   Thyroid function studies No results for input(s): TSH, T4TOTAL, T3FREE, THYROIDAB in the last 72 hours.  Invalid input(s): FREET3 Anemia work up No results for input(s): VITAMINB12, FOLATE, FERRITIN, TIBC, IRON, RETICCTPCT in the last 72 hours. Urinalysis    Component Value Date/Time   COLORURINE YELLOW (A) 10/12/2018 0654   APPEARANCEUR CLEAR (A) 10/12/2018 0654   LABSPEC 1.015 10/12/2018 0654   PHURINE 6.0 10/12/2018 0654   GLUCOSEU 50 (A) 10/12/2018 0654   HGBUR NEGATIVE 10/12/2018 0654   BILIRUBINUR NEGATIVE 10/12/2018 0654   KETONESUR 20 (A) 10/12/2018 0654   PROTEINUR NEGATIVE 10/12/2018 0654   NITRITE NEGATIVE 10/12/2018 0654   LEUKOCYTESUR TRACE (A) 10/12/2018 0654   Sepsis Labs Invalid input(s): PROCALCITONIN,  WBC,  LACTICIDVEN Microbiology Recent Results (from the past 240 hour(s))  Resp Panel by RT-PCR (Flu A&B,  Covid) Nasopharyngeal Swab     Status: None   Collection Time: 11/05/21 12:07 AM   Specimen: Nasopharyngeal Swab; Nasopharyngeal(NP) swabs in vial transport medium  Result Value Ref Range Status   SARS Coronavirus 2 by RT PCR NEGATIVE NEGATIVE Final    Comment: (NOTE) SARS-CoV-2 target nucleic acids are NOT DETECTED.  The SARS-CoV-2 RNA is generally detectable in upper respiratory specimens during the acute phase of infection. The lowest concentration of SARS-CoV-2 viral copies this assay can detect is 138 copies/mL. A negative result does not preclude SARS-Cov-2 infection and should not be used as the sole basis for treatment or other patient management decisions. A negative result may occur with  improper specimen collection/handling, submission  of specimen other than nasopharyngeal swab, presence of viral mutation(s) within the areas targeted by this assay, and inadequate number of viral copies(<138 copies/mL). A negative result must be combined with clinical observations, patient history, and epidemiological information. The expected result is Negative.  Fact Sheet for Patients:  EntrepreneurPulse.com.au  Fact Sheet for Healthcare Providers:  IncredibleEmployment.be  This test is no t yet approved or cleared by the [place] FDA and  has been authorized for detection and/or diagnosis of SARS-CoV-2 by FDA under an Emergency Use Authorization (EUA). This EUA will remain  in effect (meaning this test can be used) for the duration of the COVID-19 declaration under Section 564(b)(1) of the Act, 21 U.S.C.section 360bbb-3(b)(1), unless the authorization is terminated  or revoked sooner.       Influenza A by PCR NEGATIVE NEGATIVE Final   Influenza B by PCR NEGATIVE NEGATIVE Final    Comment: (NOTE) The Xpert Xpress SARS-CoV-2/FLU/RSV plus assay is intended as an aid in the diagnosis of influenza from Nasopharyngeal swab specimens and should  not be used as a sole basis for treatment. Nasal washings and aspirates are unacceptable for Xpert Xpress SARS-CoV-2/FLU/RSV testing.  Fact Sheet for Patients: EntrepreneurPulse.com.au  Fact Sheet for Healthcare Providers: IncredibleEmployment.be  This test is not yet approved or cleared by the [place] FDA and has been authorized for detection and/or diagnosis of SARS-CoV-2 by FDA under an Emergency Use Authorization (EUA). This EUA will remain in effect (meaning this test can be used) for the duration of the COVID-19 declaration under Section 564(b)(1) of the Act, 21 U.S.C. section 360bbb-3(b)(1), unless the authorization is terminated or revoked.  Performed at [facility], [address] [place], [place] [address]    Time spent: 82min  SIGNED:   Marylu Lund, MD  [facility] 11/06/2021, 5:07 PM  If 7PM-7AM, please contact night-coverage

## 2021-11-12 ENCOUNTER — Encounter: Payer: Self-pay | Admitting: Psychiatry

## 2021-11-12 ENCOUNTER — Telehealth (INDEPENDENT_AMBULATORY_CARE_PROVIDER_SITE_OTHER): Admitting: Psychiatry

## 2021-11-12 ENCOUNTER — Other Ambulatory Visit: Payer: Self-pay

## 2021-11-12 DIAGNOSIS — F330 Major depressive disorder, recurrent, mild: Secondary | ICD-10-CM | POA: Diagnosis not present

## 2021-11-12 DIAGNOSIS — G4701 Insomnia due to medical condition: Secondary | ICD-10-CM | POA: Diagnosis not present

## 2021-11-12 DIAGNOSIS — F411 Generalized anxiety disorder: Secondary | ICD-10-CM | POA: Diagnosis not present

## 2021-11-12 DIAGNOSIS — F4310 Post-traumatic stress disorder, unspecified: Secondary | ICD-10-CM

## 2021-11-12 DIAGNOSIS — F3342 Major depressive disorder, recurrent, in full remission: Secondary | ICD-10-CM

## 2021-11-12 DIAGNOSIS — F17200 Nicotine dependence, unspecified, uncomplicated: Secondary | ICD-10-CM

## 2021-11-12 NOTE — Progress Notes (Signed)
Virtual Visit via Video Note  I connected with Colleen [name] on 11/13/21 at  4:40 PM EST by a video enabled telemedicine application and verified that I am speaking with the correct person using two identifiers.  Location Provider Location : [facility] Patient Location : Home  Participants: Patient , Spouse,Provider    I discussed the limitations of evaluation and management by telemedicine and the availability of in person appointments. The patient expressed understanding and agreed to proceed.   I discussed the assessment and treatment plan with the patient. The patient was provided an opportunity to ask questions and all were answered. The patient agreed with the plan and demonstrated an understanding of the instructions.   The patient was advised to call back or seek an in-person evaluation if the symptoms worsen or if the condition fails to improve as anticipated.   [facility] MD OP Progress Note  11/13/2021 9:01 AM Colleen [name]  MRN:  [id number]  Chief Complaint:  Chief Complaint   Follow-up; Anxiety; Depression    HPI: Colleen [name] is a 61 year old Caucasian female, married, lives in [place], has a history of PTSD, MDD, insomnia, chronic back pain, COPD, radiation therapy per history, surgical history of nephrectomy, mastectomy, tubal ligation was evaluated by telemedicine today.  Patient status post medical unit admission dated 11/04/2021 - 11/06/2021-patient presented with chest pain, associated with elevated troponin, NSTEMI ruled out.  Patient had cardiac cath on 12/28-was noted to have minimal atherosclerosis.  Patient to follow up with cardiology as well as primary care provider.  Patient today in session appeared to be anxious, distressed mostly because of her pain.  Reported her pain as a 10 out of 10 and its all over her back.  Patient seemed irritable and frustrated mostly because of the pain.    Patient no longer taking Cymbalta, reports continues to be compliant on the  Seroquel, takes only a 25 mg although prescribed 50 mg at bedtime.  She reports she is not interested in making any changes with her medications.  Would like to just follow up with her therapist for now.  Patient denies any suicidality.  Patient denies any other concerns today.  Visit Diagnosis:    ICD-10-CM   1. MDD (major depressive disorder), recurrent episode, mild ([place])  F33.0     2. GAD (generalized anxiety disorder)  F41.1     3. PTSD (post-traumatic stress disorder)  F43.10     4. Insomnia due to medical condition  G47.01    COPD    5. Tobacco use disorder  F17.200       Past Psychiatric History: Reviewed past psychiatric history from progress note on 03/04/2021.  Past trials of Paxil, Klonopin, trazodone, prazosin, desvenlafaxine, lorazepam.  Past Medical History:  Past Medical History:  Diagnosis Date   Anxiety    Asthma    Breast cancer ([place]) 2004   left breast   Chronic back pain    COPD (chronic obstructive pulmonary disease) ([place])    Depression    Personal history of radiation therapy     Past Surgical History:  Procedure Laterality Date   BREAST BIOPSY Left 2004   positive   BREAST BIOPSY Right    neg   BREAST LUMPECTOMY Left 2004   positive   LEFT HEART CATH AND CORONARY ANGIOGRAPHY N/A 11/06/2021   Procedure: LEFT HEART CATH AND CORONARY ANGIOGRAPHY;  Surgeon: Corey Skains, MD;  Location: [facility];  Service: Cardiovascular;  Laterality: N/A;   MASTECTOMY Left  MASTECTOMY     NEPHRECTOMY Left    TUBAL LIGATION      Family Psychiatric History: Reviewed family psychiatric history from progress note on 03/04/2021  Family History:  Family History  Problem Relation Age of Onset   Breast cancer Cousin    Breast cancer Cousin    Bipolar disorder Daughter    Drug abuse Daughter    Alcohol abuse Maternal Grandfather     Social History: Reviewed social history from progress note on 03/04/2021 Social History   Socioeconomic  History   Marital status: Married    Spouse name: Not on file   Number of children: 2   Years of education: GED   Highest education level: Not on file  Occupational History   Not on file  Tobacco Use   Smoking status: Every Day    Packs/day: 0.50    Types: Cigarettes   Smokeless tobacco: Never  Vaping Use   Vaping Use: Never used  Substance and Sexual Activity   Alcohol use: Yes   Drug use: Never   Sexual activity: Not Currently  Other Topics Concern   Not on file  Social History Narrative   Not on file   Social Determinants of Health   Financial Resource Strain: Not on file  Food Insecurity: Not on file  Transportation Needs: Not on file  Physical Activity: Not on file  Stress: Not on file  Social Connections: Not on file    Allergies:  Allergies  Allergen Reactions   Desvenlafaxine Anaphylaxis   Morphine And Related Anaphylaxis   Penicillins Anaphylaxis    Has patient had a PCN reaction causing immediate rash, facial/tongue/throat swelling, SOB or lightheadedness with hypotension: Yes Has patient had a PCN reaction causing severe rash involving mucus membranes or skin necrosis: No Has patient had a PCN reaction that required hospitalization: Unknown Has patient had a PCN reaction occurring within the last 10 years: No If all of the above answers are "NO", then may proceed with Cephalosporin use.    Prazosin Other (See Comments)   Tamoxifen Anaphylaxis   Trazodone Anaphylaxis   Clonazepam    Codeine Swelling   Duloxetine Itching   Hydroxyzine Itching   Lorazepam     Patient feels this medications makes her to sedated and wants to avoid   Paroxetine Hcl Hives   Sulfa Antibiotics Itching   Cephalexin Rash    Metabolic Disorder Labs: Lab Results  Component Value Date   HGBA1C 5.4 10/12/2018   MPG 108.28 10/12/2018   No results found for: PROLACTIN Lab Results  Component Value Date   CHOL 180 11/05/2021   TRIG 49 11/05/2021   HDL 67 11/05/2021    CHOLHDL 2.7 11/05/2021   VLDL 10 11/05/2021   LDLCALC 103 (H) 11/05/2021   Lab Results  Component Value Date   TSH 0.864 10/21/2021   TSH 0.616 03/05/2021    Therapeutic Level Labs: No results found for: LITHIUM No results found for: VALPROATE No components found for:  CBMZ  Current Medications: Current Outpatient Medications  Medication Sig Dispense Refill   acetaminophen (TYLENOL) 500 MG tablet Take 500 mg by mouth every 6 (six) hours as needed for headache, fever, moderate pain or mild pain.     atorvastatin (LIPITOR) 40 MG tablet Take 1 tablet (40 mg total) by mouth daily. 30 tablet 0   Cholecalciferol 25 MCG (1000 UT) tablet Take by mouth.     denosumab (PROLIA) 60 MG/ML SOSY injection Inject 60 mg into the skin  every 6 (six) months.     DULoxetine (CYMBALTA) 60 MG capsule Take 60 mg by mouth 2 (two) times daily. (Patient not taking: Reported on 11/05/2021)     EPINEPHrine 0.3 mg/0.3 mL IJ SOAJ injection Inject 0.3 mg into the muscle as needed.     fluticasone (FLONASE) 50 MCG/ACT nasal spray Place 2 sprays into both nostrils daily. 9.9 mL 0   Fluticasone-Umeclidin-Vilant (TRELEGY ELLIPTA) 100-62.5-25 MCG/INH AEPB Inhale 1 puff into the lungs daily.     gabapentin (NEURONTIN) 300 MG capsule Take 1-2 capsules (300-600 mg total) by mouth as directed. Take 1 capsule daily AM and 2 capsules daily at bedtime 270 capsule 0   ipratropium (ATROVENT) 0.02 % nebulizer solution Take by nebulization 4 (four) times daily.     lidocaine (LIDODERM) 5 % Place 1 patch onto the skin daily. Remove & Discard patch within 12 hours or as directed by MD 30 patch 0   Menthol (ICY HOT) 5 % PTCH Apply 1 patch topically daily as needed.     QUEtiapine (SEROQUEL) 50 MG tablet Take 1 tablet (50 mg total) by mouth at bedtime. 30 tablet 0   VENTOLIN HFA 108 (90 Base) MCG/ACT inhaler Inhale 1-2 puffs into the lungs every 4 (four) hours as needed for cough or wheezing.     No current facility-administered  medications for this visit.     Musculoskeletal: Strength & Muscle Tone:  UTA Gait & Station:  Seated Patient leans: Backward  Psychiatric Specialty Exam: Review of Systems  Musculoskeletal:  Positive for back pain.  Psychiatric/Behavioral:  The patient is nervous/anxious.   All other systems reviewed and are negative.  There were no vitals taken for this visit.There is no height or weight on file to calculate BMI.  General Appearance: Casual  Eye Contact:  Fair  Speech:  Clear and Coherent  Volume:  Normal  Mood:  Anxious  Affect:  Congruent  Thought Process:  Goal Directed and Descriptions of Associations: Intact  Orientation:  Full (Time, Place, and Person)  Thought Content: Logical   Suicidal Thoughts:  No  Homicidal Thoughts:  No  Memory:  Immediate;   Fair Recent;   Fair Remote;   Fair  Judgement:  Fair  Insight:  Fair  Psychomotor Activity:  Normal  Concentration:  Concentration: Fair and Attention Span: Fair  Recall:  [employer] of Knowledge: Fair  Language: Fair  Akathisia:  No  Handed:  Right  AIMS (if indicated): not done  Assets:  Communication Skills Desire for Improvement Social Support Transportation  ADL's:  Intact  Cognition: WNL  Sleep:  Fair   Screenings: GAD-7    Flowsheet Row Video Visit from 10/23/2021 in [facility] Video Visit from 06/19/2021 in [facility] Video Visit from 04/03/2021 in [facility] Video Visit from 03/04/2021 in [facility]  Total GAD-7 Score 2 3 6 17       PHQ2-9    Flowsheet Row Video Visit from 11/12/2021 in [facility] Video Visit from 10/23/2021 in [facility] Video Visit from 06/19/2021 in [facility] Procedure visit from 06/17/2021 in [facility] Video Visit from 06/05/2021 in  [facility]  PHQ-2 Total Score 1 0 1 2 6   PHQ-9 Total Score -- -- 2 5 13       Flowsheet Row ED to Hosp-Admission (Discharged) from 11/04/2021 in [facility] Video Visit  from 10/23/2021 in [facility] ED to Hosp-Admission (Discharged) from 10/19/2021 in [facility] CATEGORY No Risk Moderate Risk No Risk        Assessment and Plan: Layaan Mott is a 61 year old Caucasian female, unemployed, married, has a history of anxiety, MDD, PTSD, chronic pain, recent discharge from inpatient medical unit for chest pain with troponin elevation, currently struggles with significant pain which does have an impact on her mood.  Patient declines any medication changes today.  Discussed plan as noted below.  Plan MDD - unstable likely due to medical illness, pain Seroquel as prescribed.  Although prescribed Seroquel 50 mg p.o. nightly, reports she has been taking only 25 mg at bedtime Continue CBT with Ms. Shary Decamp Discussed referral for [facility] in the future. Also could consider changing medications, patient had genetic testing done.  I have reviewed genetic testing results.  GAD-unstable due to pain She is not interested in making medication changes today. Advised to follow up with pain provider for pain management Gabapentin 300 mg p.o. daily in the morning and 600 mg p.o. nightly Seroquel as prescribed Continue CBT  Insomnia-stable Continue Seroquel as prescribed Gabapentin 300 mg p.o. daily in the morning and 600 mg at night  Tobacco use disorder-unstable Patient does not want to quit.  I have reviewed notes per most recent inpatient medical unit admission dated 11/04/2021-per Dr.Chiu as noted above.  Patient advised to call back to schedule an appointment with writer for follow-up.  Patient also advised to follow up with cardiology, primary care as well as her  pain management provider for management of her significant pain.  Follow-up in clinic in 3 to 4 weeks or sooner if needed.  This note was generated in part or whole with voice recognition software. Voice recognition is usually quite accurate but there are transcription errors that can and very often do occur. I apologize for any typographical errors that were not detected and corrected.       Ursula Alert, MD 11/13/2021, 9:01 AM

## 2021-11-18 DIAGNOSIS — I6523 Occlusion and stenosis of bilateral carotid arteries: Secondary | ICD-10-CM | POA: Insufficient documentation

## 2021-11-18 DIAGNOSIS — E782 Mixed hyperlipidemia: Secondary | ICD-10-CM | POA: Insufficient documentation

## 2021-11-18 DIAGNOSIS — I2510 Atherosclerotic heart disease of native coronary artery without angina pectoris: Secondary | ICD-10-CM | POA: Insufficient documentation

## 2021-11-19 ENCOUNTER — Other Ambulatory Visit: Payer: Self-pay

## 2021-11-19 ENCOUNTER — Encounter: Payer: Self-pay | Admitting: Psychiatry

## 2021-11-19 ENCOUNTER — Telehealth (HOSPITAL_COMMUNITY): Payer: Self-pay | Admitting: Psychiatry

## 2021-11-19 ENCOUNTER — Telehealth (INDEPENDENT_AMBULATORY_CARE_PROVIDER_SITE_OTHER): Admitting: Psychiatry

## 2021-11-19 DIAGNOSIS — G4701 Insomnia due to medical condition: Secondary | ICD-10-CM

## 2021-11-19 DIAGNOSIS — Z9189 Other specified personal risk factors, not elsewhere classified: Secondary | ICD-10-CM | POA: Insufficient documentation

## 2021-11-19 DIAGNOSIS — F411 Generalized anxiety disorder: Secondary | ICD-10-CM

## 2021-11-19 DIAGNOSIS — F4310 Post-traumatic stress disorder, unspecified: Secondary | ICD-10-CM

## 2021-11-19 DIAGNOSIS — F17200 Nicotine dependence, unspecified, uncomplicated: Secondary | ICD-10-CM

## 2021-11-19 DIAGNOSIS — F330 Major depressive disorder, recurrent, mild: Secondary | ICD-10-CM | POA: Diagnosis not present

## 2021-11-19 DIAGNOSIS — F3342 Major depressive disorder, recurrent, in full remission: Secondary | ICD-10-CM

## 2021-11-19 MED ORDER — GABAPENTIN 300 MG PO CAPS: 300.0000 mg | ORAL_CAPSULE | ORAL | 0 refills | Status: DC

## 2021-11-19 MED ORDER — QUETIAPINE FUMARATE 50 MG PO TABS: 25.0000 mg | ORAL_TABLET | Freq: Every day | ORAL | 0 refills | Status: DC

## 2021-11-19 MED ORDER — DIAZEPAM 2 MG PO TABS: 2.0000 mg | ORAL_TABLET | Freq: Every day | ORAL | 0 refills | Status: DC | PRN

## 2021-11-19 NOTE — Progress Notes (Signed)
Virtual Visit via Video Note  I connected with Colleen [name] on 11/19/21 at  1:00 PM EST by a video enabled telemedicine application and verified that I am speaking with the correct person using two identifiers. Location Provider Location : [facility] Patient Location : Home  Participants: Patient , Provider    I discussed the limitations of evaluation and management by telemedicine and the availability of in person appointments. The patient expressed understanding and agreed to proceed.    I discussed the assessment and treatment plan with the patient. The patient was provided an opportunity to ask questions and all were answered. The patient agreed with the plan and demonstrated an understanding of the instructions.   The patient was advised to call back or seek an in-person evaluation if the symptoms worsen or if the condition fails to improve as anticipated.  [facility] MD OP Progress Note  11/19/2021 2:35 PM Colleen [name]  MRN:  [id number]  Chief Complaint:  Chief Complaint   Follow-up; Anxiety; Depression    HPI: Colleen [name] is a 61 year old Caucasian female, married, lives in [place], has a history of PTSD, MDD, insomnia, chronic back pain, COPD, radiation therapy per history, surgical history of nephrectomy, mastectomy, tubal ligation was evaluated by telemedicine today.  Patient is status post medical unit admission dated 11/04/2021 - 11/06/2021-status post cardiac cath on 12/28.  Patient had follow-up cardiology visit with Dr. Jerilynn Birkenhead have reviewed notes dated 11/18/2021-patient with difficulty with chronic dyspnea, shortness of breath improving over the last 2 weeks.  Patient to continue cardiac rehabilitation as well as investigation should include lung disease or sleep apnea."  Patient today reports she seems to have a lot of anxiety since the past few days.  She reports she is crawling out of her skin and is in a panic mode most of the time.  Patient reports she has been having  difficulty sleeping for more than 4 hours at night due to her shortness of breath.  She is currently on oxygen which does not seem to help much with the sleep part.  Patient reports she stopped smoking 2 days ago and since then anxiety may have worsened.  Likely the panic mood and the feeling of crawling out of her skin also related to smoking cessation.  This was discussed with patient.  Patient is currently taking the gabapentin as well as lower dosage of Seroquel.  Currently not on an SNRI, discontinued Cymbalta a month ago per her report.  Patient reports she continues to have psychotherapy sessions with her therapist and it is going well.  Patient agreeable to referral for intensive outpatient program when writer discussed the same with her.  Patient with prolonged QT interval on recent EKG dated 11/05/2021-will benefit from repeat EKG prior to further medication changes.  Patient does report previous side effects to medications like Klonopin, lorazepam.  Reports she may have tolerated Valium in the past and is agreeable to a trial.  Patient denies any other concerns today.  Visit Diagnosis:    ICD-10-CM   1. MDD (major depressive disorder), recurrent episode, mild (HCC)  F33.0 gabapentin (NEURONTIN) 300 MG capsule    QUEtiapine (SEROQUEL) 50 MG tablet    2. GAD (generalized anxiety disorder)  F41.1 diazepam (VALIUM) 2 MG tablet    gabapentin (NEURONTIN) 300 MG capsule    QUEtiapine (SEROQUEL) 50 MG tablet    3. PTSD (post-traumatic stress disorder)  F43.10 diazepam (VALIUM) 2 MG tablet    QUEtiapine (SEROQUEL) 50 MG tablet  4. Insomnia due to medical condition  G47.01 diazepam (VALIUM) 2 MG tablet   copd    5. Tobacco use disorder  F17.200     6. At risk for prolonged QT interval syndrome  Z91.89 EKG 12-Lead      Past Psychiatric History: Reviewed past psychiatric history from progress note on 03/04/2021.  Past trials of Paxil, Klonopin, trazodone, prazosin, desvenlafaxine,  lorazepam.  Past Medical History:  Past Medical History:  Diagnosis Date   Anxiety    Asthma    Breast cancer ([place]) 2004   left breast   Chronic back pain    COPD (chronic obstructive pulmonary disease) ([place])    Depression    Personal history of radiation therapy     Past Surgical History:  Procedure Laterality Date   BREAST BIOPSY Left 2004   positive   BREAST BIOPSY Right    neg   BREAST LUMPECTOMY Left 2004   positive   LEFT HEART CATH AND CORONARY ANGIOGRAPHY N/A 11/06/2021   Procedure: LEFT HEART CATH AND CORONARY ANGIOGRAPHY;  Surgeon: Corey Skains, MD;  Location: [facility];  Service: Cardiovascular;  Laterality: N/A;   MASTECTOMY Left    MASTECTOMY     NEPHRECTOMY Left    TUBAL LIGATION      Family Psychiatric History: Reviewed family psychiatric history from progress note on 03/04/2021.  Family History:  Family History  Problem Relation Age of Onset   Breast cancer Cousin    Breast cancer Cousin    Bipolar disorder Daughter    Drug abuse Daughter    Alcohol abuse Maternal Grandfather     Social History: Reviewed social history from progress note on 03/04/2021. Social History   Socioeconomic History   Marital status: Married    Spouse name: Not on file   Number of children: 2   Years of education: GED   Highest education level: Not on file  Occupational History   Not on file  Tobacco Use   Smoking status: Former    Packs/day: 0.50    Types: Cigarettes   Smokeless tobacco: Never   Tobacco comments:    Reports quit smoking 2 days ago- 11/19/2021  Vaping Use   Vaping Use: Never used  Substance and Sexual Activity   Alcohol use: Yes   Drug use: Never   Sexual activity: Not Currently  Other Topics Concern   Not on file  Social History Narrative   Not on file   Social Determinants of Health   Financial Resource Strain: Not on file  Food Insecurity: Not on file  Transportation Needs: Not on file  Physical Activity: Not on file   Stress: Not on file  Social Connections: Not on file    Allergies:  Allergies  Allergen Reactions   Desvenlafaxine Anaphylaxis   Morphine And Related Anaphylaxis   Penicillins Anaphylaxis    Has patient had a PCN reaction causing immediate rash, facial/tongue/throat swelling, SOB or lightheadedness with hypotension: Yes Has patient had a PCN reaction causing severe rash involving mucus membranes or skin necrosis: No Has patient had a PCN reaction that required hospitalization: Unknown Has patient had a PCN reaction occurring within the last 10 years: No If all of the above answers are "NO", then may proceed with Cephalosporin use.    Prazosin Other (See Comments)   Tamoxifen Anaphylaxis   Trazodone Anaphylaxis   Clonazepam    Codeine Swelling   Duloxetine Itching   Hydroxyzine Itching   Lorazepam  Patient feels this medications makes her to sedated and wants to avoid   Paroxetine Hcl Hives   Sulfa Antibiotics Itching   Cephalexin Rash    Metabolic Disorder Labs: Lab Results  Component Value Date   HGBA1C 5.4 10/12/2018   MPG 108.28 10/12/2018   No results found for: PROLACTIN Lab Results  Component Value Date   CHOL 180 11/05/2021   TRIG 49 11/05/2021   HDL 67 11/05/2021   CHOLHDL 2.7 11/05/2021   VLDL 10 11/05/2021   LDLCALC 103 (H) 11/05/2021   Lab Results  Component Value Date   TSH 0.864 10/21/2021   TSH 0.616 03/05/2021    Therapeutic Level Labs: No results found for: LITHIUM No results found for: VALPROATE No components found for:  CBMZ  Current Medications: Current Outpatient Medications  Medication Sig Dispense Refill   diazepam (VALIUM) 2 MG tablet Take 1 tablet (2 mg total) by mouth daily as needed for anxiety. For severe panic symptoms 15 tablet 0   ipratropium (ATROVENT) 0.02 % nebulizer solution Inhale into the lungs.     lidocaine (LIDODERM) 5 % Place onto the skin.     acetaminophen (TYLENOL) 500 MG tablet Take 500 mg by mouth every 6  (six) hours as needed for headache, fever, moderate pain or mild pain.     acetaminophen (TYLENOL) 500 MG tablet Take by mouth.     atorvastatin (LIPITOR) 40 MG tablet Take 1 tablet (40 mg total) by mouth daily. (Patient not taking: Reported on 11/19/2021) 30 tablet 0   Cholecalciferol 25 MCG (1000 UT) tablet Take by mouth.     Cholecalciferol 25 MCG (1000 UT) tablet Take by mouth.     denosumab (PROLIA) 60 MG/ML SOSY injection Inject 60 mg into the skin every 6 (six) months.     EPINEPHrine 0.3 mg/0.3 mL IJ SOAJ injection Inject 0.3 mg into the muscle as needed.     fluticasone (FLONASE) 50 MCG/ACT nasal spray Place 2 sprays into both nostrils daily. 9.9 mL 0   Fluticasone-Umeclidin-Vilant (TRELEGY ELLIPTA) 100-62.5-25 MCG/INH AEPB Inhale 1 puff into the lungs daily.     gabapentin (NEURONTIN) 300 MG capsule Take 1-2 capsules (300-600 mg total) by mouth as directed. Take 1 capsule daily AM , 1 capsule at noon and 2 capsules daily at bedtime 120 capsule 0   ipratropium (ATROVENT) 0.02 % nebulizer solution Take by nebulization 4 (four) times daily.     lidocaine (LIDODERM) 5 % Place 1 patch onto the skin daily. Remove & Discard patch within 12 hours or as directed by MD 30 patch 0   Menthol (ICY HOT) 5 % PTCH Apply 1 patch topically daily as needed.     QUEtiapine (SEROQUEL) 50 MG tablet Take 0.5 tablets (25 mg total) by mouth at bedtime. 15 tablet 0   VENTOLIN HFA 108 (90 Base) MCG/ACT inhaler Inhale 1-2 puffs into the lungs every 4 (four) hours as needed for cough or wheezing.     No current facility-administered medications for this visit.     Musculoskeletal: Strength & Muscle Tone:  UTA Gait & Station:  Seated Patient leans: N/A  Psychiatric Specialty Exam: Review of Systems  Respiratory:  Positive for shortness of breath.   Musculoskeletal:  Positive for back pain.  Psychiatric/Behavioral:  Positive for decreased concentration, dysphoric mood and sleep disturbance. The patient is  nervous/anxious.   All other systems reviewed and are negative.  There were no vitals taken for this visit.There is no height or weight on file  to calculate BMI.  General Appearance: Casual  Eye Contact:  Minimal  Speech:  Clear and Coherent  Volume:  Normal  Mood:  Anxious and Dysphoric  Affect:  Congruent  Thought Process:  Goal Directed and Descriptions of Associations: Intact  Orientation:  Full (Time, Place, and Person)  Thought Content: Logical   Suicidal Thoughts:  No  Homicidal Thoughts:  No  Memory:  Immediate;   Fair Recent;   Fair Remote;   Fair  Judgement:  Fair  Insight:  Fair  Psychomotor Activity:  Normal  Concentration:  Concentration: Fair and Attention Span: Fair  Recall:  [employer] of Knowledge: Fair  Language: Fair  Akathisia:  No  Handed:  Right  AIMS (if indicated): not done  Assets:  Communication Skills Desire for Improvement Housing Intimacy Social Support  ADL's:  Intact  Cognition: WNL  Sleep:  Poor   Screenings: GAD-7    Flowsheet Row Video Visit from 11/19/2021 in [facility] Video Visit from 10/23/2021 in [facility] Video Visit from 06/19/2021 in [facility] Video Visit from 04/03/2021 in [facility] Video Visit from 03/04/2021 in [facility]  Total GAD-7 Score 14 2 3 6 17       PHQ2-9    Flowsheet Row Video Visit from 11/19/2021 in [facility] Video Visit from 11/12/2021 in [facility] Video Visit from 10/23/2021 in [facility] Video Visit from 06/19/2021 in [facility] Procedure visit from 06/17/2021 in [facility]  PHQ-2 Total Score 5 1 0 1 2  PHQ-9 Total Score 12 -- -- 2 5      Flowsheet Row Video Visit from 11/19/2021 in [facility] to Hosp-Admission (Discharged) from 11/04/2021 in [facility] Video Visit from 10/23/2021 in [facility] No Risk No Risk Moderate Risk        Assessment and Plan: Colleen [name] is a 61 year old Caucasian female, unemployed, married, has a history of anxiety, MDD, PTSD, chronic pain, these recent discharge from inpatient medical unit for chest pain with troponin elevation, currently struggling with anxiety, sleep problems will benefit from the following plan.  Plan MDD-unstable Continue Seroquel 25 mg p.o. nightly. We will consider readjusting the dosage in the future however she will need repeat EKG prior to that. We will consider adding an SNRI/SSRI in the future.  Patient however will need an EKG prior to doing so. Will increase gabapentin to 300 mg p.o. daily in the morning, 300 mg at noon and 600 mg at bedtime. Discussed referral for TMS-will consider this in the future. I have reviewed genetic testing results while making medication changes today.  GAD-unstable Likely exacerbated by her medical problems, pain as well as smoking cessation. Increased gabapentin as noted above. Start Valium 2 mg p.o. daily as needed.  Patient advised to limit use.  Patient reported previously tolerating Valium well. Continue CBT. Will refer for MH IOP-I have sent communication to Ms. Dellia Nims.  Insomnia-unstable Likely due to shortness of breath, anxiety and pain We will continue Seroquel for now. Gabapentin increased as noted above. Patient will need sufficient management of her pain as well as other medical issues including shortness of breath.  Tobacco use disorder-improving Patient reports she quit smoking 2 days ago.  At risk for prolonged QT syndrome-we will order EKG. Patient to call [phone].  I have reviewed notes per cardiology-Dr. Fabio Asa - dated 11/18/2021.  Follow-up in clinic  in 10 days or sooner if needed.  This note was generated in part or whole with voice recognition software. Voice recognition is usually quite accurate but there are transcription errors that can and very often do occur. I apologize for any typographical errors that were not detected and corrected.       Ursula Alert, MD 11/20/2021, 9:48 AM

## 2021-11-19 NOTE — Telephone Encounter (Signed)
D:  Dr. Shea Evans referred pt to [facility], but pt has [employer], which [facility] doesn't accept.  Dr. Shea Evans requesting that the case manager discusses other resources with pt. A:  Placed call to discuss resources [facility] [phone]) with pt, but there was no answer.  Left vm for patient to call cm back.  Inform Dr. Shea Evans.

## 2021-11-25 ENCOUNTER — Ambulatory Visit

## 2021-11-29 ENCOUNTER — Telehealth: Payer: Self-pay | Admitting: Psychiatry

## 2021-11-29 ENCOUNTER — Ambulatory Visit
Admission: RE | Admit: 2021-11-29 | Discharge: 2021-11-29 | Disposition: A | Discharge status: Home / Self Care | Source: Ambulatory Visit | Attending: Psychiatry | Admitting: Psychiatry

## 2021-11-29 ENCOUNTER — Other Ambulatory Visit: Payer: Self-pay

## 2021-11-29 DIAGNOSIS — Z9189 Other specified personal risk factors, not elsewhere classified: Secondary | ICD-10-CM | POA: Diagnosis present

## 2021-11-29 NOTE — Telephone Encounter (Signed)
Reviewed EKG-attempted to contact patient to discuss.

## 2021-12-04 ENCOUNTER — Inpatient Hospital Stay
Admission: EM | Admit: 2021-12-04 | Discharge: 2021-12-10 | DRG: 191 | Disposition: A | Discharge status: Home / Self Care | Attending: Internal Medicine | Admitting: Internal Medicine

## 2021-12-04 ENCOUNTER — Emergency Department

## 2021-12-04 DIAGNOSIS — Z853 Personal history of malignant neoplasm of breast: Secondary | ICD-10-CM | POA: Diagnosis not present

## 2021-12-04 DIAGNOSIS — Z880 Allergy status to penicillin: Secondary | ICD-10-CM

## 2021-12-04 DIAGNOSIS — J90 Pleural effusion, not elsewhere classified: Secondary | ICD-10-CM | POA: Diagnosis not present

## 2021-12-04 DIAGNOSIS — E785 Hyperlipidemia, unspecified: Secondary | ICD-10-CM | POA: Diagnosis not present

## 2021-12-04 DIAGNOSIS — F410 Panic disorder [episodic paroxysmal anxiety] without agoraphobia: Secondary | ICD-10-CM | POA: Diagnosis present

## 2021-12-04 DIAGNOSIS — Z905 Acquired absence of kidney: Secondary | ICD-10-CM | POA: Diagnosis not present

## 2021-12-04 DIAGNOSIS — R21 Rash and other nonspecific skin eruption: Secondary | ICD-10-CM | POA: Diagnosis not present

## 2021-12-04 DIAGNOSIS — Z888 Allergy status to other drugs, medicaments and biological substances status: Secondary | ICD-10-CM | POA: Diagnosis not present

## 2021-12-04 DIAGNOSIS — Z803 Family history of malignant neoplasm of breast: Secondary | ICD-10-CM

## 2021-12-04 DIAGNOSIS — Z885 Allergy status to narcotic agent status: Secondary | ICD-10-CM | POA: Diagnosis not present

## 2021-12-04 DIAGNOSIS — Z923 Personal history of irradiation: Secondary | ICD-10-CM

## 2021-12-04 DIAGNOSIS — Z79899 Other long term (current) drug therapy: Secondary | ICD-10-CM

## 2021-12-04 DIAGNOSIS — J9811 Atelectasis: Secondary | ICD-10-CM | POA: Diagnosis not present

## 2021-12-04 DIAGNOSIS — Z9012 Acquired absence of left breast and nipple: Secondary | ICD-10-CM | POA: Diagnosis not present

## 2021-12-04 DIAGNOSIS — Z882 Allergy status to sulfonamides status: Secondary | ICD-10-CM

## 2021-12-04 DIAGNOSIS — J9611 Chronic respiratory failure with hypoxia: Secondary | ICD-10-CM | POA: Diagnosis present

## 2021-12-04 DIAGNOSIS — R079 Chest pain, unspecified: Secondary | ICD-10-CM

## 2021-12-04 DIAGNOSIS — L539 Erythematous condition, unspecified: Secondary | ICD-10-CM | POA: Diagnosis not present

## 2021-12-04 DIAGNOSIS — J441 Chronic obstructive pulmonary disease with (acute) exacerbation: Secondary | ICD-10-CM | POA: Diagnosis present

## 2021-12-04 DIAGNOSIS — Z9152 Personal history of nonsuicidal self-harm: Secondary | ICD-10-CM

## 2021-12-04 DIAGNOSIS — Z20822 Contact with and (suspected) exposure to covid-19: Secondary | ICD-10-CM | POA: Diagnosis present

## 2021-12-04 DIAGNOSIS — Z87891 Personal history of nicotine dependence: Secondary | ICD-10-CM | POA: Diagnosis not present

## 2021-12-04 DIAGNOSIS — F4323 Adjustment disorder with mixed anxiety and depressed mood: Secondary | ICD-10-CM | POA: Diagnosis not present

## 2021-12-04 DIAGNOSIS — G894 Chronic pain syndrome: Secondary | ICD-10-CM | POA: Diagnosis not present

## 2021-12-04 IMAGING — DX DG CHEST 1V PORT
1 series · 1 of 1 positions shown · non-contrast
Comparison: [DATE]

CLINICAL DATA: Shortness of breath, history of COPD

EXAM:
PORTABLE CHEST 1 VIEW

[chest ap]
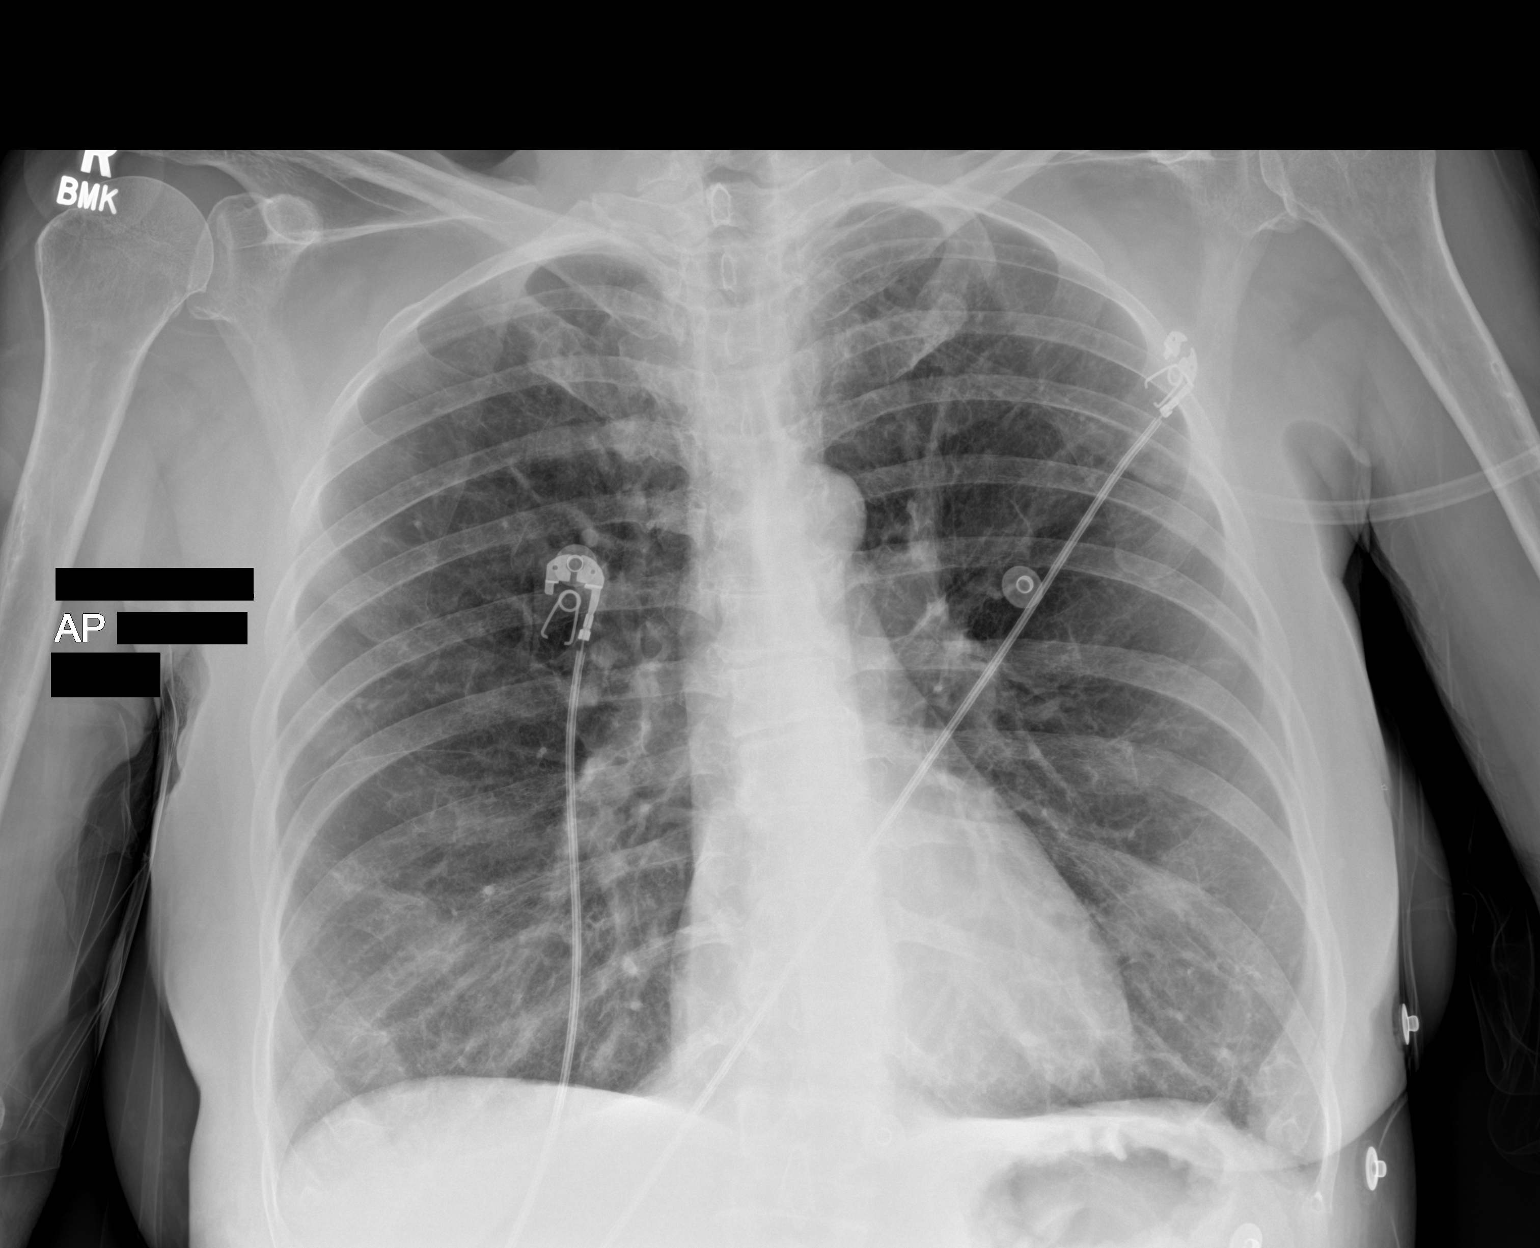

[1 of 1 positions shown; findings below may reference images not displayed]

FINDINGS: Cardiac and mediastinal contours are within normal limits. No new
focal pulmonary opacity. Changes related to COPD, including
emphysema and bronchitic changes. No pleural effusion or
pneumothorax. No acute osseous abnormality.
IMPRESSION: COPD without additional acute cardiopulmonary process.

## 2021-12-04 MED ORDER — METHYLPREDNISOLONE SODIUM SUCC 125 MG IJ SOLR
125.0000 mg | Freq: Once | INTRAMUSCULAR | Status: AC
Start: 2021-12-04 — End: 2021-12-04
  Administered 2021-12-04: 23:00:00 125 mg via INTRAVENOUS
  Filled 2021-12-04: qty 2

## 2021-12-04 MED ORDER — IPRATROPIUM-ALBUTEROL 0.5-2.5 (3) MG/3ML IN SOLN
6.0000 mL | Freq: Once | RESPIRATORY_TRACT | Status: AC
  Administered 2021-12-04: 23:00:00 6 mL via RESPIRATORY_TRACT
  Filled 2021-12-04: qty 3

## 2021-12-04 MED ORDER — KETAMINE HCL 50 MG/5ML IJ SOSY
0.3000 mg/kg | PREFILLED_SYRINGE | Freq: Once | INTRAMUSCULAR | Status: AC
  Administered 2021-12-04: 18 mg via INTRAVENOUS
  Filled 2021-12-04: qty 5

## 2021-12-04 MED ORDER — IPRATROPIUM-ALBUTEROL 0.5-2.5 (3) MG/3ML IN SOLN
RESPIRATORY_TRACT | Status: AC
  Filled 2021-12-04: qty 3

## 2021-12-04 MED ORDER — KETOROLAC TROMETHAMINE 30 MG/ML IJ SOLN
30.0000 mg | Freq: Once | INTRAMUSCULAR | Status: AC
  Administered 2021-12-04: 23:00:00 30 mg via INTRAVENOUS
  Filled 2021-12-04: qty 1

## 2021-12-04 NOTE — ED Triage Notes (Addendum)
Pt here from home per EMS for c/o  difficulty breathing.  PMHX COPD on home O2 at 2lpm/[place] .  Per EMS initially noted tight airways , duoneb given en route. Pt presents to ED AAOx4, breathing even-unlabored at this time. ST per cardiac monitor. Denies fever. Reports back pain progressively gotten worse

## 2021-12-04 NOTE — ED Notes (Signed)
ED Provider at bedside. 

## 2021-12-04 NOTE — ED Provider Notes (Signed)
[facility] Provider Note    Event Date/Time   First MD Initiated Contact with Patient 12/04/21 2136     (approximate)   History   Shortness of Breath and Back Pain   HPI Colleen [name] is a 61 y.o. female with a stated past medical history of COPD and chronic back pain who presents for shortness of breath and worsening back pain.  Patient states that she has had worsening shortness of breath since being discharged from the hospital 2 weeks prior to arrival and believes that she was discharged too soon.  Patient states that she is on 2-4 L of submental oxygen at home and states that she was discharged on similar oxygen requirements 2 weeks prior to arrival but did not receive any steroid or antibiotic treatment for what she believed was a COPD exacerbation.  Patient also endorses dyspnea on exertion that has been worsening over this last 2 weeks.  Patient denies any fevers, abdominal pain, nausea/vomiting/diarrhea patient describes worsening of her chronic back pain that is 10/10, nonradiating, and constant     Physical Exam   Triage Vital Signs: ED Triage Vitals  Enc Vitals Group     BP 12/04/21 2138 (!) 130/98     Pulse Rate 12/04/21 2135 (!) 107     Resp 12/04/21 2135 (!) 22     Temp 12/04/21 2135 98.3 F (36.8 C)     Temp src --      SpO2 12/04/21 2138 97 %     Weight 12/04/21 2136 132 lb 4.4 oz (60 kg)     Height 12/04/21 2136 5' (1.524 m)     Head Circumference --      Peak Flow --      Pain Score 12/04/21 2221 10     Pain Loc --      Pain Edu? --      Excl. in [place]? --     Most recent vital signs: Vitals:   12/04/21 2344 12/04/21 2347  BP: (!) 132/92 (!) 142/95  Pulse: (!) 104 100  Resp: (!) 27 (!) 28  Temp:    SpO2: 92% 95%    General: Awake, oriented x4 CV:  Good peripheral perfusion.  Resp:  Increased effort.  Mild end expiratory wheezes with poor air movement Abd:  No distention.  Other:  Elderly Caucasian female standing next  to the bed tachypneic with submental oxygen in place   ED Results / Procedures / Treatments   Labs (all labs ordered are listed, but only abnormal results are displayed) Labs Reviewed  RESP PANEL BY RT-PCR (FLU A&B, COVID) [id number]  COMPREHENSIVE METABOLIC PANEL  BRAIN NATRIURETIC PEPTIDE  CBC WITH DIFFERENTIAL/PLATELET  TROPONIN I (HIGH SENSITIVITY)     EKG ED ECG REPORT I, [name], the attending physician, personally viewed and interpreted this ECG.  Date: 12/04/2021 EKG Time: 2141 Rate: 102 Rhythm: Tachycardic sinus rhythm QRS Axis: normal Intervals: normal ST/T Wave abnormalities: normal Narrative Interpretation: Tachycardic sinus rhythm no evidence of acute ischemia   RADIOLOGY ED MD interpretation: Pending at signout  Official radiology report(s): No results found.    PROCEDURES:  Critical Care performed: No  Procedures   MEDICATIONS ORDERED IN ED: Medications  ipratropium-albuterol (DUONEB) 0.5-2.5 (3) MG/3ML nebulizer solution 6 mL (6 mLs Nebulization Given 12/04/21 2244)  methylPREDNISolone sodium succinate (SOLU-MEDROL) 125 mg/2 mL injection 125 mg (125 mg Intravenous Given 12/04/21 2245)  ketorolac (TORADOL) 30 MG/ML injection 30 mg (30 mg Intravenous Given 12/04/21  2245)  ketamine 50 mg in normal saline 5 mL (10 mg/mL) syringe (18 mg Intravenous Given 12/04/21 2337)     IMPRESSION / MDM / ASSESSMENT AND PLAN / ED COURSE  I reviewed the triage vital signs and the nursing notes.                              Differential diagnosis includes, but is not limited to, COPD exacerbation, pulmonary embolism, ACS, spinal epidural abscess, vertebral fracture The patient is on the cardiac monitor to evaluate for evidence of arrhythmia and/or significant heart rate changes.  The patient appears to be suffering from a moderate/severe exacerbation of COPD.  Based on the history, exam, CXR/EKG reviewed by me, and further workup I dont suspect any other  emergent cause of this presentation, such as pneumonia, acute coronary syndrome, congestive heart failure, pulmonary embolism, or pneumothorax.  ED Interventions: bronchodilators, steroids, antibiotics, reassess  Reassessment: After treatment, the patients shortness of breath is improving but patient is still requiring increased supplemental oxygenation   Disposition: Care of this patient will be signed out to the oncoming physician at the end of my shift.  All pertinent patient information conveyed and all questions answered.  All further care and disposition decisions will be made by the oncoming physician.       FINAL CLINICAL IMPRESSION(S) / ED DIAGNOSES   Final diagnoses:  COPD exacerbation ([place])     Rx / DC Orders   ED Discharge Orders     None        Note:  This document was prepared using Dragon voice recognition software and may include unintentional dictation errors.   [name], MD 12/04/21 [phone]

## 2021-12-05 ENCOUNTER — Telehealth: Admitting: Psychiatry

## 2021-12-05 ENCOUNTER — Other Ambulatory Visit: Payer: Self-pay

## 2021-12-05 ENCOUNTER — Encounter: Payer: Self-pay | Admitting: Family Medicine

## 2021-12-05 DIAGNOSIS — J441 Chronic obstructive pulmonary disease with (acute) exacerbation: Secondary | ICD-10-CM | POA: Diagnosis present

## 2021-12-05 DIAGNOSIS — Z923 Personal history of irradiation: Secondary | ICD-10-CM | POA: Diagnosis not present

## 2021-12-05 DIAGNOSIS — Z885 Allergy status to narcotic agent status: Secondary | ICD-10-CM | POA: Diagnosis not present

## 2021-12-05 DIAGNOSIS — M47818 Spondylosis without myelopathy or radiculopathy, sacral and sacrococcygeal region: Secondary | ICD-10-CM | POA: Diagnosis not present

## 2021-12-05 DIAGNOSIS — F32A Depression, unspecified: Secondary | ICD-10-CM

## 2021-12-05 DIAGNOSIS — J90 Pleural effusion, not elsewhere classified: Secondary | ICD-10-CM | POA: Diagnosis present

## 2021-12-05 DIAGNOSIS — Z853 Personal history of malignant neoplasm of breast: Secondary | ICD-10-CM | POA: Diagnosis not present

## 2021-12-05 DIAGNOSIS — Z882 Allergy status to sulfonamides status: Secondary | ICD-10-CM | POA: Diagnosis not present

## 2021-12-05 DIAGNOSIS — F419 Anxiety disorder, unspecified: Secondary | ICD-10-CM

## 2021-12-05 DIAGNOSIS — F410 Panic disorder [episodic paroxysmal anxiety] without agoraphobia: Secondary | ICD-10-CM | POA: Diagnosis present

## 2021-12-05 DIAGNOSIS — E785 Hyperlipidemia, unspecified: Secondary | ICD-10-CM

## 2021-12-05 DIAGNOSIS — Z9152 Personal history of nonsuicidal self-harm: Secondary | ICD-10-CM | POA: Diagnosis not present

## 2021-12-05 DIAGNOSIS — L539 Erythematous condition, unspecified: Secondary | ICD-10-CM | POA: Diagnosis present

## 2021-12-05 DIAGNOSIS — J9611 Chronic respiratory failure with hypoxia: Secondary | ICD-10-CM | POA: Diagnosis present

## 2021-12-05 DIAGNOSIS — Z87891 Personal history of nicotine dependence: Secondary | ICD-10-CM | POA: Diagnosis not present

## 2021-12-05 DIAGNOSIS — R21 Rash and other nonspecific skin eruption: Secondary | ICD-10-CM | POA: Diagnosis present

## 2021-12-05 DIAGNOSIS — G894 Chronic pain syndrome: Secondary | ICD-10-CM | POA: Diagnosis present

## 2021-12-05 DIAGNOSIS — Z79899 Other long term (current) drug therapy: Secondary | ICD-10-CM | POA: Diagnosis not present

## 2021-12-05 DIAGNOSIS — Z9012 Acquired absence of left breast and nipple: Secondary | ICD-10-CM | POA: Diagnosis not present

## 2021-12-05 DIAGNOSIS — Z888 Allergy status to other drugs, medicaments and biological substances status: Secondary | ICD-10-CM | POA: Diagnosis not present

## 2021-12-05 DIAGNOSIS — J9811 Atelectasis: Secondary | ICD-10-CM | POA: Diagnosis present

## 2021-12-05 DIAGNOSIS — Z803 Family history of malignant neoplasm of breast: Secondary | ICD-10-CM | POA: Diagnosis not present

## 2021-12-05 DIAGNOSIS — Z20822 Contact with and (suspected) exposure to covid-19: Secondary | ICD-10-CM | POA: Diagnosis present

## 2021-12-05 DIAGNOSIS — Z880 Allergy status to penicillin: Secondary | ICD-10-CM | POA: Diagnosis not present

## 2021-12-05 DIAGNOSIS — Z905 Acquired absence of kidney: Secondary | ICD-10-CM | POA: Diagnosis not present

## 2021-12-05 DIAGNOSIS — F4323 Adjustment disorder with mixed anxiety and depressed mood: Secondary | ICD-10-CM | POA: Diagnosis present

## 2021-12-05 LAB — CBC WITH DIFFERENTIAL/PLATELET
Abs Immature Granulocytes: 0.04 10*3/uL (ref 0.00–0.07)
Basophils Absolute: 0.1 10*3/uL (ref 0.0–0.1)
Basophils Relative: 1 %
Eosinophils Absolute: 0.3 10*3/uL (ref 0.0–0.5)
Eosinophils Relative: 5 %
HCT: 44.6 % (ref 36.0–46.0)
Hemoglobin: 14.8 g/dL (ref 12.0–15.0)
Immature Granulocytes: 1 %
Lymphocytes Relative: 9 %
Lymphs Abs: 0.7 10*3/uL (ref 0.7–4.0)
MCH: 29.7 pg (ref 26.0–34.0)
MCHC: 33.2 g/dL (ref 30.0–36.0)
MCV: 89.6 fL (ref 80.0–100.0)
Monocytes Absolute: 0.5 10*3/uL (ref 0.1–1.0)
Monocytes Relative: 6 %
Neutro Abs: 5.8 10*3/uL (ref 1.7–7.7)
Neutrophils Relative %: 78 %
Platelets: 322 10*3/uL (ref 150–400)
RBC: 4.98 MIL/uL (ref 3.87–5.11)
RDW: 14 % (ref 11.5–15.5)
WBC: 7.3 10*3/uL (ref 4.0–10.5)
nRBC: 0 % (ref 0.0–0.2)

## 2021-12-05 LAB — BASIC METABOLIC PANEL
Anion gap: 7 (ref 5–15)
BUN: 6 mg/dL — ABNORMAL LOW (ref 8–23)
CO2: 26 mmol/L (ref 22–32)
Calcium: 8.2 mg/dL — ABNORMAL LOW (ref 8.9–10.3)
Chloride: 97 mmol/L — ABNORMAL LOW (ref 98–111)
Creatinine, Ser: 0.49 mg/dL (ref 0.44–1.00)
GFR, Estimated: >60 mL/min (ref >60)
Glucose, Bld: 141 mg/dL — ABNORMAL HIGH (ref 70–99)
Potassium: 5 mmol/L (ref 3.5–5.1)
Sodium: 130 mmol/L — ABNORMAL LOW (ref 135–145)

## 2021-12-05 LAB — CBC
HCT: 41.5 % (ref 36.0–46.0)
Hemoglobin: 14.1 g/dL (ref 12.0–15.0)
MCH: 30.2 pg (ref 26.0–34.0)
MCHC: 34 g/dL (ref 30.0–36.0)
MCV: 88.9 fL (ref 80.0–100.0)
Platelets: 315 10*3/uL (ref 150–400)
RBC: 4.67 MIL/uL (ref 3.87–5.11)
RDW: 14.1 % (ref 11.5–15.5)
WBC: 5.8 10*3/uL (ref 4.0–10.5)
nRBC: 0 % (ref 0.0–0.2)

## 2021-12-05 LAB — RESP PANEL BY RT-PCR (FLU A&B, COVID) ARPGX2
Influenza A by PCR: NEGATIVE
Influenza B by PCR: NEGATIVE
SARS Coronavirus 2 by RT PCR: NEGATIVE

## 2021-12-05 LAB — COMPREHENSIVE METABOLIC PANEL
ALT: 12 U/L (ref 0–44)
AST: 15 U/L (ref 15–41)
Albumin: 4.2 g/dL (ref 3.5–5.0)
Alkaline Phosphatase: 44 U/L (ref 38–126)
Anion gap: 10 (ref 5–15)
BUN: 6 mg/dL — ABNORMAL LOW (ref 8–23)
CO2: 27 mmol/L (ref 22–32)
Calcium: 9 mg/dL (ref 8.9–10.3)
Chloride: 92 mmol/L — ABNORMAL LOW (ref 98–111)
Creatinine, Ser: 0.47 mg/dL (ref 0.44–1.00)
GFR, Estimated: >60 mL/min (ref >60)
Glucose, Bld: 99 mg/dL (ref 70–99)
Potassium: 3.8 mmol/L (ref 3.5–5.1)
Sodium: 129 mmol/L — ABNORMAL LOW (ref 135–145)
Total Bilirubin: 0.6 mg/dL (ref 0.3–1.2)
Total Protein: 7.5 g/dL (ref 6.5–8.1)

## 2021-12-05 LAB — TROPONIN I (HIGH SENSITIVITY)
Troponin I (High Sensitivity): 4 ng/L (ref <18)
Troponin I (High Sensitivity): 6 ng/L (ref <18)

## 2021-12-05 LAB — BRAIN NATRIURETIC PEPTIDE: B Natriuretic Peptide: 22.2 pg/mL (ref 0.0–100.0)

## 2021-12-05 MED ORDER — GABAPENTIN 300 MG PO CAPS
300.0000 mg | ORAL_CAPSULE | Freq: Two times a day (BID) | ORAL | Status: DC
  Administered 2021-12-05 – 2021-12-06 (×3): 300 mg via ORAL
  Filled 2021-12-05 (×3): qty 1

## 2021-12-05 MED ORDER — ACETAMINOPHEN 650 MG RE SUPP: 650.0000 mg | Freq: Four times a day (QID) | RECTAL | Status: DC | PRN

## 2021-12-05 MED ORDER — KETOROLAC TROMETHAMINE 30 MG/ML IJ SOLN
30.0000 mg | Freq: Four times a day (QID) | INTRAMUSCULAR | Status: DC | PRN
  Administered 2021-12-05 – 2021-12-07 (×3): 30 mg via INTRAVENOUS
  Filled 2021-12-05 (×3): qty 1

## 2021-12-05 MED ORDER — AZITHROMYCIN 500 MG PO TABS
500.0000 mg | ORAL_TABLET | Freq: Every day | ORAL | Status: DC
  Administered 2021-12-06: 05:00:00 500 mg via ORAL
  Filled 2021-12-05: qty 1

## 2021-12-05 MED ORDER — ENOXAPARIN SODIUM 40 MG/0.4ML IJ SOSY
40.0000 mg | PREFILLED_SYRINGE | INTRAMUSCULAR | Status: DC
  Administered 2021-12-07 – 2021-12-10 (×4): 40 mg via SUBCUTANEOUS
  Filled 2021-12-05 (×5): qty 0.4

## 2021-12-05 MED ORDER — SODIUM CHLORIDE 0.9 % IV SOLN
500.0000 mg | INTRAVENOUS | Status: AC
  Administered 2021-12-05: 500 mg via INTRAVENOUS
  Filled 2021-12-05: qty 5

## 2021-12-05 MED ORDER — BENZONATATE 100 MG PO CAPS
100.0000 mg | ORAL_CAPSULE | Freq: Three times a day (TID) | ORAL | Status: DC | PRN
  Administered 2021-12-05 – 2021-12-09 (×6): 100 mg via ORAL
  Filled 2021-12-05 (×7): qty 1

## 2021-12-05 MED ORDER — TRAZODONE HCL 50 MG PO TABS: 25.0000 mg | ORAL_TABLET | Freq: Every evening | ORAL | Status: DC | PRN

## 2021-12-05 MED ORDER — ONDANSETRON HCL 4 MG/2ML IJ SOLN
4.0000 mg | Freq: Four times a day (QID) | INTRAMUSCULAR | Status: DC | PRN
  Administered 2021-12-10: 4 mg via INTRAVENOUS
  Filled 2021-12-05: qty 2

## 2021-12-05 MED ORDER — SODIUM CHLORIDE 0.9 % IV SOLN: INTRAVENOUS | Status: DC

## 2021-12-05 MED ORDER — ONDANSETRON HCL 4 MG PO TABS: 4.0000 mg | ORAL_TABLET | Freq: Four times a day (QID) | ORAL | Status: DC | PRN

## 2021-12-05 MED ORDER — GABAPENTIN 300 MG PO CAPS: 300.0000 mg | ORAL_CAPSULE | ORAL | Status: DC

## 2021-12-05 MED ORDER — LIDOCAINE 5 % EX PTCH
1.0000 | MEDICATED_PATCH | CUTANEOUS | Status: DC
  Administered 2021-12-05 – 2021-12-10 (×5): 1 via TRANSDERMAL
  Filled 2021-12-05 (×6): qty 1

## 2021-12-05 MED ORDER — ACETAMINOPHEN 325 MG PO TABS
650.0000 mg | ORAL_TABLET | Freq: Four times a day (QID) | ORAL | Status: DC | PRN
  Administered 2021-12-05 – 2021-12-09 (×5): 650 mg via ORAL
  Filled 2021-12-05 (×5): qty 2

## 2021-12-05 MED ORDER — IPRATROPIUM-ALBUTEROL 0.5-2.5 (3) MG/3ML IN SOLN
3.0000 mL | Freq: Four times a day (QID) | RESPIRATORY_TRACT | Status: DC
  Administered 2021-12-05 (×3): 3 mL via RESPIRATORY_TRACT
  Filled 2021-12-05 (×3): qty 3

## 2021-12-05 MED ORDER — ATORVASTATIN CALCIUM 20 MG PO TABS
40.0000 mg | ORAL_TABLET | Freq: Every day | ORAL | Status: DC
  Filled 2021-12-05 (×5): qty 2

## 2021-12-05 MED ORDER — METHOCARBAMOL 1000 MG/10ML IJ SOLN
500.0000 mg | Freq: Four times a day (QID) | INTRAVENOUS | Status: DC | PRN
  Administered 2021-12-06 – 2021-12-07 (×3): 500 mg via INTRAVENOUS
  Filled 2021-12-05: qty 500
  Filled 2021-12-05 (×4): qty 5

## 2021-12-05 MED ORDER — MAGNESIUM HYDROXIDE 400 MG/5ML PO SUSP
30.0000 mL | Freq: Every day | ORAL | Status: DC | PRN
  Filled 2021-12-05: qty 30

## 2021-12-05 MED ORDER — FLUTICASONE PROPIONATE 50 MCG/ACT NA SUSP
2.0000 | Freq: Every day | NASAL | Status: DC
  Administered 2021-12-06 – 2021-12-10 (×4): 2 via NASAL
  Filled 2021-12-05: qty 16

## 2021-12-05 MED ORDER — LIDOCAINE 5 % EX PTCH
1.0000 | MEDICATED_PATCH | CUTANEOUS | Status: DC
Start: 2021-12-05 — End: 2021-12-05

## 2021-12-05 MED ORDER — QUETIAPINE FUMARATE 25 MG PO TABS
25.0000 mg | ORAL_TABLET | Freq: Every day | ORAL | Status: DC
  Administered 2021-12-05 (×2): 25 mg via ORAL
  Filled 2021-12-05 (×2): qty 1

## 2021-12-05 MED ORDER — KETOROLAC TROMETHAMINE 30 MG/ML IJ SOLN
15.0000 mg | Freq: Four times a day (QID) | INTRAMUSCULAR | Status: DC | PRN
  Administered 2021-12-05: 15 mg via INTRAVENOUS
  Filled 2021-12-05: qty 1

## 2021-12-05 MED ORDER — EPINEPHRINE 0.3 MG/0.3ML IJ SOAJ
0.3000 mg | INTRAMUSCULAR | Status: DC | PRN
  Filled 2021-12-05 (×2): qty 0.3

## 2021-12-05 MED ORDER — GUAIFENESIN-DM 100-10 MG/5ML PO SYRP
5.0000 mL | ORAL_SOLUTION | ORAL | Status: DC | PRN
  Administered 2021-12-05 – 2021-12-07 (×3): 5 mL via ORAL
  Filled 2021-12-05 (×3): qty 5

## 2021-12-05 MED ORDER — GABAPENTIN 300 MG PO CAPS
600.0000 mg | ORAL_CAPSULE | Freq: Every day | ORAL | Status: DC
  Administered 2021-12-05: 600 mg via ORAL
  Filled 2021-12-05: qty 2

## 2021-12-05 MED ORDER — FENTANYL CITRATE PF 50 MCG/ML IJ SOSY
25.0000 ug | PREFILLED_SYRINGE | INTRAMUSCULAR | Status: DC | PRN
  Administered 2021-12-05 – 2021-12-10 (×21): 25 ug via INTRAVENOUS
  Filled 2021-12-05 (×21): qty 1

## 2021-12-05 MED ORDER — METHYLPREDNISOLONE SODIUM SUCC 40 MG IJ SOLR
40.0000 mg | Freq: Two times a day (BID) | INTRAMUSCULAR | Status: AC
  Administered 2021-12-05 (×2): 40 mg via INTRAVENOUS
  Filled 2021-12-05 (×2): qty 1

## 2021-12-05 MED ORDER — PREDNISONE 20 MG PO TABS
40.0000 mg | ORAL_TABLET | Freq: Every day | ORAL | Status: AC
  Administered 2021-12-06 – 2021-12-09 (×4): 40 mg via ORAL
  Filled 2021-12-05 (×4): qty 2

## 2021-12-05 MED ORDER — DIAZEPAM 2 MG PO TABS
2.0000 mg | ORAL_TABLET | Freq: Every day | ORAL | Status: DC | PRN
  Administered 2021-12-05: 2 mg via ORAL
  Filled 2021-12-05 (×3): qty 1

## 2021-12-05 NOTE — ED Provider Notes (Signed)
Vitals:   12/05/21 0010 12/05/21 0020  BP: 125/86 (!) 124/106  Pulse: 95 95  Resp: (!) 21 20  Temp:    SpO2: 94% 96%     Patient is resting at this time.  Currently requiring 3-1/2 L oxygen by nasal cannula maintain saturation 92% range while resting  Lung sounds showed chest slight wheezing, but overall she reports she feels much better believes that the last respiratory treatment was quite helpful.  Her labs this point reviewed by me interpreted as mild hyponatremia, CBC with normal white count normal hemoglobin.  Her pain is much better and more well controlled after receiving ketamine.  She reports to me a very chronic low back pain that she is tried multiple treatments for follows with the pain clinic and denies that its exacerbated over its baseline  Given her underlying COPD and her presentation with significant shortness of breath and increased oxygen requirement, we will consult the hospitalist for further decision and admission  Discussed with the hospitalist Dr. Gay Filler, MD 12/05/21 [phone]

## 2021-12-05 NOTE — ED Notes (Signed)
Below is the initial response from patient upon assessment after she made the comment "I would rather be dead than have this pain." Colleen [name], ED Charge RN entered room with Probation officer to clarify. Patient states she is not having any intentions or active suicidal thoughts, she just "wonders if being dead would hurt as much as this pain." No active suicide precautions at this time. Will provide patient with pain medication as ordered.    12/05/21 0854  [place] Suicide Severity Rating Scale  1. Wish to be Dead Yes  2. Suicidal Thoughts Yes  3. Suicidal Thoughts with Method Without Specific Plan or Intent to Act No  4. Suicidal Intent Without Specific Plan No  5. Suicide Intent with Specific Plan No  6. Suicide Behavior Question Yes  7. How long ago did you do any of these? Between three months and a year ago  [place] Moderate Risk  Patient location: Emergency Department

## 2021-12-05 NOTE — ED Notes (Signed)
Informed RN bed assigned 

## 2021-12-05 NOTE — Progress Notes (Signed)
Patient c/o coccyx/hip pain not controlled by previously administered fentanyl, toradol, or tylenol. Patient is tachypnic and states "this pain is so bad I want to commit suicide. Can you call my husband to cancel my appointment with my shrink today?" Dr. Lupita Leash and Charge RN [name] notified by secure chat. RN remains at bedside. Patient refusing breathing treatment at this time.

## 2021-12-05 NOTE — ED Notes (Signed)
This RN went to speak with pt. Pt had voiced SI concerns to [employer]. This RN asked pt if she was having thoughts of killing herself. Pt denies any thoughts. Pt states earlier she said the pain is so bad I could die. Pt adamantly denies multiple times SI thoughts or any plan.

## 2021-12-05 NOTE — ED Notes (Signed)
Notified Dr.Mansy of patients progressing pain. Dr.Mansy went to the bedside to evaluate, new orders placed.

## 2021-12-05 NOTE — Progress Notes (Signed)
Attempted to call husband Herbie [place] per patient request. HIPAA compliant voicemail left with callback number; patient informed.

## 2021-12-05 NOTE — Progress Notes (Signed)
Patient seen and examined personally, I reviewed the chart, history and physical and admission note, done by admitting physician this morning and agree with the same with following addendum.  Please refer to the morning admission note for more detailed plan of care.  Briefly,   61 year old female with asthma COPD chronic hypoxic aspiratory failure ongoing tobacco abuse, anxiety/depression chronic back pain history of osteoporosis/osteopenia presenting with dyspnea cough chest pain wheezing. She was seen in the ED was tachycardic tachypneic and wheezing COVID-19 negative chest x-ray COPD changes, patient is doing IV Toradol Solu-Medrol bronchodilators along with IV ketamine due to her back pain and patient was admitted for COPD exacerbation   On my exam she was resting comfortably she endorses chronic pain followed by Dr. Holley Raring from pain clinic for which she gets injection in the back and due for 1 soon  Lungs are much clear resting comfortably  We will continue systemic stress bronchodilators for COPD exacerbation. Chest pain she had negative cardiac cath in December.  Currently chest pain resolved Chronic back pain/left buttock pain, SI joint arthritis pain: Followed by pain management clinic.  We will continue lidocaine patch resume home meds along with IV Toradol IV opiates for pain control she will need ongoing follow-up with her pain specialist and discuss further plan HLD continue her statin  denied SI.

## 2021-12-05 NOTE — H&P (Signed)
[facility]   PATIENT NAME: Colleen [name]    MR#:  [id number]  DATE OF BIRTH:  12-10-1960  DATE OF ADMISSION:  12/04/2021  PRIMARY CARE PHYSICIAN: Remi Haggard, FNP   Patient is coming from: Home  REQUESTING/REFERRING PHYSICIAN: Delman Kitten, MD  CHIEF COMPLAINT:   Chief Complaint  Patient presents with   Shortness of Breath   Back Pain    HISTORY OF PRESENT ILLNESS:  Colleen [name] is a 61 y.o. Caucasian female with medical history significant for asthma, COPD, depression and anxiety and chronic back pain, who presented to the ER with a [place] of worsening dyspnea with associated cough productive of clear sputum as well as wheezing over the last couple of days.  She denies any fever or chills.  No chest pain or palpitations.  No nausea or vomiting or abdominal pain.  She was having severe right buttock pain during my interview was making her stand.  No paresthesias or radiculopathy.  No urinary or stool incontinence.  ED Course: She came to the ER blood pressure was 130/98 with a heart rate of 103 and respiratory to 28 and pulse oximetry 97% on 2 L of O2 by nasal cannula.  Labs revealed hyponatremia and hypochloremia with otherwise unremarkable CMP.  CBC was within normal.  Influenza antigens and COVID-19 PCR came back negative. EKG as reviewed by me : EKG showed sinus tachycardia with rate 102 with a PAC, biatrial enlargement and borderline right axis deviation. Imaging: Chest x-ray showed COPD changes with no acute cardiopulmonary disease.  The patient was given DuoNeb, IV Solu-Medrol, IV Toradol and IV ketamine.  She will be admitted to a medical-surgical bed for further evaluation and management. PAST MEDICAL HISTORY:   Past Medical History:  Diagnosis Date   Anxiety    Asthma    Breast cancer ([place]) 2004   left breast   Chronic back pain    COPD (chronic obstructive pulmonary disease) ([place])    Depression    Personal history of radiation therapy      PAST SURGICAL HISTORY:   Past Surgical History:  Procedure Laterality Date   BREAST BIOPSY Left 2004   positive   BREAST BIOPSY Right    neg   BREAST LUMPECTOMY Left 2004   positive   LEFT HEART CATH AND CORONARY ANGIOGRAPHY N/A 11/06/2021   Procedure: LEFT HEART CATH AND CORONARY ANGIOGRAPHY;  Surgeon: Corey Skains, MD;  Location: [facility];  Service: Cardiovascular;  Laterality: N/A;   MASTECTOMY Left    MASTECTOMY     NEPHRECTOMY Left    TUBAL LIGATION      SOCIAL HISTORY:   Social History   Tobacco Use   Smoking status: Former    Packs/day: 0.50    Types: Cigarettes   Smokeless tobacco: Never   Tobacco comments:    Reports quit smoking 2 days ago- 11/19/2021  Substance Use Topics   Alcohol use: Yes    FAMILY HISTORY:   Family History  Problem Relation Age of Onset   Breast cancer Cousin    Breast cancer Cousin    Bipolar disorder Daughter    Drug abuse Daughter    Alcohol abuse Maternal Grandfather     DRUG ALLERGIES:   Allergies  Allergen Reactions   Desvenlafaxine Anaphylaxis   Morphine And Related Anaphylaxis   Penicillins Anaphylaxis    Has patient had a PCN reaction causing immediate rash, facial/tongue/throat swelling, SOB or lightheadedness with hypotension: Yes Has  patient had a PCN reaction causing severe rash involving mucus membranes or skin necrosis: No Has patient had a PCN reaction that required hospitalization: Unknown Has patient had a PCN reaction occurring within the last 10 years: No If all of the above answers are "NO", then may proceed with Cephalosporin use.    Prazosin Other (See Comments)   Tamoxifen Anaphylaxis   Trazodone Anaphylaxis   Clonazepam    Codeine Swelling   Duloxetine Itching   Hydroxyzine Itching   Lorazepam     Patient feels this medications makes her to sedated and wants to avoid   Paroxetine Hcl Hives   Sulfa Antibiotics Itching   Cephalexin Rash     REVIEW OF SYSTEMS:   ROS As per history of present illness. All pertinent systems were reviewed above. Constitutional, HEENT, cardiovascular, respiratory, GI, GU, musculoskeletal, neuro, psychiatric, endocrine, integumentary and hematologic systems were reviewed and are otherwise negative/unremarkable except for positive findings mentioned above in the HPI.   MEDICATIONS AT HOME:   Prior to Admission medications   Medication Sig Start Date End Date Taking? Authorizing Provider  acetaminophen (TYLENOL) 500 MG tablet Take 500 mg by mouth every 6 (six) hours as needed for headache, fever, moderate pain or mild pain.    [provider]  acetaminophen (TYLENOL) 500 MG tablet Take by mouth.    [provider]  atorvastatin (LIPITOR) 40 MG tablet Take 1 tablet (40 mg total) by mouth daily. Patient not taking: Reported on 11/19/2021 11/07/21 12/07/21  Donne Hazel, MD  Cholecalciferol 25 MCG (1000 UT) tablet Take by mouth.    [provider]  Cholecalciferol 25 MCG (1000 UT) tablet Take by mouth.    [provider]  denosumab (PROLIA) 60 MG/ML SOSY injection Inject 60 mg into the skin every 6 (six) months.    [provider]  diazepam (VALIUM) 2 MG tablet Take 1 tablet (2 mg total) by mouth daily as needed for anxiety. For severe panic symptoms 11/19/21   Ursula Alert, MD  EPINEPHrine 0.3 mg/0.3 mL IJ SOAJ injection Inject 0.3 mg into the muscle as needed.    [provider]  fluticasone (FLONASE) 50 MCG/ACT nasal spray Place 2 sprays into both nostrils daily. 10/23/21 11/22/21  Pokhrel, Corrie Mckusick, MD  Fluticasone-Umeclidin-Vilant (TRELEGY ELLIPTA) 100-62.5-25 MCG/INH AEPB Inhale 1 puff into the lungs daily.    [provider]  gabapentin (NEURONTIN) 300 MG capsule Take 1-2 capsules (300-600 mg total) by mouth as directed. Take 1 capsule daily AM , 1 capsule at noon and 2 capsules daily at bedtime 11/19/21   Ursula Alert, MD   ipratropium (ATROVENT) 0.02 % nebulizer solution Take by nebulization 4 (four) times daily. 03/26/21   [provider]  ipratropium (ATROVENT) 0.02 % nebulizer solution Inhale into the lungs. 03/26/21   [provider]  lidocaine (LIDODERM) 5 % Place 1 patch onto the skin daily. Remove & Discard patch within 12 hours or as directed by MD 10/22/21   Pokhrel, Corrie Mckusick, MD  lidocaine (LIDODERM) 5 % Place onto the skin. 10/22/21   [provider]  Menthol (ICY HOT) 5 % PTCH Apply 1 patch topically daily as needed.    [provider]  QUEtiapine (SEROQUEL) 50 MG tablet Take 0.5 tablets (25 mg total) by mouth at bedtime. 11/19/21 12/19/21  Ursula Alert, MD  VENTOLIN HFA 108 (90 Base) MCG/ACT inhaler Inhale 1-2 puffs into the lungs every 4 (four) hours as needed for cough or wheezing. 10/02/18   [provider]      VITAL SIGNS:  Blood pressure 135/81, pulse 90, temperature 98.3 F (36.8 C), resp. rate 18, height 5' (1.524 m), weight 60 kg, SpO2 95 %.  PHYSICAL EXAMINATION:  Physical Exam  GENERAL:  61 y.o.-year-old patient lying in the bed with significant distress from left buttock pain.  EYES: Pupils equal, round, reactive to light and accommodation. No scleral icterus. Extraocular muscles intact.  HEENT: Head atraumatic, normocephalic. Oropharynx and nasopharynx clear.  NECK:  Supple, no jugular venous distention. No thyroid enlargement, no tenderness.  LUNGS: Normal breath sounds bilaterally, no wheezing, rales,rhonchi or crepitation. No use of accessory muscles of respiration.  CARDIOVASCULAR: Regular rate and rhythm, S1, S2 normal. No murmurs, rubs, or gallops.  ABDOMEN: Soft, nondistended, nontender. Bowel sounds present. No organomegaly or mass.  EXTREMITIES: No pedal edema, cyanosis, or clubbing. Musculoskeletal: Tender over the left SI joint NEUROLOGIC: Cranial nerves II through XII are intact. Muscle strength 5/5 in all extremities. Sensation  intact. Gait not checked.  PSYCHIATRIC: The patient is alert and oriented x 3.  Normal affect and good eye contact. SKIN: No obvious rash, lesion, or ulcer.   LABORATORY PANEL:   CBC Recent Labs  Lab 12/04/21 2336  WBC 7.3  HGB 14.8  HCT 44.6  PLT 322   ------------------------------------------------------------------------------------------------------------------  Chemistries  Recent Labs  Lab 12/04/21 2336  NA 129*  K 3.8  CL 92*  CO2 27  GLUCOSE 99  BUN 6*  CREATININE 0.47  CALCIUM 9.0  AST 15  ALT 12  ALKPHOS 44  BILITOT 0.6   ------------------------------------------------------------------------------------------------------------------  Cardiac Enzymes No results for input(s): TROPONINI in the last 168 hours. ------------------------------------------------------------------------------------------------------------------  RADIOLOGY:  DG Chest Port 1 View  Result Date: 12/04/2021 CLINICAL DATA:  Shortness of breath, history of COPD EXAM: PORTABLE CHEST 1 VIEW COMPARISON:  11/04/2021 FINDINGS: Cardiac and mediastinal contours are within normal limits. No new focal pulmonary opacity. Changes related to COPD, including emphysema and bronchitic changes. No pleural effusion or pneumothorax. No acute osseous abnormality. IMPRESSION: COPD without additional acute cardiopulmonary process. Electronically Signed   By: Merilyn Baba M.D.   On: 12/04/2021 23:48      IMPRESSION AND PLAN:  Principal Problem:   COPD exacerbation ([place])  1.  COPD acute exacerbation likely secondary to acute bronchitis. - The patient will be admitted to a medical bed. - We will continue steroid therapy with IV Solu-Medrol. - We will continue bronchodilator therapy with DuoNebs scheduled and as needed basis. - Mucolytic therapy will be provided. - We will place her on p.o. Zithromax.  2.  Left buttock/SI joint arthritis/pain. - We will place on as needed IV Toradol and IV  fentanyl. - Lidoderm patch was resumed.  3.  Anxiety and depression. - We will continue Valium and Seroquel.  4.  Dyslipidemia. - We will resume statin therapy.  DVT prophylaxis: Lovenox. Code Status: full code. Family Communication:  The plan of care was discussed in details with the patient (and family). I answered all questions. The patient agreed to proceed with the above mentioned plan. Further management will depend upon hospital course. Disposition Plan: Back to previous home environment Consults called: none. All the records are reviewed and case discussed with ED provider.  Status is: Inpatient   At the time of the admission, it appears that the appropriate admission status for this patient is inpatient.  This is judged to be reasonable and necessary in order to provide the required intensity of service to ensure  the patient's safety given the presenting symptoms, physical exam findings and initial radiographic and laboratory data in the context of comorbid conditions.  The patient requires inpatient status due to high intensity of service, high risk of further deterioration and high frequency of surveillance required.  I certify that at the time of admission, it is my clinical judgment that the patient will require inpatient hospital care extending more than 2 midnights.                            Dispo: The patient is from: Home              Anticipated d/c is to: Home              Patient currently is not medically stable to d/c.              Difficult to place patient: No  Christel Mormon M.D on 12/05/2021 at 1:45 AM  [facility]   From 7 PM-7 AM, contact night-coverage [email or web address]  CC: Primary care physician; Remi Haggard, FNP

## 2021-12-06 MED ORDER — HYDROCORTISONE 1 % EX LOTN
TOPICAL_LOTION | Freq: Three times a day (TID) | CUTANEOUS | Status: DC | PRN
  Filled 2021-12-06 (×2): qty 118

## 2021-12-06 MED ORDER — IPRATROPIUM-ALBUTEROL 0.5-2.5 (3) MG/3ML IN SOLN
3.0000 mL | Freq: Three times a day (TID) | RESPIRATORY_TRACT | Status: DC
  Administered 2021-12-06 – 2021-12-10 (×13): 3 mL via RESPIRATORY_TRACT
  Filled 2021-12-06 (×14): qty 3

## 2021-12-06 MED ORDER — DIPHENHYDRAMINE HCL 50 MG/ML IJ SOLN
25.0000 mg | Freq: Four times a day (QID) | INTRAMUSCULAR | Status: DC | PRN
  Administered 2021-12-06 – 2021-12-10 (×5): 25 mg via INTRAVENOUS
  Filled 2021-12-06 (×5): qty 1

## 2021-12-06 MED ORDER — FAMOTIDINE 20 MG PO TABS
20.0000 mg | ORAL_TABLET | Freq: Every day | ORAL | Status: DC
  Administered 2021-12-06 – 2021-12-10 (×5): 20 mg via ORAL
  Filled 2021-12-06 (×5): qty 1

## 2021-12-06 MED ORDER — GABAPENTIN 600 MG PO TABS
300.0000 mg | ORAL_TABLET | Freq: Once | ORAL | Status: AC
  Administered 2021-12-06: 300 mg via ORAL
  Filled 2021-12-06: qty 1

## 2021-12-06 MED ORDER — GABAPENTIN 300 MG PO CAPS
600.0000 mg | ORAL_CAPSULE | Freq: Three times a day (TID) | ORAL | Status: DC
  Administered 2021-12-06: 600 mg via ORAL
  Filled 2021-12-06: qty 2

## 2021-12-06 MED ORDER — GABAPENTIN 300 MG PO CAPS
300.0000 mg | ORAL_CAPSULE | Freq: Three times a day (TID) | ORAL | Status: DC
  Filled 2021-12-06: qty 1

## 2021-12-06 NOTE — Progress Notes (Signed)
PROGRESS NOTE    Colleen [name]  RWE:[id number] DOB: [birth year] DOA: 12/04/2021 PCP: Remi Haggard, FNP   Chief Complaint  Patient presents with   Shortness of Breath   Back Pain  Brief Narrative/Hospital Course: Colleen [name], 61 y.o. female with PMH of asthma COPD chronic hypoxic aspiratory failure ongoing tobacco abuse, anxiety/depression chronic back pain history of osteoporosis/osteopenia presenting with dyspnea cough chest pain wheezing. She was seen in the ED was tachycardic tachypneic and wheezing COVID-19 negative chest x-ray COPD changes, patient is doing IV Toradol Solu-Medrol bronchodilators along with IV ketamine due to her back pain and patient was admitted for COPD exacerbation.   Subjective: Seen examined this morning.  Patient was standing up at the bedside. Breathing much better, on nasal cannula oxygen. Has ongoing chronic back pain  Assessment & Plan:  Acute COPD exacerbation: Overall much improved no more wheezing continue on oral steroids, bronchodilators.  Tobacco cessation.  Chronic hypoxic respiratory failure continue her home oxygen.  Chest pain she had negative cardiac cath in December.  Currently chest pain resolved  Chronic low back pain/left buttock pain, SI joint arthritis pain: Followed by pain management clinic Dr Holley Raring- cont on oral/IV opiates for pain control along with Toradol, refusing lidocaine patch.  Increase Neurontin dose, added Robaxin.  No neurological deficit, she will need to follow-up with her pain clinic coming Monday I did call the pain clinic and left a message for Dr. Holley Raring.  Patient reports she was due for her injection back in November  Anxiety/depression: Continue her home Valium and Seroquel.  She has recently started seeing psych.  HLD continue her statin Hyponatremia sodium improving.  Encourage oral intake  DVT prophylaxis: enoxaparin (LOVENOX) injection 40 mg Start: 12/05/21 0800 Code Status:   Code Status: Full  Code Family Communication: plan of care discussed with patient at bedside. Status is: Inpatient Remains inpatient appropriate because: Ongoing management of COPD exacerbation and chronic back pain Disposition: Currently not medically stable for discharge. Anticipated Disposition: Home tomorrow  Total time spent in the care of this patient 35 MINUTES Objective: Vitals last 24 hrs: Vitals:   12/06/21 0347 12/06/21 0726 12/06/21 0748 12/06/21 1153  BP: 135/87 132/89 129/89 137/87  Pulse: 87 90 88 85  Resp: 18 19 18 18   Temp: 98.4 F (36.9 C) 98.7 F (37.1 C) 98.2 F (36.8 C) 97.7 F (36.5 C)  TempSrc: Oral  Oral   SpO2: 97% 93% 98% 91%  Weight:      Height:       Weight change:   Intake/Output Summary (Last 24 hours) at 12/06/2021 1446 Last data filed at 12/06/2021 0900 Gross per 24 hour  Intake 1930.75 ml  Output --  Net 1930.75 ml   Net IO Since Admission: 2,170.03 mL [12/06/21 1446]   Physical Examination: General exam: AA0x3, weak,older than stated age. HEENT:Oral mucosa moist, Ear/Nose WNL grossly,dentition normal. Respiratory system: B/l diminished BS, no use of accessory muscle, non tender. Cardiovascular system: S1 & S2 +,No JVD. Gastrointestinal system: Abdomen soft, NT,ND, BS+. Nervous System:Alert, awake, moving extremities. Extremities: edema none, distal peripheral pulses palpable.  Skin: No rashes, no icterus. MSK: Normal muscle bulk, tone, power.  Medications reviewed:  Scheduled Meds:  atorvastatin  40 mg Oral Daily   azithromycin  500 mg Oral Daily   enoxaparin (LOVENOX) injection  40 mg Subcutaneous Q24H   fluticasone  2 spray Each Nare Daily   gabapentin  600 mg Oral TID   ipratropium-albuterol  3 mL Nebulization TID  lidocaine  1 patch Transdermal Q24H   predniSONE  40 mg Oral Q breakfast   QUEtiapine  25 mg Oral QHS   Continuous Infusions:  methocarbamol (ROBAXIN) IV 500 mg (12/06/21 1319)   Diet Order             Diet Heart Room  service appropriate? Yes; Fluid consistency: Thin  Diet effective now                          Weight change:   Wt Readings from Last 3 Encounters:  12/04/21 60 kg  11/06/21 59.9 kg  10/19/21 65.1 kg     Consultants:see note  Procedures:see note Antimicrobials: Anti-infectives (From admission, onward)    Start     Dose/Rate Route Frequency Ordered Stop   12/06/21 0600  azithromycin (ZITHROMAX) tablet 500 mg       See Hyperspace for full Linked Orders Report.   500 mg Oral Daily 12/05/21 0145 12/10/21 0959   12/05/21 0215  azithromycin (ZITHROMAX) 500 mg in sodium chloride 0.9 % 250 mL IVPB       See Hyperspace for full Linked Orders Report.   500 mg 250 mL/hr over 60 Minutes Intravenous Every 24 hours 12/05/21 0145 12/05/21 0330      Culture/Microbiology    Component Value Date/Time   SDES BLOOD BLOOD RIGHT HAND 10/11/2018 2037   SPECREQUEST  10/11/2018 2037    BOTTLES DRAWN AEROBIC AND ANAEROBIC Blood Culture adequate volume   CULT  10/11/2018 2037    NO GROWTH 5 DAYS Performed at [facility], [address] [place], [place] [address]    REPTSTATUS 10/16/2018 FINAL 10/11/2018 2037    Other culture-see note  Unresulted Labs (From admission, onward)    None     Data Reviewed: I have personally reviewed following labs and imaging studies CBC: Recent Labs  Lab 12/04/21 2336 12/05/21 0643  WBC 7.3 5.8  NEUTROABS 5.8  --   HGB 14.8 14.1  HCT 44.6 41.5  MCV 89.6 88.9  PLT 322 673   Basic Metabolic Panel: Recent Labs  Lab 12/04/21 2336 12/05/21 0643  NA 129* 130*  K 3.8 5.0  CL 92* 97*  CO2 27 26  GLUCOSE 99 141*  BUN 6* 6*  CREATININE 0.47 0.49  CALCIUM 9.0 8.2*   GFR: Estimated Creatinine Clearance: 59.8 mL/min (by C-G formula based on SCr of 0.49 mg/dL). Liver Function Tests: Recent Labs  Lab 12/04/21 2336  AST 15  ALT 12  ALKPHOS 44  BILITOT 0.6  PROT 7.5  ALBUMIN 4.2   No results for input(s): LIPASE, AMYLASE in  the last 168 hours. No results for input(s): AMMONIA in the last 168 hours. Coagulation Profile: No results for input(s): INR, PROTIME in the last 168 hours. Cardiac Enzymes: No results for input(s): CKTOTAL, CKMB, CKMBINDEX, TROPONINI in the last 168 hours. BNP (last 3 results) No results for input(s): PROBNP in the last 8760 hours. HbA1C: No results for input(s): HGBA1C in the last 72 hours. CBG: No results for input(s): GLUCAP in the last 168 hours. Lipid Profile: No results for input(s): CHOL, HDL, LDLCALC, TRIG, CHOLHDL, LDLDIRECT in the last 72 hours. Thyroid Function Tests: No results for input(s): TSH, T4TOTAL, FREET4, T3FREE, THYROIDAB in the last 72 hours. Anemia Panel: No results for input(s): VITAMINB12, FOLATE, FERRITIN, TIBC, IRON, RETICCTPCT in the last 72 hours. Sepsis Labs: No results for input(s): PROCALCITON, LATICACIDVEN in the last 168 hours.  Recent  Results (from the past 240 hour(s))  Resp Panel by RT-PCR (Flu A&B, Covid) Nasopharyngeal Swab     Status: None   Collection Time: 12/04/21 11:36 PM   Specimen: Nasopharyngeal Swab; Nasopharyngeal(NP) swabs in vial transport medium  Result Value Ref Range Status   SARS Coronavirus 2 by RT PCR NEGATIVE NEGATIVE Final    Comment: (NOTE) SARS-CoV-2 target nucleic acids are NOT DETECTED.  The SARS-CoV-2 RNA is generally detectable in upper respiratory specimens during the acute phase of infection. The lowest concentration of SARS-CoV-2 viral copies this assay can detect is 138 copies/mL. A negative result does not preclude SARS-Cov-2 infection and should not be used as the sole basis for treatment or other patient management decisions. A negative result may occur with  improper specimen collection/handling, submission of specimen other than nasopharyngeal swab, presence of viral mutation(s) within the areas targeted by this assay, and inadequate number of viral copies(<138 copies/mL). A negative result must be  combined with clinical observations, patient history, and epidemiological information. The expected result is Negative.  Fact Sheet for Patients:  EntrepreneurPulse.com.au  Fact Sheet for Healthcare Providers:  IncredibleEmployment.be  This test is no t yet approved or cleared by the [place] FDA and  has been authorized for detection and/or diagnosis of SARS-CoV-2 by FDA under an Emergency Use Authorization (EUA). This EUA will remain  in effect (meaning this test can be used) for the duration of the COVID-19 declaration under Section 564(b)(1) of the Act, 21 U.S.C.section 360bbb-3(b)(1), unless the authorization is terminated  or revoked sooner.       Influenza A by PCR NEGATIVE NEGATIVE Final   Influenza B by PCR NEGATIVE NEGATIVE Final    Comment: (NOTE) The Xpert Xpress SARS-CoV-2/FLU/RSV plus assay is intended as an aid in the diagnosis of influenza from Nasopharyngeal swab specimens and should not be used as a sole basis for treatment. Nasal washings and aspirates are unacceptable for Xpert Xpress SARS-CoV-2/FLU/RSV testing.  Fact Sheet for Patients: EntrepreneurPulse.com.au  Fact Sheet for Healthcare Providers: IncredibleEmployment.be  This test is not yet approved or cleared by the [place] FDA and has been authorized for detection and/or diagnosis of SARS-CoV-2 by FDA under an Emergency Use Authorization (EUA). This EUA will remain in effect (meaning this test can be used) for the duration of the COVID-19 declaration under Section 564(b)(1) of the Act, 21 U.S.C. section 360bbb-3(b)(1), unless the authorization is terminated or revoked.  Performed at [facility], [address], [place], [place] [address]      Radiology Studies: DG Chest [place] 1 View  Result Date: 12/04/2021 CLINICAL DATA:  Shortness of breath, history of COPD EXAM: PORTABLE CHEST 1 VIEW COMPARISON:   11/04/2021 FINDINGS: Cardiac and mediastinal contours are within normal limits. No new focal pulmonary opacity. Changes related to COPD, including emphysema and bronchitic changes. No pleural effusion or pneumothorax. No acute osseous abnormality. IMPRESSION: COPD without additional acute cardiopulmonary process. Electronically Signed   By: Merilyn Baba M.D.   On: 12/04/2021 23:48     LOS: 1 day   Antonieta Pert, MD [facility]  12/06/2021, 2:46 PM

## 2021-12-06 NOTE — TOC Initial Note (Signed)
Transition of Care [facility]) - Initial/Assessment Note    Patient Details  Name: Colleen [name] MRN: [id number] Date of Birth: 09-18-1961  Transition of Care [facility]) CM/SW Contact:    Candie Chroman, LCSW Phone Number: 12/06/2021, 10:28 AM  Clinical Narrative:   Readmission prevention screen complete. CSW met with patient. No supports at bedside. CSW introduced role and explained that discharge planning would be discussed. PCP is Threasa Alpha, FNP. Patient usually drives herself to appointments but husband will drive her if needed. [facility] [address]. No issues obtaining medications. No home health prior to admission. Patient uses a single-point cane as needed to get around at home. She is on 2 L oxygen at nighttime which is provided through [facility]. She was also set up with an NIV through [facility] in December but has only used it a couple of times because the mask makes her feel claustrophobic. Her husband will take her home at discharge. No further concerns. CSW encouraged patient to contact CSW as needed. CSW will continue to follow patient for support and facilitate return home when stable.              Expected Discharge Plan: Home/Self Care Barriers to Discharge: Continued Medical Work up   Patient Goals and CMS Choice        Expected Discharge Plan and Services Expected Discharge Plan: Home/Self Care     Post Acute Care Choice: NA Living arrangements for the past 2 months: Single Family Home                                      Prior Living Arrangements/Services Living arrangements for the past 2 months: Single Family Home Lives with:: Spouse Patient language and need for interpreter reviewed:: Yes Do you feel safe going back to the place where you live?: Yes      Need for Family Participation in Patient Care: Yes (Comment) Care giver support system in place?: Yes (comment) Current home services: DME Criminal Activity/Legal Involvement Pertinent  to Current Situation/Hospitalization: No - Comment as needed  Activities of Daily Living Home Assistive Devices/Equipment: Eyeglasses ADL Screening (condition at time of admission) Patient's cognitive ability adequate to safely complete daily activities?: Yes Is the patient deaf or have difficulty hearing?: No Does the patient have difficulty seeing, even when wearing glasses/contacts?: No Does the patient have difficulty concentrating, remembering, or making decisions?: No Patient able to express need for assistance with ADLs?: Yes Does the patient have difficulty dressing or bathing?: No Independently performs ADLs?: Yes (appropriate for developmental age) Does the patient have difficulty walking or climbing stairs?: No Weakness of Legs: None Weakness of Arms/Hands: None  Permission Sought/Granted                  Emotional Assessment Appearance:: Appears stated age Attitude/Demeanor/Rapport: Engaged, Gracious Affect (typically observed): Accepting, Appropriate, Calm, Pleasant Orientation: : Oriented to Self, Oriented to Place, Oriented to  Time, Oriented to Situation Alcohol / Substance Use: Not Applicable Psych Involvement: No (comment)  Admission diagnosis:  COPD exacerbation ([place]) [J44.1] Patient Active Problem List   Diagnosis Date Noted   At risk for prolonged QT interval syndrome 11/19/2021   Hyperlipidemia, mixed 11/18/2021   Coronary artery disease involving native coronary artery of native heart 11/18/2021   Bilateral carotid artery stenosis 11/18/2021   NSTEMI (non-ST elevated myocardial infarction) ([place]) 11/05/2021   Chronic respiratory failure  with hypoxia ([place]) 11/05/2021   Bereavement 10/23/2021   Acute exacerbation of chronic obstructive pulmonary disease (COPD) ([place]) 10/20/2021   COPD with acute exacerbation ([place]) 10/19/2021   Hyponatremia 10/19/2021   MDD (major depressive disorder), recurrent, in full remission ([place]) 07/29/2021   Tobacco use disorder  06/19/2021   Insomnia due to medical condition 03/21/2021   MDD (major depressive disorder), recurrent episode, moderate ([place]) 03/04/2021   PTSD (post-traumatic stress disorder) 03/04/2021   Myofascial pain syndrome 07/09/2020   Lumbar radiculopathy 07/09/2020   Chronic pain syndrome 06/04/2020   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, L5/S1) 06/04/2020   Lumbar facet arthropathy (L4,5 S1) 06/04/2020   Cervical facet joint syndrome 06/04/2020   Osteopenia of multiple sites 06/04/2020   Age related osteoporosis 06/04/2020   Low back pain 04/27/2020   COPD exacerbation ([place]) 03/20/2020   Asthma, chronic, unspecified asthma severity, with acute exacerbation 03/20/2020   Panic attack 03/20/2020   Acute on chronic respiratory failure with hypoxia ([place])    Sepsis ([place]) 10/11/2018   CAP (community acquired pneumonia) 10/11/2018   COPD (chronic obstructive pulmonary disease) ([place]) 10/11/2018   GAD (generalized anxiety disorder) 10/11/2018   PCP:  Remi Haggard, FNP [id number] [id number]: [phone]: [phone]    Social Determinants of Health (SDOH) Interventions    Readmission Risk Interventions Readmission Risk Prevention Plan 12/06/2021  Transportation Screening Complete  PCP or Specialist Appt within 5-7 Days Complete  Medication Review (RN CM) Complete  Some recent data might be hidden

## 2021-12-06 NOTE — Progress Notes (Signed)
MD made aware that patients rash that was present on foot and ankle on admission has now spread up patients legs and on hands. IV benadryl given.

## 2021-12-07 ENCOUNTER — Inpatient Hospital Stay

## 2021-12-07 LAB — BASIC METABOLIC PANEL
Anion gap: 7 (ref 5–15)
Anion gap: 8 (ref 5–15)
BUN: 6 mg/dL — ABNORMAL LOW (ref 8–23)
BUN: 8 mg/dL (ref 8–23)
CO2: 27 mmol/L (ref 22–32)
CO2: 28 mmol/L (ref 22–32)
Calcium: 8.2 mg/dL — ABNORMAL LOW (ref 8.9–10.3)
Calcium: 8.8 mg/dL — ABNORMAL LOW (ref 8.9–10.3)
Chloride: 92 mmol/L — ABNORMAL LOW (ref 98–111)
Chloride: 91 mmol/L — ABNORMAL LOW (ref 98–111)
Creatinine, Ser: 0.42 mg/dL — ABNORMAL LOW (ref 0.44–1.00)
Creatinine, Ser: 0.5 mg/dL (ref 0.44–1.00)
GFR, Estimated: >60 mL/min (ref >60)
GFR, Estimated: >60 mL/min (ref >60)
Glucose, Bld: 91 mg/dL (ref 70–99)
Glucose, Bld: 170 mg/dL — ABNORMAL HIGH (ref 70–99)
Potassium: 4.4 mmol/L (ref 3.5–5.1)
Potassium: 4.6 mmol/L (ref 3.5–5.1)
Sodium: 126 mmol/L — ABNORMAL LOW (ref 135–145)
Sodium: 127 mmol/L — ABNORMAL LOW (ref 135–145)

## 2021-12-07 LAB — OSMOLALITY, URINE: Osmolality, Ur: 255 mOsm/kg — ABNORMAL LOW (ref 300–900)

## 2021-12-07 LAB — SODIUM, URINE, RANDOM: Sodium, Ur: 82 mmol/L

## 2021-12-07 IMAGING — DX DG CHEST 1V PORT
1 series · 1 of 1 positions shown · non-contrast
Comparison: Chest x-ray [DATE].

CLINICAL DATA: 60-year-old female with history of COPD. Right-sided
chest pain.

EXAM:
PORTABLE CHEST 1 VIEW

[chest ap]
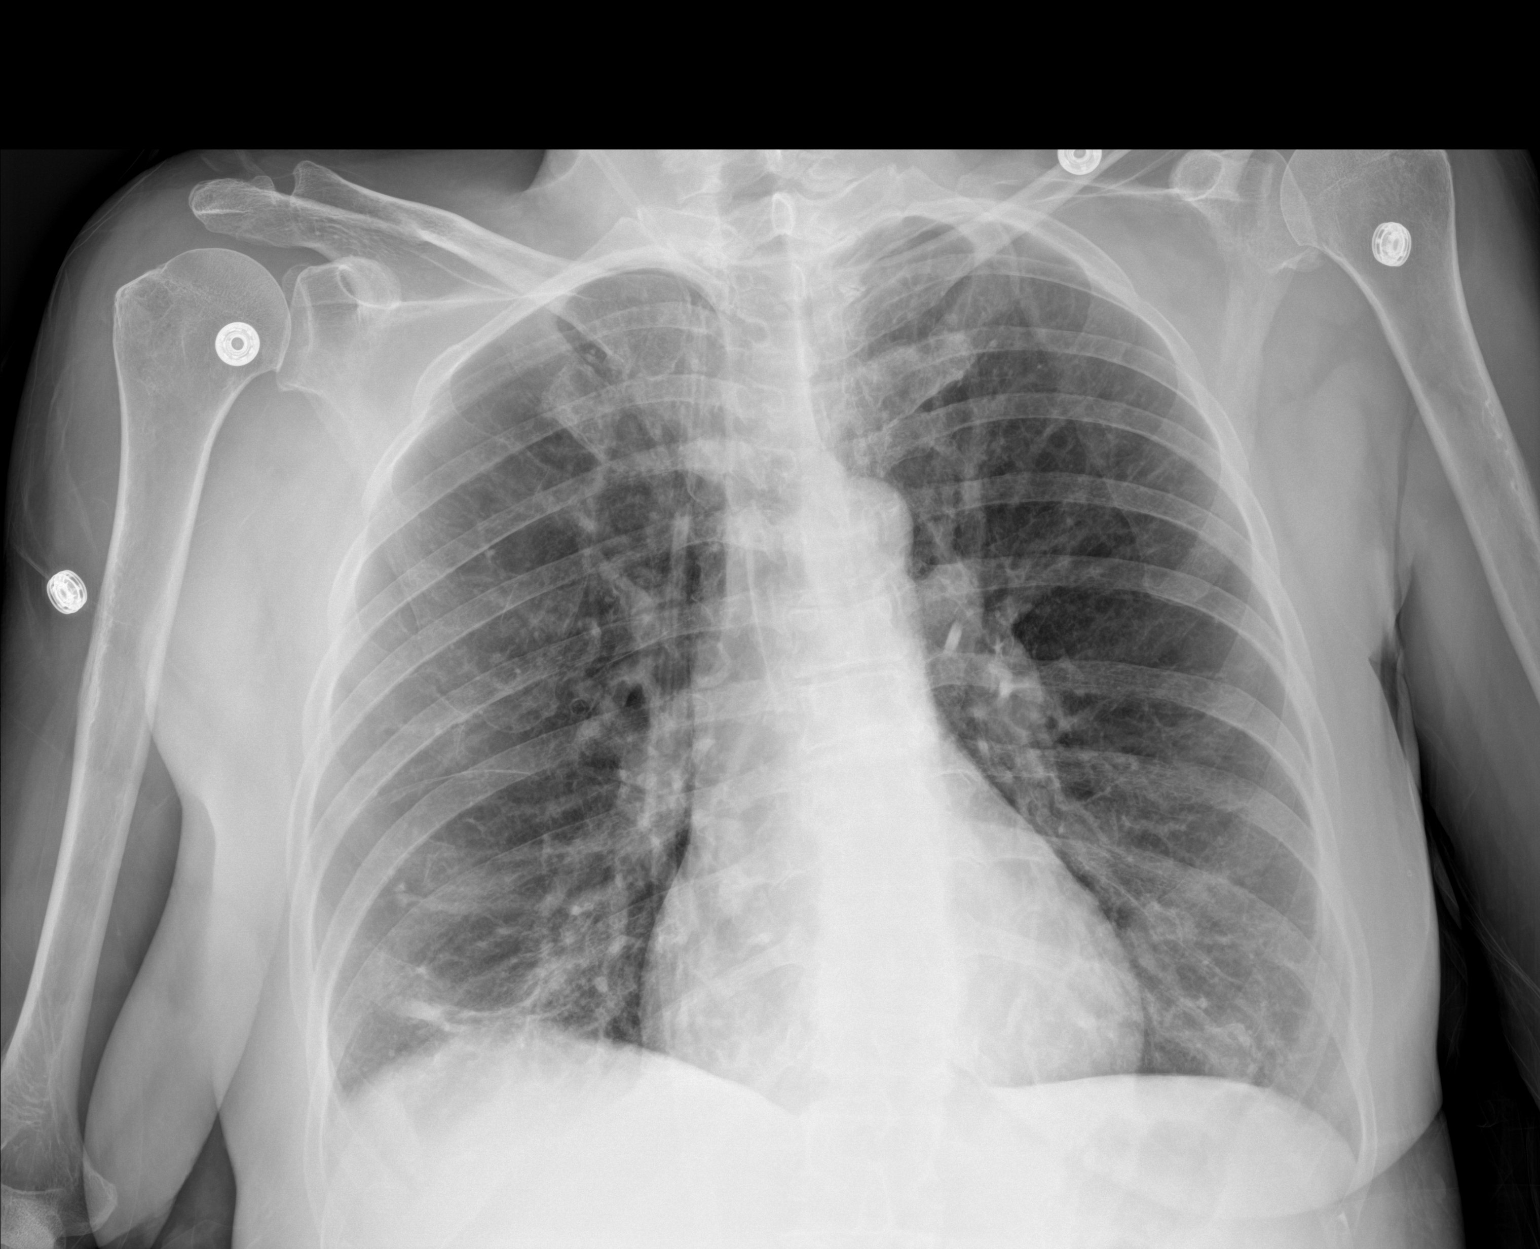

[1 of 1 positions shown; findings below may reference images not displayed]

FINDINGS: Opacity at the right lung base favored to reflect subsegmental
atelectasis, although a focus of airspace consolidation is not
excluded. Small right pleural effusion. Left lung is clear. No left
pleural effusion. No pneumothorax. No evidence of pulmonary edema.
Heart size is normal. Upper mediastinal contours are within normal
limits. Atherosclerotic calcifications in the thoracic aorta.
IMPRESSION: 1. Small right pleural effusion with atelectasis and/or
consolidation in the right lung base.
2. Aortic atherosclerosis.

## 2021-12-07 MED ORDER — DM-GUAIFENESIN ER 30-600 MG PO TB12
1.0000 | ORAL_TABLET | Freq: Two times a day (BID) | ORAL | Status: DC
  Administered 2021-12-07 – 2021-12-10 (×7): 1 via ORAL
  Filled 2021-12-07 (×7): qty 1

## 2021-12-07 MED ORDER — KETOROLAC TROMETHAMINE 30 MG/ML IJ SOLN: 15.0000 mg | Freq: Four times a day (QID) | INTRAMUSCULAR | Status: DC | PRN

## 2021-12-07 MED ORDER — CYCLOBENZAPRINE HCL 10 MG PO TABS
5.0000 mg | ORAL_TABLET | Freq: Three times a day (TID) | ORAL | Status: DC | PRN
  Administered 2021-12-08 – 2021-12-10 (×6): 5 mg via ORAL
  Filled 2021-12-07 (×6): qty 1

## 2021-12-07 MED ORDER — DIAZEPAM 2 MG PO TABS
2.0000 mg | ORAL_TABLET | Freq: Three times a day (TID) | ORAL | Status: DC | PRN
  Administered 2021-12-07 – 2021-12-10 (×7): 2 mg via ORAL
  Filled 2021-12-07 (×7): qty 1

## 2021-12-07 MED ORDER — DIAZEPAM 2 MG PO TABS
2.0000 mg | ORAL_TABLET | Freq: Two times a day (BID) | ORAL | Status: DC | PRN
  Administered 2021-12-07: 2 mg via ORAL
  Filled 2021-12-07: qty 1

## 2021-12-07 MED ORDER — AZITHROMYCIN 500 MG PO TABS: 500.0000 mg | ORAL_TABLET | Freq: Every day | ORAL | Status: DC

## 2021-12-07 MED ORDER — SODIUM CHLORIDE 1 G PO TABS
1.0000 g | ORAL_TABLET | Freq: Two times a day (BID) | ORAL | Status: DC
  Administered 2021-12-07 – 2021-12-10 (×6): 1 g via ORAL
  Filled 2021-12-07 (×6): qty 1

## 2021-12-07 MED ORDER — SODIUM CHLORIDE 0.9 % IV SOLN: INTRAVENOUS | Status: DC

## 2021-12-07 MED ORDER — DOXYCYCLINE HYCLATE 100 MG PO TABS
100.0000 mg | ORAL_TABLET | Freq: Two times a day (BID) | ORAL | Status: DC
  Administered 2021-12-07 – 2021-12-09 (×5): 100 mg via ORAL
  Filled 2021-12-07 (×5): qty 1

## 2021-12-07 NOTE — Progress Notes (Signed)
Patient c/o new sharp pain to right lateral rib cage which happened last night after coughing.  She reports of pain level  10 out of 10.  Reports she feels like she "pulled something".  Dr. Maren Beach made aware and ordered a chest xray.

## 2021-12-07 NOTE — Progress Notes (Signed)
PROGRESS NOTE    Colleen [name]  PYP:[id number] DOB: 1961/05/19 DOA: 12/04/2021 PCP: Remi Haggard, FNP   Chief Complaint  Patient presents with   Shortness of Breath   Back Pain  Brief Narrative/Hospital Course: Colleen [name], 61 y.o. female with PMH of asthma COPD chronic hypoxic aspiratory failure ongoing tobacco abuse, anxiety/depression chronic back pain history of osteoporosis/osteopenia presenting with dyspnea cough chest pain wheezing. She was seen in the ED was tachycardic tachypneic and wheezing COVID-19 negative chest x-ray COPD changes, patient is doing IV Toradol Solu-Medrol bronchodilators along with IV ketamine due to her back pain and patient was admitted for COPD exacerbation. Patient also having rash 2 to 3 days prior to admission in her feet.  Subjective:  Seen examined this morning complains of right-sided pleuritic chest pain, chronic back pain, also had erythematous pruritic rash on bilateral feet and LE worse from prior No significant dyspnea remains on 2-3 to nasal cannula home setting. No focal weakness red flag signs lower extremities  Assessment & Plan:  Acute COPD exacerbation: Respiratory status overall better we will continue on oral prednisone bronchodilators antitussives cont Tobacco cessation.  Chronic hypoxic respiratory failure continue her home oxygen 3l.  Pleuritic chest pain mostly on the right side now, chest x-ray showed small right pleural effusion with atelectasis and/or consolidation in the right lung base, we will keep her on doxycycline, antitussives.   Chronic low back pain/left buttock pain, SI joint arthritis pain: Followed by pain management clinic Dr Holley Raring.  Patient reports she was getting Toradol and had received by her primary care doctor 3 days prior to admission, currently getting Toradol, lidocaine patch, IV fentanyl. Neurontin dose was increased but she was refusing to take it due to rash-she reports she was taking it  sporadically at home.  Complains a lot of anxiety issues. Cont Robaxin.  No neurological deficit in LE,she will need to follow-up with her pain clinic coming week. I did call the pain clinic and left a message for Dr. Holley Raring.  Patient reports she was due for her injection back in November  Anxiety/depression: Continue her home Valium and Seroquel= she has not been taking Seroquel here, per request increase Valium to twice daily as needed.  Has not had enough sleep.  She is following psychiatrist as outpatient.  HLD continue her statin Hyponatremia sodium further downtrending, I will cut down her Toradol, check urine electrolytes add gentle IV fluids, salt tablets.  ??  SIADH in the setting of pain-we will see how the fluid challenge does.  Erythematous rash on lower extremity and upper extremities , rash was present prior to admissions ?  Socks, unclear etiology- no new medications, patient reports she has tolerated Toradol very well and had gotten few days prior to admission from PCP, we will hold off azithromycin further dosing.  She has been sporadically taking Neurontin at home refusing to take here thinking it can be causing rash.  Not taking Seroquel and discontinued.  Rash seems to be worsening. No respiratory or any angioedema.  We will continue Pepcid Benadryl hydrocortisone cream along with steroid she is getting for COPD-if worse, change to IV steroid.  She will need to follow-up with dermatology as outpatient.      DVT prophylaxis: enoxaparin (LOVENOX) injection 40 mg Start: 12/05/21 0800 Code Status:   Code Status: Full Code Family Communication: plan of care discussed with patient at bedside. Status is: Inpatient Remains inpatient appropriate because: Ongoing management of COPD exacerbation and chronic back pain Disposition:  Currently not medically stable for discharge. Anticipated Disposition: Home tomorrow  Total time spent in the care of this patient 35 MINUTES Objective: Vitals  last 24 hrs: Vitals:   12/07/21 0409 12/07/21 0734 12/07/21 0736 12/07/21 1140  BP: (!) 173/97 (!) 156/98  (!) 143/90  Pulse: 93 92  85  Resp: 20 (!) 21  20  Temp: 97.6 F (36.4 C) 98.2 F (36.8 C)  98.2 F (36.8 C)  TempSrc: Oral     SpO2: 98% 95% 95% 96%  Weight:      Height:       Weight change:   Intake/Output Summary (Last 24 hours) at 12/07/2021 1148 Last data filed at 12/07/2021 1100 Gross per 24 hour  Intake 240 ml  Output --  Net 240 ml   Net IO Since Admission: 2,410.03 mL [12/07/21 1148]   Physical Examination: General exam: AAOx 3, anxious, older than stated age, weak appearing. HEENT:Oral mucosa moist, Ear/Nose WNL grossly, dentition normal. Respiratory system: bilaterally clear, no use of accessory muscle Cardiovascular system: S1 & S2 +, No JVD,. Gastrointestinal system: Abdomen soft,NT,ND, BS+ Nervous System:Alert, awake, moving extremities and grossly nonfocal Extremities: no edema, distal peripheral pulses palpable.  Skin: Erythematous rash on lower extremities and upper extremities  MSK: Normal muscle bulk,tone, power   Medications reviewed:  Scheduled Meds:  atorvastatin  40 mg Oral Daily   dextromethorphan-guaiFENesin  1 tablet Oral BID   doxycycline  100 mg Oral Q12H   enoxaparin (LOVENOX) injection  40 mg Subcutaneous Q24H   famotidine  20 mg Oral Daily   fluticasone  2 spray Each Nare Daily   ipratropium-albuterol  3 mL Nebulization TID   lidocaine  1 patch Transdermal Q24H   predniSONE  40 mg Oral Q breakfast   sodium chloride  1 g Oral BID WC   Continuous Infusions:  sodium chloride     methocarbamol (ROBAXIN) IV 500 mg (12/07/21 0452)   Diet Order             Diet Heart Room service appropriate? Yes; Fluid consistency: Thin  Diet effective now                          Weight change:   Wt Readings from Last 3 Encounters:  12/04/21 60 kg  11/06/21 59.9 kg  10/19/21 65.1 kg     Consultants:see note  Procedures:see  note Antimicrobials: Anti-infectives (From admission, onward)    Start     Dose/Rate Route Frequency Ordered Stop   12/07/21 1030  azithromycin (ZITHROMAX) tablet 500 mg  Status:  Discontinued        500 mg Oral Daily 12/07/21 0936 12/07/21 0942   12/07/21 1030  doxycycline (VIBRA-TABS) tablet 100 mg        100 mg Oral Every 12 hours 12/07/21 0942     12/06/21 0600  azithromycin (ZITHROMAX) tablet 500 mg  Status:  Discontinued       See Hyperspace for full Linked Orders Report.   500 mg Oral Daily 12/05/21 0145 12/06/21 1818   12/05/21 0215  azithromycin (ZITHROMAX) 500 mg in sodium chloride 0.9 % 250 mL IVPB       See Hyperspace for full Linked Orders Report.   500 mg 250 mL/hr over 60 Minutes Intravenous Every 24 hours 12/05/21 0145 12/05/21 0330      Culture/Microbiology    Component Value Date/Time   SDES BLOOD BLOOD RIGHT HAND 10/11/2018 2037   SPECREQUEST  10/11/2018 2037    BOTTLES DRAWN AEROBIC AND ANAEROBIC Blood Culture adequate volume   CULT  10/11/2018 2037    NO GROWTH 5 DAYS Performed at [facility], [address] [place], [place] [address]    REPTSTATUS 10/16/2018 FINAL 10/11/2018 2037    Other culture-see note  Unresulted Labs (From admission, onward)     Start     Ordered   12/08/21 4782  Basic metabolic panel  Daily,   R     Question:  Specimen collection method  Answer:  Lab=Lab collect   12/07/21 1148   12/07/21 1139  Sodium, urine, random  Once,   R        12/07/21 1138   12/07/21 1139  Osmolality, urine  Once,   R        12/07/21 1138          Data Reviewed: I have personally reviewed following labs and imaging studies CBC: Recent Labs  Lab 12/04/21 2336 12/05/21 0643  WBC 7.3 5.8  NEUTROABS 5.8  --   HGB 14.8 14.1  HCT 44.6 41.5  MCV 89.6 88.9  PLT 322 956   Basic Metabolic Panel: Recent Labs  Lab 12/04/21 2336 12/05/21 0643 12/07/21 1002  NA 129* 130* 126*  K 3.8 5.0 4.4  CL 92* 97* 92*  CO2 27 26 27   GLUCOSE  99 141* 91  BUN 6* 6* 6*  CREATININE 0.47 0.49 0.42*  CALCIUM 9.0 8.2* 8.2*   GFR: Estimated Creatinine Clearance: 59.8 mL/min (A) (by C-G formula based on SCr of 0.42 mg/dL (L)). Liver Function Tests: Recent Labs  Lab 12/04/21 2336  AST 15  ALT 12  ALKPHOS 44  BILITOT 0.6  PROT 7.5  ALBUMIN 4.2   No results for input(s): LIPASE, AMYLASE in the last 168 hours. No results for input(s): AMMONIA in the last 168 hours. Coagulation Profile: No results for input(s): INR, PROTIME in the last 168 hours. Cardiac Enzymes: No results for input(s): CKTOTAL, CKMB, CKMBINDEX, TROPONINI in the last 168 hours. BNP (last 3 results) No results for input(s): PROBNP in the last 8760 hours. HbA1C: No results for input(s): HGBA1C in the last 72 hours. CBG: No results for input(s): GLUCAP in the last 168 hours. Lipid Profile: No results for input(s): CHOL, HDL, LDLCALC, TRIG, CHOLHDL, LDLDIRECT in the last 72 hours. Thyroid Function Tests: No results for input(s): TSH, T4TOTAL, FREET4, T3FREE, THYROIDAB in the last 72 hours. Anemia Panel: No results for input(s): VITAMINB12, FOLATE, FERRITIN, TIBC, IRON, RETICCTPCT in the last 72 hours. Sepsis Labs: No results for input(s): PROCALCITON, LATICACIDVEN in the last 168 hours.  Recent Results (from the past 240 hour(s))  Resp Panel by RT-PCR (Flu A&B, Covid) Nasopharyngeal Swab     Status: None   Collection Time: 12/04/21 11:36 PM   Specimen: Nasopharyngeal Swab; Nasopharyngeal(NP) swabs in vial transport medium  Result Value Ref Range Status   SARS Coronavirus 2 by RT PCR NEGATIVE NEGATIVE Final    Comment: (NOTE) SARS-CoV-2 target nucleic acids are NOT DETECTED.  The SARS-CoV-2 RNA is generally detectable in upper respiratory specimens during the acute phase of infection. The lowest concentration of SARS-CoV-2 viral copies this assay can detect is 138 copies/mL. A negative result does not preclude SARS-Cov-2 infection and should not be used  as the sole basis for treatment or other patient management decisions. A negative result may occur with  improper specimen collection/handling, submission of specimen other than nasopharyngeal swab, presence of viral mutation(s) within  the areas targeted by this assay, and inadequate number of viral copies(<138 copies/mL). A negative result must be combined with clinical observations, patient history, and epidemiological information. The expected result is Negative.  Fact Sheet for Patients:  EntrepreneurPulse.com.au  Fact Sheet for Healthcare Providers:  IncredibleEmployment.be  This test is no t yet approved or cleared by the [place] FDA and  has been authorized for detection and/or diagnosis of SARS-CoV-2 by FDA under an Emergency Use Authorization (EUA). This EUA will remain  in effect (meaning this test can be used) for the duration of the COVID-19 declaration under Section 564(b)(1) of the Act, 21 U.S.C.section 360bbb-3(b)(1), unless the authorization is terminated  or revoked sooner.       Influenza A by PCR NEGATIVE NEGATIVE Final   Influenza B by PCR NEGATIVE NEGATIVE Final    Comment: (NOTE) The Xpert Xpress SARS-CoV-2/FLU/RSV plus assay is intended as an aid in the diagnosis of influenza from Nasopharyngeal swab specimens and should not be used as a sole basis for treatment. Nasal washings and aspirates are unacceptable for Xpert Xpress SARS-CoV-2/FLU/RSV testing.  Fact Sheet for Patients: EntrepreneurPulse.com.au  Fact Sheet for Healthcare Providers: IncredibleEmployment.be  This test is not yet approved or cleared by the [place] FDA and has been authorized for detection and/or diagnosis of SARS-CoV-2 by FDA under an Emergency Use Authorization (EUA). This EUA will remain in effect (meaning this test can be used) for the duration of the COVID-19 declaration under Section 564(b)(1)  of the Act, 21 U.S.C. section 360bbb-3(b)(1), unless the authorization is terminated or revoked.  Performed at [facility], [address], [place], [place] [address]      Radiology Studies: [facility] Chest [place] 1 View  Result Date: 12/07/2021 CLINICAL DATA:  61 year old female with history of COPD. Right-sided chest pain. EXAM: PORTABLE CHEST 1 VIEW COMPARISON:  Chest x-ray 12/04/2021. FINDINGS: Opacity at the right lung base favored to reflect subsegmental atelectasis, although a focus of airspace consolidation is not excluded. Small right pleural effusion. Left lung is clear. No left pleural effusion. No pneumothorax. No evidence of pulmonary edema. Heart size is normal. Upper mediastinal contours are within normal limits. Atherosclerotic calcifications in the thoracic aorta. IMPRESSION: 1. Small right pleural effusion with atelectasis and/or consolidation in the right lung base. 2. Aortic atherosclerosis. Electronically Signed   By: Vinnie Langton M.D.   On: 12/07/2021 08:44     LOS: 2 days   Antonieta Pert, MD [facility]  12/07/2021, 11:48 AM

## 2021-12-08 LAB — BASIC METABOLIC PANEL
Anion gap: 9 (ref 5–15)
BUN: 6 mg/dL — ABNORMAL LOW (ref 8–23)
CO2: 28 mmol/L (ref 22–32)
Calcium: 8.7 mg/dL — ABNORMAL LOW (ref 8.9–10.3)
Chloride: 95 mmol/L — ABNORMAL LOW (ref 98–111)
Creatinine, Ser: 0.46 mg/dL (ref 0.44–1.00)
GFR, Estimated: >60 mL/min (ref >60)
Glucose, Bld: 87 mg/dL (ref 70–99)
Potassium: 4.2 mmol/L (ref 3.5–5.1)
Sodium: 132 mmol/L — ABNORMAL LOW (ref 135–145)

## 2021-12-08 MED ORDER — KETOROLAC TROMETHAMINE 30 MG/ML IJ SOLN
30.0000 mg | Freq: Once | INTRAMUSCULAR | Status: AC
  Administered 2021-12-08: 30 mg via INTRAVENOUS
  Filled 2021-12-08: qty 1

## 2021-12-08 NOTE — Progress Notes (Signed)
PROGRESS NOTE    Colleen [name]  VQM:[id number] DOB: Mar 21, 1961 DOA: 12/04/2021 PCP: Remi Haggard, FNP   Chief Complaint  Patient presents with   Shortness of Breath   Back Pain  Brief Narrative/Hospital Course: Colleen [name], 61 y.o. female with PMH of asthma COPD chronic hypoxic aspiratory failure ongoing tobacco abuse, anxiety/depression chronic back pain history of osteoporosis/osteopenia presenting with dyspnea cough chest pain wheezing. She was seen in the ED was tachycardic tachypneic and wheezing COVID-19 negative chest x-ray COPD changes, patient is doing IV Toradol Solu-Medrol bronchodilators along with IV ketamine due to her back pain and patient was admitted for COPD exacerbation. Patient also having rash 2 to 3 days prior to admission in her feet. Patient rash was improving after starting to get worse.  She complains of ongoing anxiety issues.  Subjective: Rash is improving from hand and lower extremity Complains of ongoing pain in the back and panic attack/anxiety Reports Flexeril helping Respiratory status is stable no focal location lower extremities  Assessment & Plan:  Acute COPD exacerbation: Clinically improved continue oral prednisone, bronchodilators as needed.  Continue  antitussives cont Tobacco cessation.  Chronic hypoxic respiratory failure continue her home oxygen 3l.  Stable  Pleuritic chest pain mostly on the right side now, chest x-ray showed small right pleural effusion with atelectasis and/or consolidation in the right lung base, doing well on oral doxycycline.  Continue antitussives pain control.   Chronic low back pain/left buttock pain, SI joint arthritis pain: Followed by pain management clinic Dr Holley Raring.  Patient reports she was getting Toradol and had received by her primary care doctor 3 days prior to admission.Neurontin dose was increased but she was refusing to take it due to rash-she reports she was taking it sporadically at home.   Complains a lot of anxiety issues. Cont Robaxin, Flexeril added with some improvement, continue Valium every 8 hour as needed, will give Toradol x1 this morning.No neurological deficit in LE,she will need to follow-up with her pain clinic early coming week, I did call the pain clinic and left a message for Dr. Holley Raring.  Patient reports she was due for her injection back in November.  She plans to follow-up with her pain specialist.  Anxiety/depression: Still comes and complains of anxiety issues, she has started seeing psychiatrist recently and placed on Valium Seroquel but has been refusing Seroquel here.  Continue Valium Mannam, muscle relaxant.    HLD continue her statin Hyponatremia does have chronic hyponatremia sodium now improved stop IV fluids continue salt tablets.   Recent Labs  Lab 12/04/21 2336 12/05/21 0643 12/07/21 1002 12/07/21 1904 12/08/21 0552  NA 129* 130* 126* 127* 132*    Erythematous rash on LE and UE: rash was present prior to admissions ?  Socks, unclear etiology- no new medications, patient reports she has tolerated Toradol very well and had gotten few days prior to admission from PCP, we will hold off azithromycin further dosing.  She has been sporadically taking Neurontin at home refusing to take here thinking it can be causing rash.  Not taking Seroquel and discontinued.  Rest now seems to be improving, continue Benadryl, Pepcid, hydrocortisone lotion, already on steroid for COPD.  Advised follow-up with dermatology as outpatient.  DVT prophylaxis: enoxaparin (LOVENOX) injection 40 mg Start: 12/05/21 0800 Code Status:   Code Status: Full Code Family Communication: plan of care discussed with patient at bedside. Status is: Inpatient Remains inpatient appropriate because: Ongoing management of COPD exacerbation and chronic back pain Disposition: Currently  not medically stable for discharge. Anticipated Disposition: Anticipate home tomorrow.  Stable   Total time spent  in the care of this patient 35 MINUTES Objective: Vitals last 24 hrs: Vitals:   12/08/21 0036 12/08/21 0358 12/08/21 0753 12/08/21 0920  BP: (!) 152/93 (!) 148/96  (!) 157/95  Pulse: 80 84  90  Resp: 18 18  18   Temp: 97.9 F (36.6 C) 98.4 F (36.9 C)  97.6 F (36.4 C)  TempSrc: Oral Oral  Oral  SpO2: 98% 96% 97% 99%  Weight:      Height:       Weight change:   Intake/Output Summary (Last 24 hours) at 12/08/2021 1145 Last data filed at 12/08/2021 0900 Gross per 24 hour  Intake 1517.14 ml  Output 1100 ml  Net 417.14 ml    Net IO Since Admission: 2,827.17 mL [12/08/21 1145]   Physical Examination: General exam: AAOx 3, anxious older than stated age, weak appearing. HEENT:Oral mucosa moist, Ear/Nose WNL grossly, dentition normal. Respiratory system: bilaterally clear, no use of accessory muscle Cardiovascular system: S1 & S2 +, No JVD,. Gastrointestinal system: Abdomen soft,NT,ND, BS+ Nervous System:Alert, awake, moving extremities and grossly nonfocal Extremities: no edema, distal peripheral pulses palpable.  Skin: No rashes,no icterus. MSK: Normal muscle bulk,tone, power   Medications reviewed:  Scheduled Meds:  atorvastatin  40 mg Oral Daily   dextromethorphan-guaiFENesin  1 tablet Oral BID   doxycycline  100 mg Oral Q12H   enoxaparin (LOVENOX) injection  40 mg Subcutaneous Q24H   famotidine  20 mg Oral Daily   fluticasone  2 spray Each Nare Daily   ipratropium-albuterol  3 mL Nebulization TID   lidocaine  1 patch Transdermal Q24H   predniSONE  40 mg Oral Q breakfast   sodium chloride  1 g Oral BID WC   Continuous Infusions:  methocarbamol (ROBAXIN) IV Stopped (12/07/21 1450)   Diet Order             Diet regular Room service appropriate? Yes; Fluid consistency: Thin  Diet effective now                          Weight change:   Wt Readings from Last 3 Encounters:  12/04/21 60 kg  11/06/21 59.9 kg  10/19/21 65.1 kg     Consultants:see note   Procedures:see note Antimicrobials: Anti-infectives (From admission, onward)    Start     Dose/Rate Route Frequency Ordered Stop   12/07/21 1030  azithromycin (ZITHROMAX) tablet 500 mg  Status:  Discontinued        500 mg Oral Daily 12/07/21 0936 12/07/21 0942   12/07/21 1030  doxycycline (VIBRA-TABS) tablet 100 mg        100 mg Oral Every 12 hours 12/07/21 0942     12/06/21 0600  azithromycin (ZITHROMAX) tablet 500 mg  Status:  Discontinued       See Hyperspace for full Linked Orders Report.   500 mg Oral Daily 12/05/21 0145 12/06/21 1818   12/05/21 0215  azithromycin (ZITHROMAX) 500 mg in sodium chloride 0.9 % 250 mL IVPB       See Hyperspace for full Linked Orders Report.   500 mg 250 mL/hr over 60 Minutes Intravenous Every 24 hours 12/05/21 0145 12/05/21 0330      Culture/Microbiology    Component Value Date/Time   SDES BLOOD BLOOD RIGHT HAND 10/11/2018 2037   SPECREQUEST  10/11/2018 2037    BOTTLES DRAWN AEROBIC AND  ANAEROBIC Blood Culture adequate volume   CULT  10/11/2018 2037    NO GROWTH 5 DAYS Performed at [facility], [address] [place], [place] [address]    REPTSTATUS 10/16/2018 FINAL 10/11/2018 2037    Other culture-see note  Unresulted Labs (From admission, onward)     Start     Ordered   12/08/21 6063  Basic metabolic panel  Daily,   R     Question:  Specimen collection method  Answer:  Lab=Lab collect   12/07/21 1148          Data Reviewed: I have personally reviewed following labs and imaging studies CBC: Recent Labs  Lab 12/04/21 2336 12/05/21 0643  WBC 7.3 5.8  NEUTROABS 5.8  --   HGB 14.8 14.1  HCT 44.6 41.5  MCV 89.6 88.9  PLT 322 016    Basic Metabolic Panel: Recent Labs  Lab 12/04/21 2336 12/05/21 0643 12/07/21 1002 12/07/21 1904 12/08/21 0552  NA 129* 130* 126* 127* 132*  K 3.8 5.0 4.4 4.6 4.2  CL 92* 97* 92* 91* 95*  CO2 27 26 27 28 28   GLUCOSE 99 141* 91 170* 87  BUN 6* 6* 6* 8 6*  CREATININE 0.47 0.49  0.42* 0.50 0.46  CALCIUM 9.0 8.2* 8.2* 8.8* 8.7*    GFR: Estimated Creatinine Clearance: 59.8 mL/min (by C-G formula based on SCr of 0.46 mg/dL). Liver Function Tests: Recent Labs  Lab 12/04/21 2336  AST 15  ALT 12  ALKPHOS 44  BILITOT 0.6  PROT 7.5  ALBUMIN 4.2    No results for input(s): LIPASE, AMYLASE in the last 168 hours. No results for input(s): AMMONIA in the last 168 hours. Coagulation Profile: No results for input(s): INR, PROTIME in the last 168 hours. Cardiac Enzymes: No results for input(s): CKTOTAL, CKMB, CKMBINDEX, TROPONINI in the last 168 hours. BNP (last 3 results) No results for input(s): PROBNP in the last 8760 hours. HbA1C: No results for input(s): HGBA1C in the last 72 hours. CBG: No results for input(s): GLUCAP in the last 168 hours. Lipid Profile: No results for input(s): CHOL, HDL, LDLCALC, TRIG, CHOLHDL, LDLDIRECT in the last 72 hours. Thyroid Function Tests: No results for input(s): TSH, T4TOTAL, FREET4, T3FREE, THYROIDAB in the last 72 hours. Anemia Panel: No results for input(s): VITAMINB12, FOLATE, FERRITIN, TIBC, IRON, RETICCTPCT in the last 72 hours. Sepsis Labs: No results for input(s): PROCALCITON, LATICACIDVEN in the last 168 hours.  Recent Results (from the past 240 hour(s))  Resp Panel by RT-PCR (Flu A&B, Covid) Nasopharyngeal Swab     Status: None   Collection Time: 12/04/21 11:36 PM   Specimen: Nasopharyngeal Swab; Nasopharyngeal(NP) swabs in vial transport medium  Result Value Ref Range Status   SARS Coronavirus 2 by RT PCR NEGATIVE NEGATIVE Final    Comment: (NOTE) SARS-CoV-2 target nucleic acids are NOT DETECTED.  The SARS-CoV-2 RNA is generally detectable in upper respiratory specimens during the acute phase of infection. The lowest concentration of SARS-CoV-2 viral copies this assay can detect is 138 copies/mL. A negative result does not preclude SARS-Cov-2 infection and should not be used as the sole basis for treatment  or other patient management decisions. A negative result may occur with  improper specimen collection/handling, submission of specimen other than nasopharyngeal swab, presence of viral mutation(s) within the areas targeted by this assay, and inadequate number of viral copies(<138 copies/mL). A negative result must be combined with clinical observations, patient history, and epidemiological information. The expected result is  Negative.  Fact Sheet for Patients:  EntrepreneurPulse.com.au  Fact Sheet for Healthcare Providers:  IncredibleEmployment.be  This test is no t yet approved or cleared by the [place] FDA and  has been authorized for detection and/or diagnosis of SARS-CoV-2 by FDA under an Emergency Use Authorization (EUA). This EUA will remain  in effect (meaning this test can be used) for the duration of the COVID-19 declaration under Section 564(b)(1) of the Act, 21 U.S.C.section 360bbb-3(b)(1), unless the authorization is terminated  or revoked sooner.       Influenza A by PCR NEGATIVE NEGATIVE Final   Influenza B by PCR NEGATIVE NEGATIVE Final    Comment: (NOTE) The Xpert Xpress SARS-CoV-2/FLU/RSV plus assay is intended as an aid in the diagnosis of influenza from Nasopharyngeal swab specimens and should not be used as a sole basis for treatment. Nasal washings and aspirates are unacceptable for Xpert Xpress SARS-CoV-2/FLU/RSV testing.  Fact Sheet for Patients: EntrepreneurPulse.com.au  Fact Sheet for Healthcare Providers: IncredibleEmployment.be  This test is not yet approved or cleared by the [place] FDA and has been authorized for detection and/or diagnosis of SARS-CoV-2 by FDA under an Emergency Use Authorization (EUA). This EUA will remain in effect (meaning this test can be used) for the duration of the COVID-19 declaration under Section 564(b)(1) of the Act, 21 U.S.C. section  360bbb-3(b)(1), unless the authorization is terminated or revoked.  Performed at [facility], [address] [place], [place] [address]       Radiology Studies: [facility] Chest [place] 1 View  Result Date: 12/07/2021 CLINICAL DATA:  61 year old female with history of COPD. Right-sided chest pain. EXAM: PORTABLE CHEST 1 VIEW COMPARISON:  Chest x-ray 12/04/2021. FINDINGS: Opacity at the right lung base favored to reflect subsegmental atelectasis, although a focus of airspace consolidation is not excluded. Small right pleural effusion. Left lung is clear. No left pleural effusion. No pneumothorax. No evidence of pulmonary edema. Heart size is normal. Upper mediastinal contours are within normal limits. Atherosclerotic calcifications in the thoracic aorta. IMPRESSION: 1. Small right pleural effusion with atelectasis and/or consolidation in the right lung base. 2. Aortic atherosclerosis. Electronically Signed   By: Vinnie Langton M.D.   On: 12/07/2021 08:44     LOS: 3 days   Antonieta Pert, MD [facility]  12/08/2021, 11:45 AM

## 2021-12-09 LAB — BASIC METABOLIC PANEL
Anion gap: 8 (ref 5–15)
BUN: 12 mg/dL (ref 8–23)
CO2: 29 mmol/L (ref 22–32)
Calcium: 8.7 mg/dL — ABNORMAL LOW (ref 8.9–10.3)
Chloride: 93 mmol/L — ABNORMAL LOW (ref 98–111)
Creatinine, Ser: 0.5 mg/dL (ref 0.44–1.00)
GFR, Estimated: >60 mL/min (ref >60)
Glucose, Bld: 86 mg/dL (ref 70–99)
Potassium: 4.2 mmol/L (ref 3.5–5.1)
Sodium: 130 mmol/L — ABNORMAL LOW (ref 135–145)

## 2021-12-09 MED ORDER — PREDNISONE 20 MG PO TABS
20.0000 mg | ORAL_TABLET | Freq: Every day | ORAL | 0 refills | Status: DC
Start: 2021-12-10 — End: 2021-12-18

## 2021-12-09 MED ORDER — DOXYCYCLINE HYCLATE 100 MG PO TABS
100.0000 mg | ORAL_TABLET | Freq: Two times a day (BID) | ORAL | Status: DC
  Administered 2021-12-09 – 2021-12-10 (×2): 100 mg via ORAL
  Filled 2021-12-09 (×2): qty 1

## 2021-12-09 MED ORDER — KETOROLAC TROMETHAMINE 15 MG/ML IJ SOLN
15.0000 mg | Freq: Four times a day (QID) | INTRAMUSCULAR | Status: AC | PRN
  Administered 2021-12-09: 15 mg via INTRAVENOUS
  Filled 2021-12-09 (×2): qty 1

## 2021-12-09 MED ORDER — DOXYCYCLINE HYCLATE 100 MG PO TABS
100.0000 mg | ORAL_TABLET | Freq: Two times a day (BID) | ORAL | 0 refills | Status: DC
Start: 2021-12-09 — End: 2021-12-18

## 2021-12-09 MED ORDER — HYDROCORTISONE 1 % EX LOTN: TOPICAL_LOTION | Freq: Three times a day (TID) | CUTANEOUS | 0 refills | Status: DC | PRN

## 2021-12-09 MED ORDER — SODIUM CHLORIDE 1 G PO TABS: 1.0000 g | ORAL_TABLET | Freq: Two times a day (BID) | ORAL | 0 refills | Status: DC

## 2021-12-09 MED ORDER — PREDNISONE 20 MG PO TABS
20.0000 mg | ORAL_TABLET | Freq: Every day | ORAL | Status: DC
  Administered 2021-12-10: 20 mg via ORAL
  Filled 2021-12-09: qty 1

## 2021-12-09 MED ORDER — LIDOCAINE 5 % EX PTCH
1.0000 | MEDICATED_PATCH | CUTANEOUS | 0 refills | Status: DC
Start: 2021-12-09 — End: 2021-12-18

## 2021-12-09 MED ORDER — CYCLOBENZAPRINE HCL 5 MG PO TABS
5.0000 mg | ORAL_TABLET | Freq: Three times a day (TID) | ORAL | 0 refills | Status: DC | PRN
Start: 2021-12-09 — End: 2021-12-13

## 2021-12-09 NOTE — Progress Notes (Signed)
The patient is injury-free, afebrile, alert, and oriented X 3. Vital signs were within the baseline during this shift. She complained of back pain and anxiety and improved with PRN medications. Pt denies chest pain, SOB, nausea, vomiting, dizziness, signs or symptoms of bleeding or infection, or acute changes during this shift. We will continue to monitor and work toward achieving the care plan goals.

## 2021-12-09 NOTE — Plan of Care (Signed)
°  Problem: Education: Goal: Knowledge of disease or condition will improve Outcome: Progressing Goal: Knowledge of the prescribed therapeutic regimen will improve Outcome: Progressing Goal: Individualized Educational Video(s) Outcome: Progressing   Problem: Activity: Goal: Ability to tolerate increased activity will improve Outcome: Progressing Goal: Will verbalize the importance of balancing activity with adequate rest periods Outcome: Progressing   Problem: Respiratory: Goal: Ability to maintain a clear airway will improve Outcome: Progressing Goal: Levels of oxygenation will improve Outcome: Progressing Goal: Ability to maintain adequate ventilation will improve Outcome: Progressing   Problem: Education: Goal: Knowledge of General Education information will improve Description: Including pain rating scale, medication(s)/side effects and non-pharmacologic comfort measures Outcome: Progressing   Problem: Health Behavior/Discharge Planning: Goal: Ability to manage health-related needs will improve Outcome: Progressing   Problem: Clinical Measurements: Goal: Ability to maintain clinical measurements within normal limits will improve Outcome: Progressing Goal: Will remain free from infection Outcome: Progressing Goal: Diagnostic test results will improve Outcome: Progressing Goal: Respiratory complications will improve Outcome: Progressing Goal: Cardiovascular complication will be avoided Outcome: Progressing   Problem: Activity: Goal: Risk for activity intolerance will decrease Outcome: Progressing   Problem: Coping: Goal: Level of anxiety will decrease Outcome: Progressing   Problem: Elimination: Goal: Will not experience complications related to bowel motility Outcome: Progressing Goal: Will not experience complications related to urinary retention Outcome: Progressing   Problem: Pain Managment: Goal: General experience of comfort will improve Outcome:  Progressing   Problem: Safety: Goal: Ability to remain free from injury will improve Outcome: Progressing   Problem: Skin Integrity: Goal: Risk for impaired skin integrity will decrease Outcome: Progressing

## 2021-12-09 NOTE — Progress Notes (Signed)
PROGRESS NOTE    Colleen [name]  SEG:[id number] DOB: 1961-06-09 DOA: 12/04/2021 PCP: Remi Haggard, FNP   Chief Complaint  Patient presents with   Shortness of Breath   Back Pain  Brief Narrative/Hospital Course: Colleen [name], 61 y.o. female with PMH of asthma COPD chronic hypoxic aspiratory failure ongoing tobacco abuse, anxiety/depression chronic back pain history of osteoporosis/osteopenia presenting with dyspnea cough chest pain wheezing. She was seen in the ED was tachycardic tachypneic and wheezing COVID-19 negative chest x-ray COPD changes, patient is doing IV Toradol Solu-Medrol bronchodilators along with IV ketamine due to her back pain and patient was admitted for COPD exacerbation. Patient also having rash 2 to 3 days prior to admission in her feet. Patient rash was improving after starting to get worse.  She complains of ongoing anxiety issues.  Subjective:  Seen and examined this morning her rash is improving, complains of ongoing anxiety issues.  Has chronic back pain.   Not in distress nonfocal on lower extremities no bowel bladder incontinence   Assessment & Plan:  Acute COPD exacerbation: improved completing steroid taper, continue her bronchodilators as needed.  Continue  antitussives cont Tobacco cessation.   Chronic hypoxic respiratory failure-stable. continue her home oxygen 3l.  Stable   Pleuritic chest pain mostly on the right side now, chest x-ray showed small right pleural effusion with atelectasis and/or consolidation in the right lung base, doing well on oral doxycycline-completing the course, cont antitussives pain control.    Chronic low back pain/left buttock pain, SI joint arthritis pain: Followed by pain management clinic Dr Holley Raring. Patient reports she was getting Toradol and had received by her primary care doctor 3 days prior to admission.Neurontin dose was increased but she was refusing to take it due to rash-she reports she was taking it  sporadically at home.  Complains a lot of anxiety issues.  Showing improvement with Flexeril, continue IV fentanyl/Toradol for breakthrough pain while inpatient, continue her Valium.No neurological deficit in LE,she will need to follow-up with her pain clinic soon upon discharge.  She will continue on her outpatient regimen.Patient reports she was due for her injection back in November.  I have left messages to her pain specialist   Anxiety/depression:she has started seeing psychiatrist recently and placed on Valium Seroquel but has been refusing Seroquel here.  Continue Valium,muscle relaxant.  Patient to follow-up with her psychiatry, given ongoing anxiety issues psych has been consulted and awaiting for evaluation.   HLD continue her statin Hyponatremia does have chronic hyponatremia sodium now improved stop IV fluids continue salt tablets Erythematous rash on LE and UE: rash was present prior to admissions ?  Socks, unclear etiology- no new medications, patient reports she has tolerated Toradol very well and had gotten few days prior to admission from PCP, we will hold off azithromycin further dosing.  She has been sporadically taking Neurontin at home refusing to take here thinking it can be causing rash.  Not taking Seroquel and discontinued.  Rash seems to be improving well.  Continue supportive care Benadryl,hydrocortisone lotion  DVT prophylaxis: enoxaparin (LOVENOX) injection 40 mg Start: 12/05/21 0800 Code Status:   Code Status: Full Code Family Communication: plan of care discussed with patient at bedside. Status is: Inpatient Remains inpatient appropriate because: Ongoing management of COPD exacerbation and chronic back pain Disposition: Currently not medically stable for discharge. Anticipated Disposition: Anticipate home after psychiatric evaluation  Total time spent in the care of this patient 35 MINUTES Objective: Vitals last 24 hrs: Vitals:  12/09/21 0824 12/09/21 1155 12/09/21  1456 12/09/21 1649  BP:  115/81  127/80  Pulse:  98  85  Resp:  20  19  Temp:    98.6 F (37 C)  TempSrc:      SpO2: 95% 95% 95% 97%  Weight:      Height:       Weight change:   Intake/Output Summary (Last 24 hours) at 12/09/2021 1732 Last data filed at 12/09/2021 1020 Gross per 24 hour  Intake 740 ml  Output --  Net 740 ml    Net IO Since Admission: 3,807.17 mL [12/09/21 1732]   Physical Examination: General exam: AAOx 3, anxious older than stated age, weak appearing. HEENT:Oral mucosa moist, Ear/Nose WNL grossly, dentition normal. Respiratory system: bilaterally clear, no use of accessory muscle Cardiovascular system: S1 & S2 +, No JVD,. Gastrointestinal system: Abdomen soft,NT,ND, BS+ Nervous System:Alert, awake, moving extremities and grossly nonfocal Extremities: no edema, distal peripheral pulses palpable.  Skin: Restless in lower extremities - improving, no icterus. MSK: Normal muscle bulk,tone, power   Medications reviewed:  Scheduled Meds:  atorvastatin  40 mg Oral Daily   dextromethorphan-guaiFENesin  1 tablet Oral BID   doxycycline  100 mg Oral Q12H   enoxaparin (LOVENOX) injection  40 mg Subcutaneous Q24H   famotidine  20 mg Oral Daily   fluticasone  2 spray Each Nare Daily   ipratropium-albuterol  3 mL Nebulization TID   lidocaine  1 patch Transdermal Q24H   [START ON 12/10/2021] predniSONE  20 mg Oral Q breakfast   sodium chloride  1 g Oral BID WC   Continuous Infusions:  methocarbamol (ROBAXIN) IV Stopped (12/07/21 1450)   Diet Order             Diet regular Room service appropriate? Yes; Fluid consistency: Thin  Diet effective now                 Weight change:   Wt Readings from Last 3 Encounters:  12/04/21 60 kg  11/06/21 59.9 kg  10/19/21 65.1 kg     Consultants:see note  Procedures:see note Antimicrobials: Anti-infectives (From admission, onward)    Start     Dose/Rate Route Frequency Ordered Stop   12/09/21 2200  doxycycline  (VIBRA-TABS) tablet 100 mg        100 mg Oral Every 12 hours 12/09/21 1221 12/11/21 2159   12/09/21 0000  doxycycline (VIBRA-TABS) 100 MG tablet        100 mg Oral Every 12 hours 12/09/21 1131 12/13/21 2359   12/07/21 1030  azithromycin (ZITHROMAX) tablet 500 mg  Status:  Discontinued        500 mg Oral Daily 12/07/21 0936 12/07/21 0942   12/07/21 1030  doxycycline (VIBRA-TABS) tablet 100 mg  Status:  Discontinued        100 mg Oral Every 12 hours 12/07/21 0942 12/09/21 1221   12/06/21 0600  azithromycin (ZITHROMAX) tablet 500 mg  Status:  Discontinued       See Hyperspace for full Linked Orders Report.   500 mg Oral Daily 12/05/21 0145 12/06/21 1818   12/05/21 0215  azithromycin (ZITHROMAX) 500 mg in sodium chloride 0.9 % 250 mL IVPB       See Hyperspace for full Linked Orders Report.   500 mg 250 mL/hr over 60 Minutes Intravenous Every 24 hours 12/05/21 0145 12/05/21 0330      Culture/Microbiology    Component Value Date/Time   SDES BLOOD BLOOD RIGHT HAND 10/11/2018  2037   SPECREQUEST  10/11/2018 2037    BOTTLES DRAWN AEROBIC AND ANAEROBIC Blood Culture adequate volume   CULT  10/11/2018 2037    NO GROWTH 5 DAYS Performed at [facility], [address] [place], [place] [address]    REPTSTATUS 10/16/2018 FINAL 10/11/2018 2037    Other culture-see note  Unresulted Labs (From admission, onward)     Start     Ordered   12/08/21 6195  Basic metabolic panel  Daily,   R     Question:  Specimen collection method  Answer:  Lab=Lab collect   12/07/21 1148          Data Reviewed: I have personally reviewed following labs and imaging studies CBC: Recent Labs  Lab 12/04/21 2336 12/05/21 0643  WBC 7.3 5.8  NEUTROABS 5.8  --   HGB 14.8 14.1  HCT 44.6 41.5  MCV 89.6 88.9  PLT 322 093    Basic Metabolic Panel: Recent Labs  Lab 12/05/21 0643 12/07/21 1002 12/07/21 1904 12/08/21 0552 12/09/21 0629  NA 130* 126* 127* 132* 130*  K 5.0 4.4 4.6 4.2 4.2  CL  97* 92* 91* 95* 93*  CO2 26 27 28 28 29   GLUCOSE 141* 91 170* 87 86  BUN 6* 6* 8 6* 12  CREATININE 0.49 0.42* 0.50 0.46 0.50  CALCIUM 8.2* 8.2* 8.8* 8.7* 8.7*    GFR: Estimated Creatinine Clearance: 59.8 mL/min (by C-G formula based on SCr of 0.5 mg/dL). Liver Function Tests: Recent Labs  Lab 12/04/21 2336  AST 15  ALT 12  ALKPHOS 44  BILITOT 0.6  PROT 7.5  ALBUMIN 4.2    No results for input(s): LIPASE, AMYLASE in the last 168 hours. No results for input(s): AMMONIA in the last 168 hours. Coagulation Profile: No results for input(s): INR, PROTIME in the last 168 hours. Cardiac Enzymes: No results for input(s): CKTOTAL, CKMB, CKMBINDEX, TROPONINI in the last 168 hours. BNP (last 3 results) No results for input(s): PROBNP in the last 8760 hours. HbA1C: No results for input(s): HGBA1C in the last 72 hours. CBG: No results for input(s): GLUCAP in the last 168 hours. Lipid Profile: No results for input(s): CHOL, HDL, LDLCALC, TRIG, CHOLHDL, LDLDIRECT in the last 72 hours. Thyroid Function Tests: No results for input(s): TSH, T4TOTAL, FREET4, T3FREE, THYROIDAB in the last 72 hours. Anemia Panel: No results for input(s): VITAMINB12, FOLATE, FERRITIN, TIBC, IRON, RETICCTPCT in the last 72 hours. Sepsis Labs: No results for input(s): PROCALCITON, LATICACIDVEN in the last 168 hours.  Recent Results (from the past 240 hour(s))  Resp Panel by RT-PCR (Flu A&B, Covid) Nasopharyngeal Swab     Status: None   Collection Time: 12/04/21 11:36 PM   Specimen: Nasopharyngeal Swab; Nasopharyngeal(NP) swabs in vial transport medium  Result Value Ref Range Status   SARS Coronavirus 2 by RT PCR NEGATIVE NEGATIVE Final    Comment: (NOTE) SARS-CoV-2 target nucleic acids are NOT DETECTED.  The SARS-CoV-2 RNA is generally detectable in upper respiratory specimens during the acute phase of infection. The lowest concentration of SARS-CoV-2 viral copies this assay can detect is 138 copies/mL.  A negative result does not preclude SARS-Cov-2 infection and should not be used as the sole basis for treatment or other patient management decisions. A negative result may occur with  improper specimen collection/handling, submission of specimen other than nasopharyngeal swab, presence of viral mutation(s) within the areas targeted by this assay, and inadequate number of viral copies(<138 copies/mL). A negative result must  be combined with clinical observations, patient history, and epidemiological information. The expected result is Negative.  Fact Sheet for Patients:  EntrepreneurPulse.com.au  Fact Sheet for Healthcare Providers:  IncredibleEmployment.be  This test is no t yet approved or cleared by the [place] FDA and  has been authorized for detection and/or diagnosis of SARS-CoV-2 by FDA under an Emergency Use Authorization (EUA). This EUA will remain  in effect (meaning this test can be used) for the duration of the COVID-19 declaration under Section 564(b)(1) of the Act, 21 U.S.C.section 360bbb-3(b)(1), unless the authorization is terminated  or revoked sooner.       Influenza A by PCR NEGATIVE NEGATIVE Final   Influenza B by PCR NEGATIVE NEGATIVE Final    Comment: (NOTE) The Xpert Xpress SARS-CoV-2/FLU/RSV plus assay is intended as an aid in the diagnosis of influenza from Nasopharyngeal swab specimens and should not be used as a sole basis for treatment. Nasal washings and aspirates are unacceptable for Xpert Xpress SARS-CoV-2/FLU/RSV testing.  Fact Sheet for Patients: EntrepreneurPulse.com.au  Fact Sheet for Healthcare Providers: IncredibleEmployment.be  This test is not yet approved or cleared by the [place] FDA and has been authorized for detection and/or diagnosis of SARS-CoV-2 by FDA under an Emergency Use Authorization (EUA). This EUA will remain in effect (meaning this test  can be used) for the duration of the COVID-19 declaration under Section 564(b)(1) of the Act, 21 U.S.C. section 360bbb-3(b)(1), unless the authorization is terminated or revoked.  Performed at [facility], [address], [place], [place] [address]       Radiology Studies: No results found.   LOS: 4 days   Antonieta Pert, MD [facility]  12/09/2021, 5:32 PM

## 2021-12-09 NOTE — Discharge Summary (Signed)
Physician Discharge Summary  Opaline Reyburn YTK:[id number] DOB: 1961/05/14 DOA: 12/04/2021  PCP: Remi Haggard, FNP  Admit date: 12/04/2021 Discharge date: 12/10/2021  Admitted From: home Disposition:  home  Recommendations for Outpatient Follow-up:  Follow up with PCP in 1-2 weeks Follow-up with your pain specialist tomorrow as scheduled, follow-up with psychiatry Follow-up with your pain specialist in a day or 2  Home Health:no  Equipment/Devices: none  Discharge Condition: Stable Code Status:   Code Status: Full Code Diet recommendation:  Diet Order             Diet regular Room service appropriate? Yes; Fluid consistency: Thin  Diet effective now                   Brief/Interim Summary: 61 y.o. female with PMH of asthma COPD chronic hypoxic aspiratory failure ongoing tobacco abuse, anxiety/depression chronic back pain history of osteoporosis/osteopenia presenting with dyspnea cough chest pain wheezing. She was seen in the ED was tachycardic tachypneic and wheezing COVID-19 negative chest x-ray COPD changes, patient is doing IV Toradol Solu-Medrol bronchodilators along with IV ketamine due to her back pain and patient was admitted for COPD exacerbation. Patient also having rash 2 to 3 days prior to admission in her feet. Patient rash rash has not improved.  She had ongoing anxiety issues, requested to discuss with psychiatry who had seen the patient no suicidal ideation currently, not increasing Valium dose for now patient to continue home Valium diet and to discuss with patient's personal psychiatrist  She will continue on her home pain regimen, added muscle relaxant and she has appointment to see a pain specialist tomorrow.  No bowel bladder incontinence no focal neurological deficit on lower extremities no red flag signs.  She still for discharge home today  Discharge Diagnoses:   Acute COPD exacerbation: improved completing steroid taper, continue her bronchodilators as  needed. Continue  antitussives cont Tobacco cessation.   Chronic hypoxic respiratory failure-stable. continue her home oxygen 3l.  Stable   Pleuritic chest pain mostly on the right side now, chest x-ray showed small right pleural effusion with atelectasis and/or consolidation in the right lung base, doing well on oral doxycycline-completing the course, cont antitussives pain control.    Chronic low back pain/left buttock pain, SI joint arthritis pain: Followed by pain management clinic Dr Holley Raring. Patient reports she was getting Toradol and had received by her primary care doctor 3 days prior to admission.Neurontin dose was increased but she was refusing to take it due to rash-she reports she was taking it sporadically at home.  Complains a lot of anxiety issues.  Showing improvement with Flexeril-we will send prescription, continue her Valium.No neurological deficit in LE,she will need to follow-up with her pain clinic soon upon discharge-and has an appointment tomorrow.   Anxiety/depression:she has started seeing psychiatrist recently and placed on Valium Seroquel but has been refusing Seroquel here.  Continue Valium,muscle relaxant.  Was seen by psychiatry currently no SI no inpatient psychiatry needed and we will discharge her with instruction to follow-up with her psychiatrist  HLD continue her statin Hyponatremia does have chronic hyponatremia sodium now improved stop IV fluids continue salt tablets Erythematous rash on LE and UE: rash at this time is resolving, continue hydrocortisone cream steroid taper   Consults: [facility] Psychiatry  Subjective: Alert awake oriented resting comfortably able to speak well, moving lower extremities well sensation intact no bowel bladder incontinence   Discharge Exam: Vitals:   12/10/21 0738 12/10/21 1110  BP:  135/90 96/85  Pulse: 88 92  Resp: 19 19  Temp: 98.2 F (36.8 C) 98.3 F (36.8 C)  SpO2: 98% 94%   General: Pt is alert, awake, not in acute  distress Cardiovascular: RRR, S1/S2 +, no rubs, no gallops Respiratory: CTA bilaterally, no wheezing, no rhonchi Abdominal: Soft, NT, ND, bowel sounds + Extremities: no edema, no cyanosis  Discharge Instructions  Discharge Instructions     Discharge instructions   Complete by: As directed    Follow-up with your pain specialist tomorrow as scheduled, call your psychiatrist for further medication/plan regarding your anxiety disorder      Allergies as of 12/10/2021       Reactions   Desvenlafaxine Anaphylaxis   Morphine And Related Anaphylaxis   Penicillins Anaphylaxis   Has patient had a PCN reaction causing immediate rash, facial/tongue/throat swelling, SOB or lightheadedness with hypotension: Yes Has patient had a PCN reaction causing severe rash involving mucus membranes or skin necrosis: No Has patient had a PCN reaction that required hospitalization: Unknown Has patient had a PCN reaction occurring within the last 10 years: No If all of the above answers are "NO", then may proceed with Cephalosporin use.   Prazosin Other (See Comments)   Tamoxifen Anaphylaxis   Trazodone Anaphylaxis   Clonazepam    Codeine Swelling   Duloxetine Itching   Hydroxyzine Itching   Lorazepam    Patient feels this medications makes her to sedated and wants to avoid   Paroxetine Hcl Hives   Sulfa Antibiotics Itching   Cephalexin Rash        Medication List     TAKE these medications    acetaminophen 500 MG tablet Commonly known as: TYLENOL Take 500 mg by mouth every 6 (six) hours as needed for headache, fever, moderate pain or mild pain. What changed: Another medication with the same name was removed. Continue taking this medication, and follow the directions you see here.   atorvastatin 40 MG tablet Commonly known as: LIPITOR Take 1 tablet (40 mg total) by mouth daily.   Cholecalciferol 25 MCG (1000 UT) tablet Take by mouth. What changed: Another medication with the same name was  removed. Continue taking this medication, and follow the directions you see here.   cyclobenzaprine 5 MG tablet Commonly known as: FLEXERIL Take 1 tablet (5 mg total) by mouth 3 (three) times daily as needed for up to 15 doses for muscle spasms.   denosumab 60 MG/ML Sosy injection Commonly known as: PROLIA Inject 60 mg into the skin every 6 (six) months.   diazepam 2 MG tablet Commonly known as: Valium Take 1 tablet (2 mg total) by mouth daily as needed for anxiety. For severe panic symptoms   doxycycline 100 MG tablet Commonly known as: VIBRA-TABS Take 1 tablet (100 mg total) by mouth every 12 (twelve) hours for 4 days.   EPINEPHrine 0.3 mg/0.3 mL Soaj injection Commonly known as: EPI-PEN Inject 0.3 mg into the muscle as needed.   fluticasone 50 MCG/ACT nasal spray Commonly known as: FLONASE Place 2 sprays into both nostrils daily.   gabapentin 300 MG capsule Commonly known as: Neurontin Take 1-2 capsules (300-600 mg total) by mouth as directed. Take 1 capsule daily AM , 1 capsule at noon and 2 capsules daily at bedtime What changed: additional instructions   hydrocortisone 1 % lotion Apply topically 3 (three) times daily as needed for itching.   Icy Hot 5 % Ptch Generic drug: Menthol Apply 1 patch topically daily as  needed.   ipratropium 0.02 % nebulizer solution Commonly known as: ATROVENT Take by nebulization 4 (four) times daily. What changed: Another medication with the same name was removed. Continue taking this medication, and follow the directions you see here.   lidocaine 5 % Commonly known as: LIDODERM Place 1 patch onto the skin daily for 14 days. Remove & Discard patch within 12 hours or as directed by MD   predniSONE 20 MG tablet Commonly known as: DELTASONE Take 1 tablet (20 mg total) by mouth daily with breakfast for 3 days.   QUEtiapine 50 MG tablet Commonly known as: SEROquel Take 0.5 tablets (25 mg total) by mouth at bedtime.   sodium chloride  1 g tablet Take 1 tablet (1 g total) by mouth 2 (two) times daily with a meal for 7 days.   Trelegy Ellipta 100-62.5-25 MCG/ACT Aepb Generic drug: Fluticasone-Umeclidin-Vilant Inhale 1 puff into the lungs daily.   Ventolin HFA 108 (90 Base) MCG/ACT inhaler Generic drug: albuterol Inhale 1-2 puffs into the lungs every 4 (four) hours as needed for cough or wheezing.        Follow-up Information     Remi Haggard, FNP Follow up in 1 week(s).   Specialty: Family Medicine Why: Patient to make own follow up appt office closed at this time Contact information: [place] [address] [phone]         Gillis Santa, MD. Go on 12/11/2021.   Specialty: Pain Medicine Why: Francine Graven appt @ 2:40pm Contact information: [place] [address] [phone]         Ursula Alert, MD. Call in 1 day(s).   Specialty: Psychiatry Contact information: [place] [address] [phone]                Allergies  Allergen Reactions   Desvenlafaxine Anaphylaxis   Morphine And Related Anaphylaxis   Penicillins Anaphylaxis    Has patient had a PCN reaction causing immediate rash, facial/tongue/throat swelling, SOB or lightheadedness with hypotension: Yes Has patient had a PCN reaction causing severe rash involving mucus membranes or skin necrosis: No Has patient had a PCN reaction that required hospitalization: Unknown Has patient had a PCN reaction occurring within the last 10 years: No If all of the above answers are "NO", then may proceed with Cephalosporin use.    Prazosin Other (See Comments)   Tamoxifen Anaphylaxis   Trazodone Anaphylaxis   Clonazepam    Codeine Swelling   Duloxetine Itching   Hydroxyzine Itching   Lorazepam     Patient feels this medications makes her to sedated and wants to avoid   Paroxetine Hcl Hives   Sulfa Antibiotics Itching   Cephalexin Rash    The results of  significant diagnostics from this hospitalization (including imaging, microbiology, ancillary and laboratory) are listed below for reference.    Microbiology: Recent Results (from the past 240 hour(s))  Resp Panel by RT-PCR (Flu A&B, Covid) Nasopharyngeal Swab     Status: None   Collection Time: 12/04/21 11:36 PM   Specimen: Nasopharyngeal Swab; Nasopharyngeal(NP) swabs in vial transport medium  Result Value Ref Range Status   SARS Coronavirus 2 by RT PCR NEGATIVE NEGATIVE Final    Comment: (NOTE) SARS-CoV-2 target nucleic acids are NOT DETECTED.  The SARS-CoV-2 RNA is generally detectable in upper respiratory specimens during the acute phase of infection. The lowest concentration of SARS-CoV-2 viral copies this assay can detect is 138 copies/mL. A negative  result does not preclude SARS-Cov-2 infection and should not be used as the sole basis for treatment or other patient management decisions. A negative result may occur with  improper specimen collection/handling, submission of specimen other than nasopharyngeal swab, presence of viral mutation(s) within the areas targeted by this assay, and inadequate number of viral copies(<138 copies/mL). A negative result must be combined with clinical observations, patient history, and epidemiological information. The expected result is Negative.  Fact Sheet for Patients:  EntrepreneurPulse.com.au  Fact Sheet for Healthcare Providers:  IncredibleEmployment.be  This test is no t yet approved or cleared by the [place] FDA and  has been authorized for detection and/or diagnosis of SARS-CoV-2 by FDA under an Emergency Use Authorization (EUA). This EUA will remain  in effect (meaning this test can be used) for the duration of the COVID-19 declaration under Section 564(b)(1) of the Act, 21 U.S.C.section 360bbb-3(b)(1), unless the authorization is terminated  or revoked sooner.       Influenza A by PCR  NEGATIVE NEGATIVE Final   Influenza B by PCR NEGATIVE NEGATIVE Final    Comment: (NOTE) The Xpert Xpress SARS-CoV-2/FLU/RSV plus assay is intended as an aid in the diagnosis of influenza from Nasopharyngeal swab specimens and should not be used as a sole basis for treatment. Nasal washings and aspirates are unacceptable for Xpert Xpress SARS-CoV-2/FLU/RSV testing.  Fact Sheet for Patients: EntrepreneurPulse.com.au  Fact Sheet for Healthcare Providers: IncredibleEmployment.be  This test is not yet approved or cleared by the [place] FDA and has been authorized for detection and/or diagnosis of SARS-CoV-2 by FDA under an Emergency Use Authorization (EUA). This EUA will remain in effect (meaning this test can be used) for the duration of the COVID-19 declaration under Section 564(b)(1) of the Act, 21 U.S.C. section 360bbb-3(b)(1), unless the authorization is terminated or revoked.  Performed at [facility], [address] [place], [place] [address]     Procedures/Studies: [facility] Chest Port 1 View  Result Date: 12/07/2021 CLINICAL DATA:  62 year old female with history of COPD. Right-sided chest pain. EXAM: PORTABLE CHEST 1 VIEW COMPARISON:  Chest x-ray 12/04/2021. FINDINGS: Opacity at the right lung base favored to reflect subsegmental atelectasis, although a focus of airspace consolidation is not excluded. Small right pleural effusion. Left lung is clear. No left pleural effusion. No pneumothorax. No evidence of pulmonary edema. Heart size is normal. Upper mediastinal contours are within normal limits. Atherosclerotic calcifications in the thoracic aorta. IMPRESSION: 1. Small right pleural effusion with atelectasis and/or consolidation in the right lung base. 2. Aortic atherosclerosis. Electronically Signed   By: Vinnie Langton M.D.   On: 12/07/2021 08:44   DG Chest Port 1 View  Result Date: 12/04/2021 CLINICAL DATA:  Shortness of  breath, history of COPD EXAM: PORTABLE CHEST 1 VIEW COMPARISON:  11/04/2021 FINDINGS: Cardiac and mediastinal contours are within normal limits. No new focal pulmonary opacity. Changes related to COPD, including emphysema and bronchitic changes. No pleural effusion or pneumothorax. No acute osseous abnormality. IMPRESSION: COPD without additional acute cardiopulmonary process. Electronically Signed   By: Merilyn Baba M.D.   On: 12/04/2021 23:48    Labs: BNP (last 3 results) Recent Labs    10/19/21 1120 12/04/21 2336  BNP 9.3 55.2   Basic Metabolic Panel: Recent Labs  Lab 12/07/21 1002 12/07/21 1904 12/08/21 0552 12/09/21 0629 12/10/21 0633  NA 126* 127* 132* 130* 126*  K 4.4 4.6 4.2 4.2 4.5  CL 92* 91* 95* 93* 89*  CO2 27 28 28  29 30  GLUCOSE 91 170* 87 86 81  BUN 6* 8 6* 12 11  CREATININE 0.42* 0.50 0.46 0.50 0.57  CALCIUM 8.2* 8.8* 8.7* 8.7* 8.8*   Liver Function Tests: Recent Labs  Lab 12/04/21 2336  AST 15  ALT 12  ALKPHOS 44  BILITOT 0.6  PROT 7.5  ALBUMIN 4.2   No results for input(s): LIPASE, AMYLASE in the last 168 hours. No results for input(s): AMMONIA in the last 168 hours. CBC: Recent Labs  Lab 12/04/21 2336 12/05/21 0643  WBC 7.3 5.8  NEUTROABS 5.8  --   HGB 14.8 14.1  HCT 44.6 41.5  MCV 89.6 88.9  PLT 322 315   Cardiac Enzymes: No results for input(s): CKTOTAL, CKMB, CKMBINDEX, TROPONINI in the last 168 hours. BNP: Invalid input(s): POCBNP CBG: No results for input(s): GLUCAP in the last 168 hours. D-Dimer No results for input(s): DDIMER in the last 72 hours. Hgb A1c No results for input(s): HGBA1C in the last 72 hours. Lipid Profile No results for input(s): CHOL, HDL, LDLCALC, TRIG, CHOLHDL, LDLDIRECT in the last 72 hours. Thyroid function studies No results for input(s): TSH, T4TOTAL, T3FREE, THYROIDAB in the last 72 hours.  Invalid input(s): FREET3 Anemia work up No results for input(s): VITAMINB12, FOLATE, FERRITIN, TIBC, IRON,  RETICCTPCT in the last 72 hours. Urinalysis    Component Value Date/Time   COLORURINE YELLOW (A) 10/12/2018 0654   APPEARANCEUR CLEAR (A) 10/12/2018 0654   LABSPEC 1.015 10/12/2018 0654   PHURINE 6.0 10/12/2018 0654   GLUCOSEU 50 (A) 10/12/2018 0654   HGBUR NEGATIVE 10/12/2018 0654   BILIRUBINUR NEGATIVE 10/12/2018 0654   KETONESUR 20 (A) 10/12/2018 0654   PROTEINUR NEGATIVE 10/12/2018 0654   NITRITE NEGATIVE 10/12/2018 0654   LEUKOCYTESUR TRACE (A) 10/12/2018 0654   Sepsis Labs Invalid input(s): PROCALCITONIN,  WBC,  LACTICIDVEN Microbiology Recent Results (from the past 240 hour(s))  Resp Panel by RT-PCR (Flu A&B, Covid) Nasopharyngeal Swab     Status: None   Collection Time: 12/04/21 11:36 PM   Specimen: Nasopharyngeal Swab; Nasopharyngeal(NP) swabs in vial transport medium  Result Value Ref Range Status   SARS Coronavirus 2 by RT PCR NEGATIVE NEGATIVE Final    Comment: (NOTE) SARS-CoV-2 target nucleic acids are NOT DETECTED.  The SARS-CoV-2 RNA is generally detectable in upper respiratory specimens during the acute phase of infection. The lowest concentration of SARS-CoV-2 viral copies this assay can detect is 138 copies/mL. A negative result does not preclude SARS-Cov-2 infection and should not be used as the sole basis for treatment or other patient management decisions. A negative result may occur with  improper specimen collection/handling, submission of specimen other than nasopharyngeal swab, presence of viral mutation(s) within the areas targeted by this assay, and inadequate number of viral copies(<138 copies/mL). A negative result must be combined with clinical observations, patient history, and epidemiological information. The expected result is Negative.  Fact Sheet for Patients:  EntrepreneurPulse.com.au  Fact Sheet for Healthcare Providers:  IncredibleEmployment.be  This test is no t yet approved or cleared by the  [place] FDA and  has been authorized for detection and/or diagnosis of SARS-CoV-2 by FDA under an Emergency Use Authorization (EUA). This EUA will remain  in effect (meaning this test can be used) for the duration of the COVID-19 declaration under Section 564(b)(1) of the Act, 21 U.S.C.section 360bbb-3(b)(1), unless the authorization is terminated  or revoked sooner.       Influenza A by PCR NEGATIVE NEGATIVE Final  Influenza B by PCR NEGATIVE NEGATIVE Final    Comment: (NOTE) The Xpert Xpress SARS-CoV-2/FLU/RSV plus assay is intended as an aid in the diagnosis of influenza from Nasopharyngeal swab specimens and should not be used as a sole basis for treatment. Nasal washings and aspirates are unacceptable for Xpert Xpress SARS-CoV-2/FLU/RSV testing.  Fact Sheet for Patients: EntrepreneurPulse.com.au  Fact Sheet for Healthcare Providers: IncredibleEmployment.be  This test is not yet approved or cleared by the [place] FDA and has been authorized for detection and/or diagnosis of SARS-CoV-2 by FDA under an Emergency Use Authorization (EUA). This EUA will remain in effect (meaning this test can be used) for the duration of the COVID-19 declaration under Section 564(b)(1) of the Act, 21 U.S.C. section 360bbb-3(b)(1), unless the authorization is terminated or revoked.  Performed at [facility], [address] [place], [place] [address]      Time coordinating discharge: 25 minutes  SIGNED: Antonieta Pert, MD  [facility] 12/10/2021, 2:12 PM  If 7PM-7AM, please contact night-coverage [email or web address]

## 2021-12-10 DIAGNOSIS — F4323 Adjustment disorder with mixed anxiety and depressed mood: Secondary | ICD-10-CM | POA: Diagnosis present

## 2021-12-10 LAB — BASIC METABOLIC PANEL
Anion gap: 7 (ref 5–15)
BUN: 11 mg/dL (ref 8–23)
CO2: 30 mmol/L (ref 22–32)
Calcium: 8.8 mg/dL — ABNORMAL LOW (ref 8.9–10.3)
Chloride: 89 mmol/L — ABNORMAL LOW (ref 98–111)
Creatinine, Ser: 0.57 mg/dL (ref 0.44–1.00)
GFR, Estimated: >60 mL/min (ref >60)
Glucose, Bld: 81 mg/dL (ref 70–99)
Potassium: 4.5 mmol/L (ref 3.5–5.1)
Sodium: 126 mmol/L — ABNORMAL LOW (ref 135–145)

## 2021-12-10 NOTE — Consult Note (Signed)
[facility] Face-to-Face Psychiatry Consult   Reason for Consult:  anxiety Referring Physician:  Antonieta Pert Patient Identification: Colleen [name] MRN:  [id number] Principal Diagnosis: Adjustment disorder with mixed anxiety and depressed mood Diagnosis:  Principal Problem:   Adjustment disorder with mixed anxiety and depressed mood Active Problems:   Chronic pain syndrome   Total Time spent with patient: 1 hour  Subjective:  "The valium dose is too low." Colleen [name] is a 61 y.o. female patient admitted with chronic pain, dyspnea.   HPI:  Patient was seen and chart reviewed. Patient states that she wants an increase in her Valium dose, as it is too low.  She states that her outpatient psychiatrist, Dr. Shea Evans, started her on 2 mg twice to three times a day and it just is not enough.  She reports getting anxious, even during the night, waking up in a "panic."  Patient describes some depression surrounding her chronic illnesses and some discord at home with her husband.  However, she adamantly denies any suicidal thought or intent.  Denies any auditory or visual hallucinations or paranoia.  Patient does not appear to be responding to internal stimuli.  Patient does not appear particularly anxious at this time.  Speaks in linear sentences, coherent.  Discussed role of antidepressants and how they can aid in lessening pain and anxiety, and perhaps aided in smoking cessation and this is something she may explore with her outpatient psychiatrist.  She states she has talked about this with her outpatient psychiatrist and does not wish to introduce any new medications at this time because "I need to get all the medicines out of my system that I have reactions to."  Advised patient that I was not comfortable increasing her valium dose or making other medication changes, as she has an outpatient provider, but I will consult with my supervising physician for any other input.  Discussed case with Dr. Weber Cooks, who  agrees that patient should be referred to her outpatient psychiatrist for follow-up and medication management.  Patient indicated discharge from medical floor today.  She does not appear to be at imminent risk for self-harm or need psychiatric hospitalization.  Past Psychiatric History: Patient states that she attempted to cut her wrists, "but only a little" last year due to her chronic pain.  Patient reports seeing a therapist on a regular basis and has outpatient psychiatry.  Risk to Self:   Risk to Others:   Prior Inpatient Therapy:   Prior Outpatient Therapy:    Past Medical History:  Past Medical History:  Diagnosis Date   Anxiety    Asthma    Breast cancer ([facility]) 2004   left breast   Chronic back pain    COPD (chronic obstructive pulmonary disease) ([place])    Depression    Personal history of radiation therapy     Past Surgical History:  Procedure Laterality Date   BREAST BIOPSY Left 2004   positive   BREAST BIOPSY Right    neg   BREAST LUMPECTOMY Left 2004   positive   LEFT HEART CATH AND CORONARY ANGIOGRAPHY N/A 11/06/2021   Procedure: LEFT HEART CATH AND CORONARY ANGIOGRAPHY;  Surgeon: Corey Skains, MD;  Location: [facility];  Service: Cardiovascular;  Laterality: N/A;   MASTECTOMY Left    MASTECTOMY     NEPHRECTOMY Left    TUBAL LIGATION     Family History:  Family History  Problem Relation Age of Onset   Breast cancer Cousin  Breast cancer Cousin    Bipolar disorder Daughter    Drug abuse Daughter    Alcohol abuse Maternal Grandfather    Family Psychiatric  History: None reported Social History:  Social History   Substance and Sexual Activity  Alcohol Use Yes     Social History   Substance and Sexual Activity  Drug Use Never    Social History   Socioeconomic History   Marital status: Married    Spouse name: Not on file   Number of children: 2   Years of education: GED   Highest education level: Not on file  Occupational History    Not on file  Tobacco Use   Smoking status: Former    Packs/day: 0.50    Types: Cigarettes   Smokeless tobacco: Never   Tobacco comments:    Reports quit smoking 2 days ago- 11/19/2021  Vaping Use   Vaping Use: Never used  Substance and Sexual Activity   Alcohol use: Yes   Drug use: Never   Sexual activity: Not Currently  Other Topics Concern   Not on file  Social History Narrative   Not on file   Social Determinants of Health   Financial Resource Strain: Not on file  Food Insecurity: Not on file  Transportation Needs: Not on file  Physical Activity: Not on file  Stress: Not on file  Social Connections: Not on file   Additional Social History:    Allergies:   Allergies  Allergen Reactions   Desvenlafaxine Anaphylaxis   Morphine And Related Anaphylaxis   Penicillins Anaphylaxis    Has patient had a PCN reaction causing immediate rash, facial/tongue/throat swelling, SOB or lightheadedness with hypotension: Yes Has patient had a PCN reaction causing severe rash involving mucus membranes or skin necrosis: No Has patient had a PCN reaction that required hospitalization: Unknown Has patient had a PCN reaction occurring within the last 10 years: No If all of the above answers are "NO", then may proceed with Cephalosporin use.    Prazosin Other (See Comments)   Tamoxifen Anaphylaxis   Trazodone Anaphylaxis   Clonazepam    Codeine Swelling   Duloxetine Itching   Hydroxyzine Itching   Lorazepam     Patient feels this medications makes her to sedated and wants to avoid   Paroxetine Hcl Hives   Sulfa Antibiotics Itching   Cephalexin Rash    Labs:  Results for orders placed or performed during the hospital encounter of 12/04/21 (from the past 48 hour(s))  Basic metabolic panel     Status: Abnormal   Collection Time: 12/09/21  6:29 AM  Result Value Ref Range   Sodium 130 (L) 135 - 145 mmol/L   Potassium 4.2 3.5 - 5.1 mmol/L   Chloride 93 (L) 98 - 111 mmol/L   CO2  29 22 - 32 mmol/L   Glucose, Bld 86 70 - 99 mg/dL    Comment: Glucose reference range applies only to samples taken after fasting for at least 8 hours.   BUN 12 8 - 23 mg/dL   Creatinine, Ser 0.50 0.44 - 1.00 mg/dL   Calcium 8.7 (L) 8.9 - 10.3 mg/dL   GFR, Estimated >60 >60 mL/min    Comment: (NOTE) Calculated using the CKD-EPI Creatinine Equation (2021)    Anion gap 8 5 - 15    Comment: Performed at [facility], [address], [place], [place] [address]  Basic metabolic panel     Status: Abnormal   Collection Time: 12/10/21  6:33 AM  Result Value Ref Range   Sodium 126 (L) 135 - 145 mmol/L   Potassium 4.5 3.5 - 5.1 mmol/L   Chloride 89 (L) 98 - 111 mmol/L   CO2 30 22 - 32 mmol/L   Glucose, Bld 81 70 - 99 mg/dL    Comment: Glucose reference range applies only to samples taken after fasting for at least 8 hours.   BUN 11 8 - 23 mg/dL   Creatinine, Ser 0.57 0.44 - 1.00 mg/dL   Calcium 8.8 (L) 8.9 - 10.3 mg/dL   GFR, Estimated >60 >60 mL/min    Comment: (NOTE) Calculated using the CKD-EPI Creatinine Equation (2021)    Anion gap 7 5 - 15    Comment: Performed at [facility], [address] [place], [place] [address]    Current Facility-Administered Medications  Medication Dose Route Frequency Provider Last Rate Last Admin   acetaminophen (TYLENOL) tablet 650 mg  650 mg Oral Q6H PRN Mansy, Jan A, MD   650 mg at 12/09/21 2028   Or   acetaminophen (TYLENOL) suppository 650 mg  650 mg Rectal Q6H PRN Mansy, Jan A, MD       atorvastatin (LIPITOR) tablet 40 mg  40 mg Oral Daily Mansy, Jan A, MD       benzonatate (TESSALON) capsule 100 mg  100 mg Oral Q8H PRN Sharion Settler, NP   100 mg at 12/09/21 2027   cyclobenzaprine (FLEXERIL) tablet 5 mg  5 mg Oral TID PRN Antonieta Pert, MD   5 mg at 12/09/21 2027   dextromethorphan-guaiFENesin ([facility] DM) 30-600 MG per 12 hr tablet 1 tablet  1 tablet Oral BID Antonieta Pert, MD   1 tablet at 12/10/21 0923   diazepam (VALIUM)  tablet 2 mg  2 mg Oral Q8H PRN Antonieta Pert, MD   2 mg at 12/10/21 0738   diphenhydrAMINE (BENADRYL) injection 25 mg  25 mg Intravenous Q6H PRN Kc, Maren Beach, MD   25 mg at 12/10/21 0100   doxycycline (VIBRA-TABS) tablet 100 mg  100 mg Oral Q12H Kc, Ramesh, MD   100 mg at 12/10/21 0923   enoxaparin (LOVENOX) injection 40 mg  40 mg Subcutaneous Q24H Mansy, Jan A, MD   40 mg at 12/10/21 0739   EPINEPHrine (EPI-PEN) injection 0.3 mg  0.3 mg Intramuscular PRN Mansy, Jan A, MD       famotidine (PEPCID) tablet 20 mg  20 mg Oral Daily Kc, Ramesh, MD   20 mg at 12/10/21 0923   fentaNYL (SUBLIMAZE) injection 25 mcg  25 mcg Intravenous Q4H PRN Mansy, Jan A, MD   25 mcg at 12/10/21 0100   fluticasone (FLONASE) 50 MCG/ACT nasal spray 2 spray  2 spray Each Nare Daily Mansy, Jan A, MD   2 spray at 12/10/21 7741   hydrocortisone 1 % lotion   Topical TID PRN Kc, Maren Beach, MD       ipratropium-albuterol (DUONEB) 0.5-2.5 (3) MG/3ML nebulizer solution 3 mL  3 mL Nebulization TID Mansy, Jan A, MD   3 mL at 12/10/21 0744   lidocaine (LIDODERM) 5 % 1 patch  1 patch Transdermal Q24H Mansy, Jan A, MD   1 patch at 12/10/21 0101   magnesium hydroxide (MILK OF MAGNESIA) suspension 30 mL  30 mL Oral Daily PRN Mansy, Jan A, MD       methocarbamol (ROBAXIN) 500 mg in dextrose 5 % 50 mL IVPB  500 mg Intravenous Q6H PRN Antonieta Pert, MD   Stopped  at 12/07/21 1450   ondansetron (ZOFRAN) tablet 4 mg  4 mg Oral Q6H PRN Mansy, Jan A, MD       Or   ondansetron [facility]) injection 4 mg  4 mg Intravenous Q6H PRN Mansy, Jan A, MD   4 mg at 12/10/21 0315   predniSONE (DELTASONE) tablet 20 mg  20 mg Oral Q breakfast Kc, Ramesh, MD   20 mg at 12/10/21 1749   sodium chloride tablet 1 g  1 g Oral BID WC Kc, Maren Beach, MD   1 g at 12/10/21 0740    Musculoskeletal: Strength & Muscle Tone: within normal limits Gait & Station:  Observed standing; not observed walking Patient leans: N/A    Psychiatric Specialty Exam:  Presentation  General  Appearance: Appropriate for Environment  Eye Contact:Good  Speech:Clear and Coherent  Speech Volume:Normal  Handedness:Right   Mood and Affect  Mood:Anxious (Stated)  Affect:Appropriate   Thought Process  Thought Processes:Coherent  Descriptions of Associations:Intact  Orientation:Full (Time, Place and Person)  Thought Content:WDL  History of Schizophrenia/Schizoaffective disorder:No  Duration of Psychotic Symptoms:No data recorded Hallucinations:Hallucinations: None  Ideas of Reference:None  Suicidal Thoughts:Suicidal Thoughts: No  Homicidal Thoughts:Homicidal Thoughts: No   Sensorium  Memory:Immediate Good  Judgment:Fair  Insight:Fair   Executive Functions  Concentration:Good  Attention Span:Good  [place] of Knowledge:Good  Language:Good   Psychomotor Activity  Psychomotor Activity:Psychomotor Activity: Normal   Assets  Assets:Communication Skills; Resilience; Financial Resources/Insurance; Housing; Intimacy   Sleep  Sleep:Sleep: Fair   Physical Exam: Physical Exam Vitals and nursing note reviewed.  HENT:     Head: Normocephalic.  Musculoskeletal:     Cervical back: Normal range of motion.  Skin:    General: Skin is dry.  Neurological:     Mental Status: She is alert and oriented to person, place, and time.  Psychiatric:        Attention and Perception: Attention normal.        Mood and Affect: Mood normal.        Speech: Speech normal.        Behavior: Behavior normal.        Thought Content: Thought content normal. Thought content is not paranoid. Thought content does not include homicidal or suicidal ideation.        Cognition and Memory: Cognition normal.        Judgment: Judgment normal.   Review of Systems  Psychiatric/Behavioral:  Positive for depression (Stable). Negative for hallucinations, memory loss and suicidal ideas. The patient is not nervous/anxious and does not have insomnia.   All other systems  reviewed and are negative. Blood pressure 96/85, pulse 92, temperature 98.3 F (36.8 C), temperature source Oral, resp. rate 19, height 5' (1.524 m), weight 60 kg, SpO2 94 %. Body mass index is 25.83 kg/m.  Treatment Plan Summary: 61 year old female hospitalized with above-stated medical diagnoses, planned discharge for today, psychiatry consulted for patient's anxiety.  Patient is to follow-up with outpatient psychiatry, no medication changes. Reviewed with Dr. Weber Cooks.  Disposition: No evidence of imminent risk to self or others at present.   Patient does not meet criteria for psychiatric inpatient admission. Supportive therapy provided about ongoing stressors. Discussed crisis plan, support from social network, calling 911, coming to the Emergency Department, and calling Suicide Hotline.  Sherlon Handing, NP 12/10/2021 1:01 PM

## 2021-12-11 ENCOUNTER — Encounter: Payer: Self-pay | Admitting: Student in an Organized Health Care Education/Training Program

## 2021-12-11 ENCOUNTER — Other Ambulatory Visit: Payer: Self-pay

## 2021-12-11 ENCOUNTER — Other Ambulatory Visit: Payer: Self-pay | Admitting: Psychiatry

## 2021-12-11 ENCOUNTER — Ambulatory Visit
Attending: Student in an Organized Health Care Education/Training Program | Admitting: Student in an Organized Health Care Education/Training Program

## 2021-12-11 DIAGNOSIS — M7918 Myalgia, other site: Secondary | ICD-10-CM | POA: Diagnosis not present

## 2021-12-11 DIAGNOSIS — M47812 Spondylosis without myelopathy or radiculopathy, cervical region: Secondary | ICD-10-CM | POA: Diagnosis not present

## 2021-12-11 DIAGNOSIS — G894 Chronic pain syndrome: Secondary | ICD-10-CM

## 2021-12-11 DIAGNOSIS — G4701 Insomnia due to medical condition: Secondary | ICD-10-CM

## 2021-12-11 DIAGNOSIS — F4310 Post-traumatic stress disorder, unspecified: Secondary | ICD-10-CM

## 2021-12-11 DIAGNOSIS — F411 Generalized anxiety disorder: Secondary | ICD-10-CM

## 2021-12-11 MED ORDER — DULOXETINE HCL 20 MG PO CPEP: 20.0000 mg | ORAL_CAPSULE | Freq: Every day | ORAL | 1 refills | Status: DC

## 2021-12-11 NOTE — Progress Notes (Signed)
Patient: Colleen [name]  Service Category: E/M  Provider: Gillis Santa, MD  DOB: [birth year]  DOS: 12/11/2021  Location: Office  MRN: [id number]  Setting: Ambulatory outpatient  Referring Provider: Remi Haggard, FNP  Type: Established Patient  Specialty: Interventional Pain Management  PCP: Remi Haggard, FNP  Location: Home  Delivery: TeleHealth     Virtual Encounter - Pain Management PROVIDER NOTE: Information contained herein reflects review and annotations entered in association with encounter. Interpretation of such information and data should be left to medically-trained personnel. Information provided to patient can be located elsewhere in the medical record under "Patient Instructions". Document created using STT-dictation technology, any transcriptional errors that may result from process are unintentional.    Contact & Pharmacy Preferred: [phone] Home: [phone] (home) Mobile: [phone] (mobile) E-mail: [email or web address]  [id number] [id number]: [phone]: [phone]  Pre-screening  Ms. Sendejo offered "in-person" vs "virtual" encounter. She indicated preferring virtual for this encounter.   Reason COVID-19*   Social distancing based on CDC and AMA recommendations.   I contacted VDickie Labarreon 12/11/2021 via telephone.      I clearly identified myself as BGillis Santa MD. I verified that I was speaking with the correct person using two identifiers (Name: VCatharine Kettlewell and date of birth: [birth year].  Consent I sought verbal advanced consent from VGreig Rightfor virtual visit interactions. I informed Ms. Donegan of possible security and privacy concerns, risks, and limitations associated with providing "not-in-person" medical evaluation and management services. I also informed Ms. Mahl of the availability of "in-person" appointments. Finally, I informed her that there would be a  charge for the virtual visit and that she could be  personally, fully or partially, financially responsible for it. Ms. MKvammeexpressed understanding and agreed to proceed.   Historic Elements   Ms. VLujain Kraszewskiis a 61y.o. year old, female patient evaluated today after our last contact on 10/08/2021. Ms. MHochberg has a past medical history of Anxiety, Asthma, [place] ([place]), Chronic back pain, COPD [place] obstructive [place] disease) [place], Depression, and Personal history of radiation therapy. She also  has a past surgical history that includes Nephrectomy (Left); Mastectomy (Left); Tubal ligation; Mastectomy; Breast biopsy (Left, 2004); Breast biopsy (Right); Breast lumpectomy (Left, 2004); and LEFT HEART CATH AND CORONARY ANGIOGRAPHY (N/A, 11/06/2021). Ms. MHoskiehas a current medication list which includes the following prescription(s): acetaminophen, cyclobenzaprine, denosumab, doxycycline, duloxetine, epinephrine, fluticasone, trelegy ellipta, hydrocortisone, ipratropium, ketorolac, lidocaine, prednisone, sodium chloride, ventolin hfa, and diazepam. She  reports that she has quit smoking. Her smoking use included cigarettes. She smoked an average of .5 packs per day. She has never used smokeless tobacco. She reports current alcohol use. She reports that she does not use drugs. Ms. MMcconahyis allergic to desvenlafaxine, morphine and related, penicillins, prazosin, tamoxifen, trazodone, clonazepam, codeine, duloxetine, gabapentin, hydroxyzine, lorazepam, paroxetine hcl, sulfa antibiotics, and cephalexin.   HPI  Today, she is being contacted for worsening of previously known (established) problem  Patient states that she was discharged from the hospital for anginal symptoms with elevated troponin.  She had a cardiac cath done on 12/28 with minimal atherosclerosis. Prior to her inpatient hospitalization, the plan was to proceed with diagnostic cervical facet medial branch nerve blocks  for her cervical spine pain related to cervical facet arthropathy.  She is calling stating that she would like to move forward with these facet blocks.  She has difficulty with cervical rotation.  She is tried physical therapy in the past for her neck which was not helpful.  She has tried gabapentin and Lyrica in the past which was not helpful.  Recommend trial of Cymbalta as below.  Laboratory Chemistry Profile   Renal Lab Results  Component Value Date   BUN 11 12/10/2021   CREATININE 0.57 12/10/2021   GFRAA >60 03/21/2020   GFRNONAA >60 12/10/2021    Hepatic Lab Results  Component Value Date   AST 15 12/04/2021   ALT 12 12/04/2021   ALBUMIN 4.2 12/04/2021   ALKPHOS 44 12/04/2021   LIPASE 24 10/16/2020    Electrolytes Lab Results  Component Value Date   NA 126 (L) 12/10/2021   K 4.5 12/10/2021   CL 89 (L) 12/10/2021   CALCIUM 8.8 (L) 12/10/2021   MG 2.0 10/21/2021    Bone No results found for: VD25OH, [id number], OE7035KK9, FG1829HB7, 25OHVITD1, 25OHVITD2, 25OHVITD3, TESTOFREE, TESTOSTERONE  Inflammation (CRP: Acute Phase) (ESR: Chronic Phase) Lab Results  Component Value Date   LATICACIDVEN 0.78 10/11/2018         Note: Above Lab results reviewed.  Imaging  DG Chest Port 1 View CLINICAL DATA:  61 year old female with history of COPD. Right-sided chest pain.  EXAM: PORTABLE CHEST 1 VIEW  COMPARISON:  Chest x-ray 12/04/2021.  FINDINGS: Opacity at the right lung base favored to reflect subsegmental atelectasis, although a focus of airspace consolidation is not excluded. Small right pleural effusion. Left lung is clear. No left pleural effusion. No pneumothorax. No evidence of pulmonary edema. Heart size is normal. Upper mediastinal contours are within normal limits. Atherosclerotic calcifications in the thoracic aorta.  IMPRESSION: 1. Small right pleural effusion with atelectasis and/or consolidation in the right lung base. 2. Aortic  atherosclerosis.  Electronically Signed   By: Vinnie Langton M.D.   On: 12/07/2021 08:44  CLINICAL DATA:  Cervical radiculopathy, no red flags bilateral UE weakness/parasthesias, EMG negative   EXAM: MRI CERVICAL SPINE WITHOUT CONTRAST   TECHNIQUE: Multiplanar, multisequence MR imaging of the cervical spine was performed. No intravenous contrast was administered.   COMPARISON:  June 02, 2020.   FINDINGS: Alignment: Slight anterolisthesis of C3 on C4. Straightening of the normal cervical lordosis.   Vertebrae: Vertebral body heights are maintained. No focal marrow edema to suggest acute fracture or discitis/osteomyelitis.   Cord: Normal cord signal.   Posterior Fossa, vertebral arteries, paraspinal tissues: Visualized vertebral artery flow voids are maintained. No evidence of acute abnormality in the visualized posterior fossa. No appreciable paraspinal edema.   Disc levels:   C2-C3: Small posterior disc osteophyte complex and bilateral facet/uncovertebral hypertrophy. No significant canal or foraminal stenosis.   C3-C4: Posterior disc osteophyte complex, slightly eccentric to the left. Bilateral facet and uncovertebral hypertrophy. Similar mild left foraminal stenosis without significant canal or right foraminal stenosis.   C4-C5: Posterior disc osteophyte complex with right greater than left facet and uncovertebral hypertrophy. Mild to moderate bilateral foraminal stenosis, progressed. No significant canal stenosis.   C5-C6: Small posterior disc osteophyte complex. No significant canal or foraminal stenosis.   C6-C7: Right greater than left facet and uncovertebral hypertrophy with mild right foraminal stenosis, progressed. No significant canal stenosis.   C7-T1: No significant disc protrusion, foraminal stenosis, or canal stenosis.   IMPRESSION: 1. At C4-C5, progressive mild to moderate bilateral foraminal stenosis. 2. At C6-C7, progressive mild right  foraminal stenosis. 3. At C3-C4, similar mild left foraminal stenosis.  Assessment  The primary encounter  diagnosis was Cervical facet joint syndrome (C3/4, C4/5). Diagnoses of Cervical myofascial pain syndrome and Chronic pain syndrome were also pertinent to this visit.  Plan of Care    Ms. Codie Hainer has a current medication list which includes the following long-term medication(s): duloxetine, fluticasone, ipratropium, and ventolin hfa.  Cervical facet joint syndrome (C3/4, C4/5) -Patient is having increased neck pain as well as interscapular pain.   -For her neck pain, patient is status post bilateral C3, C4, C5 cervical facet medial branch nerve block 07/25/2020.  We discussed repeating this for her neck pain.  Risks and benefits reviewed and patient like to proceed. - CERVICAL FACET (MEDIAL BRANCH NERVE BLOCK) ; Future    Pharmacotherapy (Medications Ordered): Meds ordered this encounter  Medications   DULoxetine (CYMBALTA) 20 MG capsule    Sig: Take 1 capsule (20 mg total) by mouth daily.    Dispense:  30 capsule    Refill:  1   Orders:  Orders Placed This Encounter  Procedures   CERVICAL FACET (MEDIAL BRANCH NERVE BLOCK)     Standing Status:   Future    Standing Expiration Date:   01/08/2022    Scheduling Instructions:     Side: Bilateral     Level: C3-4, C4-5, Facet joints (C3, C4, C5, Medial Branch Nerves)     Sedation: without     Timeframe: As soon as schedule allows    Order Specific Question:   Where will this procedure be performed?    Answer:   [facility]   Follow-up plan:   Return in about 1 week (around 12/18/2021) for C3-C5 MBNB, in clinic NS.     Status post right L4-L5 ESI #1 on 06/11/2020, #2 10/22/20, bilateral C3, C4, C5 cervical facet medial branch nerve block 07/25/2020 with thoracic TPI: only helped for 24 hrs          Recent Visits Date Type Provider Dept  09/23/21 Office Visit Gillis Santa, MD [facility]  Showing recent  visits within past 90 days and meeting all other requirements Today's Visits Date Type Provider Dept  12/11/21 Office Visit Gillis Santa, MD [facility]  Showing today's visits and meeting all other requirements Future Appointments No visits were found meeting these conditions. Showing future appointments within next 90 days and meeting all other requirements  I discussed the assessment and treatment plan with the patient. The patient was provided an opportunity to ask questions and all were answered. The patient agreed with the plan and demonstrated an understanding of the instructions.  Patient advised to call back or seek an in-person evaluation if the symptoms or condition worsens.  Duration of encounter: 32mnutes.  Note by: BGillis Santa MD Date: 12/11/2021; Time: 3:33 PM

## 2021-12-12 NOTE — Patient Instructions (Signed)

## 2021-12-13 ENCOUNTER — Encounter: Payer: Self-pay | Admitting: Psychiatry

## 2021-12-13 ENCOUNTER — Emergency Department
Admission: EM | Admit: 2021-12-13 | Discharge: 2021-12-13 | Disposition: A | Discharge status: Other Acute Inpatient Hospital | Attending: Emergency Medicine | Admitting: Emergency Medicine

## 2021-12-13 ENCOUNTER — Inpatient Hospital Stay
Admission: AD | Admit: 2021-12-13 | Discharge: 2021-12-18 | DRG: 885 | Disposition: A | Discharge status: Home / Self Care | Source: Intra-hospital | Attending: Psychiatry | Admitting: Psychiatry

## 2021-12-13 ENCOUNTER — Other Ambulatory Visit: Payer: Self-pay

## 2021-12-13 ENCOUNTER — Encounter: Payer: Self-pay | Admitting: Intensive Care

## 2021-12-13 ENCOUNTER — Telehealth (INDEPENDENT_AMBULATORY_CARE_PROVIDER_SITE_OTHER): Admitting: Psychiatry

## 2021-12-13 DIAGNOSIS — Z20822 Contact with and (suspected) exposure to covid-19: Secondary | ICD-10-CM | POA: Diagnosis present

## 2021-12-13 DIAGNOSIS — F32A Depression, unspecified: Secondary | ICD-10-CM

## 2021-12-13 DIAGNOSIS — F17210 Nicotine dependence, cigarettes, uncomplicated: Secondary | ICD-10-CM | POA: Diagnosis present

## 2021-12-13 DIAGNOSIS — G8929 Other chronic pain: Secondary | ICD-10-CM | POA: Insufficient documentation

## 2021-12-13 DIAGNOSIS — G4701 Insomnia due to medical condition: Secondary | ICD-10-CM | POA: Diagnosis present

## 2021-12-13 DIAGNOSIS — Z853 Personal history of malignant neoplasm of breast: Secondary | ICD-10-CM

## 2021-12-13 DIAGNOSIS — M810 Age-related osteoporosis without current pathological fracture: Secondary | ICD-10-CM | POA: Diagnosis present

## 2021-12-13 DIAGNOSIS — Z880 Allergy status to penicillin: Secondary | ICD-10-CM

## 2021-12-13 DIAGNOSIS — F411 Generalized anxiety disorder: Secondary | ICD-10-CM

## 2021-12-13 DIAGNOSIS — F4310 Post-traumatic stress disorder, unspecified: Secondary | ICD-10-CM

## 2021-12-13 DIAGNOSIS — F332 Major depressive disorder, recurrent severe without psychotic features: Principal | ICD-10-CM | POA: Diagnosis present

## 2021-12-13 DIAGNOSIS — Z9189 Other specified personal risk factors, not elsewhere classified: Secondary | ICD-10-CM

## 2021-12-13 DIAGNOSIS — J441 Chronic obstructive pulmonary disease with (acute) exacerbation: Secondary | ICD-10-CM | POA: Insufficient documentation

## 2021-12-13 DIAGNOSIS — J449 Chronic obstructive pulmonary disease, unspecified: Secondary | ICD-10-CM | POA: Diagnosis present

## 2021-12-13 DIAGNOSIS — Z9151 Personal history of suicidal behavior: Secondary | ICD-10-CM

## 2021-12-13 DIAGNOSIS — R45851 Suicidal ideations: Secondary | ICD-10-CM | POA: Diagnosis present

## 2021-12-13 DIAGNOSIS — M549 Dorsalgia, unspecified: Secondary | ICD-10-CM | POA: Diagnosis present

## 2021-12-13 DIAGNOSIS — M545 Low back pain, unspecified: Secondary | ICD-10-CM | POA: Diagnosis present

## 2021-12-13 DIAGNOSIS — F330 Major depressive disorder, recurrent, mild: Secondary | ICD-10-CM | POA: Insufficient documentation

## 2021-12-13 DIAGNOSIS — Z923 Personal history of irradiation: Secondary | ICD-10-CM

## 2021-12-13 DIAGNOSIS — F17200 Nicotine dependence, unspecified, uncomplicated: Secondary | ICD-10-CM

## 2021-12-13 LAB — URINE DRUG SCREEN, QUALITATIVE (ARMC ONLY)
Amphetamines, Ur Screen: NOT DETECTED
Barbiturates, Ur Screen: NOT DETECTED
Benzodiazepine, Ur Scrn: POSITIVE — AB
Cannabinoid 50 Ng, Ur ~~LOC~~: NOT DETECTED
Cocaine Metabolite,Ur ~~LOC~~: NOT DETECTED
MDMA (Ecstasy)Ur Screen: NOT DETECTED
Methadone Scn, Ur: NOT DETECTED
Opiate, Ur Screen: NOT DETECTED
Phencyclidine (PCP) Ur S: NOT DETECTED
Tricyclic, Ur Screen: NOT DETECTED

## 2021-12-13 LAB — COMPREHENSIVE METABOLIC PANEL
ALT: 14 U/L (ref 0–44)
AST: 17 U/L (ref 15–41)
Albumin: 4.3 g/dL (ref 3.5–5.0)
Alkaline Phosphatase: 40 U/L (ref 38–126)
Anion gap: 8 (ref 5–15)
BUN: 6 mg/dL — ABNORMAL LOW (ref 8–23)
CO2: 28 mmol/L (ref 22–32)
Calcium: 8.9 mg/dL (ref 8.9–10.3)
Chloride: 90 mmol/L — ABNORMAL LOW (ref 98–111)
Creatinine, Ser: 0.52 mg/dL (ref 0.44–1.00)
GFR, Estimated: >60 mL/min (ref >60)
Glucose, Bld: 108 mg/dL — ABNORMAL HIGH (ref 70–99)
Potassium: 4.4 mmol/L (ref 3.5–5.1)
Sodium: 126 mmol/L — ABNORMAL LOW (ref 135–145)
Total Bilirubin: 0.6 mg/dL (ref 0.3–1.2)
Total Protein: 7.2 g/dL (ref 6.5–8.1)

## 2021-12-13 LAB — CBC WITH DIFFERENTIAL/PLATELET
Abs Immature Granulocytes: 0.06 10*3/uL (ref 0.00–0.07)
Basophils Absolute: 0.1 10*3/uL (ref 0.0–0.1)
Basophils Relative: 1 %
Eosinophils Absolute: 0.2 10*3/uL (ref 0.0–0.5)
Eosinophils Relative: 2 %
HCT: 43 % (ref 36.0–46.0)
Hemoglobin: 14.1 g/dL (ref 12.0–15.0)
Immature Granulocytes: 1 %
Lymphocytes Relative: 14 %
Lymphs Abs: 1.5 10*3/uL (ref 0.7–4.0)
MCH: 29.6 pg (ref 26.0–34.0)
MCHC: 32.8 g/dL (ref 30.0–36.0)
MCV: 90.3 fL (ref 80.0–100.0)
Monocytes Absolute: 0.9 10*3/uL (ref 0.1–1.0)
Monocytes Relative: 8 %
Neutro Abs: 7.7 10*3/uL (ref 1.7–7.7)
Neutrophils Relative %: 74 %
Platelets: 323 10*3/uL (ref 150–400)
RBC: 4.76 MIL/uL (ref 3.87–5.11)
RDW: 14.2 % (ref 11.5–15.5)
WBC: 10.3 10*3/uL (ref 4.0–10.5)
nRBC: 0 % (ref 0.0–0.2)

## 2021-12-13 LAB — RESP PANEL BY RT-PCR (FLU A&B, COVID) ARPGX2
Influenza A by PCR: NEGATIVE
Influenza B by PCR: NEGATIVE
SARS Coronavirus 2 by RT PCR: NEGATIVE

## 2021-12-13 LAB — ACETAMINOPHEN LEVEL: Acetaminophen (Tylenol), Serum: <10 ug/mL — ABNORMAL LOW (ref 10–30)

## 2021-12-13 LAB — TROPONIN I (HIGH SENSITIVITY)
Troponin I (High Sensitivity): 6 ng/L (ref <18)
Troponin I (High Sensitivity): 6 ng/L (ref <18)

## 2021-12-13 LAB — ETHANOL: Alcohol, Ethyl (B): <10 mg/dL (ref <10)

## 2021-12-13 LAB — SALICYLATE LEVEL: Salicylate Lvl: <7 mg/dL — ABNORMAL LOW (ref 7.0–30.0)

## 2021-12-13 MED ORDER — SODIUM CHLORIDE 0.9 % IV BOLUS
1000.0000 mL | Freq: Once | INTRAVENOUS | Status: AC
  Administered 2021-12-13: 1000 mL via INTRAVENOUS

## 2021-12-13 MED ORDER — DIAZEPAM 2 MG PO TABS
2.0000 mg | ORAL_TABLET | Freq: Every day | ORAL | Status: DC | PRN
  Administered 2021-12-14 – 2021-12-17 (×4): 2 mg via ORAL
  Filled 2021-12-13 (×9): qty 1

## 2021-12-13 MED ORDER — QUETIAPINE FUMARATE 100 MG PO TABS
100.0000 mg | ORAL_TABLET | Freq: Every evening | ORAL | Status: DC | PRN
  Administered 2021-12-14 – 2021-12-15 (×2): 100 mg via ORAL
  Filled 2021-12-13 (×2): qty 1

## 2021-12-13 MED ORDER — SODIUM CHLORIDE 1 G PO TABS
1.0000 g | ORAL_TABLET | Freq: Two times a day (BID) | ORAL | Status: DC
  Administered 2021-12-13 – 2021-12-18 (×10): 1 g via ORAL
  Filled 2021-12-13 (×11): qty 1

## 2021-12-13 MED ORDER — ALUM & MAG HYDROXIDE-SIMETH 200-200-20 MG/5ML PO SUSP: 30.0000 mL | ORAL | Status: DC | PRN

## 2021-12-13 MED ORDER — CYCLOBENZAPRINE HCL 10 MG PO TABS
10.0000 mg | ORAL_TABLET | Freq: Once | ORAL | Status: AC
  Administered 2021-12-13: 10 mg via ORAL
  Filled 2021-12-13: qty 1

## 2021-12-13 MED ORDER — DOXYCYCLINE HYCLATE 100 MG PO TABS
100.0000 mg | ORAL_TABLET | Freq: Two times a day (BID) | ORAL | Status: DC
  Administered 2021-12-13 – 2021-12-18 (×10): 100 mg via ORAL
  Filled 2021-12-13 (×11): qty 1

## 2021-12-13 MED ORDER — KETAMINE HCL 50 MG/5ML IJ SOSY
0.3000 mg/kg | PREFILLED_SYRINGE | Freq: Once | INTRAMUSCULAR | Status: AC
  Administered 2021-12-13: 17 mg via INTRAVENOUS
  Filled 2021-12-13: qty 5

## 2021-12-13 MED ORDER — ALBUTEROL SULFATE HFA 108 (90 BASE) MCG/ACT IN AERS
1.0000 | INHALATION_SPRAY | RESPIRATORY_TRACT | Status: DC | PRN
  Administered 2021-12-13 – 2021-12-17 (×8): 2 via RESPIRATORY_TRACT
  Filled 2021-12-13 (×2): qty 6.7

## 2021-12-13 MED ORDER — CYCLOBENZAPRINE HCL 10 MG PO TABS
10.0000 mg | ORAL_TABLET | Freq: Three times a day (TID) | ORAL | Status: DC | PRN
  Administered 2021-12-13 – 2021-12-17 (×8): 10 mg via ORAL
  Filled 2021-12-13 (×9): qty 1

## 2021-12-13 MED ORDER — PREDNISONE 20 MG PO TABS
20.0000 mg | ORAL_TABLET | Freq: Every day | ORAL | Status: DC
  Administered 2021-12-14 – 2021-12-18 (×5): 20 mg via ORAL
  Filled 2021-12-13 (×5): qty 1

## 2021-12-13 MED ORDER — MIRTAZAPINE 15 MG PO TABS
15.0000 mg | ORAL_TABLET | Freq: Every day | ORAL | Status: DC
  Administered 2021-12-13 – 2021-12-17 (×5): 15 mg via ORAL
  Filled 2021-12-13 (×5): qty 1

## 2021-12-13 MED ORDER — DIAZEPAM 5 MG/ML IJ SOLN
5.0000 mg | Freq: Once | INTRAMUSCULAR | Status: AC
  Administered 2021-12-13: 5 mg via INTRAVENOUS
  Filled 2021-12-13: qty 2

## 2021-12-13 MED ORDER — MAGNESIUM HYDROXIDE 400 MG/5ML PO SUSP: 30.0000 mL | Freq: Every day | ORAL | Status: DC | PRN

## 2021-12-13 MED ORDER — LIDOCAINE 5 % EX PTCH
1.0000 | MEDICATED_PATCH | CUTANEOUS | Status: DC
  Administered 2021-12-13: 1 via TRANSDERMAL
  Filled 2021-12-13 (×2): qty 1

## 2021-12-13 MED ORDER — CYCLOBENZAPRINE HCL 10 MG PO TABS: 10.0000 mg | ORAL_TABLET | Freq: Three times a day (TID) | ORAL | 0 refills | Status: DC | PRN

## 2021-12-13 MED ORDER — UMECLIDINIUM BROMIDE 62.5 MCG/ACT IN AEPB
1.0000 | INHALATION_SPRAY | Freq: Every day | RESPIRATORY_TRACT | Status: DC
  Administered 2021-12-13 – 2021-12-17 (×4): 1 via RESPIRATORY_TRACT
  Filled 2021-12-13: qty 7

## 2021-12-13 MED ORDER — ACETAMINOPHEN 325 MG PO TABS
650.0000 mg | ORAL_TABLET | Freq: Four times a day (QID) | ORAL | Status: DC | PRN
  Administered 2021-12-13 – 2021-12-17 (×6): 650 mg via ORAL
  Filled 2021-12-13 (×6): qty 2

## 2021-12-13 MED ORDER — HYDROCORTISONE 1 % EX LOTN
TOPICAL_LOTION | Freq: Three times a day (TID) | CUTANEOUS | Status: DC | PRN
  Administered 2021-12-17: 1 via TOPICAL
  Filled 2021-12-13: qty 118

## 2021-12-13 MED ORDER — FLUTICASONE FUROATE-VILANTEROL 100-25 MCG/ACT IN AEPB
1.0000 | INHALATION_SPRAY | Freq: Every day | RESPIRATORY_TRACT | Status: DC
  Administered 2021-12-13 – 2021-12-17 (×4): 1 via RESPIRATORY_TRACT
  Filled 2021-12-13: qty 28

## 2021-12-13 MED ORDER — IBUPROFEN 400 MG PO TABS: 400.0000 mg | ORAL_TABLET | Freq: Once | ORAL | Status: DC

## 2021-12-13 NOTE — BH Assessment (Signed)
Patient is to be admitted to [facility] today 12/13/21 by Dr.  Louis Meckel .  Attending Physician will be Dr.  Louis Meckel .   Patient has been assigned to room L26, by [facility] Charge Nurse, Vinnie Level.    ER staff is aware of the admission: [place], ER Secretary   Dr. Suzy Bouchard, ER MD  Anderson Malta. Patient's Nurse  Seth Bake, Patient Access.

## 2021-12-13 NOTE — Progress Notes (Signed)
Virtual Visit via Video Note  I connected with Colleen [name] on 12/13/21 at  8:30 AM EST by a video enabled telemedicine application and verified that I am speaking with the correct person using two identifiers.  Location Provider Location : [facility] Patient Location : Home  Participants: Patient , Spouse,Provider    I discussed the limitations of evaluation and management by telemedicine and the availability of in person appointments. The patient expressed understanding and agreed to proceed.   I discussed the assessment and treatment plan with the patient. The patient was provided an opportunity to ask questions and all were answered. The patient agreed with the plan and demonstrated an understanding of the instructions.   The patient was advised to call back or seek an in-person evaluation if the symptoms worsen or if the condition fails to improve as anticipated.                                                            [facility] MD OP Progress Note  12/13/2021 9:31 AM Colleen [name]  MRN:  [id number]  Chief Complaint:  Chief Complaint   Follow-up 61 year old Caucasian female, with history of MDD, PTSD, insomnia, chronic back pain, recent inpatient medical admission for acute COPD exacerbation pleuritic chest pain and hypoxic respiratory failure on home oxygen, presents for medication management.    HPI: Colleen [name] is a 61 year old Caucasian female, married, lives in [place], has a history of PTSD, MDD, insomnia, chronic back pain, COPD, history of mastectomy, radiation therapy per history, surgical history of nephrectomy, tubal ligation, recent acute COPD exacerbation improved on steroid taper, chronic hypoxic respiratory failure, on home oxygen 3 L, pleuritic chest pain, was evaluated by telemedicine today.  Patient with recent medical floor admission for acute COPD exacerbation treated with steroids, chronic hypoxic respiratory failure, currently on home oxygen, pleuritic chest pain,  chronic low back pain/left buttock pain, SI joint arthritis, hyponatremia  ( sodium - 126 on 12/10/2021) admitted 12/04/2021-discharged-12/10/2021.  Patient today appeared to be distressed, anxious, tearful in session.  Patient reports she has been having back to back anxiety attacks and none of her medications as beneficial.  Patient reports she has not been sleeping since she is in a lot of pain.  She reports she has not been eating and has loss of appetite.  She feels depressed about her situation.  She reports she is extremely anxious and has this feeling of impending doom.  She does have Valium available however that also does not seem to help at this dosage.  She is not interested in taking the Seroquel.  Patient reports she is extremely anxious that something bad is going to happen and that she might die.  Patient reports she is worried she is going to get so depressed that she is going to get to a point where she would want to hurt herself.  According to husband she is not currently doing well and he feels she is getting worse.  Has been agreeable to taking her to the emergency department for possible behavioral health admission.   Visit Diagnosis:    ICD-10-CM   1. Severe episode of recurrent major depressive disorder, without psychotic features ([place])  F33.2     2. GAD (generalized anxiety disorder)  F41.1     3. PTSD (post-traumatic  stress disorder)  F43.10     4. Insomnia due to medical condition  G47.01     5. Tobacco use disorder  F17.200     6. At risk for prolonged QT interval syndrome  Z91.89       Past Psychiatric History: Reviewed past psychiatric history from progress note on 03/04/2021.  Past trials of Paxil, Klonopin, trazodone, prazosin, desvenlafaxine, lorazepam.  Past Medical History:  Past Medical History:  Diagnosis Date   Anxiety    Asthma    Breast cancer ([place]) 2004   left breast   Chronic back pain    COPD (chronic obstructive pulmonary disease) ([place])     Depression    Personal history of radiation therapy     Past Surgical History:  Procedure Laterality Date   BREAST BIOPSY Left 2004   positive   BREAST BIOPSY Right    neg   BREAST LUMPECTOMY Left 2004   positive   LEFT HEART CATH AND CORONARY ANGIOGRAPHY N/A 11/06/2021   Procedure: LEFT HEART CATH AND CORONARY ANGIOGRAPHY;  Surgeon: Corey Skains, MD;  Location: [facility];  Service: Cardiovascular;  Laterality: N/A;   MASTECTOMY Left    MASTECTOMY     NEPHRECTOMY Left    TUBAL LIGATION      Family Psychiatric History: Reviewed family psychiatric history from progress note on 03/04/2021.  Family History:  Family History  Problem Relation Age of Onset   Breast cancer Cousin    Breast cancer Cousin    Bipolar disorder Daughter    Drug abuse Daughter    Alcohol abuse Maternal Grandfather     Social History: Reviewed social history from progress note on 03/04/2021. Social History   Socioeconomic History   Marital status: Married    Spouse name: Not on file   Number of children: 2   Years of education: GED   Highest education level: Not on file  Occupational History   Not on file  Tobacco Use   Smoking status: Former    Packs/day: 0.50    Types: Cigarettes   Smokeless tobacco: Never   Tobacco comments:    Reports quit smoking 2 days ago- 11/19/2021  Vaping Use   Vaping Use: Never used  Substance and Sexual Activity   Alcohol use: Yes   Drug use: Never   Sexual activity: Not Currently  Other Topics Concern   Not on file  Social History Narrative   Not on file   Social Determinants of Health   Financial Resource Strain: Not on file  Food Insecurity: Not on file  Transportation Needs: Not on file  Physical Activity: Not on file  Stress: Not on file  Social Connections: Not on file    Allergies:  Allergies  Allergen Reactions   Desvenlafaxine Anaphylaxis   Morphine And Related Anaphylaxis   Penicillins Anaphylaxis    Has patient had a PCN  reaction causing immediate rash, facial/tongue/throat swelling, SOB or lightheadedness with hypotension: Yes Has patient had a PCN reaction causing severe rash involving mucus membranes or skin necrosis: No Has patient had a PCN reaction that required hospitalization: Unknown Has patient had a PCN reaction occurring within the last 10 years: No If all of the above answers are "NO", then may proceed with Cephalosporin use.    Prazosin Other (See Comments)   Tamoxifen Anaphylaxis   Trazodone Anaphylaxis   Clonazepam    Codeine Swelling   Duloxetine Itching   Gabapentin Hives    Rash and swelling.  Hydroxyzine Itching   Lorazepam     Patient feels this medications makes her to sedated and wants to avoid   Paroxetine Hcl Hives   Sulfa Antibiotics Itching   Cephalexin Rash    Metabolic Disorder Labs: Lab Results  Component Value Date   HGBA1C 5.4 10/12/2018   MPG 108.28 10/12/2018   No results found for: PROLACTIN Lab Results  Component Value Date   CHOL 180 11/05/2021   TRIG 49 11/05/2021   HDL 67 11/05/2021   CHOLHDL 2.7 11/05/2021   VLDL 10 11/05/2021   LDLCALC 103 (H) 11/05/2021   Lab Results  Component Value Date   TSH 0.864 10/21/2021   TSH 0.616 03/05/2021    Therapeutic Level Labs: No results found for: LITHIUM No results found for: VALPROATE No components found for:  CBMZ  Current Medications: Current Outpatient Medications  Medication Sig Dispense Refill   acetaminophen (TYLENOL) 500 MG tablet Take 500 mg by mouth every 6 (six) hours as needed for headache, fever, moderate pain or mild pain.     cyclobenzaprine (FLEXERIL) 5 MG tablet Take 1 tablet (5 mg total) by mouth 3 (three) times daily as needed for up to 15 doses for muscle spasms. 15 tablet 0   denosumab (PROLIA) 60 MG/ML SOSY injection Inject 60 mg into the skin every 6 (six) months.     diazepam (VALIUM) 2 MG tablet TAKE 1 TABLET (2 MG TOTAL) BY MOUTH DAILY AS NEEDED FOR ANXIETY. FOR SEVERE PANIC  SYMPTOMS 15 tablet 0   doxycycline (VIBRA-TABS) 100 MG tablet Take 1 tablet (100 mg total) by mouth every 12 (twelve) hours for 4 days. 8 tablet 0   EPINEPHrine 0.3 mg/0.3 mL IJ SOAJ injection Inject 0.3 mg into the muscle as needed.     fluticasone (FLONASE) 50 MCG/ACT nasal spray Place 2 sprays into both nostrils daily. 9.9 mL 0   Fluticasone-Umeclidin-Vilant (TRELEGY ELLIPTA) 100-62.5-25 MCG/INH AEPB Inhale 1 puff into the lungs daily.     hydrocortisone 1 % lotion Apply topically 3 (three) times daily as needed for itching. 118 mL 0   ipratropium (ATROVENT) 0.02 % nebulizer solution Take by nebulization 4 (four) times daily.     ketorolac (TORADOL) 10 MG tablet Take 10 mg by mouth every 4 (four) hours as needed.     lidocaine (LIDODERM) 5 % Place 1 patch onto the skin daily for 14 days. Remove & Discard patch within 12 hours or as directed by MD 14 patch 0   predniSONE (DELTASONE) 20 MG tablet Take 1 tablet (20 mg total) by mouth daily with breakfast for 3 days. 3 tablet 0   sodium chloride 1 g tablet Take 1 tablet (1 g total) by mouth 2 (two) times daily with a meal for 7 days. 14 tablet 0   VENTOLIN HFA 108 (90 Base) MCG/ACT inhaler Inhale 1-2 puffs into the lungs every 4 (four) hours as needed for cough or wheezing.     No current facility-administered medications for this visit.     Musculoskeletal: Strength & Muscle Tone:  UTA Gait & Station:  Seated Patient leans: N/A  Psychiatric Specialty Exam: Review of Systems  Constitutional:  Positive for appetite change and fatigue.  Musculoskeletal:  Positive for back pain.  Psychiatric/Behavioral:  Positive for decreased concentration, dysphoric mood and sleep disturbance. The patient is nervous/anxious.   All other systems reviewed and are negative.  There were no vitals taken for this visit.There is no height or weight on file to calculate BMI.  General Appearance: Casual  Eye Contact:  Fair  Speech:  Clear and Coherent  Volume:   Normal  Mood:  Anxious, Depressed, and Irritable  Affect:  Tearful  Thought Process:  Goal Directed and Descriptions of Associations: Circumstantial  Orientation:  Full (Time, Place, and Person)  Thought Content: Rumination   Suicidal Thoughts:   although does not express it now reports she is worried she is going to have that soon if she does not get help  Homicidal Thoughts:  No  Memory:  Immediate;   Fair Recent;   Fair Remote;   Fair  Judgement:  Fair  Insight:  Fair  Psychomotor Activity:  Normal  Concentration:  Concentration: Poor and Attention Span: Poor  Recall:  [employer] of Knowledge: Fair  Language: Fair  Akathisia:  No  Handed:  Right  AIMS (if indicated): not done  Assets:  Communication Skills Desire for Improvement Housing Intimacy Social Support Transportation  ADL's:  Intact  Cognition: WNL  Sleep:  Poor   Screenings: GAD-7    Flowsheet Row Video Visit from 12/13/2021 in [facility] Video Visit from 11/19/2021 in [facility] Video Visit from 10/23/2021 in [facility] Video Visit from 06/19/2021 in [facility] Video Visit from 04/03/2021 in [facility]  Total GAD-7 Score 21 14 2 3 6       PHQ2-9    Flowsheet Row Video Visit from 11/19/2021 in [facility] Video Visit from 11/12/2021 in [facility] Video Visit from 10/23/2021 in [facility] Video Visit from 06/19/2021 in [facility] Procedure visit from 06/17/2021 in [facility]  PHQ-2 Total Score 5 1 0 1 2  PHQ-9 Total Score 12 -- -- 2 5      Flowsheet Row Video Visit from 12/13/2021 in [facility] to Hosp-Admission (Discharged) from 12/04/2021 in [facility] (1C) Video Visit from 11/19/2021 in [facility] CATEGORY Moderate Risk Moderate Risk No Risk        Assessment and Plan: Earlyn Sylvan is a 61 year old Caucasian female, married, has a history of anxiety, MDD, PTSD, chronic pain, recent discharge from medical floor on 12/10/2021 as noted above currently on home oxygen, is currently struggling with her mood, having panic attacks, significant insomnia.  Patient currently decompensating and will benefit from possible behavioral health evaluation for possibility of admission .  Collateral information obtained from husband Colleen [name] as noted above.  Plan MDD-severe-unstable Will not make any medication changes today. Although Seroquel is available at low dosage patient reports she is not compliant. We will refer her to the emergency department at [facility]. I have already communicated with psych provider on-call-Colleen [name]-discussed patient, discussed evaluation for possible behavioral health admission and stabilization.   GAD-unstable Continue Valium 2 mg p.o. daily as needed Patient advised to limit use. Patient was referred for [facility] in the past-noncompliant.  Insomnia-unstable Patient will benefit from medication management and stabilization. Multiple factors contributing to sleep problems including her pain and anxiety.  Patient also is currently on home oxygen for COPD exacerbation.  I have communicated with provider on cal- ColleenJamison [name].  I have reviewed notes per most recent medical admission per Dr. Maren Beach KC-dated 12/04/2021 - 12/10/2021-as noted above.  Collateral information obtained from husband Colleen [name] as noted above.  Husband agrees to take the patient to the  emergency department for possible admission.  Patient has completed genesight testing in the past, could review this for medication changes as needed.  Follow-up in clinic as needed.   This note was generated in part  or whole with voice recognition software. Voice recognition is usually quite accurate but there are transcription errors that can and very often do occur. I apologize for any typographical errors that were not detected and corrected.      Ursula Alert, MD 12/13/2021, 9:31 AM

## 2021-12-13 NOTE — Plan of Care (Signed)
Patient admitted voluntary to [facility] from [facility] ED with diagnosis of worsening back pain. Patient stated to her PCP that the pain is so bad she wants to kill herself. Patient presents to unit via [facility].  Patient states, "my back is hurting so bad I cant sleep or eat." Patient's affect is anxious and worried, speech is rapid and thoughts are organized. Patient endorses depression and anxiety at 10/10 stating her main stressors are her back pain. Patient currently denies suicidal ideations, homicidal ideations, audio or visual hallucinations and verbally contracts for safety on unit.  Reports chronic neck and back pain 10/10.  Pt reports generalized weakness and using cane at home. Denies incontinence and reports last BM yesterday.  Patient reports she does smoke cigarettes. Patient reports living at home with her husband who is her support system- they have 2 children. When asked what her goals were she stated she wants to get her pain under control.   Emotional support and reassurance provided throughout admission intake. Afterwards, oriented patient to unit, room and call light, reviewed POC with all questions answered and understanding verbalzied.  Scheduled and prn meds given as ordered.  Pt compliant with all meds, swallows without difficulty. Denies any needs at this time.  Will continue to monitor with ongoing Q 15 minute safety checks per unit protocol.  Problem: Education: Goal: Knowledge of [facility] Education information/materials will improve Outcome: Not Progressing Goal: Emotional status will improve Outcome: Not Progressing Goal: Mental status will improve Outcome: Not Progressing   Problem: Activity: Goal: Interest or engagement in activities will improve Outcome: Not Progressing Goal: Sleeping patterns will improve Outcome: Not Progressing   Problem: Coping: Goal: Ability to verbalize frustrations and anger appropriately will improve Outcome: Not Progressing Goal:  Ability to demonstrate self-control will improve Outcome: Not Progressing   Problem: Safety: Goal: Periods of time without injury will increase Outcome: Not Progressing

## 2021-12-13 NOTE — ED Triage Notes (Signed)
Patient c/o severe back pain from neck to lower back. Appears very uncomfortable in wheelchair. Reports she was just admitted X2 weeks ago for copd exacerbation. Reports her back was hurting the entire time in hospital and has been for months but now it is severe. Patient reports her psychiatrist sent her here because patient made the statement "my pain is so severe I am afraid I would hurt myself." Patient denies SI/HI or thoughts of wanting to hurt herself. Spoke to her PCP who gave her toradol and patient reports no relief.
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
## 2021-12-13 NOTE — Plan of Care (Signed)
°  Problem: Education: Goal: Knowledge of [facility] Education information/materials will improve 12/13/2021 1756 by Conard Novak, RN Outcome: Not Progressing 12/13/2021 1756 by Conard Novak, RN Outcome: Not Progressing Goal: Emotional status will improve 12/13/2021 1756 by Conard Novak, RN Outcome: Not Progressing 12/13/2021 1756 by Conard Novak, RN Outcome: Not Progressing Goal: Mental status will improve 12/13/2021 1756 by Conard Novak, RN Outcome: Not Progressing 12/13/2021 1756 by Conard Novak, RN Outcome: Not Progressing   Problem: Activity: Goal: Interest or engagement in activities will improve 12/13/2021 1756 by Conard Novak, RN Outcome: Not Progressing 12/13/2021 1756 by Conard Novak, RN Outcome: Not Progressing Goal: Sleeping patterns will improve 12/13/2021 1756 by Conard Novak, RN Outcome: Not Progressing 12/13/2021 1756 by Conard Novak, RN Outcome: Not Progressing   Problem: Coping: Goal: Ability to verbalize frustrations and anger appropriately will improve 12/13/2021 1756 by Conard Novak, RN Outcome: Not Progressing 12/13/2021 1756 by Conard Novak, RN Outcome: Not Progressing Goal: Ability to demonstrate self-control will improve 12/13/2021 1756 by Conard Novak, RN Outcome: Not Progressing 12/13/2021 1756 by Conard Novak, RN Outcome: Not Progressing   Problem: Safety: Goal: Periods of time without injury will increase 12/13/2021 1756 by Conard Novak, RN Outcome: Not Progressing 12/13/2021 1756 by Conard Novak, RN Outcome: Not Progressing

## 2021-12-13 NOTE — ED Provider Notes (Signed)
[facility] Provider Note    Event Date/Time   First MD Initiated Contact with Patient 12/13/21 1128     (approximate)   History   Back Pain and Mental Health Problem   HPI Colleen [name] is a 61 y.o. female with a past medical history of COPD and chronic back pain who presents for back pain and suicidal ideation.  Patient states that she has chronic 10/10 back pain from neck to lower back and that she was admitted 2 weeks ago for this.  Per chart review patient was admitted for a COPD exacerbation and began complaining of chronic back pain during this visit.  Patient states that her back pain has been hurting for months now and it is becoming worse.  Patient also presents today after seeing her psychiatrist and stating "my pain is so severe I am afraid I would hurt myself".  Patient currently denies any SI/HI or thoughts of wanting to hurt herself.     Physical Exam   Triage Vital Signs: ED Triage Vitals  Enc Vitals Group     BP 12/13/21 0931 (!) 125/99     Pulse Rate 12/13/21 0931 (!) 115     Resp 12/13/21 0931 20     Temp 12/13/21 0931 99 F (37.2 C)     Temp Source 12/13/21 0931 Oral     SpO2 12/13/21 0931 94 %     Weight 12/13/21 0932 123 lb (55.8 kg)     Height 12/13/21 0932 5' (1.524 m)     Head Circumference --      Peak Flow --      Pain Score 12/13/21 0932 10     Pain Loc --      Pain Edu? --      Excl. in [place]? --     Most recent vital signs: Vitals:   12/13/21 1230 12/13/21 1300  BP: (!) 136/91 134/86  Pulse: (!) 114 93  Resp: (!) 22 20  Temp:    SpO2: 100% 100%    General: Awake, oriented x4 CV:  Good peripheral perfusion.  Resp:  Normal effort.  Abd:  No distention.  Other:  Elderly Caucasian female laying in bed in mild distress secondary to pain   ED Results / Procedures / Treatments   Labs (all labs ordered are listed, but only abnormal results are displayed) Labs Reviewed  COMPREHENSIVE METABOLIC PANEL - Abnormal;  Notable for the following components:      Result Value   Sodium 126 (*)    Chloride 90 (*)    Glucose, Bld 108 (*)    BUN 6 (*)    All other components within normal limits  SALICYLATE LEVEL - Abnormal; Notable for the following components:   Salicylate Lvl <3.0 (*)    All other components within normal limits  ACETAMINOPHEN LEVEL - Abnormal; Notable for the following components:   Acetaminophen (Tylenol), Serum <10 (*)    All other components within normal limits  URINE DRUG SCREEN, QUALITATIVE ([facility] ONLY) - Abnormal; Notable for the following components:   Benzodiazepine, Ur Scrn POSITIVE (*)    All other components within normal limits  RESP PANEL BY RT-PCR (FLU A&B, COVID) ARPGX2  ETHANOL  CBC WITH DIFFERENTIAL/PLATELET  TROPONIN I (HIGH SENSITIVITY)  TROPONIN I (HIGH SENSITIVITY)     EKG ED ECG REPORT I, Colleen [name], the attending physician, personally viewed and interpreted this ECG.  Date: 12/13/2021 EKG Time: 0941 Rate: 116 Rhythm: Tachycardic sinus  rhythm QRS Axis: normal Intervals: normal ST/T Wave abnormalities: normal Narrative Interpretation: Tachycardic sinus rhythm.  No evidence of acute ischemia  PROCEDURES:  Critical Care performed: No  Procedures   MEDICATIONS ORDERED IN ED: Medications  sodium chloride 0.9 % bolus 1,000 mL (0 mLs Intravenous Stopped 12/13/21 1438)  cyclobenzaprine (FLEXERIL) tablet 10 mg (10 mg Oral Given 12/13/21 1230)  diazepam (VALIUM) injection 5 mg (5 mg Intravenous Given 12/13/21 1230)  ketamine 50 mg in normal saline 5 mL (10 mg/mL) syringe (17 mg Intravenous Given 12/13/21 1437)     IMPRESSION / MDM / ASSESSMENT AND PLAN / ED COURSE  I reviewed the triage vital signs and the nursing notes.                              Differential diagnosis includes, but is not limited to, vertebral fracture, sciatic back pain, nerve impingement, major depressive disorder  The patient is on the cardiac monitor to evaluate for evidence  of arrhythmia and/or significant heart rate changes.  Patient presents for low back pain. Given History and Exam the patient appears to be at low risk for Spinal Cord Compression Syndrome, Vertebral Malignancy/Mets, acute Spinal Fracture, Vertebral Osteomyelitis, Epidural Abscess, Infected or Obstructing Kidney Stone.  Their presentation appears most likely to be secondary to non-emergent musculoskeletal etiology vs non-emergent disc herniation.  ED Workup: Defer imaging for outpatient follow up at this time.  Laboratory evaluation significant for CMP showing sodium of 126 that is unchanged from 3 weeks prior to arrival.  Will administer 1 L NS while in the emergency department and encourage PCP follow-up for recheck within the next week  Recommend continuing Flexeril and Valium for pain control as an outpatient  Thoughts are linear and organized, and patient has no AH, VH, or HI. Prior suicide attempt: Denies Prior Psychiatric Hospitalizations: Denies  Clinically patient displays no overt toxidrome; they are well appearing, with low suspicion for toxic ingestion given history and exam. Thoughts unlikely 2/2 anemia, hypothyroidism, infection, or ICH.  Consult: Psychiatry to evaluate patient for potential hold for danger to self. Disposition: Patient is medically cleared and plan admit to psychiatry for further management of symptoms.      FINAL CLINICAL IMPRESSION(S) / ED DIAGNOSES   Final diagnoses:  Chronic bilateral back pain, unspecified back location  Depression, unspecified depression type     Rx / DC Orders   ED Discharge Orders          Ordered    cyclobenzaprine (FLEXERIL) 10 MG tablet  3 times daily PRN        12/13/21 1408             Note:  This document was prepared using Dragon voice recognition software and may include unintentional dictation errors.   Colleen Plummer, MD 12/13/21 [phone]

## 2021-12-14 DIAGNOSIS — F332 Major depressive disorder, recurrent severe without psychotic features: Principal | ICD-10-CM

## 2021-12-14 MED ORDER — CITALOPRAM HYDROBROMIDE 20 MG PO TABS
10.0000 mg | ORAL_TABLET | Freq: Every day | ORAL | Status: DC
  Administered 2021-12-14 – 2021-12-18 (×5): 10 mg via ORAL
  Filled 2021-12-14 (×6): qty 1

## 2021-12-14 MED ORDER — LIDOCAINE 5 % EX PTCH
2.0000 | MEDICATED_PATCH | CUTANEOUS | Status: DC
  Administered 2021-12-14: 1 via TRANSDERMAL
  Administered 2021-12-15 – 2021-12-17 (×3): 2 via TRANSDERMAL
  Filled 2021-12-14 (×6): qty 2

## 2021-12-14 MED ORDER — MENTHOL 3 MG MT LOZG
1.0000 | LOZENGE | OROMUCOSAL | Status: DC | PRN
  Administered 2021-12-14 – 2021-12-16 (×3): 3 mg via ORAL
  Filled 2021-12-14 (×3): qty 9

## 2021-12-14 MED ORDER — GUAIFENESIN ER 600 MG PO TB12
600.0000 mg | ORAL_TABLET | Freq: Two times a day (BID) | ORAL | Status: DC | PRN
  Administered 2021-12-14 – 2021-12-17 (×6): 600 mg via ORAL
  Filled 2021-12-14 (×7): qty 1
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
## 2021-12-14 NOTE — Progress Notes (Signed)
Patient denies SI, HI, and AVH. Patient reports 10/10 pain in her back, which she says is affecting her ability to walk. Patient was given Tylenol and Flexeril PRN. Patient says she never would hurt herself, she just wants to kill the pain. Patient appears very anxious and irritable. She states that it is too loud and there are too many things going on in the dayroom. Patient remains on 1:1 observation due to her being on O2. Patient remains safe on the unit at this time.

## 2021-12-14 NOTE — H&P (Signed)
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 has insomnia.   Blood pressure 125/81, pulse 99, temperature 98 F (36.7 C), temperature source Oral, resp. rate 18, height 5' (1.524 m), weight 55.8 kg, SpO2 95 %. Body mass index is 24.02 kg/m.  Treatment Plan Summary: Daily contact with patient to assess and evaluate symptoms and progress in treatment and Medication management  ASSESSMENT: Patient is seen and examined.  Patient is a 61 year old female patient with the above-stated past psychiatric and medical history who was admitted last night secondary to severe  depression and suicidal ideations.  PLAN: -inpatient psychiatric admission will be continued. -patient will be integrated in the milieu.   -patient will be encouraged to attend groups.    -Medications: after reviewing past med trials, Gene Psych results, we decided to start Citalopram (Celexa) for depression and anxiety at 10 mg p.o. daily initially under close constant observation due to past history of allergic reactions to SSRIs and SNRIs. We will continue Mirtazapine 15 mg p.o. nightly for depression and to assist with her insomnia. Due to pain being the main trigger for patient`s depression and presence of current severe pain, I will adjust Lidocaine higher to 2 patches BID. All other home medications will be continued without changes for now.    citalopram  10 mg Oral Daily   doxycycline  100 mg Oral Q12H   fluticasone furoate-vilanterol  1 puff Inhalation Daily   And   umeclidinium bromide  1 puff Inhalation Daily   lidocaine  2 patch Transdermal Q24H   mirtazapine  15 mg Oral QHS   predniSONE  20 mg Oral Q breakfast   sodium chloride  1 g Oral BID WC   PRN: acetaminophen, albuterol, alum & mag hydroxide-simeth, cyclobenzaprine, diazepam, guaiFENesin, hydrocortisone, magnesium hydroxide, menthol-cetylpyridinium, QUEtiapine  -We will attempt to collect collateral information.  -Disposition will be determined after the patient is stabilized.     Observation Level/Precautions:  1 to 1  Laboratory:  Psychotherapy:    Medications:    Consultations:    Discharge Concerns:    Estimated LOS:  Other:     Physician Treatment Plan for Primary Diagnosis: Major depressive disorder, recurrent severe without psychotic features ([place]) Long Term Goal(s): Improvement in symptoms so as ready for discharge  Short Term Goals: Ability to identify changes in lifestyle to reduce recurrence of condition will improve, Ability to verbalize feelings will improve, Ability to disclose and  discuss suicidal ideas, Ability to demonstrate self-control will improve, Ability to identify and develop effective coping behaviors will improve, Ability to maintain clinical measurements within normal limits will improve, Compliance with prescribed medications will improve, and Ability to identify triggers associated with substance abuse/mental health issues will improve  Physician Treatment Plan for Secondary Diagnosis: Principal Problem:   Major depressive disorder, recurrent severe without psychotic features ([place])  Long Term Goal(s): Improvement in symptoms so as ready for discharge  Short Term Goals: Ability to identify changes in lifestyle to reduce recurrence of condition will improve, Ability to verbalize feelings will improve, Ability to disclose and discuss suicidal ideas, Ability to demonstrate self-control will improve, Ability to identify and develop effective coping behaviors will improve, Ability to maintain clinical measurements within normal limits will improve, Compliance with prescribed medications will improve, and Ability to identify triggers associated with substance abuse/mental health issues will improve  I certify that inpatient services furnished can reasonably be expected to improve the patient's condition.    [name], MD 2/4/202311:25 AM

## 2021-12-14 NOTE — [facility] Suicide Risk Assessment (Signed)
[place] INPATIENT:  Family/Significant Other Suicide Prevention Education  Suicide Prevention Education:  Education Completed; Colleen [name], (husband) [phone], has been identified by the patient as the family member/significant other with whom the patient will be residing, and identified as the person(s) who will aid the patient in the event of a mental health crisis (suicidal ideations/suicide attempt).  With written consent from the patient, the family member/significant other has been provided the following suicide prevention education, prior to the and/or following the discharge of the patient.  The suicide prevention education provided includes the following: Suicide risk factors Suicide prevention and interventions National Suicide Hotline telephone number [facility] assessment telephone number [facility] Emergency Assistance [place] and/or Residential Mobile Crisis Unit telephone number  Request made of family/significant other to: Remove weapons (e.g., guns, rifles, knives), all items previously/currently identified as safety concern.   Remove drugs/medications (over-the-counter, prescriptions, illicit drugs), all items previously/currently identified as a safety concern.  The family member/significant other verbalizes understanding of the suicide prevention education information provided.  The family member/significant other agrees to remove the items of safety concern listed above.  Colleen [name] 12/14/2021, 12:31 PM

## 2021-12-14 NOTE — [facility] Suicide Risk Assessment (Signed)
[facility] Admission Suicide Risk Assessment   Nursing information obtained from:  Patient Demographic factors:  Caucasian Current Mental Status:  NA (WDL) Loss Factors:  NA Historical Factors:  NA Risk Reduction Factors:  Positive social support Education officer, museum)   Principal Problem: Major depressive disorder, recurrent severe without psychotic features ([place]) Diagnosis:  Principal Problem:   Major depressive disorder, recurrent severe without psychotic features ([place])    Patient is 61yo F with psychiatric history of depression anxiety, medical history of osteoporosis, chronic pain, COPD/asthma, breast cancer, allergies, who was admitted to inpatient Geriatric psychiatry unit due to worsened depression and suicidal ideations.     Subjective Data:  Currently identifies her mood as "always down", depressed, hopeless, tired during the day, unmotivated, has diminished interest or pleasure in activities, occasional recurrent thoughts of death or suicide. She reports one suicidal attempt 1.5 years ago as mentioned above. Currently, denies having any acute thoughts or plans of harming self or others.   SUICIDE RISK:   Mild:  Suicidal ideation of limited frequency, intensity, duration, and specificity.  There are no identifiable plans, no associated intent, mild dysphoria and related symptoms, good self-control (both objective and subjective assessment), few other risk factors, and identifiable protective factors, including available and accessible social support.  PLAN OF CARE:  -inpatient psychiatric admission will be continued. -patient will be integrated in the milieu.   -patient will be encouraged to attend groups.   -Medication management for current symptoms  -collateral information. -Disposition will be determined after the patient is stabilized.    I certify that inpatient services furnished can reasonably be expected to improve the patient's condition.   [name], MD 12/14/2021, 12:13  PM

## 2021-12-14 NOTE — Progress Notes (Signed)
Patient was medication complaint.  She remained in her room throughout the night.  She complained of ongoing back pain.  Patch was applied to back.  Patient started coughing, reports it was due to COPD.  She was given albuterol and the cough subsided.  Respiratory therapist came and assessed the patient.  Pt complained of post nasal drip and sinus pressure. She was 1:1 observation.  She was A & O x4, pleasant.  Q15 minute safety checks performed per unit protocol.

## 2021-12-14 NOTE — Progress Notes (Signed)
Patient is calmly eating lunch in the dayroom with 1:1 safety sitter. Patient remains safe on the unit at this time.

## 2021-12-14 NOTE — Progress Notes (Signed)
Called  to pts room to assess. Pt sitting up in bed c/o post nasal drip and sinus pressure. Pts VS WNL. BBS clear. RN aware of assessment and was going to speak with MD.

## 2021-12-14 NOTE — Progress Notes (Addendum)
Patient is eating dinner in the dayroom and is not observed to be in any distress. Patient remains on 1:1 for suicide precautions.

## 2021-12-15 DIAGNOSIS — F332 Major depressive disorder, recurrent severe without psychotic features: Secondary | ICD-10-CM | POA: Diagnosis not present

## 2021-12-15 MED ORDER — SALINE SPRAY 0.65 % NA SOLN
1.0000 | NASAL | Status: DC | PRN
  Administered 2021-12-15 – 2021-12-17 (×4): 1 via NASAL
  Filled 2021-12-15 (×2): qty 44

## 2021-12-15 NOTE — Progress Notes (Signed)
°   12/15/21 2300  Vital Signs  Pulse Rate (!) 101  Pulse Rate Source Monitor  Resp 20  BP 138/90  BP Location Right Arm  BP Method Automatic  Patient Position (if appropriate) Sitting  Level of Consciousness  Level of Consciousness Alert  MEWS COLOR  MEWS Score Color Green  Oxygen Therapy  SpO2 93 %  O2 Device Room Air  Pain Assessment  Pain Scale 0-10  Pain Score 8  Pain Type Chronic pain  Pain Location Back   Patient c/o back pain when the Nurse went to get her medication Patient started coughing a lot. Prn Flexeril  10 mg given for spasm and Tylenol 650 mg for back pain 8/10, and cepacol for cough. V/S WNL.  Support and encouragement provided.

## 2021-12-15 NOTE — Progress Notes (Signed)
Patient in day room eating lunch.  Reports relief from anxiety with prn Valium.  Continue 1:1 observation for safety.

## 2021-12-15 NOTE — Progress Notes (Addendum)
Patient presents A&O x 4. Patient affect is anxious and irritable. Denies AVH, SI or HI.  Endorses anxiety 6/10 and depression 4/10.  Rates lower back pain 8/10.  Lidoderm patch applied and Flexeril given for pain.  Valium given for anxiety.  VSS.  Patient compliant with all scheduled meds.  Patient spent most of morning in day room and remains 1:1 observation due to O2.  Ongoing Q15 minute safety check rounds per unit protocol.

## 2021-12-15 NOTE — Progress Notes (Signed)
[facility] MD Progress Note  12/15/2021 10:21 AM Lamaria Hildebrandt  MRN:  [id number]  Principal Problem: Major depressive disorder, recurrent severe without psychotic features ([place]) Diagnosis: Principal Problem:   Major depressive disorder, recurrent severe without psychotic features ([place])  Patient is 61yo F with psychiatric history of depression anxiety, medical history of osteoporosis, chronic pain, COPD/asthma, breast cancer, allergies, who was admitted to inpatient Geriatric psychiatry unit due to worsened depression and suicidal ideations.     Interval History Patient was seen today for re-evaluation.  Nursing reports no events overnight. The patient has no issues with performing ADLs.  Patient has been medication compliant.    Subjective:  On assessment patient reports "I am okay" . She continues to report several somatic complaints, such as back pain, cough due to post-nasal drip, insomnia due to pain. She reports that her mood is "okay", denies feeling suicidal. Repots no adverse reactions after taking  the new medication Citalopram on two occasions by now. Reports that her anxiety level is not high today as well. She reports she can contract for safety if discharged. Her husband is very supportive, visited her last night. She has an appointment with neurologist on Wednesday and hopes they can offer something different to address her pain.   Current suicidal/homicidal ideations: Denies Current auditory/visual hallucinations: Denies The patient reports no side effects from medications.    Labs: no new results for review.     Total Time spent with patient: 20 minutes  Past Psychiatric History:  Previous psych diagnoses: depressive disorder, anxiety, panic attacks. Previous psychiatric hospitalizations: [facility] 03/2021 Previous outpatient psychiatrist: Dr. Shea Evans History of prior suicide attempts: cut wrist 03/2021 Previous psych medication: Zoloft, Prozac, Pristiq ->anaphylaxis;  Duloxetine -> itching; Trazodone -> anaphylaxis; Paroxetine -> rash; Prazosin -> some allergy; Lorazepam -> severe sedation; Clonazepam -> itching. Prior to admission psych medication: Remeron 15mg  PO QHS  Past Medical History:  Past Medical History:  Diagnosis Date   Anxiety    Asthma    Breast cancer ([place]) 2004   left breast   Chronic back pain    COPD (chronic obstructive pulmonary disease) ([place])    Depression    Personal history of radiation therapy     Past Surgical History:  Procedure Laterality Date   BREAST BIOPSY Left 2004   positive   BREAST BIOPSY Right    neg   BREAST LUMPECTOMY Left 2004   positive   LEFT HEART CATH AND CORONARY ANGIOGRAPHY N/A 11/06/2021   Procedure: LEFT HEART CATH AND CORONARY ANGIOGRAPHY;  Surgeon: Corey Skains, MD;  Location: [place] [facility];  Service: Cardiovascular;  Laterality: N/A;   MASTECTOMY Left    MASTECTOMY     NEPHRECTOMY Left    TUBAL LIGATION     Family History:  Family History  Problem Relation Age of Onset   Breast cancer Cousin    Breast cancer Cousin    Bipolar disorder Daughter    Drug abuse Daughter    Alcohol abuse Maternal Grandfather    Family Psychiatric  History: see H&P Social History:  Social History   Substance and Sexual Activity  Alcohol Use Never     Social History   Substance and Sexual Activity  Drug Use Never   Comment: prescribed muscle relaxers and valium    Social History   Socioeconomic History   Marital status: Married    Spouse name: Not on file   Number of children: 2   Years of education: GED  Highest education level: Not on file  Occupational History   Not on file  Tobacco Use   Smoking status: Every Day    Packs/day: 0.50    Types: Cigarettes    Passive exposure: Never   Smokeless tobacco: Never  Vaping Use   Vaping Use: Never used  Substance and Sexual Activity   Alcohol use: Never   Drug use: Never    Comment: prescribed muscle relaxers and valium    Sexual activity: Not Currently  Other Topics Concern   Not on file  Social History Narrative   Not on file   Social Determinants of Health   Financial Resource Strain: Not on file  Food Insecurity: Not on file  Transportation Needs: Not on file  Physical Activity: Not on file  Stress: Not on file  Social Connections: Not on file   Additional Social History:                         Sleep: Fair  Appetite:  Good  Current Medications: Current Facility-Administered Medications  Medication Dose Route Frequency Provider Last Rate Last Admin   acetaminophen (TYLENOL) tablet 650 mg  650 mg Oral Q6H PRN Patrecia Pour, NP   650 mg at 12/14/21 0822   albuterol (VENTOLIN HFA) 108 (90 Base) MCG/ACT inhaler 1-2 puff  1-2 puff Inhalation Q4H PRN Patrecia Pour, NP   2 puff at 12/14/21 2115   alum & mag hydroxide-simeth (MAALOX/MYLANTA) 200-200-20 MG/5ML suspension 30 mL  30 mL Oral Q4H PRN Patrecia Pour, NP       citalopram (CELEXA) tablet 10 mg  10 mg Oral Daily Horatio Bertz, Delrae Rend, MD   10 mg at 12/15/21 0931   cyclobenzaprine (FLEXERIL) tablet 10 mg  10 mg Oral TID PRN Patrecia Pour, NP   10 mg at 12/15/21 0935   diazepam (VALIUM) tablet 2 mg  2 mg Oral Daily PRN Patrecia Pour, NP   2 mg at 12/14/21 0916   doxycycline (VIBRA-TABS) tablet 100 mg  100 mg Oral Q12H Patrecia Pour, NP   100 mg at 12/15/21 0900   fluticasone furoate-vilanterol (BREO ELLIPTA) 100-25 MCG/ACT 1 puff  1 puff Inhalation Daily Patrecia Pour, NP   1 puff at 12/15/21 0900   And   umeclidinium bromide (INCRUSE ELLIPTA) 62.5 MCG/ACT 1 puff  1 puff Inhalation Daily Patrecia Pour, NP   1 puff at 12/15/21 0900   guaiFENesin (MUCINEX) 12 hr tablet 600 mg  600 mg Oral BID PRN [name], NP   600 mg at 12/14/21 1618   hydrocortisone 1 % lotion   Topical TID PRN Patrecia Pour, NP       lidocaine (LIDODERM) 5 % 2 patch  2 patch Transdermal Q24H [name], MD   1 patch at 12/14/21 2118   magnesium  hydroxide (MILK OF MAGNESIA) suspension 30 mL  30 mL Oral Daily PRN Patrecia Pour, NP       menthol-cetylpyridinium (CEPACOL) lozenge 3 mg  1 lozenge Oral PRN [name], MD   3 mg at 12/14/21 1401   mirtazapine (REMERON) tablet 15 mg  15 mg Oral QHS [name], DO   15 mg at 12/14/21 2116   predniSONE (DELTASONE) tablet 20 mg  20 mg Oral Q breakfast Patrecia Pour, NP   20 mg at 12/15/21 0900   QUEtiapine (SEROQUEL) tablet 100 mg  100 mg Oral QHS PRN Caren Griffins  Edward, DO   100 mg at 12/14/21 2116   sodium chloride (OCEAN) 0.65 % nasal spray 1 spray  1 spray Each Nare PRN [name], MD       sodium chloride tablet 1 g  1 g Oral BID WC Patrecia Pour, NP   1 g at 12/15/21 0900    Lab Results:  Results for orders placed or performed during the hospital encounter of 12/13/21 (from the past 48 hour(s))  Troponin I (High Sensitivity)     Status: None   Collection Time: 12/13/21 12:20 PM  Result Value Ref Range   Troponin I (High Sensitivity) 6 <18 ng/L    Comment: (NOTE) Elevated high sensitivity troponin I (hsTnI) values and significant  changes across serial measurements may suggest ACS but many other  chronic and acute conditions are known to elevate hsTnI results.  Refer to the "Links" section for chest pain algorithms and additional  guidance. Performed at [facility], [address] [place], [place] [address]   Resp Panel by RT-PCR (Flu A&B, Covid) Nasopharyngeal Swab     Status: None   Collection Time: 12/13/21 12:20 PM   Specimen: Nasopharyngeal Swab; Nasopharyngeal(NP) swabs in vial transport medium  Result Value Ref Range   SARS Coronavirus 2 by RT PCR NEGATIVE NEGATIVE    Comment: (NOTE) SARS-CoV-2 target nucleic acids are NOT DETECTED.  The SARS-CoV-2 RNA is generally detectable in upper respiratory specimens during the acute phase of infection. The lowest concentration of SARS-CoV-2 viral copies this assay can detect is 138  copies/mL. A negative result does not preclude SARS-Cov-2 infection and should not be used as the sole basis for treatment or other patient management decisions. A negative result may occur with  improper specimen collection/handling, submission of specimen other than nasopharyngeal swab, presence of viral mutation(s) within the areas targeted by this assay, and inadequate number of viral copies(<138 copies/mL). A negative result must be combined with clinical observations, patient history, and epidemiological information. The expected result is Negative.  Fact Sheet for Patients:  EntrepreneurPulse.com.au  Fact Sheet for Healthcare Providers:  IncredibleEmployment.be  This test is no t yet approved or cleared by the [place] FDA and  has been authorized for detection and/or diagnosis of SARS-CoV-2 by FDA under an Emergency Use Authorization (EUA). This EUA will remain  in effect (meaning this test can be used) for the duration of the COVID-19 declaration under Section 564(b)(1) of the Act, 21 U.S.C.section 360bbb-3(b)(1), unless the authorization is terminated  or revoked sooner.       Influenza A by PCR NEGATIVE NEGATIVE   Influenza B by PCR NEGATIVE NEGATIVE    Comment: (NOTE) The Xpert Xpress SARS-CoV-2/FLU/RSV plus assay is intended as an aid in the diagnosis of influenza from Nasopharyngeal swab specimens and should not be used as a sole basis for treatment. Nasal washings and aspirates are unacceptable for Xpert Xpress SARS-CoV-2/FLU/RSV testing.  Fact Sheet for Patients: EntrepreneurPulse.com.au  Fact Sheet for Healthcare Providers: IncredibleEmployment.be  This test is not yet approved or cleared by the [place] FDA and has been authorized for detection and/or diagnosis of SARS-CoV-2 by FDA under an Emergency Use Authorization (EUA). This EUA will remain in effect (meaning this test  can be used) for the duration of the COVID-19 declaration under Section 564(b)(1) of the Act, 21 U.S.C. section 360bbb-3(b)(1), unless the authorization is terminated or revoked.  Performed at [facility], [address] [place], [place] [address]     Blood  Alcohol level:  Lab Results  Component Value Date   ETH <10 12/13/2021   ETH <10 32/99/2426    Metabolic Disorder Labs: Lab Results  Component Value Date   HGBA1C 5.4 10/12/2018   MPG 108.28 10/12/2018   No results found for: PROLACTIN Lab Results  Component Value Date   CHOL 180 11/05/2021   TRIG 49 11/05/2021   HDL 67 11/05/2021   CHOLHDL 2.7 11/05/2021   VLDL 10 11/05/2021   LDLCALC 103 (H) 11/05/2021    Physical Findings: AIMS:  , ,  ,  ,    CIWA:    COWS:     Musculoskeletal: Strength & Muscle Tone: within normal limits Gait & Station: normal Patient leans: N/A  Psychiatric Specialty Exam: Appearance:  CF, appearing stated age, a wearing appropriate to the situation casual clothes, with fair grooming and hygiene. Normal level of alertness and appropriate facial expression.   Attitude/Behavior: calm, cooperative, engaging with appropriate eye contact.   Motor: WNL; dyskinesias not evident. Uses wheelchair.   Speech: spontaneous, clear, coherent, normal comprehension.   Mood: "okay".   Affect: restricted.   Thought process: patient appears coherent, organized, logical, goal-directed, associations are appropriate.   Thought content: patient denies suicidal thoughts, denies homicidal thoughts; did not express any delusions.   Thought perception: patient denies auditory and visual hallucinations. Did not appear internally stimulated.   Cognition: patient is alert and oriented in self, place, date; with intact attention and concentration.   Insight: fair, in regards of understanding of presence, nature, cause, and significance of mental or emotional problem.   Judgement: fair, in  regards of ability to make good decisions concerning the appropriate thing to do in various situations, including ability to form opinions regarding their mental health condition.   Physical Exam: Physical Exam ROS Blood pressure 133/85, pulse 96, temperature 97.8 F (36.6 C), temperature source Oral, resp. rate 20, height 5' (1.524 m), weight 55.8 kg, SpO2 91 %. Body mass index is 24.02 kg/m.   Treatment Plan Summary: Daily contact with patient to assess and evaluate symptoms and progress in treatment and Medication management  Patient is a 61 year old female with the above-stated past psychiatric history who is seen in follow-up.  Chart reviewed. Patient discussed with nursing. Patient continues to express several somatic  complaints related to her chronic illnesses. She reports partial mood improvement, denies suicidal thoughts, expresses future plans. Reports no adverse reactions to started yesterday Citalopram. No changes to medicines today. Anticipated discharge in 1-2 days. Husband is very supportive.     Plan: -continue inpatient psych admission; 15-minute checks; daily contact with patient to assess and evaluate symptoms and progress in treatment; psychoeducation.  -continue scheduled psychotropic medications: Citalopram (Celexa) for depression and anxiety at 10 mg p.o. daily  Mirtazapine 15 mg p.o. nightly for depression and to assist with her insomnia.  All medications:  citalopram  10 mg Oral Daily   doxycycline  100 mg Oral Q12H   fluticasone furoate-vilanterol  1 puff Inhalation Daily   And   umeclidinium bromide  1 puff Inhalation Daily   lidocaine  2 patch Transdermal Q24H   mirtazapine  15 mg Oral QHS   predniSONE  20 mg Oral Q breakfast   sodium chloride  1 g Oral BID WC    -continue PRN medications.  acetaminophen, albuterol, alum & mag hydroxide-simeth, cyclobenzaprine, diazepam, guaiFENesin, hydrocortisone, magnesium hydroxide, menthol-cetylpyridinium,  QUEtiapine, sodium chloride   -Disposition: Estimated duration of hospitalization 1-2 days. All necessary aftercare will be  arranged prior to discharge Likely d/c home with outpatient psych follow-up.  -  I certify that the patient does need, on a daily basis, active treatment furnished directly by or requiring the supervision of inpatient psychiatric facility personnel.    [name], MD 12/15/2021, 10:21 AM

## 2021-12-15 NOTE — Evaluation (Signed)
Physical Therapy Evaluation Patient Details Name: Colleen [name] MRN: [id number] DOB: 12-13-1960 Today's Date: 12/15/2021  History of Present Illness  Patient is 61 yo F with psychiatric history of depression and anxiety, medical history of osteoporosis, chronic pain, COPD/asthma, breast cancer, and allergies, who was admitted to inpatient geriatric psychiatry unit due to worsened depression and suicidal ideations.   Clinical Impression  Pt was pleasant and motivated to participate during the session and put forth good effort throughout.  Pt Ind with all functional tasks and demonstrated very good control and stability with amb and transfers including during dynamic gait assessment.  Pt reported mod back pain of 5/10 and stated has tried PT to address back pain in the recent past but it was made worse and pt not interested in further PT services.  Pt stated that she has an appointment with neurology to look for further options to address her back pain and reported feeling hopeful that neurologic intervention would be able to improve her pain. Will complete PT orders at this time but will reassess pt pending a change in status upon receipt of new PT orders.         Recommendations for follow up therapy are one component of a multi-disciplinary discharge planning process, led by the attending physician.  Recommendations may be updated based on patient status, additional functional criteria and insurance authorization.  Follow Up Recommendations No PT follow up    Assistance Recommended at Discharge    Patient can return home with the following  Assist for transportation    Equipment Recommendations None recommended by PT  Recommendations for Other Services       Functional Status Assessment Patient has not had a recent decline in their functional status     Precautions / Restrictions Precautions Precautions: None Restrictions Weight Bearing Restrictions: No      Mobility  Bed  Mobility               General bed mobility comments: NT    Transfers Overall transfer level: Independent                      Ambulation/Gait Ambulation/Gait assistance: Independent Gait Distance (Feet): 200 Feet Assistive device: None Gait Pattern/deviations: WFL(Within Functional Limits)       General Gait Details: Good stability including during start/stops, head turns, and 180 deg turns  Financial trader Rankin (Stroke Patients Only)       Balance Overall balance assessment: No apparent balance deficits (not formally assessed)                                           Pertinent Vitals/Pain Pain Assessment Pain Assessment: 0-10 Pain Score: 5  Pain Location: back Pain Descriptors / Indicators: Sore Pain Intervention(s): Premedicated before session, Monitored during session    Home Living Family/patient expects to be discharged to:: Private residence Living Arrangements: Spouse/significant other Available Help at Discharge: Family;Available 24 hours/day Type of Home: House Home Access: Stairs to enter Entrance Stairs-Rails: Psychiatric nurse of Steps: 5   Home Layout: One level Home Equipment: Cane - single point      Prior Function Prior Level of Function : Needs assist       Physical Assist : ADLs (physical)   ADLs (physical): Bathing  Mobility Comments: Ind amb community distances without an AD, no fall history ADLs Comments: SBA from spouse getting in/out of tub secondary to fear of falling     Hand Dominance        Extremity/Trunk Assessment   Upper Extremity Assessment Upper Extremity Assessment: Overall WFL for tasks assessed    Lower Extremity Assessment Lower Extremity Assessment: Overall WFL for tasks assessed       Communication   Communication: No difficulties  Cognition Arousal/Alertness: Awake/alert Behavior During Therapy: WFL for  tasks assessed/performed Overall Cognitive Status: Within Functional Limits for tasks assessed                                          General Comments      Exercises Other Exercises Other Exercises: Pt education provided on tub transfer bench and how to obtain if interested to facilitate Ind with bathing   Assessment/Plan    PT Assessment Patient does not need any further PT services  PT Problem List         PT Treatment Interventions      PT Goals (Current goals can be found in the Care Plan section)  Acute Rehab PT Goals PT Goal Formulation: All assessment and education complete, DC therapy    Frequency       Co-evaluation               AM-PAC PT "6 Clicks" Mobility  Outcome Measure Help needed turning from your back to your side while in a flat bed without using bedrails?: None Help needed moving from lying on your back to sitting on the side of a flat bed without using bedrails?: None Help needed moving to and from a bed to a chair (including a wheelchair)?: None Help needed standing up from a chair using your arms (e.g., wheelchair or bedside chair)?: None Help needed to walk in hospital room?: None Help needed climbing 3-5 steps with a railing? : None 6 Click Score: 24    End of Session   Activity Tolerance: Patient tolerated treatment well Patient left: Other (comment) (Pt left in break room where found, nursing aware) Nurse Communication: Mobility status PT Visit Diagnosis: Difficulty in walking, not elsewhere classified (R26.2)    Time: 0109-3235 PT Time Calculation (min) (ACUTE ONLY): 19 min   Charges:   PT Evaluation $PT Eval Low Complexity: 1 Low          D. Scott Avangelina Flight PT, DPT 12/15/21, 11:54 AM

## 2021-12-15 NOTE — Progress Notes (Signed)
°  Patient in bed refused oxygen stated " I really don/t need it I am feeling good at home my oxygen is usually 88 and today I have been in the nintees I feel fine" Patient educated on her Oxygen therapy oxygen 93% on RA. Patient denies SI/HI/A/VH and verbally contracted for safety. Support and encouragement provided. Will continue to monitor.

## 2021-12-15 NOTE — Progress Notes (Signed)
Patient is A & O x3.  She is compliant with medication.  Her spouse visited during hours.  She has 1:1 observation. Pt complained of post nasal drip and sinus pressure. Patient started coughing, reports it was due to COPD. Q15 minute safety checks performed per unit protocol.

## 2021-12-15 NOTE — Group Note (Signed)
LCSW Group Therapy Note  Group Date: 12/15/2021 Start Time: 1779 End Time: 1415   Type of Therapy and Topic:  Group Therapy - How To Cope with Nervousness about Discharge   Participation Level:  Active   Description of Group This process group involved identification of patients' feelings about discharge. Some of them are scheduled to be discharged soon, while others are new admissions, but each of them was asked to share thoughts and feelings surrounding discharge from the hospital. One common theme was that they are excited at the prospect of going home, while another was that many of them are apprehensive about sharing why they were hospitalized. Patients were given the opportunity to discuss these feelings with their peers in preparation for discharge.  Therapeutic Goals  Patient will identify their overall feelings about pending discharge. Patient will think about how they might proactively address issues that they believe will once again arise once they get home (i.e. with parents). Patients will participate in discussion about having hope for change.   Summary of Patient Progress: Patient was present for the entirety of the group session. Patient was an active listener and participated in the topic of discussion, provided helpful advice to others, and added nuance to topic of conversation. Patient presented with an anxious affect but remained cooperative throughout the session. Patient shared she has felt some relief from her back pain. Patient shared the insight that she has noticed how stress can trigger her back pain. Patient was receptive to feedback from [name] about how the connection between the mind and body. Patient shared that she hopes to socialize more in the future, adding that since moving from [place], she has felt isolated and has not felt a sense of purpose. Patient expressed interest in looking for community groups to become involved in post discharge. Patient processed  stressors including past family conflict and difficulty trusting others during the session.     Therapeutic Modalities Cognitive Behavioral Therapy   Colleen [name] 12/15/2021  2:46 PM

## 2021-12-15 NOTE — Progress Notes (Signed)
°   12/15/21 1659 12/15/21 2019 12/15/21 2124  Vital Signs  Temp 97.8 F (36.6 C) (!) 97.5 F (36.4 C)  --   Temp Source Oral Oral  --   Pulse Rate (!) 101 98 97  Pulse Rate Source Monitor Monitor Monitor  Resp  --  18 16  BP 138/85 (!) 135/95 (!) 135/94  BP Location Right Arm Right Arm Right Arm  BP Method Automatic Automatic  --   Patient Position (if appropriate) Sitting Sitting  --   MEWS COLOR  MEWS Score Color Ailene Ards [place]  Oxygen Therapy  SpO2 94 % 91 % 93 %  O2 Device Room [place]

## 2021-12-16 DIAGNOSIS — F332 Major depressive disorder, recurrent severe without psychotic features: Secondary | ICD-10-CM | POA: Diagnosis not present

## 2021-12-16 MED ORDER — DIAZEPAM 2 MG PO TABS
2.0000 mg | ORAL_TABLET | Freq: Once | ORAL | Status: AC
  Administered 2021-12-16: 2 mg via ORAL
  Filled 2021-12-16: qty 1

## 2021-12-16 MED ORDER — QUETIAPINE FUMARATE 100 MG PO TABS
200.0000 mg | ORAL_TABLET | Freq: Every day | ORAL | Status: DC
  Administered 2021-12-16 – 2021-12-17 (×2): 200 mg via ORAL
  Filled 2021-12-16 (×2): qty 2

## 2021-12-16 NOTE — BH IP Treatment Plan (Signed)
Interdisciplinary Treatment and Diagnostic Plan Update  12/16/2021 Time of Session: 10:00AM Colleen [name] MRN: [id number]  Principal Diagnosis: Major depressive disorder, recurrent severe without psychotic features ([place])  Secondary Diagnoses: Principal Problem:   Major depressive disorder, recurrent severe without psychotic features ([place])   Current Medications:  Current Facility-Administered Medications  Medication Dose Route Frequency Provider Last Rate Last Admin   acetaminophen (TYLENOL) tablet 650 mg  650 mg Oral Q6H PRN Patrecia Pour, NP   650 mg at 12/16/21 0550   albuterol (VENTOLIN HFA) 108 (90 Base) MCG/ACT inhaler 1-2 puff  1-2 puff Inhalation Q4H PRN Patrecia Pour, NP   2 puff at 12/16/21 0956   alum & mag hydroxide-simeth (MAALOX/MYLANTA) 200-200-20 MG/5ML suspension 30 mL  30 mL Oral Q4H PRN Patrecia Pour, NP       citalopram (CELEXA) tablet 10 mg  10 mg Oral Daily [name], MD   10 mg at 12/16/21 0956   cyclobenzaprine (FLEXERIL) tablet 10 mg  10 mg Oral TID PRN Patrecia Pour, NP   10 mg at 12/15/21 2251   diazepam (VALIUM) tablet 2 mg  2 mg Oral Daily PRN Patrecia Pour, NP   2 mg at 12/15/21 1039   doxycycline (VIBRA-TABS) tablet 100 mg  100 mg Oral Q12H Patrecia Pour, NP   100 mg at 12/16/21 0958   fluticasone furoate-vilanterol (BREO ELLIPTA) 100-25 MCG/ACT 1 puff  1 puff Inhalation Daily Patrecia Pour, NP   1 puff at 12/16/21 0735   And   umeclidinium bromide (INCRUSE ELLIPTA) 62.5 MCG/ACT 1 puff  1 puff Inhalation Daily Patrecia Pour, NP   1 puff at 12/16/21 0736   guaiFENesin (MUCINEX) 12 hr tablet 600 mg  600 mg Oral BID PRN [name], NP   600 mg at 12/15/21 2115   hydrocortisone 1 % lotion   Topical TID PRN Patrecia Pour, NP       lidocaine (LIDODERM) 5 % 2 patch  2 patch Transdermal Q24H [name], MD   2 patch at 12/15/21 2300   magnesium hydroxide (MILK OF MAGNESIA) suspension 30 mL  30 mL Oral Daily PRN Patrecia Pour, NP        menthol-cetylpyridinium (CEPACOL) lozenge 3 mg  1 lozenge Oral PRN [name], MD   3 mg at 12/16/21 0123   mirtazapine (REMERON) tablet 15 mg  15 mg Oral QHS [name], DO   15 mg at 12/15/21 2115   predniSONE (DELTASONE) tablet 20 mg  20 mg Oral Q breakfast Patrecia Pour, NP   20 mg at 12/16/21 0735   QUEtiapine (SEROQUEL) tablet 100 mg  100 mg Oral QHS PRN [name], DO   100 mg at 12/15/21 2116   sodium chloride (OCEAN) 0.65 % nasal spray 1 spray  1 spray Each Nare PRN [name], MD   1 spray at 12/16/21 0123   sodium chloride tablet 1 g  1 g Oral BID WC Patrecia Pour, NP   1 g at 12/16/21 7939   PTA Medications: Medications Prior to Admission  Medication Sig Dispense Refill Last Dose   acetaminophen (TYLENOL) 500 MG tablet Take 500 mg by mouth every 6 (six) hours as needed for headache, fever, moderate pain or mild pain.      albuterol (VENTOLIN HFA) 108 (90 Base) MCG/ACT inhaler Inhale 1-2 puffs into the lungs every 4 (four) hours as needed for cough or wheezing.  cyclobenzaprine (FLEXERIL) 10 MG tablet Take 1 tablet (10 mg total) by mouth 3 (three) times daily as needed for muscle spasms. 30 tablet 0    denosumab (PROLIA) 60 MG/ML SOSY injection Inject 60 mg into the skin every 6 (six) months.      diazepam (VALIUM) 2 MG tablet TAKE 1 TABLET (2 MG TOTAL) BY MOUTH DAILY AS NEEDED FOR ANXIETY. FOR SEVERE PANIC SYMPTOMS 15 tablet 0    [EXPIRED] doxycycline (VIBRA-TABS) 100 MG tablet Take 1 tablet (100 mg total) by mouth every 12 (twelve) hours for 4 days. 8 tablet 0    EPINEPHrine 0.3 mg/0.3 mL IJ SOAJ injection Inject 0.3 mg into the muscle as needed for anaphylaxis.      fluticasone (FLONASE) 50 MCG/ACT nasal spray Place 2 sprays into both nostrils daily. 9.9 mL 0    Fluticasone-Umeclidin-Vilant (TRELEGY ELLIPTA) 100-62.5-25 MCG/INH AEPB Inhale 1 puff into the lungs daily.      hydrocortisone 1 % lotion Apply topically 3 (three) times daily as needed  for itching. 118 mL 0    ipratropium (ATROVENT) 0.02 % nebulizer solution Take 0.25 mg by nebulization 4 (four) times daily.      lidocaine (LIDODERM) 5 % Place 1 patch onto the skin daily for 14 days. Remove & Discard patch within 12 hours or as directed by MD 14 patch 0    [EXPIRED] predniSONE (DELTASONE) 20 MG tablet Take 1 tablet (20 mg total) by mouth daily with breakfast for 3 days. 3 tablet 0    sodium chloride 1 g tablet Take 1 tablet (1 g total) by mouth 2 (two) times daily with a meal for 7 days. 14 tablet 0     Patient Stressors:    Patient Strengths:    Treatment Modalities: Medication Management, Group therapy, Case management,  1 to 1 session with clinician, Psychoeducation, Recreational therapy.   Physician Treatment Plan for Primary Diagnosis: Major depressive disorder, recurrent severe without psychotic features ([place]) Long Term Goal(s): Improvement in symptoms so as ready for discharge   Short Term Goals: Ability to identify changes in lifestyle to reduce recurrence of condition will improve Ability to verbalize feelings will improve Ability to disclose and discuss suicidal ideas Ability to demonstrate self-control will improve Ability to identify and develop effective coping behaviors will improve Ability to maintain clinical measurements within normal limits will improve Compliance with prescribed medications will improve Ability to identify triggers associated with substance abuse/mental health issues will improve  Medication Management: Evaluate patient's response, side effects, and tolerance of medication regimen.  Therapeutic Interventions: 1 to 1 sessions, Unit Group sessions and Medication administration.  Evaluation of Outcomes: Not Met  Physician Treatment Plan for Secondary Diagnosis: Principal Problem:   Major depressive disorder, recurrent severe without psychotic features ([place])  Long Term Goal(s): Improvement in symptoms so as ready for discharge    Short Term Goals: Ability to identify changes in lifestyle to reduce recurrence of condition will improve Ability to verbalize feelings will improve Ability to disclose and discuss suicidal ideas Ability to demonstrate self-control will improve Ability to identify and develop effective coping behaviors will improve Ability to maintain clinical measurements within normal limits will improve Compliance with prescribed medications will improve Ability to identify triggers associated with substance abuse/mental health issues will improve     Medication Management: Evaluate patient's response, side effects, and tolerance of medication regimen.  Therapeutic Interventions: 1 to 1 sessions, Unit Group sessions and Medication administration.  Evaluation of Outcomes: Not Met  RN Treatment Plan for Primary Diagnosis: Major depressive disorder, recurrent severe without psychotic features ([place]) Long Term Goal(s): Knowledge of disease and therapeutic regimen to maintain health will improve  Short Term Goals: Ability to remain free from injury will improve, Ability to demonstrate self-control, Ability to participate in decision making will improve, Ability to verbalize feelings will improve, Ability to disclose and discuss suicidal ideas, Ability to identify and develop effective coping behaviors will improve, and Compliance with prescribed medications will improve  Medication Management: RN will administer medications as ordered by provider, will assess and evaluate patient's response and provide education to patient for prescribed medication. RN will report any adverse and/or side effects to prescribing provider.  Therapeutic Interventions: 1 on 1 counseling sessions, Psychoeducation, Medication administration, Evaluate responses to treatment, Monitor vital signs and CBGs as ordered, Perform/monitor CIWA, COWS, AIMS and Fall Risk screenings as ordered, Perform wound care treatments as  ordered.  Evaluation of Outcomes: Not Met   LCSW Treatment Plan for Primary Diagnosis: Major depressive disorder, recurrent severe without psychotic features ([place]) Long Term Goal(s): Safe transition to appropriate next level of care at discharge, Engage patient in therapeutic group addressing interpersonal concerns.  Short Term Goals: Engage patient in aftercare planning with referrals and resources, Increase social support, Increase ability to appropriately verbalize feelings, Increase emotional regulation, Identify triggers associated with mental health/substance abuse issues, and Increase skills for wellness and recovery  Therapeutic Interventions: Assess for all discharge needs, 1 to 1 time with Social worker, Explore available resources and support systems, Assess for adequacy in community support network, Educate family and significant other(s) on suicide prevention, Complete Psychosocial Assessment, Interpersonal group therapy.  Evaluation of Outcomes: Not Met   Progress in Treatment: Attending groups: Yes. Participating in groups: Yes. Taking medication as prescribed: Yes. Toleration medication: Yes. Family/Significant other contact made: Yes, individual(s) contacted:  pt's husband Patient understands diagnosis: Yes. Discussing patient identified problems/goals with staff: Yes. Medical problems stabilized or resolved: Yes. Denies suicidal/homicidal ideation: Yes. Issues/concerns per patient self-inventory: No. Other: None  New problem(s) identified: No, Describe:  None  New Short Term/Long Term Goal(s): Patient to work towards medication management for mood stabilization; elimination of SI thoughts; development of comprehensive mental wellness plan.   Patient Goals:  Pt stated that they wanted to get medications organized and "get sleep."  Discharge Plan or Barriers: CSW will assist pt with development of an appropriate discharge/aftercare plan.   Reason for Continuation  of Hospitalization: Anxiety Depression Medication stabilization  Estimated Length of Stay: 1-7 days   Scribe for Treatment Team: Yailene Badia A [place], Latanya Presser 12/16/2021 10:54 AM

## 2021-12-16 NOTE — Progress Notes (Signed)
Recreation Therapy Notes  INPATIENT RECREATION TR PLAN  Patient Details Name: Colleen [name] MRN: [id number] DOB: [birth year] Today's Date: 12/16/2021  Rec Therapy Plan Is patient appropriate for Therapeutic Recreation?: Yes Treatment times per week: at least 3 Estimated Length of Stay: 5-7 days TR Treatment/Interventions: Group participation (Comment)  Discharge Criteria Pt will be discharged from therapy if:: Discharged Treatment plan/goals/alternatives discussed and agreed upon by:: Patient/family  Discharge Summary     [name] 12/16/2021, 3:51 PM

## 2021-12-16 NOTE — Progress Notes (Signed)
Patient was coughing  c/o back pain and sinus drainage  anxiety of 9/10 Provider notified OTO of valium 2 mg given 0154, sodium chloride for post nasal drip and Cepacol lozenge for cough. Patient encouraged to use her oxygen and refused. Patient managed to sleep with fewer episodes of coughing. This morning Patient said she is suppose to use C Pap when she is in bed but does not use it.  Support and encouragement Provided. Patient remains safe. Otherwise in no distress this morning.

## 2021-12-16 NOTE — Plan of Care (Addendum)
Patient presents A&O x 4. Patient affect is anxious and depressed. Denies AVH, SI or HI.  Endorses anxiety 8/10 and depression 5/10.  Rates lower back pain 4/10.  Flexeril given for back pain and Valium given for anxiety with relief reported.  VSS.  Patient compliant with all scheduled meds.  O2 sats on room air 98%.  No oxygen needed at this time - no 1:1 required.  Problem: Education: Goal: Knowledge of [place] [facility] Education information/materials will improve Outcome: Progressing Goal: Emotional status will improve Outcome: Progressing Goal: Mental status will improve Outcome: Progressing   Problem: Activity: Goal: Interest or engagement in activities will improve Outcome: Progressing   Problem: Safety: Goal: Periods of time without injury will increase Outcome: Progressing

## 2021-12-16 NOTE — Progress Notes (Signed)
Recreation Therapy Notes  INPATIENT RECREATION THERAPY ASSESSMENT  Patient Details Name: Diera Wirkkala MRN: [id number] DOB: July 06, 1961 Today's Date: 12/16/2021       Information Obtained From: Patient  Able to Participate in Assessment/Interview: Yes  Patient Presentation: Responsive  Reason for Admission (Per Patient): Active Symptoms, Other (Comments) (Pain)  Patient Stressors:    Coping Skills:   Talk, TV, Exercise  Leisure Interests (2+):  Exercise - Walking, Individual - Reading, Art - Coloring, Individual - TV, Music - Listen  Frequency of Recreation/Participation: Monthly  Awareness of Community Resources:  No  Community Resources:     Current Use:    If no, Barriers?:    Expressed Interest in [employer] Information: Yes  County of Residence:  [place]  Patient Main Form of Transportation: Car  Patient Strengths:  Big heart  Patient Identified Areas of Improvement:  Have mind clear  Patient Goal for Hospitalization:  Learn how to manage my pain better  Current SI (including self-harm):  No  Current HI:  No  Current AVH: No  Staff Intervention Plan: Group Attendance, Collaborate with Interdisciplinary Treatment Team  Consent to Intern Participation: N/A  Missey Hasley 12/16/2021, 3:50 PM

## 2021-12-16 NOTE — Progress Notes (Signed)
[facility] MD Progress Note  12/16/2021 11:07 AM Colleen [name]  MRN:  [id number] Subjective: This is my first meeting with Colleen [name].  She has a history of chronic back pain which interferes with her sleep.  She sees a psychiatrist outpatient in [place].  She also has a pain doctor and an upcoming appointment with neurology.  I started her on Remeron at bedtime.  On-call doctor started her on Celexa in the daytime.  She states that she is tolerating the medications and denies any side effects.  She did take as needed Seroquel last night but states she still had trouble sleeping.  Principal Problem: Major depressive disorder, recurrent severe without psychotic features ([place]) Diagnosis: Principal Problem:   Major depressive disorder, recurrent severe without psychotic features ([place])  Total Time spent with patient: 15 minutes  Past Psychiatric History: Dr. Shea Evans  Past Medical History:  Past Medical History:  Diagnosis Date   Anxiety    Asthma    Breast cancer ([place]) 2004   left breast   Chronic back pain    COPD (chronic obstructive pulmonary disease) ([place])    Depression    Personal history of radiation therapy     Past Surgical History:  Procedure Laterality Date   BREAST BIOPSY Left 2004   positive   BREAST BIOPSY Right    neg   BREAST LUMPECTOMY Left 2004   positive   LEFT HEART CATH AND CORONARY ANGIOGRAPHY N/A 11/06/2021   Procedure: LEFT HEART CATH AND CORONARY ANGIOGRAPHY;  Surgeon: Corey Skains, MD;  Location: [facility];  Service: Cardiovascular;  Laterality: N/A;   MASTECTOMY Left    MASTECTOMY     NEPHRECTOMY Left    TUBAL LIGATION     Family History:  Family History  Problem Relation Age of Onset   Breast cancer Cousin    Breast cancer Cousin    Bipolar disorder Daughter    Drug abuse Daughter    Alcohol abuse Maternal Grandfather     Social History:  Social History   Substance and Sexual Activity  Alcohol Use Never     Social History    Substance and Sexual Activity  Drug Use Never   Comment: prescribed muscle relaxers and valium    Social History   Socioeconomic History   Marital status: Married    Spouse name: Not on file   Number of children: 2   Years of education: GED   Highest education level: Not on file  Occupational History   Not on file  Tobacco Use   Smoking status: Every Day    Packs/day: 0.50    Types: Cigarettes    Passive exposure: Never   Smokeless tobacco: Never  Vaping Use   Vaping Use: Never used  Substance and Sexual Activity   Alcohol use: Never   Drug use: Never    Comment: prescribed muscle relaxers and valium   Sexual activity: Not Currently  Other Topics Concern   Not on file  Social History Narrative   Not on file   Social Determinants of Health   Financial Resource Strain: Not on file  Food Insecurity: Not on file  Transportation Needs: Not on file  Physical Activity: Not on file  Stress: Not on file  Social Connections: Not on file   Additional Social History:                         Sleep: Poor  Appetite:  Fair  Current  Medications: Current Facility-Administered Medications  Medication Dose Route Frequency Provider Last Rate Last Admin   acetaminophen (TYLENOL) tablet 650 mg  650 mg Oral Q6H PRN Patrecia Pour, NP   650 mg at 12/16/21 0550   albuterol (VENTOLIN HFA) 108 (90 Base) MCG/ACT inhaler 1-2 puff  1-2 puff Inhalation Q4H PRN Patrecia Pour, NP   2 puff at 12/16/21 0956   alum & mag hydroxide-simeth (MAALOX/MYLANTA) 200-200-20 MG/5ML suspension 30 mL  30 mL Oral Q4H PRN Patrecia Pour, NP       citalopram (CELEXA) tablet 10 mg  10 mg Oral Daily [name], MD   10 mg at 12/16/21 0956   cyclobenzaprine (FLEXERIL) tablet 10 mg  10 mg Oral TID PRN Patrecia Pour, NP   10 mg at 12/15/21 2251   diazepam (VALIUM) tablet 2 mg  2 mg Oral Daily PRN Patrecia Pour, NP   2 mg at 12/15/21 1039   doxycycline (VIBRA-TABS) tablet 100 mg  100 mg Oral  Q12H Patrecia Pour, NP   100 mg at 12/16/21 0958   fluticasone furoate-vilanterol (BREO ELLIPTA) 100-25 MCG/ACT 1 puff  1 puff Inhalation Daily Patrecia Pour, NP   1 puff at 12/16/21 0735   And   umeclidinium bromide (INCRUSE ELLIPTA) 62.5 MCG/ACT 1 puff  1 puff Inhalation Daily Patrecia Pour, NP   1 puff at 12/16/21 0736   guaiFENesin (MUCINEX) 12 hr tablet 600 mg  600 mg Oral BID PRN [name], NP   600 mg at 12/15/21 2115   hydrocortisone 1 % lotion   Topical TID PRN Patrecia Pour, NP       lidocaine (LIDODERM) 5 % 2 patch  2 patch Transdermal Q24H [name], MD   2 patch at 12/15/21 2300   magnesium hydroxide (MILK OF MAGNESIA) suspension 30 mL  30 mL Oral Daily PRN Patrecia Pour, NP       menthol-cetylpyridinium (CEPACOL) lozenge 3 mg  1 lozenge Oral PRN [name], MD   3 mg at 12/16/21 0123   mirtazapine (REMERON) tablet 15 mg  15 mg Oral QHS [name], DO   15 mg at 12/15/21 2115   predniSONE (DELTASONE) tablet 20 mg  20 mg Oral Q breakfast Patrecia Pour, NP   20 mg at 12/16/21 7169   QUEtiapine (SEROQUEL) tablet 200 mg  200 mg Oral QHS [name], DO       sodium chloride (OCEAN) 0.65 % nasal spray 1 spray  1 spray Each Nare PRN [name], MD   1 spray at 12/16/21 0123   sodium chloride tablet 1 g  1 g Oral BID WC Patrecia Pour, NP   1 g at 12/16/21 6789    Lab Results: No results found for this or any previous visit (from the past 48 hour(s)).  Blood Alcohol level:  Lab Results  Component Value Date   ETH <10 12/13/2021   ETH <10 38/08/1750    Metabolic Disorder Labs: Lab Results  Component Value Date   HGBA1C 5.4 10/12/2018   MPG 108.28 10/12/2018   No results found for: PROLACTIN Lab Results  Component Value Date   CHOL 180 11/05/2021   TRIG 49 11/05/2021   HDL 67 11/05/2021   CHOLHDL 2.7 11/05/2021   VLDL 10 11/05/2021   LDLCALC 103 (H) 11/05/2021    Physical Findings: AIMS:  , ,  ,  ,    CIWA:  COWS:      Musculoskeletal: Strength & Muscle Tone: within normal limits Gait & Station: normal Patient leans: N/A  Psychiatric Specialty Exam:  Presentation  General Appearance: Appropriate for Environment  Eye Contact:Good  Speech:Clear and Coherent  Speech Volume:Normal  Handedness:Right   Mood and Affect  Mood:Anxious (Stated)  Affect:Appropriate   Thought Process  Thought Processes:Coherent  Descriptions of Associations:Intact  Orientation:Full (Time, Place and Person)  Thought Content:WDL  History of Schizophrenia/Schizoaffective disorder:No  Duration of Psychotic Symptoms:No data recorded Hallucinations:No data recorded Ideas of Reference:None  Suicidal Thoughts:No data recorded Homicidal Thoughts:No data recorded  Sensorium  Memory:Immediate Good  Judgment:Fair  Insight:Fair   Executive Functions  Concentration:Good  Attention Span:Good  [place]  Language:Good   Psychomotor Activity  Psychomotor Activity:No data recorded  Assets  Assets:Communication Skills; Resilience; Financial Resources/Insurance; Housing; Intimacy   Sleep  Sleep:No data recorded   Physical Exam: Physical Exam Vitals and nursing note reviewed.  Constitutional:      Appearance: Normal appearance. She is normal weight.  Neurological:     General: No focal deficit present.     Mental Status: She is alert and oriented to person, place, and time.  Psychiatric:        Attention and Perception: Attention and perception normal.        Mood and Affect: Affect normal. Mood is depressed.        Speech: Speech normal.        Behavior: Behavior normal. Behavior is cooperative.        Thought Content: Thought content normal.        Cognition and Memory: Cognition and memory normal.        Judgment: Judgment normal.   Review of Systems  Musculoskeletal:  Positive for back pain.  Psychiatric/Behavioral:  Positive for depression. The patient  has insomnia.   Blood pressure (!) 134/97, pulse 82, temperature 97.9 F (36.6 C), temperature source Oral, resp. rate 18, height 5' (1.524 m), weight 55.8 kg, SpO2 98 %. Body mass index is 24.02 kg/m.   Treatment Plan Summary: Daily contact with patient to assess and evaluate symptoms and progress in treatment, Medication management, and Plan increase Seroquel and schedule it.  [place], DO 12/16/2021, 11:08 AM

## 2021-12-16 NOTE — Progress Notes (Signed)
Recreation Therapy Notes   Date: 12/16/2021  Time: 1:25pm   Location: Courtyard    Behavioral response: Appropriate  Intervention Topic: Leisure   Discussion/Intervention:  Group content today was focused on leisure. The group defined what leisure is and some positive leisure activities they participate in. Individuals identified the difference between good and bad leisure. Participants expressed how they feel after participating in the leisure of their choice. The group discussed how they go about picking a leisure activity and if others are involved in their leisure activities. The patient stated how many leisure activities they have to choose from and reasons why it is important to have leisure time. Individuals participated in the intervention Exploration of Leisure where they had a chance to identify new leisure activities as well as benefits of leisure. Clinical Observations/Feedback: Patient came to group and identified listening to music, time with her puppies and coloring as leisure activities she enjoys. Individual participated in the intervention and was able to focus on the topic at hand.  Yulonda Wheeling LRT/CTRS         Arcenio Mullaly 12/16/2021 4:05 PM

## 2021-12-17 DIAGNOSIS — F332 Major depressive disorder, recurrent severe without psychotic features: Secondary | ICD-10-CM | POA: Diagnosis not present

## 2021-12-17 NOTE — Progress Notes (Addendum)
Patient denies SI, HI, and AVH. She endorses some back pain but denies needing any PRN medication at this time. She states that the lidocaine patches at night have helped with the back pain. Patient is concerned about her allergies and taking new medication. Patient says that she had been taking Gabapentin for about 9 months before she started developing an allergy to it. Patient was educated on her medications and encouraged to keep an eye on any allergic reactions. Patient is in better spirits and is interacting appropriately with staff and other patients on the unit. Patient ate breakfast in the dayroom and is talking to other patients  and watching TV. Patient remains safe on the unit at this time.  Patient stated that she wanted to take her Breo and Incruse at night. Medications were held. MD notified and agreed to giving them at night.

## 2021-12-17 NOTE — Plan of Care (Signed)
Pt visible in the milieu socializing with peers. Pt cooperative. Pt endorses anxiety 4/10. Prn valium given with relief. Pt denies SI/HI/AVH. Q15 mins safety checks maintained. Pt reports that she is doing well. Pt given prn medications for cough. Relief noted. Will continue to monitor.  Problem: Education: Goal: Knowledge of [facility] [facility] Education information/materials will improve Outcome: Progressing Goal: Emotional status will improve Outcome: Progressing Goal: Mental status will improve Outcome: Progressing   Problem: Activity: Goal: Interest or engagement in activities will improve Outcome: Progressing Goal: Sleeping patterns will improve Outcome: Progressing   Problem: Coping: Goal: Ability to verbalize frustrations and anger appropriately will improve Outcome: Progressing Goal: Ability to demonstrate self-control will improve Outcome: Progressing   Problem: Safety: Goal: Periods of time without injury will increase Outcome: Progressing

## 2021-12-17 NOTE — Progress Notes (Signed)
[facility] MD Progress Note  12/17/2021 11:52 AM Colleen [name]  MRN:  [id number] Subjective: Colleen [name] is doing better today.  She was started on a lidocaine patch by the doctor on-call this weekend and it has helped her back pain.  She is also sleeping better since starting Seroquel, and her mood is better on Celexa.  She sees a psychiatrist (Dr. Shea Evans) by tele-psych.  She denies any suicidal ideation.  Talked about discharge tomorrow.  She has an ENT appointment tomorrow afternoon.  Principal Problem: Major depressive disorder, recurrent severe without psychotic features ([place]) Diagnosis: Principal Problem:   Major depressive disorder, recurrent severe without psychotic features ([place])  Total Time spent with patient: 15 minutes  Past Psychiatric History: Dr. Shea Evans  Past Medical History:  Past Medical History:  Diagnosis Date   Anxiety    Asthma    Breast cancer ([place]) 2004   left breast   Chronic back pain    COPD (chronic obstructive pulmonary disease) ([place])    Depression    Personal history of radiation therapy     Past Surgical History:  Procedure Laterality Date   BREAST BIOPSY Left 2004   positive   BREAST BIOPSY Right    neg   BREAST LUMPECTOMY Left 2004   positive   LEFT HEART CATH AND CORONARY ANGIOGRAPHY N/A 11/06/2021   Procedure: LEFT HEART CATH AND CORONARY ANGIOGRAPHY;  Surgeon: Corey Skains, MD;  Location: [facility];  Service: Cardiovascular;  Laterality: N/A;   MASTECTOMY Left    MASTECTOMY     NEPHRECTOMY Left    TUBAL LIGATION     Family History:  Family History  Problem Relation Age of Onset   Breast cancer Cousin    Breast cancer Cousin    Bipolar disorder Daughter    Drug abuse Daughter    Alcohol abuse Maternal Grandfather     Social History:  Social History   Substance and Sexual Activity  Alcohol Use Never     Social History   Substance and Sexual Activity  Drug Use Never   Comment: prescribed muscle relaxers and valium     Social History   Socioeconomic History   Marital status: Married    Spouse name: Not on file   Number of children: 2   Years of education: GED   Highest education level: Not on file  Occupational History   Not on file  Tobacco Use   Smoking status: Every Day    Packs/day: 0.50    Types: Cigarettes    Passive exposure: Never   Smokeless tobacco: Never  Vaping Use   Vaping Use: Never used  Substance and Sexual Activity   Alcohol use: Never   Drug use: Never    Comment: prescribed muscle relaxers and valium   Sexual activity: Not Currently  Other Topics Concern   Not on file  Social History Narrative   Not on file   Social Determinants of Health   Financial Resource Strain: Not on file  Food Insecurity: Not on file  Transportation Needs: Not on file  Physical Activity: Not on file  Stress: Not on file  Social Connections: Not on file   Additional Social History:                         Sleep: Good  Appetite:  Good  Current Medications: Current Facility-Administered Medications  Medication Dose Route Frequency Provider Last Rate Last Admin   acetaminophen (TYLENOL) tablet 650  mg  650 mg Oral Q6H PRN Patrecia Pour, NP   650 mg at 12/16/21 2249   albuterol (VENTOLIN HFA) 108 (90 Base) MCG/ACT inhaler 1-2 puff  1-2 puff Inhalation Q4H PRN Patrecia Pour, NP   2 puff at 12/16/21 2117   alum & mag hydroxide-simeth (MAALOX/MYLANTA) 200-200-20 MG/5ML suspension 30 mL  30 mL Oral Q4H PRN Patrecia Pour, NP       citalopram (CELEXA) tablet 10 mg  10 mg Oral Daily [name], MD   10 mg at 12/17/21 0757   cyclobenzaprine (FLEXERIL) tablet 10 mg  10 mg Oral TID PRN Patrecia Pour, NP   10 mg at 12/17/21 0138   diazepam (VALIUM) tablet 2 mg  2 mg Oral Daily PRN Patrecia Pour, NP   2 mg at 12/17/21 0138   doxycycline (VIBRA-TABS) tablet 100 mg  100 mg Oral Q12H Patrecia Pour, NP   100 mg at 12/17/21 0754   fluticasone furoate-vilanterol (BREO ELLIPTA)  100-25 MCG/ACT 1 puff  1 puff Inhalation Daily Patrecia Pour, NP   1 puff at 12/16/21 0735   And   umeclidinium bromide (INCRUSE ELLIPTA) 62.5 MCG/ACT 1 puff  1 puff Inhalation Daily Patrecia Pour, NP   1 puff at 12/16/21 0736   guaiFENesin (MUCINEX) 12 hr tablet 600 mg  600 mg Oral BID PRN [name], NP   600 mg at 12/16/21 2256   hydrocortisone 1 % lotion   Topical TID PRN Patrecia Pour, NP       lidocaine (LIDODERM) 5 % 2 patch  2 patch Transdermal Q24H [name], MD   2 patch at 12/16/21 2234   magnesium hydroxide (MILK OF MAGNESIA) suspension 30 mL  30 mL Oral Daily PRN Patrecia Pour, NP       menthol-cetylpyridinium (CEPACOL) lozenge 3 mg  1 lozenge Oral PRN [name], MD   3 mg at 12/16/21 0123   mirtazapine (REMERON) tablet 15 mg  15 mg Oral QHS [name], DO   15 mg at 12/16/21 2112   predniSONE (DELTASONE) tablet 20 mg  20 mg Oral Q breakfast Patrecia Pour, NP   20 mg at 12/17/21 0754   QUEtiapine (SEROQUEL) tablet 200 mg  200 mg Oral QHS [name], DO   200 mg at 12/16/21 2114   sodium chloride (OCEAN) 0.65 % nasal spray 1 spray  1 spray Each Nare PRN [name], MD   1 spray at 12/16/21 2119   sodium chloride tablet 1 g  1 g Oral BID WC Patrecia Pour, NP   1 g at 12/17/21 6767    Lab Results: No results found for this or any previous visit (from the past 48 hour(s)).  Blood Alcohol level:  Lab Results  Component Value Date   ETH <10 12/13/2021   ETH <10 20/94/7096    Metabolic Disorder Labs: Lab Results  Component Value Date   HGBA1C 5.4 10/12/2018   MPG 108.28 10/12/2018   No results found for: PROLACTIN Lab Results  Component Value Date   CHOL 180 11/05/2021   TRIG 49 11/05/2021   HDL 67 11/05/2021   CHOLHDL 2.7 11/05/2021   VLDL 10 11/05/2021   LDLCALC 103 (H) 11/05/2021    Physical Findings: AIMS:  , ,  ,  ,    CIWA:    COWS:     Musculoskeletal: Strength & Muscle Tone: within normal limits Gait &  Station:  normal Patient leans: N/A  Psychiatric Specialty Exam:  Presentation  General Appearance: Appropriate for Environment  Eye Contact:Good  Speech:Clear and Coherent  Speech Volume:Normal  Handedness:Right   Mood and Affect  Mood:Anxious (Stated)  Affect:Appropriate   Thought Process  Thought Processes:Coherent  Descriptions of Associations:Intact  Orientation:Full (Time, Place and Person)  Thought Content:WDL  History of Schizophrenia/Schizoaffective disorder:No  Duration of Psychotic Symptoms:No data recorded Hallucinations:No data recorded Ideas of Reference:None  Suicidal Thoughts:No data recorded Homicidal Thoughts:No data recorded  Sensorium  Memory:Immediate Good  Judgment:Fair  Insight:Fair   Executive Functions  Concentration:Good  Attention [place]  Language:Good   Psychomotor Activity  Psychomotor Activity:No data recorded  Assets  Assets:Communication Skills; Resilience; Financial Resources/Insurance; Housing; Intimacy   Sleep  Sleep:No data recorded   Physical Exam: Physical Exam Vitals and nursing note reviewed.  Constitutional:      Appearance: Normal appearance. She is normal weight.  Neurological:     General: No focal deficit present.     Mental Status: She is alert and oriented to person, place, and time.  Psychiatric:        Attention and Perception: Attention and perception normal.        Mood and Affect: Mood and affect normal.        Speech: Speech normal.        Behavior: Behavior normal. Behavior is cooperative.        Thought Content: Thought content normal.        Cognition and Memory: Cognition and memory normal.        Judgment: Judgment normal.   Review of Systems  Constitutional: Negative.   HENT: Negative.    Eyes: Negative.   Respiratory: Negative.    Cardiovascular: Negative.   Gastrointestinal: Negative.   Genitourinary: Negative.    Musculoskeletal: Negative.   Skin: Negative.   Neurological: Negative.   Endo/Heme/Allergies: Negative.   Psychiatric/Behavioral:  Positive for depression. The patient has insomnia.   Blood pressure (!) 133/101, pulse 91, temperature 98.5 F (36.9 C), temperature source Oral, resp. rate 18, height 5' (1.524 m), weight 55.8 kg, SpO2 92 %. Body mass index is 24.02 kg/m.   Treatment Plan Summary: Daily contact with patient to assess and evaluate symptoms and progress in treatment, Medication management, and Plan continue current medications.  Discharge tomorrow morning.  [name], DO 12/17/2021, 11:52 AM

## 2021-12-17 NOTE — Progress Notes (Signed)
Recreation Therapy Notes  Date: 12/17/2021   Time: 1:20 pm     Location: Craft room     Behavioral response: Appropriate   Intervention Topic: Goals    Discussion/Intervention:  Group content on today was focused on goals. Patients described what goals are and how they define goals. Individuals expressed how they go about setting goals and reaching them. The group identified how important goals are and if they make short term goals to reach long term goals. Patients described how many goals they work on at a time and what affects them not reaching their goal. Individuals described how much time they put into planning and obtaining their goals. The group participated in the intervention My Goal Board and made personal goal boards to help them achieve their goal. Clinical Observations/Feedback: Patient came to group and identified that her goals are to walk more, cook, and organize things. She stated that goals are important to have something to look forward to and have a plan. Individual was social with peers and staff while participating in the intervention.  Colleen [name] LRT/CTRS         Zubayr Bednarczyk 12/17/2021 3:42 PM

## 2021-12-17 NOTE — B[facility] Counselor (Signed)
CSW spoke with pt's husband regarding discharge. CSW said staff would try to have pt ready by late morning so that she can make her neurology appointment in the afternoon.Pt's husband stated he could pick up pt around 10/11 AM.    Rickita Forstner [place], MSW, LCSW-A 2/7/20233:54 PM

## 2021-12-18 DIAGNOSIS — F332 Major depressive disorder, recurrent severe without psychotic features: Secondary | ICD-10-CM | POA: Diagnosis not present

## 2021-12-18 LAB — HEMOGLOBIN A1C
Hgb A1c MFr Bld: 5.8 % — ABNORMAL HIGH (ref 4.8–5.6)
Mean Plasma Glucose: 119.76 mg/dL

## 2021-12-18 MED ORDER — MIRTAZAPINE 15 MG PO TABS: 15.0000 mg | ORAL_TABLET | Freq: Every day | ORAL | 2 refills | Status: DC

## 2021-12-18 MED ORDER — QUETIAPINE FUMARATE 200 MG PO TABS: 200.0000 mg | ORAL_TABLET | Freq: Every day | ORAL | 2 refills | Status: DC

## 2021-12-18 MED ORDER — CITALOPRAM HYDROBROMIDE 10 MG PO TABS: 10.0000 mg | ORAL_TABLET | Freq: Every day | ORAL | 2 refills | Status: DC

## 2021-12-18 MED ORDER — LIDOCAINE 5 % EX PTCH: 1.0000 | MEDICATED_PATCH | CUTANEOUS | 0 refills | Status: AC

## 2021-12-18 MED ORDER — DIAZEPAM 2 MG PO TABS: 2.0000 mg | ORAL_TABLET | Freq: Every day | ORAL | 0 refills | Status: DC | PRN

## 2021-12-18 NOTE — Progress Notes (Signed)
Patient alert and oriented x 4.  Affect is pleasant and calm. Patient rates anxiety and depression a 2/10 and states its related to her going home. Denies  SI/HI or AVH.  Patient states she will try to keep herself safe when she returns home.  Reviewed discharge instructions with patient including medication and prescriptions.  Questions answered and understanding verbalized.  Discharge packet given to patient.  All belongings returned to patient after verification completed by staff and patient.    Patient escorted by staff off unit at this time stable without complaint.

## 2021-12-18 NOTE — Progress Notes (Signed)
Pt sitting in day room; calm, cooperative. Pt states that she feels "a little blue" because her husband told her that the veterinarian said that their dog is "losing fluid behind his eye and is going to be blind." She c/o low back pain, which she rates 9/10 on 0-10 pain scale. Pt denies SI/HI/AVH at this time. Pt states "I'm having a panic attack", closed her eyes and attempted to calm herself, which she did. She states that her panic attacks are sudden, coming on at any given time. She says that she has trouble falling asleep and staying asleep due to pain; she describes her appetite as "pretty good'. She expresses feelings of anxiety, which she rates 8-9/10 and does not know the reason. She says that she is currently depressed because of her dog; she rates her depression 7/10. Pt states that she is supposed to be discharged home tomorrow. No acute distress noted at this time.

## 2021-12-18 NOTE — BHH Suicide Risk Assessment (Signed)
[facility] Discharge Suicide Risk Assessment   Principal Problem: Major depressive disorder, recurrent severe without psychotic features ([place]) Discharge Diagnoses: Principal Problem:   Major depressive disorder, recurrent severe without psychotic features ([place])   Total Time spent with patient: 1 hour  Musculoskeletal: Strength & Muscle Tone: within normal limits Gait & Station: normal Patient leans: N/A  Psychiatric Specialty Exam  Presentation  General Appearance: Appropriate for Environment  Eye Contact:Good  Speech:Clear and Coherent  Speech Volume:Normal  Handedness:Right   Mood and Affect  Mood:Anxious (Stated)  Duration of Depression Symptoms: Greater than two weeks  Affect:Appropriate   Thought Process  Thought Processes:Coherent  Descriptions of Associations:Intact  Orientation:Full (Time, Place and Person)  Thought Content:WDL  History of Schizophrenia/Schizoaffective disorder:No  Duration of Psychotic Symptoms:No data recorded Hallucinations:No data recorded Ideas of Reference:None  Suicidal Thoughts:No data recorded Homicidal Thoughts:No data recorded  Sensorium  Memory:Immediate Good  Judgment:Fair  Insight:Fair   Executive Functions  Concentration:Good  Attention Span:Good  [place]  Language:Good   Psychomotor Activity  Psychomotor Activity:No data recorded  Assets  Assets:Communication Skills; Resilience; Financial Resources/Insurance; Housing; Intimacy   Sleep  Sleep:No data recorded  Physical Exam: Physical Exam Vitals and nursing note reviewed.  Constitutional:      Appearance: Normal appearance. She is normal weight.  Neurological:     General: No focal deficit present.     Mental Status: She is alert and oriented to person, place, and time.  Psychiatric:        Attention and Perception: Attention and perception normal.        Mood and Affect: Mood and affect normal.        Speech:  Speech normal.        Behavior: Behavior normal. Behavior is cooperative.        Thought Content: Thought content normal.        Cognition and Memory: Cognition and memory normal.        Judgment: Judgment normal.   Review of Systems  Constitutional: Negative.   HENT: Negative.    Eyes: Negative.   Respiratory: Negative.    Cardiovascular: Negative.   Gastrointestinal: Negative.   Genitourinary: Negative.   Musculoskeletal: Negative.   Skin: Negative.   Neurological: Negative.   Endo/Heme/Allergies: Negative.   Psychiatric/Behavioral: Negative.    Blood pressure (!) 129/91, pulse 88, temperature 98.1 F (36.7 C), temperature source Oral, resp. rate 18, height 5' (1.524 m), weight 55.8 kg, SpO2 95 %. Body mass index is 24.02 kg/m.  Mental Status Per Nursing Assessment::   On Admission:  NA (WDL)  Demographic Factors:  Caucasian  Loss Factors: Decline in physical health  Historical Factors: NA  Risk Reduction Factors:   NA  Continued Clinical Symptoms:  Chronic Pain  Cognitive Features That Contribute To Risk:  None    Suicide Risk:  Minimal: No identifiable suicidal ideation.  Patients presenting with no risk factors but with morbid ruminations; may be classified as minimal risk based on the severity of the depressive symptoms   Follow-up Information     [facility] Follow up.   Why: Please contact [facility] to set up your appointment with Dr. Neysa Bonito.  You have an appointment scheduled with Shary Decamp on Tuesday at [place]. Thanks! Contact information: Main Office [address] [place] [address] Phone: [phone]                Plan Of Care/Follow-up recommendations: Dr. Azzie Almas, DO 12/18/2021,  9:36 AM

## 2021-12-18 NOTE — Discharge Summary (Signed)
Physician Discharge Summary Note  Patient:  Colleen [name] is an 61 y.o., female MRN:  [id number] DOB:  Aug 06, 1961 Patient phone:  [phone] (home)  Patient address:   [place] [address],  Total Time spent with patient: 1 hour  Date of Admission:  12/13/2021 Date of Discharge: 12/18/2021  Reason for Admission:  Patient is 61yo F with psychiatric history of depression anxiety, medical history of osteoporosis, chronic pain, COPD/asthma, breast cancer, allergies, who was admitted to inpatient Geriatric psychiatry unit due to worsened depression and suicidal ideations.     Principal Problem: Major depressive disorder, recurrent severe without psychotic features [facility]) Discharge Diagnoses: Principal Problem:   Major depressive disorder, recurrent severe without psychotic features [facility])   Past Psychiatric History: Dr. Shea Evans  Past Medical History:  Past Medical History:  Diagnosis Date   Anxiety    Asthma    Breast cancer ([place]) 2004   left breast   Chronic back pain    COPD (chronic obstructive pulmonary disease) ([place])    Depression    Personal history of radiation therapy     Past Surgical History:  Procedure Laterality Date   BREAST BIOPSY Left 2004   positive   BREAST BIOPSY Right    neg   BREAST LUMPECTOMY Left 2004   positive   LEFT HEART CATH AND CORONARY ANGIOGRAPHY N/A 11/06/2021   Procedure: LEFT HEART CATH AND CORONARY ANGIOGRAPHY;  Surgeon: Corey Skains, MD;  Location: [facility];  Service: Cardiovascular;  Laterality: N/A;   MASTECTOMY Left    MASTECTOMY     NEPHRECTOMY Left    TUBAL LIGATION     Family History:  Family History  Problem Relation Age of Onset   Breast cancer Cousin    Breast cancer Cousin    Bipolar disorder Daughter    Drug abuse Daughter    Alcohol abuse Maternal Grandfather     Social History:  Social History   Substance and Sexual Activity  Alcohol Use Never     Social History   Substance and  Sexual Activity  Drug Use Never   Comment: prescribed muscle relaxers and valium    Social History   Socioeconomic History   Marital status: Married    Spouse name: Not on file   Number of children: 2   Years of education: GED   Highest education level: Not on file  Occupational History   Not on file  Tobacco Use   Smoking status: Every Day    Packs/day: 0.50    Types: Cigarettes    Passive exposure: Never   Smokeless tobacco: Never  Vaping Use   Vaping Use: Never used  Substance and Sexual Activity   Alcohol use: Never   Drug use: Never    Comment: prescribed muscle relaxers and valium   Sexual activity: Not Currently  Other Topics Concern   Not on file  Social History Narrative   Not on file   Social Determinants of Health   Financial Resource Strain: Not on file  Food Insecurity: Not on file  Transportation Needs: Not on file  Physical Activity: Not on file  Stress: Not on file  Social Connections: Not on file    Hospital Course: Nyilah was voluntarily admitted to inpatient geriatric psychiatry for worsening depression.  She was sent to the emergency room by her psychiatrist, Dr. Shea Evans.  She continued on her Valium and Remeron, Celexa, and lidocaine patches were started.  She has a history of chronic pain  which makes it very difficult for her to sleep and contributes to her pain.  She did well for the most part but had some trouble sleeping last night.  She tolerated the medications without any problems.  She denies any side effects.  It was felt that she maximized hospitalization she was discharged home.  On the day of discharge she denied suicidal ideation, homicidal ideation, auditory or visual hallucinations.  Her judgment and insight were good.  Physical Findings: AIMS:  , ,  ,  ,    CIWA:    COWS:     Musculoskeletal: Strength & Muscle Tone: within normal limits Gait & Station: normal Patient leans: N/A   Psychiatric Specialty Exam:  Presentation   General Appearance: Appropriate for Environment  Eye Contact:Good  Speech:Clear and Coherent  Speech Volume:Normal  Handedness:Right   Mood and Affect  Mood:Anxious (Stated)  Affect:Appropriate   Thought Process  Thought Processes:Coherent  Descriptions of Associations:Intact  Orientation:Full (Time, Place and Person)  Thought Content:WDL  History of Schizophrenia/Schizoaffective disorder:No  Duration of Psychotic Symptoms:No data recorded Hallucinations:No data recorded Ideas of Reference:None  Suicidal Thoughts:No data recorded Homicidal Thoughts:No data recorded  Sensorium  Memory:Immediate Good  Judgment:Fair  Insight:Fair   Executive Functions  Concentration:Good  Attention Span:Good  [place]  Language:Good   Psychomotor Activity  Psychomotor Activity:No data recorded  Assets  Assets:Communication Skills; Resilience; Financial Resources/Insurance; Housing; Intimacy   Sleep  Sleep:No data recorded   Physical Exam: Physical Exam Vitals and nursing note reviewed.  Constitutional:      Appearance: Normal appearance. She is normal weight.  Neurological:     General: No focal deficit present.     Mental Status: She is alert and oriented to person, place, and time.  Psychiatric:        Attention and Perception: Attention and perception normal.        Mood and Affect: Mood and affect normal.        Speech: Speech normal.        Behavior: Behavior normal. Behavior is cooperative.        Thought Content: Thought content normal.        Cognition and Memory: Cognition and memory normal.        Judgment: Judgment normal.   Review of Systems  Constitutional: Negative.   HENT: Negative.    Eyes: Negative.   Respiratory: Negative.    Cardiovascular: Negative.   Gastrointestinal: Negative.   Genitourinary: Negative.   Musculoskeletal: Negative.   Skin: Negative.   Neurological: Negative.    Endo/Heme/Allergies: Negative.   Psychiatric/Behavioral: Negative.    Blood pressure (!) 129/91, pulse 88, temperature 98.1 F (36.7 C), temperature source Oral, resp. rate 18, height 5' (1.524 m), weight 55.8 kg, SpO2 95 %. Body mass index is 24.02 kg/m.   Social History   Tobacco Use  Smoking Status Every Day   Packs/day: 0.50   Types: Cigarettes   Passive exposure: Never  Smokeless Tobacco Never   Tobacco Cessation:  N/A, patient does not currently use tobacco products   Blood Alcohol level:  Lab Results  Component Value Date   ETH <10 12/13/2021   ETH <10 02/63/7858    Metabolic Disorder Labs:  Lab Results  Component Value Date   HGBA1C 5.4 10/12/2018   MPG 108.28 10/12/2018   No results found for: PROLACTIN Lab Results  Component Value Date   CHOL 180 11/05/2021   TRIG 49 11/05/2021   HDL 67 11/05/2021  CHOLHDL 2.7 11/05/2021   VLDL 10 11/05/2021   LDLCALC 103 (H) 11/05/2021    See Psychiatric Specialty Exam and Suicide Risk Assessment completed by Attending Physician prior to discharge.  Discharge destination:  Home  Is patient on multiple antipsychotic therapies at discharge:  No   Has Patient had three or more failed trials of antipsychotic monotherapy by history:  No  Recommended Plan for Multiple Antipsychotic Therapies: NA   Allergies as of 12/18/2021       Reactions   Desvenlafaxine Anaphylaxis   Morphine And Related Anaphylaxis   Penicillins Anaphylaxis   Has patient had a PCN reaction causing immediate rash, facial/tongue/throat swelling, SOB or lightheadedness with hypotension: Yes Has patient had a PCN reaction causing severe rash involving mucus membranes or skin necrosis: No Has patient had a PCN reaction that required hospitalization: Unknown Has patient had a PCN reaction occurring within the last 10 years: No If all of the above answers are "NO", then may proceed with Cephalosporin use.   Prazosin Other (See Comments)   Tamoxifen  Anaphylaxis   Trazodone Anaphylaxis   Clonazepam    Codeine Swelling   Duloxetine Itching   Gabapentin Hives   Rash and swelling.    Hydroxyzine Itching   Lorazepam    Patient feels this medications makes her to sedated and wants to avoid   Paroxetine Hcl Hives   Sulfa Antibiotics Itching   Cephalexin Rash        Medication List     STOP taking these medications    acetaminophen 500 MG tablet Commonly known as: TYLENOL   doxycycline 100 MG tablet Commonly known as: VIBRA-TABS   predniSONE 20 MG tablet Commonly known as: DELTASONE   sodium chloride 1 g tablet       TAKE these medications      Indication  albuterol 108 (90 Base) MCG/ACT inhaler Commonly known as: VENTOLIN HFA Inhale 1-2 puffs into the lungs every 4 (four) hours as needed for cough or wheezing.    citalopram 10 MG tablet Commonly known as: CELEXA Take 1 tablet (10 mg total) by mouth daily. Start taking on: December 19, 2021    cyclobenzaprine 10 MG tablet Commonly known as: FLEXERIL Take 1 tablet (10 mg total) by mouth 3 (three) times daily as needed for muscle spasms.    denosumab 60 MG/ML Sosy injection Commonly known as: PROLIA Inject 60 mg into the skin every 6 (six) months.    diazepam 2 MG tablet Commonly known as: VALIUM TAKE 1 TABLET (2 MG TOTAL) BY MOUTH DAILY AS NEEDED FOR ANXIETY. FOR SEVERE PANIC SYMPTOMS What changed: Another medication with the same name was added. Make sure you understand how and when to take each.    diazepam 2 MG tablet Commonly known as: VALIUM Take 1 tablet (2 mg total) by mouth daily as needed for anxiety. What changed: You were already taking a medication with the same name, and this prescription was added. Make sure you understand how and when to take each.    EPINEPHrine 0.3 mg/0.3 mL Soaj injection Commonly known as: EPI-PEN Inject 0.3 mg into the muscle as needed for anaphylaxis.    fluticasone 50 MCG/ACT nasal spray Commonly known as:  FLONASE Place 2 sprays into both nostrils daily.    hydrocortisone 1 % lotion Apply topically 3 (three) times daily as needed for itching.    ipratropium 0.02 % nebulizer solution Commonly known as: ATROVENT Take 0.25 mg by nebulization 4 (four) times daily.  Indication: Chronic Obstructive Lung Disease   lidocaine 5 % Commonly known as: LIDODERM Place 1 patch onto the skin daily for 14 days. Remove & Discard patch within 12 hours or as directed by MD    mirtazapine 15 MG tablet Commonly known as: REMERON Take 1 tablet (15 mg total) by mouth at bedtime.    QUEtiapine 200 MG tablet Commonly known as: SEROQUEL Take 1 tablet (200 mg total) by mouth at bedtime.    Trelegy Ellipta 100-62.5-25 MCG/ACT Aepb Generic drug: Fluticasone-Umeclidin-Vilant Inhale 1 puff into the lungs daily.         Follow-up Information     [facility] Follow up.   Why: Please contact [facility] to set up your appointment with Dr. Neysa Bonito.  You have an appointment scheduled with Shary Decamp on Tuesday at [place]. Thanks! Contact information: Main Office [address] [place] [address] Phone: [phone]                Follow-up recommendations:  As above    Signed: [name], DO 12/18/2021, 9:49 AM

## 2021-12-18 NOTE — Progress Notes (Signed)
°  [facility] Adult Case Management Discharge Plan :  Will you be returning to the same living situation after discharge:  Yes,  pt's home At discharge, do you have transportation home?: Yes,  pt's husband is providing transportation Do you have the ability to pay for your medications: Yes,  pt has [employer] of information consent forms completed and in the chart;  Patient's signature needed at discharge.  Patient to Follow up at:  Follow-up Information     [facility] Follow up.   Why: Please contact [facility] to set up your appointment with Dr. Neysa Bonito.  You have an appointment scheduled with Shary Decamp on Tuesday at [place]. Thanks! Contact information: Main Office [address] [place] [address] Phone: [phone]                Next level of care provider has access to [place] and Suicide Prevention discussed: Yes,  completed with pt's husband     Has patient been referred to the Quitline?: Patient refused referral  Patient has been referred for addiction treatment: N/A  Aaryanna Hyden A [place], [place] 12/18/2021, 9:35 AM

## 2021-12-18 NOTE — [facility] Counselor (Signed)
CSW spoke with pt regarding discharge planning.  She stated that she had a follow up appointment with her therapist already set up and would facilitate making an appointment with [facility] psychiatry due to her working with a Chief Executive Officer via her insurance. CSW acknowledged receipt of information and made note of appointment in pt's discharge summary.   Jesenya Bowditch [place], MSW, LCSW-A 2/8/20233:08 PM

## 2021-12-20 ENCOUNTER — Telehealth: Payer: Self-pay | Admitting: Psychiatry

## 2021-12-20 ENCOUNTER — Encounter: Payer: Self-pay | Admitting: Psychiatry

## 2021-12-20 NOTE — Telephone Encounter (Signed)
Attempted to contact patient to discuss letter that she requested.  I have printed out a letter excusing her from jury duty.  I will have Janett Billow CMA sent this to patient after contacting her.

## 2021-12-25 ENCOUNTER — Other Ambulatory Visit: Payer: Self-pay | Admitting: Neurology

## 2021-12-25 ENCOUNTER — Other Ambulatory Visit (HOSPITAL_COMMUNITY): Payer: Self-pay | Admitting: Neurology

## 2021-12-25 DIAGNOSIS — R404 Transient alteration of awareness: Secondary | ICD-10-CM

## 2022-01-03 ENCOUNTER — Ambulatory Visit
Admission: RE | Admit: 2022-01-03 | Discharge: 2022-01-03 | Disposition: A | Discharge status: Home / Self Care | Source: Ambulatory Visit | Attending: Neurology | Admitting: Neurology

## 2022-01-03 DIAGNOSIS — R404 Transient alteration of awareness: Secondary | ICD-10-CM | POA: Insufficient documentation

## 2022-01-03 IMAGING — MR MR HEAD W/O CM
13 series · 48 of 48 positions shown · non-contrast
Comparison: Cervical spine MRI [DATE].

CLINICAL DATA: Provided history: Transient alteration of awareness.
Additional history provided by scanning technologist: Patient
reports chronic pain and paresthesias, fibromyalgia diagnosed in
[PW].

EXAM:
MRI HEAD WITHOUT CONTRAST
TECHNIQUE: Multiplanar, multiecho pulse sequences of the brain and surrounding
structures were obtained without intravenous contrast.

[Series 5: ax dwi_tracew · axial · 3.0mm · 0.71mm/px · z∈[-130,+34]mm · 4 of 56 slices shown]
[im 1/56]
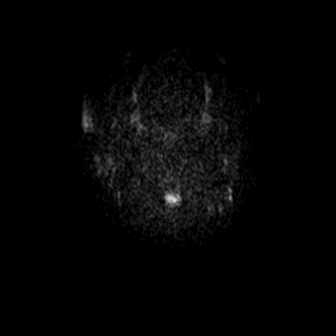
[im 19/56]
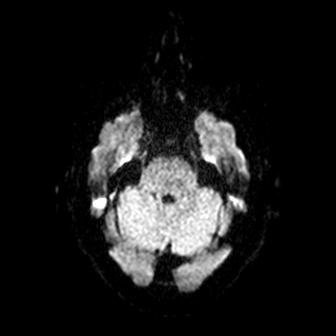
[im 37/56]
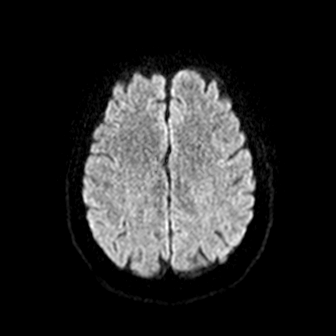
[im 56/56]
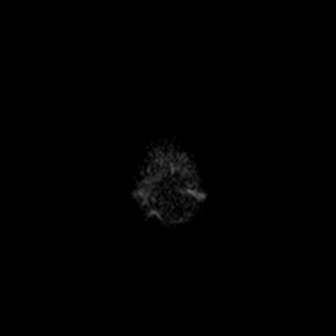

[Series 6: ax dwi_adc · axial · 3.0mm · 0.71mm/px · z∈[-130,+34]mm · 4 of 56 slices shown]
[im 1/56]
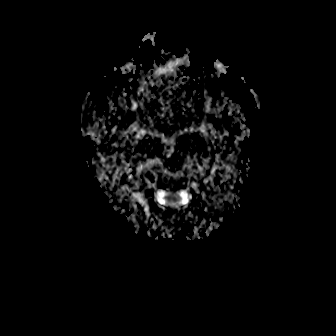
[im 19/56]
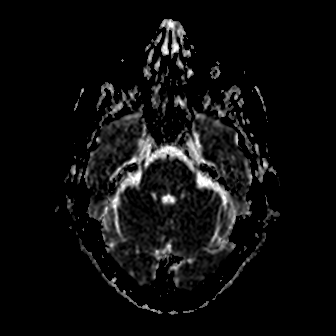
[im 37/56]
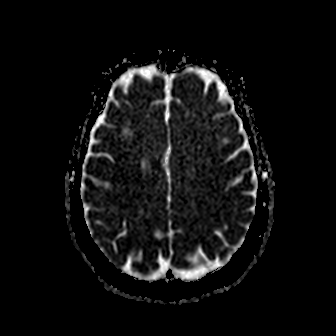
[im 56/56]
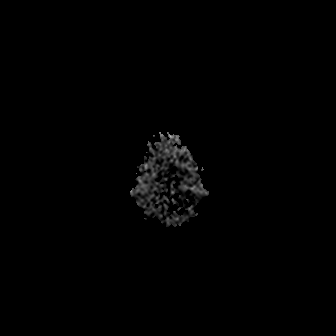

[Series 7: cor dwi_tracew · coronal · 5.0mm · 0.68mm/px · 2 of 36 slices shown]
[im 1/36]
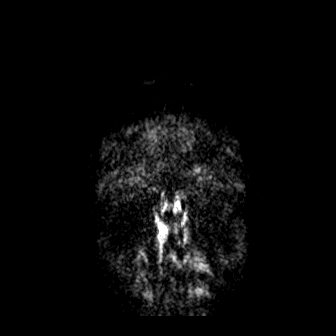
[im 36/36]
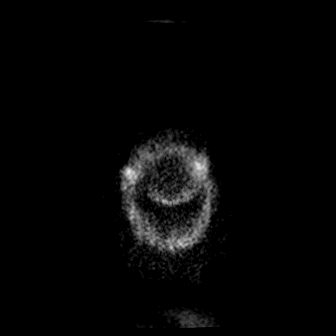

[Series 8: cor dwi_adc · coronal · 5.0mm · 0.68mm/px · 2 of 36 slices shown]
[im 1/36]
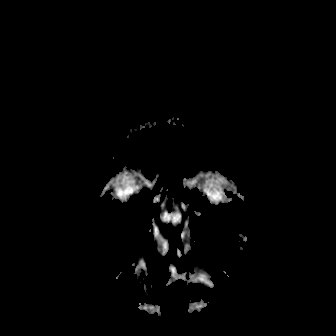
[im 36/36]
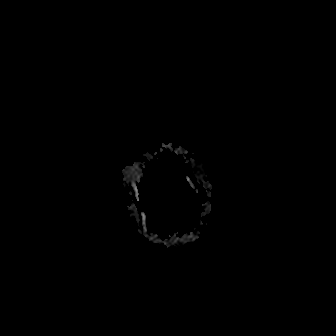

[Series 9: T1 · sagittal · 5.0mm · 0.62mm/px · 1 of 22 slices shown (1 of 2)]
[im 1/22]
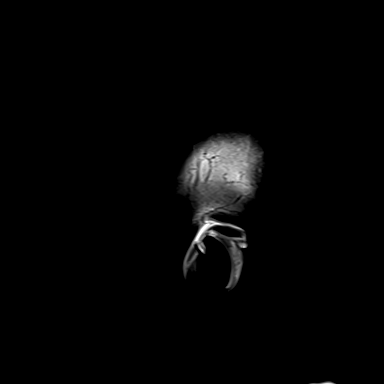

[Series 10: T2 · axial · 5.0mm · 0.53mm/px · z∈[-118,+25]mm · 2 of 25 slices shown (1 of 2)]
[im 1/25]
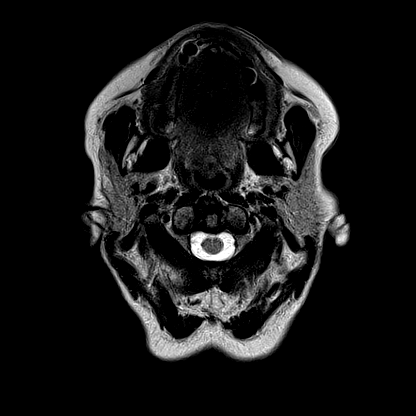
[im 25/25]
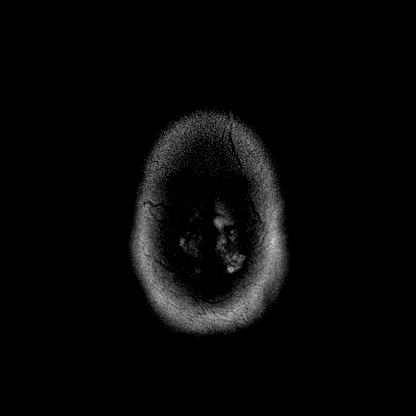

[Series 11: mag_images · axial · 3.0mm · 0.90mm/px · z∈[-134,+41]mm · 4 of 60 slices shown]
[im 1/60]
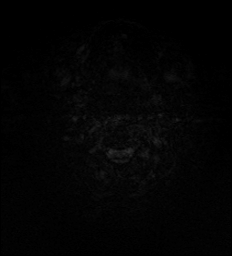
[im 20/60]
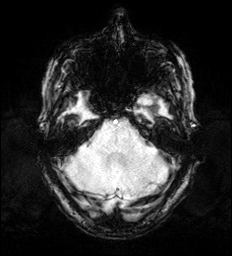
[im 40/60]
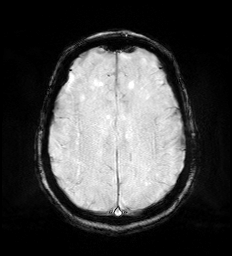
[im 60/60]
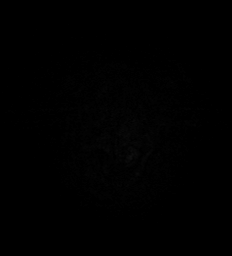

[Series 12: pha_images · axial · 3.0mm · 0.90mm/px · z∈[-134,+41]mm · 4 of 60 slices shown]
[im 1/60]
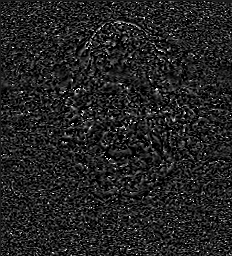
[im 20/60]
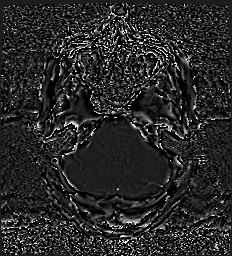
[im 40/60]
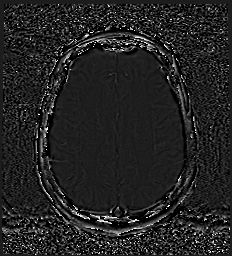
[im 60/60]
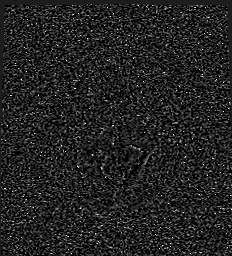

[Series 13: swi_images · axial · 3.0mm · 0.90mm/px · z∈[-134,+41]mm · 4 of 60 slices shown]
[im 1/60]
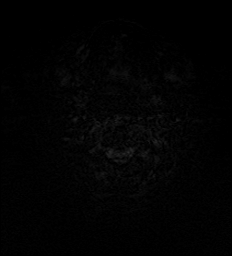
[im 20/60]
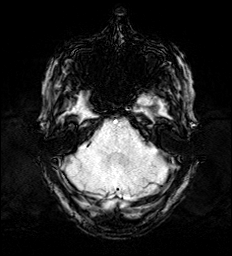
[im 40/60]
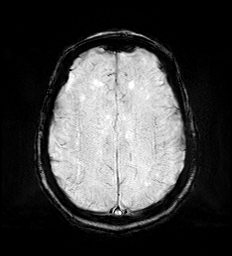
[im 60/60]
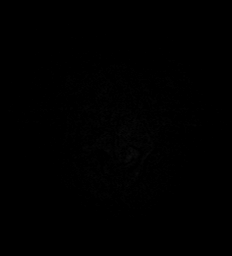

[Series 14: mip_images(sw) · axial · 24.0mm · 0.90mm/px · z∈[-124,+30]mm · 4 of 53 slices shown]
[im 1/53]
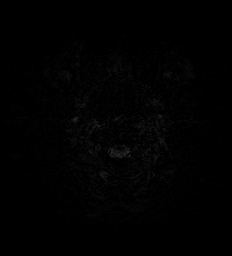
[im 18/53]
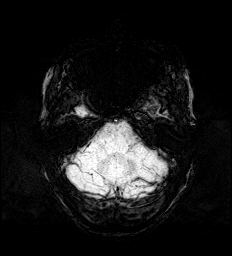
[im 35/53]
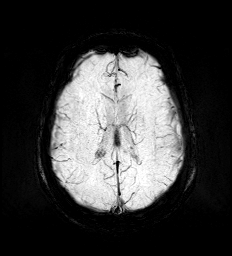
[im 53/53]
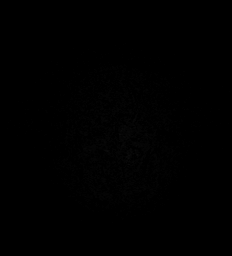

[Series 15: FLAIR · axial · 3.0mm · 0.53mm/px · z∈[-126,+34]mm · 4 of 55 slices shown]
[im 1/55]
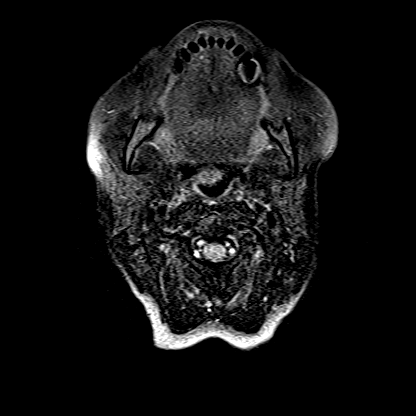
[im 19/55]
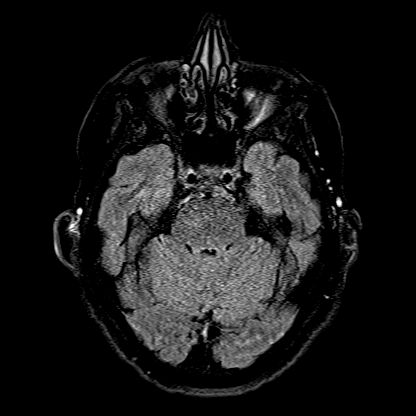
[im 37/55]
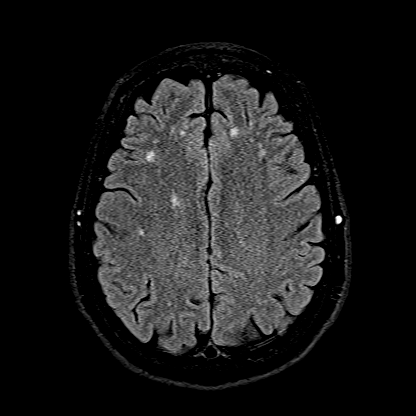
[im 55/55]
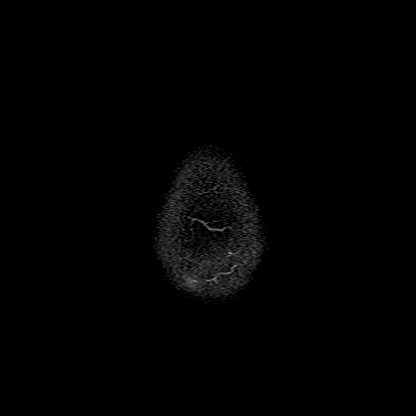

[Series 16: T1 · axial · 1.0mm · 0.98mm/px · z∈[-127,+30]mm · 11 of 160 slices shown (2 of 2)]
[im 1/160]
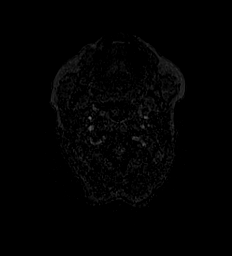
[im 16/160]
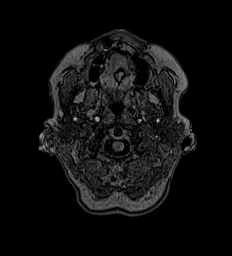
[im 32/160]
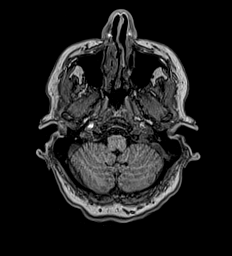
[im 48/160]
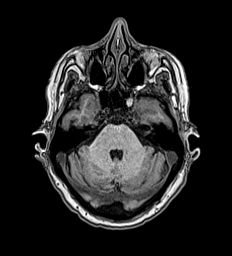
[im 64/160]
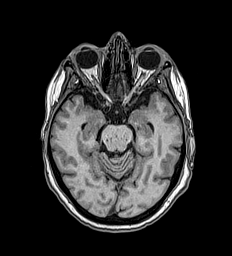
[im 80/160]
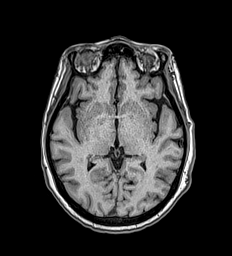
[im 96/160]
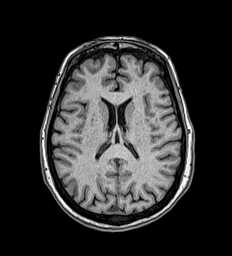
[im 112/160]
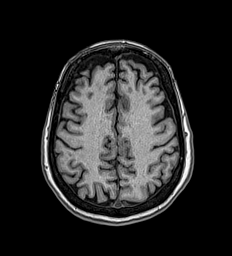
[im 128/160]
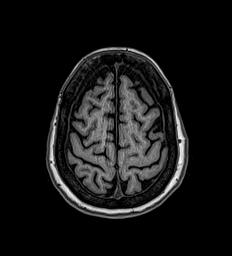
[im 144/160]
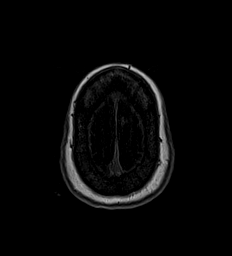
[im 160/160]
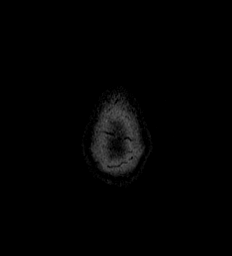

[Series 17: T2 · coronal · 5.0mm · 0.57mm/px · 2 of 29 slices shown (2 of 2)]
[im 1/29]
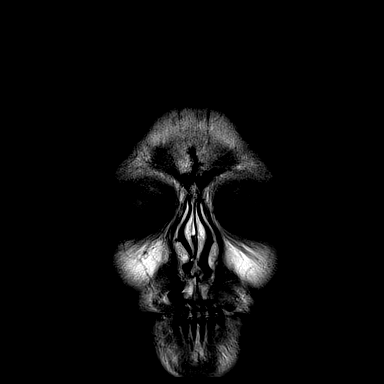
[im 29/29]
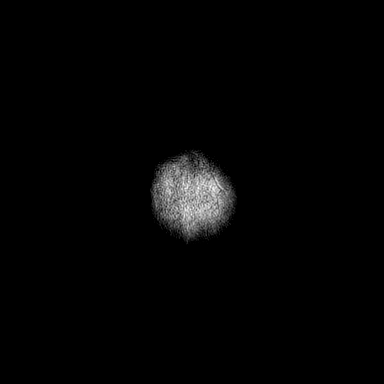

[48 of 48 positions shown; findings below may reference images not displayed]

FINDINGS: Brain:

Cerebral volume appears normal for age.

Mild-to-moderate multifocal T2 FLAIR hyperintense signal abnormality
within the cerebral white matter, nonspecific and advanced for age.

No cortical encephalomalacia is identified.

There is no acute infarct.

No evidence of an intracranial mass.

No chronic intracranial blood products.

No extra-axial fluid collection.

No midline shift.

Vascular: Maintained flow voids within the proximal large arterial
vessels.

Skull and upper cervical spine: No focal suspicious marrow lesion.
Incompletely assessed cervical spondylosis.

Sinuses/Orbits: Visualized orbits show no acute finding. Small
mucous retention cyst within the left sphenoid sinus.

Other: Small-volume fluid within the bilateral mastoid air cells.
IMPRESSION: No evidence of acute intracranial abnormality.

Mild-to-moderate multifocal T2 FLAIR hyperintense signal abnormality
within the cerebral white matter, advanced for age. These signal
changes are nonspecific and differential considerations include
chronic small vessel ischemic disease, sequela of chronic migraine
headaches, sequela of a prior infectious/inflammatory process or
sequela of a demyelinating process (such as multiple sclerosis),
among others.

Small mucous retention cyst within the left sphenoid sinus.

Small-volume fluid within the bilateral mastoid air cells.

## 2022-01-07 ENCOUNTER — Emergency Department

## 2022-01-07 ENCOUNTER — Other Ambulatory Visit: Payer: Self-pay

## 2022-01-07 ENCOUNTER — Telehealth: Payer: Self-pay | Admitting: Psychiatry

## 2022-01-07 ENCOUNTER — Emergency Department
Admission: EM | Admit: 2022-01-07 | Discharge: 2022-01-07 | Disposition: A | Discharge status: Home / Self Care | Attending: Emergency Medicine | Admitting: Emergency Medicine

## 2022-01-07 DIAGNOSIS — J441 Chronic obstructive pulmonary disease with (acute) exacerbation: Secondary | ICD-10-CM

## 2022-01-07 DIAGNOSIS — F410 Panic disorder [episodic paroxysmal anxiety] without agoraphobia: Secondary | ICD-10-CM | POA: Insufficient documentation

## 2022-01-07 DIAGNOSIS — F419 Anxiety disorder, unspecified: Secondary | ICD-10-CM

## 2022-01-07 DIAGNOSIS — M542 Cervicalgia: Secondary | ICD-10-CM | POA: Diagnosis not present

## 2022-01-07 DIAGNOSIS — R0602 Shortness of breath: Secondary | ICD-10-CM | POA: Diagnosis present

## 2022-01-07 LAB — CBC
HCT: 43 % (ref 36.0–46.0)
Hemoglobin: 13.9 g/dL (ref 12.0–15.0)
MCH: 28.9 pg (ref 26.0–34.0)
MCHC: 32.3 g/dL (ref 30.0–36.0)
MCV: 89.4 fL (ref 80.0–100.0)
Platelets: 290 10*3/uL (ref 150–400)
RBC: 4.81 MIL/uL (ref 3.87–5.11)
RDW: 14.5 % (ref 11.5–15.5)
WBC: 7.5 10*3/uL (ref 4.0–10.5)
nRBC: 0 % (ref 0.0–0.2)

## 2022-01-07 LAB — BASIC METABOLIC PANEL
Anion gap: 10 (ref 5–15)
BUN: 5 mg/dL — ABNORMAL LOW (ref 8–23)
CO2: 26 mmol/L (ref 22–32)
Calcium: 8.8 mg/dL — ABNORMAL LOW (ref 8.9–10.3)
Chloride: 94 mmol/L — ABNORMAL LOW (ref 98–111)
Creatinine, Ser: 0.4 mg/dL — ABNORMAL LOW (ref 0.44–1.00)
GFR, Estimated: >60 mL/min (ref >60)
Glucose, Bld: 152 mg/dL — ABNORMAL HIGH (ref 70–99)
Potassium: 3.8 mmol/L (ref 3.5–5.1)
Sodium: 130 mmol/L — ABNORMAL LOW (ref 135–145)

## 2022-01-07 IMAGING — CR DG CHEST 2V
2 series · 2 of 2 positions shown · non-contrast
Comparison: One-view chest x-ray [DATE] and [DATE].

CLINICAL DATA: Right-sided chest pain and tightness. Shortness of
breath for 5 days.

EXAM:
CHEST - 2 VIEW

[chest lat]
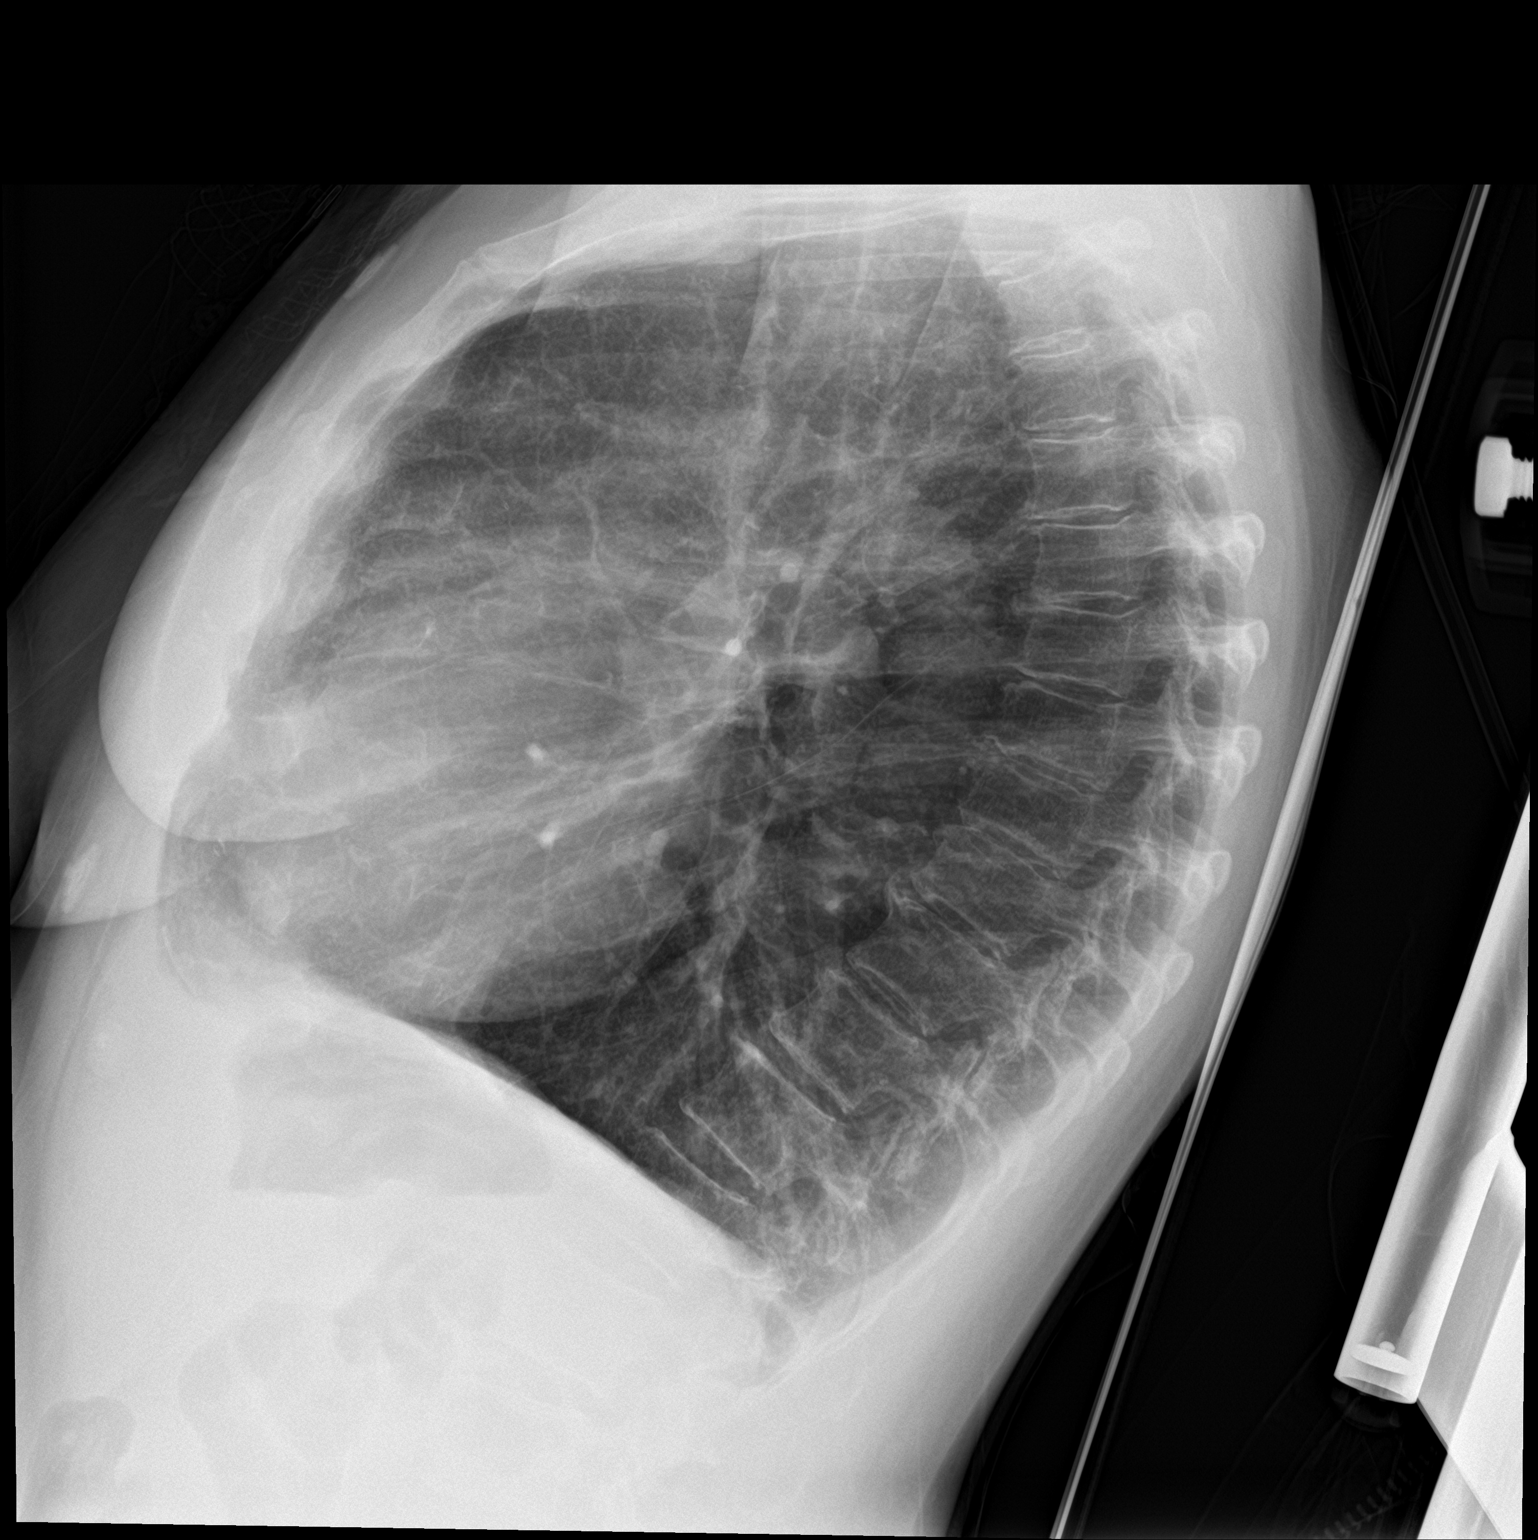

[chest ap]
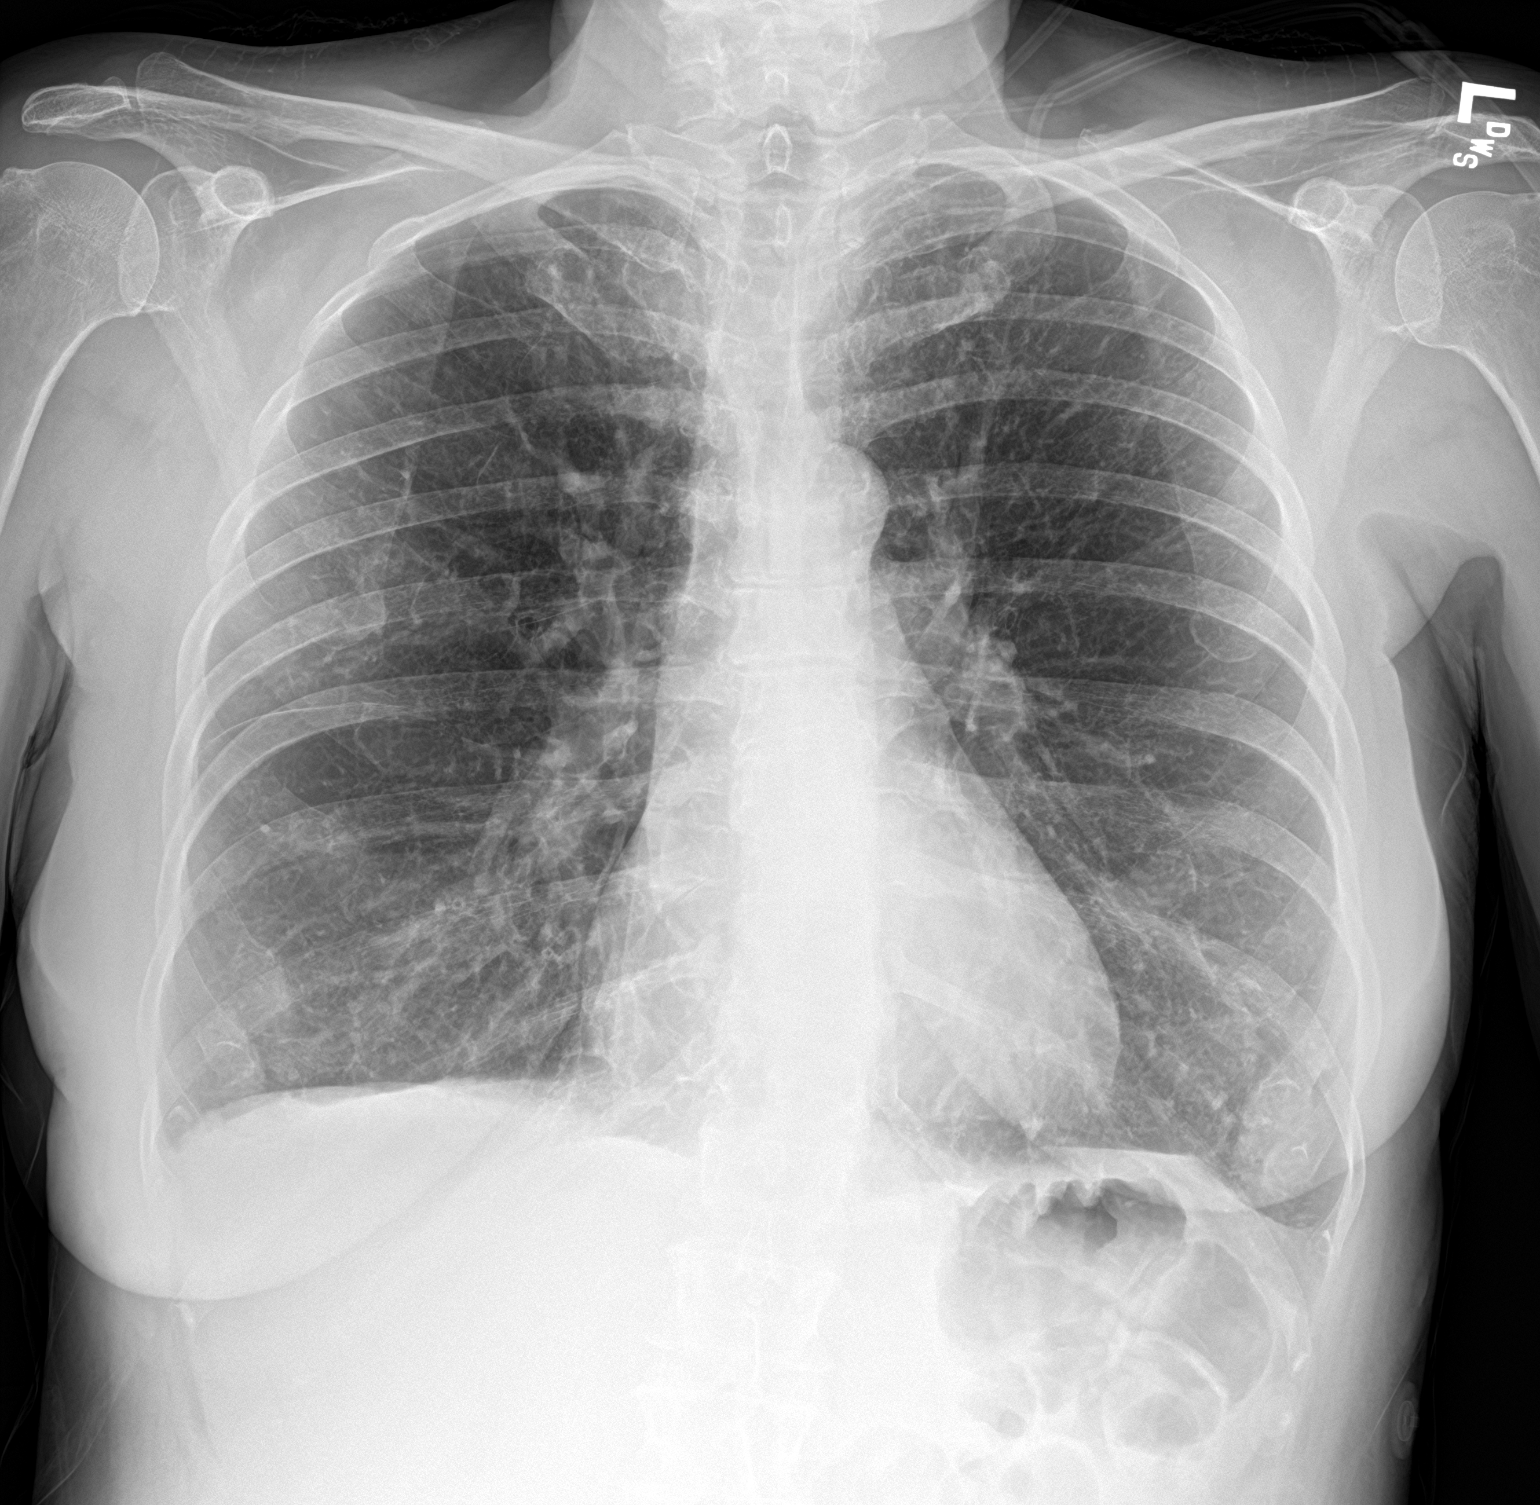

[2 of 2 positions shown; findings below may reference images not displayed]

FINDINGS: Heart size is normal. Chronic interstitial coarsening is similar the
prior studies. No superimposed airspace disease is present. Changes
of COPD are present. Degenerative changes are present in the
thoracic spine. No focal osseous lesions are present.
IMPRESSION: 1. Stable chronic interstitial coarsening.
2. No acute cardiopulmonary disease.

## 2022-01-07 MED ORDER — CYCLOBENZAPRINE HCL 10 MG PO TABS
10.0000 mg | ORAL_TABLET | Freq: Once | ORAL | Status: AC
  Administered 2022-01-07: 10 mg via ORAL
  Filled 2022-01-07: qty 1

## 2022-01-07 MED ORDER — IPRATROPIUM-ALBUTEROL 0.5-2.5 (3) MG/3ML IN SOLN
3.0000 mL | Freq: Once | RESPIRATORY_TRACT | Status: AC
  Administered 2022-01-07: 3 mL via RESPIRATORY_TRACT
  Filled 2022-01-07: qty 3

## 2022-01-07 MED ORDER — IPRATROPIUM-ALBUTEROL 0.5-2.5 (3) MG/3ML IN SOLN
3.0000 mL | Freq: Once | RESPIRATORY_TRACT | Status: AC
Start: 2022-01-07 — End: 2022-01-07
  Administered 2022-01-07: 3 mL via RESPIRATORY_TRACT
  Filled 2022-01-07: qty 3

## 2022-01-07 MED ORDER — METHYLPREDNISOLONE SODIUM SUCC 125 MG IJ SOLR
125.0000 mg | Freq: Once | INTRAMUSCULAR | Status: AC
  Administered 2022-01-07: 125 mg via INTRAVENOUS
  Filled 2022-01-07: qty 2

## 2022-01-07 NOTE — ED Triage Notes (Signed)
Pt here with SOB since Fri. Pt has a hx of panic attacks. Pt also c/o back pain. Pt stable in triage.

## 2022-01-07 NOTE — ED Provider Notes (Signed)
[facility] Provider Note    Event Date/Time   First MD Initiated Contact with Patient 01/07/22 1610     (approximate)   History   Shortness of Breath   HPI  Colleen [name] is a 61 y.o. female  who, per psychiatry note dated today had apparently mentioned to them that she would like to be admitted to an inpatient unit, who presents to the emergency department today with primary concern for shortness of breath.  Patient states she has a history of COPD.  Her shortness of breath is gotten worse over the past couple of days.  She has tried her home treatments without any significant relief.  In addition the patient states that she has been dealing with neck pain.  She states she is seeing outside doctors for this.  Lastly the patient feels like she is quite anxious.  She was worried she was having a panic attack.  She would like to speak to a psychiatrist.  Physical Exam   Triage Vital Signs: ED Triage Vitals  Enc Vitals Group     BP 01/07/22 1447 (!) 138/100     Pulse Rate 01/07/22 1447 (!) 108     Resp 01/07/22 1447 (!) 26     Temp 01/07/22 1447 98.9 F (37.2 C)     Temp Source 01/07/22 1447 Oral     SpO2 01/07/22 1447 98 %     Weight 01/07/22 1447 125 lb (56.7 kg)     Height 01/07/22 1447 5' (1.524 m)     Head Circumference --      Peak Flow --      Pain Score 01/07/22 1501 10   Most recent vital signs: Vitals:   01/07/22 1545 01/07/22 1600  BP: (!) 134/93 130/88  Pulse: 97 99  Resp: (!) 23 (!) 30  Temp:    SpO2: 99% 97%    General: Awake, no distress.  CV:  Good peripheral perfusion.  Resp:  Normal effort. Some expiratory wheezing. Abd:  No distention.  MSK:  No lower extremity edema.    ED Results / Procedures / Treatments   Labs (all labs ordered are listed, but only abnormal results are displayed) Labs Reviewed  BASIC METABOLIC PANEL - Abnormal; Notable for the following components:      Result Value   Sodium 130 (*)     Chloride 94 (*)    Glucose, Bld 152 (*)    BUN 5 (*)    Creatinine, Ser 0.40 (*)    Calcium 8.8 (*)    All other components within normal limits  CBC     EKG  I, Nance Pear, attending physician, personally viewed and interpreted this EKG  EKG Time: 1444 Rate: 110 Rhythm: sinus tachycardia Axis: normal Intervals: qtc 484 QRS: narrow ST changes: no st elevation Impression: abnormal ekg  RADIOLOGY CXR I independently interpreted and visualized the CXR. My interpretation: No pneumonia, no pneumothorax Radiology interpretation:  IMPRESSION:  1. Stable chronic interstitial coarsening.  2. No acute cardiopulmonary disease.      PROCEDURES:  Critical Care performed: No  Procedures   MEDICATIONS ORDERED IN ED: Medications - No data to display   IMPRESSION / MDM / [place] / ED COURSE  I reviewed the triage vital signs and the nursing notes.                              Differential diagnosis  includes, but is not limited to, pneumonia, pneumothorax, CHF, COPD exacerbation.  Patient presented to the emergency department today because of concerns for shortness of breath.  On exam she did have some expiratory wheezing.  Chest x-ray did not show any pneumonia or pneumothorax.  This time I have lower suspicion for PE given lack of any chest pain.  She does have some discomfort in her neck although this is a somewhat chronic issue for the patient.  No lower extremity edema.  Patient did feel better after steroids and DuoNeb treatments.  She states she already has a steroid taper at home.  In addition patient was concerned for some anxiety.  I did offer to have patient be seen by psychiatry and that order was placed however patient chose to go home prior to psychiatry being able to evaluate the patient.  Did encourage patient to return for any worsening symptoms.   FINAL CLINICAL IMPRESSION(S) / ED DIAGNOSES   Final diagnoses:  COPD exacerbation ([place])   Anxiety     Note:  This document was prepared using Dragon voice recognition software and may include unintentional dictation errors.    Nance Pear, MD 01/07/22 2004

## 2022-01-07 NOTE — ED Notes (Signed)
Pt complaining of chronic tailbone pain and takes flexeril 10 mg tid - last dose this am. Edp notified for possible order

## 2022-01-07 NOTE — ED Notes (Signed)
Pt with upper airway wheezing. On 2LNC with sats 98%. Pt with dry cough.

## 2022-01-07 NOTE — Telephone Encounter (Signed)
left message for patient to call office back to find out more details or what she needs.

## 2022-01-07 NOTE — Telephone Encounter (Signed)
From discharge summary from her most recent inpatient admission patient was referred to another provider for management of her psychiatric medications. Please call this patient and advise her to go to the nearest urgent care or emergency department if in a crisis.

## 2022-01-07 NOTE — Discharge Instructions (Addendum)
Please seek medical attention for any high fevers, chest pain, shortness of breath, change in behavior, persistent vomiting, bloody stool or any other new or concerning symptoms.  

## 2022-01-07 NOTE — ED Notes (Signed)
Pt disconnected from monitor so she can walk around room. Pt has back pain and needs to move around.

## 2022-01-07 NOTE — Telephone Encounter (Signed)
Please give patient a calls. Wants to get admitted to inpatient unit at [facility] and needs to speak with you before hand

## 2022-01-15 ENCOUNTER — Ambulatory Visit (HOSPITAL_BASED_OUTPATIENT_CLINIC_OR_DEPARTMENT_OTHER): Admitting: Student in an Organized Health Care Education/Training Program

## 2022-01-15 ENCOUNTER — Encounter: Payer: Self-pay | Admitting: Student in an Organized Health Care Education/Training Program

## 2022-01-15 ENCOUNTER — Ambulatory Visit
Admission: RE | Admit: 2022-01-15 | Discharge: 2022-01-15 | Disposition: A | Discharge status: Home / Self Care | Source: Ambulatory Visit | Attending: Student in an Organized Health Care Education/Training Program | Admitting: Student in an Organized Health Care Education/Training Program

## 2022-01-15 ENCOUNTER — Other Ambulatory Visit: Payer: Self-pay

## 2022-01-15 VITALS — BP 135/95 | HR 115 | Temp 98.1°F | Resp 16 | Ht 60.0 in | Wt 121.0 lb

## 2022-01-15 DIAGNOSIS — M47812 Spondylosis without myelopathy or radiculopathy, cervical region: Secondary | ICD-10-CM

## 2022-01-15 DIAGNOSIS — G894 Chronic pain syndrome: Secondary | ICD-10-CM | POA: Diagnosis not present

## 2022-01-15 MED ORDER — LIDOCAINE HCL 2 % IJ SOLN
INTRAMUSCULAR | Status: AC
  Filled 2022-01-15: qty 20

## 2022-01-15 MED ORDER — LIDOCAINE HCL 2 % IJ SOLN
20.0000 mL | Freq: Once | INTRAMUSCULAR | Status: AC
  Administered 2022-01-15: 400 mg

## 2022-01-15 MED ORDER — ROPIVACAINE HCL 2 MG/ML IJ SOLN
INTRAMUSCULAR | Status: AC
  Filled 2022-01-15: qty 20

## 2022-01-15 MED ORDER — DEXAMETHASONE SODIUM PHOSPHATE 10 MG/ML IJ SOLN
10.0000 mg | Freq: Once | INTRAMUSCULAR | Status: AC
Start: 2022-01-15 — End: 2022-01-15
  Administered 2022-01-15: 10 mg

## 2022-01-15 MED ORDER — CYCLOBENZAPRINE HCL 10 MG PO TABS: 10.0000 mg | ORAL_TABLET | Freq: Three times a day (TID) | ORAL | 0 refills | Status: DC | PRN

## 2022-01-15 MED ORDER — DEXAMETHASONE SODIUM PHOSPHATE 10 MG/ML IJ SOLN
INTRAMUSCULAR | Status: AC
  Filled 2022-01-15: qty 2

## 2022-01-15 MED ORDER — ROPIVACAINE HCL 2 MG/ML IJ SOLN
9.0000 mL | Freq: Once | INTRAMUSCULAR | Status: AC
Start: 2022-01-15 — End: 2022-01-15
  Administered 2022-01-15: 9 mL via PERINEURAL

## 2022-01-15 MED ORDER — ORPHENADRINE CITRATE 30 MG/ML IJ SOLN
30.0000 mg | Freq: Once | INTRAMUSCULAR | Status: AC
Start: 2022-01-15 — End: 2022-01-15
  Administered 2022-01-15: 30 mg via INTRAMUSCULAR

## 2022-01-15 MED ORDER — ORPHENADRINE CITRATE 30 MG/ML IJ SOLN
INTRAMUSCULAR | Status: AC
  Filled 2022-01-15: qty 2

## 2022-01-15 NOTE — Patient Instructions (Signed)

## 2022-01-15 NOTE — Progress Notes (Signed)
noPROVIDER NOTE: Interpretation of information contained herein should be left to medically-trained personnel. Specific patient instructions are provided elsewhere under "Patient Instructions" section of medical record. This document was created in part using STT-dictation technology, any transcriptional errors that may result from this process are unintentional.  Patient: Colleen [name] Type: Established DOB: May 15, 1961 MRN: [id number] PCP: Remi Haggard, FNP  Service: Procedure DOS: 01/15/2022 Setting: Ambulatory Location: Ambulatory outpatient facility Delivery: Face-to-face Provider: Gillis Santa, MD Specialty: Interventional Pain Management Specialty designation: 09 Location: Outpatient facility Ref. Prov.: Remi Haggard, FNP    Primary Reason for Visit: Interventional Pain Management Treatment. CC: Neck Pain (Bilateral ) and Hip Pain (Bilateral )   Procedure:           Type: Cervical Facet Medial Branch Block(s) #1  Laterality: Bilateral  Level: C3, C4, C5, C6, Medial Branch Level(s). Injecting these levels blocks the C3-4, C4-5, C5-6, cervical facet joints.  Imaging: Fluoroscopic guidance Anesthesia: Local anesthesia (1-2% Lidocaine) Anxiolysis: None                 Sedation: None. DOS: 01/15/2022  Performed by: Gillis Santa, MD  Purpose: Diagnostic/Therapeutic Indications: Cervicalgia (cervical spine axial pain) severe enough to impact quality of life or function. 1. Cervical facet joint syndrome (C3/4, C4/5)   2. Chronic pain syndrome    NAS-11 Pain score:   Pre-procedure: 10-Worst pain ever/10   Post-procedure: 4  (right side numb and left side @ 4)/10     Position / Prep / Materials:  Position: Prone. Head in cradle. C-spine slightly flexed. Prep solution: DuraPrep (Iodine Povacrylex [0.7% available iodine] and Isopropyl Alcohol, 74% w/w) Prep Area: Posterior Cervico-thoracic Region. From occipital ridge to tip of scapula, and from shoulder to shoulder. Entire  posterior and lateral neck surface. Materials:  Tray: Block Needle(s):  Type: Spinal  Gauge (G): 22"  Length: 3.5-in  Qty: 4  Pre-op H&P Assessment:  [name] is a 61 y.o. (year old), female patient, seen today for interventional treatment. She  has a past surgical history that includes Nephrectomy (Left); Mastectomy (Left); Tubal ligation; Mastectomy; Breast biopsy (Left, 2004); Breast biopsy (Right); Breast lumpectomy (Left, 2004); and LEFT HEART CATH AND CORONARY ANGIOGRAPHY (N/A, 11/06/2021). [name] has a current medication list which includes the following prescription(s): albuterol, citalopram, denosumab, diazepam, epinephrine, fluticasone, trelegy ellipta, hydrocortisone, ipratropium, mirtazapine, quetiapine, cyclobenzaprine, and diazepam, and the following Facility-Administered Medications: orphenadrine. Her primarily concern today is the Neck Pain (Bilateral ) and Hip Pain (Bilateral )  Initial Vital Signs:  Pulse/HCG Rate: (!) 115ECG Heart Rate: (!) 115 Temp: 98.1 F (36.7 C) Resp: 16 BP: 112/84 SpO2: 92 %  BMI: Estimated body mass index is 23.63 kg/m as calculated from the following:   Height as of this encounter: 5' (1.524 m).   Weight as of this encounter: 121 lb (54.9 kg).  Risk Assessment: Allergies: Reviewed. She is allergic to desvenlafaxine, morphine and related, penicillins, prazosin, tamoxifen, trazodone, clonazepam, codeine, duloxetine, gabapentin, hydroxyzine, lorazepam, paroxetine hcl, sulfa antibiotics, and cephalexin.  Allergy Precautions: None required Coagulopathies: Reviewed. None identified.  Blood-thinner therapy: None at this time Active Infection(s): Reviewed. None identified. [name] is afebrile  Site Confirmation: [name] was asked to confirm the procedure and laterality before marking the site Procedure checklist: Completed Consent: Before the procedure and under the influence of no sedative(s), amnesic(s), or anxiolytics, the patient  was informed of the treatment options, risks and possible complications. To fulfill our ethical and legal obligations, as recommended by the  American Medical Association's Code of Ethics, I have informed the patient of my clinical impression; the nature and purpose of the treatment or procedure; the risks, benefits, and possible complications of the intervention; the alternatives, including doing nothing; the risk(s) and benefit(s) of the alternative treatment(s) or procedure(s); and the risk(s) and benefit(s) of doing nothing. The patient was provided information about the general risks and possible complications associated with the procedure. These may include, but are not limited to: failure to achieve desired goals, infection, bleeding, organ or nerve damage, allergic reactions, paralysis, and death. In addition, the patient was informed of those risks and complications associated to Spine-related procedures, such as failure to decrease pain; infection (i.e.: Meningitis, epidural or intraspinal abscess); bleeding (i.e.: epidural hematoma, subarachnoid hemorrhage, or any other type of intraspinal or peri-dural bleeding); organ or nerve damage (i.e.: Any type of peripheral nerve, nerve root, or spinal cord injury) with subsequent damage to sensory, motor, and/or autonomic systems, resulting in permanent pain, numbness, and/or weakness of one or several areas of the body; allergic reactions; (i.e.: anaphylactic reaction); and/or death. Furthermore, the patient was informed of those risks and complications associated with the medications. These include, but are not limited to: allergic reactions (i.e.: anaphylactic or anaphylactoid reaction(s)); adrenal axis suppression; blood sugar elevation that in diabetics may result in ketoacidosis or comma; water retention that in patients with history of congestive heart failure may result in shortness of breath, pulmonary edema, and decompensation with resultant heart  failure; weight gain; swelling or edema; medication-induced neural toxicity; particulate matter embolism and blood vessel occlusion with resultant organ, and/or nervous system infarction; and/or aseptic necrosis of one or more joints. Finally, the patient was informed that Medicine is not an exact science; therefore, there is also the possibility of unforeseen or unpredictable risks and/or possible complications that may result in a catastrophic outcome. The patient indicated having understood very clearly. We have given the patient no guarantees and we have made no promises. Enough time was given to the patient to ask questions, all of which were answered to the patient's satisfaction. Ms. Cartaya has indicated that she wanted to continue with the procedure. Attestation: I, the ordering provider, attest that I have discussed with the patient the benefits, risks, side-effects, alternatives, likelihood of achieving goals, and potential problems during recovery for the procedure that I have provided informed consent. Date   Time: 01/15/2022 12:25 PM  Pre-Procedure Preparation:  Monitoring: As per clinic protocol. Respiration, ETCO2, SpO2, BP, heart rate and rhythm monitor placed and checked for adequate function Safety Precautions: Patient was assessed for positional comfort and pressure points before starting the procedure. Time-out: I initiated and conducted the "Time-out" before starting the procedure, as per protocol. The patient was asked to participate by confirming the accuracy of the "Time Out" information. Verification of the correct person, site, and procedure were performed and confirmed by me, the nursing staff, and the patient. "Time-out" conducted as per Joint Commission's Universal Protocol (UP.01.01.01). Time: 1337  Description/Narrative of Procedure:          Laterality: Bilateral. The procedure was performed in identical fashion on both sides. Targeted Levels:  C3, C4, C5, C6, Medial Branch  Level(s).  Rationale (medical necessity): procedure needed and proper for the diagnosis and/or treatment of the patient's medical symptoms and needs. Procedural Technique Safety Precautions: Aspiration looking for blood return was conducted prior to all injections. At no point did we inject any substances, as a needle was being advanced. No attempts were made  at seeking any paresthesias. Safe injection practices and needle disposal techniques used. Medications properly checked for expiration dates. SDV (single dose vial) medications used. Description of the Procedure: Protocol guidelines were followed. The patient was assisted into a comfortable position. The target area was identified and the area prepped in the usual manner. Skin & deeper tissues infiltrated with local anesthetic. Appropriate amount of time allowed to pass for local anesthetics to take effect. The procedure needles were then advanced to the target area. Proper needle placement secured. Negative aspiration confirmed. Solution injected in intermittent fashion, asking for systemic symptoms every 0.5cc of injectate. The needles were then removed and the area cleansed, making sure to leave some of the prepping solution back to take advantage of its long term bactericidal properties.  Technical description of process:  C3 Medial Branch Nerve Block (MBB): The target area for the C3 dorsal medial articular branch is the lateral concave waist of the articular pillar of C3. Under fluoroscopic guidance, a Quincke needle was inserted until contact was made with os over the postero-lateral aspect of the articular pillar of C3 (target area). After negative aspiration for blood, 67m of the nerve block solution was injected without difficulty or complication. The needle was removed intact. C4 Medial Branch Nerve Block (MBB): The target area for the C4 dorsal medial articular branch is the lateral concave waist of the articular pillar of C4. Under  fluoroscopic guidance, a Quincke needle was inserted until contact was made with os over the postero-lateral aspect of the articular pillar of C4 (target area). After negative aspiration for blood, 1102mof the nerve block solution was injected without difficulty or complication. The needle was removed intact. C5 Medial Branch Nerve Block (MBB): The target area for the C5 dorsal medial articular branch is the lateral concave waist of the articular pillar of C5. Under fluoroscopic guidance, a Quincke needle was inserted until contact was made with os over the postero-lateral aspect of the articular pillar of C5 (target area). After negative aspiration for blood, 78m52mf the nerve block solution was injected without difficulty or complication. The needle was removed intact. C6 Medial Branch Nerve Block (MBB): The target area for the C6 dorsal medial articular branch is the lateral concave waist of the articular pillar of C6. Under fluoroscopic guidance, a Quincke needle was inserted until contact was made with os over the postero-lateral aspect of the articular pillar of C6 (target area). After negative aspiration for blood, 78mL33m the nerve block solution was injected without difficulty or complication. The needle was removed intact.  8 cc solution made of 6 cc of 0.2% ropivacaine, 2 cc of Decadron 10 mg/cc.  2 cc injected at each level above bilaterally.   Once the entire procedure was completed, the treated area was cleaned, making sure to leave some of the prepping solution back to take advantage of its long term bactericidal properties.  Anatomy Reference Guide:       Vitals:   01/15/22 1341 01/15/22 1346 01/15/22 1354 01/15/22 1404  BP: 126/86 (!) 127/93 (!) 137/99 (!) 135/95  Pulse:      Resp: (!) 24 (!) 25 (!) 27 16  Temp:      TempSrc:      SpO2: 100% 100% 99% 92%  Weight:      Height:         Start Time: 1337 hrs. End Time: 1350 hrs.  Imaging Guidance (Spinal):          Type of  Imaging Technique: Fluoroscopy Guidance (Spinal) Indication(s): Assistance in needle guidance and placement for procedures requiring needle placement in or near specific anatomical locations not easily accessible without such assistance. Exposure Time: Please see nurses notes. Contrast: None used. Fluoroscopic Guidance: I was personally present during the use of fluoroscopy. "Tunnel Vision Technique" used to obtain the best possible view of the target area. Parallax error corrected before commencing the procedure. "Direction-depth-direction" technique used to introduce the needle under continuous pulsed fluoroscopy. Once target was reached, antero-posterior, oblique, and lateral fluoroscopic projection used confirm needle placement in all planes. Images permanently stored in EMR. Interpretation: No contrast injected. I personally interpreted the imaging intraoperatively. Adequate needle placement confirmed in multiple planes. Permanent images saved into the patient's record.  Post-operative Assessment:  Post-procedure Vital Signs:  Pulse/HCG Rate: (!) 115(!) 105 Temp: 98.1 F (36.7 C) Resp: 16 BP: (!) 135/95 SpO2: 92 %  EBL: None  Complications: No immediate post-treatment complications observed by team, or reported by patient.  Note: The patient tolerated the entire procedure well. A repeat set of vitals were taken after the procedure and the patient was kept under observation following institutional policy, for this type of procedure. Post-procedural neurological assessment was performed, showing return to baseline, prior to discharge. The patient was provided with post-procedure discharge instructions, including a section on how to identify potential problems. Should any problems arise concerning this procedure, the patient was given instructions to immediately contact us, at any time, without hesitation. In any case, we plan to contact the patient by telephone for a follow-up status report  regarding this interventional procedure.  Comments:  No additional relevant information.  Plan of Care  Orders:  Orders Placed This Encounter  Procedures   DG PAIN CLINIC C-ARM 1-60 MIN NO REPORT    Intraoperative interpretation by procedural physician at [place].    Standing Status:   Standing    Number of Occurrences:   1    Order Specific Question:   Reason for exam:    Answer:   Assistance in needle guidance and placement for procedures requiring needle placement in or near specific anatomical locations not easily accessible without such assistance.    Medications ordered for procedure: Meds ordered this encounter  Medications   lidocaine (XYLOCAINE) 2 % (with pres) injection 400 mg   dexamethasone (DECADRON) injection 10 mg   dexamethasone (DECADRON) injection 10 mg   ropivacaine (PF) 2 mg/mL (0.2%) (NAROPIN) injection 9 mL   ropivacaine (PF) 2 mg/mL (0.2%) (NAROPIN) injection 9 mL   orphenadrine (NORFLEX) injection 30 mg   cyclobenzaprine (FLEXERIL) 10 MG tablet    Sig: Take 1 tablet (10 mg total) by mouth 3 (three) times daily as needed for muscle spasms.    Dispense:  30 tablet    Refill:  0   Medications administered: We administered lidocaine, dexamethasone, dexamethasone, ropivacaine (PF) 2 mg/mL (0.2%), and ropivacaine (PF) 2 mg/mL (0.2%).  See the medical record for exact dosing, route, and time of administration.  Follow-up plan:   Return in about 4 weeks (around 02/12/2022) for Post Procedure Evaluation, in person.       Status post right L4-L5 ESI #1 on 06/11/2020, #2 10/22/20, bilateral C3, C4, C5 cervical facet medial branch nerve block 07/25/2020 with thoracic TPI: only helped for 24 hrs           Recent Visits Date Type Provider Dept  12/11/21 Office Visit Gillis Santa, MD [facility]  Showing recent visits within past 90  days and meeting all other requirements Today's Visits Date Type Provider Dept  01/15/22 Procedure visit  Gillis Santa, MD [facility]  Showing today's visits and meeting all other requirements Future Appointments No visits were found meeting these conditions. Showing future appointments within next 90 days and meeting all other requirements  Disposition: Discharge home  Discharge (Date   Time): 01/15/2022;   hrs.   Primary Care Physician: Remi Haggard, FNP Location[facility] Note by: Gillis Santa, MD Date: 01/15/2022; Time: 2:08 PM  Disclaimer:  Medicine is not an exact science. The only guarantee in medicine is that nothing is guaranteed. It is important to note that the decision to proceed with this intervention was based on the information collected from the patient. The Data and conclusions were drawn from the patient's questionnaire, the interview, and the physical examination. Because the information was provided in large part by the patient, it cannot be guaranteed that it has not been purposely or unconsciously manipulated. Every effort has been made to obtain as much relevant data as possible for this evaluation. It is important to note that the conclusions that lead to this procedure are derived in large part from the available data. Always take into account that the treatment will also be dependent on availability of resources and existing treatment guidelines, considered by other Pain Management Practitioners as being common knowledge and practice, at the time of the intervention. For Medico-Legal purposes, it is also important to point out that variation in procedural techniques and pharmacological choices are the acceptable norm. The indications, contraindications, technique, and results of the above procedure should only be interpreted and judged by a Board-Certified Interventional Pain Specialist with extensive familiarity and expertise in the same exact procedure and technique.

## 2022-01-15 NOTE — Progress Notes (Signed)
Safety precautions to be maintained throughout the outpatient stay will include: orient to surroundings, keep bed in low position, maintain call bell within reach at all times, provide assistance with transfer out of bed and ambulation.  

## 2022-01-16 ENCOUNTER — Telehealth: Payer: Self-pay

## 2022-01-16 NOTE — Telephone Encounter (Signed)
Pt was called concerning her procedure on yesterday. No problems was reported.  ?

## 2022-01-17 ENCOUNTER — Telehealth: Payer: Self-pay | Admitting: Student in an Organized Health Care Education/Training Program

## 2022-01-17 NOTE — Telephone Encounter (Signed)
Spoke with patient.  Informed her that the pain can come back after the numbing agent wears off but that she had to give it several days for the steroid to kick in.  Instructed her to call us back if the pain increased or if she does not get any relief. Patient states understanding ?

## 2022-01-20 ENCOUNTER — Telehealth: Payer: Self-pay | Admitting: Student in an Organized Health Care Education/Training Program

## 2022-01-20 NOTE — Telephone Encounter (Signed)
Closing message no return call back from patient.  ?

## 2022-01-22 ENCOUNTER — Telehealth: Payer: Self-pay

## 2022-01-22 NOTE — Telephone Encounter (Signed)
Patient called back, Colleen [name] had talked to Dr Holley Raring on yesterday about patient c/o.  She states that he said we would be glad to see her tomorrow, 01/23/22 for assessment if she feels that is warranted, however he does not feel that this leg pain has anything to do with last injection.  States, she does have issues with lower back and possibly it could be coming from that.  She has a f/up appt on 02/13/22.   ?

## 2022-01-22 NOTE — Telephone Encounter (Signed)
Left message for patient to call office to discuss message.  ?

## 2022-01-22 NOTE — Telephone Encounter (Signed)
Spoke with Dr Holley Raring about patient complaint.  States we can bring her in for an evaluation if she would like.  Attempted to call again and I left a message to call us back.   ?

## 2022-01-24 ENCOUNTER — Other Ambulatory Visit: Payer: Self-pay | Admitting: Family Medicine

## 2022-01-24 DIAGNOSIS — Z1231 Encounter for screening mammogram for malignant neoplasm of breast: Secondary | ICD-10-CM

## 2022-01-28 ENCOUNTER — Emergency Department

## 2022-01-28 ENCOUNTER — Encounter: Payer: Self-pay | Admitting: Intensive Care

## 2022-01-28 ENCOUNTER — Other Ambulatory Visit: Payer: Self-pay

## 2022-01-28 ENCOUNTER — Telehealth: Payer: Self-pay | Admitting: Psychiatry

## 2022-01-28 ENCOUNTER — Emergency Department
Admission: EM | Admit: 2022-01-28 | Discharge: 2022-01-28 | Disposition: A | Discharge status: Home / Self Care | Attending: Emergency Medicine | Admitting: Emergency Medicine

## 2022-01-28 DIAGNOSIS — R0602 Shortness of breath: Secondary | ICD-10-CM

## 2022-01-28 DIAGNOSIS — F410 Panic disorder [episodic paroxysmal anxiety] without agoraphobia: Secondary | ICD-10-CM

## 2022-01-28 DIAGNOSIS — J449 Chronic obstructive pulmonary disease, unspecified: Secondary | ICD-10-CM | POA: Insufficient documentation

## 2022-01-28 DIAGNOSIS — F419 Anxiety disorder, unspecified: Secondary | ICD-10-CM

## 2022-01-28 LAB — CBC WITH DIFFERENTIAL/PLATELET
Abs Immature Granulocytes: 0.02 10*3/uL (ref 0.00–0.07)
Basophils Absolute: 0.1 10*3/uL (ref 0.0–0.1)
Basophils Relative: 1 %
Eosinophils Absolute: 0.1 10*3/uL (ref 0.0–0.5)
Eosinophils Relative: 1 %
HCT: 42.5 % (ref 36.0–46.0)
Hemoglobin: 13.9 g/dL (ref 12.0–15.0)
Immature Granulocytes: 0 %
Lymphocytes Relative: 21 %
Lymphs Abs: 1.5 10*3/uL (ref 0.7–4.0)
MCH: 29.1 pg (ref 26.0–34.0)
MCHC: 32.7 g/dL (ref 30.0–36.0)
MCV: 89.1 fL (ref 80.0–100.0)
Monocytes Absolute: 0.5 10*3/uL (ref 0.1–1.0)
Monocytes Relative: 7 %
Neutro Abs: 5.2 10*3/uL (ref 1.7–7.7)
Neutrophils Relative %: 70 %
Platelets: 286 10*3/uL (ref 150–400)
RBC: 4.77 MIL/uL (ref 3.87–5.11)
RDW: 13.7 % (ref 11.5–15.5)
WBC: 7.4 10*3/uL (ref 4.0–10.5)
nRBC: 0 % (ref 0.0–0.2)

## 2022-01-28 LAB — COMPREHENSIVE METABOLIC PANEL
ALT: 12 U/L (ref 0–44)
AST: 14 U/L — ABNORMAL LOW (ref 15–41)
Albumin: 4 g/dL (ref 3.5–5.0)
Alkaline Phosphatase: 38 U/L (ref 38–126)
Anion gap: 7 (ref 5–15)
BUN: 6 mg/dL — ABNORMAL LOW (ref 8–23)
CO2: 30 mmol/L (ref 22–32)
Calcium: 8.9 mg/dL (ref 8.9–10.3)
Chloride: 94 mmol/L — ABNORMAL LOW (ref 98–111)
Creatinine, Ser: 0.47 mg/dL (ref 0.44–1.00)
GFR, Estimated: >60 mL/min (ref >60)
Glucose, Bld: 139 mg/dL — ABNORMAL HIGH (ref 70–99)
Potassium: 4.1 mmol/L (ref 3.5–5.1)
Sodium: 131 mmol/L — ABNORMAL LOW (ref 135–145)
Total Bilirubin: 0.6 mg/dL (ref 0.3–1.2)
Total Protein: 7.3 g/dL (ref 6.5–8.1)

## 2022-01-28 IMAGING — CR DG CHEST 2V
1 series · 2 of 2 positions shown · non-contrast
Comparison: [DATE]

CLINICAL DATA: Shortness of breath.

EXAM:
CHEST - 2 VIEW

[Series 1: dg chest 2 view · 0.14mm/px · 2 of 2 slices shown]
[im 1/2]
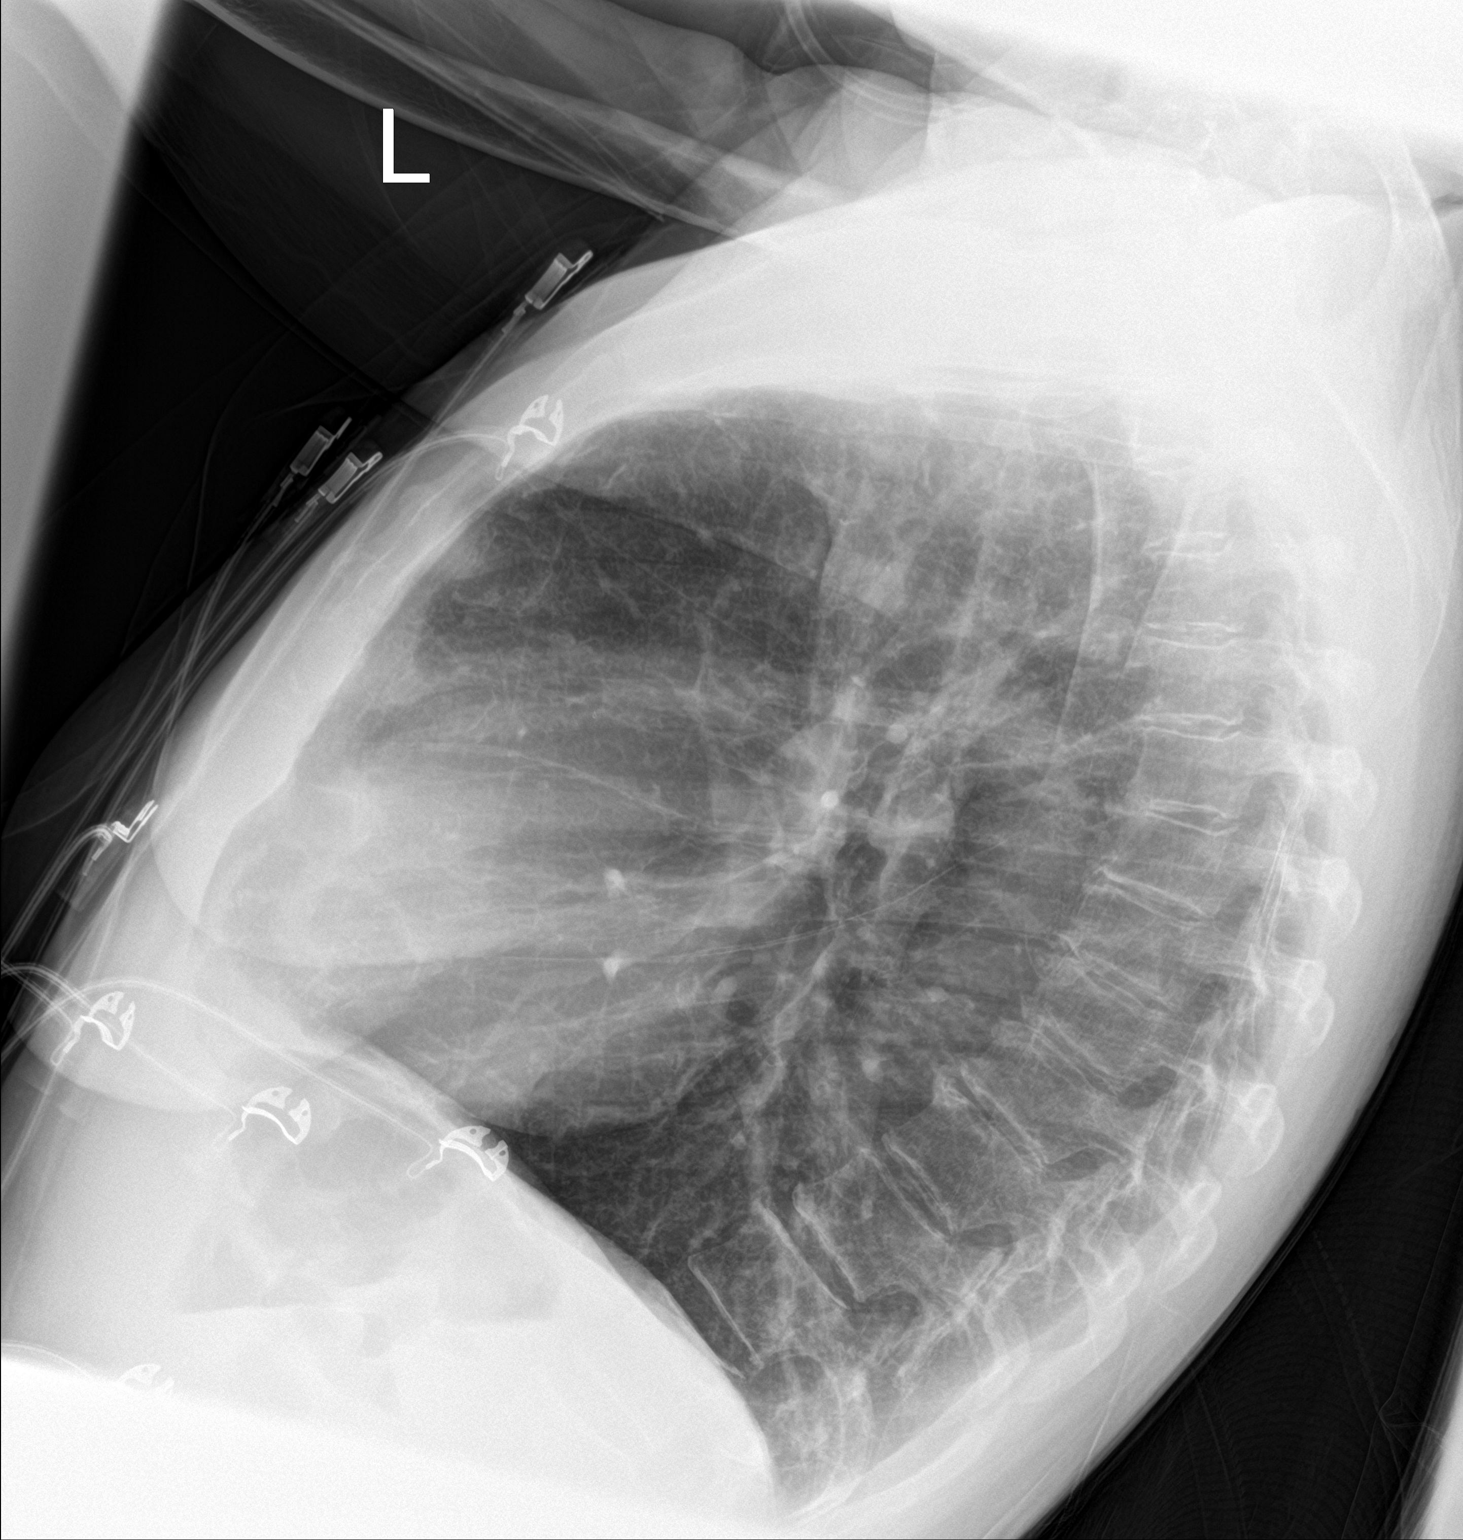
[im 2/2]
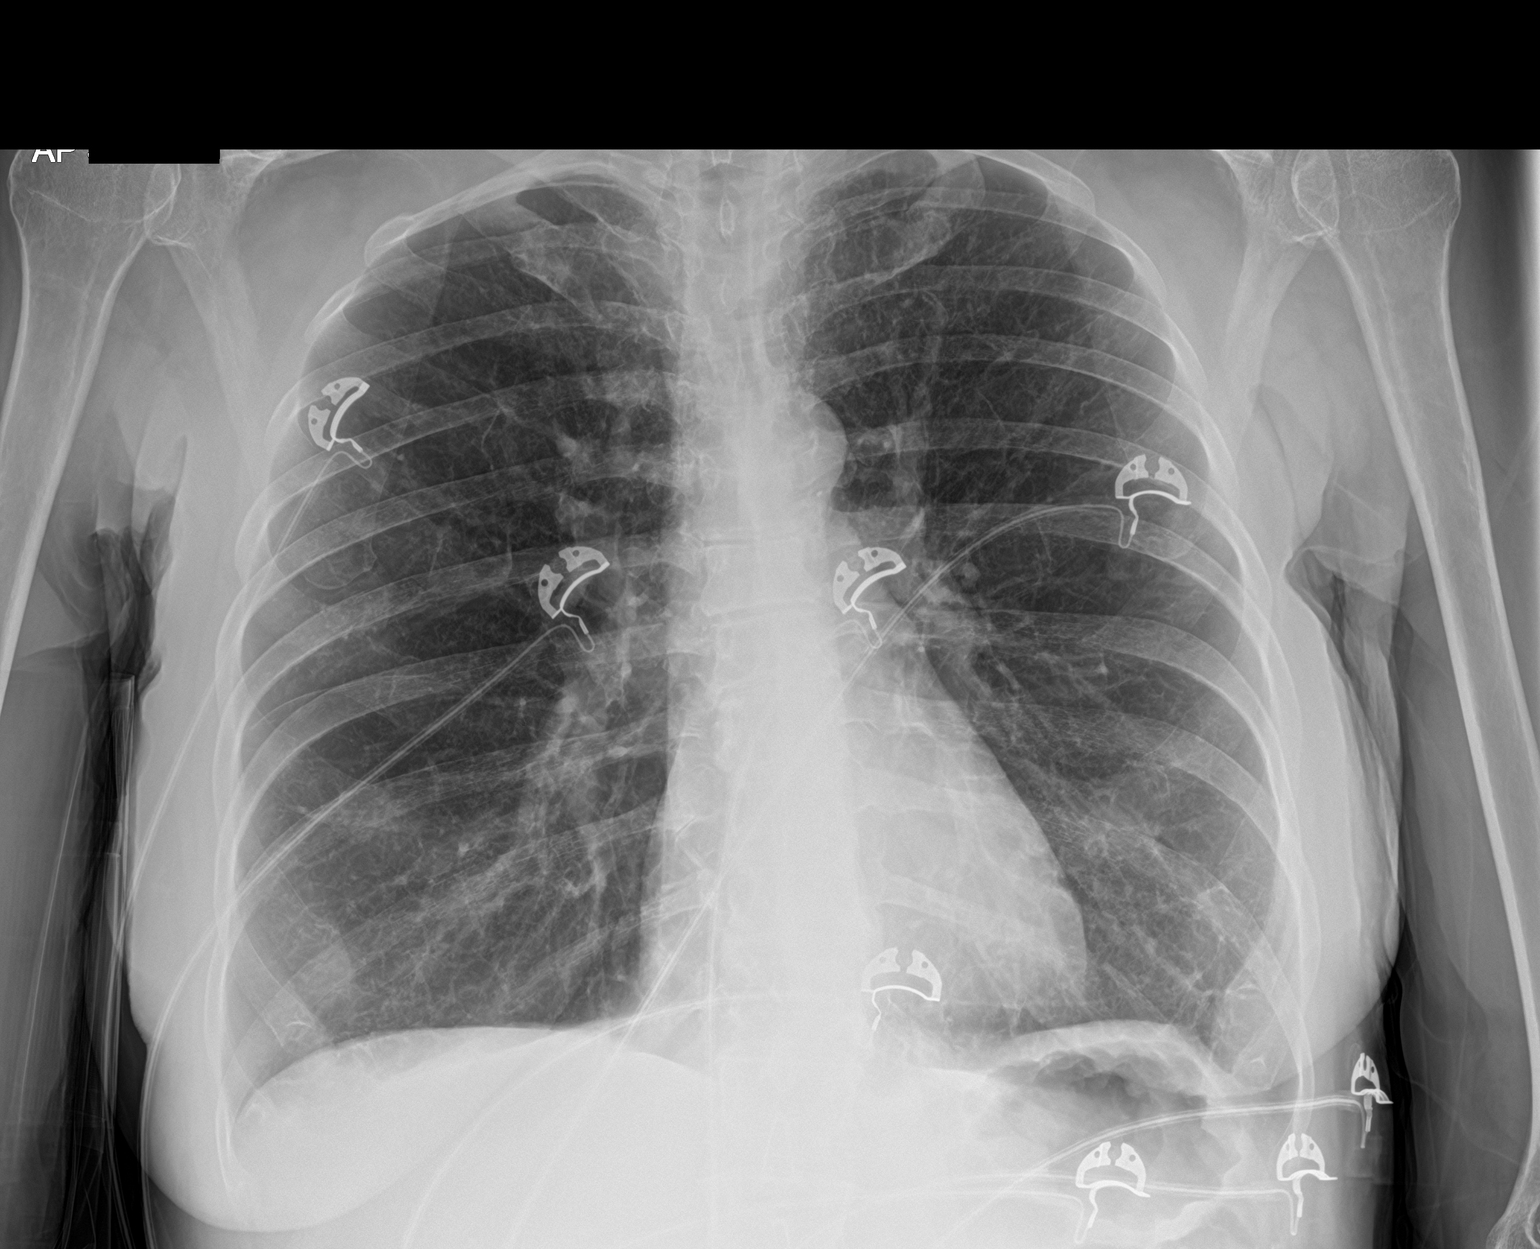

[2 of 2 positions shown; findings below may reference images not displayed]

FINDINGS: Numerous leads and wires project over the chest. Midline trachea.
Normal heart size. No pleural effusion or pneumothorax.
Hyperinflation and interstitial coarsening, consistent with the
clinical history of COPD. Clear lungs.
IMPRESSION: No acute cardiopulmonary disease.

Hyperinflation and interstitial thickening, consistent with
COPD/chronic bronchitis.

## 2022-01-28 MED ORDER — ALBUTEROL SULFATE (2.5 MG/3ML) 0.083% IN NEBU: 2.5000 mg | INHALATION_SOLUTION | RESPIRATORY_TRACT | Status: DC | PRN

## 2022-01-28 MED ORDER — DIAZEPAM 5 MG/ML IJ SOLN
5.0000 mg | Freq: Once | INTRAMUSCULAR | Status: AC
  Administered 2022-01-28: 5 mg via INTRAVENOUS
  Filled 2022-01-28: qty 2

## 2022-01-28 NOTE — ED Notes (Signed)
Pt resting in stretcher with visitor x1 at bedside, pt offered breathing treatment but refused. States her anxiety and panic attack is what's making it hard for her to breathe. Pt states she took her medications this morning, but it's not working and she reports feeling very anxious. Denies other complaints at this time. ?

## 2022-01-28 NOTE — ED Notes (Signed)
Pt remains resting in bed, states no changes to feelings of anxiety and agitation. Pt calm and cooperative with RN. Visitor x1 remains at bedside. Denies needs at this time. ?

## 2022-01-28 NOTE — ED Provider Notes (Signed)
? ?[facility] ?Provider Note ? ? Event Date/Time  ? First MD Initiated Contact with Patient 01/28/22 1607   ?  (approximate) ?History  ?Shortness of Breath and Panic Attack ? ?HPI ?Colleen [name] is a 61 y.o. female with a stated past medical history of COPD, depression/anxiety, PTSD, and porosis who presents for increased anxiety over the last 3-4 days with worsening shortness of breath over this time as well.  Patient states that she wears 2-3 L of oxygen via nasal cannula daily and has not had to increase her oxygen recently but does state that she feels like she cannot get enough air in.  Patient denies missing any doses of her regular scheduled COPD medication.  Patient denies any increased life stressors.  Patient currently denies any SI, HI, AVH ?Physical Exam  ?Triage Vital Signs: ?ED Triage Vitals  ?Enc Vitals Group  ?   BP 01/28/22 1555 (!) 144/102  ?   Pulse Rate 01/28/22 1555 (!) 111  ?   Resp 01/28/22 1555 (!) 24  ?   Temp 01/28/22 1555 98.3 ?F (36.8 ?C)  ?   Temp src --   ?   SpO2 01/28/22 1555 93 %  ?   Weight 01/28/22 1559 121 lb (54.9 kg)  ?   Height 01/28/22 1559 '5\' 1"'$  (1.549 m)  ?   Head Circumference --   ?   Peak Flow --   ?   Pain Score 01/28/22 1559 0  ?   Pain Loc --   ?   Pain Edu? --   ?   Excl. in [place]? --   ? ?Most recent vital signs: ?Vitals:  ? 01/28/22 1730 01/28/22 1800  ?BP: 122/85 121/86  ?Pulse: 99 98  ?Resp: 19 (!) 26  ?Temp:    ?SpO2: 96% 94%  ? ?General: Awake, oriented x4. ?CV:  Good peripheral perfusion.  ?Resp:  Normal effort.  Mild end expiratory wheezes over bilateral lung fields ?Abd:  No distention.  ?Other:  Middle-aged Caucasian female laying in bed in no distress with 2 L by nasal cannula in place ?ED Results / Procedures / Treatments  ?Labs ?(all labs ordered are listed, but only abnormal results are displayed) ?Labs Reviewed  ?COMPREHENSIVE METABOLIC PANEL - Abnormal; Notable for the following components:  ?    Result Value  ? Sodium 131 (*)   ?  Chloride 94 (*)   ? Glucose, Bld 139 (*)   ? BUN 6 (*)   ? AST 14 (*)   ? All other components within normal limits  ?CBC WITH DIFFERENTIAL/PLATELET  ? ?EKG ?ED ECG REPORT ?I, [name], the attending physician, personally viewed and interpreted this ECG. ?Date: 01/28/2022 ?EKG Time: 1603 ?Rate: 110 ?Rhythm: normal sinus rhythm ?QRS Axis: normal ?Intervals: normal ?ST/T Wave abnormalities: normal ?Narrative Interpretation: no evidence of acute ischemia ?RADIOLOGY ?ED MD interpretation: 2 view chest x-ray interpreted by me shows no evidence of acute abnormalities including no pneumonia, pneumothorax, or widened mediastinum.  Hyperinflation and interstitial thickening consistent with COPD/chronic bronchitis ?-Agree with radiology assessment ?Official radiology report(s): ?DG Chest 2 View ? ?Result Date: 01/28/2022 ?CLINICAL DATA:  Shortness of breath. EXAM: CHEST - 2 VIEW COMPARISON:  01/07/2022 FINDINGS: Numerous leads and wires project over the chest. Midline trachea. Normal heart size. No pleural effusion or pneumothorax. Hyperinflation and interstitial coarsening, consistent with the clinical history of COPD. Clear lungs. IMPRESSION: No acute cardiopulmonary disease. Hyperinflation and interstitial thickening, consistent with COPD/chronic bronchitis. Electronically Signed  By: Abigail Miyamoto M.D.   On: 01/28/2022 16:32   ?PROCEDURES: ?Critical Care performed: No ?.1-3 Lead EKG Interpretation ?Performed by: [name], MD ?Authorized by: [name], MD  ? ?  Interpretation: normal   ?  ECG rate:  97 ?  ECG rate assessment: normal   ?  Rhythm: sinus rhythm   ?  Ectopy: none   ?  Conduction: normal   ?MEDICATIONS ORDERED IN ED: ?Medications  ?albuterol (PROVENTIL) (2.5 MG/3ML) 0.083% nebulizer solution 2.5 mg (has no administration in time range)  ?diazepam (VALIUM) injection 5 mg (5 mg Intravenous Given 01/28/22 1707)  ? ?IMPRESSION / MDM / ASSESSMENT AND PLAN / ED COURSE  ?I reviewed the triage vital  signs and the nursing notes. ?             ?               ?The patient is on the cardiac monitor to evaluate for evidence of arrhythmia and/or significant heart rate changes. ?This patient presents with symptoms consistent with acute anxiety reaction / panic attack. Low suspicion for acute cardiopulmonary process including ACS, PE, or thoracic aortic dissection. Denies any ingestions or any other medical complaints. No evidence of alcohol withdrawal symptoms. Presentation not consistent with overt toxidrome, ingestion given history & physical. Presentation not consistent with organic or medical emergency at this time. No acute indication for psychiatric consultation (without SI/HI, AH/VH). Cautious return precautions discussed with full understanding. ? ?Plan: Psych follow up PRN ?Dispo: Discharge ? ?  ?FINAL CLINICAL IMPRESSION(S) / ED DIAGNOSES  ? ?Final diagnoses:  ?Anxiety  ?Panic attack  ?Shortness of breath  ? ?Rx / DC Orders  ? ?ED Discharge Orders   ? ? None  ? ?  ? ?Note:  This document was prepared using Dragon voice recognition software and may include unintentional dictation errors. ?  ?[name], MD ?01/28/22 1850 ? ?

## 2022-01-28 NOTE — Telephone Encounter (Signed)
Left message for pt. After receiving VM requesting earlier appt.  ?

## 2022-01-28 NOTE — ED Notes (Signed)
RN again called to bedside. Pt informed that psychiatry probably won't be in to see her until approx 2030-2100. Pt states she doesn't want to wait and she would rather go home. MD notified, and okay with patient leaving with family member. Pt cooperative with RN. ?

## 2022-01-28 NOTE — ED Triage Notes (Addendum)
Patient reports she started having a panic attack X3-4 days ago with it progressively worsening and has not subsided. Reports the panic attack is making her feel sob. Continuously wears 2-3L O2 daily. HX breast cancer with left sided lymph node removal. ?

## 2022-01-28 NOTE — ED Notes (Signed)
RN called to bedside. Pt requesting an update on when psychiatry will come to see her. RN unable to give accurate estimate, so informed pt that the MD will be notified of her request. Pt states that medication has had no effect on her anxiety and if it's going to be a long time that she would rather just go home. Visitor x1 remains at bedside. ?

## 2022-01-29 ENCOUNTER — Telehealth: Payer: Self-pay | Admitting: Psychiatry

## 2022-01-29 DIAGNOSIS — G4701 Insomnia due to medical condition: Secondary | ICD-10-CM

## 2022-01-29 DIAGNOSIS — F4310 Post-traumatic stress disorder, unspecified: Secondary | ICD-10-CM

## 2022-01-29 DIAGNOSIS — F411 Generalized anxiety disorder: Secondary | ICD-10-CM

## 2022-01-29 MED ORDER — DIAZEPAM 2 MG PO TABS: 2.0000 mg | ORAL_TABLET | Freq: Every day | ORAL | 2 refills | Status: DC | PRN

## 2022-01-29 NOTE — Telephone Encounter (Signed)
I have sent Valium 15-day supply with 2 refills to her pharmacy . ? ?I have not seen this patient in a while and will not be able to make medication changes without an appointment. ? ?If this patient is not doing well we will consider setting her up with Saturday clinic if there is sooner available appointments. ? ?Will have Janett Billow CMA contact this patient to discuss. ?

## 2022-01-29 NOTE — Telephone Encounter (Signed)
Patient called yesterday and left VM  requesting to speak with Dr. Shea Evans or to reschedule her current appointment on April 13 to an earlier date due to increased anxiety. She returned that VM today around 2 pm stating she had to go to the ER yesterday and received an injection of valium. First available appointment is May 1st. Stated she was feeling better and elected to keep the appointment in April then  stated she was running out of valium. Transferred her call to the CMA to follow up on the med request. ?

## 2022-01-30 ENCOUNTER — Telehealth: Payer: Self-pay | Admitting: Student in an Organized Health Care Education/Training Program

## 2022-01-30 NOTE — Telephone Encounter (Signed)
Attempted to return call, message left. 

## 2022-01-30 NOTE — Telephone Encounter (Signed)
Patient states she is having severe pain in tailbone area. She wants to see if she can get something sent in or have injections Monday. Please advise patient ?

## 2022-02-09 ENCOUNTER — Other Ambulatory Visit: Payer: Self-pay | Admitting: Student in an Organized Health Care Education/Training Program

## 2022-02-10 ENCOUNTER — Ambulatory Visit (HOSPITAL_BASED_OUTPATIENT_CLINIC_OR_DEPARTMENT_OTHER): Admitting: Student in an Organized Health Care Education/Training Program

## 2022-02-10 ENCOUNTER — Encounter: Payer: Self-pay | Admitting: Student in an Organized Health Care Education/Training Program

## 2022-02-10 ENCOUNTER — Ambulatory Visit
Admission: RE | Admit: 2022-02-10 | Discharge: 2022-02-10 | Disposition: A | Discharge status: Home / Self Care | Source: Ambulatory Visit | Attending: Student in an Organized Health Care Education/Training Program | Admitting: Student in an Organized Health Care Education/Training Program

## 2022-02-10 VITALS — BP 150/100 | HR 118 | Temp 97.2°F | Resp 16 | Ht 60.0 in | Wt 121.0 lb

## 2022-02-10 DIAGNOSIS — G894 Chronic pain syndrome: Secondary | ICD-10-CM | POA: Insufficient documentation

## 2022-02-10 DIAGNOSIS — M5416 Radiculopathy, lumbar region: Secondary | ICD-10-CM | POA: Insufficient documentation

## 2022-02-10 DIAGNOSIS — M5126 Other intervertebral disc displacement, lumbar region: Secondary | ICD-10-CM | POA: Insufficient documentation

## 2022-02-10 MED ORDER — ROPIVACAINE HCL 2 MG/ML IJ SOLN
2.0000 mL | Freq: Once | INTRAMUSCULAR | Status: AC
  Administered 2022-02-10: 2 mL via EPIDURAL

## 2022-02-10 MED ORDER — IOHEXOL 180 MG/ML  SOLN
10.0000 mL | Freq: Once | INTRAMUSCULAR | Status: AC
  Administered 2022-02-10: 10 mL via EPIDURAL

## 2022-02-10 MED ORDER — LIDOCAINE HCL 2 % IJ SOLN
20.0000 mL | Freq: Once | INTRAMUSCULAR | Status: AC
  Administered 2022-02-10: 100 mg

## 2022-02-10 MED ORDER — DEXAMETHASONE SODIUM PHOSPHATE 10 MG/ML IJ SOLN
10.0000 mg | Freq: Once | INTRAMUSCULAR | Status: AC
  Administered 2022-02-10: 10 mg

## 2022-02-10 MED ORDER — SODIUM CHLORIDE (PF) 0.9 % IJ SOLN
INTRAMUSCULAR | Status: AC
  Filled 2022-02-10: qty 10

## 2022-02-10 MED ORDER — LIDOCAINE HCL (PF) 2 % IJ SOLN
INTRAMUSCULAR | Status: AC
  Filled 2022-02-10: qty 10

## 2022-02-10 MED ORDER — ROPIVACAINE HCL 2 MG/ML IJ SOLN
INTRAMUSCULAR | Status: AC
  Filled 2022-02-10: qty 20

## 2022-02-10 MED ORDER — SODIUM CHLORIDE 0.9% FLUSH
2.0000 mL | Freq: Once | INTRAVENOUS | Status: AC
  Administered 2022-02-10: 2 mL

## 2022-02-10 MED ORDER — DEXAMETHASONE SODIUM PHOSPHATE 10 MG/ML IJ SOLN
INTRAMUSCULAR | Status: AC
  Filled 2022-02-10: qty 1

## 2022-02-10 NOTE — Progress Notes (Signed)
Safety precautions to be maintained throughout the outpatient stay will include: orient to surroundings, keep bed in low position, maintain call bell within reach at all times, provide assistance with transfer out of bed and ambulation.  

## 2022-02-10 NOTE — Patient Instructions (Addendum)
Please call  ? ?Colleen [name] ?Chiropractor in [place], [place] ?Address: [address], [place], [place] [address] ?Open ? Closes 6?PM ? ?______________________________________________________________________________________ ? ?Post-Procedure Discharge Instructions ? ?Instructions: ?Apply ice:  ?Purpose: This will minimize any swelling and discomfort after procedure.  ?When: Day of procedure, as soon as you get home. ?How: Fill a plastic sandwich bag with crushed ice. Cover it with a small towel and apply to injection site. ?How long: (15 min on, 15 min off) Apply for 15 minutes then remove x 15 minutes.  Repeat sequence on day of procedure, until you go to bed. ?Apply heat:  ?Purpose: To treat any soreness and discomfort from the procedure. ?When: Starting the next day after the procedure. ?How: Apply heat to procedure site starting the day following the procedure. ?How long: May continue to repeat daily, until discomfort goes away. ?Food intake: Start with clear liquids (like water) and advance to regular food, as tolerated.  ?Physical activities: Keep activities to a minimum for the first 8 hours after the procedure. After that, then as tolerated. ?Driving: If you have received any sedation, be responsible and do not drive. You are not allowed to drive for 24 hours after having sedation. ?Blood thinner: (Applies only to those taking blood thinners) You may restart your blood thinner 6 hours after your procedure. ?Insulin: (Applies only to Diabetic patients taking insulin) As soon as you can eat, you may resume your normal dosing schedule. ?Infection prevention: Keep procedure site clean and dry. Shower daily and clean area with soap and water. ?Post-procedure Pain Diary: Extremely important that this be done correctly and accurately. Recorded information will be used to determine the next step in treatment. For the purpose of accuracy, follow these rules: ?Evaluate only the area treated.  Do not report or include pain from an untreated area. For the purpose of this evaluation, ignore all other areas of pain, except for the treated area. ?After your procedure, avoid taking a long nap and attempting to complete the pain diary after you wake up. Instead, set your alarm clock to go off every hour, on the hour, for the initial 8 hours after the procedure. Document the duration of the numbing medicine, and the relief you are getting from it. ?Do not go to sleep and attempt to complete it later. It will not be accurate. If you received sedation, it is likely that you were given a medication that may cause amnesia. Because of this, completing the diary at a later time may cause the information to be inaccurate. This information is needed to plan your care. ?Follow-up appointment: Keep your post-procedure follow-up evaluation appointment after the procedure (usually 2 weeks for most procedures, 6 weeks for radiofrequencies). DO NOT FORGET to bring you pain diary with you.  ? ?Expect: (What should I expect to see with my procedure?) ?From numbing medicine (AKA: Local Anesthetics): Numbness or decrease in pain. You may also experience some weakness, which if present, could last for the duration of the local anesthetic. ?Onset: Full effect within 15 minutes of injected. ?Duration: It will depend on the type of local anesthetic used. On the average, 1 to 8 hours.  ?From steroids (Applies only if steroids were used): Decrease in swelling or inflammation. Once inflammation is improved, relief of the pain will follow. ?Onset of benefits: Depends on the amount of swelling present. The more swelling, the longer it will take for the benefits to be seen. In some cases, up to 10  days. ?Duration: Steroids will stay in the system x 2 weeks. Duration of benefits will depend on multiple posibilities including persistent irritating factors. ?Side-effects: If present, they may typically last 2 weeks (the duration of the  steroids). ?Frequent: Cramps (if they occur, drink Gatorade and take over-the-counter Magnesium 450-500 mg once to twice a day); water retention with temporary weight gain; increases in blood sugar; decreased immune system response; increased appetite. ?Occasional: Facial flushing (red, warm cheeks); mood swings; menstrual changes. ?Uncommon: Long-term decrease or suppression of natural hormones; bone thinning. (These are more common with higher doses or more frequent use. This is why we prefer that our patients avoid having any injection therapies in other practices.)  ?Very Rare: Severe mood changes; psychosis; aseptic necrosis. ?From procedure: Some discomfort is to be expected once the numbing medicine wears off. This should be minimal if ice and heat are applied as instructed. ? ?Call if: (When should I call?) ?You experience numbness and weakness that gets worse with time, as opposed to wearing off. ?New onset bowel or bladder incontinence. (Applies only to procedures done in the spine) ? ?Emergency Numbers: ?[facility] business hours (Monday - Thursday, 8:00 AM - 4:00 PM) (Friday, 9:00 AM - 12:00 Noon): [phone] ?After hours: [phone] ?NOTE: If you are having a problem and are unable connect with, or to talk to a provider, then go to your nearest urgent care or emergency department. If the problem is serious and urgent, please call 911. ?____________________________________________________________________________________________ ?  ?

## 2022-02-10 NOTE — Progress Notes (Signed)
PROVIDER NOTE: Information contained herein reflects review and annotations entered in association with encounter. Interpretation of such information and data should be left to medically-trained personnel. Information provided to patient can be located elsewhere in the medical record under "Patient Instructions". Document created using STT-dictation technology, any transcriptional errors that may result from process are unintentional.  ?  ?Patient: Divya Munshi  Service Category: Procedure  Provider: Gillis Santa, MD  ?DOB: 06-Feb-1961  DOS: 02/10/2022  Location: [facility]  ?MRN: [id number]  Setting: Ambulatory - outpatient  Referring Provider: Remi Haggard, FNP  ?Type: Established Patient  Specialty: Interventional Pain Management  PCP: Remi Haggard, FNP  ? ?Primary Reason for Visit: Interventional Pain Management Treatment. ?CC: Back Pain (lower) ? ? ?Procedure:          Anesthesia, Analgesia, Anxiolysis:  ?Type: Therapeutic Inter-Laminar Epidural Steroid Injection  #1 in 2023 (s/p 3 in 2022) ?Region: Lumbar ?Level: L4-5 Level. ?Laterality: Right-Sided         Type: Local Anesthesia  ? ?Local Anesthetic: Lidocaine 1-2% ? ?Position: Prone with head of the table was raised to facilitate breathing.  ? ?Indications: ?1. Lumbar radiculopathy   ?2. 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 Chronic pain syndrome   ? ? ?Pain Score: ?Pre-procedure: 10-Worst pain ever/10 ?Post-procedure: 5/10  ? ?Pre-op Assessment:  ?Ms. Apfel is a 61 y.o. (year old), female patient, seen today for interventional treatment. She  has a past surgical history that includes Nephrectomy (Left); Mastectomy (Left); Tubal ligation; Mastectomy; Breast biopsy (Left, 2004); Breast biopsy (Right); Breast lumpectomy (Left, 2004); and LEFT HEART CATH AND CORONARY ANGIOGRAPHY (N/A, 11/06/2021). Ms. Stelzer has a current medication list which includes the following prescription(s): albuterol, citalopram, cyclobenzaprine,  denosumab, diazepam, diazepam, duloxetine, epinephrine, fluticasone, trelegy ellipta, hydrocortisone, ipratropium, mirtazapine, and quetiapine. Her primarily concern today is the Back Pain (lower) ? ? ?Initial Vital Signs:  ?Pulse/HCG Rate: (!) 118ECG Heart Rate: (!) 112 ?Temp: (!) 97.2 ?F (36.2 ?C) ?Resp: 16 ?BP: (!) 126/100 ?SpO2: 90 % (2 liters patients on) ? ?BMI: Estimated body mass index is 23.63 kg/m? as calculated from the following: ?  Height as of this encounter: 5' (1.524 m). ?  Weight as of this encounter: 121 lb (54.9 kg). ? ?Risk Assessment: ?Allergies: Reviewed. She is allergic to desvenlafaxine, morphine and related, penicillins, prazosin, tamoxifen, trazodone, clonazepam, codeine, duloxetine, gabapentin, hydroxyzine, lorazepam, paroxetine hcl, sulfa antibiotics, and cephalexin.  ?Allergy Precautions: None required ?Coagulopathies: Reviewed. None identified.  ?Blood-thinner therapy: None at this time ?Active Infection(s): Reviewed. None identified. Ms. Baria is afebrile ? ?Site Confirmation: Ms. Lisowski was asked to confirm the procedure and laterality before marking the site ?Procedure checklist: Completed ?Consent: Before the procedure and under the influence of no sedative(s), amnesic(s), or anxiolytics, the patient was informed of the treatment options, risks and possible complications. To fulfill our ethical and legal obligations, as recommended by the American Medical Association's Code of Ethics, I have informed the patient of my clinical impression; the nature and purpose of the treatment or procedure; the risks, benefits, and possible complications of the intervention; the alternatives, including doing nothing; the risk(s) and benefit(s) of the alternative treatment(s) or procedure(s); and the risk(s) and benefit(s) of doing nothing. ?The patient was provided information about the general risks and possible complications associated with the procedure. These may include, but are not limited  to: failure to achieve desired goals, infection, bleeding, organ or nerve damage, allergic reactions, paralysis, and death. ?In addition, the patient was  informed of those risks and complications associated to Spine-related procedures, such as failure to decrease pain; infection (i.e.: Meningitis, epidural or intraspinal abscess); bleeding (i.e.: epidural hematoma, subarachnoid hemorrhage, or any other type of intraspinal or peri-dural bleeding); organ or nerve damage (i.e.: Any type of peripheral nerve, nerve root, or spinal cord injury) with subsequent damage to sensory, motor, and/or autonomic systems, resulting in permanent pain, numbness, and/or weakness of one or several areas of the body; allergic reactions; (i.e.: anaphylactic reaction); and/or death. ?Furthermore, the patient was informed of those risks and complications associated with the medications. These include, but are not limited to: allergic reactions (i.e.: anaphylactic or anaphylactoid reaction(s)); adrenal axis suppression; blood sugar elevation that in diabetics may result in ketoacidosis or comma; water retention that in patients with history of congestive heart failure may result in shortness of breath, pulmonary edema, and decompensation with resultant heart failure; weight gain; swelling or edema; medication-induced neural toxicity; particulate matter embolism and blood vessel occlusion with resultant organ, and/or nervous system infarction; and/or aseptic necrosis of one or more joints. ?Finally, the patient was informed that Medicine is not an exact science; therefore, there is also the possibility of unforeseen or unpredictable risks and/or possible complications that may result in a catastrophic outcome. The patient indicated having understood very clearly. We have given the patient no guarantees and we have made no promises. Enough time was given to the patient to ask questions, all of which were answered to the patient's satisfaction.  Ms. Rhine has indicated that she wanted to continue with the procedure. ?Attestation: I, the ordering provider, attest that I have discussed with the patient the benefits, risks, side-effects, alternatives, likelihood of achieving goals, and potential problems during recovery for the procedure that I have provided informed consent. ?Date  Time: 02/10/2022 10:56 AM ? ?Pre-Procedure Preparation:  ?Monitoring: As per clinic protocol. Respiration, ETCO2, SpO2, BP, heart rate and rhythm monitor placed and checked for adequate function ?Safety Precautions: Patient was assessed for positional comfort and pressure points before starting the procedure. ?Time-out: I initiated and conducted the "Time-out" before starting the procedure, as per protocol. The patient was asked to participate by confirming the accuracy of the "Time Out" information. Verification of the correct person, site, and procedure were performed and confirmed by me, the nursing staff, and the patient. "Time-out" conducted as per Joint Commission's Universal Protocol (UP.01.01.01). ?Time: 1130 ? ?Description of Procedure:          ?Target Area: The interlaminar space, initially targeting the lower laminar border of the superior vertebral body. ?Approach: Paramedial approach. ?Area Prepped: Entire Posterior Lumbar Region ?DuraPrep (Iodine Povacrylex [0.7% available iodine] and Isopropyl Alcohol, 74% w/w) ?Safety Precautions: Aspiration looking for blood return was conducted prior to all injections. At no point did we inject any substances, as a needle was being advanced. No attempts were made at seeking any paresthesias. Safe injection practices and needle disposal techniques used. Medications properly checked for expiration dates. SDV (single dose vial) medications used. ?Description of the Procedure: Protocol guidelines were followed. The procedure needle was introduced through the skin, ipsilateral to the reported pain, and advanced to the target area.  Bone was contacted and the needle walked caudad, until the lamina was cleared. The epidural space was identified using ?loss-of-resistance technique? with 2-3 ml of PF-NaCl (0.9% NSS), in a 5cc LOR glass syringe.

## 2022-02-11 ENCOUNTER — Telehealth: Payer: Self-pay

## 2022-02-11 NOTE — Telephone Encounter (Signed)
Post procedure phone call.  LM 

## 2022-02-12 ENCOUNTER — Encounter: Payer: Self-pay | Admitting: Internal Medicine

## 2022-02-12 ENCOUNTER — Inpatient Hospital Stay
Admission: EM | Admit: 2022-02-12 | Discharge: 2022-02-14 | DRG: 190 | Disposition: A | Discharge status: Home / Self Care | Attending: Internal Medicine | Admitting: Internal Medicine

## 2022-02-12 ENCOUNTER — Emergency Department

## 2022-02-12 ENCOUNTER — Other Ambulatory Visit: Payer: Self-pay

## 2022-02-12 ENCOUNTER — Inpatient Hospital Stay

## 2022-02-12 DIAGNOSIS — J441 Chronic obstructive pulmonary disease with (acute) exacerbation: Secondary | ICD-10-CM | POA: Diagnosis present

## 2022-02-12 DIAGNOSIS — Z880 Allergy status to penicillin: Secondary | ICD-10-CM

## 2022-02-12 DIAGNOSIS — J9621 Acute and chronic respiratory failure with hypoxia: Secondary | ICD-10-CM | POA: Diagnosis present

## 2022-02-12 DIAGNOSIS — F411 Generalized anxiety disorder: Secondary | ICD-10-CM | POA: Diagnosis present

## 2022-02-12 DIAGNOSIS — E871 Hypo-osmolality and hyponatremia: Secondary | ICD-10-CM | POA: Diagnosis present

## 2022-02-12 DIAGNOSIS — Z905 Acquired absence of kidney: Secondary | ICD-10-CM | POA: Diagnosis not present

## 2022-02-12 DIAGNOSIS — J9622 Acute and chronic respiratory failure with hypercapnia: Secondary | ICD-10-CM | POA: Diagnosis present

## 2022-02-12 DIAGNOSIS — Z9981 Dependence on supplemental oxygen: Secondary | ICD-10-CM | POA: Diagnosis not present

## 2022-02-12 DIAGNOSIS — T380X5A Adverse effect of glucocorticoids and synthetic analogues, initial encounter: Secondary | ICD-10-CM | POA: Diagnosis present

## 2022-02-12 DIAGNOSIS — Z87892 Personal history of anaphylaxis: Secondary | ICD-10-CM | POA: Diagnosis not present

## 2022-02-12 DIAGNOSIS — Z923 Personal history of irradiation: Secondary | ICD-10-CM | POA: Diagnosis not present

## 2022-02-12 DIAGNOSIS — R45851 Suicidal ideations: Secondary | ICD-10-CM | POA: Diagnosis present

## 2022-02-12 DIAGNOSIS — F17210 Nicotine dependence, cigarettes, uncomplicated: Secondary | ICD-10-CM | POA: Diagnosis present

## 2022-02-12 DIAGNOSIS — F339 Major depressive disorder, recurrent, unspecified: Secondary | ICD-10-CM | POA: Diagnosis not present

## 2022-02-12 DIAGNOSIS — D72829 Elevated white blood cell count, unspecified: Secondary | ICD-10-CM | POA: Diagnosis present

## 2022-02-12 DIAGNOSIS — Z888 Allergy status to other drugs, medicaments and biological substances status: Secondary | ICD-10-CM

## 2022-02-12 DIAGNOSIS — Z885 Allergy status to narcotic agent status: Secondary | ICD-10-CM | POA: Diagnosis not present

## 2022-02-12 DIAGNOSIS — G8929 Other chronic pain: Secondary | ICD-10-CM | POA: Diagnosis present

## 2022-02-12 DIAGNOSIS — Z9851 Tubal ligation status: Secondary | ICD-10-CM | POA: Diagnosis not present

## 2022-02-12 DIAGNOSIS — Z20822 Contact with and (suspected) exposure to covid-19: Secondary | ICD-10-CM | POA: Diagnosis present

## 2022-02-12 DIAGNOSIS — Z882 Allergy status to sulfonamides status: Secondary | ICD-10-CM | POA: Diagnosis not present

## 2022-02-12 DIAGNOSIS — F17200 Nicotine dependence, unspecified, uncomplicated: Secondary | ICD-10-CM | POA: Diagnosis present

## 2022-02-12 DIAGNOSIS — F32A Depression, unspecified: Secondary | ICD-10-CM | POA: Diagnosis present

## 2022-02-12 DIAGNOSIS — Z9012 Acquired absence of left breast and nipple: Secondary | ICD-10-CM

## 2022-02-12 DIAGNOSIS — Z853 Personal history of malignant neoplasm of breast: Secondary | ICD-10-CM | POA: Diagnosis not present

## 2022-02-12 DIAGNOSIS — M545 Low back pain, unspecified: Secondary | ICD-10-CM | POA: Diagnosis present

## 2022-02-12 DIAGNOSIS — Z803 Family history of malignant neoplasm of breast: Secondary | ICD-10-CM

## 2022-02-12 HISTORY — DX: Post-traumatic stress disorder, unspecified: F43.10

## 2022-02-12 HISTORY — DX: Other specified postprocedural states: Z98.890

## 2022-02-12 LAB — BLOOD GAS, VENOUS
Acid-Base Excess: 8.7 mmol/L — ABNORMAL HIGH (ref 0.0–2.0)
Bicarbonate: 36.7 mmol/L — ABNORMAL HIGH (ref 20.0–28.0)
O2 Saturation: 48.5 %
Patient temperature: 37
pCO2, Ven: 65 mmHg — ABNORMAL HIGH (ref 44–60)
pH, Ven: 7.36 (ref 7.25–7.43)
pO2, Ven: 32 mmHg (ref 32–45)

## 2022-02-12 LAB — COMPREHENSIVE METABOLIC PANEL
ALT: 14 U/L (ref 0–44)
AST: 16 U/L (ref 15–41)
Albumin: 4 g/dL (ref 3.5–5.0)
Alkaline Phosphatase: 41 U/L (ref 38–126)
Anion gap: 8 (ref 5–15)
BUN: 10 mg/dL (ref 8–23)
CO2: 32 mmol/L (ref 22–32)
Calcium: 9.3 mg/dL (ref 8.9–10.3)
Chloride: 95 mmol/L — ABNORMAL LOW (ref 98–111)
Creatinine, Ser: 0.52 mg/dL (ref 0.44–1.00)
GFR, Estimated: >60 mL/min (ref >60)
Glucose, Bld: 108 mg/dL — ABNORMAL HIGH (ref 70–99)
Potassium: 5.1 mmol/L (ref 3.5–5.1)
Sodium: 135 mmol/L (ref 135–145)
Total Bilirubin: 0.3 mg/dL (ref 0.3–1.2)
Total Protein: 7 g/dL (ref 6.5–8.1)

## 2022-02-12 LAB — CBC
HCT: 41.2 % (ref 36.0–46.0)
Hemoglobin: 13.2 g/dL (ref 12.0–15.0)
MCH: 29.2 pg (ref 26.0–34.0)
MCHC: 32 g/dL (ref 30.0–36.0)
MCV: 91.2 fL (ref 80.0–100.0)
Platelets: 290 10*3/uL (ref 150–400)
RBC: 4.52 MIL/uL (ref 3.87–5.11)
RDW: 14.1 % (ref 11.5–15.5)
WBC: 7.6 10*3/uL (ref 4.0–10.5)
nRBC: 0 % (ref 0.0–0.2)

## 2022-02-12 LAB — RESP PANEL BY RT-PCR (FLU A&B, COVID) ARPGX2
Influenza A by PCR: NEGATIVE
Influenza B by PCR: NEGATIVE
SARS Coronavirus 2 by RT PCR: NEGATIVE

## 2022-02-12 LAB — TROPONIN I (HIGH SENSITIVITY)
Troponin I (High Sensitivity): 7 ng/L (ref <18)
Troponin I (High Sensitivity): 5 ng/L (ref <18)

## 2022-02-12 LAB — BRAIN NATRIURETIC PEPTIDE: B Natriuretic Peptide: 14.5 pg/mL (ref 0.0–100.0)

## 2022-02-12 IMAGING — DX DG CHEST 1V PORT
1 series · 1 of 1 positions shown · non-contrast
Comparison: Chest x-ray [DATE].

CLINICAL DATA: 60-year-old female with history of shortness of
breath.

EXAM:
PORTABLE CHEST 1 VIEW

[chest ap]
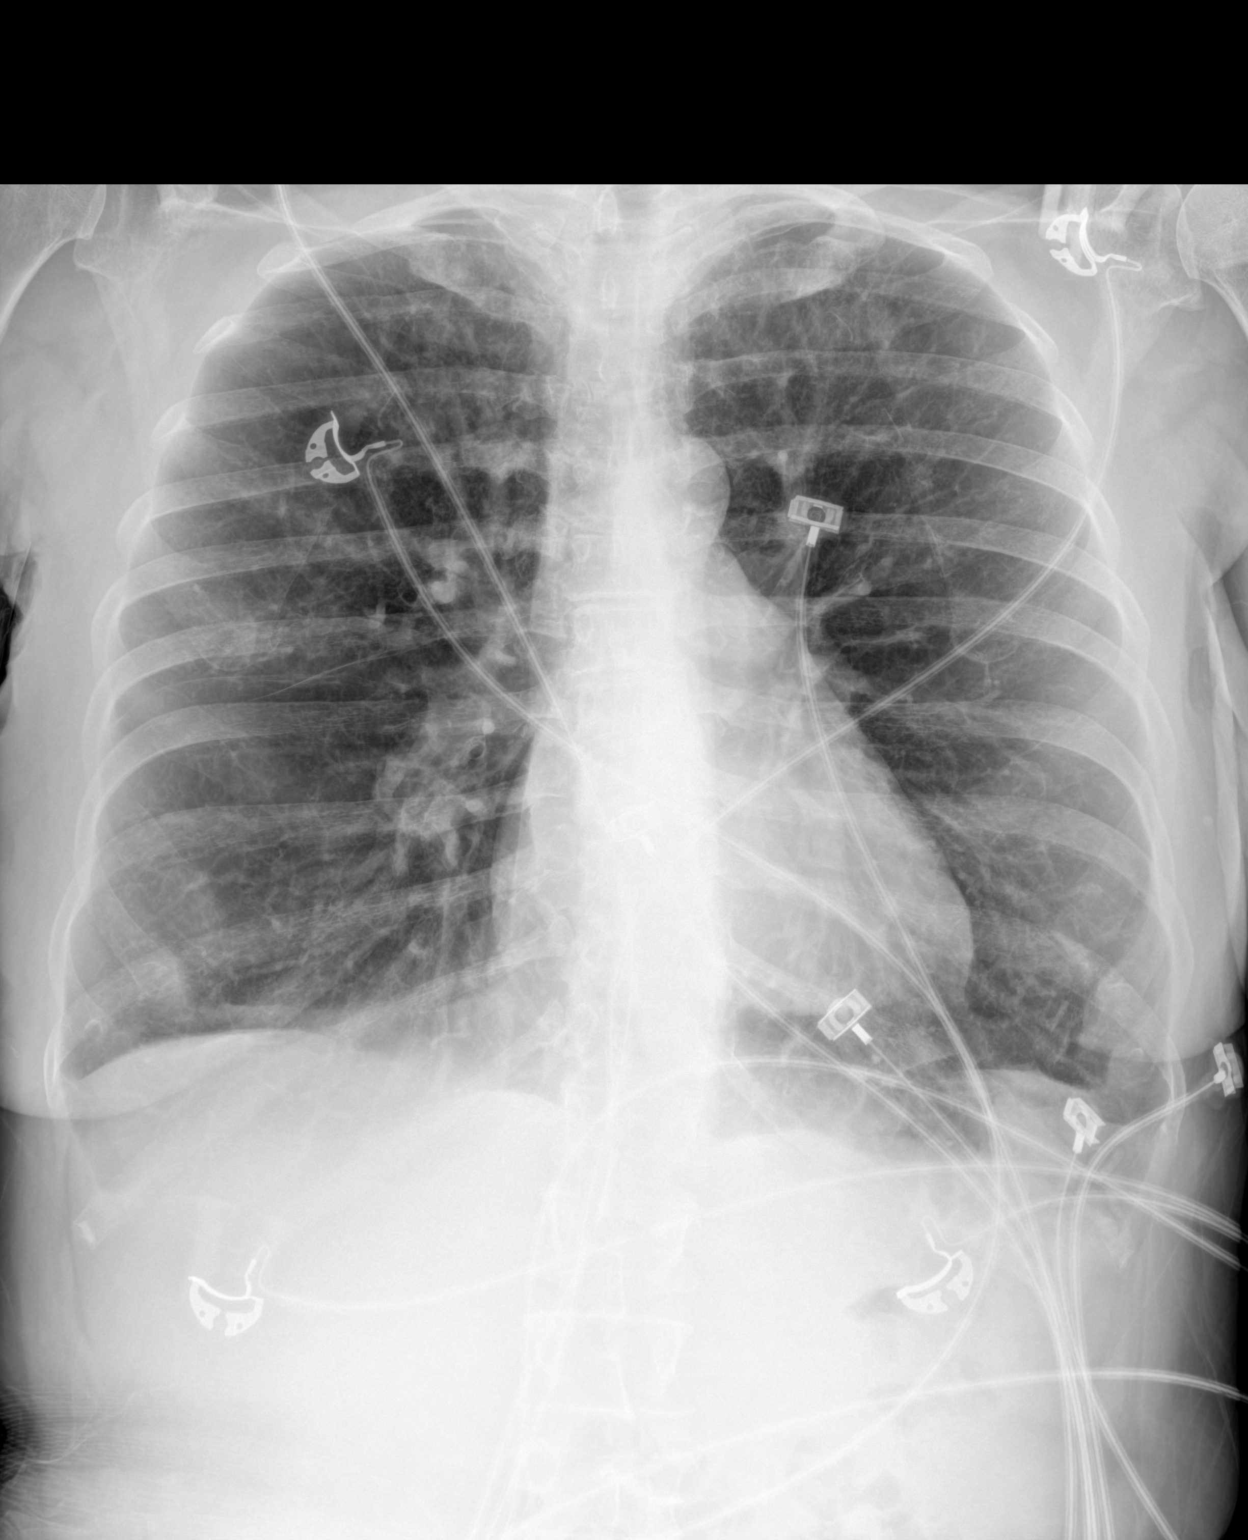

[1 of 1 positions shown; findings below may reference images not displayed]

FINDINGS: Lung volumes are increased with emphysematous changes. No
consolidative airspace disease. No pleural effusions. No
pneumothorax. Ill-defined nodular density projecting over the lower
right upper lobe. Pulmonary vasculature and the cardiomediastinal
silhouette are within normal limits.
IMPRESSION: 1. Ill-defined nodule in the inferior aspect of the right upper
lobe. This could be of infectious or inflammatory etiology, however,
underlying neoplasm is not excluded. Followup PA and lateral chest
X-ray is recommended in 3-4 weeks following trial of antibiotic
therapy to ensure resolution and exclude underlying malignancy.
2. Emphysema.

## 2022-02-12 IMAGING — CT CT CHEST W/O CM
2 of 4 series · 15 of 36 positions shown, 18 images · non-contrast
Comparison: CT done on [DATE], chest radiograph done on
[DATE]

CLINICAL DATA: Lung nodule shortness of breath



[Series 2: chest wo · axial · 0.66mm/px · z∈[+1204,+1446]mm · 12 of 145 slices shown, 15 images]
[im 12/145  mediastinal]
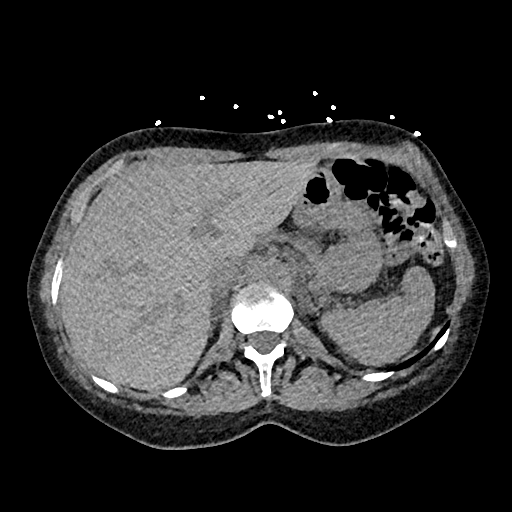
[im 12/145  lung]
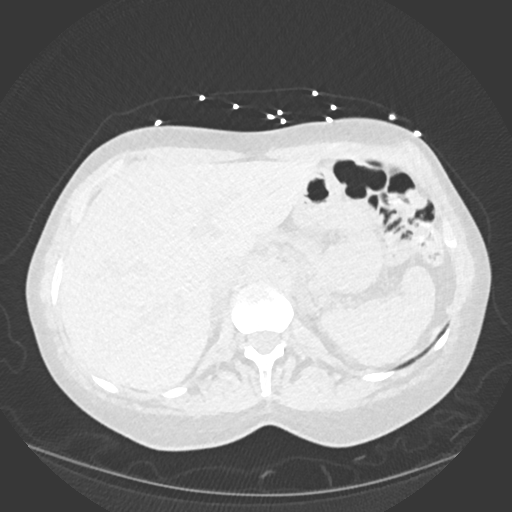
[im 23/145  lung]
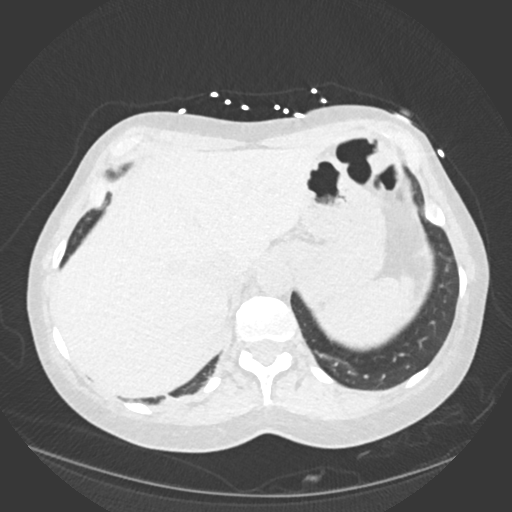
[im 34/145  lung]
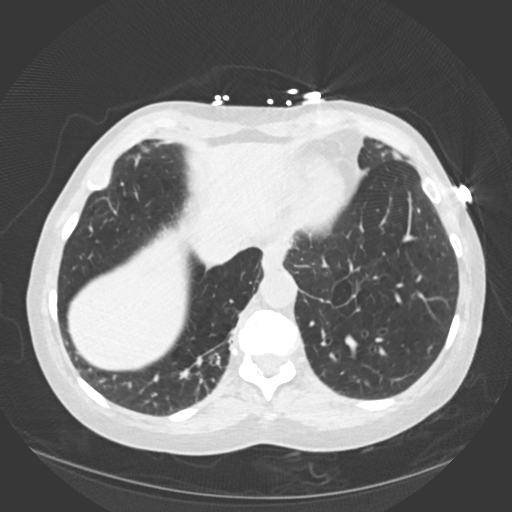
[im 45/145  lung]
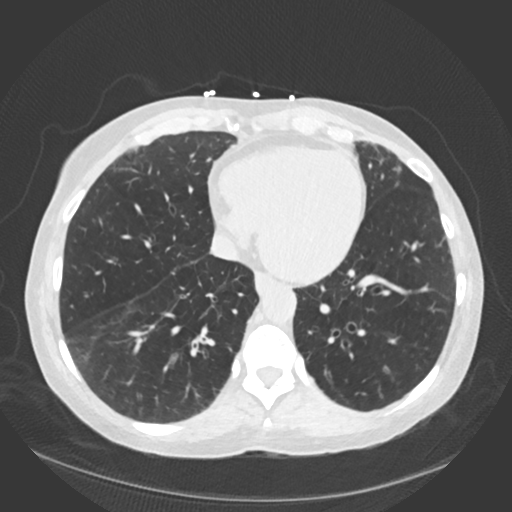
[im 56/145  mediastinal]
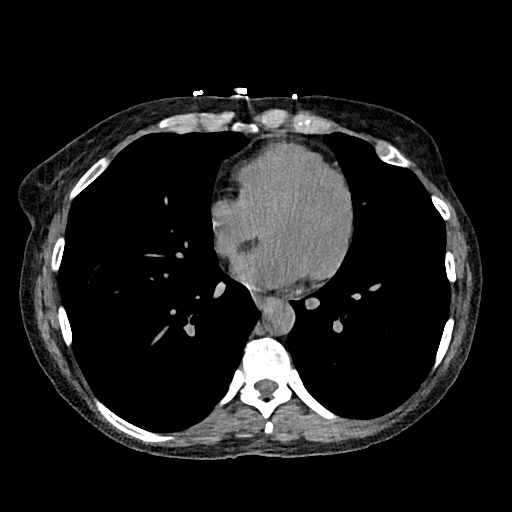
[im 56/145  lung]
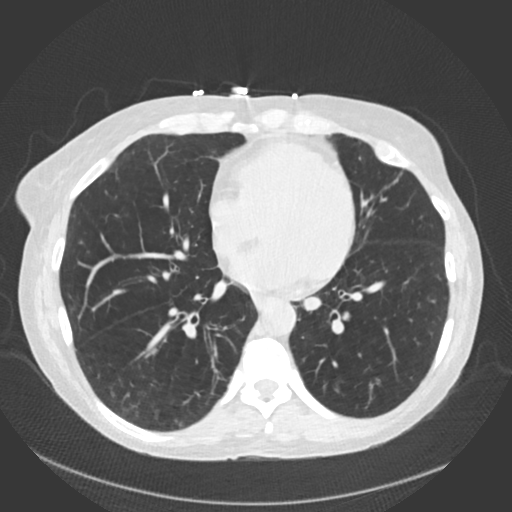
[im 67/145  lung]
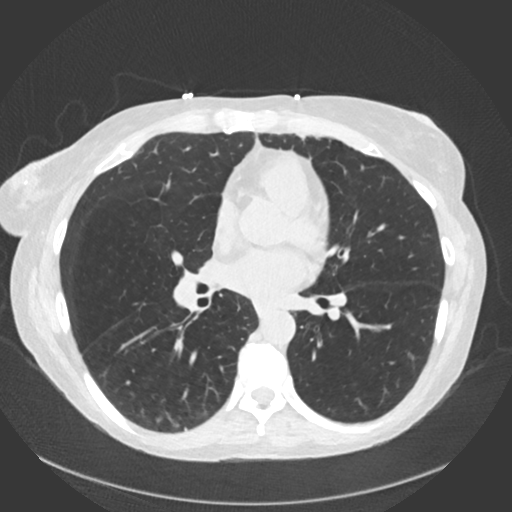
[im 78/145  lung]
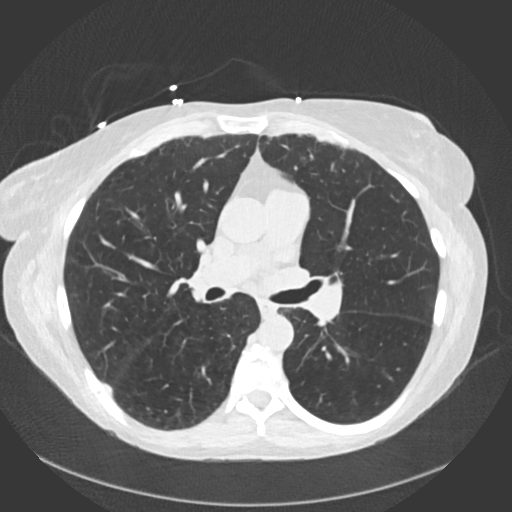
[im 89/145  lung]
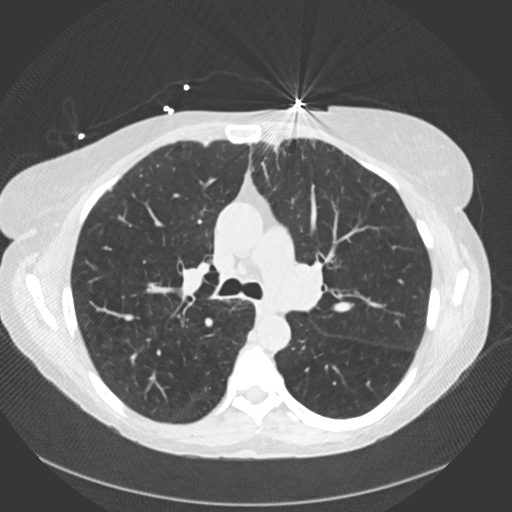
[im 100/145  mediastinal]
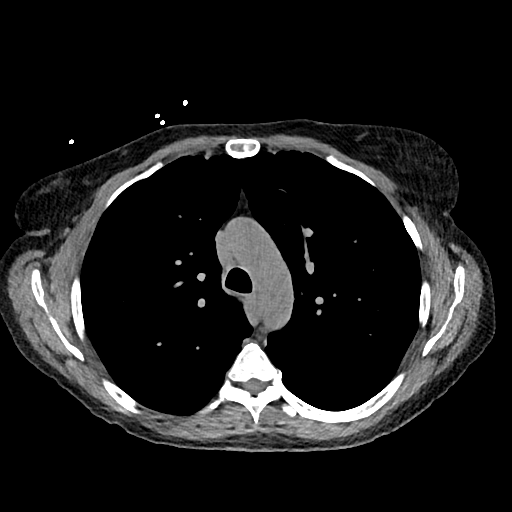
[im 100/145  lung]
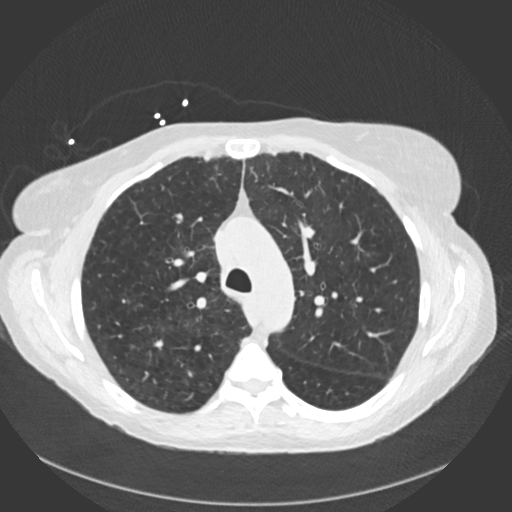
[im 111/145  lung]
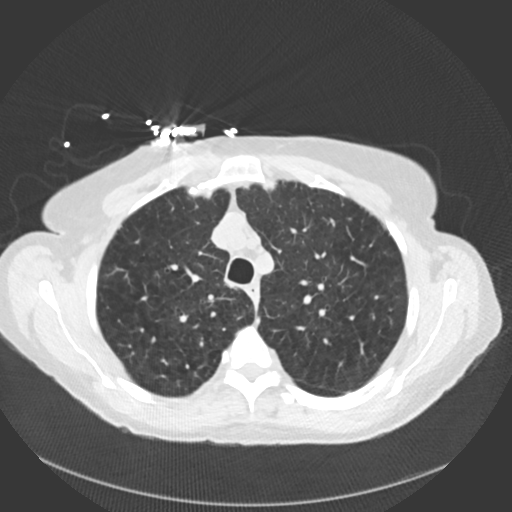
[im 122/145  lung]
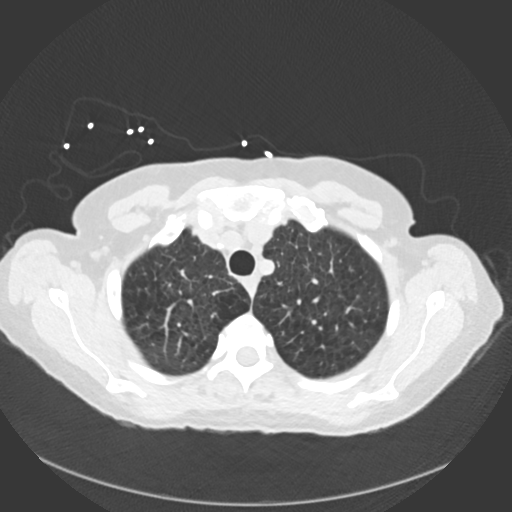
[im 133/145  lung]
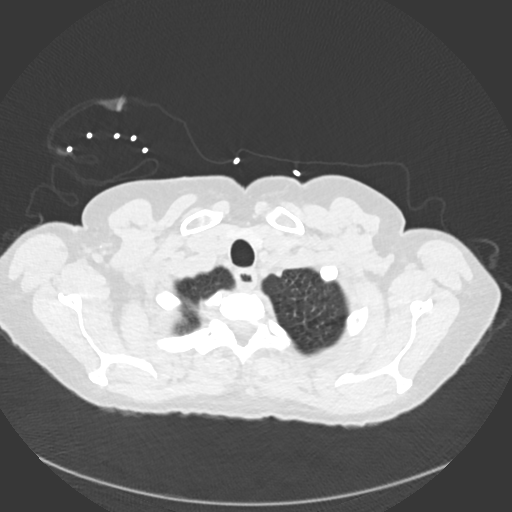

[Series 5: cor · coronal · 0.56mm/px · 3 of 123 slices shown]
[im 25/123  lung]
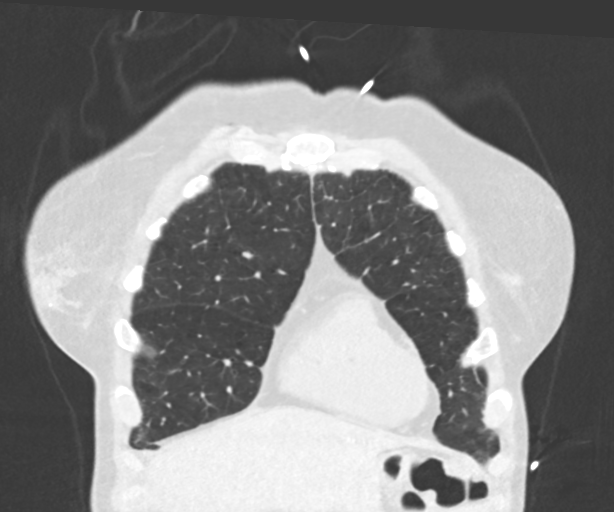
[im 49/123  lung]
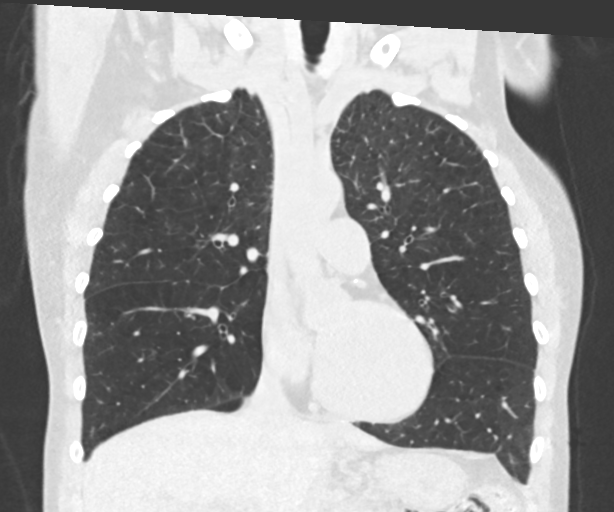
[im 74/123  lung]
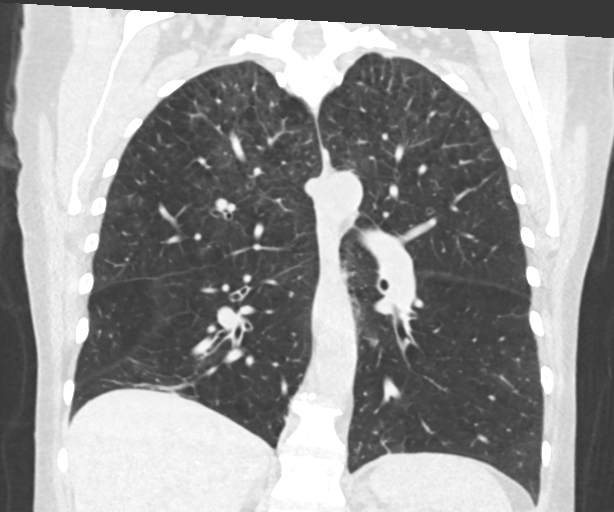

[15 of 36 positions shown; findings below may reference images not displayed]

FINDINGS: Cardiovascular: Scattered coronary artery calcifications are seen.

Mediastinum/Nodes: There are subcentimeter nodes in the mediastinum.

Lungs/Pleura: Centrilobular emphysema is seen. There is no focal
pulmonary consolidation. There are no discrete lung nodules. There
is pleural thickening in the posterolateral aspect of right mid lung
fields adjacent to the posterolateral aspect of right seventh rib.
There is break in the cortical margins in the posterolateral aspect
of right seventh rib suggesting a healing fracture. This finding
corresponds to the area of ill-defined nodule seen in the chest
radiograph. Small linear densities seen in the lower lung fields
suggesting scarring or subsegmental atelectasis.

Upper Abdomen: Unremarkable.

Musculoskeletal: Healing fracture is seen in the posterolateral
aspect of right seventh rib. Old healed fractures are seen in the
posterior aspect of right fifth and sixth ribs close to the
costovertebral junction.
IMPRESSION: COPD.  There are no focal infiltrates or discrete lung nodules.

A healing fracture with adjacent pleural thickening is seen in the
posterolateral aspect of right seventh rib which corresponds to the
area of ill-defined nodule seen in the chest radiograph.

Other findings as described in the body of the report.

## 2022-02-12 MED ORDER — CYCLOBENZAPRINE HCL 10 MG PO TABS
10.0000 mg | ORAL_TABLET | Freq: Three times a day (TID) | ORAL | Status: DC | PRN
  Administered 2022-02-12 – 2022-02-14 (×5): 10 mg via ORAL
  Filled 2022-02-12 (×5): qty 1

## 2022-02-12 MED ORDER — LEVOFLOXACIN IN D5W 750 MG/150ML IV SOLN
750.0000 mg | Freq: Once | INTRAVENOUS | Status: AC
  Administered 2022-02-12: 750 mg via INTRAVENOUS
  Filled 2022-02-12: qty 150

## 2022-02-12 MED ORDER — GUAIFENESIN ER 600 MG PO TB12
1200.0000 mg | ORAL_TABLET | Freq: Two times a day (BID) | ORAL | Status: DC
  Administered 2022-02-12 – 2022-02-14 (×5): 1200 mg via ORAL
  Filled 2022-02-12 (×5): qty 2

## 2022-02-12 MED ORDER — ACETAMINOPHEN 650 MG RE SUPP: 650.0000 mg | Freq: Four times a day (QID) | RECTAL | Status: DC | PRN

## 2022-02-12 MED ORDER — DULOXETINE HCL 20 MG PO CPEP
20.0000 mg | ORAL_CAPSULE | Freq: Every day | ORAL | Status: DC
  Administered 2022-02-13: 20 mg via ORAL
  Filled 2022-02-12: qty 1

## 2022-02-12 MED ORDER — MIRTAZAPINE 15 MG PO TABS
15.0000 mg | ORAL_TABLET | Freq: Every day | ORAL | Status: DC
  Administered 2022-02-13: 15 mg via ORAL
  Filled 2022-02-12: qty 1

## 2022-02-12 MED ORDER — IPRATROPIUM BROMIDE 0.02 % IN SOLN
0.2500 mg | Freq: Four times a day (QID) | RESPIRATORY_TRACT | Status: DC
  Administered 2022-02-12 – 2022-02-13 (×4): 0.25 mg via RESPIRATORY_TRACT
  Filled 2022-02-12 (×4): qty 2.5

## 2022-02-12 MED ORDER — EPINEPHRINE 0.3 MG/0.3ML IJ SOAJ
0.3000 mg | INTRAMUSCULAR | Status: DC | PRN
  Filled 2022-02-12: qty 0.3

## 2022-02-12 MED ORDER — ACETAMINOPHEN 325 MG PO TABS
650.0000 mg | ORAL_TABLET | Freq: Four times a day (QID) | ORAL | Status: DC | PRN
  Administered 2022-02-12 – 2022-02-14 (×5): 650 mg via ORAL
  Filled 2022-02-12 (×5): qty 2

## 2022-02-12 MED ORDER — ONDANSETRON HCL 4 MG PO TABS: 4.0000 mg | ORAL_TABLET | Freq: Four times a day (QID) | ORAL | Status: DC | PRN

## 2022-02-12 MED ORDER — FLUTICASONE FUROATE-VILANTEROL 100-25 MCG/ACT IN AEPB
1.0000 | INHALATION_SPRAY | Freq: Every day | RESPIRATORY_TRACT | Status: DC
Start: 2022-02-13 — End: 2022-02-14
  Administered 2022-02-13 – 2022-02-14 (×2): 1 via RESPIRATORY_TRACT
  Filled 2022-02-12: qty 28

## 2022-02-12 MED ORDER — IPRATROPIUM-ALBUTEROL 0.5-2.5 (3) MG/3ML IN SOLN
RESPIRATORY_TRACT | Status: AC
  Administered 2022-02-12: 3 mL via RESPIRATORY_TRACT
  Filled 2022-02-12: qty 6

## 2022-02-12 MED ORDER — CITALOPRAM HYDROBROMIDE 20 MG PO TABS
10.0000 mg | ORAL_TABLET | Freq: Every day | ORAL | Status: DC
  Administered 2022-02-12 – 2022-02-14 (×3): 10 mg via ORAL
  Filled 2022-02-12 (×3): qty 1

## 2022-02-12 MED ORDER — QUETIAPINE FUMARATE 25 MG PO TABS
200.0000 mg | ORAL_TABLET | Freq: Every day | ORAL | Status: DC
  Administered 2022-02-13: 200 mg via ORAL
  Filled 2022-02-12: qty 8

## 2022-02-12 MED ORDER — PREDNISONE 20 MG PO TABS
40.0000 mg | ORAL_TABLET | Freq: Every day | ORAL | Status: DC
Start: 2022-02-13 — End: 2022-02-14
  Administered 2022-02-13 – 2022-02-14 (×2): 40 mg via ORAL
  Filled 2022-02-12 (×2): qty 2

## 2022-02-12 MED ORDER — DIAZEPAM 2 MG PO TABS
2.0000 mg | ORAL_TABLET | Freq: Every day | ORAL | Status: DC | PRN
  Administered 2022-02-12 – 2022-02-13 (×2): 2 mg via ORAL
  Filled 2022-02-12 (×2): qty 1

## 2022-02-12 MED ORDER — UMECLIDINIUM BROMIDE 62.5 MCG/ACT IN AEPB
1.0000 | INHALATION_SPRAY | Freq: Every day | RESPIRATORY_TRACT | Status: DC
Start: 2022-02-13 — End: 2022-02-14
  Administered 2022-02-13 – 2022-02-14 (×2): 1 via RESPIRATORY_TRACT
  Filled 2022-02-12: qty 7

## 2022-02-12 MED ORDER — SODIUM CHLORIDE 0.9 % IV SOLN: INTRAVENOUS | Status: AC

## 2022-02-12 MED ORDER — METHYLPREDNISOLONE SODIUM SUCC 40 MG IJ SOLR
40.0000 mg | Freq: Two times a day (BID) | INTRAMUSCULAR | Status: AC
  Administered 2022-02-12 (×2): 40 mg via INTRAVENOUS
  Filled 2022-02-12 (×2): qty 1

## 2022-02-12 MED ORDER — IPRATROPIUM-ALBUTEROL 0.5-2.5 (3) MG/3ML IN SOLN: 3.0000 mL | Freq: Once | RESPIRATORY_TRACT | Status: AC

## 2022-02-12 MED ORDER — IPRATROPIUM-ALBUTEROL 0.5-2.5 (3) MG/3ML IN SOLN
3.0000 mL | Freq: Once | RESPIRATORY_TRACT | Status: AC
Start: 2022-02-12 — End: 2022-02-12
  Administered 2022-02-12: 3 mL via RESPIRATORY_TRACT

## 2022-02-12 MED ORDER — ONDANSETRON HCL 4 MG/2ML IJ SOLN
4.0000 mg | Freq: Once | INTRAMUSCULAR | Status: AC
Start: 2022-02-12 — End: 2022-02-12
  Administered 2022-02-12: 4 mg via INTRAVENOUS
  Filled 2022-02-12: qty 2

## 2022-02-12 MED ORDER — FLUTICASONE PROPIONATE 50 MCG/ACT NA SUSP
2.0000 | Freq: Every day | NASAL | Status: DC | PRN
  Administered 2022-02-14: 2 via NASAL
  Filled 2022-02-12: qty 16

## 2022-02-12 MED ORDER — FENTANYL CITRATE PF 50 MCG/ML IJ SOSY
50.0000 ug | PREFILLED_SYRINGE | Freq: Once | INTRAMUSCULAR | Status: AC
  Administered 2022-02-12: 50 ug via INTRAVENOUS
  Filled 2022-02-12: qty 1

## 2022-02-12 MED ORDER — ENOXAPARIN SODIUM 40 MG/0.4ML IJ SOSY
40.0000 mg | PREFILLED_SYRINGE | INTRAMUSCULAR | Status: DC
  Administered 2022-02-12 – 2022-02-14 (×3): 40 mg via SUBCUTANEOUS
  Filled 2022-02-12 (×3): qty 0.4

## 2022-02-12 MED ORDER — ONDANSETRON HCL 4 MG/2ML IJ SOLN: 4.0000 mg | Freq: Four times a day (QID) | INTRAMUSCULAR | Status: DC | PRN

## 2022-02-12 NOTE — Assessment & Plan Note (Addendum)
Patient has a history of depression and states that she has episodes of suicidal ideation but denies having a plan. ?She denies having any homicidal ideations ?Psychiatry evaluated her and she does not need inpatient psych care and they are recommending following up with her on outpatient psychiatrist, patient has an appointment for next week. ?-Continue home meds ?

## 2022-02-12 NOTE — H&P (Addendum)
?History and Physical  ? ? ?Patient: Colleen [name] DOB: [birth year] ?DOA: 02/12/2022 ?DOS: the patient was seen and examined on 02/12/2022 ?PCP: Remi Haggard, FNP  ?Patient coming from: Home ? ?Chief Complaint:  ?Chief Complaint  ?Patient presents with  ? Shortness of Breath  ? ?HPI: Colleen [name] is a 61 y.o. female with medical history significant for COPD with chronic respiratory failure on 2 L of oxygen at night, history of breast cancer status post radiation therapy, nicotine dependence, depression, anxiety who presents to the ER via EMS for evaluation of worsening shortness of breath. ?Patient states that she has been in her usual state of health until the last couple of days when she developed increased congestion which she thought was related to allergies.  On the morning of admission she developed worsening shortness of breath and when EMS arrived she was found to be in respiratory distress with use of accessory muscles.  She was placed on CPAP and transported to the ER.  They also administered 125 mg of IV Solu-Medrol. ?She has been weaned off CPAP and is currently on 4 L of oxygen maintaining pulse oximetry greater than 92% ?She has a cough productive of clear phlegm and denies having any fever or chills.  She denies having any sick contacts.  She denies having any chest pain, no nausea, no vomiting, no diaphoresis, no palpitations, no leg swelling, no abdominal pain, no changes in her bowel habits, no urinary symptoms, no blurred vision or focal deficit. ?She admits to being depressed and has occasional suicidal ideations but denies having any homicidal ideation and does not have a plan. ? ?Review of Systems: As mentioned in the history of present illness. All other systems reviewed and are negative. ?Past Medical History:  ?Diagnosis Date  ? Anxiety   ? Asthma   ? Breast cancer ([place]) 2004  ? left breast  ? Chronic back pain   ? COPD (chronic obstructive pulmonary disease) ([place])   ?  Depression   ? Personal history of radiation therapy   ? ?Past Surgical History:  ?Procedure Laterality Date  ? BREAST BIOPSY Left 2004  ? positive  ? BREAST BIOPSY Right   ? neg  ? BREAST LUMPECTOMY Left 2004  ? positive  ? LEFT HEART CATH AND CORONARY ANGIOGRAPHY N/A 11/06/2021  ? Procedure: LEFT HEART CATH AND CORONARY ANGIOGRAPHY;  Surgeon: Corey Skains, MD;  Location: [facility];  Service: Cardiovascular;  Laterality: N/A;  ? MASTECTOMY Left   ? MASTECTOMY    ? NEPHRECTOMY Left   ? TUBAL LIGATION    ? ?Social History:  reports that she has been smoking cigarettes. She has been smoking an average of .5 packs per day. She has never been exposed to tobacco smoke. She has never used smokeless tobacco. She reports that she does not drink alcohol and does not use drugs. ? ?Allergies  ?Allergen Reactions  ? Desvenlafaxine Anaphylaxis  ? Morphine And Related Anaphylaxis  ? Penicillins Anaphylaxis  ?  Has patient had a PCN reaction causing immediate rash, facial/tongue/throat swelling, SOB or lightheadedness with hypotension: Yes ?Has patient had a PCN reaction causing severe rash involving mucus membranes or skin necrosis: No ?Has patient had a PCN reaction that required hospitalization: Unknown ?Has patient had a PCN reaction occurring within the last 10 years: No ?If all of the above answers are "NO", then may proceed with Cephalosporin use. ?  ? Prazosin Other (See Comments)  ? Tamoxifen Anaphylaxis  ?  Trazodone Anaphylaxis  ? Clonazepam   ? Codeine Swelling  ? Duloxetine Itching  ? Gabapentin Hives  ?  Rash and swelling.   ? Hydroxyzine Itching  ? Lorazepam   ?  Patient feels this medications makes her to sedated and wants to avoid  ? Paroxetine Hcl Hives  ? Sulfa Antibiotics Itching  ? Cephalexin Rash  ? ? ?Family History  ?Problem Relation Age of Onset  ? Breast cancer Cousin   ? Breast cancer Cousin   ? Bipolar disorder Daughter   ? Drug abuse Daughter   ? Alcohol abuse Maternal Grandfather    ? ? ?Prior to Admission medications   ?Medication Sig Start Date End Date Taking? Authorizing Provider  ?albuterol (VENTOLIN HFA) 108 (90 Base) MCG/ACT inhaler Inhale 1-2 puffs into the lungs every 4 (four) hours as needed for cough or wheezing.   Yes [provider]  ?cholecalciferol (VITAMIN D3) 25 MCG (1000 UNIT) tablet Take 1,000 Units by mouth daily.   Yes [provider]  ?citalopram (CELEXA) 10 MG tablet Take 1 tablet (10 mg total) by mouth daily. 12/19/21  Yes [name], DO  ?cyclobenzaprine (FLEXERIL) 10 MG tablet Take 1 tablet (10 mg total) by mouth 3 (three) times daily as needed for muscle spasms. 01/15/22  Yes Gillis Santa, MD  ?denosumab (PROLIA) 60 MG/ML SOSY injection Inject 60 mg into the skin every 6 (six) months.   Yes [provider]  ?diazepam (VALIUM) 2 MG tablet Take 1 tablet (2 mg total) by mouth daily as needed for anxiety. For severe panic symptoms 01/29/22  Yes Eappen, Ria Clock, MD  ?fluticasone (FLONASE) 50 MCG/ACT nasal spray Place 2 sprays into both nostrils daily. 10/23/21  Yes Pokhrel, Laxman, MD  ?Fluticasone-Umeclidin-Vilant (TRELEGY ELLIPTA) 100-62.5-25 MCG/INH AEPB Inhale 1 puff into the lungs daily.   Yes [provider]  ?ipratropium (ATROVENT) 0.02 % nebulizer solution Take 0.25 mg by nebulization 4 (four) times daily.   Yes [provider]  ?mirtazapine (REMERON) 15 MG tablet Take 1 tablet (15 mg total) by mouth at bedtime. 12/18/21  Yes [name], DO  ?QUEtiapine (SEROQUEL) 200 MG tablet Take 1 tablet (200 mg total) by mouth at bedtime. 12/18/21  Yes [name], DO  ?vitamin B-12 (CYANOCOBALAMIN) 500 MCG tablet Take 500 mcg by mouth daily.   Yes [provider]  ?EPINEPHrine 0.3 mg/0.3 mL IJ SOAJ injection Inject 0.3 mg into the muscle as needed for anaphylaxis.    [provider]  ? ? ?Physical Exam: ?Vitals:  ? 02/12/22 0930 02/12/22 1000 02/12/22 1030 02/12/22 1100  ?BP: 122/78  112/78 124/84 124/84  ?Pulse: 94 89 (!) 104 92  ?Resp: (!) 41 15 (!) 30 (!) 24  ?Temp:      ?TempSrc:      ?SpO2: 96% 94% 95% 97%  ?Weight:      ?Height:      ? ?Physical Exam ?Constitutional:   ?   Appearance: She is well-developed.  ?HENT:  ?   Head: Normocephalic and atraumatic.  ?   Mouth/Throat:  ?   Mouth: Mucous membranes are moist.  ?Eyes:  ?   Pupils: Pupils are equal, round, and reactive to light.  ?Pulmonary:  ?   Effort: Tachypnea present.  ?   Breath sounds: Examination of the right-upper field reveals wheezing. Examination of the left-upper field reveals wheezing. Examination of the right-middle field reveals wheezing. Examination of the left-middle field reveals wheezing. Examination of the right-lower field reveals  wheezing. Examination of the left-lower field reveals wheezing. Wheezing present.  ?Abdominal:  ?   General: Bowel sounds are normal.  ?   Palpations: Abdomen is soft.  ?Musculoskeletal:     ?   General: Normal range of motion.  ?   Cervical back: Normal range of motion and neck supple.  ?Skin: ?   General: Skin is warm and dry.  ?Neurological:  ?   General: No focal deficit present.  ?   Mental Status: She is alert and oriented to person, place, and time.  ?Psychiatric:     ?   Mood and Affect: Mood normal.     ?   Behavior: Behavior normal.  ? ? ?Data Reviewed: ?Relevant notes from primary care and specialist visits, past discharge summaries as available in EHR, including Care Everywhere. ?Prior diagnostic testing as pertinent to current admission diagnoses ?Updated medications and problem lists for reconciliation ?ED course, including vitals, labs, imaging, treatment and response to treatment ?Triage notes, nursing and pharmacy notes and ED provider's notes ?Notable results as noted in HPI ?Labs reviewed.  VBG pH 7.36/65/32/36.7/48.5, troponin negative, white count 7.6, hemoglobin 13.2, hematocrit 41.2, MCV 91.2, RDW 14.1, ?Respiratory viral panel is negative ?Chest x-ray reviewed by me  shows Ill-defined nodule in the inferior aspect of the right upper ?lobe. This could be of infectious or inflammatory etiology, however,underlying neoplasm is not excluded. Followup PA and lateral chest X-ray i

## 2022-02-12 NOTE — ED Notes (Signed)
RN to bedside to answer call bell again. Pt is very restless and cannot sit still. Complaining of back pain and can't get comfortable. Pt also has HX of anxiety. MD made aware ?

## 2022-02-12 NOTE — Assessment & Plan Note (Addendum)
Patient presents for evaluation of worsening shortness of breath from her baseline associated with cough productive of clear phlegm and wheezing. ?Clinically seems improving.  Currently on 2 L of oxygen which is her baseline but she was mostly using oxygen at night.  Little worsening of leukocytosis most likely secondary to Solu-Medrol. ?-Continue with prednisone ?-Start her on Z-Pak ?-Continue with bronchodilators ?-Continue oxygen supplementation to maintain pulse oximetry greater than 94% ?-Continue with supportive care ? ?

## 2022-02-12 NOTE — Progress Notes (Signed)
Admission profile updated. ?

## 2022-02-12 NOTE — ED Notes (Signed)
Pt advised she does not feel safe at home. Physically safe at home but not mentally safe at home. When asked does she want to harm herself, pt says "yes sometimes because I stay so sick and cannot care for myself".  ?

## 2022-02-12 NOTE — ED Notes (Signed)
This RN assisted pt to toilet and back to bed.  ?

## 2022-02-12 NOTE — ED Provider Notes (Addendum)
? ?[facility] ?Provider Note ? ? ? Event Date/Time  ? First MD Initiated Contact with Patient 02/12/22 [phone]   ?  (approximate) ? ?History  ? ?Chief Complaint: Shortness of Breath ? ?HPI ? ?Colleen [name] is a 61 y.o. female with a past medical history of anxiety, COPD, presents to the emergency department for shortness of breath.  Patient wears 2 L of oxygen at night only.  Patient states over the past 24 hours she has had worsening shortness of breath.  Came in via EMS on CPAP, received 125 mg of Solu-Medrol and 2 DuoNeb's in route to the hospital.  Patient continues to be short of breath and tachypneic upon arrival sitting upright in bed, transition to BiPAP. ? ?Physical Exam  ? ?Triage Vital Signs: ?ED Triage Vitals  ?Enc Vitals Group  ?   BP 02/12/22 0815 134/86  ?   Pulse Rate 02/12/22 0810 100  ?   Resp 02/12/22 0810 (!) 30  ?   Temp 02/12/22 0810 97.9 ?F (36.6 ?C)  ?   Temp Source 02/12/22 0810 Oral  ?   SpO2 02/12/22 0810 100 %  ?   Weight 02/12/22 0811 120 lb 13 oz (54.8 kg)  ?   Height 02/12/22 0811 5' (1.524 m)  ?   Head Circumference --   ?   Peak Flow --   ?   Pain Score 02/12/22 0811 3  ?   Pain Loc --   ?   Pain Edu? --   ?   Excl. in [place]? --   ? ? ?Most recent vital signs: ?Vitals:  ? 02/12/22 0810 02/12/22 0815  ?BP:  134/86  ?Pulse: 100   ?Resp: (!) 30   ?Temp: 97.9 ?F (36.6 ?C)   ?SpO2: 100%   ? ? ?General: Awake, no distress.  ?CV:  Good peripheral perfusion.  Regular rate and rhythm around 100 bpm ?Resp:  Moderate tachypnea.  Diminished breath sounds bilaterally with some wheeze auscultated. ?Abd:  No distention.  Soft, nontender.  No rebound or guarding. ? ? ?ED Results / Procedures / Treatments  ? ?EKG ? ?EKG viewed and interpreted by myself shows what appears to be a sinus rhythm at 104 bpm with a narrow QRS, normal axis, normal intervals, no concerning ST changes. ? ?RADIOLOGY ? ?I personally viewed the chest x-ray images, no significant abnormality seen on my  evaluation. ?Radiology has read an ill-defined nodule in the right upper lobe infectious versus inflammatory versus neoplasm.  Follow-up x-ray recommended 1 month ? ? ?MEDICATIONS ORDERED IN ED: ?Medications  ?ipratropium-albuterol (DUONEB) 0.5-2.5 (3) MG/3ML nebulizer solution 3 mL (has no administration in time range)  ?ipratropium-albuterol (DUONEB) 0.5-2.5 (3) MG/3ML nebulizer solution 3 mL (has no administration in time range)  ?ipratropium-albuterol (DUONEB) 0.5-2.5 (3) MG/3ML nebulizer solution (has no administration in time range)  ? ? ? ?IMPRESSION / MDM / ASSESSMENT AND PLAN / ED COURSE  ?I reviewed the triage vital signs and the nursing notes. ? ?Patient presents to the emergency department for shortness of breath.  Patient has a history of COPD wears oxygen 2 L as needed.  Patient has tachypnea with diminished breath sounds bilaterally with some expiratory wheeze consistent with likely COPD exacerbation.  We have placed patient on BiPAP immediately upon arrival.  We will dose 2 additional DuoNebs.  We will check labs, chest x-ray, VBG and continue to closely monitor.  Patient agreeable to plan of care. ? ?Patient's work-up is overall nonrevealing.  Chest  x-ray shows lung nodule could be infectious versus inflammatory versus oncologic.  Lab work is reassuring including a normal CBC with a normal white blood cell count.  Overall reassuring VBG slightly increased PCO2.  Troponin negative.  Chemistry is reassuring.  Given the patient's continued shortness of breath however we will admit to the hospital service, we will cover with antibiotics given her chest x-ray findings.  We will attempt to wean off BiPAP back to nasal cannula.  Patient agreeable to plan. ? ?CRITICAL CARE ?Performed by: Harvest Dark ? ? ?Total critical care time: 30 minutes ? ?Critical care time was exclusive of separately billable procedures and treating other patients. ? ?Critical care was necessary to treat or prevent imminent or  life-threatening deterioration. ? ?Critical care was time spent personally by me on the following activities: development of treatment plan with patient and/or surrogate as well as nursing, discussions with consultants, evaluation of patient's response to treatment, examination of patient, obtaining history from patient or surrogate, ordering and performing treatments and interventions, ordering and review of laboratory studies, ordering and review of radiographic studies, pulse oximetry and re-evaluation of patient's condition. ? ? ?FINAL CLINICAL IMPRESSION(S) / ED DIAGNOSES  ? ?COPD exacerbation ? ? ?Note:  This document was prepared using Dragon voice recognition software and may include unintentional dictation errors. ?  ?Harvest Dark, MD ?02/12/22 1116 ? ?  ?Harvest Dark, MD ?02/12/22 1117 ? ?

## 2022-02-12 NOTE — ED Notes (Signed)
RN called to 1C to give report. Per 1C nurse, she is with a pt an is unable to take report at this time. ?

## 2022-02-12 NOTE — ED Notes (Signed)
Full rainbow plus VBG sent down.  ?

## 2022-02-12 NOTE — Consult Note (Signed)
[facility] Face-to-Face Psychiatry Consult  ? ?Reason for Consult:  depression ?Referring Physician:  Agbata ?Patient Identification: Colleen [name] ?MRN:  [id number] ?Principal Diagnosis: GAD (generalized anxiety disorder) ?Diagnosis:  Principal Problem: ?  GAD (generalized anxiety disorder) ?Active Problems: ?  Tobacco use disorder ?  Acute exacerbation of chronic obstructive pulmonary disease (COPD) ([place]) ?  Depression ? ? ?Total Time spent with patient: 45 minutes ? ?Subjective:  "My husband doesn't buy me the proper food to eat and doesn't seem to care." ?Colleen [name] is a 61 y.o. female patient admitted with COPD exacerbation. ? ?HPI:  Patient seen, chart reviewed. Patient in ED. She states she is in pain; has chronic pain. History of depression, passive SI, anxiety.  Patient originally came into the emergency department for "trouble breathing."  Psychiatric consult due to patient expressing passive suicidal thoughts to attending physician.  Will be admitted to the medical unit for treatment. ?On evaluation, patient is alert and oriented x4.  She does appear anxious, somewhat pressured speech.  States she is in pain, "everywhere", chronic.  Patient denies any suicidal thoughts at this time.  She does admit to having passive suicidal thoughts at times, without a plan.  Denies auditory or visual hallucinations or paranoia.  Much of patient's stress seems to be coming from discord at home with her husband.  She states, "he is not physically abusive but he is emotionally."  She states that "he is always on my butt."  (Meaning that he never leaves her alone and she does not have time to talk freely to her psychiatrist (Dr. Shea Evans) during remote visits).  Patient also states that he will do all the grocery shopping and just bring home "junk food."  Patient states that she is not eating well because there is nothing appropriate for her to eat.  She states that she smokes about 4 cigarettes a day to help cut down on her  hunger.  Patient reports sporadic compliance with Seroquel and Remeron.  She states she does take the Celexa every day.  Patient has an appointment with her outpatient psychiatrist next week.  Patient does not require inpatient psychiatric hospitalization at this time.  She can be followed by psychiatry while she is medically hospitalized. ? ? ?Past Psychiatric History: Inpatient at this facility February 2023.  Multiple trials of antidepressants, per chart review adverse reaction with trazodone, Pristiq (anaphylaxis), paroxetine ? ?Risk to Self:   ?Risk to Others:   ?Prior Inpatient Therapy:   ?Prior Outpatient Therapy:   ? ?Past Medical History:  ?Past Medical History:  ?Diagnosis Date  ? Anxiety   ? Asthma   ? Breast cancer ([place]) 2004  ? left breast  ? Chronic back pain   ? COPD (chronic obstructive pulmonary disease) ([place])   ? Depression   ? Personal history of radiation therapy   ?  ?Past Surgical History:  ?Procedure Laterality Date  ? BREAST BIOPSY Left 2004  ? positive  ? BREAST BIOPSY Right   ? neg  ? BREAST LUMPECTOMY Left 2004  ? positive  ? LEFT HEART CATH AND CORONARY ANGIOGRAPHY N/A 11/06/2021  ? Procedure: LEFT HEART CATH AND CORONARY ANGIOGRAPHY;  Surgeon: Corey Skains, MD;  Location: [facility];  Service: Cardiovascular;  Laterality: N/A;  ? MASTECTOMY Left   ? MASTECTOMY    ? NEPHRECTOMY Left   ? TUBAL LIGATION    ? ?Family History:  ?Family History  ?Problem Relation Age of Onset  ? Breast cancer Cousin   ?  Breast cancer Cousin   ? Bipolar disorder Daughter   ? Drug abuse Daughter   ? Alcohol abuse Maternal Grandfather   ? ?Family Psychiatric  History: None reported ?Social History:  ?Social History  ? ?Substance and Sexual Activity  ?Alcohol Use Never  ?   ?Social History  ? ?Substance and Sexual Activity  ?Drug Use Never  ? Comment: prescribed muscle relaxers and valium  ?  ?Social History  ? ?Socioeconomic History  ? Marital status: Married  ?  Spouse name: Not on file  ? Number  of children: 2  ? Years of education: GED  ? Highest education level: Not on file  ?Occupational History  ? Not on file  ?Tobacco Use  ? Smoking status: Every Day  ?  Packs/day: 0.50  ?  Types: Cigarettes  ?  Passive exposure: Never  ? Smokeless tobacco: Never  ?Vaping Use  ? Vaping Use: Never used  ?Substance and Sexual Activity  ? Alcohol use: Never  ? Drug use: Never  ?  Comment: prescribed muscle relaxers and valium  ? Sexual activity: Not Currently  ?Other Topics Concern  ? Not on file  ?Social History Narrative  ? Not on file  ? ?Social Determinants of Health  ? ?Financial Resource Strain: Not on file  ?Food Insecurity: Not on file  ?Transportation Needs: Not on file  ?Physical Activity: Not on file  ?Stress: Not on file  ?Social Connections: Not on file  ? ?Additional Social History: ?  ? ?Allergies:   ?Allergies  ?Allergen Reactions  ? Desvenlafaxine Anaphylaxis  ? Morphine And Related Anaphylaxis  ? Penicillins Anaphylaxis  ?  Has patient had a PCN reaction causing immediate rash, facial/tongue/throat swelling, SOB or lightheadedness with hypotension: Yes ?Has patient had a PCN reaction causing severe rash involving mucus membranes or skin necrosis: No ?Has patient had a PCN reaction that required hospitalization: Unknown ?Has patient had a PCN reaction occurring within the last 10 years: No ?If all of the above answers are "NO", then may proceed with Cephalosporin use. ?  ? Prazosin Other (See Comments)  ? Tamoxifen Anaphylaxis  ? Trazodone Anaphylaxis  ? Clonazepam   ? Codeine Swelling  ? Duloxetine Itching  ? Gabapentin Hives  ?  Rash and swelling.   ? Hydroxyzine Itching  ? Lorazepam   ?  Patient feels this medications makes her to sedated and wants to avoid  ? Paroxetine Hcl Hives  ? Sulfa Antibiotics Itching  ? Cephalexin Rash  ? ? ?Labs:  ?Results for orders placed or performed during the hospital encounter of 02/12/22 (from the past 48 hour(s))  ?CBC     Status: None  ? Collection Time: 02/12/22   8:05 AM  ?Result Value Ref Range  ? WBC 7.6 4.0 - 10.5 K/uL  ? RBC 4.52 3.87 - 5.11 MIL/uL  ? Hemoglobin 13.2 12.0 - 15.0 g/dL  ? HCT 41.2 36.0 - 46.0 %  ? MCV 91.2 80.0 - 100.0 fL  ? MCH 29.2 26.0 - 34.0 pg  ? MCHC 32.0 30.0 - 36.0 g/dL  ? RDW 14.1 11.5 - 15.5 %  ? Platelets 290 150 - 400 K/uL  ? nRBC 0.0 0.0 - 0.2 %  ?  Comment: Performed at [facility], [address] [place], [place] [address]  ?Comprehensive metabolic panel     Status: Abnormal  ? Collection Time: 02/12/22  8:05 AM  ?Result Value Ref Range  ? Sodium 135 135 - 145 mmol/L  ?  Potassium 5.1 3.5 - 5.1 mmol/L  ? Chloride 95 (L) 98 - 111 mmol/L  ? CO2 32 22 - 32 mmol/L  ? Glucose, Bld 108 (H) 70 - 99 mg/dL  ?  Comment: Glucose reference range applies only to samples taken after fasting for at least 8 hours.  ? BUN 10 8 - 23 mg/dL  ? Creatinine, Ser 0.52 0.44 - 1.00 mg/dL  ? Calcium 9.3 8.9 - 10.3 mg/dL  ? Total Protein 7.0 6.5 - 8.1 g/dL  ? Albumin 4.0 3.5 - 5.0 g/dL  ? AST 16 15 - 41 U/L  ? ALT 14 0 - 44 U/L  ? Alkaline Phosphatase 41 38 - 126 U/L  ? Total Bilirubin 0.3 0.3 - 1.2 mg/dL  ? GFR, Estimated >60 >60 mL/min  ?  Comment: (NOTE) ?Calculated using the CKD-EPI Creatinine Equation (2021) ?  ? Anion gap 8 5 - 15  ?  Comment: Performed at [facility], [address] [place], [place] [address]  ?Troponin I (High Sensitivity)     Status: None  ? Collection Time: 02/12/22  8:05 AM  ?Result Value Ref Range  ? Troponin I (High Sensitivity) 7 <18 ng/L  ?  Comment: (NOTE) ?Elevated high sensitivity troponin I (hsTnI) values and significant  ?changes across serial measurements may suggest ACS but many other  ?chronic and acute conditions are known to elevate hsTnI results.  ?Refer to the "Links" section for chest pain algorithms and additional  ?guidance. ?Performed at [facility], [place], ?[place] [address] ?  ?Brain natriuretic peptide     Status: None  ? Collection Time: 02/12/22  8:05 AM  ?Result  Value Ref Range  ? B Natriuretic Peptide 14.5 0.0 - 100.0 pg/mL  ?  Comment: Performed at [facility], [address], [place], [place] [address]  ?Resp Panel by RT-PCR (Flu A&B, Covid) Nasopharyn

## 2022-02-12 NOTE — Assessment & Plan Note (Signed)
Patient admits to continued nicotine use ?Smoking cessation has been discussed with her in detail she declines a nicotine transdermal patch at this time ?

## 2022-02-12 NOTE — ED Triage Notes (Signed)
BIB EMS from home for SOB. Pt called EMS for SOB. Pt was on [place] at home at 2 liters chronic. EMS placed NRB got up to 100%. Pt was using accessory muscles and sucking NRB empty. Placed CPAP. Albuterol admin. '125mg'$  of solumedrol given via IV. Vitals as follows : ?130/90 ?106HR ST ?100% on CPAP ? ?

## 2022-02-13 ENCOUNTER — Telehealth: Admitting: Student in an Organized Health Care Education/Training Program

## 2022-02-13 DIAGNOSIS — J441 Chronic obstructive pulmonary disease with (acute) exacerbation: Secondary | ICD-10-CM | POA: Diagnosis not present

## 2022-02-13 LAB — CBC
HCT: 38.8 % (ref 36.0–46.0)
Hemoglobin: 12.7 g/dL (ref 12.0–15.0)
MCH: 29.2 pg (ref 26.0–34.0)
MCHC: 32.7 g/dL (ref 30.0–36.0)
MCV: 89.2 fL (ref 80.0–100.0)
Platelets: 276 10*3/uL (ref 150–400)
RBC: 4.35 MIL/uL (ref 3.87–5.11)
RDW: 13.4 % (ref 11.5–15.5)
WBC: 13.2 10*3/uL — ABNORMAL HIGH (ref 4.0–10.5)
nRBC: 0 % (ref 0.0–0.2)

## 2022-02-13 LAB — BASIC METABOLIC PANEL
Anion gap: 8 (ref 5–15)
BUN: 11 mg/dL (ref 8–23)
CO2: 27 mmol/L (ref 22–32)
Calcium: 8.8 mg/dL — ABNORMAL LOW (ref 8.9–10.3)
Chloride: 93 mmol/L — ABNORMAL LOW (ref 98–111)
Creatinine, Ser: 0.34 mg/dL — ABNORMAL LOW (ref 0.44–1.00)
GFR, Estimated: >60 mL/min (ref >60)
Glucose, Bld: 112 mg/dL — ABNORMAL HIGH (ref 70–99)
Potassium: 4.5 mmol/L (ref 3.5–5.1)
Sodium: 128 mmol/L — ABNORMAL LOW (ref 135–145)

## 2022-02-13 MED ORDER — GUAIFENESIN-DM 100-10 MG/5ML PO SYRP
5.0000 mL | ORAL_SOLUTION | ORAL | Status: DC | PRN
  Administered 2022-02-13: 5 mL via ORAL
  Filled 2022-02-13: qty 10

## 2022-02-13 MED ORDER — AZITHROMYCIN 500 MG PO TABS
250.0000 mg | ORAL_TABLET | Freq: Every day | ORAL | Status: DC
  Administered 2022-02-14: 250 mg via ORAL
  Filled 2022-02-13: qty 1

## 2022-02-13 MED ORDER — ALBUTEROL SULFATE (2.5 MG/3ML) 0.083% IN NEBU: 2.5000 mg | INHALATION_SOLUTION | Freq: Four times a day (QID) | RESPIRATORY_TRACT | Status: DC | PRN

## 2022-02-13 MED ORDER — SODIUM CHLORIDE 0.9 % IV SOLN: INTRAVENOUS | Status: AC

## 2022-02-13 MED ORDER — AZITHROMYCIN 500 MG PO TABS
500.0000 mg | ORAL_TABLET | Freq: Every day | ORAL | Status: AC
  Administered 2022-02-13: 500 mg via ORAL
  Filled 2022-02-13: qty 1

## 2022-02-13 MED ORDER — IPRATROPIUM BROMIDE 0.02 % IN SOLN
0.5000 mg | Freq: Two times a day (BID) | RESPIRATORY_TRACT | Status: DC
  Administered 2022-02-13 – 2022-02-14 (×2): 0.5 mg via RESPIRATORY_TRACT
  Filled 2022-02-13 (×2): qty 2.5

## 2022-02-13 NOTE — Assessment & Plan Note (Signed)
Sodium decreased to 128 this morning.  Patient appears little dry and also on Celexa at home. ?-Giving some IV fluids with normal saline ?-Monitor sodium ?

## 2022-02-13 NOTE — Assessment & Plan Note (Signed)
Patient has an history of generalized anxiety disorder and follow-up with outpatient psychiatry.  She uses Valium at home. ?-Continue home dose of Valium ?

## 2022-02-13 NOTE — Consult Note (Signed)
Provider went to check on patient just to see how she was doing.  Patient is sleeping comfortably and I did not disturb her. Patient's care team to consult psychiatry as needed ? ?Sherlon Handing, PMHNP-BC ? ?

## 2022-02-13 NOTE — Progress Notes (Signed)
?Progress Note ? ? ?Patient: Colleen [name] LZJ:[id number] DOB: 09-11-1961 DOA: 02/12/2022     1 ?DOS: the patient was seen and examined on 02/13/2022 ?  ?Brief hospital course: ?Taken from H&P. ? ?Colleen [name] is a 61 y.o. female with medical history significant for COPD with chronic respiratory failure on 2 L of oxygen at night, history of breast cancer status post radiation therapy, nicotine dependence, depression, anxiety who presents to the ER via EMS for evaluation of worsening shortness of breath. ?Patient states that she has been in her usual state of health until the last couple of days when she developed increased congestion which she thought was related to allergies.  On the morning of admission she developed worsening shortness of breath and when EMS arrived she was found to be in respiratory distress with use of accessory muscles.  She was placed on CPAP and transported to the ER.  They also administered 125 mg of IV Solu-Medrol. ?She has been weaned off CPAP, initially requiring 4 L of oxygen, now on 2 L of oxygen which is her baseline. ? ?VBG with CO2 of 65, troponin remain negative, respiratory viral panel negative, chest x-ray with some concern of ill-defined nodule in the inferior aspect of the right upper lobe.  CT chest without contrast shows COPD, no focal infiltrate or discrete lung nodule.  A healing fracture with adjacent pleural thickening is seen in the posterolateral aspect of right seventh rib which corresponds to the area of ill-defined nodule seen on the chest radiograph. ?EKG with sinus tachycardia and right axis deviation. ? ?She has a cough productive of clear phlegm and denies having any fever or chills.  She denies having any sick contacts.  She denies having any chest pain, no nausea, no vomiting, no diaphoresis, no palpitations, no leg swelling, no abdominal pain, no changes in her bowel habits, no urinary symptoms, no blurred vision or focal deficit. ?She admits to being depressed  and has occasional suicidal ideations but denies having any homicidal ideation and does not have a plan. ?She was evaluated by psychiatry and they do not think that she need inpatient psych care.  She has an appointment with her psychiatrist next week for and she should follow-up with them. ? ?She was admitted for acute on chronic hypoxic and hypercapnic respiratory failure secondary to COPD exacerbation. ? ?4/6: Respiratory status improving, no wheezing today.  Sodium decreased to 128 and she appears little dry.  Giving some IV fluid.  Patient is also on Celexa at home.  No HCTZ use. ?Mild leukocytosis, most likely secondary to steroid use.  Giving some IV fluid. ?Also started her on Z-Pak for COPD exacerbation. ?If remains stable can be discharged home tomorrow. ? ? ?Assessment and Plan: ?* Acute exacerbation of chronic obstructive pulmonary disease (COPD) ([place]) ?Patient presents for evaluation of worsening shortness of breath from her baseline associated with cough productive of clear phlegm and wheezing. ?Clinically seems improving.  Currently on 2 L of oxygen which is her baseline but she was mostly using oxygen at night.  Little worsening of leukocytosis most likely secondary to Solu-Medrol. ?-Continue with prednisone ?-Start her on Z-Pak ?-Continue with bronchodilators ?-Continue oxygen supplementation to maintain pulse oximetry greater than 94% ?-Continue with supportive care ? ? ?GAD (generalized anxiety disorder) ?Patient has an history of generalized anxiety disorder and follow-up with outpatient psychiatry.  She uses Valium at home. ?-Continue home dose of Valium ? ?Acute hyponatremia ?Sodium decreased to 128 this morning.  Patient appears little  dry and also on Celexa at home. ?-Giving some IV fluids with normal saline ?-Monitor sodium ? ?Depression ?Patient has a history of depression and states that she has episodes of suicidal ideation but denies having a plan. ?She denies having any homicidal  ideations ?Psychiatry evaluated her and she does not need inpatient psych care and they are recommending following up with her on outpatient psychiatrist, patient has an appointment for next week. ?-Continue home meds ? ?Tobacco use disorder ?Patient admits to continued nicotine use ?Smoking cessation has been discussed with her in detail she declines a nicotine transdermal patch at this time ? ?Subjective: Patient was complaining of generalized body aches, more on left leg.  Per patient she followed up at pain clinic and gets pain shots.  Patient denies any suicidal ideation today. ? ? ?Physical Exam: ?Vitals:  ? 02/13/22 0550 02/13/22 0759 02/13/22 0847 02/13/22 1125  ?BP: (!) 139/98  132/87 (!) 129/91  ?Pulse: 88  81 96  ?Resp:   16 20  ?Temp:   98.7 ?F (37.1 ?C) 98.9 ?F (37.2 ?C)  ?TempSrc:      ?SpO2: 97% 95% 94% 91%  ?Weight:      ?Height:      ? ?General.  Anxious appearing lady, in no acute distress. ?Pulmonary.  Lungs clear bilaterally, normal respiratory effort. ?CV.  Regular rate and rhythm, no JVD, rub or murmur. ?Abdomen.  Soft, nontender, nondistended, BS positive. ?CNS.  Alert and oriented .  No focal neurologic deficit. ?Extremities.  No edema, no cyanosis, pulses intact and symmetrical. ?Psychiatry.  Judgment and insight appears normal. ? ?Data Reviewed: ?Prior notes, labs and images reviewed ? ?Family Communication: Discussed with patient ? ?Disposition: ?Status is: Inpatient ?Remains inpatient appropriate because: Severity of illness ? ? Planned Discharge Destination: Home ? ?DVT prophylaxis.  Lovenox ? ?Time spent: 45 minutes ? ?This record has been created using Systems analyst. Errors have been sought and corrected,but may not always be located. Such creation errors do not reflect on the standard of care. ? ?Author: ?Lorella Nimrod, MD ?02/13/2022 1:03 PM ? ?For on call review [email or web address].  ?

## 2022-02-13 NOTE — Hospital Course (Addendum)
Taken from H&P. ? ?Colleen [name] is a 61 y.o. female with medical history significant for COPD with chronic respiratory failure on 2 L of oxygen at night, history of breast cancer status post radiation therapy, nicotine dependence, depression, anxiety who presents to the ER via EMS for evaluation of worsening shortness of breath. ?Patient states that she has been in her usual state of health until the last couple of days when she developed increased congestion which she thought was related to allergies.  On the morning of admission she developed worsening shortness of breath and when EMS arrived she was found to be in respiratory distress with use of accessory muscles.  She was placed on CPAP and transported to the ER.  They also administered 125 mg of IV Solu-Medrol. ?She has been weaned off CPAP, initially requiring 4 L of oxygen, now on 2 L of oxygen which is her baseline. ? ?VBG with CO2 of 65, troponin remain negative, respiratory viral panel negative, chest x-ray with some concern of ill-defined nodule in the inferior aspect of the right upper lobe.  CT chest without contrast shows COPD, no focal infiltrate or discrete lung nodule.  A healing fracture with adjacent pleural thickening is seen in the posterolateral aspect of right seventh rib which corresponds to the area of ill-defined nodule seen on the chest radiograph. ?EKG with sinus tachycardia and right axis deviation. ? ?She has a cough productive of clear phlegm and denies having any fever or chills.  She denies having any sick contacts.  She denies having any chest pain, no nausea, no vomiting, no diaphoresis, no palpitations, no leg swelling, no abdominal pain, no changes in her bowel habits, no urinary symptoms, no blurred vision or focal deficit. ?She admits to being depressed and has occasional suicidal ideations but denies having any homicidal ideation and does not have a plan. ?She was evaluated by psychiatry and they do not think that she need  inpatient psych care.  She has an appointment with her psychiatrist next week for and she should follow-up with them. ? ?She was admitted for acute on chronic hypoxic and hypercapnic respiratory failure secondary to COPD exacerbation. ? ?4/6: Respiratory status improving, no wheezing today.  Sodium decreased to 128 and she appears little dry.  Giving some IV fluid.  Patient is also on Celexa at home.  No HCTZ use. ?Mild leukocytosis, most likely secondary to steroid use.  Giving some IV fluid. ?Also started her on Z-Pak for COPD exacerbation. ? ?Patient did receive some tramadol overnight although listed as allergies. ? ?Patient remained stable and being discharged home on few more days of Zithromax. ? ?Patient need to have a close follow-up with her PCP and psychiatry. ? ?

## 2022-02-14 DIAGNOSIS — J441 Chronic obstructive pulmonary disease with (acute) exacerbation: Secondary | ICD-10-CM | POA: Diagnosis not present

## 2022-02-14 LAB — BASIC METABOLIC PANEL
Anion gap: 8 (ref 5–15)
BUN: 12 mg/dL (ref 8–23)
CO2: 27 mmol/L (ref 22–32)
Calcium: 8.5 mg/dL — ABNORMAL LOW (ref 8.9–10.3)
Chloride: 99 mmol/L (ref 98–111)
Creatinine, Ser: 0.42 mg/dL — ABNORMAL LOW (ref 0.44–1.00)
GFR, Estimated: >60 mL/min (ref >60)
Glucose, Bld: 80 mg/dL (ref 70–99)
Potassium: 4 mmol/L (ref 3.5–5.1)
Sodium: 134 mmol/L — ABNORMAL LOW (ref 135–145)

## 2022-02-14 LAB — CBC
HCT: 39.3 % (ref 36.0–46.0)
Hemoglobin: 12.9 g/dL (ref 12.0–15.0)
MCH: 29.5 pg (ref 26.0–34.0)
MCHC: 32.8 g/dL (ref 30.0–36.0)
MCV: 89.9 fL (ref 80.0–100.0)
Platelets: 243 10*3/uL (ref 150–400)
RBC: 4.37 MIL/uL (ref 3.87–5.11)
RDW: 13.6 % (ref 11.5–15.5)
WBC: 8.7 10*3/uL (ref 4.0–10.5)
nRBC: 0 % (ref 0.0–0.2)

## 2022-02-14 MED ORDER — AZITHROMYCIN 250 MG PO TABS: 250.0000 mg | ORAL_TABLET | Freq: Every day | ORAL | 0 refills | Status: AC

## 2022-02-14 MED ORDER — TRAMADOL HCL 50 MG PO TABS: 50.0000 mg | ORAL_TABLET | Freq: Two times a day (BID) | ORAL | Status: DC | PRN

## 2022-02-14 MED ORDER — GUAIFENESIN-DM 100-10 MG/5ML PO SYRP: 10.0000 mL | ORAL_SOLUTION | ORAL | 0 refills | Status: DC | PRN

## 2022-02-14 MED ORDER — TRAMADOL HCL 50 MG PO TABS
50.0000 mg | ORAL_TABLET | Freq: Once | ORAL | Status: AC
  Administered 2022-02-14: 50 mg via ORAL
  Filled 2022-02-14: qty 1

## 2022-02-14 MED ORDER — GUAIFENESIN-DM 100-10 MG/5ML PO SYRP: 10.0000 mL | ORAL_SOLUTION | ORAL | Status: DC | PRN

## 2022-02-14 MED ORDER — PREDNISONE 20 MG PO TABS: 40.0000 mg | ORAL_TABLET | Freq: Every day | ORAL | 0 refills | Status: AC

## 2022-02-14 NOTE — Discharge Summary (Signed)
?Physician Discharge Summary ?  ?Patient: Colleen [name] MRN: [id number] DOB: 1961-03-15  ?Admit date:     02/12/2022  ?Discharge date: 02/14/22  ?Discharge Physician: Lorella Nimrod  ? ?PCP: Remi Haggard, FNP  ? ?Recommendations at discharge:  ?Please obtain CBC and BMP in 1 week ?Follow-up with primary care provider in 1 week ?Follow-up with your outpatient psychiatrist next week according to the schedule appointment ? ?Discharge Diagnoses: ?Principal Problem: ?  Acute exacerbation of chronic obstructive pulmonary disease (COPD) ([place]) ?Active Problems: ?  GAD (generalized anxiety disorder) ?  Acute hyponatremia ?  Depression ?  Tobacco use disorder ? ?Hospital Course: ?Taken from H&P. ? ?Colleen [name] is a 61 y.o. female with medical history significant for COPD with chronic respiratory failure on 2 L of oxygen at night, history of breast cancer status post radiation therapy, nicotine dependence, depression, anxiety who presents to the ER via EMS for evaluation of worsening shortness of breath. ?Patient states that she has been in her usual state of health until the last couple of days when she developed increased congestion which she thought was related to allergies.  On the morning of admission she developed worsening shortness of breath and when EMS arrived she was found to be in respiratory distress with use of accessory muscles.  She was placed on CPAP and transported to the ER.  They also administered 125 mg of IV Solu-Medrol. ?She has been weaned off CPAP, initially requiring 4 L of oxygen, now on 2 L of oxygen which is her baseline. ? ?VBG with CO2 of 65, troponin remain negative, respiratory viral panel negative, chest x-ray with some concern of ill-defined nodule in the inferior aspect of the right upper lobe.  CT chest without contrast shows COPD, no focal infiltrate or discrete lung nodule.  A healing fracture with adjacent pleural thickening is seen in the posterolateral aspect of right seventh rib  which corresponds to the area of ill-defined nodule seen on the chest radiograph. ?EKG with sinus tachycardia and right axis deviation. ? ?She has a cough productive of clear phlegm and denies having any fever or chills.  She denies having any sick contacts.  She denies having any chest pain, no nausea, no vomiting, no diaphoresis, no palpitations, no leg swelling, no abdominal pain, no changes in her bowel habits, no urinary symptoms, no blurred vision or focal deficit. ?She admits to being depressed and has occasional suicidal ideations but denies having any homicidal ideation and does not have a plan. ?She was evaluated by psychiatry and they do not think that she need inpatient psych care.  She has an appointment with her psychiatrist next week for and she should follow-up with them. ? ?She was admitted for acute on chronic hypoxic and hypercapnic respiratory failure secondary to COPD exacerbation. ? ?4/6: Respiratory status improving, no wheezing today.  Sodium decreased to 128 and she appears little dry.  Giving some IV fluid.  Patient is also on Celexa at home.  No HCTZ use. ?Mild leukocytosis, most likely secondary to steroid use.  Giving some IV fluid. ?Also started her on Z-Pak for COPD exacerbation. ? ?Patient did receive some tramadol overnight although listed as allergies. ? ?Patient remained stable and being discharged home on few more days of Zithromax. ? ?Patient need to have a close follow-up with her PCP and psychiatry. ? ?Assessment and Plan: ?* Acute exacerbation of chronic obstructive pulmonary disease (COPD) ([place]) ?Patient presents for evaluation of worsening shortness of breath from her baseline associated  with cough productive of clear phlegm and wheezing. ?Clinically seems improving.  Currently on 2 L of oxygen which is her baseline but she was mostly using oxygen at night.  Little worsening of leukocytosis most likely secondary to Solu-Medrol. ?-Continue with prednisone ?-Start her on  Z-Pak ?-Continue with bronchodilators ?-Continue oxygen supplementation to maintain pulse oximetry greater than 94% ?-Continue with supportive care ? ? ?GAD (generalized anxiety disorder) ?Patient has an history of generalized anxiety disorder and follow-up with outpatient psychiatry.  She uses Valium at home. ?-Continue home dose of Valium ? ?Acute hyponatremia ?Sodium decreased to 128 this morning.  Patient appears little dry and also on Celexa at home. ?-Giving some IV fluids with normal saline ?-Monitor sodium ? ?Depression ?Patient has a history of depression and states that she has episodes of suicidal ideation but denies having a plan. ?She denies having any homicidal ideations ?Psychiatry evaluated her and she does not need inpatient psych care and they are recommending following up with her on outpatient psychiatrist, patient has an appointment for next week. ?-Continue home meds ? ?Tobacco use disorder ?Patient admits to continued nicotine use ?Smoking cessation has been discussed with her in detail she declines a nicotine transdermal patch at this time ? ? ?Consultants: None ?Procedures performed: None ?Disposition: Home ?Diet recommendation:  ?Discharge Diet Orders (From admission, onward)  ? ?  Start     Ordered  ? 02/14/22 0000  Diet - low sodium heart healthy       ? 02/14/22 1320  ? ?  ?  ? ?  ? ?Regular diet ?DISCHARGE MEDICATION: ?Allergies as of 02/14/2022   ? ?   Reactions  ? Desvenlafaxine Anaphylaxis  ? Morphine And Related Anaphylaxis  ? Penicillins Anaphylaxis  ? Has patient had a PCN reaction causing immediate rash, facial/tongue/throat swelling, SOB or lightheadedness with hypotension: Yes ?Has patient had a PCN reaction causing severe rash involving mucus membranes or skin necrosis: No ?Has patient had a PCN reaction that required hospitalization: Unknown ?Has patient had a PCN reaction occurring within the last 10 years: No ?If all of the above answers are "NO", then may proceed with  Cephalosporin use.  ? Prazosin Other (See Comments)  ? Tamoxifen Anaphylaxis  ? Trazodone Anaphylaxis  ? Clonazepam   ? Codeine Swelling  ? Duloxetine Itching  ? Gabapentin Hives  ? Rash and swelling.   ? Hydroxyzine Itching  ? Lorazepam   ? Patient feels this medications makes her to sedated and wants to avoid  ? Paroxetine Hcl Hives  ? Sulfa Antibiotics Itching  ? Cephalexin Rash  ? ?  ? ?  ?Medication List  ?  ? ?TAKE these medications   ? ?albuterol 108 (90 Base) MCG/ACT inhaler ?Commonly known as: VENTOLIN HFA ?Inhale 1-2 puffs into the lungs every 4 (four) hours as needed for cough or wheezing. ?  ?azithromycin 250 MG tablet ?Commonly known as: ZITHROMAX ?Take 1 tablet (250 mg total) by mouth daily for 4 days. ?  ?cholecalciferol 25 MCG (1000 UNIT) tablet ?Commonly known as: VITAMIN D3 ?Take 1,000 Units by mouth daily. ?  ?citalopram 10 MG tablet ?Commonly known as: CELEXA ?Take 1 tablet (10 mg total) by mouth daily. ?  ?cyclobenzaprine 10 MG tablet ?Commonly known as: FLEXERIL ?Take 1 tablet (10 mg total) by mouth 3 (three) times daily as needed for muscle spasms. ?  ?denosumab 60 MG/ML Sosy injection ?Commonly known as: PROLIA ?Inject 60 mg into the skin every 6 (six) months. ?  ?  diazepam 2 MG tablet ?Commonly known as: VALIUM ?Take 1 tablet (2 mg total) by mouth daily as needed for anxiety. For severe panic symptoms ?  ?EPINEPHrine 0.3 mg/0.3 mL Soaj injection ?Commonly known as: EPI-PEN ?Inject 0.3 mg into the muscle as needed for anaphylaxis. ?  ?fluticasone 50 MCG/ACT nasal spray ?Commonly known as: FLONASE ?Place 2 sprays into both nostrils daily. ?  ?guaiFENesin-dextromethorphan 100-10 MG/5ML syrup ?Commonly known as: ROBITUSSIN DM ?Take 10 mLs by mouth every 4 (four) hours as needed for cough. ?  ?ipratropium 0.02 % nebulizer solution ?Commonly known as: ATROVENT ?Take 0.25 mg by nebulization 4 (four) times daily. ?  ?mirtazapine 15 MG tablet ?Commonly known as: REMERON ?Take 1 tablet (15 mg total) by  mouth at bedtime. ?  ?predniSONE 20 MG tablet ?Commonly known as: DELTASONE ?Take 2 tablets (40 mg total) by mouth daily with breakfast for 4 days. ?  ?QUEtiapine 200 MG tablet ?Commonly known as: SEROQUEL ?Take

## 2022-02-14 NOTE — TOC Initial Note (Signed)
Transition of Care (TOC) - Initial/Assessment Note  ? ? ?Patient Details  ?Name: Colleen [name] ?MRN: [id number] ?Date of Birth: [birth year] ? ?Transition of Care (TOC) CM/SW Contact:    ?Pete Pelt, RN ?Phone Number: ?02/14/2022, 2:09 PM ? ?Clinical Narrative:    Patient states she does not feel completely safe at home but does not feel she is being abused.  She states that she does not feel the need to go to a shelter or call protective services, but going home gives her anxiety. Hospitalist informed of this situation. ? ?When asked if she has somewhere else to go, she states she does not.   ? ?RNCM and Care nurse spoke with patient, and she states she wants to go home to be with her husband, because the doctor says she has no choice.  RNCM and Care nurse spoke with patient in detail and patient states she is safe and comfortable at home and she wants to return home.      ?She verified this numerous times to both staff members.  Hospitalist aware. ? ?     ? ? ?Expected Discharge Plan: Home/Self Care ?Barriers to Discharge: Continued Medical Work up ? ? ?Patient Goals and CMS Choice ?  ?  ?  ? ?Expected Discharge Plan and Services ?Expected Discharge Plan: Home/Self Care ?  ?  ?  ?Living arrangements for the past 2 months: [place] ?Expected Discharge Date: 02/14/22               ?  ?  ?  ?  ?  ?  ?  ?  ?  ?  ? ?Prior Living Arrangements/Services ?Living arrangements for the past 2 months: [place] ?Lives with:: Self, Spouse ?Patient language and need for interpreter reviewed:: Yes (no interpreter required) ?Do you feel safe going back to the place where you live?: Yes      ?Need for Family Participation in Patient Care: Yes (Comment) ?Care giver support system in place?: Yes (comment) ?  ?Criminal Activity/Legal Involvement Pertinent to Current Situation/Hospitalization: No - Comment as needed ? ?Activities of Daily Living ?Home Assistive Devices/Equipment: Cane (specify quad or straight),  Nebulizer, Oxygen ?ADL Screening (condition at time of admission) ?Patient's cognitive ability adequate to safely complete daily activities?: Yes ?Is the patient deaf or have difficulty hearing?: No ?Does the patient have difficulty seeing, even when wearing glasses/contacts?: No ?Does the patient have difficulty concentrating, remembering, or making decisions?: No ?Patient able to express need for assistance with ADLs?: Yes ?Does the patient have difficulty dressing or bathing?: No ?Independently performs ADLs?: Yes (appropriate for developmental age) ?Does the patient have difficulty walking or climbing stairs?: No ?Weakness of Legs: None ?Weakness of Arms/Hands: None ? ?Permission Sought/Granted ?Permission sought to share information with : Case Manager ?Permission granted to share information with : Yes, Verbal Permission Granted ?   ?   ?   ?   ? ?Emotional Assessment ?Appearance:: Appears stated age ?Attitude/Demeanor/Rapport: Apprehensive ?Affect (typically observed): Depressed (Patient verbalizes she has chronic anxiety and depression) ?Orientation: : Oriented to Self, Oriented to Place, Oriented to  Time, Oriented to Situation ?Alcohol / Substance Use: Illicit Drugs ?Psych Involvement: Yes (comment) ? ?Admission diagnosis:  COPD exacerbation ([place]) [J44.1] ?COPD (chronic obstructive pulmonary disease) with emphysema ([place]) [J43.9] ?Patient Active Problem List  ? Diagnosis Date Noted  ? Depression   ? Major depressive disorder, recurrent severe without psychotic features ([place]) 12/13/2021  ? At risk for prolonged QT interval  syndrome 11/19/2021  ? Acute exacerbation of chronic obstructive pulmonary disease (COPD) ([place]) 10/20/2021  ? Acute hyponatremia 10/19/2021  ? Tobacco use disorder 06/19/2021  ? PTSD (post-traumatic stress disorder) 03/04/2021  ? Cervical myofascial pain syndrome 07/09/2020  ? Chronic pain syndrome 06/04/2020  ? Osteopenia of multiple sites 06/04/2020  ? Age related osteoporosis 06/04/2020   ? Low back pain 04/27/2020  ? Panic attack 03/20/2020  ? COPD (chronic obstructive pulmonary disease) ([place]) 10/11/2018  ? GAD (generalized anxiety disorder) 10/11/2018  ? ?PCP:  Remi Haggard, FNP ?Pharmacy:   ?[id number] [place]?Phone: [phone]: [phone]? ? ? ? ?Social Determinants of Health (SDOH) Interventions ?  ? ?Readmission Risk Interventions ? ?  02/14/2022  ?  1:57 PM 12/06/2021  ? 10:23 AM  ?Readmission Risk Prevention Plan  ?Transportation Screening Complete Complete  ?PCP or Specialist Appt within 5-7 Days  Complete  ?Medication Review (RN CM)  Complete  ?Medication Review (Press photographer Complete   ?PCP or Specialist appointment within 3-5 days of discharge Complete   ?Palliative Care Screening Not Applicable   ?[place] Applic[place]   ? ? ? ?

## 2022-02-14 NOTE — Progress Notes (Signed)
Mobility Specialist - Progress Note ? ? 02/14/22 1000  ?Mobility  ?Activity Contraindicated/medical hold  ? ? ? ?Session attempted. Upon arrival, pt lying supine and voiced complaints of dizziness---BP checked 103/78. Pt also reports back, neck, hip, and tailbone pain. States "my ears feel plugged like how it feels before you pass out". Mobility contraindicated at this time. RN notified.  ? ? ?Kathee Delton ?Mobility Specialist ?02/14/22, 10:46 AM ? ? ? ? ?

## 2022-02-14 NOTE — Progress Notes (Signed)
Cross Cover ?Dose of tramadol and k pad ordered for reports of 10/10 hip pain. Review of chart shows patient  with chronic pain sndrome from lumbar rediculopathy for which she gets routine steroid injections, last being 02/10/22 ?

## 2022-02-17 LAB — CULTURE, BLOOD (ROUTINE X 2)
Culture: NO GROWTH
Culture: NO GROWTH

## 2022-02-20 ENCOUNTER — Telehealth (INDEPENDENT_AMBULATORY_CARE_PROVIDER_SITE_OTHER): Admitting: Psychiatry

## 2022-02-20 ENCOUNTER — Encounter: Payer: Self-pay | Admitting: Psychiatry

## 2022-02-20 DIAGNOSIS — E871 Hypo-osmolality and hyponatremia: Secondary | ICD-10-CM

## 2022-02-20 DIAGNOSIS — F411 Generalized anxiety disorder: Secondary | ICD-10-CM | POA: Diagnosis not present

## 2022-02-20 DIAGNOSIS — F3342 Major depressive disorder, recurrent, in full remission: Secondary | ICD-10-CM

## 2022-02-20 DIAGNOSIS — F17200 Nicotine dependence, unspecified, uncomplicated: Secondary | ICD-10-CM

## 2022-02-20 DIAGNOSIS — F4310 Post-traumatic stress disorder, unspecified: Secondary | ICD-10-CM

## 2022-02-20 DIAGNOSIS — G4701 Insomnia due to medical condition: Secondary | ICD-10-CM | POA: Diagnosis not present

## 2022-02-20 MED ORDER — MIRTAZAPINE 15 MG PO TABS: 15.0000 mg | ORAL_TABLET | Freq: Every day | ORAL | 0 refills | Status: DC

## 2022-02-20 MED ORDER — QUETIAPINE FUMARATE 200 MG PO TABS: 200.0000 mg | ORAL_TABLET | Freq: Every day | ORAL | 0 refills | Status: DC

## 2022-02-20 MED ORDER — CITALOPRAM HYDROBROMIDE 10 MG PO TABS: 10.0000 mg | ORAL_TABLET | Freq: Every day | ORAL | 0 refills | Status: DC

## 2022-02-20 NOTE — Progress Notes (Signed)
Virtual Visit via Video Note ? ?I connected with Colleen [name] on 02/20/22 at 10:40 AM EDT by a video enabled telemedicine application and verified that I am speaking with the correct person using two identifiers. ? ?Location ?Provider Location : [facility] ?Patient Location : Home ? ?Participants: Patient , Provider ? ?  ?I discussed the limitations of evaluation and management by telemedicine and the availability of in person appointments. The patient expressed understanding and agreed to proceed. ? ?  ?I discussed the assessment and treatment plan with the patient. The patient was provided an opportunity to ask questions and all were answered. The patient agreed with the plan and demonstrated an understanding of the instructions. ?  ?The patient was advised to call back or seek an in-person evaluation if the symptoms worsen or if the condition fails to improve as anticipated. ? ? ?[facility] MD OP Progress Note ? ?02/20/2022 1:27 PM ?Colleen [name]  ?MRN:  [id number] ? ?Chief Complaint:  ?Chief Complaint  ?Patient presents with  ? Follow-up: 61 year old Caucasian female with history of MDD, PTSD, insomnia, chronic back pain, presented for medication management.  ? ?HPI: Colleen [name] is a 61 year old Caucasian female, married, lives in [place], has a history of PTSD, MDD, insomnia, chronic back pain, COPD, hyponatremia, history of mastectomy, radiation therapy per history, surgical history of nephrectomy, tubal ligation, chronic hypoxic respiratory failure, pleuritic chest pain was evaluated by telemedicine today. ? ?Patient was last evaluated on 12/13/2021.  Patient at that visit was referred for inpatient behavioral health admission.  Patient was admitted to the unit was released however did not follow up with writer up until today. ? ?Patient had another medical admission on February 12, 2022-reviewed notes per Dr.Amin-discharged on 02/14/2022-acute exacerbation of COPD.  Patient was advised to get CBC and BMP in a week after  discharge and was advised to follow up with primary care provider.  Patient today reports she has not followed through with primary care yet however agrees to make an appointment. ? ?Patient reports she does have bouts of anxiety on and off however since the past few days she is improving.  She denies any significant sadness or crying spells. ? ?She reports sleep as good. ? ?Patient reports she has been using Valium as needed and has been limiting it. ? ?Patient denies any suicidality, homicidality or perceptual disturbances. ? ?Continues to follow-up with her therapist every 2 weeks and reports therapy sessions are beneficial. ? ?Patient denies any other concerns today. ? ?Visit Diagnosis:  ?  ICD-10-CM   ?1. MDD (major depressive disorder), recurrent, in full remission ([place])  F33.42 QUEtiapine (SEROQUEL) 200 MG tablet  ?  mirtazapine (REMERON) 15 MG tablet  ?  citalopram (CELEXA) 10 MG tablet  ?  ?2. GAD (generalized anxiety disorder)  F41.1 QUEtiapine (SEROQUEL) 200 MG tablet  ?  mirtazapine (REMERON) 15 MG tablet  ?  citalopram (CELEXA) 10 MG tablet  ?  ?3. PTSD (post-traumatic stress disorder)  F43.10 QUEtiapine (SEROQUEL) 200 MG tablet  ?  mirtazapine (REMERON) 15 MG tablet  ?  citalopram (CELEXA) 10 MG tablet  ?  ?4. Insomnia due to medical condition  G47.01 QUEtiapine (SEROQUEL) 200 MG tablet  ?  mirtazapine (REMERON) 15 MG tablet  ? mood , pain  ?  ?5. Tobacco use disorder  F17.200   ?  ?6. Hyponatremia  E87.1 Sodium  ?  ? ? ?Past Psychiatric History: .  Reviewed past psychiatric history from progress note on 03/04/2021.  Past trials of Paxil,  Klonopin, trazodone, prazosin, desvenlafaxine, lorazepam. ? ?Past Medical History:  ?Past Medical History:  ?Diagnosis Date  ? Anxiety   ? Asthma   ? Breast cancer ([place]) 2004  ? left breast  ? Chronic back pain   ? COPD (chronic obstructive pulmonary disease) ([place])   ? Depression   ? History of kidney surgery   ? Personal history of radiation therapy   ? PTSD  (post-traumatic stress disorder)   ?  ?Past Surgical History:  ?Procedure Laterality Date  ? BREAST BIOPSY Left 2004  ? positive  ? BREAST BIOPSY [name]   ? neg  ? BREAST LUMPECTOMY Left 2004  ? positive  ? LEFT HEART CATH AND CORONARY ANGIOGRAPHY N/A 11/06/2021  ? Procedure: LEFT HEART CATH AND CORONARY ANGIOGRAPHY;  Surgeon: Corey Skains, MD;  Location: [facility];  Service: Cardiovascular;  Laterality: N/A;  ? MASTECTOMY Left   ? MASTECTOMY    ? NEPHRECTOMY Left   ? TUBAL LIGATION    ? ? ?Family Psychiatric History: Reviewed family psychiatric history from progress note on 03/04/2021. ? ?Family History:  ?Family History  ?Problem Relation Age of Onset  ? Breast cancer Cousin   ? Breast cancer Cousin   ? Bipolar disorder Daughter   ? Drug abuse Daughter   ? Alcohol abuse Maternal Grandfather   ? ? ?Social History: Reviewed social history from progress note on 03/04/2021. ?Social History  ? ?Socioeconomic History  ? Marital status: Married  ?  Spouse name: Not on file  ? Number of children: 2  ? Years of education: GED  ? Highest education level: Not on file  ?Occupational History  ? Not on file  ?Tobacco Use  ? Smoking status: Every Day  ?  Packs/day: 0.50  ?  Types: Cigarettes  ?  Passive exposure: Never  ? Smokeless tobacco: Never  ?Vaping Use  ? Vaping Use: Never used  ?Substance and Sexual Activity  ? Alcohol use: Never  ? Drug use: Never  ?  Comment: prescribed muscle relaxers and valium  ? Sexual activity: Not Currently  ?Other Topics Concern  ? Not on file  ?Social History Narrative  ? Not on file  ? ?Social Determinants of Health  ? ?Financial Resource Strain: Not on file  ?Food Insecurity: Not on file  ?Transportation Needs: Not on file  ?Physical Activity: Not on file  ?Stress: Not on file  ?Social Connections: Not on file  ? ? ?Allergies:  ?Allergies  ?Allergen Reactions  ? Desvenlafaxine Anaphylaxis  ? Morphine And Related Anaphylaxis  ? Penicillins Anaphylaxis  ?  Has patient had a PCN  reaction causing immediate rash, facial/tongue/throat swelling, SOB or lightheadedness with hypotension: Yes ?Has patient had a PCN reaction causing severe rash involving mucus membranes or skin necrosis: No ?Has patient had a PCN reaction that required hospitalization: Unknown ?Has patient had a PCN reaction occurring within the last 10 years: No ?If all of the above answers are "NO", then may proceed with Cephalosporin use. ?  ? Prazosin Other (See Comments)  ? Tamoxifen Anaphylaxis  ? Trazodone Anaphylaxis  ? Clonazepam   ? Codeine Swelling  ? Duloxetine Itching  ? Gabapentin Hives  ?  Rash and swelling.   ? Hydroxyzine Itching  ? Lorazepam   ?  Patient feels this medications makes her to sedated and wants to avoid  ? Paroxetine Hcl Hives  ? Sulfa Antibiotics Itching  ? Cephalexin Rash  ? ? ?Metabolic Disorder Labs: ?Lab Results  ?  Component Value Date  ? HGBA1C 5.8 (H) 12/18/2021  ? MPG 119.76 12/18/2021  ? MPG 108.28 10/12/2018  ? ?No results found for: PROLACTIN ?Lab Results  ?Component Value Date  ? CHOL 180 11/05/2021  ? TRIG 49 11/05/2021  ? HDL 67 11/05/2021  ? CHOLHDL 2.7 11/05/2021  ? VLDL 10 11/05/2021  ? LDLCALC 103 (H) 11/05/2021  ? ?Lab Results  ?Component Value Date  ? TSH 0.864 10/21/2021  ? TSH 0.616 03/05/2021  ? ? ?Therapeutic Level Labs: ?No results found for: LITHIUM ?No results found for: VALPROATE ?No components found for:  CBMZ ? ?Current Medications: ?Current Outpatient Medications  ?Medication Sig Dispense Refill  ? albuterol (VENTOLIN HFA) 108 (90 Base) MCG/ACT inhaler Inhale 1-2 puffs into the lungs every 4 (four) hours as needed for cough or wheezing.    ? cholecalciferol (VITAMIN D3) 25 MCG (1000 UNIT) tablet Take 1,000 Units by mouth daily.    ? citalopram (CELEXA) 10 MG tablet Take 1 tablet (10 mg total) by mouth daily. 90 tablet 0  ? cyclobenzaprine (FLEXERIL) 10 MG tablet Take 1 tablet (10 mg total) by mouth 3 (three) times daily as needed for muscle spasms. 30 tablet 0  ?  denosumab (PROLIA) 60 MG/ML SOSY injection Inject 60 mg into the skin every 6 (six) months.    ? diazepam (VALIUM) 2 MG tablet Take 1 tablet (2 mg total) by mouth daily as needed for anxiety. For severe panic symptoms 15 t

## 2022-02-22 ENCOUNTER — Other Ambulatory Visit: Payer: Self-pay

## 2022-02-22 ENCOUNTER — Emergency Department

## 2022-02-22 ENCOUNTER — Inpatient Hospital Stay
Admission: EM | Admit: 2022-02-22 | Discharge: 2022-02-25 | DRG: 190 | Disposition: A | Discharge status: Home / Self Care | Attending: Internal Medicine | Admitting: Internal Medicine

## 2022-02-22 ENCOUNTER — Encounter: Payer: Self-pay | Admitting: Emergency Medicine

## 2022-02-22 DIAGNOSIS — F410 Panic disorder [episodic paroxysmal anxiety] without agoraphobia: Secondary | ICD-10-CM | POA: Diagnosis not present

## 2022-02-22 DIAGNOSIS — G8929 Other chronic pain: Secondary | ICD-10-CM | POA: Diagnosis not present

## 2022-02-22 DIAGNOSIS — F4310 Post-traumatic stress disorder, unspecified: Secondary | ICD-10-CM | POA: Diagnosis present

## 2022-02-22 DIAGNOSIS — Z880 Allergy status to penicillin: Secondary | ICD-10-CM | POA: Diagnosis not present

## 2022-02-22 DIAGNOSIS — Z881 Allergy status to other antibiotic agents status: Secondary | ICD-10-CM

## 2022-02-22 DIAGNOSIS — M545 Low back pain, unspecified: Secondary | ICD-10-CM | POA: Diagnosis present

## 2022-02-22 DIAGNOSIS — Z923 Personal history of irradiation: Secondary | ICD-10-CM | POA: Diagnosis not present

## 2022-02-22 DIAGNOSIS — Z888 Allergy status to other drugs, medicaments and biological substances status: Secondary | ICD-10-CM

## 2022-02-22 DIAGNOSIS — R0602 Shortness of breath: Secondary | ICD-10-CM | POA: Diagnosis present

## 2022-02-22 DIAGNOSIS — J9621 Acute and chronic respiratory failure with hypoxia: Secondary | ICD-10-CM | POA: Diagnosis not present

## 2022-02-22 DIAGNOSIS — Z905 Acquired absence of kidney: Secondary | ICD-10-CM | POA: Diagnosis not present

## 2022-02-22 DIAGNOSIS — Z9012 Acquired absence of left breast and nipple: Secondary | ICD-10-CM

## 2022-02-22 DIAGNOSIS — F32A Depression, unspecified: Secondary | ICD-10-CM | POA: Diagnosis present

## 2022-02-22 DIAGNOSIS — F17210 Nicotine dependence, cigarettes, uncomplicated: Secondary | ICD-10-CM | POA: Diagnosis present

## 2022-02-22 DIAGNOSIS — E871 Hypo-osmolality and hyponatremia: Secondary | ICD-10-CM | POA: Diagnosis present

## 2022-02-22 DIAGNOSIS — M542 Cervicalgia: Secondary | ICD-10-CM | POA: Diagnosis not present

## 2022-02-22 DIAGNOSIS — Z79899 Other long term (current) drug therapy: Secondary | ICD-10-CM | POA: Diagnosis not present

## 2022-02-22 DIAGNOSIS — F411 Generalized anxiety disorder: Secondary | ICD-10-CM | POA: Diagnosis present

## 2022-02-22 DIAGNOSIS — Z885 Allergy status to narcotic agent status: Secondary | ICD-10-CM

## 2022-02-22 DIAGNOSIS — R0600 Dyspnea, unspecified: Secondary | ICD-10-CM

## 2022-02-22 DIAGNOSIS — J441 Chronic obstructive pulmonary disease with (acute) exacerbation: Principal | ICD-10-CM | POA: Diagnosis present

## 2022-02-22 DIAGNOSIS — Z803 Family history of malignant neoplasm of breast: Secondary | ICD-10-CM

## 2022-02-22 DIAGNOSIS — Z20822 Contact with and (suspected) exposure to covid-19: Secondary | ICD-10-CM | POA: Diagnosis present

## 2022-02-22 DIAGNOSIS — F17200 Nicotine dependence, unspecified, uncomplicated: Secondary | ICD-10-CM | POA: Diagnosis present

## 2022-02-22 DIAGNOSIS — Z9981 Dependence on supplemental oxygen: Secondary | ICD-10-CM | POA: Diagnosis not present

## 2022-02-22 DIAGNOSIS — Z853 Personal history of malignant neoplasm of breast: Secondary | ICD-10-CM

## 2022-02-22 LAB — BLOOD GAS, VENOUS
Acid-Base Excess: 2.7 mmol/L — ABNORMAL HIGH (ref 0.0–2.0)
Bicarbonate: 28.9 mmol/L — ABNORMAL HIGH (ref 20.0–28.0)
O2 Saturation: 81.7 %
Patient temperature: 37
pCO2, Ven: 50 mmHg (ref 44–60)
pH, Ven: 7.37 (ref 7.25–7.43)
pO2, Ven: 46 mmHg — ABNORMAL HIGH (ref 32–45)

## 2022-02-22 LAB — TSH: TSH: 1.074 u[IU]/mL (ref 0.350–4.500)

## 2022-02-22 LAB — BASIC METABOLIC PANEL
Anion gap: 8 (ref 5–15)
BUN: 5 mg/dL — ABNORMAL LOW (ref 8–23)
CO2: 27 mmol/L (ref 22–32)
Calcium: 8.8 mg/dL — ABNORMAL LOW (ref 8.9–10.3)
Chloride: 98 mmol/L (ref 98–111)
Creatinine, Ser: 0.48 mg/dL (ref 0.44–1.00)
GFR, Estimated: >60 mL/min (ref >60)
Glucose, Bld: 98 mg/dL (ref 70–99)
Potassium: 3.9 mmol/L (ref 3.5–5.1)
Sodium: 133 mmol/L — ABNORMAL LOW (ref 135–145)

## 2022-02-22 LAB — RESP PANEL BY RT-PCR (FLU A&B, COVID) ARPGX2
Influenza A by PCR: NEGATIVE
Influenza B by PCR: NEGATIVE
SARS Coronavirus 2 by RT PCR: NEGATIVE

## 2022-02-22 LAB — PROCALCITONIN: Procalcitonin: <0.1 ng/mL

## 2022-02-22 LAB — CBC
HCT: 41.4 % (ref 36.0–46.0)
Hemoglobin: 13.4 g/dL (ref 12.0–15.0)
MCH: 29.6 pg (ref 26.0–34.0)
MCHC: 32.4 g/dL (ref 30.0–36.0)
MCV: 91.6 fL (ref 80.0–100.0)
Platelets: 309 10*3/uL (ref 150–400)
RBC: 4.52 MIL/uL (ref 3.87–5.11)
RDW: 14.9 % (ref 11.5–15.5)
WBC: 9.9 10*3/uL (ref 4.0–10.5)
nRBC: 0 % (ref 0.0–0.2)

## 2022-02-22 LAB — BRAIN NATRIURETIC PEPTIDE: B Natriuretic Peptide: 11.7 pg/mL (ref 0.0–100.0)

## 2022-02-22 LAB — T4, FREE: Free T4: 0.97 ng/dL (ref 0.61–1.12)

## 2022-02-22 LAB — TROPONIN I (HIGH SENSITIVITY): Troponin I (High Sensitivity): 6 ng/L (ref <18)

## 2022-02-22 IMAGING — CR DG CHEST 2V
2 series · 2 of 2 positions shown · non-contrast
Comparison: [DATE]

CLINICAL DATA: Shortness of breath.

EXAM:
CHEST - 2 VIEW

[chest lat]
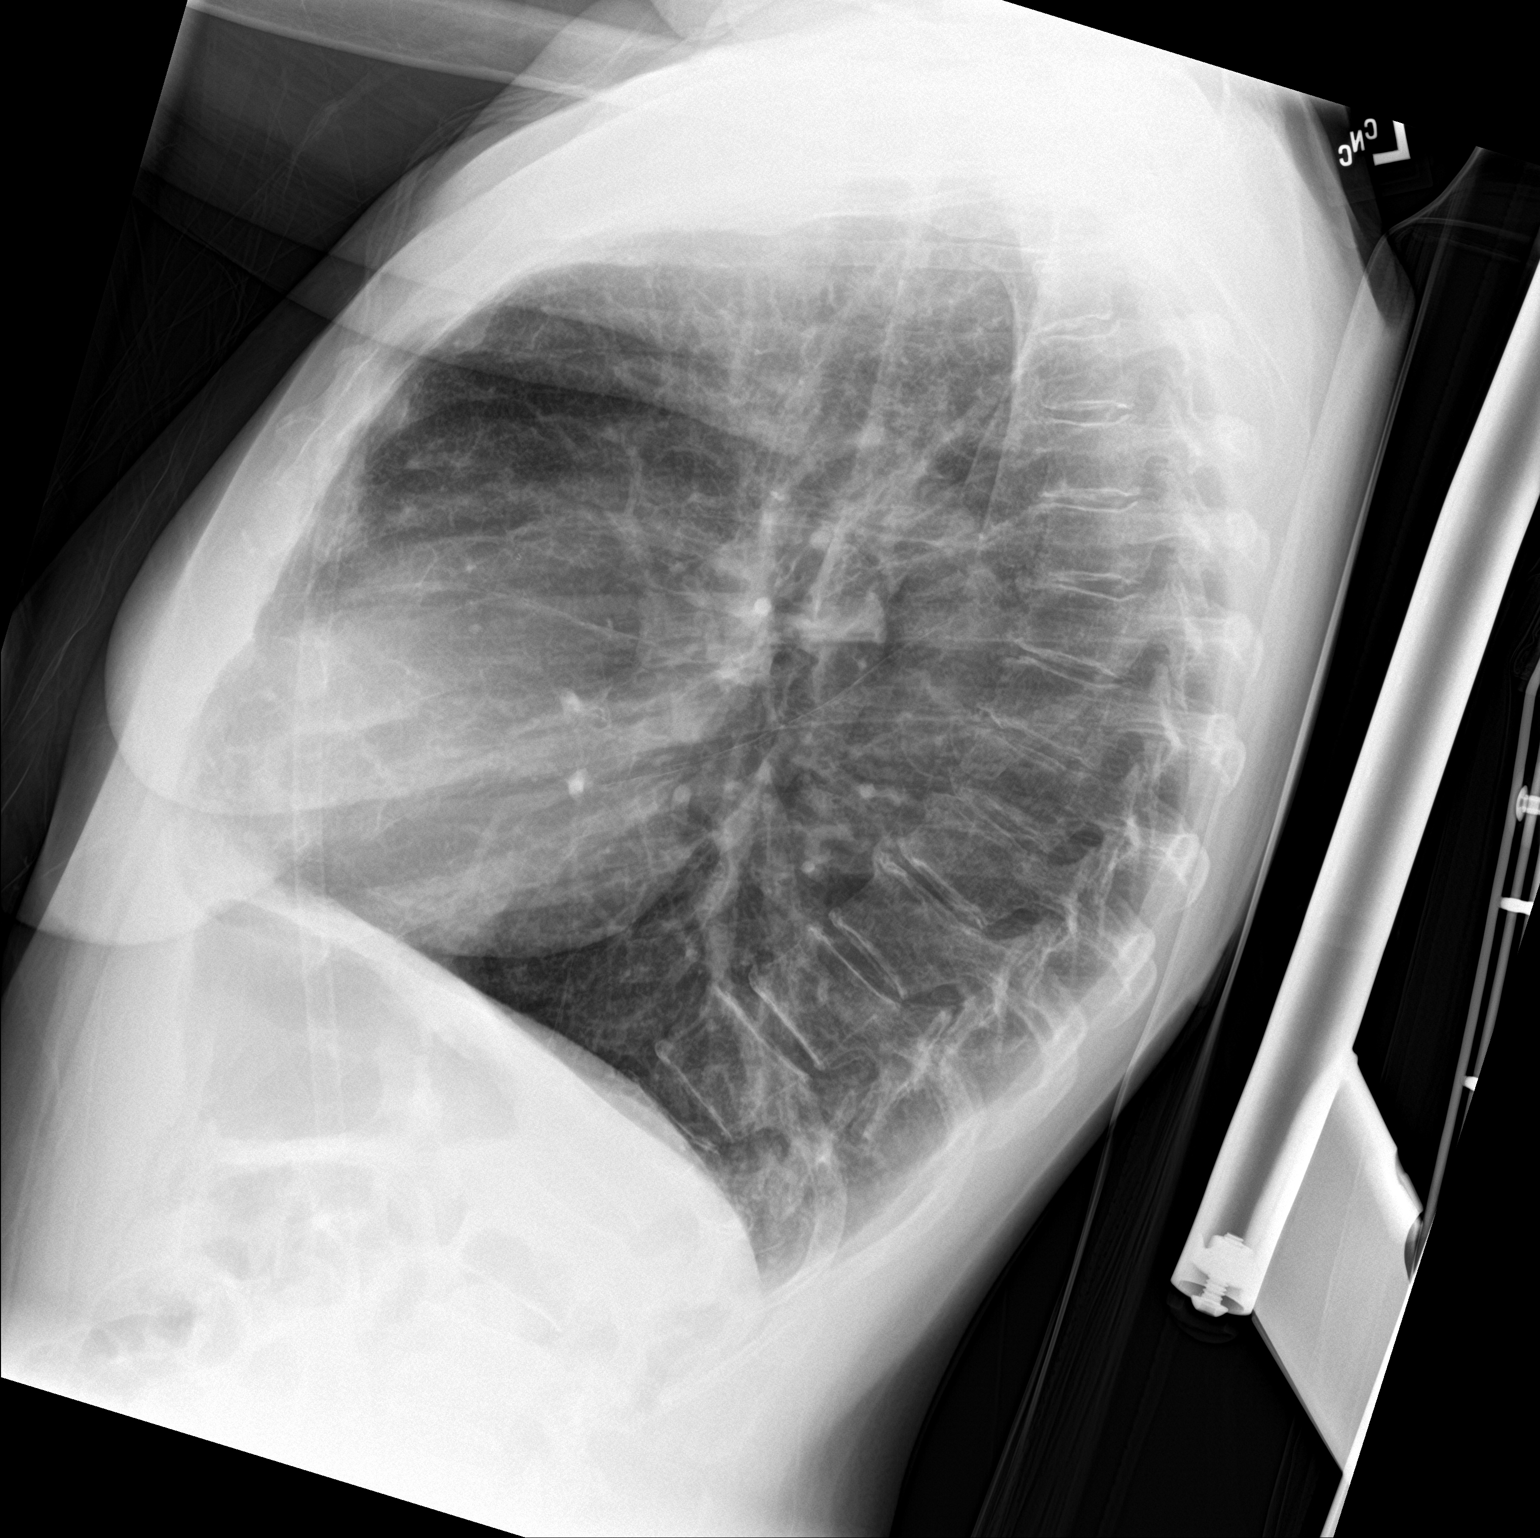

[chest ap]
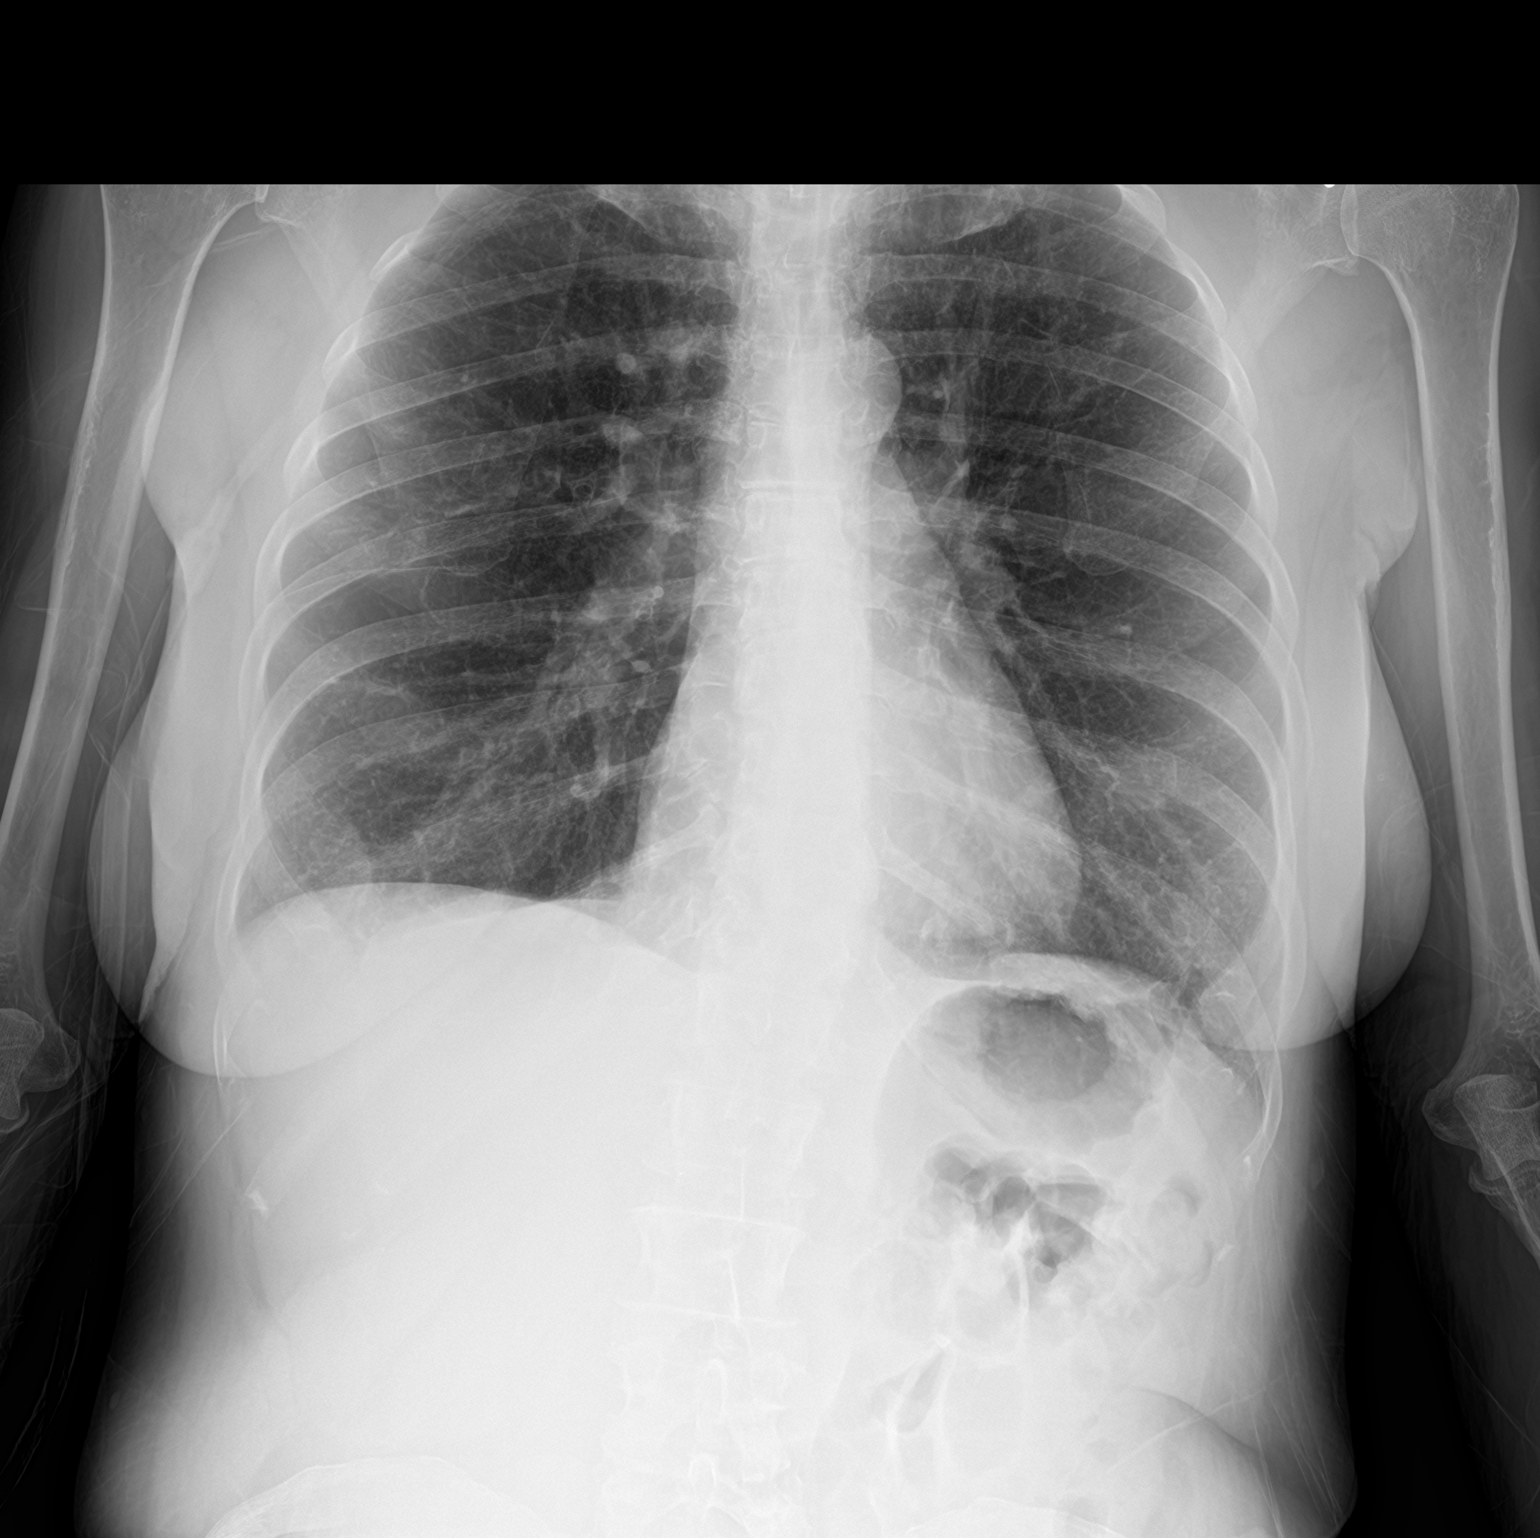

[2 of 2 positions shown; findings below may reference images not displayed]

FINDINGS: The heart size and mediastinal contours are within normal limits. No
pleural effusion or edema. Both lungs are clear. Healing right
posterior seventh rib fracture deformity is again noted and appears
similar to previous exam.
IMPRESSION: No active cardiopulmonary abnormalities.

## 2022-02-22 IMAGING — CT CT ANGIO CHEST
2 of 7 series · 18 of 46 positions shown · IV contrast (agent unspecified)
Comparison: Chest CT, [DATE].

CLINICAL DATA: Shortness of breath beginning last night with

EXAM:
CT ANGIOGRAPHY CHEST WITH CONTRAST
TECHNIQUE: Multidetector CT imaging of the chest was performed using the
standard protocol during bolus administration of intravenous
contrast. Multiplanar CT image reconstructions and MIPs were
obtained to evaluate the vascular anatomy.

[Series 6: thins · axial · 0.65mm/px · z∈[-606,-347]mm · 15 of 418 slices shown]
[im 24/418  lung]
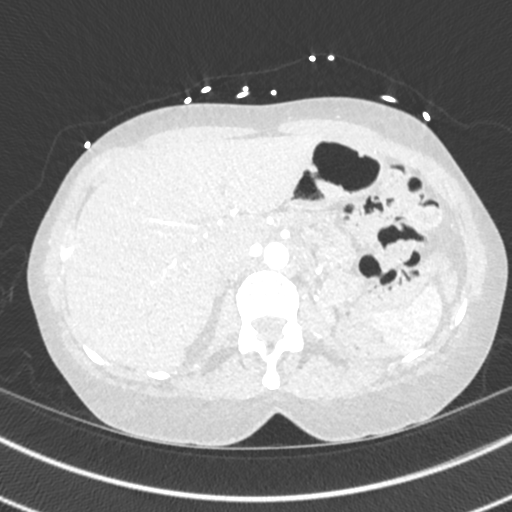
[im 47/418  soft-tissue]
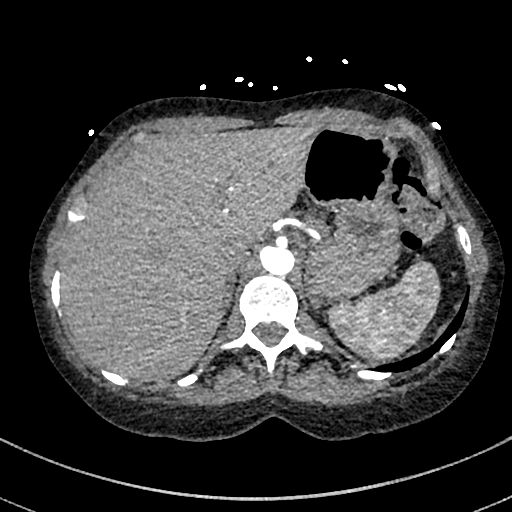
[im 70/418  lung]
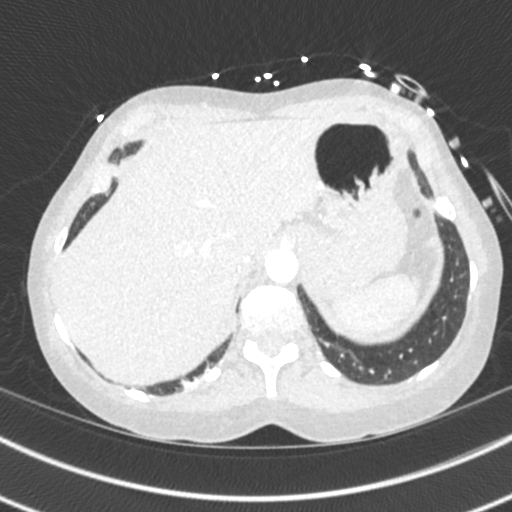
[im 93/418  soft-tissue]
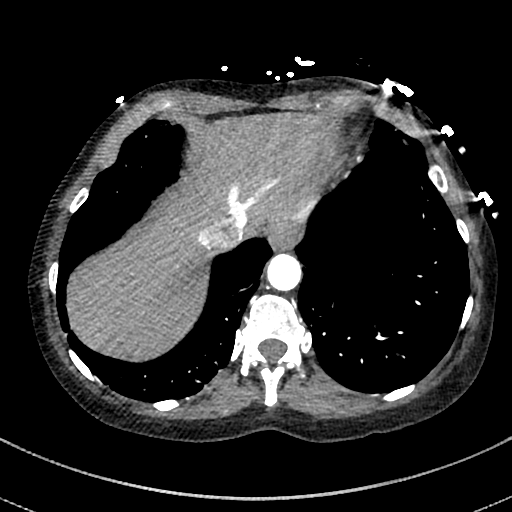
[im 140/418  lung]
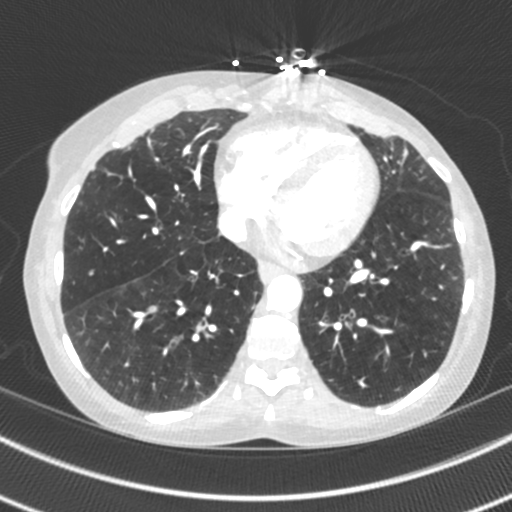
[im 163/418  soft-tissue]
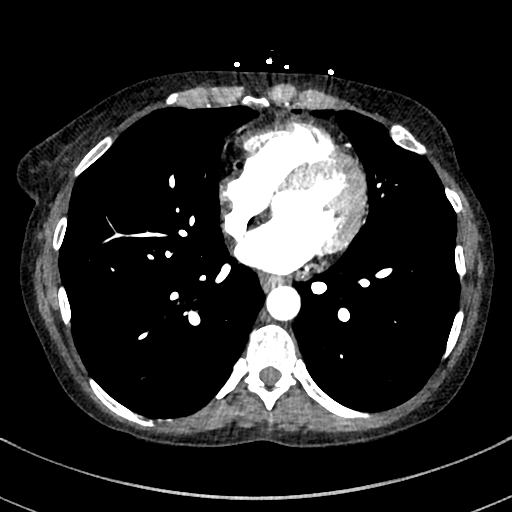
[im 186/418  lung]
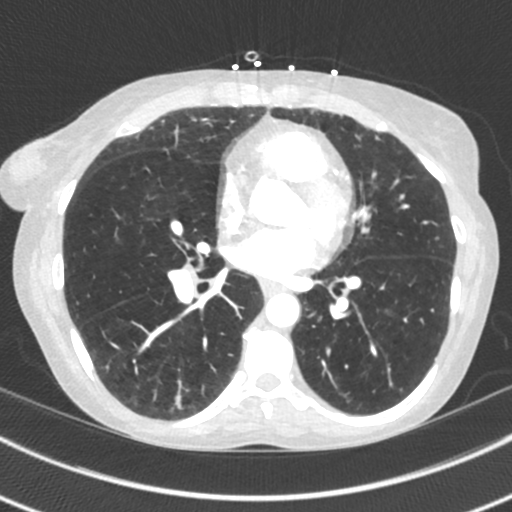
[im 209/418  soft-tissue]
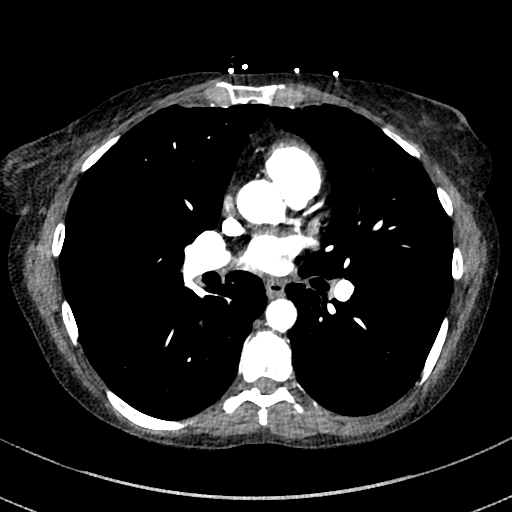
[im 232/418  lung]
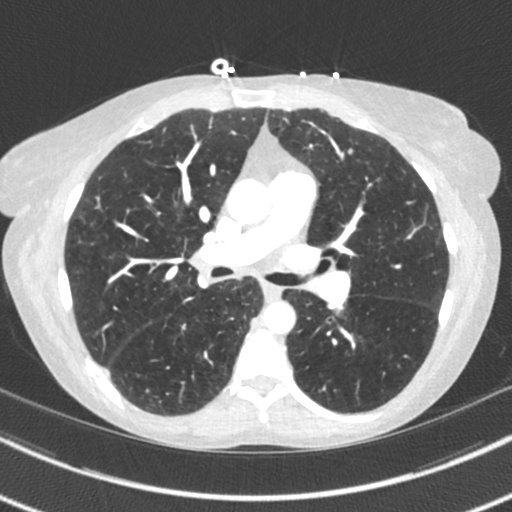
[im 255/418  soft-tissue]
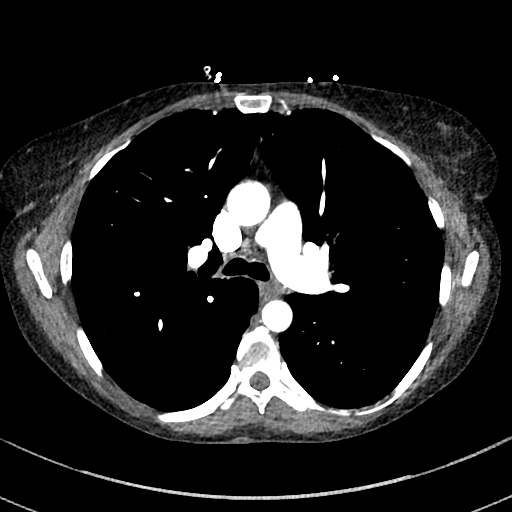
[im 279/418  lung]
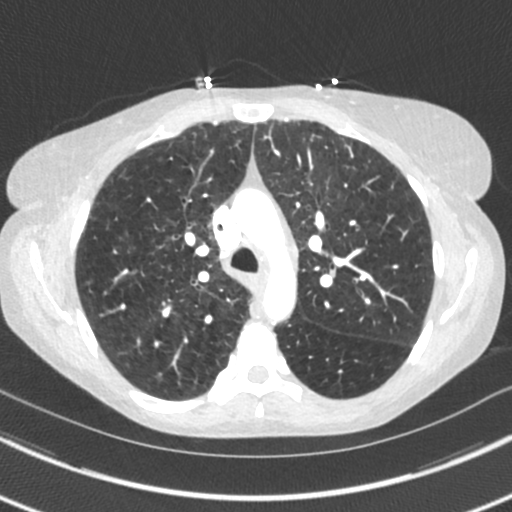
[im 325/418  soft-tissue]
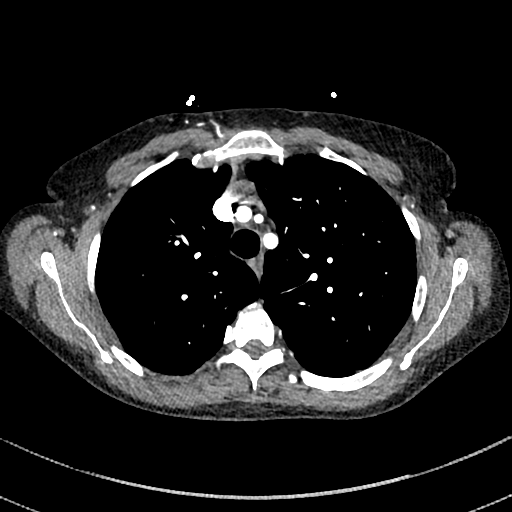
[im 348/418  lung]
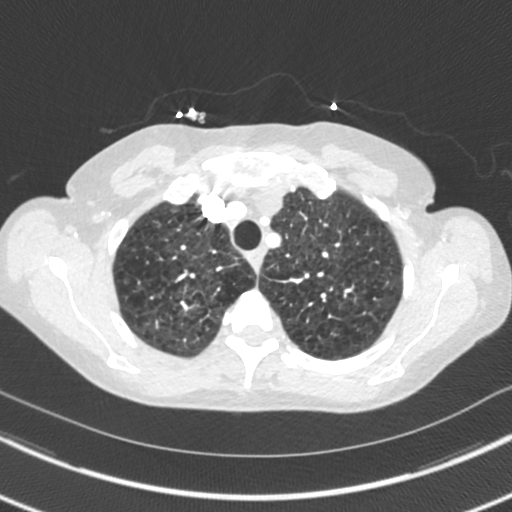
[im 371/418  soft-tissue]
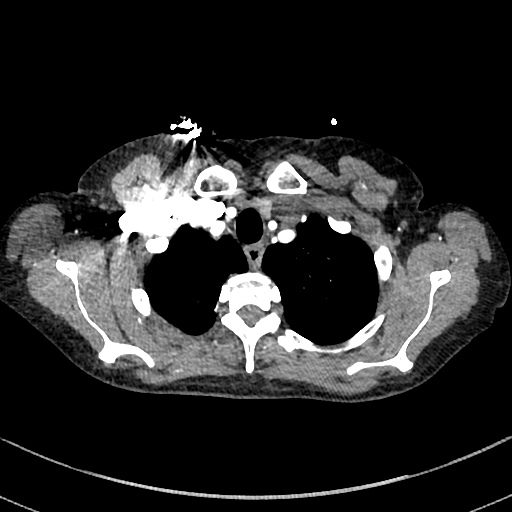
[im 394/418  lung]
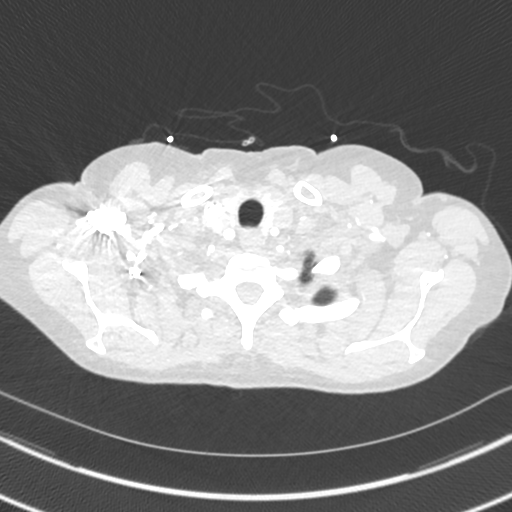

[Series 7: cor · coronal · 0.61mm/px · 3 of 128 slices shown]
[im 32/128  soft-tissue]
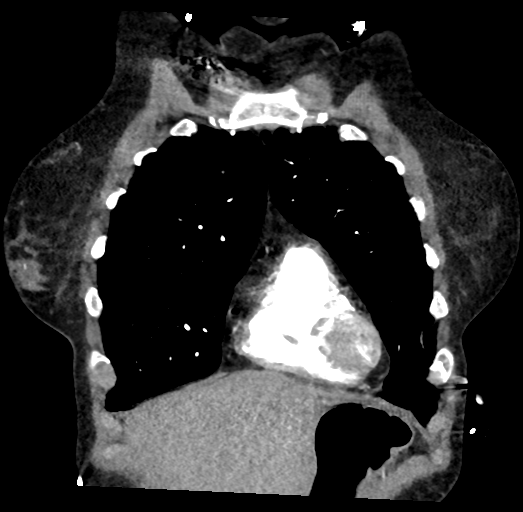
[im 64/128  soft-tissue]
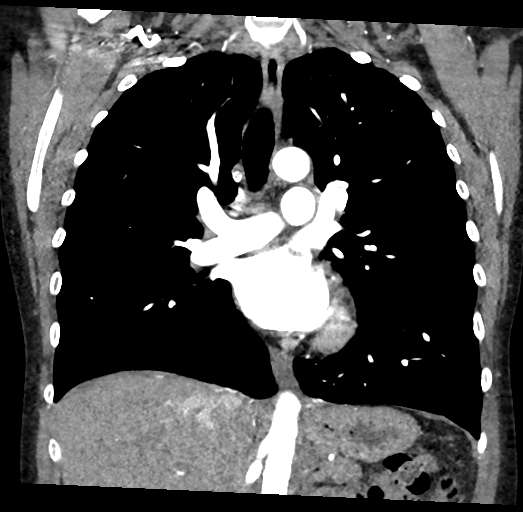
[im 96/128  soft-tissue]
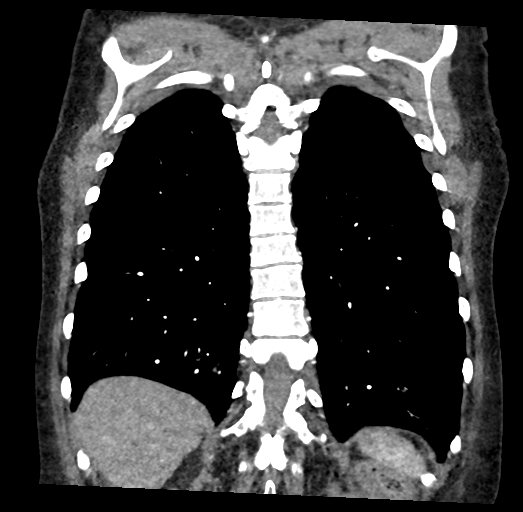

[18 of 46 positions shown; findings below may reference images not displayed]

RADIATION DOSE REDUCTION: This exam was performed according to the
departmental dose-optimization program which includes automated
exposure control, adjustment of the mA and/or kV according to
patient size and/or use of iterative reconstruction technique.

CONTRAST:  75mL OMNIPAQUE IOHEXOL 350 MG/ML SOLN
FINDINGS: Cardiovascular: Pulmonary arteries are well opacified. There is no
evidence of a pulmonary embolism.

Heart is normal in size and configuration. No pericardial effusion.
Great vessels are normal in caliber. No aortic dissection or
atherosclerosis.

Mediastinum/Nodes: No neck base, mediastinal or hilar masses or
enlarged lymph nodes. Trachea and esophagus are unremarkable.

Lungs/Pleura: Moderate to advanced emphysema. Mild bronchial wall
thickening most evident in the right lower lobe. Several areas of
peripheral mucous plugging. Peripheral interstitial thickening and
reticular opacities consistent with scarring. No evidence of
pneumonia or pulmonary edema. No lung mass or suspicious nodule.

No pleural effusion or pneumothorax.

Upper Abdomen: No acute abnormality.

Musculoskeletal: No fracture or acute finding.  No bone lesion.

Review of the MIP images confirms the above findings.
IMPRESSION: 1. No evidence of a pulmonary embolism.
2. No acute findings.
3. Moderate to advanced emphysema. Areas of bronchial wall
thickening consistent with chronic bronchitis. Several small bronchi
with mucous plugging. Chronic areas of mild interstitial thickening
and peripheral scarring.

Emphysema ([3Y]-[3Y]).

## 2022-02-22 MED ORDER — DIAZEPAM 2 MG PO TABS
2.0000 mg | ORAL_TABLET | Freq: Every day | ORAL | Status: DC | PRN
  Administered 2022-02-22 – 2022-02-25 (×3): 2 mg via ORAL
  Filled 2022-02-22 (×3): qty 1

## 2022-02-22 MED ORDER — FLUTICASONE PROPIONATE 50 MCG/ACT NA SUSP
2.0000 | Freq: Every day | NASAL | Status: DC
  Administered 2022-02-24 – 2022-02-25 (×2): 2 via NASAL
  Filled 2022-02-22: qty 16

## 2022-02-22 MED ORDER — GUAIFENESIN-DM 100-10 MG/5ML PO SYRP
5.0000 mL | ORAL_SOLUTION | ORAL | Status: DC | PRN
  Administered 2022-02-24: 5 mL via ORAL
  Filled 2022-02-22 (×2): qty 10

## 2022-02-22 MED ORDER — DOCUSATE SODIUM 100 MG PO CAPS
100.0000 mg | ORAL_CAPSULE | Freq: Two times a day (BID) | ORAL | Status: DC
  Administered 2022-02-22 – 2022-02-25 (×6): 100 mg via ORAL
  Filled 2022-02-22 (×6): qty 1

## 2022-02-22 MED ORDER — IOHEXOL 350 MG/ML SOLN
75.0000 mL | Freq: Once | INTRAVENOUS | Status: AC | PRN
  Administered 2022-02-22: 75 mL via INTRAVENOUS

## 2022-02-22 MED ORDER — ALBUTEROL SULFATE (2.5 MG/3ML) 0.083% IN NEBU
3.0000 mL | INHALATION_SOLUTION | RESPIRATORY_TRACT | Status: DC | PRN
  Administered 2022-02-24: 3 mL via RESPIRATORY_TRACT
  Filled 2022-02-22: qty 3

## 2022-02-22 MED ORDER — IPRATROPIUM-ALBUTEROL 0.5-2.5 (3) MG/3ML IN SOLN
3.0000 mL | Freq: Four times a day (QID) | RESPIRATORY_TRACT | Status: DC
  Administered 2022-02-22 – 2022-02-25 (×10): 3 mL via RESPIRATORY_TRACT
  Filled 2022-02-22 (×11): qty 3

## 2022-02-22 MED ORDER — MIRTAZAPINE 15 MG PO TABS
15.0000 mg | ORAL_TABLET | Freq: Every day | ORAL | Status: DC
  Administered 2022-02-22 – 2022-02-24 (×3): 15 mg via ORAL
  Filled 2022-02-22 (×3): qty 1

## 2022-02-22 MED ORDER — PREDNISONE 20 MG PO TABS: 40.0000 mg | ORAL_TABLET | Freq: Every day | ORAL | Status: DC

## 2022-02-22 MED ORDER — SUCRALFATE 1 GM/10ML PO SUSP
1.0000 g | Freq: Three times a day (TID) | ORAL | Status: DC
  Administered 2022-02-22 – 2022-02-25 (×10): 1 g via ORAL
  Filled 2022-02-22 (×11): qty 10

## 2022-02-22 MED ORDER — IPRATROPIUM-ALBUTEROL 0.5-2.5 (3) MG/3ML IN SOLN
3.0000 mL | Freq: Once | RESPIRATORY_TRACT | Status: AC
  Administered 2022-02-22: 3 mL via RESPIRATORY_TRACT
  Filled 2022-02-22: qty 3

## 2022-02-22 MED ORDER — ALBUTEROL SULFATE (2.5 MG/3ML) 0.083% IN NEBU: 2.5000 mg | INHALATION_SOLUTION | RESPIRATORY_TRACT | Status: DC | PRN

## 2022-02-22 MED ORDER — ALBUTEROL SULFATE (2.5 MG/3ML) 0.083% IN NEBU
2.5000 mg | INHALATION_SOLUTION | RESPIRATORY_TRACT | Status: AC
  Administered 2022-02-22: 2.5 mg via RESPIRATORY_TRACT
  Filled 2022-02-22: qty 3

## 2022-02-22 MED ORDER — CITALOPRAM HYDROBROMIDE 20 MG PO TABS
10.0000 mg | ORAL_TABLET | Freq: Every day | ORAL | Status: DC
  Administered 2022-02-23 – 2022-02-25 (×3): 10 mg via ORAL
  Filled 2022-02-22 (×3): qty 1

## 2022-02-22 MED ORDER — PANTOPRAZOLE SODIUM 40 MG IV SOLR
40.0000 mg | Freq: Two times a day (BID) | INTRAVENOUS | Status: DC
Start: 2022-02-22 — End: 2022-02-25
  Administered 2022-02-22 – 2022-02-24 (×4): 40 mg via INTRAVENOUS
  Filled 2022-02-22 (×6): qty 10

## 2022-02-22 MED ORDER — HEPARIN SODIUM (PORCINE) 5000 UNIT/ML IJ SOLN
5000.0000 [IU] | Freq: Three times a day (TID) | INTRAMUSCULAR | Status: DC
  Administered 2022-02-23 – 2022-02-25 (×7): 5000 [IU] via SUBCUTANEOUS
  Filled 2022-02-22 (×7): qty 1

## 2022-02-22 MED ORDER — CYCLOBENZAPRINE HCL 10 MG PO TABS
10.0000 mg | ORAL_TABLET | Freq: Three times a day (TID) | ORAL | Status: DC | PRN
  Administered 2022-02-22 – 2022-02-23 (×3): 10 mg via ORAL
  Filled 2022-02-22 (×3): qty 1

## 2022-02-22 MED ORDER — AZITHROMYCIN 250 MG PO TABS
500.0000 mg | ORAL_TABLET | Freq: Every day | ORAL | Status: DC
  Administered 2022-02-23 – 2022-02-24 (×2): 500 mg via ORAL
  Filled 2022-02-22 (×2): qty 2

## 2022-02-22 MED ORDER — METHYLPREDNISOLONE SODIUM SUCC 125 MG IJ SOLR
60.0000 mg | Freq: Two times a day (BID) | INTRAMUSCULAR | Status: AC
  Administered 2022-02-22 – 2022-02-23 (×2): 60 mg via INTRAVENOUS
  Filled 2022-02-22 (×2): qty 2

## 2022-02-22 MED ORDER — SODIUM CHLORIDE 0.9% FLUSH
3.0000 mL | Freq: Two times a day (BID) | INTRAVENOUS | Status: DC
  Administered 2022-02-23 – 2022-02-24 (×4): 3 mL via INTRAVENOUS

## 2022-02-22 MED ORDER — DIAZEPAM 2 MG PO TABS
2.0000 mg | ORAL_TABLET | Freq: Once | ORAL | Status: AC
  Administered 2022-02-22: 2 mg via ORAL
  Filled 2022-02-22: qty 1

## 2022-02-22 MED ORDER — BISACODYL 5 MG PO TBEC
5.0000 mg | DELAYED_RELEASE_TABLET | Freq: Every day | ORAL | Status: DC | PRN
  Administered 2022-02-23 – 2022-02-24 (×2): 5 mg via ORAL
  Filled 2022-02-22 (×2): qty 1

## 2022-02-22 MED ORDER — GUAIFENESIN ER 600 MG PO TB12
600.0000 mg | ORAL_TABLET | Freq: Two times a day (BID) | ORAL | Status: DC | PRN
  Administered 2022-02-22: 600 mg via ORAL
  Filled 2022-02-22: qty 1

## 2022-02-22 MED ORDER — HYDRALAZINE HCL 20 MG/ML IJ SOLN: 5.0000 mg | INTRAMUSCULAR | Status: DC | PRN

## 2022-02-22 MED ORDER — FLUTICASONE FUROATE-VILANTEROL 100-25 MCG/ACT IN AEPB
1.0000 | INHALATION_SPRAY | Freq: Every day | RESPIRATORY_TRACT | Status: DC
Start: 2022-02-23 — End: 2022-02-25
  Administered 2022-02-23 – 2022-02-25 (×3): 1 via RESPIRATORY_TRACT
  Filled 2022-02-22: qty 28

## 2022-02-22 MED ORDER — UMECLIDINIUM BROMIDE 62.5 MCG/ACT IN AEPB
1.0000 | INHALATION_SPRAY | Freq: Every day | RESPIRATORY_TRACT | Status: DC
  Administered 2022-02-23 – 2022-02-25 (×3): 1 via RESPIRATORY_TRACT
  Filled 2022-02-22: qty 7

## 2022-02-22 MED ORDER — NICOTINE 14 MG/24HR TD PT24
14.0000 mg | MEDICATED_PATCH | Freq: Every day | TRANSDERMAL | Status: DC
  Filled 2022-02-22 (×4): qty 1

## 2022-02-22 MED ORDER — METHYLPREDNISOLONE SODIUM SUCC 125 MG IJ SOLR
125.0000 mg | INTRAMUSCULAR | Status: AC
  Administered 2022-02-22: 125 mg via INTRAVENOUS
  Filled 2022-02-22: qty 2

## 2022-02-22 MED ORDER — SODIUM CHLORIDE 0.9 % IV SOLN
500.0000 mg | INTRAVENOUS | Status: AC
  Administered 2022-02-22: 500 mg via INTRAVENOUS
  Filled 2022-02-22: qty 5

## 2022-02-22 MED ORDER — ACETAMINOPHEN 650 MG RE SUPP: 650.0000 mg | Freq: Four times a day (QID) | RECTAL | Status: DC | PRN

## 2022-02-22 MED ORDER — QUETIAPINE FUMARATE 200 MG PO TABS
200.0000 mg | ORAL_TABLET | Freq: Every day | ORAL | Status: DC
  Administered 2022-02-22 – 2022-02-24 (×3): 200 mg via ORAL
  Filled 2022-02-22: qty 8
  Filled 2022-02-22: qty 1
  Filled 2022-02-22: qty 8

## 2022-02-22 MED ORDER — ACETAMINOPHEN 325 MG PO TABS
650.0000 mg | ORAL_TABLET | Freq: Four times a day (QID) | ORAL | Status: DC | PRN
Start: 2022-02-22 — End: 2022-02-25
  Administered 2022-02-22 – 2022-02-23 (×2): 650 mg via ORAL
  Filled 2022-02-22 (×2): qty 2

## 2022-02-22 MED ORDER — POLYETHYLENE GLYCOL 3350 17 G PO PACK: 17.0000 g | PACK | Freq: Every day | ORAL | Status: DC | PRN

## 2022-02-22 MED ORDER — LACTATED RINGERS IV SOLN: INTRAVENOUS | Status: AC

## 2022-02-22 NOTE — Assessment & Plan Note (Signed)
Continue patient on her home regimen with Celexa, Valium, Remeron. ?

## 2022-02-22 NOTE — Assessment & Plan Note (Signed)
We have continued patient on Tylenol and Flexeril per her home regimen. ?

## 2022-02-22 NOTE — ED Triage Notes (Signed)
Pt to ED via POV for shortness of breath. Pt was DC from hospital on 4/7. Pt states that last night she started having shortness of breath and pain in her left lung. Pt reports that she normally wears 2 liters of oxygen at night but today she has to turn it up to 3 liters and has been wearing it all day. Pt states that it feels like it is hard to get a good breath. Pt is currently 89-90% on 3 liters. Pt has tight congested cough.  ?

## 2022-02-22 NOTE — ED Notes (Signed)
ED Provider at bedside. 

## 2022-02-22 NOTE — Assessment & Plan Note (Signed)
Continue with nicotine patch. ? ?

## 2022-02-22 NOTE — ED Provider Notes (Signed)
? ?[facility] ?Provider Note ? ? ? Event Date/Time  ? First MD Initiated Contact with Patient 02/22/22 1642   ?  (approximate) ? ? ?History  ? ?Shortness of Breath ? ? ?HPI ? ?Colleen [name] is a 61 y.o. female who on review of discharge summary from April 7 which I have reviewed COPD with chronic respiratory failure on 2 L of oxygen at night, history of breast cancer status post radiation therapy, nicotine dependence, depression, anxiety  ? ?Patient was hospitalized discharge about a week ago after COPD exacerbation.  She reports that today she started developing shortness of breath wheezing feels like she cannot take a good breath.  She supposed to use oxygen at night only but has had to use 2 L of oxygen throughout the day today otherwise her oxygen saturations were running in the very low 90s.  Also she started noticing a sharp pain in her left upper chest when she takes of breath.  This started today looking in the left upper chest ? ?No nausea or vomiting.  No fever.  No runny nose.  She has had a slight cough as well as wheezing.  Utilize albuterol treatment 3 to 4 hours ago without improvement. ? ? ? ? ?  ? ? ?Physical Exam  ? ?Triage Vital Signs: ?ED Triage Vitals  ?Enc Vitals Group  ?   BP 02/22/22 1619 120/82  ?   Pulse Rate 02/22/22 1615 (!) 116  ?   Resp 02/22/22 1615 20  ?   Temp 02/22/22 1619 99.6 ?F (37.6 ?C)  ?   Temp Source 02/22/22 1619 Oral  ?   SpO2 02/22/22 1615 90 %  ?   Weight 02/22/22 1615 120 lb 13 oz (54.8 kg)  ?   Height 02/22/22 1615 5' (1.524 m)  ?   Head Circumference --   ?   Peak Flow --   ?   Pain Score 02/22/22 1615 10  ?   Pain Loc --   ?   Pain Edu? --   ?   Excl. in [place]? --   ? ? ?Most recent vital signs: ?Vitals:  ? 02/22/22 1815 02/22/22 1830  ?BP:    ?Pulse: (!) 107 (!) 113  ?Resp: (!) 34 (!) 23  ?Temp:    ?SpO2: 95% 96%  ? ? ? ?General: Awake, no distress she does appear just somewhat tachypneic though with mild accessory muscle use.  Oxygen saturation  90% on room air but 98% on 2 L ?CV:  Good peripheral perfusion.  Normal heart tones.  Slight tachycardia ?Resp:  Mild tachypnea mild accessory muscle use, diminished lung sounds at bases bilaterally.  Noted mild expiratory wheezing in the mid lung fields.  Poor air movement in the lower bases bilateral ?Abd:  No distention.  Soft nontender ?Other:  No lower extremity edema ?Fully alert and well-oriented ? ? ?ED Results / Procedures / Treatments  ? ?Labs ?(all labs ordered are listed, but only abnormal results are displayed) ?Labs Reviewed  ?BASIC METABOLIC PANEL - Abnormal; Notable for the following components:  ?    Result Value  ? Sodium 133 (*)   ? BUN 5 (*)   ? Calcium 8.8 (*)   ? All other components within normal limits  ?BLOOD GAS, VENOUS - Abnormal; Notable for the following components:  ? pO2, Ven 46 (*)   ? Bicarbonate 28.9 (*)   ? Acid-Base Excess 2.7 (*)   ? All other components within normal  limits  ?RESP PANEL BY RT-PCR (FLU A&B, COVID) ARPGX2  ?CBC  ?PROCALCITONIN  ?BRAIN NATRIURETIC PEPTIDE  ?TROPONIN I (HIGH SENSITIVITY)  ? ? ? ?EKG ? ?Reviewed interpreted at 1640 ?Heart rate 105 ?QRS 90 ?QTc 460 ?Sinus tachycardia no evidence of acute ischemia. ? ? ?RADIOLOGY ?DG Chest 2 View ? ?Result Date: 02/22/2022 ?CLINICAL DATA:  Shortness of breath. EXAM: CHEST - 2 VIEW COMPARISON:  02/12/22 FINDINGS: The heart size and mediastinal contours are within normal limits. No pleural effusion or edema. Both lungs are clear. Healing right posterior seventh rib fracture deformity is again noted and appears similar to previous exam. IMPRESSION: No active cardiopulmonary abnormalities. Electronically Signed   By: Kerby Moors M.D.   On: 02/22/2022 16:44  ? ?CT Angio Chest PE W and/or Wo Contrast ? ?Result Date: 02/22/2022 ?CLINICAL DATA:  Shortness of breath beginning last night with left-sided chest pain. Patient normally wears oxygen at night. EXAM: CT ANGIOGRAPHY CHEST WITH CONTRAST TECHNIQUE: Multidetector CT imaging  of the chest was performed using the standard protocol during bolus administration of intravenous contrast. Multiplanar CT image reconstructions and MIPs were obtained to evaluate the vascular anatomy. RADIATION DOSE REDUCTION: This exam was performed according to the departmental dose-optimization program which includes automated exposure control, adjustment of the mA and/or kV according to patient size and/or use of iterative reconstruction technique. CONTRAST:  31m OMNIPAQUE IOHEXOL 350 MG/ML SOLN COMPARISON:  Chest CT, 02/12/2022. FINDINGS: Cardiovascular: Pulmonary arteries are well opacified. There is no evidence of a pulmonary embolism. Heart is normal in size and configuration. No pericardial effusion. Great vessels are normal in caliber. No aortic dissection or atherosclerosis. Mediastinum/Nodes: No neck base, mediastinal or hilar masses or enlarged lymph nodes. Trachea and esophagus are unremarkable. Lungs/Pleura: Moderate to advanced emphysema. Mild bronchial wall thickening most evident in the right lower lobe. Several areas of peripheral mucous plugging. Peripheral interstitial thickening and reticular opacities consistent with scarring. No evidence of pneumonia or pulmonary edema. No lung mass or suspicious nodule. No pleural effusion or pneumothorax. Upper Abdomen: No acute abnormality. Musculoskeletal: No fracture or acute finding.  No bone lesion. Review of the MIP images confirms the above findings. IMPRESSION: 1. No evidence of a pulmonary embolism. 2. No acute findings. 3. Moderate to advanced emphysema. Areas of bronchial wall thickening consistent with chronic bronchitis. Several small bronchi with mucous plugging. Chronic areas of mild interstitial thickening and peripheral scarring. Emphysema (ICD10-J43.9). Electronically Signed   By: DLajean ManesM.D.   On: 02/22/2022 18:14   ? ? ? ? ? ?PROCEDURES: ? ?Critical Care performed: yes ? ?CRITICAL CARE ?Performed by: MDelman Kitten? ? ?Total  critical care time: 30 minutes ? ?Critical care time was exclusive of separately billable procedures and treating other patients. ? ?Critical care was necessary to treat or prevent imminent or life-threatening deterioration. ? ?Critical care was time spent personally by me on the following activities: development of treatment plan with patient and/or surrogate as well as nursing, discussions with consultants, evaluation of patient's response to treatment, examination of patient, obtaining history from patient or surrogate, ordering and performing treatments and interventions, ordering and review of laboratory studies, ordering and review of radiographic studies, pulse oximetry and re-evaluation of patient's condition. ? ?Patient with persistent COPD exacerbation requiring multiple neb treatments and admission to the hospital ? ?Procedures ? ? ?MEDICATIONS ORDERED IN ED: ?Medications  ?ipratropium-albuterol (DUONEB) 0.5-2.5 (3) MG/3ML nebulizer solution 3 mL (3 mLs Nebulization Given 02/22/22 1708)  ?ipratropium-albuterol (DUONEB) 0.5-2.5 (3) MG/3ML nebulizer  solution 3 mL (3 mLs Nebulization Given 02/22/22 1708)  ?methylPREDNISolone sodium succinate (SOLU-MEDROL) 125 mg/2 mL injection 125 mg (125 mg Intravenous Given 02/22/22 1708)  ?iohexol (OMNIPAQUE) 350 MG/ML injection 75 mL (75 mLs Intravenous Contrast Given 02/22/22 1754)  ?diazepam (VALIUM) tablet 2 mg (2 mg Oral Given 02/22/22 1837)  ?ipratropium-albuterol (DUONEB) 0.5-2.5 (3) MG/3ML nebulizer solution 3 mL (3 mLs Nebulization Given 02/22/22 1838)  ?albuterol (PROVENTIL) (2.5 MG/3ML) 0.083% nebulizer solution 2.5 mg (2.5 mg Nebulization Given 02/22/22 1838)  ? ? ? ?IMPRESSION / MDM / ASSESSMENT AND PLAN / ED COURSE  ?I reviewed the triage vital signs and the nursing notes. ?             ?               ? ?Differential diagnosis includes, but is not limited to, ACS, aortic dissection, pulmonary embolism, cardiac tamponade, pneumothorax, pneumonia, pericarditis,  myocarditis, GI-related causes including esophagitis/gastritis, and musculoskeletal chest wall pain.   ? ?Given her chest pain is rather abrupt pleuritic in nature in the left upper chest plan and associated w

## 2022-02-22 NOTE — ED Notes (Signed)
Dr. Patel at bedside 

## 2022-02-22 NOTE — ED Notes (Signed)
This RN called lab about procalcitonin and bnp addo-ons, lab acknowledges. ?

## 2022-02-22 NOTE — H&P (Signed)
?History and Physical  ? ? ?Patient: Colleen [name] BLT:[id number] DOB: [birth year] ?DOA: 02/22/2022 ?DOS: the patient was seen and examined on 02/22/2022 ?PCP: Remi Haggard, FNP  ?Patient coming from: Home ? ?Chief Complaint:  ?Chief Complaint  ?Patient presents with  ? Shortness of Breath  ? ?HPI: Colleen [name] is a 61 y.o. female with medical history significant of anxiety, PTSD, depression, COPD presenting with shortness of breath that is chronic and has been getting worse over the past few days. ?Patient states that it is her back pain that makes her very anxious and gives shortness of breath. ?Patient is otherwise a poor historian she is very anxious.  States that her pain doctor has been giving her Toradol for her low back pain as well.  She otherwise denies any NSAID use. ? ? ?Review of Systems: Review of Systems  ?Respiratory:  Positive for cough and shortness of breath.   ?Psychiatric/Behavioral:  The patient is nervous/anxious.   ?All other systems reviewed and are negative. ? ?Past Medical History:  ?Diagnosis Date  ? Anxiety   ? Asthma   ? Breast cancer ([place]) 2004  ? left breast  ? Chronic back pain   ? COPD (chronic obstructive pulmonary disease) ([place])   ? Depression   ? History of kidney surgery   ? Personal history of radiation therapy   ? PTSD (post-traumatic stress disorder)   ? ?Past Surgical History:  ?Procedure Laterality Date  ? BREAST BIOPSY Left 2004  ? positive  ? BREAST BIOPSY Right   ? neg  ? BREAST LUMPECTOMY Left 2004  ? positive  ? LEFT HEART CATH AND CORONARY ANGIOGRAPHY N/A 11/06/2021  ? Procedure: LEFT HEART CATH AND CORONARY ANGIOGRAPHY;  Surgeon: Corey Skains, MD;  Location: [facility];  Service: Cardiovascular;  Laterality: N/A;  ? MASTECTOMY Left   ? MASTECTOMY    ? NEPHRECTOMY Left   ? TUBAL LIGATION    ? ?Social History:  reports that she has been smoking cigarettes. She has been smoking an average of .5 packs per day. She has never been exposed to tobacco  smoke. She has never used smokeless tobacco. She reports that she does not drink alcohol and does not use drugs. ? ?Allergies  ?Allergen Reactions  ? Desvenlafaxine Anaphylaxis  ? Morphine And Related Anaphylaxis  ? Penicillins Anaphylaxis  ?  Has patient had a PCN reaction causing immediate rash, facial/tongue/throat swelling, SOB or lightheadedness with hypotension: Yes ?Has patient had a PCN reaction causing severe rash involving mucus membranes or skin necrosis: No ?Has patient had a PCN reaction that required hospitalization: Unknown ?Has patient had a PCN reaction occurring within the last 10 years: No ?If all of the above answers are "NO", then may proceed with Cephalosporin use. ?  ? Prazosin Other (See Comments)  ? Tamoxifen Anaphylaxis  ? Trazodone Anaphylaxis  ? Clonazepam   ? Codeine Swelling  ? Duloxetine Itching  ? Gabapentin Hives  ?  Rash and swelling.   ? Hydroxyzine Itching  ? Lorazepam   ?  Patient feels this medications makes her to sedated and wants to avoid  ? Paroxetine Hcl Hives  ? Sulfa Antibiotics Itching  ? Cephalexin Rash  ? ? ?Family History  ?Problem Relation Age of Onset  ? Breast cancer Cousin   ? Breast cancer Cousin   ? Bipolar disorder Daughter   ? Drug abuse Daughter   ? Alcohol abuse Maternal Grandfather   ? ? ?Prior to Admission medications   ?  Medication Sig Start Date End Date Taking? Authorizing Provider  ?albuterol (VENTOLIN HFA) 108 (90 Base) MCG/ACT inhaler Inhale 1-2 puffs into the lungs every 4 (four) hours as needed for cough or wheezing.    [provider]  ?cholecalciferol (VITAMIN D3) 25 MCG (1000 UNIT) tablet Take 1,000 Units by mouth daily.    [provider]  ?citalopram (CELEXA) 10 MG tablet Take 1 tablet (10 mg total) by mouth daily. 02/20/22   Ursula Alert, MD  ?cyclobenzaprine (FLEXERIL) 10 MG tablet Take 1 tablet (10 mg total) by mouth 3 (three) times daily as needed for muscle spasms. 01/15/22   Gillis Santa, MD  ?denosumab (PROLIA) 60  MG/ML SOSY injection Inject 60 mg into the skin every 6 (six) months.    [provider]  ?diazepam (VALIUM) 2 MG tablet Take 1 tablet (2 mg total) by mouth daily as needed for anxiety. For severe panic symptoms 01/29/22   Ursula Alert, MD  ?EPINEPHrine 0.3 mg/0.3 mL IJ SOAJ injection Inject 0.3 mg into the muscle as needed for anaphylaxis.    [provider]  ?fluticasone (FLONASE) 50 MCG/ACT nasal spray Place 2 sprays into both nostrils daily. 10/23/21   Pokhrel, Corrie Mckusick, MD  ?Fluticasone-Umeclidin-Vilant (TRELEGY ELLIPTA) 100-62.5-25 MCG/INH AEPB Inhale 1 puff into the lungs daily.    [provider]  ?guaiFENesin-dextromethorphan (ROBITUSSIN DM) 100-10 MG/5ML syrup Take 10 mLs by mouth every 4 (four) hours as needed for cough. 02/14/22   Lorella Nimrod, MD  ?ipratropium (ATROVENT) 0.02 % nebulizer solution Take 0.25 mg by nebulization 4 (four) times daily.    [provider]  ?mirtazapine (REMERON) 15 MG tablet Take 1 tablet (15 mg total) by mouth at bedtime. 02/20/22   Ursula Alert, MD  ?QUEtiapine (SEROQUEL) 200 MG tablet Take 1 tablet (200 mg total) by mouth at bedtime. 02/20/22   Ursula Alert, MD  ?vitamin B-12 (CYANOCOBALAMIN) 500 MCG tablet Take 500 mcg by mouth daily.    [provider]  ? ? ?Physical Exam: ?Vitals:  ? 02/22/22 1815 02/22/22 1830 02/22/22 1838 02/22/22 1930  ?BP:   115/84 102/90  ?Pulse: (!) 107 (!) 113 (!) 110 (!) 109  ?Resp: (!) 34 (!) 23 (!) 27 (!) 31  ?Temp:      ?TempSrc:      ?SpO2: 95% 96% 95% 95%  ?Weight:      ?Height:      ?Physical Exam ?Vitals and nursing note reviewed.  ?Constitutional:   ?   General: She is not in acute distress. ?   Appearance: Normal appearance. She is not ill-appearing, toxic-appearing or diaphoretic.  ?   Interventions: Nasal cannula in place.  ?HENT:  ?   Head: Normocephalic and atraumatic.  ?   Right Ear: Hearing and external ear normal.  ?   Left Ear: Hearing and external ear normal.  ?   Nose: Nose  normal. No nasal deformity.  ?   Mouth/Throat:  ?   Lips: Pink.  ?   Mouth: Mucous membranes are moist.  ?   Tongue: No lesions.  ?   Pharynx: Oropharynx is clear.  ?Eyes:  ?   Extraocular Movements: Extraocular movements intact.  ?   Pupils: Pupils are equal, round, and reactive to light.  ?Neck:  ?   Vascular: No carotid bruit.  ?Cardiovascular:  ?   Rate and Rhythm: Normal rate and regular rhythm.  ?   Pulses: Normal pulses.  ?   Heart sounds: Normal heart sounds.  ?Pulmonary:  ?  Breath sounds: Wheezing present.  ?Abdominal:  ?   General: Bowel sounds are normal. There is no distension.  ?   Palpations: Abdomen is soft. There is no mass.  ?   Tenderness: There is no abdominal tenderness. There is no guarding.  ?   Hernia: No hernia is present.  ?Musculoskeletal:  ?   Right lower leg: No edema.  ?   Left lower leg: No edema.  ?Skin: ?   General: Skin is warm.  ?Neurological:  ?   General: No focal deficit present.  ?   Mental Status: She is alert and oriented to person, place, and time.  ?   Cranial Nerves: Cranial nerves 2-12 are intact.  ?   Motor: Motor function is intact.  ?Psychiatric:     ?   Attention and Perception: She is inattentive.     ?   Mood and Affect: Mood is anxious.     ?   Speech: Speech is rapid and pressured.     ?   Behavior: Behavior normal. Behavior is cooperative.     ?   Cognition and Memory: Cognition normal.  ? ? ?Data Reviewed: ?Results for orders placed or performed during the hospital encounter of 02/22/22 (from the past 24 hour(s))  ?Basic metabolic panel     Status: Abnormal  ? Collection Time: 02/22/22  4:48 PM  ?Result Value Ref Range  ? Sodium 133 (L) 135 - 145 mmol/L  ? Potassium 3.9 3.5 - 5.1 mmol/L  ? Chloride 98 98 - 111 mmol/L  ? CO2 27 22 - 32 mmol/L  ? Glucose, Bld 98 70 - 99 mg/dL  ? BUN 5 (L) 8 - 23 mg/dL  ? Creatinine, Ser 0.48 0.44 - 1.00 mg/dL  ? Calcium 8.8 (L) 8.9 - 10.3 mg/dL  ? GFR, Estimated >60 >60 mL/min  ? Anion gap 8 5 - 15  ?CBC     Status: None  ?  Collection Time: 02/22/22  4:48 PM  ?Result Value Ref Range  ? WBC 9.9 4.0 - 10.5 K/uL  ? RBC 4.52 3.87 - 5.11 MIL/uL  ? Hemoglobin 13.4 12.0 - 15.0 g/dL  ? HCT 41.4 36.0 - 46.0 %  ? MCV 91.6 80.0 - 100.0 fL  ? MCH 29.6

## 2022-02-22 NOTE — Assessment & Plan Note (Signed)
Pt coming with sob and anxiety.  ?We will continue pt on supplemental oxygen.  ?SpO2: 95 % ?O2 Flow Rate (L/min): 3 L/min ?Prn anxiety medication as well.  ?CTA negative for PE. ? ? ?

## 2022-02-22 NOTE — Assessment & Plan Note (Signed)
Continue with azithromycin and steroid regimen. ?We will cover with ppi for empirically treating  gerd related Asthma. ?Cont prn venotlin and duoneb therapy.  ? ?

## 2022-02-23 ENCOUNTER — Encounter: Payer: Self-pay | Admitting: Internal Medicine

## 2022-02-23 DIAGNOSIS — Z79899 Other long term (current) drug therapy: Secondary | ICD-10-CM | POA: Diagnosis not present

## 2022-02-23 DIAGNOSIS — F4310 Post-traumatic stress disorder, unspecified: Secondary | ICD-10-CM | POA: Diagnosis present

## 2022-02-23 DIAGNOSIS — F411 Generalized anxiety disorder: Secondary | ICD-10-CM | POA: Diagnosis present

## 2022-02-23 DIAGNOSIS — Z885 Allergy status to narcotic agent status: Secondary | ICD-10-CM | POA: Diagnosis not present

## 2022-02-23 DIAGNOSIS — Z880 Allergy status to penicillin: Secondary | ICD-10-CM | POA: Diagnosis not present

## 2022-02-23 DIAGNOSIS — G8929 Other chronic pain: Secondary | ICD-10-CM | POA: Diagnosis present

## 2022-02-23 DIAGNOSIS — Z853 Personal history of malignant neoplasm of breast: Secondary | ICD-10-CM | POA: Diagnosis not present

## 2022-02-23 DIAGNOSIS — Z9981 Dependence on supplemental oxygen: Secondary | ICD-10-CM | POA: Diagnosis not present

## 2022-02-23 DIAGNOSIS — Z881 Allergy status to other antibiotic agents status: Secondary | ICD-10-CM | POA: Diagnosis not present

## 2022-02-23 DIAGNOSIS — M542 Cervicalgia: Secondary | ICD-10-CM | POA: Diagnosis present

## 2022-02-23 DIAGNOSIS — Z923 Personal history of irradiation: Secondary | ICD-10-CM | POA: Diagnosis not present

## 2022-02-23 DIAGNOSIS — Z888 Allergy status to other drugs, medicaments and biological substances status: Secondary | ICD-10-CM | POA: Diagnosis not present

## 2022-02-23 DIAGNOSIS — F32A Depression, unspecified: Secondary | ICD-10-CM | POA: Diagnosis present

## 2022-02-23 DIAGNOSIS — J441 Chronic obstructive pulmonary disease with (acute) exacerbation: Secondary | ICD-10-CM | POA: Diagnosis present

## 2022-02-23 DIAGNOSIS — J9621 Acute and chronic respiratory failure with hypoxia: Secondary | ICD-10-CM | POA: Diagnosis present

## 2022-02-23 DIAGNOSIS — F17210 Nicotine dependence, cigarettes, uncomplicated: Secondary | ICD-10-CM | POA: Diagnosis present

## 2022-02-23 DIAGNOSIS — Z905 Acquired absence of kidney: Secondary | ICD-10-CM | POA: Diagnosis not present

## 2022-02-23 DIAGNOSIS — Z20822 Contact with and (suspected) exposure to covid-19: Secondary | ICD-10-CM | POA: Diagnosis present

## 2022-02-23 DIAGNOSIS — E871 Hypo-osmolality and hyponatremia: Secondary | ICD-10-CM | POA: Diagnosis present

## 2022-02-23 DIAGNOSIS — R0602 Shortness of breath: Secondary | ICD-10-CM | POA: Diagnosis present

## 2022-02-23 DIAGNOSIS — Z9012 Acquired absence of left breast and nipple: Secondary | ICD-10-CM | POA: Diagnosis not present

## 2022-02-23 DIAGNOSIS — M545 Low back pain, unspecified: Secondary | ICD-10-CM | POA: Diagnosis present

## 2022-02-23 DIAGNOSIS — F410 Panic disorder [episodic paroxysmal anxiety] without agoraphobia: Secondary | ICD-10-CM | POA: Diagnosis present

## 2022-02-23 DIAGNOSIS — Z803 Family history of malignant neoplasm of breast: Secondary | ICD-10-CM | POA: Diagnosis not present

## 2022-02-23 MED ORDER — BENZONATATE 100 MG PO CAPS
100.0000 mg | ORAL_CAPSULE | Freq: Three times a day (TID) | ORAL | Status: DC
  Administered 2022-02-23 – 2022-02-25 (×7): 100 mg via ORAL
  Filled 2022-02-23 (×7): qty 1

## 2022-02-23 MED ORDER — METHYLPREDNISOLONE SODIUM SUCC 125 MG IJ SOLR
60.0000 mg | Freq: Two times a day (BID) | INTRAMUSCULAR | Status: DC
  Administered 2022-02-23 – 2022-02-24 (×2): 60 mg via INTRAVENOUS
  Filled 2022-02-23 (×2): qty 2

## 2022-02-23 NOTE — Progress Notes (Signed)
Pt tachypnic but resting comfortably, red MEWS, MD aware, PRN meds administered per MAR. ? 02/22/22 2245  ?Assess: MEWS Score  ?Temp 98.4 ?F (36.9 ?C)  ?BP 94/78  ?Pulse Rate (!) 105  ?ECG Heart Rate (!) 111  ?Resp (!) 22  ?Level of Consciousness Alert  ?SpO2 94 %  ?O2 Device Nasal Cannula  ?O2 Flow Rate (L/min) 3 L/min  ?Assess: MEWS Score  ?MEWS Temp 0  ?MEWS Systolic 1  ?MEWS Pulse 2  ?MEWS RR 1  ?MEWS LOC 0  ?MEWS Score 4  ?MEWS Score Color Red  ?Assess: if the MEWS score is Yellow or Red  ?Were vital signs taken at a resting state? Yes  ?Focused Assessment No change from prior assessment  ?Does the patient meet 2 or more of the SIRS criteria? Yes  ?Does the patient have a confirmed or suspected source of infection? No  ?MEWS guidelines implemented *See Row Information* Yes  ?Treat  ?MEWS Interventions Administered scheduled meds/treatments;Escalated (See documentation below)  ?Take Vital Signs  ?Increase Vital Sign Frequency  Red: Q 1hr X 4 then Q 4hr X 4, if remains red, continue Q 4hrs  ?Escalate  ?MEWS: Escalate Red: discuss with charge nurse/RN and provider, consider discussing with RRT  ?Notify: Charge Nurse/RN  ?Name of Charge Nurse/RN Notified Marcene Brawn, RN  ?Date Charge Nurse/RN Notified 02/22/22  ?Time Charge Nurse/RN Notified 2245  ?Notify: Provider  ?Provider Name/Title B. Randol Kern  ?Date Provider Notified 02/22/22  ?Time Provider Notified 2250  ?Notification Type Page  ?Notification Reason Other (Comment) ?(Red MEWS)  ?Provider response No new orders  ?Date of Provider Response 02/22/22  ?Time of Provider Response 2255  ?Assess: SIRS CRITERIA  ?SIRS Temperature  0  ?SIRS Pulse 1  ?SIRS Respirations  1  ?SIRS WBC 0  ?SIRS Score Sum  2  ? ? ?

## 2022-02-23 NOTE — Evaluation (Signed)
Physical Therapy Evaluation ?Patient Details ?Name: Colleen [name] ?MRN: [id number] ?DOB: 1961/02/05 ?Today's Date: 02/23/2022 ? ?History of Present Illness ? Colleen [name] is a 61 y.o. female with medical history significant of anxiety, PTSD, depression, COPD presenting with shortness of breath that is chronic and has been getting worse over the past few days.  Patient states that it is her back pain that makes her very anxious and gives shortness of breath. ?  ?Clinical Impression ? Patient received in bed, she is slightly lethargic, yet anxious. She reports she had pain medicine and it makes her groggy. She reports 10/10 back pain. Patient is on 2 liters of oxygen at baseline only at night per her report. She is on 3 liters currently with saturations in mid 90%s. She is independent with bed mobility, transfers with min guard and ambulated around the room without AD with min guard. No lob or significant difficulty noted. She does tend to reach out for furniture and reports she uses cane at baseline.  Patient will continue to benefit from skilled PT while here to improve functional mobility and ensure safety for return home.   ?   ? ?Recommendations for follow up therapy are one component of a multi-disciplinary discharge planning process, led by the attending physician.  Recommendations may be updated based on patient status, additional functional criteria and insurance authorization. ? ?Follow Up Recommendations No PT follow up ? ?  ?Assistance Recommended at Discharge None  ?Patient can return home with the following ? A little help with walking and/or transfers;Help with stairs or ramp for entrance;Assist for transportation;Assistance with cooking/housework ? ?  ?Equipment Recommendations None recommended by PT  ?Recommendations for Other Services ?    ?  ?Functional Status Assessment Patient has had a recent decline in their functional status and demonstrates the ability to make significant improvements in  function in a reasonable and predictable amount of time.  ? ?  ?Precautions / Restrictions Precautions ?Precautions: Fall ?Restrictions ?Weight Bearing Restrictions: No  ? ?  ? ?Mobility ? Bed Mobility ?Overal bed mobility: Independent ?  ?  ?  ?  ?  ?  ?  ?  ? ?Transfers ?Overall transfer level: Modified independent ?Equipment used: None ?  ?  ?  ?  ?  ?  ?  ?  ?  ? ?Ambulation/Gait ?Ambulation/Gait assistance: Supervision ?Gait Distance (Feet): 35 Feet ?Assistive device: None ?Gait Pattern/deviations: WFL(Within Functional Limits) ?Gait velocity: WFL ?  ?  ?General Gait Details: patient ambulated around room, turning 2-3 times, no lob, no significant difficulty. She does reach out for support from furniture. ? ?Stairs ?  ?  ?  ?  ?  ? ?Wheelchair Mobility ?  ? ?Modified Rankin (Stroke Patients Only) ?  ? ?  ? ?Balance Overall balance assessment: Modified Independent ?  ?  ?  ?  ?  ?  ?  ?  ?  ?  ?  ?  ?  ?  ?  ?  ?  ?  ?   ? ? ? ?Pertinent Vitals/Pain Pain Assessment ?Pain Assessment: 0-10 ?Pain Score: 10-Worst pain ever ?Pain Location: back- reports significant back pain, however mobility does not reflect severe back pain ?Pain Descriptors / Indicators: Discomfort, Pressure, Pounding ?Pain Intervention(s): Repositioned, Monitored during session  ? ? ?Home Living Family/patient expects to be discharged to:: Private residence ?Living Arrangements: Spouse/significant other ?Available Help at Discharge: Family;Available 24 hours/day ?Type of Home: House ?Home Access: Stairs to enter ?Entrance Stairs-Rails: Right;Left ?  Entrance Stairs-Number of Steps: 5 ?  ?Home Layout: One level ?Home Equipment: Kasandra Knudsen - single point ?   ?  ?Prior Function Prior Level of Function : Independent/Modified Independent ?  ?  ?  ?  ?  ?  ?Mobility Comments: No history of falls, uses cane at baseline ?ADLs Comments: SBA from spouse getting in/out of tub secondary to fear of falling ?  ? ? ?Hand Dominance  ? Dominant Hand: Right ? ?   ?Extremity/Trunk Assessment  ? Upper Extremity Assessment ?Upper Extremity Assessment: Defer to OT evaluation ?  ? ?Lower Extremity Assessment ?Lower Extremity Assessment: Overall WFL for tasks assessed ?  ? ?Cervical / Trunk Assessment ?Cervical / Trunk Assessment: Normal  ?Communication  ? Communication: No difficulties;Other (comment) (tangential at times, anxious)  ?Cognition Arousal/Alertness: Awake/alert ?Behavior During Therapy: [facility] for tasks assessed/performed, Anxious ?Overall Cognitive Status: Within Functional Limits for tasks assessed ?  ?  ?  ?  ?  ?  ?  ?  ?  ?  ?  ?  ?  ?  ?  ?  ?  ?  ?  ? ?  ?General Comments   ? ?  ?Exercises    ? ?Assessment/Plan  ?  ?PT Assessment Patient needs continued PT services  ?PT Problem List Pain;Decreased mobility;Decreased activity tolerance ? ?   ?  ?PT Treatment Interventions DME instruction;Gait training;Functional mobility training;Therapeutic activities;Patient/family education;Stair training   ? ?PT Goals (Current goals can be found in the Care Plan section)  ?Acute Rehab PT Goals ?Patient Stated Goal: to reduce back pain ?PT Goal Formulation: With patient ?Time For Goal Achievement: 03/02/22 ?Potential to Achieve Goals: Fair ? ?  ?Frequency Min 2X/week ?  ? ? ?Co-evaluation   ?  ?  ?  ?  ? ? ?  ?AM-PAC PT "6 Clicks" Mobility  ?Outcome Measure Help needed turning from your back to your side while in a flat bed without using bedrails?: None ?Help needed moving from lying on your back to sitting on the side of a flat bed without using bedrails?: None ?Help needed moving to and from a bed to a chair (including a wheelchair)?: None ?Help needed standing up from a chair using your arms (e.g., wheelchair or bedside chair)?: A Little ?Help needed to walk in hospital room?: A Little ?Help needed climbing 3-5 steps with a railing? : A Little ?6 Click Score: 21 ? ?  ?End of Session Equipment Utilized During Treatment: Oxygen ?Activity Tolerance: Patient tolerated treatment  well ?Patient left: in bed;with call bell/phone within reach ?Nurse Communication: Mobility status;Other (comment) (patient received in bed without bed alarm donned. Patient left in bed with bed alarm off, RN notified.) ?PT Visit Diagnosis: Difficulty in walking, not elsewhere classified (R26.2);Pain ?Pain - part of body:  (lumbar) ?  ? ?Time: 4097-3532 ?PT Time Calculation (min) (ACUTE ONLY): 13 min ? ? ?Charges:   PT Evaluation ?$PT Eval Low Complexity: 1 Low ?  ?  ?   ? ? ?[employer], PT, [employer] ?02/23/22,10:40 AM ? ? ?

## 2022-02-23 NOTE — Progress Notes (Addendum)
Progress Note    Colleen [name]  UXL:[id number] DOB: 09/02/1961  DOA: 02/22/2022 PCP: Armando Gang, FNP      Brief Narrative:    Medical records reviewed and are as summarized below:  Colleen [name] is a 61 y.o. female with medical history significant for COPD, chronic hypoxic respiratory failure on nocturnal oxygen 2 L/min as needed, PTSD, chronic neck and back pain, asthma, anxiety, history of breast cancer with radiation therapy, who presented to the hospital because of increasing shortness of breath and cough.       Assessment/Plan:   Principal Problem:   SOB (shortness of breath) Active Problems:   Acute exacerbation of chronic obstructive pulmonary disease (COPD) (HCC)   GAD (generalized anxiety disorder)   Tobacco use disorder   Low back pain   Body mass index is 23.59 kg/m.   COPD exacerbation: Continue IV steroids, bronchodilators and azithromycin.  Add Tessalon Perles for cough and incentive spirometer.  Acute on chronic hypoxic respiratory failure: She uses oxygen at night at home but she has been requiring 3 L/min oxygen since her symptoms started.  Oxygen saturation was 89 to 90% on 3 L/min  Mild hyponatremia: Asymptomatic.  Chronic neck and low back pain: Continue analgesics as needed for pain  Anxiety, depression, PTSD: Continue Celexa, mirtazapine and Seroquel   Diet Order             Diet Heart Room service appropriate? Yes; Fluid consistency: Thin  Diet effective now                         Consultants: None  Procedures: None    Medications:    azithromycin  500 mg Oral Daily   citalopram  10 mg Oral Daily   docusate sodium  100 mg Oral BID   fluticasone  2 spray Each Nare Daily   fluticasone furoate-vilanterol  1 puff Inhalation Daily   And   umeclidinium bromide  1 puff Inhalation Daily   heparin  5,000 Units Subcutaneous Q8H   ipratropium-albuterol  3 mL Nebulization Q6H   mirtazapine  15 mg Oral QHS    nicotine  14 mg Transdermal Daily   pantoprazole (PROTONIX) IV  40 mg Intravenous Q12H   [START ON 02/24/2022] predniSONE  40 mg Oral Q breakfast   QUEtiapine  200 mg Oral QHS   sodium chloride flush  3 mL Intravenous Q12H   sucralfate  1 g Oral TID WC & HS   Continuous Infusions:   Anti-infectives (From admission, onward)    Start     Dose/Rate Route Frequency Ordered Stop   02/23/22 1015  azithromycin (ZITHROMAX) tablet 500 mg       See Hyperspace for full Linked Orders Report.   500 mg Oral Daily 02/22/22 1923 02/27/22 0959   02/22/22 1945  azithromycin (ZITHROMAX) 500 mg in sodium chloride 0.9 % 250 mL IVPB       See Hyperspace for full Linked Orders Report.   500 mg 250 mL/hr over 60 Minutes Intravenous Every 24 hours 02/22/22 1923 02/22/22 2140              Family Communication/Anticipated D/C date and plan/Code Status   DVT prophylaxis: heparin injection 5,000 Units Start: 02/23/22 0600     Code Status: Full Code  Family Communication: None Disposition Plan: Plan to discharge home tomorrow   Status is: Observation The patient will require care spanning > 2 midnights and should be  moved to inpatient because: COPD exacerbation       Subjective:   Interval events noted.  She complains of cough,  chronic neck and low back.  She still has trouble breathing although it is a little better.  Objective:    Vitals:   02/23/22 0151 02/23/22 0314 02/23/22 0600 02/23/22 0837  BP:  92/66 95/69 100/70  Pulse: 92 93 89 (!) 105  Resp:  20 15 18   Temp:  97.9 F (36.6 C) 98.5 F (36.9 C) 97.7 F (36.5 C)  TempSrc:  Oral Oral   SpO2: 95% 95% 95% 98%  Weight:      Height:       No data found.   Intake/Output Summary (Last 24 hours) at 02/23/2022 0925 Last data filed at 02/23/2022 0400 Gross per 24 hour  Intake 411.67 ml  Output --  Net 411.67 ml   Filed Weights   02/22/22 1615  Weight: 54.8 kg    Exam:  GEN: NAD SKIN: No rash. Skin tag on  midback EYES: EOMI ENT: MMM CV: RRR PULM: Decreased air entry bilaterally, b/l wheezing ABD: soft, ND, NT, +BS CNS: AAO x 3, non focal EXT: No edema or tenderness        Data Reviewed:   I have personally reviewed following labs and imaging studies:  Labs: Labs show the following:   Basic Metabolic Panel: Recent Labs  Lab 02/22/22 1648  NA 133*  K 3.9  CL 98  CO2 27  GLUCOSE 98  BUN 5*  CREATININE 0.48  CALCIUM 8.8*   GFR Estimated Creatinine Clearance: 57.4 mL/min (by C-G formula based on SCr of 0.48 mg/dL). Liver Function Tests: No results for input(s): AST, ALT, ALKPHOS, BILITOT, PROT, ALBUMIN in the last 168 hours. No results for input(s): LIPASE, AMYLASE in the last 168 hours. No results for input(s): AMMONIA in the last 168 hours. Coagulation profile No results for input(s): INR, PROTIME in the last 168 hours.  CBC: Recent Labs  Lab 02/22/22 1648  WBC 9.9  HGB 13.4  HCT 41.4  MCV 91.6  PLT 309   Cardiac Enzymes: No results for input(s): CKTOTAL, CKMB, CKMBINDEX, TROPONINI in the last 168 hours. BNP (last 3 results) No results for input(s): PROBNP in the last 8760 hours. CBG: No results for input(s): GLUCAP in the last 168 hours. D-Dimer: No results for input(s): DDIMER in the last 72 hours. Hgb A1c: No results for input(s): HGBA1C in the last 72 hours. Lipid Profile: No results for input(s): CHOL, HDL, LDLCALC, TRIG, CHOLHDL, LDLDIRECT in the last 72 hours. Thyroid function studies: Recent Labs    02/22/22 1900  TSH 1.074   Anemia work up: No results for input(s): VITAMINB12, FOLATE, FERRITIN, TIBC, IRON, RETICCTPCT in the last 72 hours. Sepsis Labs: Recent Labs  Lab 02/22/22 1648 02/22/22 1842  PROCALCITON  --  <0.10  WBC 9.9  --     Microbiology Recent Results (from the past 240 hour(s))  Resp Panel by RT-PCR (Flu A&B, Covid) Nasopharyngeal Swab     Status: None   Collection Time: 02/22/22  4:48 PM   Specimen:  Nasopharyngeal Swab; Nasopharyngeal(NP) swabs in vial transport medium  Result Value Ref Range Status   SARS Coronavirus 2 by RT PCR NEGATIVE NEGATIVE Final    Comment: (NOTE) SARS-CoV-2 target nucleic acids are NOT DETECTED.  The SARS-CoV-2 RNA is generally detectable in upper respiratory specimens during the acute phase of infection. The lowest concentration of SARS-CoV-2 viral copies this assay can  detect is 138 copies/mL. A negative result does not preclude SARS-Cov-2 infection and should not be used as the sole basis for treatment or other patient management decisions. A negative result may occur with  improper specimen collection/handling, submission of specimen other than nasopharyngeal swab, presence of viral mutation(s) within the areas targeted by this assay, and inadequate number of viral copies(<138 copies/mL). A negative result must be combined with clinical observations, patient history, and epidemiological information. The expected result is Negative.  Fact Sheet for Patients:  BloggerCourse.com  Fact Sheet for Healthcare Providers:  SeriousBroker.it  This test is no t yet approved or cleared by the [place] FDA and  has been authorized for detection and/or diagnosis of SARS-CoV-2 by FDA under an Emergency Use Authorization (EUA). This EUA will remain  in effect (meaning this test can be used) for the duration of the COVID-19 declaration under Section 564(b)(1) of the Act, 21 U.S.C.section 360bbb-3(b)(1), unless the authorization is terminated  or revoked sooner.       Influenza A by PCR NEGATIVE NEGATIVE Final   Influenza B by PCR NEGATIVE NEGATIVE Final    Comment: (NOTE) The Xpert Xpress SARS-CoV-2/FLU/RSV plus assay is intended as an aid in the diagnosis of influenza from Nasopharyngeal swab specimens and should not be used as a sole basis for treatment. Nasal washings and aspirates are unacceptable for  Xpert Xpress SARS-CoV-2/FLU/RSV testing.  Fact Sheet for Patients: BloggerCourse.com  Fact Sheet for Healthcare Providers: SeriousBroker.it  This test is not yet approved or cleared by the [place] FDA and has been authorized for detection and/or diagnosis of SARS-CoV-2 by FDA under an Emergency Use Authorization (EUA). This EUA will remain in effect (meaning this test can be used) for the duration of the COVID-19 declaration under Section 564(b)(1) of the Act, 21 U.S.C. section 360bbb-3(b)(1), unless the authorization is terminated or revoked.  Performed at [facility], [address] [place], [place] [address]     Procedures and diagnostic studies:  DG Chest 2 View  Result Date: 02/22/2022 CLINICAL DATA:  Shortness of breath. EXAM: CHEST - 2 VIEW COMPARISON:  02/12/22 FINDINGS: The heart size and mediastinal contours are within normal limits. No pleural effusion or edema. Both lungs are clear. Healing right posterior seventh rib fracture deformity is again noted and appears similar to previous exam. IMPRESSION: No active cardiopulmonary abnormalities. Electronically Signed   By: Signa Kell M.D.   On: 02/22/2022 16:44   CT Angio Chest PE W and/or Wo Contrast  Result Date: 02/22/2022 CLINICAL DATA:  Shortness of breath beginning last night with left-sided chest pain. Patient normally wears oxygen at night. EXAM: CT ANGIOGRAPHY CHEST WITH CONTRAST TECHNIQUE: Multidetector CT imaging of the chest was performed using the standard protocol during bolus administration of intravenous contrast. Multiplanar CT image reconstructions and MIPs were obtained to evaluate the vascular anatomy. RADIATION DOSE REDUCTION: This exam was performed according to the departmental dose-optimization program which includes automated exposure control, adjustment of the mA and/or kV according to patient size and/or use of iterative reconstruction  technique. CONTRAST:  75mL OMNIPAQUE IOHEXOL 350 MG/ML SOLN COMPARISON:  Chest CT, 02/12/2022. FINDINGS: Cardiovascular: Pulmonary arteries are well opacified. There is no evidence of a pulmonary embolism. Heart is normal in size and configuration. No pericardial effusion. Great vessels are normal in caliber. No aortic dissection or atherosclerosis. Mediastinum/Nodes: No neck base, mediastinal or hilar masses or enlarged lymph nodes. Trachea and esophagus are unremarkable. Lungs/Pleura: Moderate to advanced emphysema. Mild bronchial wall thickening  most evident in the right lower lobe. Several areas of peripheral mucous plugging. Peripheral interstitial thickening and reticular opacities consistent with scarring. No evidence of pneumonia or pulmonary edema. No lung mass or suspicious nodule. No pleural effusion or pneumothorax. Upper Abdomen: No acute abnormality. Musculoskeletal: No fracture or acute finding.  No bone lesion. Review of the MIP images confirms the above findings. IMPRESSION: 1. No evidence of a pulmonary embolism. 2. No acute findings. 3. Moderate to advanced emphysema. Areas of bronchial wall thickening consistent with chronic bronchitis. Several small bronchi with mucous plugging. Chronic areas of mild interstitial thickening and peripheral scarring. Emphysema (ICD10-J43.9). Electronically Signed   By: Amie Portland M.D.   On: 02/22/2022 18:14               LOS: 0 days   Astella Desir  [facility]   Pager on [email or web address]. If 7PM-7AM, please contact night-coverage at [email or web address]     02/23/2022, 9:25 AM

## 2022-02-23 NOTE — Evaluation (Signed)
Occupational Therapy Evaluation ?Patient Details ?Name: Colleen [name] ?MRN: [id number] ?DOB: 27-Sep-1961 ?Today's Date: 02/23/2022 ? ? ?History of Present Illness Colleen [name] is a 61 y.o. female with medical history significant of anxiety, PTSD, depression, COPD presenting with shortness of breath that is chronic and has been getting worse over the past few days.  Patient states that it is her back pain that makes her very anxious and gives shortness of breath.  ? ?Clinical Impression ?  ?Ms. Hou was seen for OT-PT Co-evaluation this date. Pt was independent in all ADL and functional mobility, living in a 1 story home with her spouse available 24/7. Pt on 2 liters of O2 at night at home but RA during the day. Pt reports becoming easily fatigued or out of breath with minimal exertion. Pt currently requires SUPERVISION assist for exertional ADL management including LB bathing and dressing due to current functional impairments (See OT Problem List below). Pt would benefit from additional skilled OT services for additional education and energy conservation strategies and maximize recall and carryover of learned techniques and facilitate implementation of learned techniques into daily routines. Do not anticipate the need for OT services upon hospital DC.   ? ?  ?   ? ?Recommendations for follow up therapy are one component of a multi-disciplinary discharge planning process, led by the attending physician.  Recommendations may be updated based on patient status, additional functional criteria and insurance authorization.  ? ?Follow Up Recommendations ? No OT follow up  ?  ?Assistance Recommended at Discharge PRN  ?Patient can return home with the following   ? ?  ?Functional Status Assessment ? Patient has had a recent decline in their functional status and demonstrates the ability to make significant improvements in function in a reasonable and predictable amount of time.  ?Equipment Recommendations ? None recommended  by OT  ?  ?Recommendations for Other Services   ? ? ?  ?Precautions / Restrictions Precautions ?Precautions: Fall ?Restrictions ?Weight Bearing Restrictions: No  ? ?  ? ?Mobility Bed Mobility ?Overal bed mobility: Independent ?  ?  ?  ?  ?  ?  ?  ?  ? ?Transfers ?Overall transfer level: Modified independent ?Equipment used: None ?  ?  ?  ?  ?  ?  ?  ?  ?  ? ?  ?Balance Overall balance assessment: Modified Independent, No apparent balance deficits (not formally assessed) ?  ?  ?  ?  ?  ?  ?  ?  ?  ?  ?  ?  ?  ?  ?  ?  ?  ?  ?   ? ?ADL either performed or assessed with clinical judgement  ? ?ADL Overall ADL's : Needs assistance/impaired ?  ?  ?  ?  ?  ?  ?  ?  ?  ?  ?  ?  ?  ?  ?  ?  ?  ?  ?  ?General ADL Comments: Pt presents near baseline level of functional independence. She is noted with increased SOB and decreased activity tolerance during session but is able to complete bed/functional mobility and LB dressing with MOD I. Pt would benefit from education on energy conservation strategies to maximize safety and functional independence during ADL management.  ? ? ? ?Vision Patient Visual Report: No change from baseline ?   ?   ?Perception   ?  ?Praxis   ?  ? ?Pertinent Vitals/Pain Pain Assessment ?Pain Assessment: 0-10 ?Pain Score:  10-Worst pain ever ?Pain Location: back- reports significant back pain, however mobility does not reflect severe back pain ?Pain Descriptors / Indicators: Discomfort, Pressure, Pounding ?Pain Intervention(s): Monitored during session, Repositioned  ? ? ? ?Hand Dominance Right ?  ?Extremity/Trunk Assessment Upper Extremity Assessment ?Upper Extremity Assessment: Overall WFL for tasks assessed ?  ?Lower Extremity Assessment ?Lower Extremity Assessment: Overall WFL for tasks assessed ?  ?Cervical / Trunk Assessment ?Cervical / Trunk Assessment: Normal ?  ?Communication Communication ?Communication: No difficulties;Other (comment) ?  ?Cognition Arousal/Alertness: Awake/alert ?Behavior During  Therapy: [facility] for tasks assessed/performed, Anxious ?Overall Cognitive Status: Within Functional Limits for tasks assessed ?  ?  ?  ?  ?  ?  ?  ?  ?  ?  ?  ?  ?  ?  ?  ?  ?  ?  ?  ?General Comments  VSS t/o session. Pt spO2 remains >/= 93% during session with pt on 3L [place]. ? ?  ?Exercises   ?  ?Shoulder Instructions    ? ? ?Home Living Family/patient expects to be discharged to:: Private residence ?Living Arrangements: Spouse/significant other ?Available Help at Discharge: Family;Available 24 hours/day ?Type of Home: House ?Home Access: Stairs to enter ?Entrance Stairs-Number of Steps: 5 ?Entrance Stairs-Rails: Right;Left ?Home Layout: One level ?  ?  ?Bathroom Shower/Tub: Tub/shower unit ?  ?Bathroom Toilet: Standard ?  ?  ?Home Equipment: Kasandra Knudsen - single point ?  ?  ?  ? ?  ?Prior Functioning/Environment Prior Level of Function : Independent/Modified Independent ?  ?  ?  ?  ?  ?  ?Mobility Comments: No history of falls, uses cane at baseline ?ADLs Comments: SBA from spouse getting in/out of tub secondary to fear of falling ?  ? ?  ?  ?OT Problem List: Cardiopulmonary status limiting activity;Decreased activity tolerance;Decreased safety awareness;Decreased knowledge of use of DME or AE ?  ?   ?OT Treatment/Interventions: Self-care/ADL training;Therapeutic exercise;Therapeutic activities;DME and/or AE instruction;Energy conservation;Patient/family education  ?  ?OT Goals(Current goals can be found in the care plan section) Acute Rehab OT Goals ?Patient Stated Goal: To have less pain. ?OT Goal Formulation: With patient ?Time For Goal Achievement: 03/09/22 ?Potential to Achieve Goals: Good  ?OT Frequency: Min 2X/week ?  ? ?Co-evaluation   ?  ?  ?  ?  ? ?  ?AM-PAC OT "6 Clicks" Daily Activity     ?Outcome Measure Help from another person eating meals?: None ?Help from another person taking care of personal grooming?: None ?Help from another person toileting, which includes using toliet, bedpan, or urinal?: None ?Help from  another person bathing (including washing, rinsing, drying)?: A Little ?Help from another person to put on and taking off regular upper body clothing?: None ?Help from another person to put on and taking off regular lower body clothing?: A Little ?6 Click Score: 22 ?  ?End of Session Equipment Utilized During Treatment: Gait belt;Rolling walker (2 wheels) ?Nurse Communication: Mobility status ? ?Activity Tolerance: Patient tolerated treatment well ?Patient left: in bed;with call bell/phone within reach;Other (comment) (Pt recieved with no bed alarm set. RN notified pt left without bed alarm.) ? ?OT Visit Diagnosis: Other abnormalities of gait and mobility (R26.89)  ?              ?Time: 0947-0962 ?OT Time Calculation (min): 13 min ?Charges:  OT General Charges ?$OT Visit: 1 Visit ?OT Evaluation ?$OT Eval Low Complexity: 1 Low ? ?Shara Blazing, M.S., OTR/L ?[facility] [place]  [facility] ?Ascom: [phone] ?02/23/22, 12:52 PM ? ?

## 2022-02-24 DIAGNOSIS — J441 Chronic obstructive pulmonary disease with (acute) exacerbation: Secondary | ICD-10-CM | POA: Diagnosis not present

## 2022-02-24 MED ORDER — PREDNISONE 20 MG PO TABS
40.0000 mg | ORAL_TABLET | Freq: Every day | ORAL | Status: DC
  Administered 2022-02-25: 40 mg via ORAL
  Filled 2022-02-24: qty 2

## 2022-02-24 MED ORDER — ENSURE ENLIVE PO LIQD
237.0000 mL | Freq: Two times a day (BID) | ORAL | Status: DC
  Administered 2022-02-25: 237 mL via ORAL

## 2022-02-24 MED ORDER — ADULT MULTIVITAMIN W/MINERALS CH
1.0000 | ORAL_TABLET | Freq: Every day | ORAL | Status: DC
  Administered 2022-02-24 – 2022-02-25 (×2): 1 via ORAL
  Filled 2022-02-24: qty 1

## 2022-02-24 MED ORDER — PREDNISONE 20 MG PO TABS: 40.0000 mg | ORAL_TABLET | Freq: Every day | ORAL | 0 refills | Status: AC

## 2022-02-24 NOTE — Plan of Care (Signed)

## 2022-02-24 NOTE — Progress Notes (Addendum)
? ? ? ?Progress Note  ? ? Colleen [name]  EXH:[id number] DOB: June 23, 1961  DOA: 02/22/2022 ?PCP: Remi Haggard, FNP  ? ? ? ? ?Brief Narrative:  ? ? ?Medical records reviewed and are as summarized below: ? ?Colleen [name] is a 61 y.o. female with medical history significant for COPD, chronic hypoxic respiratory failure on nocturnal oxygen 2 L/min as needed, PTSD, chronic neck and back pain, asthma, anxiety, history of breast cancer with radiation therapy, who presented to the hospital because of increasing shortness of breath and cough.  ? ? ? ? ? ?Assessment/Plan:  ? ?Principal Problem: ?  Acute exacerbation of chronic obstructive pulmonary disease (COPD) ([place]) ?Active Problems: ?  SOB (shortness of breath) ?  GAD (generalized anxiety disorder) ?  Tobacco use disorder ?  Low back pain ? ? ?Body mass index is 23.59 kg/m?. ? ? ?COPD exacerbation: Continue steroids, bronchodilators.  Discontinue azithromycin. ? ?Acute on chronic hypoxic respiratory failure: She is down to 2 L/min oxygen.  She uses oxygen at night at home.  ? ?Mild hyponatremia: Asymptomatic. ? ?Chronic neck and low back pain: Continue analgesics as needed for pain ? ?Anxiety, depression, PTSD: Continue Celexa, mirtazapine and Seroquel ? ?Tobacco use disorder: Counseled to quit smoking cigarettes. ? ? ?Discharge plan was discussed with the patient today.  I felt she was ready for discharge and she could go home and continue with prednisone and bronchodilators.  She was anxious about going home today.  She was reassured that she was better and did not need to stay in the hospital.  Initially, she agreed to go home.  However, later she changed her mind and preferred to stay in the hospital for another day. ? ? ?Diet Order   ? ?       ?  Diet - low sodium heart healthy       ?  ?  Diet Heart Room service appropriate? Yes; Fluid consistency: Thin  Diet effective now       ?  ? ?  ?  ? ?   ? ? ? ? ? ? ? ? ?Consultants: ?None ? ?Procedures: ?None ? ? ? ?Medications:  ? ? benzonatate  100 mg Oral TID  ? citalopram  10 mg Oral Daily  ? docusate sodium  100 mg Oral BID  ? fluticasone  2 spray Each Nare Daily  ? fluticasone furoate-vilanterol  1 puff Inhalation Daily  ? And  ? umeclidinium bromide  1 puff Inhalation Daily  ? heparin  5,000 Units Subcutaneous Q8H  ? ipratropium-albuterol  3 mL Nebulization Q6H  ? mirtazapine  15 mg Oral QHS  ? nicotine  14 mg Transdermal Daily  ? pantoprazole (PROTONIX) IV  40 mg Intravenous Q12H  ? QUEtiapine  200 mg Oral QHS  ? sodium chloride flush  3 mL Intravenous Q12H  ? sucralfate  1 g Oral TID WC & HS  ? ?Continuous Infusions: ? ? ?Anti-infectives (From admission, onward)  ? ? Start     Dose/Rate Route Frequency Ordered Stop  ? 02/23/22 1015  azithromycin (ZITHROMAX) tablet 500 mg  Status:  Discontinued       ?See Hyperspace for full Linked Orders Report.  ? 500 mg Oral Daily 02/22/22 1923 02/24/22 1338  ? 02/22/22 1945  azithromycin (ZITHROMAX) 500 mg in sodium chloride 0.9 % 250 mL IVPB       ?See Hyperspace for full Linked Orders Report.  ? 500 mg ?250 mL/hr over 60 Minutes Intravenous Every  24 hours 02/22/22 1923 02/22/22 2140  ? ?  ? ? ? ? ? ? ? ? ? ?Family Communication/Anticipated D/C date and plan/Code Status  ? ?DVT prophylaxis: heparin injection 5,000 Units Start: 02/23/22 0600 ? ? ?  Code Status: Full Code ? ?Family Communication: None ?Disposition Plan: Plan to discharge home tomorrow ? ? ? ? ? ? ? ? ?Subjective:  ? ?Interval events noted.  She says she did not have a good night.  She complains of anxiety.  Overall, breathing is better today.  She is not coughing as much.  She did not feel comfortable going home today. ? ?Objective:  ? ? ?Vitals:  ? 02/24/22 0222 02/24/22 0433 02/24/22 0803 02/24/22 1203  ?BP:  113/68 119/79 126/76  ?Pulse:  90 97 (!) 106  ?Resp:  '18 17 17  '$ ?Temp:  98 ?F (36.7 ?C) 98.1 ?F (36.7 ?C) 99.1 ?F (37.3 ?C)  ?TempSrc:       ?SpO2: 97% 100% 98% 96%  ?Weight:      ?Height:      ? ?No data found. ? ? ?Intake/Output Summary (Last 24 hours) at 02/24/2022 1339 ?Last data filed at 02/24/2022 1300 ?Gross per 24 hour  ?Intake 480 ml  ?Output --  ?Net 480 ml  ? ?Filed Weights  ? 02/22/22 1615  ?Weight: 54.8 kg  ? ? ?Exam: ? ?GEN: NAD ?SKIN: No rash ?EYES: EOMI ?ENT: MMM ?CV: RRR ?PULM: CTA B.  Improved air entry bilaterally, no wheezing or rales heard ?ABD: soft, ND, NT, +BS ?CNS: AAO x 3, non focal ?EXT: No edema or tenderness ? ? ? ?  ? ? ?Data Reviewed:  ? ?I have personally reviewed following labs and imaging studies: ? ?Labs: ?Labs show the following:  ? ?Basic Metabolic Panel: ?Recent Labs  ?Lab 02/22/22 ?1648  ?NA 133*  ?K 3.9  ?CL 98  ?CO2 27  ?GLUCOSE 98  ?BUN 5*  ?CREATININE 0.48  ?CALCIUM 8.8*  ? ?GFR ?Estimated Creatinine Clearance: 57.4 mL/min (by C-G formula based on SCr of 0.48 mg/dL). ?Liver Function Tests: ?No results for input(s): AST, ALT, ALKPHOS, BILITOT, PROT, ALBUMIN in the last 168 hours. ?No results for input(s): LIPASE, AMYLASE in the last 168 hours. ?No results for input(s): AMMONIA in the last 168 hours. ?Coagulation profile ?No results for input(s): INR, PROTIME in the last 168 hours. ? ?CBC: ?Recent Labs  ?Lab 02/22/22 ?1648  ?WBC 9.9  ?HGB 13.4  ?HCT 41.4  ?MCV 91.6  ?PLT 309  ? ?Cardiac Enzymes: ?No results for input(s): CKTOTAL, CKMB, CKMBINDEX, TROPONINI in the last 168 hours. ?BNP (last 3 results) ?No results for input(s): PROBNP in the last 8760 hours. ?CBG: ?No results for input(s): GLUCAP in the last 168 hours. ?D-Dimer: ?No results for input(s): DDIMER in the last 72 hours. ?Hgb A1c: ?No results for input(s): HGBA1C in the last 72 hours. ?Lipid Profile: ?No results for input(s): CHOL, HDL, LDLCALC, TRIG, CHOLHDL, LDLDIRECT in the last 72 hours. ?Thyroid function studies: ?Recent Labs  ?  02/22/22 ?1900  ?TSH 1.074  ? ?Anemia work up: ?No results for input(s): VITAMINB12, FOLATE, FERRITIN, TIBC, IRON,  RETICCTPCT in the last 72 hours. ?Sepsis Labs: ?Recent Labs  ?Lab 02/22/22 ?1648 02/22/22 ?1842  ?PROCALCITON  --  <0.10  ?WBC 9.9  --   ? ? ?Microbiology ?Recent Results (from the past 240 hour(s))  ?Resp Panel by RT-PCR (Flu A&B, Covid) Nasopharyngeal Swab     Status: None  ? Collection Time: 02/22/22  4:48  PM  ? Specimen: Nasopharyngeal Swab; Nasopharyngeal(NP) swabs in vial transport medium  ?Result Value Ref Range Status  ? SARS Coronavirus 2 by RT PCR NEGATIVE NEGATIVE Final  ?  Comment: (NOTE) ?SARS-CoV-2 target nucleic acids are NOT DETECTED. ? ?The SARS-CoV-2 RNA is generally detectable in upper respiratory ?specimens during the acute phase of infection. The lowest ?concentration of SARS-CoV-2 viral copies this assay can detect is ?138 copies/mL. A negative result does not preclude SARS-Cov-2 ?infection and should not be used as the sole basis for treatment or ?other patient management decisions. A negative result may occur with  ?improper specimen collection/handling, submission of specimen other ?than nasopharyngeal swab, presence of viral mutation(s) within the ?areas targeted by this assay, and inadequate number of viral ?copies(<138 copies/mL). A negative result must be combined with ?clinical observations, patient history, and epidemiological ?information. The expected result is Negative. ? ?Fact Sheet for Patients:  ?EntrepreneurPulse.com.au ? ?Fact Sheet for Healthcare Providers:  ?IncredibleEmployment.be ? ?This test is no t yet approved or cleared by the [place] FDA and  ?has been authorized for detection and/or diagnosis of SARS-CoV-2 by ?FDA under an Emergency Use Authorization (EUA). This EUA will remain  ?in effect (meaning this test can be used) for the duration of the ?COVID-19 declaration under Section 564(b)(1) of the Act, 21 ?U.S.C.section 360bbb-3(b)(1), unless the authorization is terminated  ?or revoked sooner.  ? ? ?  ? Influenza A by PCR  NEGATIVE NEGATIVE Final  ? Influenza B by PCR NEGATIVE NEGATIVE Final  ?  Comment: (NOTE) ?The Xpert Xpress SARS-CoV-2/FLU/RSV plus assay is intended as an aid ?in the diagnosis of influenza from Nasopharyngeal swab specimens and ?should not be used as a sole basis for treatment. Nasal

## 2022-02-24 NOTE — Progress Notes (Signed)
?  Transition of Care (TOC) Screening Note ? ? ?Patient Details  ?Name: Colleen [name] ?Date of Birth: Oct 19, 1961 ? ? ?Transition of Care (TOC) CM/SW Contact:    ?Alberteen Sam, LCSW ?Phone Number: ?02/24/2022, 9:35 AM ? ? ? ?Transition of Care Department [facility]) has reviewed patient and no TOC needs have been identified at this time. We will continue to monitor patient advancement through interdisciplinary progression rounds. If new patient transition needs arise, please place a TOC consult. ? Pricilla Riffle, [place] ?[phone] ? ?

## 2022-02-24 NOTE — Progress Notes (Signed)
Discharge instructions explained to pt/ verbalized an understanding/ iv and tele removed/ will transport off unit when ride arrives.  

## 2022-02-24 NOTE — Progress Notes (Signed)
Initial Nutrition Assessment ? ?DOCUMENTATION CODES:  ? ?Not applicable ? ?INTERVENTION:  ? ?-MVI with minerals daily ?-Liberalize diet to regular for widest varuety of meal selections ?-Ensure Enlive po BID, each supplement provides 350 kcal and 20 grams of protein.  ? ?NUTRITION DIAGNOSIS:  ? ?Increased nutrient needs related to chronic illness (COPD) as evidenced by estimated needs. ? ?GOAL:  ? ?Patient will meet greater than or equal to 90% of their needs ? ?MONITOR:  ? ?PO intake, Supplement acceptance, Labs, Weight trends, Skin, I & O's ? ?REASON FOR ASSESSMENT:  ? ?Consult ?Assessment of nutrition requirement/status ? ?ASSESSMENT:  ? ?Colleen [name] is a 61 y.o. female with medical history significant of anxiety, PTSD, depression, COPD presenting with shortness of breath that is chronic and has been getting worse over the past few days. ? ?Pt admitted with COPD exacerbation.  ? ?Reviewed I/O's: +240 ml x 24 hours and +652 ml since admission ? ?Pt unavailable at time of visit. RD unable to obtain further nutrition-related history or complete nutrition-focused physical exam at this time.   ? ?Pt currently on a heart healthy diet. Noted meal completions 100%.  ?  ?Reviewed wt hx; pt has experienced a 8.7% wt loss over the past 3 months, which is significant for time frame.  ? ?Pt with increased nutritional needs for chronic illness and would benefit from addition of oral nutrition supplements.  ? ?Per MD notes, pt originally scheduled to discharge home today, however, pt requests to spend another night in the hospital.  ? ?Lab Results  ?Component Value Date  ? HGBA1C 5.8 (H) 12/18/2021  ? PTA DM medications are none.  ? ?Labs reviewed: Na: 133, CBGS: 101 (inpatient orders for glycemic control are none).   ? ?Diet Order:   ?Diet Order   ? ?       ?  Diet - low sodium heart healthy       ?  ?  Diet Heart Room service appropriate? Yes; Fluid consistency: Thin  Diet effective now       ?  ? ?  ?  ? ?  ? ? ?EDUCATION  NEEDS:  ? ?No education needs have been identified at this time ? ?Skin:  Skin Assessment: Reviewed RN Assessment ? ?Last BM:  02/21/22 ? ?Height:  ? ?Ht Readings from Last 1 Encounters:  ?02/22/22 5' (1.524 m)  ? ? ?Weight:  ? ?Wt Readings from Last 1 Encounters:  ?02/22/22 54.8 kg  ? ? ?Ideal Body Weight:  45.5 kg ? ?BMI:  Body mass index is 23.59 kg/m?. ? ?Estimated Nutritional Needs:  ? ?Kcal:  1650-1850 ? ?Protein:  85-100 grams ? ?Fluid:  > 1.6 L ? ? ? ?Loistine Chance, RD, LDN, CDCES ?Registered Dietitian II ?Certified Diabetes Care and Education Specialist ?Please refer to [facility] for RD and/or RD on-call/weekend/after hours pager  ?

## 2022-02-25 DIAGNOSIS — J441 Chronic obstructive pulmonary disease with (acute) exacerbation: Secondary | ICD-10-CM | POA: Diagnosis not present

## 2022-02-25 NOTE — Progress Notes (Signed)
Patient alert and orientedx4. Patient on 2L/[place] with cough. Patient reported anxiety this AM and PRN valium givenx1. Patient educated on discharge instructions and all questions answered. Patient instructed to call back with any other questions and given [facility] nurse advice line phone number. Assisted to car on oxygen and trasferred to home concentrater. Patient steady on ambulation. No signs of distress at discharge.  ?

## 2022-02-25 NOTE — Discharge Summary (Signed)
?Physician Discharge Summary ?  ?Patient: Colleen [name] MRN: [id number] DOB: [birth year]  ?Admit date:     02/22/2022  ?Discharge date: 02/25/2022  ?Discharge Physician: Jennye Boroughs  ? ?PCP: Remi Haggard, FNP  ? ?Recommendations at discharge:  ? ?Follow-up with PCP in 1 week ? ? ?Discharge Diagnoses: ?Principal Problem: ?  Acute exacerbation of chronic obstructive pulmonary disease (COPD) ([place]) ?Active Problems: ?  SOB (shortness of breath) ?  GAD (generalized anxiety disorder) ?  Tobacco use disorder ?  Low back pain ? ?Resolved Problems: ?  * No resolved hospital problems. * ? ?Hospital Course: ? ?[name] is a 61 y.o. female with medical history significant for COPD, chronic cough, "sinus problems", tobacco use disorder, chronic hypoxic respiratory failure on nocturnal oxygen 2 L/min as needed, PTSD, chronic neck and back pain, asthma, anxiety, panic attacks, history of breast cancer with radiation therapy, who presented to the hospital because of increasing shortness of breath and cough.   ? ?She was admitted to the hospital for COPD exacerbation and acute on chronic hypoxic respiratory failure.  She was treated with steroids, bronchodilators and oxygen.  Her condition has improved and she is deemed stable for discharge to home today.  She is worried about her chronic cough.  She said she has seen different doctors including ENT physician.  She said she was told nothing could be done for her chronic cough.  She said she has an upcoming appointment with a pulmonologist at [facility] in June 2023.  She has been advised to stop smoking cigarettes. ? ? ? ? ?  ? ? ?Consultants: None ?Procedures performed: None ?Disposition: Home ?Diet recommendation:  ?Discharge Diet Orders (From admission, onward)  ? ?  Start     Ordered  ? 02/24/22 0000  Diet - low sodium heart healthy       ? 02/24/22 0930  ? ?  ?  ? ?  ? ?Cardiac diet ?DISCHARGE MEDICATION: ?Allergies as of 02/25/2022   ? ?   Reactions  ?  Desvenlafaxine Anaphylaxis  ? Morphine And Related Anaphylaxis  ? Penicillins Anaphylaxis  ? Has patient had a PCN reaction causing immediate rash, facial/tongue/throat swelling, SOB or lightheadedness with hypotension: Yes ?Has patient had a PCN reaction causing severe rash involving mucus membranes or skin necrosis: No ?Has patient had a PCN reaction that required hospitalization: Unknown ?Has patient had a PCN reaction occurring within the last 10 years: No ?If all of the above answers are "NO", then may proceed with Cephalosporin use.  ? Prazosin Other (See Comments)  ? Tamoxifen Anaphylaxis  ? Trazodone Anaphylaxis  ? Clonazepam   ? Codeine Swelling  ? Duloxetine Itching  ? Gabapentin Hives  ? Rash and swelling.   ? Hydroxyzine Itching  ? Lorazepam   ? Patient feels this medications makes her to sedated and wants to avoid  ? Paroxetine Hcl Hives  ? Sulfa Antibiotics Itching  ? Cephalexin Rash  ? ?  ? ?  ?Medication List  ?  ? ?TAKE these medications   ? ?albuterol 108 (90 Base) MCG/ACT inhaler ?Commonly known as: VENTOLIN HFA ?Inhale 1-2 puffs into the lungs every 4 (four) hours as needed for cough or wheezing. ?  ?cholecalciferol 25 MCG (1000 UNIT) tablet ?Commonly known as: VITAMIN D3 ?Take 1,000 Units by mouth daily. ?  ?citalopram 10 MG tablet ?Commonly known as: CELEXA ?Take 1 tablet (10 mg total) by mouth daily. ?  ?cyclobenzaprine 10 MG tablet ?Commonly known as: FLEXERIL ?  Take 1 tablet (10 mg total) by mouth 3 (three) times daily as needed for muscle spasms. ?  ?denosumab 60 MG/ML Sosy injection ?Commonly known as: PROLIA ?Inject 60 mg into the skin every 6 (six) months. ?  ?diazepam 2 MG tablet ?Commonly known as: VALIUM ?Take 1 tablet (2 mg total) by mouth daily as needed for anxiety. For severe panic symptoms ?  ?EPINEPHrine 0.3 mg/0.3 mL Soaj injection ?Commonly known as: EPI-PEN ?Inject 0.3 mg into the muscle as needed for anaphylaxis. ?  ?fluticasone 50 MCG/ACT nasal spray ?Commonly known as:  FLONASE ?Place 2 sprays into both nostrils daily. ?  ?guaiFENesin-dextromethorphan 100-10 MG/5ML syrup ?Commonly known as: ROBITUSSIN DM ?Take 10 mLs by mouth every 4 (four) hours as needed for cough. ?  ?ipratropium 0.02 % nebulizer solution ?Commonly known as: ATROVENT ?Take 0.25 mg by nebulization 4 (four) times daily. ?  ?mirtazapine 15 MG tablet ?Commonly known as: REMERON ?Take 1 tablet (15 mg total) by mouth at bedtime. ?  ?predniSONE 20 MG tablet ?Commonly known as: DELTASONE ?Take 2 tablets (40 mg total) by mouth daily for 2 days. ?  ?QUEtiapine 200 MG tablet ?Commonly known as: SEROQUEL ?Take 1 tablet (200 mg total) by mouth at bedtime. ?  ?Trelegy Ellipta 100-62.5-25 MCG/ACT Aepb ?Generic drug: Fluticasone-Umeclidin-Vilant ?Inhale 1 puff into the lungs daily. ?  ?vitamin B-12 500 MCG tablet ?Commonly known as: CYANOCOBALAMIN ?Take 500 mcg by mouth daily. ?  ? ?  ? ? ?Discharge Exam: ?Danley Danker Weights  ? 02/22/22 1615  ?Weight: 54.8 kg  ? ?GEN: NAD ?SKIN: No rash ?EYES: EOMI ?ENT: MMM ?CV: RRR ?PULM: CTA B ?ABD: soft, ND, NT, +BS ?CNS: AAO x 3, non focal ?EXT: No edema or tenderness ? ? ?Condition at discharge: good ? ?The results of significant diagnostics from this hospitalization (including imaging, microbiology, ancillary and laboratory) are listed below for reference.  ? ?Imaging Studies: ?DG Chest 2 View ? ?Result Date: 02/22/2022 ?CLINICAL DATA:  Shortness of breath. EXAM: CHEST - 2 VIEW COMPARISON:  02/12/22 FINDINGS: The heart size and mediastinal contours are within normal limits. No pleural effusion or edema. Both lungs are clear. Healing right posterior seventh rib fracture deformity is again noted and appears similar to previous exam. IMPRESSION: No active cardiopulmonary abnormalities. Electronically Signed   By: Kerby Moors M.D.   On: 02/22/2022 16:44  ? ?DG Chest 2 View ? ?Result Date: 01/28/2022 ?CLINICAL DATA:  Shortness of breath. EXAM: CHEST - 2 VIEW COMPARISON:  01/07/2022 FINDINGS:  Numerous leads and wires project over the chest. Midline trachea. Normal heart size. No pleural effusion or pneumothorax. Hyperinflation and interstitial coarsening, consistent with the clinical history of COPD. Clear lungs. IMPRESSION: No acute cardiopulmonary disease. Hyperinflation and interstitial thickening, consistent with COPD/chronic bronchitis. Electronically Signed   By: Abigail Miyamoto M.D.   On: 01/28/2022 16:32  ? ?CT CHEST WO CONTRAST ? ?Result Date: 02/12/2022 ?CLINICAL DATA:  Lung nodule shortness of breath EXAM: CT CHEST WITHOUT CONTRAST TECHNIQUE: Multidetector CT imaging of the chest was performed following the standard protocol without IV contrast. RADIATION DOSE REDUCTION: This exam was performed according to the departmental dose-optimization program which includes automated exposure control, adjustment of the mA and/or kV according to patient size and/or use of iterative reconstruction technique. COMPARISON:  CT done on 10/19/2021, chest radiograph done on 02/12/2022 FINDINGS: Cardiovascular: Scattered coronary artery calcifications are seen. Mediastinum/Nodes: There are subcentimeter nodes in the mediastinum. Lungs/Pleura: Centrilobular emphysema is seen. There is no focal pulmonary consolidation. There  are no discrete lung nodules. There is pleural thickening in the posterolateral aspect of right mid lung fields adjacent to the posterolateral aspect of right seventh rib. There is break in the cortical margins in the posterolateral aspect of right seventh rib suggesting a healing fracture. This finding corresponds to the area of ill-defined nodule seen in the chest radiograph. Small linear densities seen in the lower lung fields suggesting scarring or subsegmental atelectasis. Upper Abdomen: Unremarkable. Musculoskeletal: Healing fracture is seen in the posterolateral aspect of right seventh rib. Old healed fractures are seen in the posterior aspect of right fifth and sixth ribs close to the  costovertebral junction. IMPRESSION: COPD.  There are no focal infiltrates or discrete lung nodules. A healing fracture with adjacent pleural thickening is seen in the posterolateral aspect of right seventh rib which corres

## 2022-02-26 ENCOUNTER — Ambulatory Visit: Payer: Self-pay | Admitting: *Deleted

## 2022-02-26 NOTE — Telephone Encounter (Signed)
?  Chief Complaint: anxiety and pain requesting what to do  ?Symptoms: anxious , took valium at 0400 and again at 0800. Took prednisone at 1030 this am and now feeling severe anxiety and pain in neck , back and hips ?Frequency: today  ?Pertinent Negatives: Patient denies chest pain difficulty breathing, no issues with walking or mobility  ?Disposition: '[]'$ ED /'[x]'$ Urgent Care (no appt availability in office) / '[]'$ Appointment(In office/virtual)/ '[]'$  [facility]/ '[]'$ Home Care/ '[]'$ Refused Recommended Disposition /'[]'$ [facility]/ '[x]'$  Follow-up with PCP ?Additional Notes:  ? ?Recommended patient to call PCP regarding pain or go to UC/ ED for evaluation. Can call pharmacy if she has further questions for when to take medications and side effects.  ? ? Reason for Disposition ? Patient sounds very upset or troubled to the triager ? ?Answer Assessment - Initial Assessment Questions ?1. CONCERN: "Did anything happen that prompted you to call today?"  ?    Taking multiple medications causing worsening anxiety and pain  ?2. ANXIETY SYMPTOMS: "Can you describe how you (your loved one; patient) have been feeling?" (e.g., tense, restless, panicky, anxious, keyed up, overwhelmed, sense of impending doom).  ?    Anxiety  ?3. ONSET: "How long have you been feeling this way?" (e.g., hours, days, weeks) ?    Today  ?4. SEVERITY: "How would you rate the level of anxiety?" (e.g., 0 - 10; or mild, moderate, severe). ?    severe ?5. FUNCTIONAL IMPAIRMENT: "How have these feelings affected your ability to do daily activities?" "Have you had more difficulty than usual doing your normal daily activities?" (e.g., getting better, same, worse; self-care, school, work, interactions) ?    na ?6. HISTORY: "Have you felt this way before?" "Have you ever been diagnosed with an anxiety problem in the past?" (e.g., generalized anxiety disorder, panic attacks, PTSD). If Yes, ask: "How was this problem treated?" (e.g., medicines,  counseling, etc.) ?    yes ?7. RISK OF HARM - SUICIDAL IDEATION: "Do you ever have thoughts of hurting or killing yourself?" If Yes, ask:  "Do you have these feelings now?" "Do you have a plan on how you would do this?" ?    na ?8. TREATMENT:  "What has been done so far to treat this anxiety?" (e.g., medicines, relaxation strategies). "What has helped?" ?    Taking valium and took prednisone and feeling pain in neck, back and hips  ?9. TREATMENT - THERAPIST: "Do you have a counselor or therapist? Name?" ?    Yes  ?10. POTENTIAL TRIGGERS: "Do you drink caffeinated beverages (e.g., coffee, colas, teas), and how much daily?" "Do you drink alcohol or use any drugs?" "Have you started any new medicines recently?" ?    na ?10. PATIENT SUPPORT: "Who is with you now?" "Who do you live with?" "Do you have family or friends who you can talk to?"  ?      na ?11. OTHER SYMPTOMS: "Do you have any other symptoms?" (e.g., feeling depressed, trouble concentrating, trouble sleeping, trouble breathing, palpitations or fast heartbeat, chest pain, sweating, nausea, or diarrhea) ?      Severe pain in neck back and hips  ?12. PREGNANCY: "Is there any chance you are pregnant?" "When was your last menstrual period?" ?      na ? ?Protocols used: Anxiety and Panic Attack-A-AH ? ?

## 2022-02-28 ENCOUNTER — Other Ambulatory Visit: Payer: Self-pay | Admitting: Student in an Organized Health Care Education/Training Program

## 2022-03-06 ENCOUNTER — Telehealth: Payer: Self-pay | Admitting: Student in an Organized Health Care Education/Training Program

## 2022-03-06 ENCOUNTER — Other Ambulatory Visit: Payer: Self-pay | Admitting: *Deleted

## 2022-03-06 MED ORDER — CYCLOBENZAPRINE HCL 10 MG PO TABS
10.0000 mg | ORAL_TABLET | Freq: Three times a day (TID) | ORAL | 0 refills | Status: DC | PRN
Start: 2022-03-06 — End: 2022-06-18

## 2022-03-06 NOTE — Telephone Encounter (Signed)
Refill request sent on flexeril.  ?

## 2022-03-06 NOTE — Telephone Encounter (Signed)
Pt stated she was returning an call from a nurse. Please give pt a call. Patient does have a appt coming up. Thanks ?

## 2022-03-06 NOTE — Telephone Encounter (Signed)
Voicemail left for patient that flexeril has been sent in.  ?

## 2022-03-09 ENCOUNTER — Other Ambulatory Visit: Payer: Self-pay

## 2022-03-09 ENCOUNTER — Emergency Department

## 2022-03-09 ENCOUNTER — Inpatient Hospital Stay
Admission: EM | Admit: 2022-03-09 | Discharge: 2022-03-11 | DRG: 190 | Disposition: A | Discharge status: Home / Self Care | Attending: Obstetrics and Gynecology | Admitting: Obstetrics and Gynecology

## 2022-03-09 DIAGNOSIS — Z882 Allergy status to sulfonamides status: Secondary | ICD-10-CM | POA: Diagnosis not present

## 2022-03-09 DIAGNOSIS — Z20822 Contact with and (suspected) exposure to covid-19: Secondary | ICD-10-CM | POA: Diagnosis present

## 2022-03-09 DIAGNOSIS — Z803 Family history of malignant neoplasm of breast: Secondary | ICD-10-CM | POA: Diagnosis not present

## 2022-03-09 DIAGNOSIS — J9621 Acute and chronic respiratory failure with hypoxia: Secondary | ICD-10-CM | POA: Diagnosis present

## 2022-03-09 DIAGNOSIS — Z905 Acquired absence of kidney: Secondary | ICD-10-CM | POA: Diagnosis not present

## 2022-03-09 DIAGNOSIS — Z9851 Tubal ligation status: Secondary | ICD-10-CM | POA: Diagnosis not present

## 2022-03-09 DIAGNOSIS — F411 Generalized anxiety disorder: Secondary | ICD-10-CM | POA: Diagnosis present

## 2022-03-09 DIAGNOSIS — Z881 Allergy status to other antibiotic agents status: Secondary | ICD-10-CM | POA: Diagnosis not present

## 2022-03-09 DIAGNOSIS — Z853 Personal history of malignant neoplasm of breast: Secondary | ICD-10-CM

## 2022-03-09 DIAGNOSIS — J441 Chronic obstructive pulmonary disease with (acute) exacerbation: Principal | ICD-10-CM | POA: Diagnosis present

## 2022-03-09 DIAGNOSIS — F17210 Nicotine dependence, cigarettes, uncomplicated: Secondary | ICD-10-CM | POA: Diagnosis present

## 2022-03-09 DIAGNOSIS — Z888 Allergy status to other drugs, medicaments and biological substances status: Secondary | ICD-10-CM

## 2022-03-09 DIAGNOSIS — Z923 Personal history of irradiation: Secondary | ICD-10-CM | POA: Diagnosis not present

## 2022-03-09 DIAGNOSIS — I2510 Atherosclerotic heart disease of native coronary artery without angina pectoris: Secondary | ICD-10-CM | POA: Diagnosis present

## 2022-03-09 DIAGNOSIS — Z885 Allergy status to narcotic agent status: Secondary | ICD-10-CM | POA: Diagnosis not present

## 2022-03-09 DIAGNOSIS — M549 Dorsalgia, unspecified: Secondary | ICD-10-CM | POA: Diagnosis present

## 2022-03-09 DIAGNOSIS — G894 Chronic pain syndrome: Secondary | ICD-10-CM | POA: Diagnosis present

## 2022-03-09 DIAGNOSIS — Z9981 Dependence on supplemental oxygen: Secondary | ICD-10-CM | POA: Diagnosis not present

## 2022-03-09 DIAGNOSIS — Z9012 Acquired absence of left breast and nipple: Secondary | ICD-10-CM | POA: Diagnosis not present

## 2022-03-09 DIAGNOSIS — F4310 Post-traumatic stress disorder, unspecified: Secondary | ICD-10-CM | POA: Diagnosis present

## 2022-03-09 DIAGNOSIS — Z880 Allergy status to penicillin: Secondary | ICD-10-CM | POA: Diagnosis not present

## 2022-03-09 DIAGNOSIS — F17200 Nicotine dependence, unspecified, uncomplicated: Secondary | ICD-10-CM | POA: Diagnosis present

## 2022-03-09 DIAGNOSIS — Z79899 Other long term (current) drug therapy: Secondary | ICD-10-CM

## 2022-03-09 DIAGNOSIS — F332 Major depressive disorder, recurrent severe without psychotic features: Secondary | ICD-10-CM | POA: Diagnosis present

## 2022-03-09 DIAGNOSIS — F410 Panic disorder [episodic paroxysmal anxiety] without agoraphobia: Secondary | ICD-10-CM | POA: Diagnosis present

## 2022-03-09 LAB — BRAIN NATRIURETIC PEPTIDE: B Natriuretic Peptide: 9.7 pg/mL (ref 0.0–100.0)

## 2022-03-09 LAB — CBC
HCT: 44.5 % (ref 36.0–46.0)
HCT: 45.5 % (ref 36.0–46.0)
Hemoglobin: 14.3 g/dL (ref 12.0–15.0)
Hemoglobin: 14.3 g/dL (ref 12.0–15.0)
MCH: 30.2 pg (ref 26.0–34.0)
MCH: 29.6 pg (ref 26.0–34.0)
MCHC: 32.1 g/dL (ref 30.0–36.0)
MCHC: 31.4 g/dL (ref 30.0–36.0)
MCV: 94.1 fL (ref 80.0–100.0)
MCV: 94.2 fL (ref 80.0–100.0)
Platelets: 356 10*3/uL (ref 150–400)
Platelets: 316 10*3/uL (ref 150–400)
RBC: 4.73 MIL/uL (ref 3.87–5.11)
RBC: 4.83 MIL/uL (ref 3.87–5.11)
RDW: 15.5 % (ref 11.5–15.5)
RDW: 15.2 % (ref 11.5–15.5)
WBC: 7.5 10*3/uL (ref 4.0–10.5)
WBC: 5.4 10*3/uL (ref 4.0–10.5)
nRBC: 0 % (ref 0.0–0.2)
nRBC: 0 % (ref 0.0–0.2)

## 2022-03-09 LAB — COMPREHENSIVE METABOLIC PANEL
ALT: 15 U/L (ref 0–44)
AST: 16 U/L (ref 15–41)
Albumin: 4.1 g/dL (ref 3.5–5.0)
Alkaline Phosphatase: 49 U/L (ref 38–126)
Anion gap: 8 (ref 5–15)
BUN: 8 mg/dL (ref 8–23)
CO2: 30 mmol/L (ref 22–32)
Calcium: 9.5 mg/dL (ref 8.9–10.3)
Chloride: 96 mmol/L — ABNORMAL LOW (ref 98–111)
Creatinine, Ser: 0.52 mg/dL (ref 0.44–1.00)
GFR, Estimated: >60 mL/min (ref >60)
Glucose, Bld: 104 mg/dL — ABNORMAL HIGH (ref 70–99)
Potassium: 4.5 mmol/L (ref 3.5–5.1)
Sodium: 134 mmol/L — ABNORMAL LOW (ref 135–145)
Total Bilirubin: 0.5 mg/dL (ref 0.3–1.2)
Total Protein: 7.2 g/dL (ref 6.5–8.1)

## 2022-03-09 LAB — RESP PANEL BY RT-PCR (FLU A&B, COVID) ARPGX2
Influenza A by PCR: NEGATIVE
Influenza B by PCR: NEGATIVE
SARS Coronavirus 2 by RT PCR: NEGATIVE

## 2022-03-09 LAB — TROPONIN I (HIGH SENSITIVITY)
Troponin I (High Sensitivity): 6 ng/L (ref <18)
Troponin I (High Sensitivity): 5 ng/L (ref <18)

## 2022-03-09 IMAGING — DX DG CHEST 1V PORT
1 series · 1 of 1 positions shown · non-contrast
Comparison: [DATE] CT, chest radiograph prior studies

CLINICAL DATA: Shortness of breath.

EXAM:
PORTABLE CHEST 1 VIEW

[chest ap]
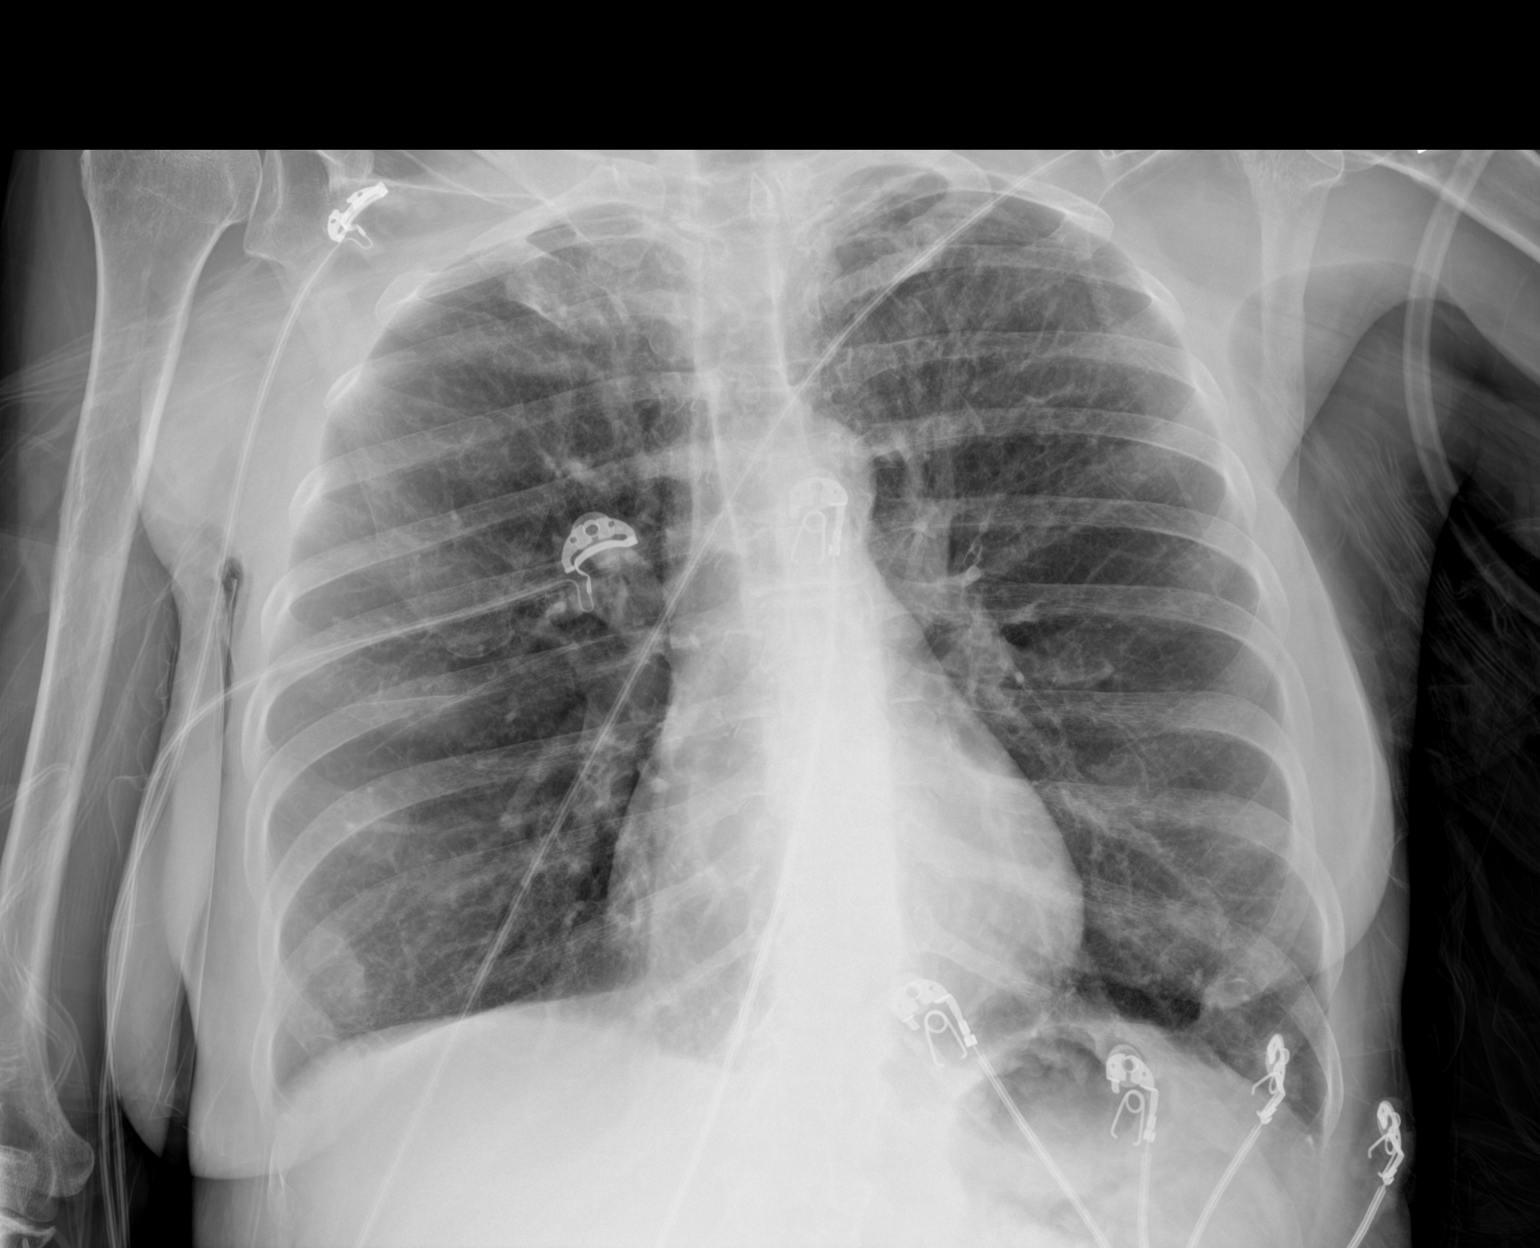

[1 of 1 positions shown; findings below may reference images not displayed]

FINDINGS: The cardiomediastinal silhouette is unremarkable.

COPD/emphysema changes again identified.

There is no evidence of focal airspace disease, pulmonary edema,
suspicious pulmonary nodule/mass, pleural effusion, or pneumothorax.

No acute bony abnormalities are identified. A healing RIGHT 7th rib
fracture is again identified
IMPRESSION: COPD/emphysema without evidence of acute cardiopulmonary disease.

## 2022-03-09 MED ORDER — ENOXAPARIN SODIUM 40 MG/0.4ML IJ SOSY
40.0000 mg | PREFILLED_SYRINGE | INTRAMUSCULAR | Status: DC
  Administered 2022-03-09 – 2022-03-10 (×2): 40 mg via SUBCUTANEOUS
  Filled 2022-03-09 (×2): qty 0.4

## 2022-03-09 MED ORDER — ACETAMINOPHEN 325 MG PO TABS
650.0000 mg | ORAL_TABLET | Freq: Four times a day (QID) | ORAL | Status: DC | PRN
  Administered 2022-03-10: 650 mg via ORAL
  Filled 2022-03-09: qty 2

## 2022-03-09 MED ORDER — UMECLIDINIUM BROMIDE 62.5 MCG/ACT IN AEPB
1.0000 | INHALATION_SPRAY | Freq: Every day | RESPIRATORY_TRACT | Status: DC
  Administered 2022-03-10 – 2022-03-11 (×2): 1 via RESPIRATORY_TRACT
  Filled 2022-03-09: qty 7

## 2022-03-09 MED ORDER — DIAZEPAM 5 MG/ML IJ SOLN
2.5000 mg | Freq: Once | INTRAMUSCULAR | Status: AC
  Administered 2022-03-09: 2.5 mg via INTRAVENOUS
  Filled 2022-03-09: qty 2

## 2022-03-09 MED ORDER — QUETIAPINE FUMARATE 25 MG PO TABS
200.0000 mg | ORAL_TABLET | Freq: Every day | ORAL | Status: DC
  Administered 2022-03-09 – 2022-03-10 (×2): 200 mg via ORAL
  Filled 2022-03-09 (×2): qty 8

## 2022-03-09 MED ORDER — IPRATROPIUM-ALBUTEROL 0.5-2.5 (3) MG/3ML IN SOLN
3.0000 mL | Freq: Once | RESPIRATORY_TRACT | Status: AC
Start: 2022-03-09 — End: 2022-03-09
  Administered 2022-03-09: 3 mL via RESPIRATORY_TRACT
  Filled 2022-03-09: qty 3

## 2022-03-09 MED ORDER — LEVOFLOXACIN IN D5W 750 MG/150ML IV SOLN
750.0000 mg | INTRAVENOUS | Status: DC
  Administered 2022-03-09: 750 mg via INTRAVENOUS
  Filled 2022-03-09 (×2): qty 150

## 2022-03-09 MED ORDER — ALBUTEROL SULFATE (2.5 MG/3ML) 0.083% IN NEBU
2.5000 mg | INHALATION_SOLUTION | RESPIRATORY_TRACT | Status: DC | PRN
  Administered 2022-03-09 (×2): 2.5 mg via RESPIRATORY_TRACT
  Filled 2022-03-09 (×2): qty 3

## 2022-03-09 MED ORDER — GUAIFENESIN ER 600 MG PO TB12
600.0000 mg | ORAL_TABLET | Freq: Two times a day (BID) | ORAL | Status: DC
  Administered 2022-03-09 – 2022-03-11 (×5): 600 mg via ORAL
  Filled 2022-03-09 (×5): qty 1

## 2022-03-09 MED ORDER — CITALOPRAM HYDROBROMIDE 20 MG PO TABS
10.0000 mg | ORAL_TABLET | Freq: Every day | ORAL | Status: DC
Start: 2022-03-10 — End: 2022-03-11
  Administered 2022-03-10 – 2022-03-11 (×2): 10 mg via ORAL
  Filled 2022-03-09 (×2): qty 1

## 2022-03-09 MED ORDER — PREDNISONE 20 MG PO TABS
40.0000 mg | ORAL_TABLET | Freq: Every day | ORAL | Status: DC
  Filled 2022-03-09: qty 2

## 2022-03-09 MED ORDER — FLUTICASONE FUROATE-VILANTEROL 100-25 MCG/ACT IN AEPB
1.0000 | INHALATION_SPRAY | Freq: Every day | RESPIRATORY_TRACT | Status: DC
Start: 2022-03-10 — End: 2022-03-11
  Administered 2022-03-10 – 2022-03-11 (×2): 1 via RESPIRATORY_TRACT
  Filled 2022-03-09: qty 28

## 2022-03-09 MED ORDER — ACETAMINOPHEN 650 MG RE SUPP: 650.0000 mg | Freq: Four times a day (QID) | RECTAL | Status: DC | PRN

## 2022-03-09 MED ORDER — BISACODYL 5 MG PO TBEC: 5.0000 mg | DELAYED_RELEASE_TABLET | Freq: Every day | ORAL | Status: DC | PRN

## 2022-03-09 MED ORDER — IPRATROPIUM-ALBUTEROL 0.5-2.5 (3) MG/3ML IN SOLN
3.0000 mL | Freq: Four times a day (QID) | RESPIRATORY_TRACT | Status: DC
Start: 2022-03-09 — End: 2022-03-11
  Administered 2022-03-09 – 2022-03-11 (×6): 3 mL via RESPIRATORY_TRACT
  Filled 2022-03-09 (×9): qty 3

## 2022-03-09 MED ORDER — CYCLOBENZAPRINE HCL 10 MG PO TABS
10.0000 mg | ORAL_TABLET | Freq: Three times a day (TID) | ORAL | Status: DC | PRN
  Filled 2022-03-09: qty 1

## 2022-03-09 MED ORDER — SODIUM CHLORIDE 0.9 % IV SOLN: INTRAVENOUS | Status: AC

## 2022-03-09 MED ORDER — PREDNISONE 20 MG PO TABS
40.0000 mg | ORAL_TABLET | Freq: Every day | ORAL | Status: DC
  Administered 2022-03-10 – 2022-03-11 (×2): 40 mg via ORAL
  Filled 2022-03-09 (×2): qty 2

## 2022-03-09 MED ORDER — DIAZEPAM 2 MG PO TABS
2.0000 mg | ORAL_TABLET | Freq: Every day | ORAL | Status: DC | PRN
  Administered 2022-03-09 – 2022-03-10 (×2): 2 mg via ORAL
  Filled 2022-03-09 (×2): qty 1

## 2022-03-09 MED ORDER — MIRTAZAPINE 15 MG PO TABS
15.0000 mg | ORAL_TABLET | Freq: Every day | ORAL | Status: DC
Start: 2022-03-09 — End: 2022-03-11
  Administered 2022-03-09 – 2022-03-10 (×2): 15 mg via ORAL
  Filled 2022-03-09 (×3): qty 1

## 2022-03-09 MED ORDER — POLYETHYLENE GLYCOL 3350 17 G PO PACK: 17.0000 g | PACK | Freq: Every day | ORAL | Status: DC | PRN

## 2022-03-09 NOTE — Plan of Care (Signed)
?  Problem: Education: ?Goal: Knowledge of disease or condition will improve ?Outcome: Progressing ?Goal: Knowledge of the prescribed therapeutic regimen will improve ?Outcome: Progressing ?Goal: Individualized Educational Video(s) ?Outcome: Progressing ?  ?Problem: Activity: ?Goal: Ability to tolerate increased activity will improve ?Outcome: Progressing ?Goal: Will verbalize the importance of balancing activity with adequate rest periods ?Outcome: Progressing ?  ?

## 2022-03-09 NOTE — ED Notes (Signed)
Pt experiencing frequent nonproductive cough. ?

## 2022-03-09 NOTE — ED Notes (Signed)
Report received from Julia, RN 

## 2022-03-09 NOTE — ED Triage Notes (Signed)
Pt comes ems from home with shob. Hx of copd. Ems was unsure of RA sat on arrival but pt arrived on Cpap. Switched by bipap on arrival. Was given solumedrol and two breathing treatments with ems. Pt Aox4.  ?

## 2022-03-09 NOTE — ED Notes (Signed)
Pt stating "I think Im having a panic attack". ?

## 2022-03-09 NOTE — ED Provider Notes (Signed)
? ?[facility] ?Provider Note ? ? ? Event Date/Time  ? First MD Initiated Contact with Patient 03/09/22 1052   ?  (approximate) ? ?History  ? ?Chief Complaint: Shortness of Breath ? ?HPI ? ?Colleen [name] is a 61 y.o. female with a past medical history of asthma, COPD, presents to the emergency department for shortness of breath.  According to the patient she has had increased cough for the past several days with worsening shortness of breath much worse overnight last night.  Patient states she wears 2 L of oxygen at night only.  Patient states she was extremely short of breath with chest tightness this morning so she called EMS.  EMS states upon their arrival patient is on 2.5 L of oxygen satting around 90%, diminished breath sounds bilaterally with significant work of breathing placed on CPAP and brought to the emergency department.  Here patient continues to have significant work of breathing sitting upright, transition to BiPAP on arrival. ? ?Physical Exam  ? ?Triage Vital Signs: ?ED Triage Vitals  ?Enc Vitals Group  ?   BP --   ?   Pulse --   ?   Resp --   ?   Temp --   ?   Temp src --   ?   SpO2 --   ?   Weight 03/09/22 1054 121 lb (54.9 kg)  ?   Height --   ?   Head Circumference --   ?   Peak Flow --   ?   Pain Score 03/09/22 1053 9  ?   Pain Loc --   ?   Pain Edu? --   ?   Excl. in [place]? --   ? ? ?Most recent vital signs: ?There were no vitals filed for this visit. ? ?General: Awake, sitting upright in bed, CPAP in place.  Mild respiratory distress ?CV:  Good peripheral perfusion.  Regular rate and rhythm  ?Resp:  Moderate tachypnea, diminished breath sounds bilaterally with slight expiratory wheeze auscultated.   ?Abd:  No distention.  Soft, nontender.  No rebound or guarding. ?Other:  No lower extreme edema or tenderness. ? ? ?ED Results / Procedures / Treatments  ? ?EKG ? ?EKG viewed and interpreted by myself shows sinus tachycardia 109 bpm with a narrow QRS, normal axis, normal  intervals, no concerning ST changes. ? ?RADIOLOGY ? ?I reviewed the chest x-ray images, I do not see any obvious pneumothorax or large pneumonia. ?COPD/emphysema without acute process. ? ? ?MEDICATIONS ORDERED IN ED: ?Medications  ?ipratropium-albuterol (DUONEB) 0.5-2.5 (3) MG/3ML nebulizer solution 3 mL (has no administration in time range)  ?ipratropium-albuterol (DUONEB) 0.5-2.5 (3) MG/3ML nebulizer solution 3 mL (has no administration in time range)  ? ? ? ?IMPRESSION / MDM / ASSESSMENT AND PLAN / ED COURSE  ?I reviewed the triage vital signs and the nursing notes. ? ?Patient presents to the emergency department for several days of worsening cough shortness of breath now with chest tightness and worsening shortness of breath this morning.  Patient with mild respiratory distress sitting upright in bed switch from CPAP to BiPAP.  Has diminished breath sounds bilaterally with mild expiratory wheeze.  Patient received 2 DuoNebs as well as Solu-Medrol in route to the hospital we will dose 2 additional DuoNebs in the emergency department.  We will check labs including cardiac enzymes obtain a chest x-ray COVID swab and continue to closely monitor.  Differential would include COPD exacerbation, asthma exacerbation, ACS, less likely CHF. ? ?  Patient's labs overall reassuring, normal BNP, negative troponin, reassuring CBC as well as CMP.  Patient remains somewhat tachypneic.  We will attempt to wean off BiPAP.  Patient will require admission to the hospital service for COPD exacerbation. ? ?CRITICAL CARE ?Performed by: Harvest Dark ? ? ?Total critical care time: 30 minutes ? ?Critical care time was exclusive of separately billable procedures and treating other patients. ? ?Critical care was necessary to treat or prevent imminent or life-threatening deterioration. ? ?Critical care was time spent personally by me on the following activities: development of treatment plan with patient and/or surrogate as well as  nursing, discussions with consultants, evaluation of patient's response to treatment, examination of patient, obtaining history from patient or surrogate, ordering and performing treatments and interventions, ordering and review of laboratory studies, ordering and review of radiographic studies, pulse oximetry and re-evaluation of patient's condition. ? ? ?FINAL CLINICAL IMPRESSION(S) / ED DIAGNOSES  ? ?COPD exacerbation ?Dyspnea ? ?Note:  This document was prepared using Dragon voice recognition software and may include unintentional dictation errors. ?  Harvest Dark, MD ?03/09/22 1202 ? ?

## 2022-03-09 NOTE — H&P (Signed)
? ? ?History and Physical:  ? ? ?Colleen [name]  ? ?YQI:[id number] DOB: [birth year] DOA: 03/09/2022 ? ?Referring MD/provider: Paduchowski ?PCP: Remi Haggard, FNP  ? ?Patient coming from: Home ? ?Chief Complaint: SOB x 2 days  ? ?History of Present Illness:  ? ?Colleen [name] is an 61 y.o. female with COPD and ongoing tobacco use who was discahrged 12 days ago after treatment for COPD flare. Patient was feeling well at [place]. Patient was well 3AM when she woke up coughing. She notes the cough was harsh and "the phlegm was too thick, I couldn't cough it up".  Patient notes that she has allergies and frequently has a dry cough but this morning "it was different, I knew I wanted to cough something up but I could not".  Patient denies fevers or chills.  Denies malaise or body aches recently.  Admits to chronic sinus congestion, not changed from usual.  No change in facial pressure no new headache.  Notes she was somewhat short of breath yesterday but not as bad as this morning.  Notes the longer she coughed the more short of breath she became. ? ?Patient admits to continuing to smoke 3 cigarettes a day.  Notes "I am trying to quit".  Notes that her husband smokes 1 to 3 packs a day although they both only smoke outside, there is no smoking inside the house. ? ?ED Course: Patient was noted to be on 2.5 L O2 and satting at 90% per EMS.  She was given IV Solu-Medrol and inhaled bronchodilators on route to the hospital.  On arrival to the ED, patient was placed on BiPAP due to increased WOB. CXR was negative for pneumonia.  Patient requested to be taken off BiPAP after 1 hour on it, she was successfully weaned off and was satting well on nasal cannula.   ? ? ?ROS:  ? ?ROS  ? ?Review of Systems: ?Per HPI ? ? ?Past Medical History:  ? ?Past Medical History:  ?Diagnosis Date  ? Anxiety   ? Asthma   ? Breast cancer ([place]) 2004  ? left breast  ? Chronic back pain   ? COPD (chronic obstructive pulmonary disease) ([place])   ?  Depression   ? History of kidney surgery   ? Personal history of radiation therapy   ? PTSD (post-traumatic stress disorder)   ? ? ?Past Surgical History:  ? ?Past Surgical History:  ?Procedure Laterality Date  ? BREAST BIOPSY Left 2004  ? positive  ? BREAST BIOPSY Right   ? neg  ? BREAST LUMPECTOMY Left 2004  ? positive  ? LEFT HEART CATH AND CORONARY ANGIOGRAPHY N/A 11/06/2021  ? Procedure: LEFT HEART CATH AND CORONARY ANGIOGRAPHY;  Surgeon: Corey Skains, MD;  Location: [facility];  Service: Cardiovascular;  Laterality: N/A;  ? MASTECTOMY Left   ? MASTECTOMY    ? NEPHRECTOMY Left   ? TUBAL LIGATION    ? ? ?Social History:  ? ?Social History  ? ?Socioeconomic History  ? Marital status: Married  ?  Spouse name: Not on file  ? Number of children: 2  ? Years of education: GED  ? Highest education level: Not on file  ?Occupational History  ? Not on file  ?Tobacco Use  ? Smoking status: Every Day  ?  Packs/day: 0.50  ?  Types: Cigarettes  ?  Passive exposure: Never  ? Smokeless tobacco: Never  ?Vaping Use  ? Vaping Use: Never used  ?Substance and Sexual  Activity  ? Alcohol use: Never  ? Drug use: Never  ?  Comment: prescribed muscle relaxers and valium  ? Sexual activity: Not Currently  ?Other Topics Concern  ? Not on file  ?Social History Narrative  ? Not on file  ? ?Social Determinants of Health  ? ?Financial Resource Strain: Not on file  ?Food Insecurity: Not on file  ?Transportation Needs: Not on file  ?Physical Activity: Not on file  ?Stress: Not on file  ?Social Connections: Not on file  ?Intimate Partner Violence: Not on file  ? ? ?Allergies  ? ?Desvenlafaxine, Morphine and related, Penicillins, Prazosin, Tamoxifen, Trazodone, Clonazepam, Codeine, Duloxetine, Gabapentin, Hydroxyzine, Lorazepam, Paroxetine hcl, Sulfa antibiotics, and Cephalexin ? ?Family history:  ? ?Family History  ?Problem Relation Age of Onset  ? Breast cancer Cousin   ? Breast cancer Cousin   ? Bipolar disorder Daughter   ? Drug  abuse Daughter   ? Alcohol abuse Maternal Grandfather   ? ? ?Current Medications:  ? ?Prior to Admission medications   ?Medication Sig Start Date End Date Taking? Authorizing Provider  ?albuterol (VENTOLIN HFA) 108 (90 Base) MCG/ACT inhaler Inhale 1-2 puffs into the lungs every 4 (four) hours as needed for cough or wheezing.    [provider]  ?cholecalciferol (VITAMIN D3) 25 MCG (1000 UNIT) tablet Take 1,000 Units by mouth daily.    [provider]  ?citalopram (CELEXA) 10 MG tablet Take 1 tablet (10 mg total) by mouth daily. 02/20/22   Ursula Alert, MD  ?cyclobenzaprine (FLEXERIL) 10 MG tablet Take 1 tablet (10 mg total) by mouth 3 (three) times daily as needed for muscle spasms. 03/06/22   Gillis Santa, MD  ?denosumab (PROLIA) 60 MG/ML SOSY injection Inject 60 mg into the skin every 6 (six) months.    [provider]  ?diazepam (VALIUM) 2 MG tablet Take 1 tablet (2 mg total) by mouth daily as needed for anxiety. For severe panic symptoms 01/29/22   Ursula Alert, MD  ?EPINEPHrine 0.3 mg/0.3 mL IJ SOAJ injection Inject 0.3 mg into the muscle as needed for anaphylaxis.    [provider]  ?fluticasone (FLONASE) 50 MCG/ACT nasal spray Place 2 sprays into both nostrils daily. 10/23/21   Pokhrel, Corrie Mckusick, MD  ?Fluticasone-Umeclidin-Vilant (TRELEGY ELLIPTA) 100-62.5-25 MCG/INH AEPB Inhale 1 puff into the lungs daily.    [provider]  ?guaiFENesin-dextromethorphan (ROBITUSSIN DM) 100-10 MG/5ML syrup Take 10 mLs by mouth every 4 (four) hours as needed for cough. 02/14/22   Lorella Nimrod, MD  ?ipratropium (ATROVENT) 0.02 % nebulizer solution Take 0.25 mg by nebulization 4 (four) times daily.    [provider]  ?mirtazapine (REMERON) 15 MG tablet Take 1 tablet (15 mg total) by mouth at bedtime. 02/20/22   Ursula Alert, MD  ?QUEtiapine (SEROQUEL) 200 MG tablet Take 1 tablet (200 mg total) by mouth at bedtime. 02/20/22   Ursula Alert, MD  ?vitamin B-12  (CYANOCOBALAMIN) 500 MCG tablet Take 500 mcg by mouth daily.    [provider]  ? ? ?Physical Exam:  ? ?Vitals:  ? 03/09/22 1054 03/09/22 1055 03/09/22 1208 03/09/22 1209  ?BP:  (!) 126/99 119/77   ?Pulse:  (!) 106 (!) 107 (!) 106  ?Resp:  (!) 29 (!) 32 (!) 25  ?Temp:  98.4 ?F (36.9 ?C)    ?TempSrc:  Axillary    ?SpO2:  100% 99% 99%  ?Weight: 54.9 kg     ? ? ? ?Physical Exam: ?Blood pressure 119/77, pulse (!) 106,  temperature 98.4 ?F (36.9 ?C), temperature source Axillary, resp. rate (!) 25, weight 54.9 kg, SpO2 99 %. ?Gen: Thin female lying at 30 degrees with shallow tachypnea, unlabored, able to speak in full sentences with short breaths in between. ?Eyes: sclera anicteric, conjuctiva mildly injected bilaterally ?CVS: S1-S2, regulary, no gallops ?Respiratory:  decreased air entry bilaterally with some scattered low pitched wheezes in both lung fields. ?GI: NABS, soft, NT  ?LE: No edema. No cyanosis ?Neuro: A/O x 3, Moving all extremities, grossly nonfocal.  ?Psych:  mood and affect appropriate to situation. ? ? ?Data Review:  ? ? ?Labs: ?Basic Metabolic Panel: ?Recent Labs  ?Lab 03/09/22 ?1056  ?NA 134*  ?K 4.5  ?CL 96*  ?CO2 30  ?GLUCOSE 104*  ?BUN 8  ?CREATININE 0.52  ?CALCIUM 9.5  ? ?Liver Function Tests: ?Recent Labs  ?Lab 03/09/22 ?1056  ?AST 16  ?ALT 15  ?ALKPHOS 49  ?BILITOT 0.5  ?PROT 7.2  ?ALBUMIN 4.1  ? ?No results for input(s): LIPASE, AMYLASE in the last 168 hours. ?No results for input(s): AMMONIA in the last 168 hours. ?CBC: ?Recent Labs  ?Lab 03/09/22 ?1056  ?WBC 7.5  ?HGB 14.3  ?HCT 44.5  ?MCV 94.1  ?PLT 356  ? ?Cardiac Enzymes: ?No results for input(s): CKTOTAL, CKMB, CKMBINDEX, TROPONINI in the last 168 hours. ? ?BNP (last 3 results) ?No results for input(s): PROBNP in the last 8760 hours. ?CBG: ?No results for input(s): GLUCAP in the last 168 hours. ? ?Urinalysis ?   ?Component Value Date/Time  ? COLORURINE YELLOW (A) 10/12/2018 0654  ? APPEARANCEUR CLEAR (A) 10/12/2018 0654  ? LABSPEC  1.015 10/12/2018 0654  ? PHURINE 6.0 10/12/2018 0654  ? GLUCOSEU 50 (A) 10/12/2018 0654  ? [place] NEGATIVE 10/12/2018 0654  ? [place] NEGATIVE 10/12/2018 0654  ? KETONESUR 20 (A) 10/12/2018 0654  ? PROTEINUR N

## 2022-03-09 NOTE — ED Notes (Signed)
Pt stating their anxiety it increasing ?

## 2022-03-09 NOTE — ED Notes (Signed)
Attempted to call dietary for patient lunch tray ?

## 2022-03-09 NOTE — ED Notes (Signed)
Pt given [place] sandwich and cereal with milk. ?

## 2022-03-10 ENCOUNTER — Telehealth: Payer: Self-pay

## 2022-03-10 ENCOUNTER — Inpatient Hospital Stay (HOSPITAL_BASED_OUTPATIENT_CLINIC_OR_DEPARTMENT_OTHER): Admitting: Student in an Organized Health Care Education/Training Program

## 2022-03-10 DIAGNOSIS — M47812 Spondylosis without myelopathy or radiculopathy, cervical region: Secondary | ICD-10-CM

## 2022-03-10 DIAGNOSIS — J441 Chronic obstructive pulmonary disease with (acute) exacerbation: Secondary | ICD-10-CM | POA: Diagnosis not present

## 2022-03-10 DIAGNOSIS — I2510 Atherosclerotic heart disease of native coronary artery without angina pectoris: Secondary | ICD-10-CM

## 2022-03-10 DIAGNOSIS — Z853 Personal history of malignant neoplasm of breast: Secondary | ICD-10-CM

## 2022-03-10 DIAGNOSIS — M7918 Myalgia, other site: Secondary | ICD-10-CM

## 2022-03-10 DIAGNOSIS — Z9981 Dependence on supplemental oxygen: Secondary | ICD-10-CM

## 2022-03-10 LAB — BASIC METABOLIC PANEL WITH GFR
Anion gap: 4 — ABNORMAL LOW (ref 5–15)
BUN: 9 mg/dL (ref 8–23)
CO2: 27 mmol/L (ref 22–32)
Calcium: 8 mg/dL — ABNORMAL LOW (ref 8.9–10.3)
Chloride: 107 mmol/L (ref 98–111)
Creatinine, Ser: 0.36 mg/dL — ABNORMAL LOW (ref 0.44–1.00)
GFR, Estimated: >60 mL/min
Glucose, Bld: 100 mg/dL — ABNORMAL HIGH (ref 70–99)
Potassium: 4 mmol/L (ref 3.5–5.1)
Sodium: 138 mmol/L (ref 135–145)

## 2022-03-10 LAB — EXPECTORATED SPUTUM ASSESSMENT W GRAM STAIN, RFLX TO RESP C

## 2022-03-10 LAB — CBC
HCT: 39.1 % (ref 36.0–46.0)
Hemoglobin: 12.5 g/dL (ref 12.0–15.0)
MCH: 29.8 pg (ref 26.0–34.0)
MCHC: 32 g/dL (ref 30.0–36.0)
MCV: 93.3 fL (ref 80.0–100.0)
Platelets: 207 K/uL (ref 150–400)
RBC: 4.19 MIL/uL (ref 3.87–5.11)
RDW: 15.5 % (ref 11.5–15.5)
WBC: 6.9 K/uL (ref 4.0–10.5)
nRBC: 0 % (ref 0.0–0.2)

## 2022-03-10 MED ORDER — DIAZEPAM 2 MG PO TABS
2.0000 mg | ORAL_TABLET | Freq: Once | ORAL | Status: AC
  Administered 2022-03-10: 2 mg via ORAL
  Filled 2022-03-10: qty 1

## 2022-03-10 MED ORDER — ADULT MULTIVITAMIN W/MINERALS CH
1.0000 | ORAL_TABLET | Freq: Every day | ORAL | Status: DC
  Administered 2022-03-11: 1 via ORAL
  Filled 2022-03-10: qty 1

## 2022-03-10 MED ORDER — ENSURE ENLIVE PO LIQD
237.0000 mL | Freq: Two times a day (BID) | ORAL | Status: DC
  Administered 2022-03-10 – 2022-03-11 (×2): 237 mL via ORAL

## 2022-03-10 MED ORDER — LEVOFLOXACIN 500 MG PO TABS
750.0000 mg | ORAL_TABLET | Freq: Every day | ORAL | Status: DC
  Administered 2022-03-10 – 2022-03-11 (×2): 750 mg via ORAL
  Filled 2022-03-10 (×2): qty 2

## 2022-03-10 MED ORDER — GUAIFENESIN-DM 100-10 MG/5ML PO SYRP
5.0000 mL | ORAL_SOLUTION | ORAL | Status: DC | PRN
  Administered 2022-03-11: 5 mL via ORAL
  Filled 2022-03-10: qty 10

## 2022-03-10 NOTE — Telephone Encounter (Signed)
Too soon to refill this script. ?

## 2022-03-10 NOTE — Progress Notes (Signed)
PT Cancellation Note ? ?Patient Details ?Name: Colleen [name] ?MRN: [id number] ?DOB: 03/16/1961 ? ? ?Cancelled Treatment:    Reason Eval/Treat Not Completed: Other (comment). Orders received and chart reviewed. Upon entry to room pt frequently sitting upright in bed then lying in supine appearing anxious and uncomfortable. Upon questioning, pt endorsing anxiety attack. RN notified. PT assessed SPO2 and HR. Spo2 at 90-92% and HR at 106 to 107 BPM. Pt reporting all needs in reach. Aware of call bell if worsening of symptoms. PT to hold until pt medically appropriate. Will re-attempt as able. ? ? ?Salem Caster. Fairly IV, PT, DPT ?Physical Therapist- [place]  ?[facility]  ?03/10/2022, 2:41 PM ?

## 2022-03-10 NOTE — Telephone Encounter (Signed)
received fax requesting a 90 day supply of the citalopram  ?

## 2022-03-10 NOTE — Progress Notes (Signed)
?PROGRESS NOTE ? ? ? Colleen [name]  LFY:[id number] DOB: May 23, 1961 DOA: 03/09/2022 ?PCP: Remi Haggard, FNP  ?Outpatient Specialists: ENT ? ? ? ?Brief Narrative:  ? ?From admission h and p ?Colleen [name] is an 61 y.o. female with COPD and ongoing tobacco use who was discahrged 12 days ago after treatment for COPD flare. Patient was feeling well at [place]. Patient was well 3AM when she woke up coughing. She notes the cough was harsh and "the phlegm was too thick, I couldn't cough it up".  Patient notes that she has allergies and frequently has a dry cough but this morning "it was different, I knew I wanted to cough something up but I could not".  Patient denies fevers or chills.  Denies malaise or body aches recently.  Admits to chronic sinus congestion, not changed from usual.  No change in facial pressure no new headache.  Notes she was somewhat short of breath yesterday but not as bad as this morning.  Notes the longer she coughed the more short of breath she became. ?  ?Patient admits to continuing to smoke 3 cigarettes a day.  Notes "I am trying to quit".  Notes that her husband smokes 1 to 3 packs a day although they both only smoke outside, there is no smoking inside the house. ? ? ?Assessment & Plan: ?  ?Active Problems: ?  Major depressive disorder, recurrent severe without psychotic features ([place]) ?  GAD (generalized anxiety disorder) ?  Tobacco use disorder ?  COPD with acute exacerbation ([place]) ?  Coronary artery disease ?  Dependence on nocturnal oxygen therapy ?  History of breast cancer ? ? ?# Acute hypoxic respiratory failure on chronic hypoxic respiratory failure ?On 2 L at night and prn at home. For increased wob on presentation was placed on bipap, successfully weaned about an hour later. O2 currently around 91 on 2 L ?- cont [place] O2, wean as able ? ?# COPD with acute exacerbation ?Anxiety/panic seems to be playing a role. CTA on 4/15 neg for PE. Recent admit for copd. Cxr clear.  ?- continue  steroids, levofloxacin (given recent antibiotics) ?- sputum for culture ?- nebulizers ?- PT eval ?- possible d/c tomorrow ?- will need pulm referral @ d/c ? ?# Tobacco abuse ?Brief intervention performed by admitting provider ? ?# GAD/Panic ?Contributes to symptomatology  ?- cont home seroquel, mirtazapine, valium, celexa ? ? ?DVT prophylaxis: lovenox ?Code Status: full ?Family Communication: none @ bedside. No answer when husband called ? ?Level of care: Progressive ?Status is: Inpatient ?Remains inpatient appropriate because: severity of illness ? ? ? ?Consultants:  ?none ? ?Procedures: ?none ? ?Antimicrobials:  ?Levofloxacin 4/30>  ? ? ?Subjective: ?Remains sob but says improving. Cough somewhat productive. No fever. Tolerating diet ? ?Objective: ?Vitals:  ? 03/10/22 0142 03/10/22 8527 03/10/22 0745 03/10/22 0751  ?BP:  107/74  129/81  ?Pulse:  96  99  ?Resp:  16  18  ?Temp:  97.9 ?F (36.6 ?C)  (!) 97.5 ?F (36.4 ?C)  ?TempSrc:      ?SpO2: 99% 99% 97% 100%  ?Weight:      ?Height:      ? ? ?Intake/Output Summary (Last 24 hours) at 03/10/2022 0851 ?Last data filed at 03/10/2022 0324 ?Gross per 24 hour  ?Intake 700 ml  ?Output --  ?Net 700 ml  ? ?Filed Weights  ? 03/09/22 1054 03/09/22 2010  ?Weight: 54.9 kg 52.6 kg  ? ? ?Examination: ? ?General exam: Appears calm and comfortable  ?  Respiratory system: distant sounds, few scatt wheeze and rhonchi ?Cardiovascular system: S1 & S2 heard, RRR. No JVD, murmurs, rubs, gallops or clicks. No pedal edema. ?Gastrointestinal system: Abdomen is nondistended, soft and nontender. No organomegaly or masses felt. Normal bowel sounds heard. ?Central nervous system: Alert and oriented. No focal neurological deficits. ?Extremities: Symmetric 5 x 5 power. ?Skin: No rashes, lesions or ulcers ?Psychiatry: Judgement and insight appear normal. Mood & affect appropriate.  ? ? ? ?Data Reviewed: I have personally reviewed following labs and imaging studies ? ?CBC: ?Recent Labs  ?Lab 03/09/22 ?1056  03/09/22 ?2019 03/10/22 ?0603  ?WBC 7.5 5.4 6.9  ?HGB 14.3 14.3 12.5  ?HCT 44.5 45.5 39.1  ?MCV 94.1 94.2 93.3  ?PLT 356 316 207  ? ?Basic Metabolic Panel: ?Recent Labs  ?Lab 03/09/22 ?1056 03/10/22 ?1610  ?NA 134* 138  ?K 4.5 4.0  ?CL 96* 107  ?CO2 30 27  ?GLUCOSE 104* 100*  ?BUN 8 9  ?CREATININE 0.52 0.36*  ?CALCIUM 9.5 8.0*  ? ?GFR: ?Estimated Creatinine Clearance: 53 mL/min (A) (by C-G formula based on SCr of 0.36 mg/dL (L)). ?Liver Function Tests: ?Recent Labs  ?Lab 03/09/22 ?1056  ?AST 16  ?ALT 15  ?ALKPHOS 49  ?BILITOT 0.5  ?PROT 7.2  ?ALBUMIN 4.1  ? ?No results for input(s): LIPASE, AMYLASE in the last 168 hours. ?No results for input(s): AMMONIA in the last 168 hours. ?Coagulation Profile: ?No results for input(s): INR, PROTIME in the last 168 hours. ?Cardiac Enzymes: ?No results for input(s): CKTOTAL, CKMB, CKMBINDEX, TROPONINI in the last 168 hours. ?BNP (last 3 results) ?No results for input(s): PROBNP in the last 8760 hours. ?HbA1C: ?No results for input(s): HGBA1C in the last 72 hours. ?CBG: ?No results for input(s): GLUCAP in the last 168 hours. ?Lipid Profile: ?No results for input(s): CHOL, HDL, LDLCALC, TRIG, CHOLHDL, LDLDIRECT in the last 72 hours. ?Thyroid Function Tests: ?No results for input(s): TSH, T4TOTAL, FREET4, T3FREE, THYROIDAB in the last 72 hours. ?Anemia Panel: ?No results for input(s): VITAMINB12, FOLATE, FERRITIN, TIBC, IRON, RETICCTPCT in the last 72 hours. ?Urine analysis: ?   ?Component Value Date/Time  ? COLORURINE YELLOW (A) 10/12/2018 0654  ? APPEARANCEUR CLEAR (A) 10/12/2018 0654  ? LABSPEC 1.015 10/12/2018 0654  ? PHURINE 6.0 10/12/2018 0654  ? GLUCOSEU 50 (A) 10/12/2018 0654  ? [place] NEGATIVE 10/12/2018 0654  ? [place] NEGATIVE 10/12/2018 0654  ? KETONESUR 20 (A) 10/12/2018 0654  ? [place] NEGATIVE 10/12/2018 0654  ? NITRITE NEGATIVE 10/12/2018 0654  ? LEUKOCYTESUR TRACE (A) 10/12/2018 0654  ? ?Sepsis Labs: ?'@LABRCNTIP'$ (procalcitonin:4,lacticidven:4) ? ?) ?Recent  Results (from the past 240 hour(s))  ?Resp Panel by RT-PCR (Flu A&B, Covid) Nasopharyngeal Swab     Status: None  ? Collection Time: 03/09/22 11:20 AM  ? Specimen: Nasopharyngeal Swab; Nasopharyngeal(NP) swabs in vial transport medium  ?Result Value Ref Range Status  ? SARS Coronavirus 2 by RT PCR NEGATIVE NEGATIVE Final  ?  Comment: (NOTE) ?SARS-CoV-2 target nucleic acids are NOT DETECTED. ? ?The SARS-CoV-2 RNA is generally detectable in upper respiratory ?specimens during the acute phase of infection. The lowest ?concentration of SARS-CoV-2 viral copies this assay can detect is ?138 copies/mL. A negative result does not preclude SARS-Cov-2 ?infection and should not be used as the sole basis for treatment or ?other patient management decisions. A negative result may occur with  ?improper specimen collection/handling, submission of specimen other ?than nasopharyngeal swab, presence of viral mutation(s) within the ?areas targeted by this assay, and inadequate number  of viral ?copies(<138 copies/mL). A negative result must be combined with ?clinical observations, patient history, and epidemiological ?information. The expected result is Negative. ? ?Fact Sheet for Patients:  ?EntrepreneurPulse.com.au ? ?Fact Sheet for Healthcare Providers:  ?IncredibleEmployment.be ? ?This test is no t yet approved or cleared by the [place] FDA and  ?has been authorized for detection and/or diagnosis of SARS-CoV-2 by ?FDA under an Emergency Use Authorization (EUA). This EUA will remain  ?in effect (meaning this test can be used) for the duration of the ?COVID-19 declaration under Section 564(b)(1) of the Act, 21 ?U.S.C.section 360bbb-3(b)(1), unless the authorization is terminated  ?or revoked sooner.  ? ? ?  ? Influenza A by PCR NEGATIVE NEGATIVE Final  ? Influenza B by PCR NEGATIVE NEGATIVE Final  ?  Comment: (NOTE) ?The Xpert Xpress SARS-CoV-2/FLU/RSV plus assay is intended as an aid ?in the  diagnosis of influenza from Nasopharyngeal swab specimens and ?should not be used as a sole basis for treatment. Nasal washings and ?aspirates are unacceptable for Xpert Xpress SARS-CoV-2/FLU/RSV ?testing.

## 2022-03-10 NOTE — Progress Notes (Signed)
Initial Nutrition Assessment ? ?DOCUMENTATION CODES:  ? ?Not applicable ? ?INTERVENTION:  ? ?-Ensure Enlive po BID, each supplement provides 350 kcal and 20 grams of protein ?-MVI with minerals daily ? ?NUTRITION DIAGNOSIS:  ? ?Increased nutrient needs related to chronic illness (COPD) as evidenced by estimated needs. ? ?GOAL:  ? ?Patient will meet greater than or equal to 90% of their needs ? ?MONITOR:  ? ?PO intake, Supplement acceptance, Labs, Weight trends, Skin, I & O's ? ?REASON FOR ASSESSMENT:  ? ?Malnutrition Screening Tool ?  ? ?ASSESSMENT:  ? ?Colleen [name] is an 61 y.o. female with COPD and ongoing tobacco use who was discahrged 12 days ago after treatment for COPD flare. Patient was feeling well at [place]. Patient was well 3AM when she woke up coughing. She notes the cough was harsh and "the phlegm was too thick, I couldn't cough it up".  Patient notes that she has allergies and frequently has a dry cough but this morning "it was different, I knew I wanted to cough something up but I could not".  Patient denies fevers or chills.  Denies malaise or body aches recently.  Admits to chronic sinus congestion, not changed from usual.  No change in facial pressure no new headache.  Notes she was somewhat short of breath yesterday but not as bad as this morning.  Notes the longer she coughed the more short of breath she became. ? ?Pt admitted with COPD exacerbation.  ? ?Reviewed I/O's: +700 ml x 24 hours ? ?Spoke with pt at bedside, who reports feeling "okay" today. Pt complained of being cold and RD adjusted thermostat for pt.  ? ?Per pt, she has had poor appetite over the past several months related to increased depression, stress, and anxiety. She shares that she takes medication for depression and anxiety and this "sometimes" helps. Pt has an erratic meal pattern as her husband works odd shifts. She usually consumes 1-2 meals per day; meals are easy to prepare (sandwiches or cereal) as pt reports she is  unable to stand for long periods of time due to leg weakness and fear of falling.  ? ?Pt tolerating meals well here; consumed 100% of breakfast and lunch.  ? ?Pt endorses a 20# wt loss over the past few months. Reviewed wt hx; pt has experinced a 12.3% wt loss over the past 3 months, which is significant for time frame.  ? ?Discussed importance of good meal and supplement intake to promote healing. Pt amenable to supplements.  ?  ?Medication reviewed and include remeron and prednisone.  ? ?Lab Results  ?Component Value Date  ? HGBA1C 5.8 (H) 12/18/2021  ? PTA DM medications are none.  ? ?Labs reviewed: CBGS: 101 (inpatient orders for glycemic control are none).   ? ?NUTRITION - FOCUSED PHYSICAL EXAM: ? ?Flowsheet Row Most Recent Value  ?Orbital Region No depletion  ?Upper Arm Region No depletion  ?Thoracic and Lumbar Region No depletion  ?Buccal Region No depletion  ?Temple Region No depletion  ?Clavicle Bone Region Mild depletion  ?Clavicle and Acromion Bone Region Mild depletion  ?Scapular Bone Region Mild depletion  ?Dorsal Hand No depletion  ?Patellar Region No depletion  ?Anterior Thigh Region No depletion  ?Posterior Calf Region No depletion  ?Edema (RD Assessment) None  ?Hair Reviewed  ?Eyes Reviewed  ?Mouth Reviewed  ?Skin Reviewed  ?Nails Reviewed  ? ?  ? ? ?Diet Order:   ?Diet Order   ? ?       ?  Diet regular Room service appropriate? Yes; Fluid consistency: Thin  Diet effective now       ?  ? ?  ?  ? ?  ? ? ?EDUCATION NEEDS:  ? ?Education needs have been addressed ? ?Skin:  Skin Assessment: Reviewed RN Assessment ? ?Last BM:  Unknown ? ?Height:  ? ?Ht Readings from Last 1 Encounters:  ?03/09/22 5' (1.524 m)  ? ? ?Weight:  ? ?Wt Readings from Last 1 Encounters:  ?03/09/22 52.6 kg  ? ? ?Ideal Body Weight:  45.5 kg ? ?BMI:  Body mass index is 22.65 kg/m?. ? ?Estimated Nutritional Needs:  ? ?Kcal:  1600-1800 ? ?Protein:  80-95 grams ? ?Fluid:  > 1.6 L ? ? ? ?Loistine Chance, RD, LDN, CDCES ?Registered Dietitian  II ?Certified Diabetes Care and Education Specialist ?Please refer to [facility] for RD and/or RD on-call/weekend/after hours pager  ?

## 2022-03-10 NOTE — Progress Notes (Signed)
Upon review of medical records, patient is admitted to hospital for COPD exacerbation.  I was unable to reach her by phone today.  Please have patient follow-up after she is released from the hospital and over her acute COPD exacerbation. ? ? ?Post-procedure evaluation  ? Type: Cervical Facet Medial Branch Block(s) #1  ?Laterality: Bilateral  ?Level: C3, C4, C5, C6, Medial Branch Level(s). Injecting these levels blocks the C3-4, C4-5, C5-6, cervical facet joints.  ?Imaging: Fluoroscopic guidance ?Anesthesia: Local anesthesia (1-2% Lidocaine) ?Anxiolysis: None                 ?Sedation: None. ?DOS: 01/15/2022  ?Performed by: Gillis Santa, MD ? ?Purpose: Diagnostic/Therapeutic ?Indications: Cervicalgia (cervical spine axial pain) severe enough to impact quality of life or function. ?1. Cervical facet joint syndrome (C3/4, C4/5)   ?2. Chronic pain syndrome   ? ?NAS-11 Pain score:  ? Pre-procedure: 10-Worst pain ever/10  ? Post-procedure: 4  (right side numb and left side @ 4)/10  ? ?   ?Effectiveness:  ?Initial hour after procedure: 80 %  ?Subsequent 4-6 hours post-procedure: 80 %  ?Analgesia past initial 6 hours: 80 % (patient reports good relief for 12 - 15 days and then the pain returned.)  ? ?

## 2022-03-10 NOTE — TOC Initial Note (Signed)
Transition of Care (TOC) - Initial/Assessment Note  ? ? ?Patient Details  ?Name: Colleen [name] ?MRN: [id number] ?Date of Birth: 1961/08/03 ? ?Transition of Care (TOC) CM/SW Contact:    ?Candie Chroman, LCSW ?Phone Number: ?03/10/2022, 9:53 AM ? ?Clinical Narrative:  Readmission prevention screen complete. CSW met with patient. No supports at bedside. CSW introduced role and explained that discharge planning would be discussed. Patient lives home with her husband. PCP is Threasa Alpha, FNP. Husband drives her to appointments. [facility] she [facility] [address] Otherwise she gets her medications from [facility]. No home health prior to admission. She uses a single-point cane as needed at home. She is on QHS prn oxygen at home through [facility]. No further concerns. CSW encouraged patient to contact CSW as needed. CSW will continue to follow patient for support and facilitate return home when stable.                ? ?Expected Discharge Plan: Home/Self Care ?Barriers to Discharge: Continued Medical Work up ? ? ?Patient Goals and CMS Choice ?  ?  ?  ? ?Expected Discharge Plan and Services ?Expected Discharge Plan: Home/Self Care ?  ?  ?Post Acute Care Choice: NA ?Living arrangements for the past 2 months: [place] ?                ?  ?  ?  ?  ?  ?  ?  ?  ?  ?  ? ?Prior Living Arrangements/Services ?Living arrangements for the past 2 months: [place] ?Lives with:: Spouse ?Patient language and need for interpreter reviewed:: Yes ?Do you feel safe going back to the place where you live?: Yes      ?Need for Family Participation in Patient Care: Yes (Comment) ?Care giver support system in place?: Yes (comment) ?Current home services: DME ?Criminal Activity/Legal Involvement Pertinent to Current Situation/Hospitalization: No - Comment as needed ? ?Activities of Daily Living ?Home Assistive Devices/Equipment: Oxygen (PRN O2) ?ADL Screening (condition at time of  admission) ?Patient's cognitive ability adequate to safely complete daily activities?: Yes ?Is the patient deaf or have difficulty hearing?: No ?Does the patient have difficulty seeing, even when wearing glasses/contacts?: No ?Does the patient have difficulty concentrating, remembering, or making decisions?: No ?Patient able to express need for assistance with ADLs?: Yes ?Does the patient have difficulty dressing or bathing?: No ?Independently performs ADLs?: Yes (appropriate for developmental age) ?Does the patient have difficulty walking or climbing stairs?: Yes ?Weakness of Legs: None ?Weakness of Arms/Hands: None ? ?Permission Sought/Granted ?  ?  ?   ?   ?   ?   ? ?Emotional Assessment ?Appearance:: Appears stated age ?Attitude/Demeanor/Rapport: Engaged, Gracious ?Affect (typically observed): Accepting, Appropriate ?Orientation: : Oriented to Self, Oriented to Place, Oriented to  Time, Oriented to Situation ?Alcohol / Substance Use: Not Applicable ?Psych Involvement: No (comment) ? ?Admission diagnosis:  COPD exacerbation ([place]) [J44.1] ?COPD with acute exacerbation ([place]) [J44.1] ?Patient Active Problem List  ? Diagnosis Date Noted  ? Coronary artery disease 03/10/2022  ? Dependence on nocturnal oxygen therapy 03/10/2022  ? History of breast cancer 03/10/2022  ? COPD with acute exacerbation ([place]) 03/09/2022  ? SOB (shortness of breath) 02/22/2022  ? Depression   ? Major depressive disorder, recurrent severe without psychotic features ([place]) 12/13/2021  ? At risk for prolonged QT interval syndrome 11/19/2021  ? Acute exacerbation of chronic obstructive pulmonary disease (COPD) ([place]) 10/20/2021  ?  Hyponatremia 10/19/2021  ? Tobacco use disorder 06/19/2021  ? Insomnia due to medical condition 03/21/2021  ? PTSD (post-traumatic stress disorder) 03/04/2021  ? Cervical myofascial pain syndrome 07/09/2020  ? Chronic pain syndrome 06/04/2020  ? Osteopenia of multiple sites 06/04/2020  ? Age related osteoporosis  06/04/2020  ? Low back pain 04/27/2020  ? Panic attack 03/20/2020  ? COPD (chronic obstructive pulmonary disease) ([place]) 10/11/2018  ? GAD (generalized anxiety disorder) 10/11/2018  ? ?PCP:  Remi Haggard, FNP ?Ph[facility]acy:   ?[id number]?Phone: [phone]: [phone]? ? ? ? ?Social Determinants of Health (SDOH) Interventions ?  ? ?Readmission Risk Interventions ? ?  03/10/2022  ?  9:51 AM 02/14/2022  ?  1:57 PM 12/06/2021  ? 10:23 AM  ?Readmission Risk Prevention Plan  ?Transportation Screening Complete Complete Complete  ?PCP or Specialist Appt within 5-7 Days   Complete  ?Medication Review (RN CM)   Complete  ?Medication Review (Press photographer Complete Complete   ?PCP or Specialist appointment within 3-5 days of discharge Complete Complete   ?SW Recovery Care/Counseling Consult Complete    ?Palliative Care Screening Not Applicable Not Applicable   ?[place] Applicable   ? ? ? ?

## 2022-03-11 DIAGNOSIS — J441 Chronic obstructive pulmonary disease with (acute) exacerbation: Secondary | ICD-10-CM | POA: Diagnosis not present

## 2022-03-11 LAB — EXPECTORATED SPUTUM ASSESSMENT W GRAM STAIN, RFLX TO RESP C

## 2022-03-11 MED ORDER — LEVOFLOXACIN 750 MG PO TABS: 750.0000 mg | ORAL_TABLET | Freq: Every day | ORAL | 0 refills | Status: DC

## 2022-03-11 MED ORDER — PREDNISONE 20 MG PO TABS: 40.0000 mg | ORAL_TABLET | Freq: Every day | ORAL | 0 refills | Status: DC

## 2022-03-11 NOTE — Evaluation (Signed)
Physical Therapy Evaluation ?Patient Details ?Name: Colleen [name] ?MRN: [id number] ?DOB: 06-18-1961 ?Today's Date: 03/11/2022 ? ?History of Present Illness ? Colleen [name] is an 61 y.o. female with COPD and ongoing tobacco use who was discahrged 12 days ago after treatment for COPD flare. Patient was feeling well at [place]. Patient was well 3AM when she woke up coughing. She notes the cough was harsh and "the phlegm was too thick, I couldn't cough it up".  Patient notes that she has allergies and frequently has a dry cough but this morning "it was different, I knew I wanted to cough something up but I could not".  Patient denies fevers or chills.  Denies malaise or body aches recently.  Admits to chronic sinus congestion, not changed from usual.  No change in facial pressure no new headache. ?  ?Clinical Impression ? Pt admitted with above diagnosis. Pt received upright in bed agreeable to PT services. She is resting on 2L/min via [place]. Denying anxiety right now which she was having yesterday. Reports she is grossly indep at baseline sometimes mod-I with SPC due to chronic LBP and LE weakness. Relies on husband for driving. Pt indep with all mobility tolerating 2x100' bouts with intermittent need for SUE support on hallway rail for minor unsteadiness. Overall reciprocal step through gait appreciated. Pt did require x1 standing rest break b/t bouts due to trialing ambulation on RA with SPO2 desat to 85%. Relying on 3L/min with ambulation to maintain >90% throughout. Pt returning to room seated in recliner with education on PLB for minor SoB. Pt seated in recliner with all needs in reach. Spo2 at 92% on 2L/min with RN notified on current O2 needs for OOB mobility. Pt at baseline function, independent with mobility. PT to sign off.  ? ?   ? ?Recommendations for follow up therapy are one component of a multi-disciplinary discharge planning process, led by the attending physician.  Recommendations may be updated based on  patient status, additional functional criteria and insurance authorization. ? ?Follow Up Recommendations No PT follow up ? ?  ?Assistance Recommended at Discharge None  ?Patient can return home with the following ? A little help with walking and/or transfers;Help with stairs or ramp for entrance;Assist for transportation;Assistance with cooking/housework ? ?  ?Equipment Recommendations None recommended by PT  ?Recommendations for Other Services ?    ?  ?Functional Status Assessment    ? ?  ?Precautions / Restrictions Precautions ?Precautions: Fall ?Restrictions ?Weight Bearing Restrictions: No  ? ?  ? ?Mobility ? Bed Mobility ?Overal bed mobility: Independent ?  ?  ?  ?  ?  ?  ?  ?Patient Response: Cooperative ? ?Transfers ?Overall transfer level: Independent ?Equipment used: None ?  ?  ?  ?  ?  ?  ?  ?  ?  ? ?Ambulation/Gait ?Ambulation/Gait assistance: Supervision ?Gait Distance (Feet): 200 Feet (standing rest b/t 100' bouts) ?Assistive device: None ?Gait Pattern/deviations: Step-through pattern, Decreased step length - right, Decreased step length - left ?  ?  ?  ?General Gait Details: Reciprocal gait with intermittent need for Ue support on railing for minor stability. ? ?Stairs ?  ?  ?  ?  ?  ? ?Wheelchair Mobility ?  ? ?Modified Rankin (Stroke Patients Only) ?  ? ?  ? ?Balance Overall balance assessment: Modified Independent ?  ?  ?  ?  ?  ?  ?  ?  ?  ?  ?  ?  ?  ?  ?  ?  ?  ?  ?   ? ? ? ?  Pertinent Vitals/Pain Pain Assessment ?Pain Assessment: Faces ?Faces Pain Scale: Hurts even more ?Pain Location: low back/sacrum ?Pain Descriptors / Indicators: Discomfort, Pressure, Grimacing ?Pain Intervention(s): Monitored during session, Repositioned  ? ? ?Home Living Family/patient expects to be discharged to:: Private residence ?Living Arrangements: Spouse/significant other ?Available Help at Discharge: Family;Available 24 hours/day ?Type of Home: House ?Home Access: Stairs to enter ?Entrance Stairs-Rails:  Right;Left ?Entrance Stairs-Number of Steps: 5 ?  ?Home Layout: One level ?Home Equipment: Kasandra Knudsen - single point ?   ?  ?Prior Function Prior Level of Function : Independent/Modified Independent ?  ?  ?  ?  ?  ?  ?Mobility Comments: no recents falls, itermittent SPC use ?ADLs Comments: SBA from spouse getting in/out of tub secondary to fear of falling ?  ? ? ?Hand Dominance  ? Dominant Hand: Right ? ?  ?Extremity/Trunk Assessment  ? Upper Extremity Assessment ?Upper Extremity Assessment: Overall WFL for tasks assessed ?  ? ?Lower Extremity Assessment ?Lower Extremity Assessment: Overall WFL for tasks assessed ?  ? ?Cervical / Trunk Assessment ?Cervical / Trunk Assessment: Normal  ?Communication  ? Communication: No difficulties  ?Cognition Arousal/Alertness: Awake/alert ?Behavior During Therapy: [facility] for tasks assessed/performed, Anxious ?Overall Cognitive Status: Within Functional Limits for tasks assessed ?  ?  ?  ?  ?  ?  ?  ?  ?  ?  ?  ?  ?  ?  ?  ?  ?  ?  ?  ? ?  ?General Comments General comments (skin integrity, edema, etc.): Trialed ambulation on RA with desaturation to 85-86%. Required standing rest and 3L/min [place] to return to >90% ? ?  ?Exercises Other Exercises ?Other Exercises: Role of PT in acute setting, PLB, O2 need with ambulation  ? ?Assessment/Plan  ?  ?PT Assessment Patient does not need any further PT services  ?PT Problem List Pain;Decreased activity tolerance ? ?   ?  ?PT Treatment Interventions     ? ?PT Goals (Current goals can be found in the Care Plan section)  ?Acute Rehab PT Goals ?Patient Stated Goal: improve cough and low back pain ?PT Goal Formulation: With patient ?Time For Goal Achievement: 03/25/22 ?Potential to Achieve Goals: Fair ? ?  ?Frequency   ?  ? ? ?Co-evaluation   ?  ?  ?  ?  ? ? ?  ?AM-PAC PT "6 Clicks" Mobility  ?Outcome Measure Help needed turning from your back to your side while in a flat bed without using bedrails?: None ?Help needed moving from lying on your back to  sitting on the side of a flat bed without using bedrails?: None ?Help needed moving to and from a bed to a chair (including a wheelchair)?: None ?Help needed standing up from a chair using your arms (e.g., wheelchair or bedside chair)?: A Little ?Help needed to walk in hospital room?: A Little ?Help needed climbing 3-5 steps with a railing? : A Little ?6 Click Score: 21 ? ?  ?End of Session Equipment Utilized During Treatment: Oxygen ?Activity Tolerance: Patient tolerated treatment well ?Patient left: in chair;with call bell/phone within reach ?Nurse Communication: Mobility status ?PT Visit Diagnosis: Muscle weakness (generalized) (M62.81) ?  ? ?Time: 1610-9604 ?PT Time Calculation (min) (ACUTE ONLY): 20 min ? ? ?Charges:   PT Evaluation ?$PT Eval Low Complexity: 1 Low ?  ?  ?   ? ?Salem Caster. Fairly IV, PT, DPT ?Physical Therapist- [place]  ?[facility]  ?03/11/2022, 10:14 AM ? ?

## 2022-03-11 NOTE — TOC Transition Note (Signed)
Transition of Care (TOC) - CM/SW Discharge Note ? ? ?Patient Details  ?Name: Colleen [name] ?MRN: [id number] ?Date of Birth: 09-06-1961 ? ?Transition of Care (TOC) CM/SW Contact:  ?Candie Chroman, LCSW ?Phone Number: ?03/11/2022, 11:30 AM ? ? ?Clinical Narrative:  Patient has orders to discharge home today. Per RN, husband brought oxygen tank from home. No further concerns. CSW signing off.  ? ?Final next level of care: Home/Self Care ?Barriers to Discharge: Barriers Resolved ? ? ?Patient Goals and CMS Choice ?  ?  ?  ? ?Discharge Placement ?  ?           ?  ?Patient to be transferred to facility by: Spouse ?  ?Patient and family notified of of transfer: 03/11/22 ? ?Discharge Plan and Services ?  ?  ?Post Acute Care Choice: NA          ?  ?  ?  ?  ?  ?  ?  ?  ?  ?  ? ?Social Determinants of Health (SDOH) Interventions ?  ? ? ?Readmission Risk Interventions ? ?  03/10/2022  ?  9:51 AM 02/14/2022  ?  1:57 PM 12/06/2021  ? 10:23 AM  ?Readmission Risk Prevention Plan  ?Transportation Screening Complete Complete Complete  ?PCP or Specialist Appt within 5-7 Days   Complete  ?Medication Review (RN CM)   Complete  ?Medication Review Press photographer) Complete Complete   ?PCP or Specialist appointment within 3-5 days of discharge Complete Complete   ?SW Recovery Care/Counseling Consult Complete    ?Palliative Care Screening Not Applicable Not Applicable   ?[place] Not Applicable Not Applicable   ? ? ? ? ? ?

## 2022-03-11 NOTE — TOC Progression Note (Addendum)
Transition of Care (TOC) - Progression Note  ? ? ?Patient Details  ?Name: Colleen [name] ?MRN: [id number] ?Date of Birth: 1961-02-06 ? ?Transition of Care (TOC) CM/SW Contact  ?Candie Chroman, LCSW ?Phone Number: ?03/11/2022, 10:13 AM ? ?Clinical Narrative:  Per PT, patient required 3 L while ambulating. Left voicemail for [place] representative. Need to see what their orders say and if she'll need updated sats/DME orders for continuous oxygen.  ? ?10:34 am: [facility] representative is calling office to see what her orders say. ? ?11:14 am: Patient needs new sats note and DME order which are in. Sent secure chat to RN to see if someone is bringing oxygen from home or if she needs one delivered from [facility]. ? ?Expected Discharge Plan: Home/Self Care ?Barriers to Discharge: Continued Medical Work up ? ?Expected Discharge Plan and Services ?Expected Discharge Plan: Home/Self Care ?  ?  ?Post Acute Care Choice: NA ?Living arrangements for the past 2 months: [place] ?Expected Discharge Date: 03/11/22               ?  ?  ?  ?  ?  ?  ?  ?  ?  ?  ? ? ?Social Determinants of Health (SDOH) Interventions ?  ? ?Readmission Risk Interventions ? ?  03/10/2022  ?  9:51 AM 02/14/2022  ?  1:57 PM 12/06/2021  ? 10:23 AM  ?Readmission Risk Prevention Plan  ?Transportation Screening Complete Complete Complete  ?PCP or Specialist Appt within 5-7 Days   Complete  ?Medication Review (RN CM)   Complete  ?Medication Review Press photographer) Complete Complete   ?PCP or Specialist appointment within 3-5 days of discharge Complete Complete   ?SW Recovery Care/Counseling Consult Complete    ?Palliative Care Screening Not Applicable Not Applicable   ?[place] Not Applicable Not Applicable   ? ? ?

## 2022-03-11 NOTE — Progress Notes (Signed)
SATURATION QUALIFICATIONS: (This note is used to comply with regulatory documentation for home oxygen) ? ?Patient Saturations on Room Air at Rest = 92% ? ?Patient Saturations on Room Air while Ambulating = 85% ? ?Patient Saturations on 3 Liters of oxygen while Ambulating = 91% ? ?

## 2022-03-11 NOTE — Discharge Summary (Signed)
Colleen [name] BSW:[id number] DOB: 11/10/1961 DOA: 03/09/2022 ? ?PCP: Remi Haggard, FNP ? ?Admit date: 03/09/2022 ?Discharge date: 03/11/2022 ? ?Time spent: 35 minutes ? ?Recommendations for Outpatient Follow-up:  ?Pcp and pulmonology f/u  ? ? ? ?Discharge Diagnoses:  ?Active Problems: ?  Major depressive disorder, recurrent severe without psychotic features ([place]) ?  GAD (generalized anxiety disorder) ?  Tobacco use disorder ?  COPD with acute exacerbation ([place]) ?  Coronary artery disease ?  Dependence on nocturnal oxygen therapy ?  History of breast cancer ? ? ?Discharge Condition: improved ? ?Diet recommendation: heart healthy ? ?Filed Weights  ? 03/09/22 1054 03/09/22 2010  ?Weight: 54.9 kg 52.6 kg  ? ? ?History of present illness:  ?From admission h and p ?Colleen [name] is an 61 y.o. female with COPD and ongoing tobacco use who was discahrged 12 days ago after treatment for COPD flare. Patient was feeling well at [place]. Patient was well 3AM when she woke up coughing. She notes the cough was harsh and "the phlegm was too thick, I couldn't cough it up".  Patient notes that she has allergies and frequently has a dry cough but this morning "it was different, I knew I wanted to cough something up but I could not".  Patient denies fevers or chills.  Denies malaise or body aches recently.  Admits to chronic sinus congestion, not changed from usual.  No change in facial pressure no new headache.  Notes she was somewhat short of breath yesterday but not as bad as this morning.  Notes the longer she coughed the more short of breath she became. ?  ?Patient admits to continuing to smoke 3 cigarettes a day.  Notes "I am trying to quit".  Notes that her husband smokes 1 to 3 packs a day although they both only smoke outside, there is no smoking inside the house. ? ?Hospital Course:  ?Patient presented with COPD exacerbation. Recent admission for such. Also acute on chronic hypoxia (on 2 L prn at home, here initially  requiring bipap, successfully weaned down to 2 L. Ambulated well with PT day of discharge. Has home O2 and will d/c on that. Treated with steroids and levaquin (given recent IV abx) and will complete a course of both. Has not established w/ pulmonology, so I have given her contact information for one. Patient's anxiety/panic disorder likely contributes to her symptomatology. Also given brief intervention about tobacco abuse. ? ?Procedures: ?none  ? ?Consultations: ?none ? ?Discharge Exam: ?Vitals:  ? 03/11/22 0748 03/11/22 0758  ?BP: 126/82   ?Pulse: 87   ?Resp: 18   ?Temp: 97.7 ?F (36.5 ?C)   ?SpO2: 96% 90%  ? ? ?General exam: Appears calm and comfortable  ?Respiratory system: distant sounds,  no wheeze or rhonchi ?Cardiovascular system: S1 & S2 heard, RRR. No JVD, murmurs, rubs, gallops or clicks. No pedal edema. ?Gastrointestinal system: Abdomen is nondistended, soft and nontender. No organomegaly or masses felt. Normal bowel sounds heard. ?Central nervous system: Alert and oriented. No focal neurological deficits. ?Extremities: Symmetric 5 x 5 power. ?Skin: No rashes, lesions or ulcers ?Psychiatry: Judgement and insight appear normal. Mood & affect appropriate.  ? ?Discharge Instructions ? ? ?Discharge Instructions   ? ? Diet - low sodium heart healthy   Complete by: As directed ?  ? Increase activity slowly   Complete by: As directed ?  ? ?  ? ?Allergies as of 03/11/2022   ? ?   Reactions  ? Desvenlafaxine Anaphylaxis  ?  Morphine And Related Anaphylaxis  ? Penicillins Anaphylaxis  ? Has patient had a PCN reaction causing immediate rash, facial/tongue/throat swelling, SOB or lightheadedness with hypotension: Yes ?Has patient had a PCN reaction causing severe rash involving mucus membranes or skin necrosis: No ?Has patient had a PCN reaction that required hospitalization: Unknown ?Has patient had a PCN reaction occurring within the last 10 years: No ?If all of the above answers are "NO", then may proceed with  Cephalosporin use.  ? Prazosin Other (See Comments)  ? Tamoxifen Anaphylaxis  ? Trazodone Anaphylaxis  ? Clonazepam   ? Codeine Swelling  ? Duloxetine Itching  ? Gabapentin Hives  ? Rash and swelling.   ? Hydroxyzine Itching  ? Lorazepam   ? Patient feels this medications makes her to sedated and wants to avoid  ? Paroxetine Hcl Hives  ? Sulfa Antibiotics Itching  ? Cephalexin Rash  ? ?  ? ?  ?Medication List  ?  ? ?TAKE these medications   ? ?albuterol 108 (90 Base) MCG/ACT inhaler ?Commonly known as: VENTOLIN HFA ?Inhale 1-2 puffs into the lungs every 4 (four) hours as needed for cough or wheezing. ?  ?cholecalciferol 25 MCG (1000 UNIT) tablet ?Commonly known as: VITAMIN D3 ?Take 1,000 Units by mouth daily. ?  ?citalopram 10 MG tablet ?Commonly known as: CELEXA ?Take 1 tablet (10 mg total) by mouth daily. ?  ?cyclobenzaprine 10 MG tablet ?Commonly known as: FLEXERIL ?Take 1 tablet (10 mg total) by mouth 3 (three) times daily as needed for muscle spasms. ?  ?denosumab 60 MG/ML Sosy injection ?Commonly known as: PROLIA ?Inject 60 mg into the skin every 6 (six) months. ?  ?diazepam 2 MG tablet ?Commonly known as: VALIUM ?Take 1 tablet (2 mg total) by mouth daily as needed for anxiety. For severe panic symptoms ?  ?EPINEPHrine 0.3 mg/0.3 mL Soaj injection ?Commonly known as: EPI-PEN ?Inject 0.3 mg into the muscle as needed for anaphylaxis. ?  ?fluticasone 50 MCG/ACT nasal spray ?Commonly known as: FLONASE ?Place 2 sprays into both nostrils daily. ?  ?guaiFENesin-dextromethorphan 100-10 MG/5ML syrup ?Commonly known as: ROBITUSSIN DM ?Take 10 mLs by mouth every 4 (four) hours as needed for cough. ?  ?ipratropium 0.02 % nebulizer solution ?Commonly known as: ATROVENT ?Take 0.25 mg by nebulization 4 (four) times daily. ?  ?levofloxacin 750 MG tablet ?Commonly known as: LEVAQUIN ?Take 1 tablet (750 mg total) by mouth daily. ?Start taking on: Mar 12, 2022 ?  ?mirtazapine 15 MG tablet ?Commonly known as: REMERON ?Take 1  tablet (15 mg total) by mouth at bedtime. ?  ?predniSONE 20 MG tablet ?Commonly known as: DELTASONE ?Take 2 tablets (40 mg total) by mouth daily with breakfast. ?Start taking on: Mar 12, 2022 ?  ?QUEtiapine 200 MG tablet ?Commonly known as: SEROQUEL ?Take 1 tablet (200 mg total) by mouth at bedtime. ?  ?Trelegy Ellipta 100-62.5-25 MCG/ACT Aepb ?Generic drug: Fluticasone-Umeclidin-Vilant ?Inhale 1 puff into the lungs daily. ?  ?vitamin B-12 500 MCG tablet ?Commonly known as: CYANOCOBALAMIN ?Take 500 mcg by mouth daily. ?  ? ?  ? ?Allergies  ?Allergen Reactions  ? Desvenlafaxine Anaphylaxis  ? Morphine And Related Anaphylaxis  ? Penicillins Anaphylaxis  ?  Has patient had a PCN reaction causing immediate rash, facial/tongue/throat swelling, SOB or lightheadedness with hypotension: Yes ?Has patient had a PCN reaction causing severe rash involving mucus membranes or skin necrosis: No ?Has patient had a PCN reaction that required hospitalization: Unknown ?Has patient had a PCN reaction occurring  within the last 10 years: No ?If all of the above answers are "NO", then may proceed with Cephalosporin use. ?  ? Prazosin Other (See Comments)  ? Tamoxifen Anaphylaxis  ? Trazodone Anaphylaxis  ? Clonazepam   ? Codeine Swelling  ? Duloxetine Itching  ? Gabapentin Hives  ?  Rash and swelling.   ? Hydroxyzine Itching  ? Lorazepam   ?  Patient feels this medications makes her to sedated and wants to avoid  ? Paroxetine Hcl Hives  ? Sulfa Antibiotics Itching  ? Cephalexin Rash  ? ? Follow-up Information   ? ? Ottie Glazier, MD Follow up.   ?Specialty: Pulmonary Disease ?Contact information: ?[address] ?[place] [address] ?[phone] ? ? ?  ?  ? ? Remi Haggard, FNP Follow up.   ?Specialty: Family Medicine ?Contact information: ?[place] ?[place] [address] ?[phone] ? ? ?  ?  ? ?  ?  ? ?  ? ? ? ?The results of significant diagnostics from this hospitalization (including imaging, microbiology,  ancillary and laboratory) are listed below for reference.   ? ?Significant Diagnostic Studies: ?DG Chest 2 View ? ?Result Date: 02/22/2022 ?CLINICAL DATA:  Shortness of breath. EXAM: CHEST - 2 VIEW COMPARISON:  02/12/22 FI

## 2022-03-14 LAB — CULTURE, RESPIRATORY W GRAM STAIN: Culture: NORMAL

## 2022-03-20 ENCOUNTER — Other Ambulatory Visit: Payer: Self-pay

## 2022-03-20 ENCOUNTER — Emergency Department
Admission: EM | Admit: 2022-03-20 | Discharge: 2022-03-20 | Disposition: A | Discharge status: Home / Self Care | Attending: Emergency Medicine | Admitting: Emergency Medicine

## 2022-03-20 ENCOUNTER — Emergency Department

## 2022-03-20 DIAGNOSIS — J441 Chronic obstructive pulmonary disease with (acute) exacerbation: Secondary | ICD-10-CM | POA: Diagnosis not present

## 2022-03-20 DIAGNOSIS — R0602 Shortness of breath: Secondary | ICD-10-CM | POA: Diagnosis present

## 2022-03-20 LAB — BASIC METABOLIC PANEL
Anion gap: 9 (ref 5–15)
BUN: 10 mg/dL (ref 8–23)
CO2: 28 mmol/L (ref 22–32)
Calcium: 9.1 mg/dL (ref 8.9–10.3)
Chloride: 95 mmol/L — ABNORMAL LOW (ref 98–111)
Creatinine, Ser: 0.44 mg/dL (ref 0.44–1.00)
GFR, Estimated: >60 mL/min (ref >60)
Glucose, Bld: 104 mg/dL — ABNORMAL HIGH (ref 70–99)
Potassium: 4.5 mmol/L (ref 3.5–5.1)
Sodium: 132 mmol/L — ABNORMAL LOW (ref 135–145)

## 2022-03-20 LAB — CBC
HCT: 41 % (ref 36.0–46.0)
Hemoglobin: 13.1 g/dL (ref 12.0–15.0)
MCH: 29.6 pg (ref 26.0–34.0)
MCHC: 32 g/dL (ref 30.0–36.0)
MCV: 92.6 fL (ref 80.0–100.0)
Platelets: 288 10*3/uL (ref 150–400)
RBC: 4.43 MIL/uL (ref 3.87–5.11)
RDW: 14.5 % (ref 11.5–15.5)
WBC: 7 10*3/uL (ref 4.0–10.5)
nRBC: 0 % (ref 0.0–0.2)

## 2022-03-20 LAB — URINALYSIS, ROUTINE W REFLEX MICROSCOPIC
Bilirubin Urine: NEGATIVE
Glucose, UA: NEGATIVE mg/dL
Hgb urine dipstick: NEGATIVE
Ketones, ur: NEGATIVE mg/dL
Leukocytes,Ua: NEGATIVE
Nitrite: NEGATIVE
Protein, ur: NEGATIVE mg/dL
Specific Gravity, Urine: 1.005 (ref 1.005–1.030)
pH: 7 (ref 5.0–8.0)

## 2022-03-20 LAB — TROPONIN I (HIGH SENSITIVITY)
Troponin I (High Sensitivity): 4 ng/L (ref <18)
Troponin I (High Sensitivity): 3 ng/L (ref <18)

## 2022-03-20 LAB — BRAIN NATRIURETIC PEPTIDE: B Natriuretic Peptide: 15.1 pg/mL (ref 0.0–100.0)

## 2022-03-20 IMAGING — CR DG CHEST 2V
1 series · 2 of 2 positions shown · non-contrast
Comparison: [DATE]; correlation CT chest [DATE]

CLINICAL DATA: Worsening shortness of breath for a couple days,
recent admission for pulmonary edema, history asthma, breast cancer,
COPD, smoker

EXAM:
CHEST - 2 VIEW

[Series 1: w chest pa · 0.14mm/px · 2 of 2 slices shown]
[im 1/2]
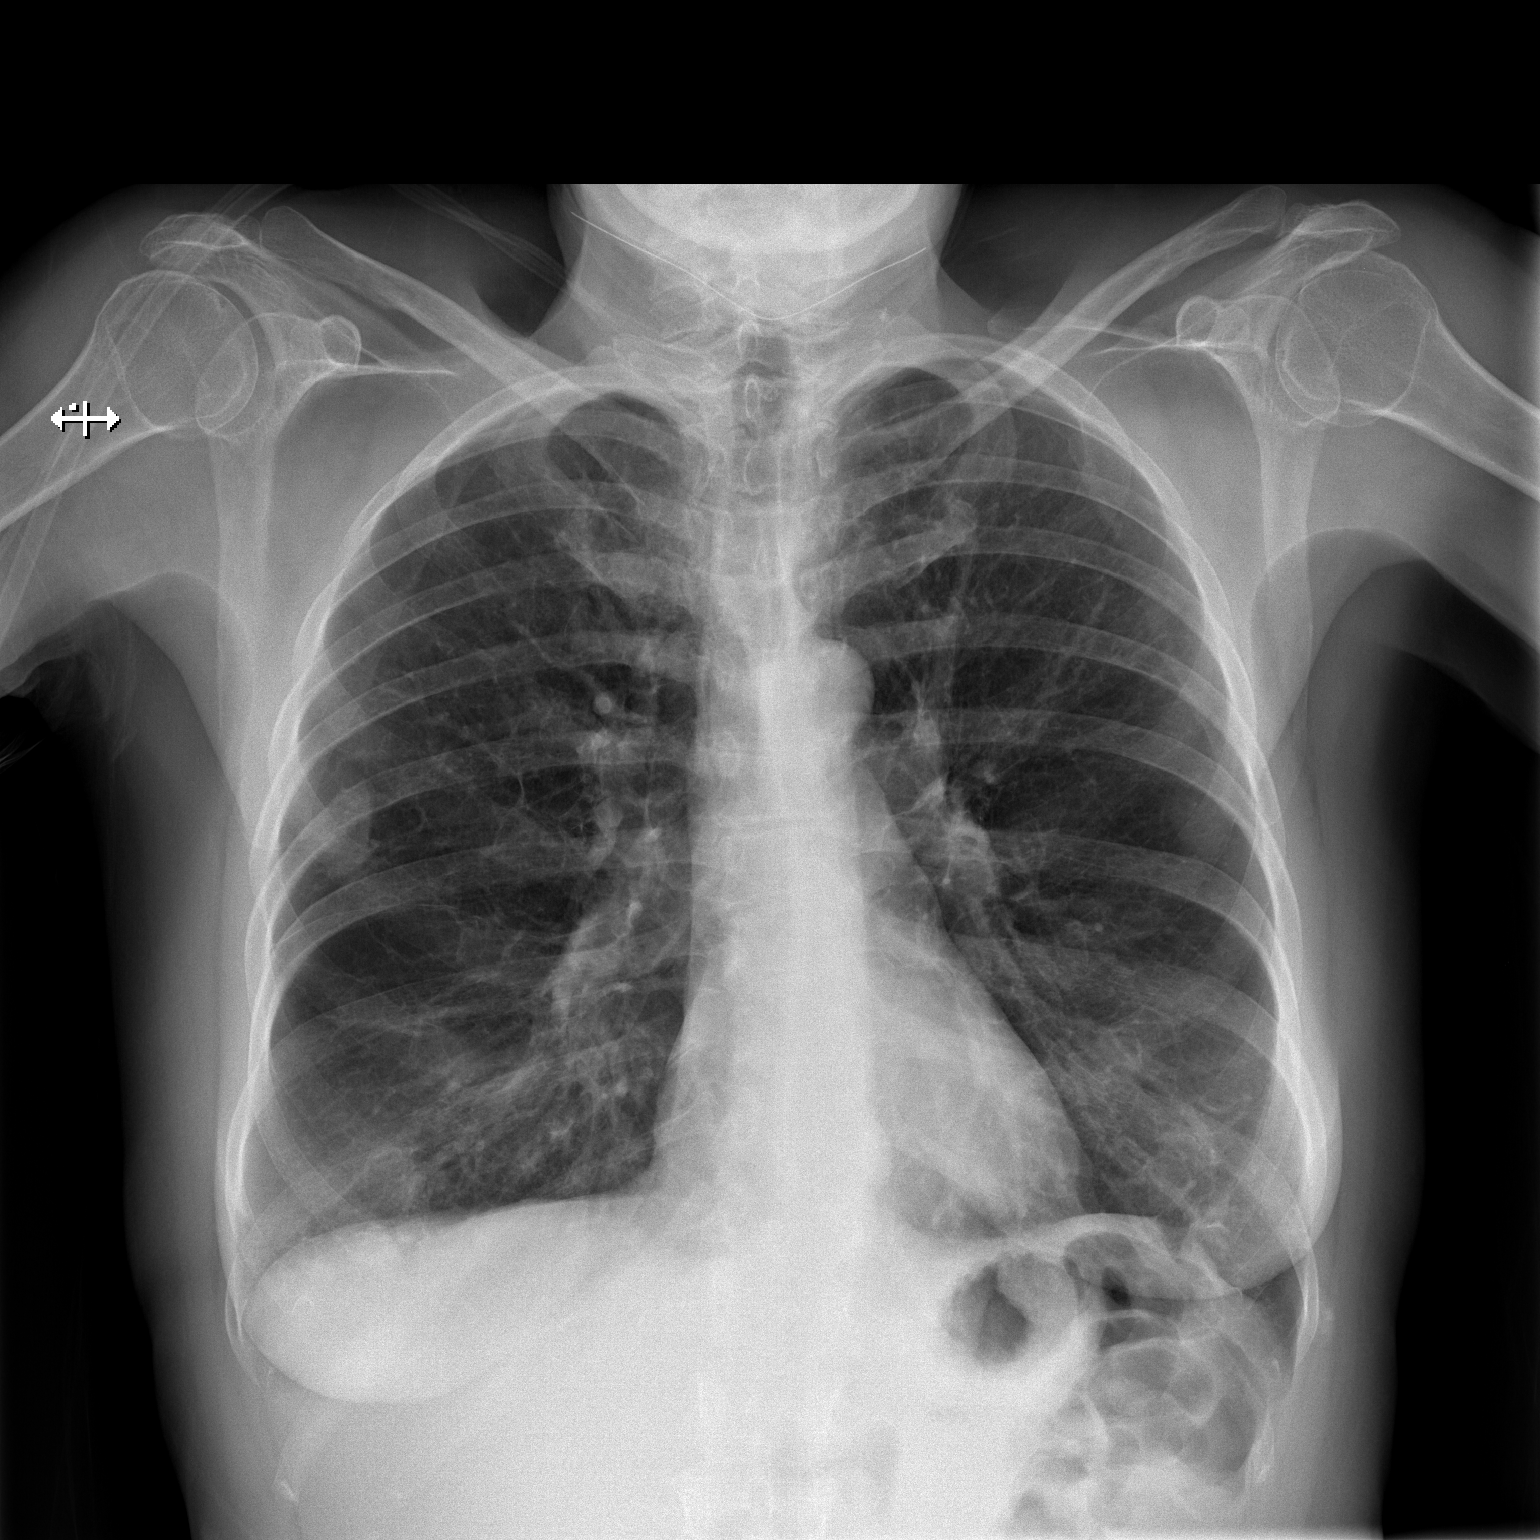
[im 2/2]
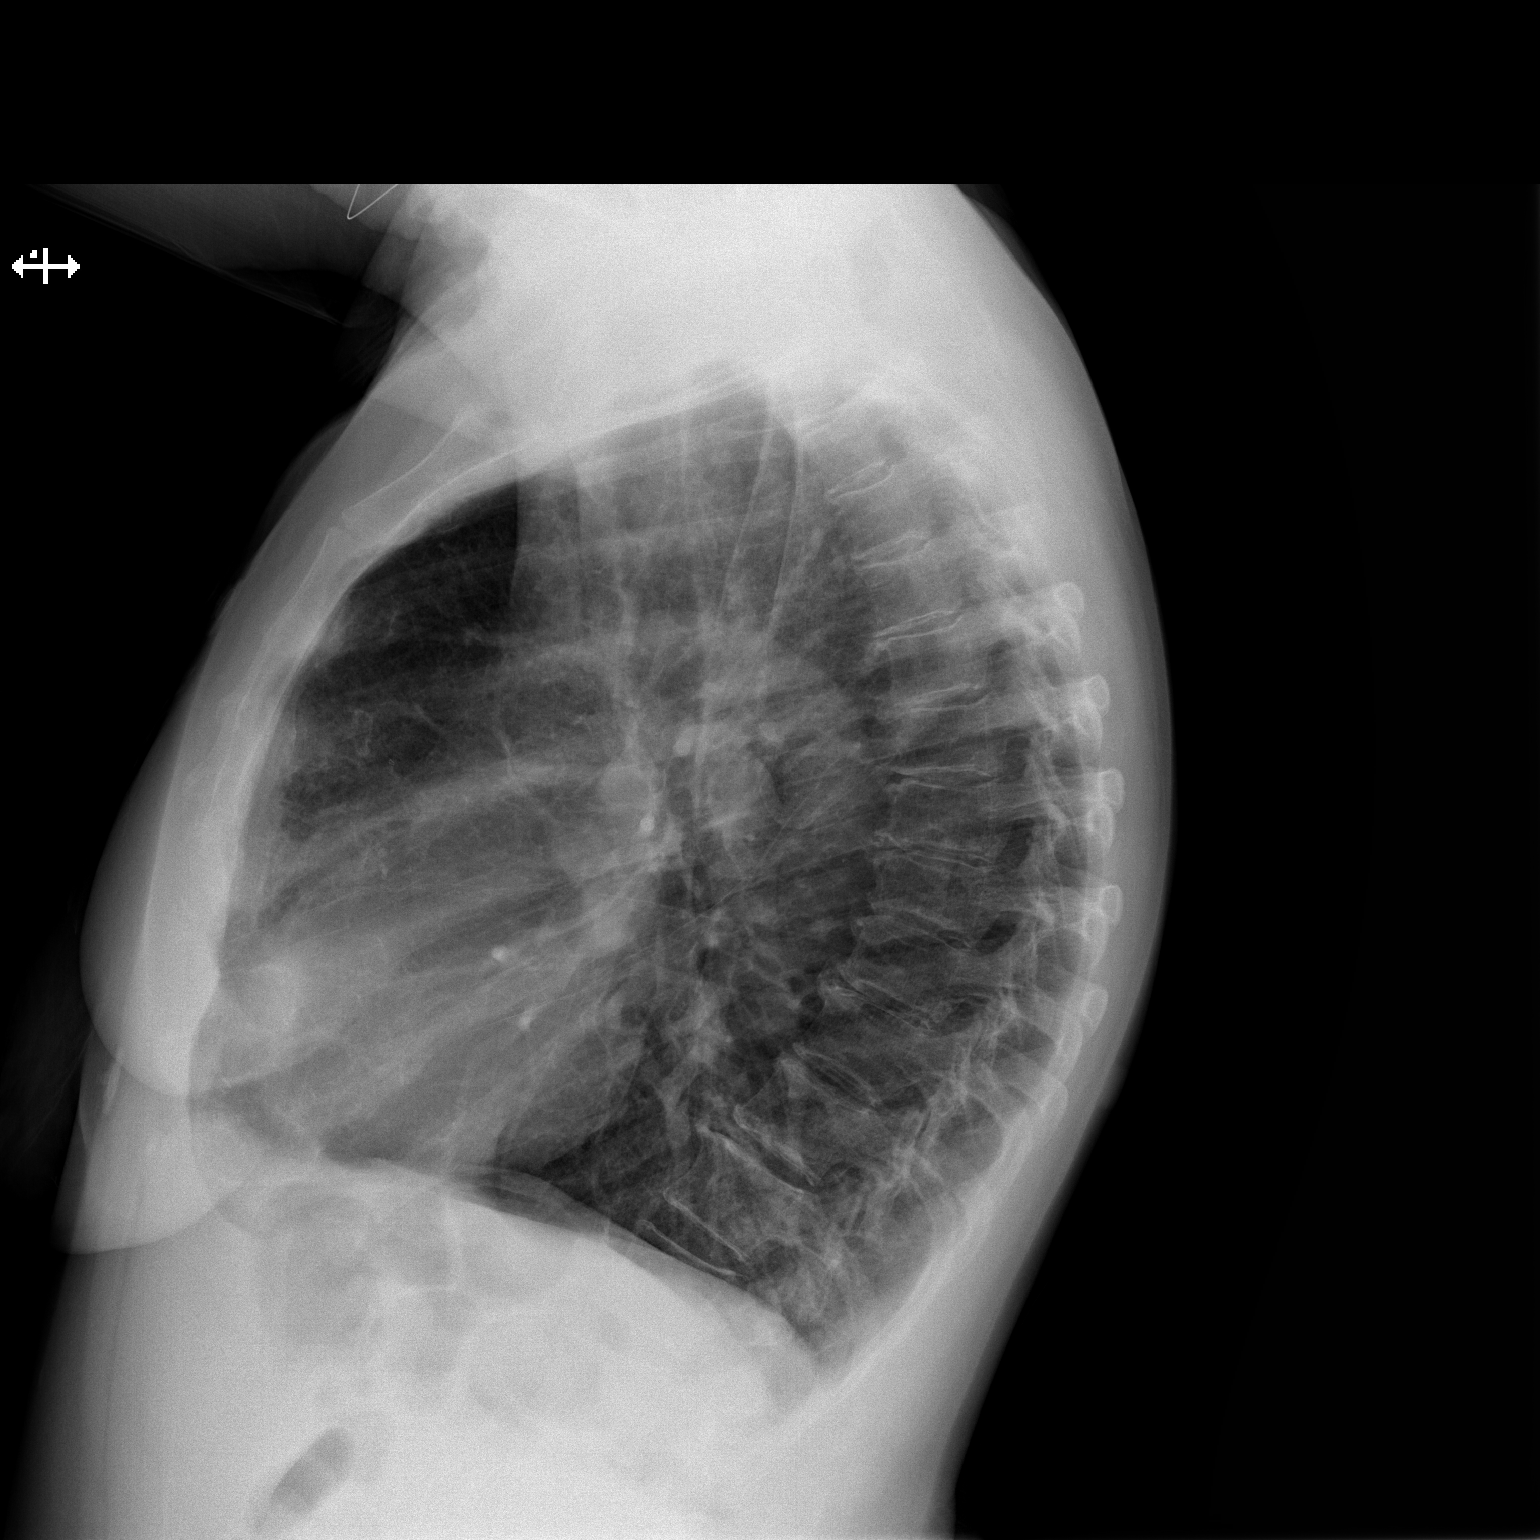

[2 of 2 positions shown; findings below may reference images not displayed]

FINDINGS: Normal heart size, mediastinal contours, and pulmonary vascularity.

Emphysematous and bronchitic changes consistent with COPD.

No acute infiltrate, pleural effusion, or pneumothorax.

Density identified mid lateral RIGHT chest from healing fracture of
the posterior RIGHT seventh rib unchanged.

Osseous demineralization.
IMPRESSION: COPD changes without acute abnormalities.

Healing RIGHT seventh rib fracture.

Emphysema ([CV]-[CV]).

## 2022-03-20 MED ORDER — IPRATROPIUM-ALBUTEROL 0.5-2.5 (3) MG/3ML IN SOLN
3.0000 mL | Freq: Once | RESPIRATORY_TRACT | Status: AC
Start: 2022-03-20 — End: 2022-03-20
  Administered 2022-03-20: 3 mL via RESPIRATORY_TRACT
  Filled 2022-03-20: qty 3

## 2022-03-20 MED ORDER — METHYLPREDNISOLONE SODIUM SUCC 125 MG IJ SOLR
125.0000 mg | Freq: Once | INTRAMUSCULAR | Status: AC
  Administered 2022-03-20: 125 mg via INTRAVENOUS
  Filled 2022-03-20: qty 2

## 2022-03-20 MED ORDER — PREDNISONE 20 MG PO TABS: 40.0000 mg | ORAL_TABLET | Freq: Every day | ORAL | 0 refills | Status: AC

## 2022-03-20 NOTE — ED Notes (Signed)
Patient refusing wheelchair and O2 to wait with in lobby. Patient ambulatory upon discharge with steady gait. ?

## 2022-03-20 NOTE — ED Triage Notes (Signed)
Pt comes into the ED via EMS from home with c/o SOB , hx of COPD/pulmonary edema. Started a couple days ago , 2.5L [place] all the time, 97%, recent admission for pulmonary edema ? ?#[id number] ?136/94 ?[id number] ?[id number] ? ?

## 2022-03-20 NOTE — Discharge Instructions (Addendum)
Call the pulmonary number to make a follow-up appointment due to your COPD.  Given your frequent exacerbations you may require additional medications to help prevent you from coming into the ER.  We have given you some steroids for a few days to try to help with your current flareup. ?

## 2022-03-20 NOTE — ED Provider Notes (Signed)
? ?[facility] ?Provider Note ? ? ? Event Date/Time  ? First MD Initiated Contact with Patient 03/20/22 1330   ?  (approximate) ? ? ?History  ? ?Shortness of Breath ? ? ?HPI ? ?Colleen [name] is a 61 y.o. female with history of COPD on 2 L as needed who comes in with concerns for shortness of breath.  Patient was initially treated with steroids and Levaquin and discharged on these to follow-up with pulmonary.  There is also concern that her anxiety contributes to her symptoms.  Patient reports that she started feeling bad this morning where her windows were left open and she thinks she got some pollen that then flared up her COPD and she had some shortness of breath.  She reports feeling better now but she want to make sure there was no fluid in her lungs or other causes of her shortness of breath.  She denies any new leg swelling.  She does report having some depression anxiety that seen by psychiatry but she denies any SI or HI declines psychiatry consultation today. ? ? ?I reviewed patient's discharge summary where she was admitted on 4/30 until 5/2 for COPD exacerbation ? ? ?Physical Exam  ? ?Triage Vital Signs: ?ED Triage Vitals  ?Enc Vitals Group  ?   BP 03/20/22 1044 126/79  ?   Pulse Rate 03/20/22 1044 (!) 109  ?   Resp 03/20/22 1044 (!) 22  ?   Temp 03/20/22 1044 98.5 ?F (36.9 ?C)  ?   Temp Source 03/20/22 1044 Oral  ?   SpO2 03/20/22 1044 96 %  ?   Weight 03/20/22 1045 124 lb (56.2 kg)  ?   Height 03/20/22 1045 5' (1.524 m)  ?   Head Circumference --   ?   Peak Flow --   ?   Pain Score --   ?   Pain Loc --   ?   Pain Edu? --   ?   Excl. in [place]? --   ? ? ?Most recent vital signs: ?Vitals:  ? 03/20/22 1046 03/20/22 1336  ?BP:  119/80  ?Pulse:  98  ?Resp:  19  ?Temp:    ?SpO2: 96% 99%  ? ? ? ?General: Awake, no distress.  ?CV:  Good peripheral perfusion.  ?Resp:  Normal effort.  Wheezing noted bilaterally ?Abd:  No distention.  Soft and nontender ?Other:  No swelling.  No calf  tenderness ? ? ?ED Results / Procedures / Treatments  ? ?Labs ?(all labs ordered are listed, but only abnormal results are displayed) ?Labs Reviewed  ?BASIC METABOLIC PANEL - Abnormal; Notable for the following components:  ?    Result Value  ? Sodium 132 (*)   ? Chloride 95 (*)   ? Glucose, Bld 104 (*)   ? All other components within normal limits  ?URINALYSIS, ROUTINE W REFLEX MICROSCOPIC - Abnormal; Notable for the following components:  ? Color, Urine STRAW (*)   ? APPearance CLEAR (*)   ? All other components within normal limits  ?CBC  ?TROPONIN I (HIGH SENSITIVITY)  ?TROPONIN I (HIGH SENSITIVITY)  ? ? ? ?EKG ? ?My interpretation of EKG: ? ?Sinus tachycardia rate of 105 without any ST elevation or T wave inversions, normal intervals ? ?RADIOLOGY ?I have reviewed the xray personally and interpreted it.  Patient has emphysema changes ? ?PROCEDURES: ? ?Critical Care performed: No ? ?.1-3 Lead EKG Interpretation ?Performed by: Vanessa [place], MD ?Authorized by: Vanessa [place]ar[place],  MD  ? ?  Interpretation: normal   ?  ECG rate:  90 ?  ECG rate assessment: normal   ?  Rhythm: sinus rhythm   ?  Ectopy: none   ?  Conduction: normal   ? ? ?MEDICATIONS ORDERED IN ED: ?Medications - No data to display ? ? ?IMPRESSION / MDM / ASSESSMENT AND PLAN / ED COURSE  ?I reviewed the triage vital signs and the nursing notes. ? ?Patient comes in with most likely COPD exacerbation.  She has frequent exacerbations.  Has not been able to get to pulmonary.  Suspect related to the pollen.  We will give a DuoNeb, steroids.  Lab work to evaluate for fluid overload, ACS.  Very low suspicion for PE given recurrent COPD exacerbations. ? ?BMP is overall reassuring slightly low sodium and chloride.  UA without evidence of UTI.  CBC reassuring.  Troponin negative.  I did review patient's CT imaging from 4/15 with no evidence of PE ? ?Patient does report psychiatric history and having some anxiety and stress with marriage  And I have offered her  psychiatric evaluation but patient declines that she denies any self-harm or thoughts of wanting to hurt herself.  She does have an outpatient psychiatrist that she will follow-up for her anxiety.  No indication for IVC at this time. ? ?Repeat troponin is negative.  BNP is negative.  I considered admission for patient given her frequent admissions for COPD flares but on examination she has no accessory muscle use and her wheezing is at her baseline and she would prefer discharge home ? ?We discussed the benefits and risk of steroids given her recurrent use of the but she does report that it helps with her COPD so we will give her another short course.  We also did discuss antibiotics but have opted to hold off given no fever or changes in cough or mucus production. ? ? ?The patient is on the cardiac monitor to evaluate for evidence of arrhythmia and/or significant heart rate changes. ? ?  ? ? ?FINAL CLINICAL IMPRESSION(S) / ED DIAGNOSES  ? ?Final diagnoses:  ?COPD exacerbation ([place])  ? ? ? ?Rx / DC Orders  ? ?ED Discharge Orders   ? ?      Ordered  ?  predniSONE (DELTASONE) 20 MG tablet  Daily with breakfast       ? 03/20/22 1457  ? ?  ?  ? ?  ? ? ? ?Note:  This document was prepared using Dragon voice recognition software and may include unintentional dictation errors. ?  ?Vanessa Brandywine, MD ?03/20/22 1458 ? ?

## 2022-03-30 ENCOUNTER — Observation Stay
Admission: EM | Admit: 2022-03-30 | Discharge: 2022-03-31 | Disposition: A | Discharge status: Home / Self Care | Attending: Internal Medicine | Admitting: Internal Medicine

## 2022-03-30 ENCOUNTER — Encounter: Payer: Self-pay | Admitting: Emergency Medicine

## 2022-03-30 ENCOUNTER — Other Ambulatory Visit: Payer: Self-pay

## 2022-03-30 ENCOUNTER — Emergency Department

## 2022-03-30 DIAGNOSIS — R0602 Shortness of breath: Secondary | ICD-10-CM | POA: Diagnosis present

## 2022-03-30 DIAGNOSIS — F17200 Nicotine dependence, unspecified, uncomplicated: Secondary | ICD-10-CM | POA: Diagnosis not present

## 2022-03-30 DIAGNOSIS — J45909 Unspecified asthma, uncomplicated: Secondary | ICD-10-CM | POA: Diagnosis not present

## 2022-03-30 DIAGNOSIS — J441 Chronic obstructive pulmonary disease with (acute) exacerbation: Principal | ICD-10-CM

## 2022-03-30 DIAGNOSIS — F418 Other specified anxiety disorders: Secondary | ICD-10-CM | POA: Diagnosis not present

## 2022-03-30 DIAGNOSIS — Z853 Personal history of malignant neoplasm of breast: Secondary | ICD-10-CM | POA: Insufficient documentation

## 2022-03-30 DIAGNOSIS — I2510 Atherosclerotic heart disease of native coronary artery without angina pectoris: Secondary | ICD-10-CM | POA: Insufficient documentation

## 2022-03-30 DIAGNOSIS — Z20822 Contact with and (suspected) exposure to covid-19: Secondary | ICD-10-CM | POA: Insufficient documentation

## 2022-03-30 DIAGNOSIS — F17210 Nicotine dependence, cigarettes, uncomplicated: Secondary | ICD-10-CM | POA: Insufficient documentation

## 2022-03-30 DIAGNOSIS — F410 Panic disorder [episodic paroxysmal anxiety] without agoraphobia: Secondary | ICD-10-CM | POA: Diagnosis not present

## 2022-03-30 DIAGNOSIS — Z79899 Other long term (current) drug therapy: Secondary | ICD-10-CM | POA: Diagnosis not present

## 2022-03-30 DIAGNOSIS — F331 Major depressive disorder, recurrent, moderate: Secondary | ICD-10-CM | POA: Diagnosis not present

## 2022-03-30 DIAGNOSIS — F411 Generalized anxiety disorder: Secondary | ICD-10-CM

## 2022-03-30 LAB — BLOOD GAS, VENOUS
Acid-Base Excess: 5.2 mmol/L — ABNORMAL HIGH (ref 0.0–2.0)
Bicarbonate: 32.9 mmol/L — ABNORMAL HIGH (ref 20.0–28.0)
O2 Saturation: 74.4 %
Patient temperature: 37
pCO2, Ven: 61 mmHg — ABNORMAL HIGH (ref 44–60)
pH, Ven: 7.34 (ref 7.25–7.43)
pO2, Ven: 42 mmHg (ref 32–45)

## 2022-03-30 LAB — BASIC METABOLIC PANEL
Anion gap: 9 (ref 5–15)
BUN: 5 mg/dL — ABNORMAL LOW (ref 8–23)
CO2: 28 mmol/L (ref 22–32)
Calcium: 8.7 mg/dL — ABNORMAL LOW (ref 8.9–10.3)
Chloride: 95 mmol/L — ABNORMAL LOW (ref 98–111)
Creatinine, Ser: 0.48 mg/dL (ref 0.44–1.00)
GFR, Estimated: >60 mL/min (ref >60)
Glucose, Bld: 146 mg/dL — ABNORMAL HIGH (ref 70–99)
Potassium: 4.5 mmol/L (ref 3.5–5.1)
Sodium: 132 mmol/L — ABNORMAL LOW (ref 135–145)

## 2022-03-30 LAB — CBC
HCT: 43.4 % (ref 36.0–46.0)
Hemoglobin: 13.9 g/dL (ref 12.0–15.0)
MCH: 30.1 pg (ref 26.0–34.0)
MCHC: 32 g/dL (ref 30.0–36.0)
MCV: 93.9 fL (ref 80.0–100.0)
Platelets: 293 10*3/uL (ref 150–400)
RBC: 4.62 MIL/uL (ref 3.87–5.11)
RDW: 13.9 % (ref 11.5–15.5)
WBC: 7.3 10*3/uL (ref 4.0–10.5)
nRBC: 0 % (ref 0.0–0.2)

## 2022-03-30 LAB — RESP PANEL BY RT-PCR (FLU A&B, COVID) ARPGX2
Influenza A by PCR: NEGATIVE
Influenza B by PCR: NEGATIVE
SARS Coronavirus 2 by RT PCR: NEGATIVE

## 2022-03-30 LAB — TROPONIN I (HIGH SENSITIVITY): Troponin I (High Sensitivity): 6 ng/L (ref <18)

## 2022-03-30 LAB — LACTIC ACID, PLASMA: Lactic Acid, Venous: 1.1 mmol/L (ref 0.5–1.9)

## 2022-03-30 LAB — PROCALCITONIN: Procalcitonin: <0.1 ng/mL

## 2022-03-30 LAB — D-DIMER, QUANTITATIVE: D-Dimer, Quant: 0.39 ug/mL-FEU (ref 0.00–0.50)

## 2022-03-30 IMAGING — DX DG CHEST 1V PORT
1 series · 1 of 1 positions shown · non-contrast
Comparison: [DATE] and prior studies

CLINICAL DATA: Shortness of breath.

EXAM:
PORTABLE CHEST 1 VIEW

[chest ap]
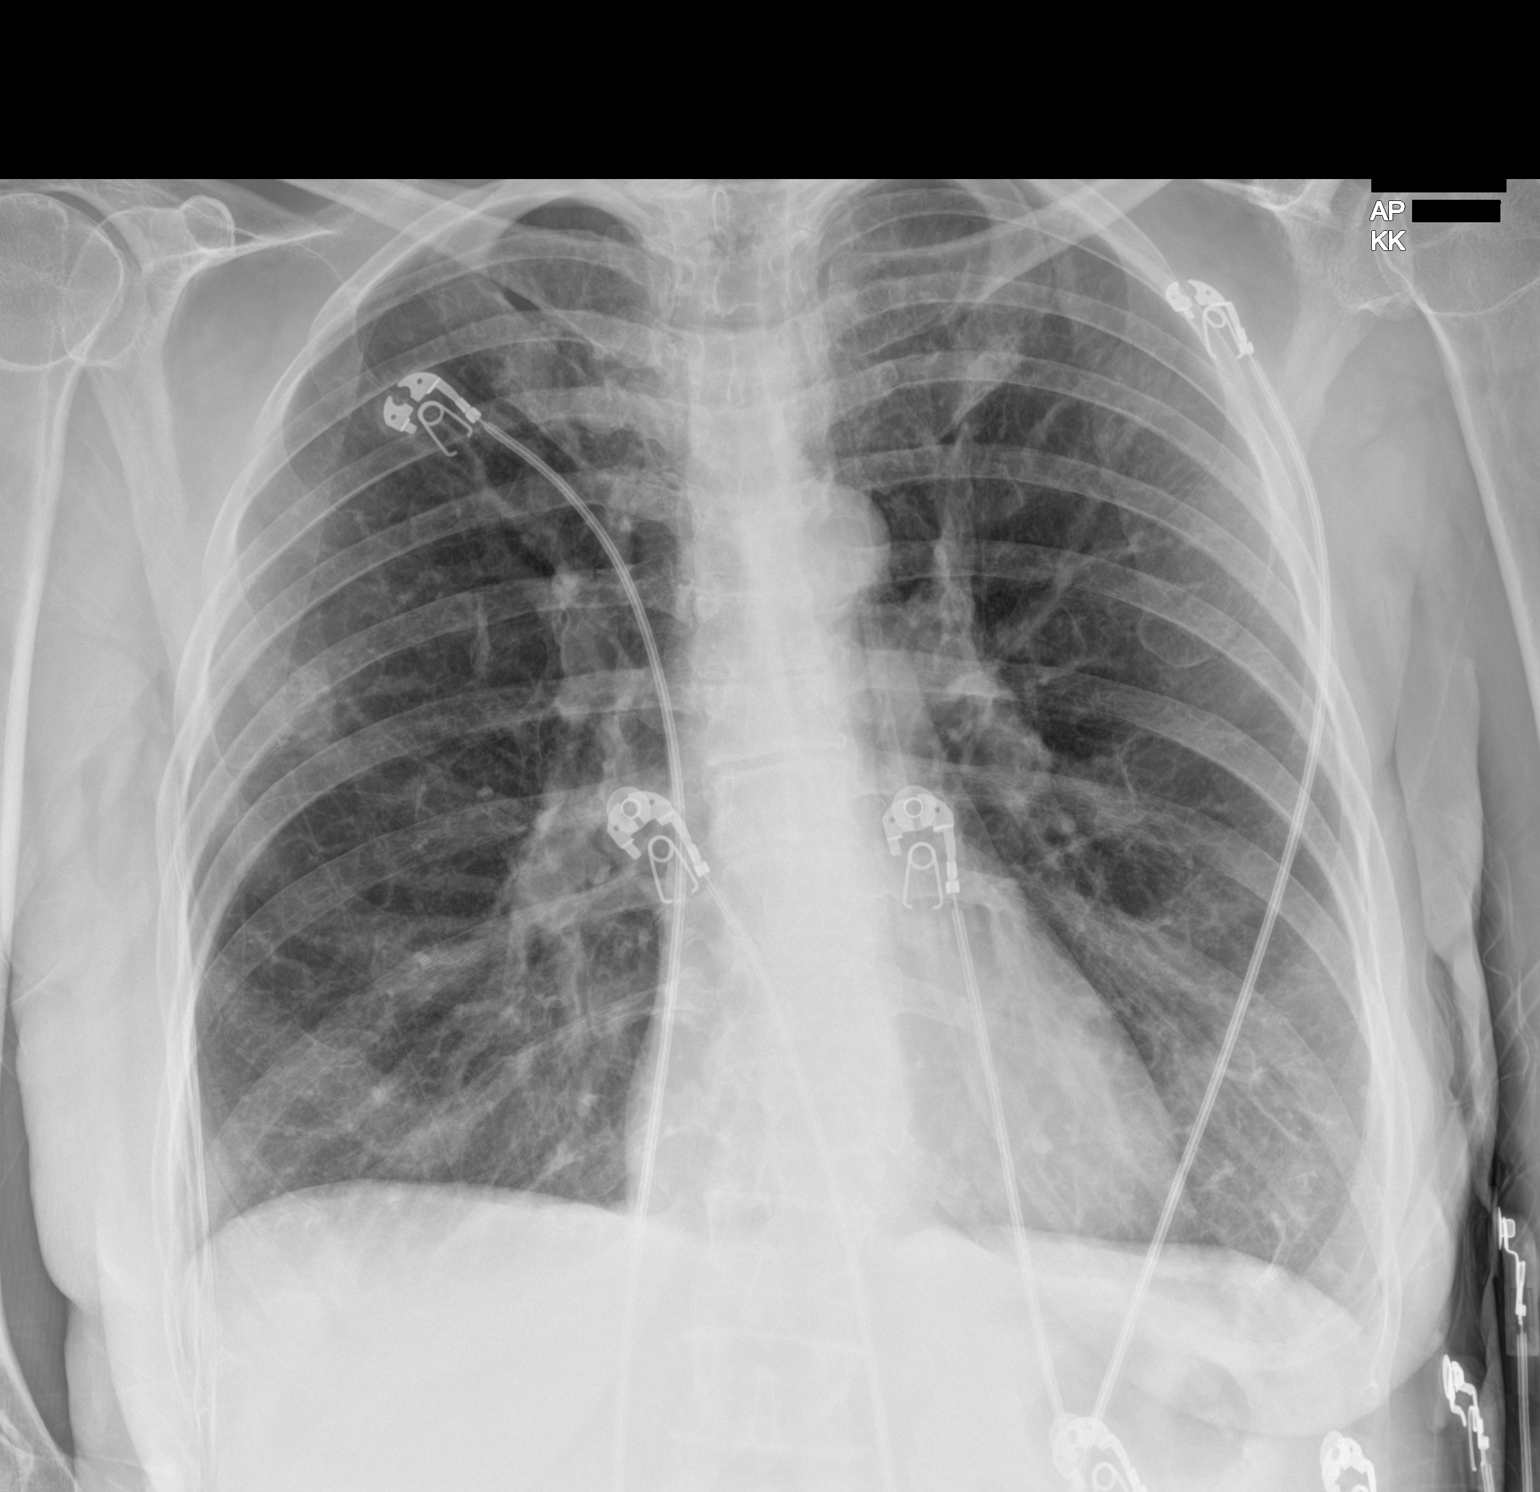

[1 of 1 positions shown; findings below may reference images not displayed]

FINDINGS: The cardiomediastinal silhouette is unremarkable.

COPD/emphysema changes are again noted.

There is no evidence of focal airspace disease, pulmonary edema,
suspicious pulmonary nodule/mass, pleural effusion, or pneumothorax.

No acute bony abnormalities are identified. Remote RIGHT rib
fracture again noted.
IMPRESSION: COPD/emphysema without evidence of acute cardiopulmonary disease.

## 2022-03-30 MED ORDER — ADULT MULTIVITAMIN W/MINERALS CH
1.0000 | ORAL_TABLET | Freq: Every day | ORAL | Status: DC
  Administered 2022-03-30 – 2022-03-31 (×2): 1 via ORAL
  Filled 2022-03-30 (×2): qty 1

## 2022-03-30 MED ORDER — ACETAMINOPHEN 325 MG PO TABS
650.0000 mg | ORAL_TABLET | Freq: Four times a day (QID) | ORAL | Status: DC | PRN
  Administered 2022-03-31: 650 mg via ORAL
  Filled 2022-03-30: qty 2

## 2022-03-30 MED ORDER — ENSURE ENLIVE PO LIQD
237.0000 mL | Freq: Two times a day (BID) | ORAL | Status: DC
  Administered 2022-03-30 – 2022-03-31 (×2): 237 mL via ORAL

## 2022-03-30 MED ORDER — MIRTAZAPINE 15 MG PO TABS
15.0000 mg | ORAL_TABLET | Freq: Every day | ORAL | Status: DC
  Administered 2022-03-30: 15 mg via ORAL
  Filled 2022-03-30: qty 1

## 2022-03-30 MED ORDER — QUETIAPINE FUMARATE 200 MG PO TABS
200.0000 mg | ORAL_TABLET | Freq: Every day | ORAL | Status: DC
  Administered 2022-03-30: 200 mg via ORAL
  Filled 2022-03-30: qty 8
  Filled 2022-03-30: qty 1

## 2022-03-30 MED ORDER — IPRATROPIUM BROMIDE 0.02 % IN SOLN
0.5000 mg | Freq: Four times a day (QID) | RESPIRATORY_TRACT | Status: DC
  Filled 2022-03-30: qty 2.5

## 2022-03-30 MED ORDER — UMECLIDINIUM BROMIDE 62.5 MCG/ACT IN AEPB
1.0000 | INHALATION_SPRAY | Freq: Every day | RESPIRATORY_TRACT | Status: DC
  Administered 2022-03-30 – 2022-03-31 (×2): 1 via RESPIRATORY_TRACT
  Filled 2022-03-30: qty 7

## 2022-03-30 MED ORDER — NICOTINE 21 MG/24HR TD PT24
21.0000 mg | MEDICATED_PATCH | Freq: Every day | TRANSDERMAL | Status: DC
  Administered 2022-03-31: 21 mg via TRANSDERMAL
  Filled 2022-03-30 (×2): qty 1

## 2022-03-30 MED ORDER — DM-GUAIFENESIN ER 30-600 MG PO TB12
1.0000 | ORAL_TABLET | Freq: Two times a day (BID) | ORAL | Status: DC | PRN
  Administered 2022-03-30 – 2022-03-31 (×2): 1 via ORAL
  Filled 2022-03-30 (×2): qty 1

## 2022-03-30 MED ORDER — FLUTICASONE FUROATE-VILANTEROL 100-25 MCG/ACT IN AEPB
1.0000 | INHALATION_SPRAY | Freq: Every day | RESPIRATORY_TRACT | Status: DC
  Administered 2022-03-30 – 2022-03-31 (×2): 1 via RESPIRATORY_TRACT
  Filled 2022-03-30: qty 28

## 2022-03-30 MED ORDER — CITALOPRAM HYDROBROMIDE 20 MG PO TABS
10.0000 mg | ORAL_TABLET | Freq: Every day | ORAL | Status: DC
  Administered 2022-03-30 – 2022-03-31 (×2): 10 mg via ORAL
  Filled 2022-03-30 (×2): qty 1

## 2022-03-30 MED ORDER — FLUTICASONE PROPIONATE 50 MCG/ACT NA SUSP
2.0000 | Freq: Every day | NASAL | Status: DC | PRN
  Filled 2022-03-30: qty 16

## 2022-03-30 MED ORDER — METHYLPREDNISOLONE SODIUM SUCC 125 MG IJ SOLR
80.0000 mg | Freq: Every day | INTRAMUSCULAR | Status: DC
Start: 2022-03-31 — End: 2022-03-31
  Administered 2022-03-31: 80 mg via INTRAVENOUS
  Filled 2022-03-30: qty 2

## 2022-03-30 MED ORDER — DIPHENHYDRAMINE HCL 50 MG/ML IJ SOLN
25.0000 mg | Freq: Four times a day (QID) | INTRAMUSCULAR | Status: DC | PRN
  Administered 2022-03-31 (×2): 25 mg via INTRAVENOUS
  Filled 2022-03-30 (×2): qty 1

## 2022-03-30 MED ORDER — LEVALBUTEROL HCL 0.63 MG/3ML IN NEBU: 0.6300 mg | INHALATION_SOLUTION | Freq: Four times a day (QID) | RESPIRATORY_TRACT | Status: DC | PRN

## 2022-03-30 MED ORDER — VITAMIN B-12 1000 MCG PO TABS
500.0000 ug | ORAL_TABLET | Freq: Every day | ORAL | Status: DC
  Administered 2022-03-31: 500 ug via ORAL
  Filled 2022-03-30: qty 1

## 2022-03-30 MED ORDER — ALBUTEROL SULFATE (2.5 MG/3ML) 0.083% IN NEBU
2.5000 mg | INHALATION_SOLUTION | RESPIRATORY_TRACT | Status: AC
  Administered 2022-03-30: 2.5 mg via RESPIRATORY_TRACT
  Filled 2022-03-30: qty 3

## 2022-03-30 MED ORDER — VITAMIN D 25 MCG (1000 UNIT) PO TABS
1000.0000 [IU] | ORAL_TABLET | Freq: Every day | ORAL | Status: DC
  Administered 2022-03-31: 1000 [IU] via ORAL
  Filled 2022-03-30 (×2): qty 1

## 2022-03-30 MED ORDER — CYCLOBENZAPRINE HCL 10 MG PO TABS: 10.0000 mg | ORAL_TABLET | Freq: Three times a day (TID) | ORAL | Status: DC | PRN

## 2022-03-30 MED ORDER — ONDANSETRON HCL 4 MG/2ML IJ SOLN: 4.0000 mg | Freq: Three times a day (TID) | INTRAMUSCULAR | Status: DC | PRN

## 2022-03-30 MED ORDER — DIAZEPAM 2 MG PO TABS
2.0000 mg | ORAL_TABLET | Freq: Three times a day (TID) | ORAL | Status: DC | PRN
  Administered 2022-03-30 – 2022-03-31 (×3): 2 mg via ORAL
  Filled 2022-03-30 (×3): qty 1

## 2022-03-30 MED ORDER — IPRATROPIUM BROMIDE 0.02 % IN SOLN
0.5000 mg | RESPIRATORY_TRACT | Status: DC
Start: 2022-03-30 — End: 2022-03-30
  Administered 2022-03-30: 0.5 mg via RESPIRATORY_TRACT
  Filled 2022-03-30: qty 2.5

## 2022-03-30 MED ORDER — DIAZEPAM 2 MG PO TABS
2.0000 mg | ORAL_TABLET | Freq: Once | ORAL | Status: AC
  Administered 2022-03-30: 2 mg via ORAL
  Filled 2022-03-30: qty 1

## 2022-03-30 MED ORDER — LEVALBUTEROL HCL 1.25 MG/0.5ML IN NEBU
1.2500 mg | INHALATION_SOLUTION | Freq: Once | RESPIRATORY_TRACT | Status: AC
  Administered 2022-03-30: 1.25 mg via RESPIRATORY_TRACT
  Filled 2022-03-30: qty 0.5

## 2022-03-30 MED ORDER — METHYLPREDNISOLONE SODIUM SUCC 125 MG IJ SOLR: 80.0000 mg | Freq: Every day | INTRAMUSCULAR | Status: DC

## 2022-03-30 MED ORDER — ENOXAPARIN SODIUM 40 MG/0.4ML IJ SOSY
40.0000 mg | PREFILLED_SYRINGE | INTRAMUSCULAR | Status: DC
  Administered 2022-03-30: 40 mg via SUBCUTANEOUS
  Filled 2022-03-30: qty 0.4

## 2022-03-30 NOTE — ED Notes (Signed)
Dietary called at this time for ensure.

## 2022-03-30 NOTE — Progress Notes (Signed)
Behavioral health provider on the floor to assess patient. Patient already had order for psych consult. Patient currently not a threat, chronic depression and anxiety per behavioral health provider.

## 2022-03-30 NOTE — Consult Note (Signed)
[facility] Face-to-Face Psychiatry Consult   Reason for Consult:  anxiety, stress, panic Referring Physician:  Dr Jacqualine Code Patient Identification: Colleen [name] MRN:  [id number] Principal Diagnosis: COPD exacerbation ([place]) Diagnosis:  Principal Problem:   COPD exacerbation ([place]) Active Problems:   Panic attack   Major depressive disorder, recurrent episode, moderate (HCC)   Tobacco use disorder   Total Time spent with patient: 45 minutes  Subjective:   [name] is a 61 y.o. female patient admitted with COPD exacerbation, consult for anxiety.  HPI:  61 yo female presented with SOB related to a COPD exacerbation, consult for anxiety.  She rates her depression as moderate related to stress with her husband.  She perseverates throughout the assessment about her being upset that he is taking over things and enjoying it.  For instance, he now does all of the grocery shopping and "finds pleasure in it."  She is frustrated when she asks him to do things like "give the dog fresh water that he likes" and he doesn't.  Reports having chronic, passive, intermittent suicidal ideations at times when frustrated with him.  "None right now".  No homicidal ideations or hallucinations or substance abuse.  She does have a therapist and a psychiatrist who she has an appointment with tomorrow via phone.  Encouraged her to use her coping skills that her therapist is working with her on reducing her frustration with her husband.  He psychiatrist can make changes tomorrow based on her findings.  She does not warrant psych admission.  Past Psychiatric History: depression, anxiety, PTSD  Risk to Self:  none Risk to Others:  none Prior Inpatient Therapy:  gero in February Prior Outpatient Therapy: Dr Shea Evans   Past Medical History:  Past Medical History:  Diagnosis Date   Anxiety    Asthma    Breast cancer ([place]) 2004   left breast   Chronic back pain    COPD (chronic obstructive pulmonary disease) ([place])     Depression    History of kidney surgery    Personal history of radiation therapy    PTSD (post-traumatic stress disorder)     Past Surgical History:  Procedure Laterality Date   BREAST BIOPSY Left 2004   positive   BREAST BIOPSY Right    neg   BREAST LUMPECTOMY Left 2004   positive   LEFT HEART CATH AND CORONARY ANGIOGRAPHY N/A 11/06/2021   Procedure: LEFT HEART CATH AND CORONARY ANGIOGRAPHY;  Surgeon: Corey Skains, MD;  Location: [facility];  Service: Cardiovascular;  Laterality: N/A;   MASTECTOMY Left    MASTECTOMY     NEPHRECTOMY Left    TUBAL LIGATION     Family History:  Family History  Problem Relation Age of Onset   Breast cancer Cousin    Breast cancer Cousin    Bipolar disorder Daughter    Drug abuse Daughter    Alcohol abuse Maternal Grandfather    Family Psychiatric  History: see above Social History:  Social History   Substance and Sexual Activity  Alcohol Use Never     Social History   Substance and Sexual Activity  Drug Use Never   Comment: prescribed muscle relaxers and valium    Social History   Socioeconomic History   Marital status: Married    Spouse name: Not on file   Number of children: 2   Years of education: GED   Highest education level: Not on file  Occupational History   Not on file  Tobacco  Use   Smoking status: Every Day    Packs/day: 0.50    Types: Cigarettes    Passive exposure: Never   Smokeless tobacco: Never  Vaping Use   Vaping Use: Never used  Substance and Sexual Activity   Alcohol use: Never   Drug use: Never    Comment: prescribed muscle relaxers and valium   Sexual activity: Not Currently  Other Topics Concern   Not on file  Social History Narrative   Not on file   Social Determinants of Health   Financial Resource Strain: Not on file  Food Insecurity: Not on file  Transportation Needs: Not on file  Physical Activity: Not on file  Stress: Not on file  Social Connections: Not on file    Additional Social History:    Allergies:   Allergies  Allergen Reactions   Desvenlafaxine Anaphylaxis   Morphine And Related Anaphylaxis   Penicillins Anaphylaxis    Has patient had a PCN reaction causing immediate rash, facial/tongue/throat swelling, SOB or lightheadedness with hypotension: Yes Has patient had a PCN reaction causing severe rash involving mucus membranes or skin necrosis: No Has patient had a PCN reaction that required hospitalization: Unknown Has patient had a PCN reaction occurring within the last 10 years: No If all of the above answers are "NO", then may proceed with Cephalosporin use.    Prazosin Other (See Comments)   Tamoxifen Anaphylaxis   Trazodone Anaphylaxis   Clonazepam    Codeine Swelling   Duloxetine Itching   Gabapentin Hives    Rash and swelling.    Hydroxyzine Itching   Lorazepam     Patient feels this medications makes her to sedated and wants to avoid   Paroxetine Hcl Hives   Sulfa Antibiotics Itching   Cephalexin Rash    Labs:  Results for orders placed or performed during the hospital encounter of 03/30/22 (from the past 48 hour(s))  CBC     Status: None   Collection Time: 03/30/22 10:04 AM  Result Value Ref Range   WBC 7.3 4.0 - 10.5 K/uL   RBC 4.62 3.87 - 5.11 MIL/uL   Hemoglobin 13.9 12.0 - 15.0 g/dL   HCT 43.4 36.0 - 46.0 %   MCV 93.9 80.0 - 100.0 fL   MCH 30.1 26.0 - 34.0 pg   MCHC 32.0 30.0 - 36.0 g/dL   RDW 13.9 11.5 - 15.5 %   Platelets 293 150 - 400 K/uL   nRBC 0.0 0.0 - 0.2 %    Comment: Performed at [facility], [place], [place] [address]  Troponin I (High Sensitivity)     Status: None   Collection Time: 03/30/22 10:04 AM  Result Value Ref Range   Troponin I (High Sensitivity) 6 <18 ng/L    Comment: (NOTE) Elevated high sensitivity troponin I (hsTnI) values and significant  changes across serial measurements may suggest ACS but many other  chronic and acute conditions are known to  elevate hsTnI results.  Refer to the "Links" section for chest pain algorithms and additional  guidance. Performed at [facility], [address] [place], [place] [address]   Lactic acid, plasma     Status: None   Collection Time: 03/30/22 10:04 AM  Result Value Ref Range   Lactic Acid, Venous 1.1 0.5 - 1.9 mmol/L    Comment: Performed at [facility], [address] [place], [place] [address]  Blood gas, venous     Status: Abnormal  Collection Time: 03/30/22 10:39 AM  Result Value Ref Range   pH, Ven 7.34 7.25 - 7.43   pCO2, Ven 61 (H) 44 - 60 mmHg   pO2, Ven 42 32 - 45 mmHg   Bicarbonate 32.9 (H) 20.0 - 28.0 mmol/L   Acid-Base Excess 5.2 (H) 0.0 - 2.0 mmol/L   O2 Saturation 74.4 %   Patient temperature 37.0    Collection site VENOUS     Comment: Performed at [facility], [address] [place], [place] [address]  Basic metabolic panel     Status: Abnormal   Collection Time: 03/30/22 10:45 AM  Result Value Ref Range   Sodium 132 (L) 135 - 145 mmol/L   Potassium 4.5 3.5 - 5.1 mmol/L    Comment: HEMOLYSIS AT THIS LEVEL MAY AFFECT RESULT   Chloride 95 (L) 98 - 111 mmol/L   CO2 28 22 - 32 mmol/L   Glucose, Bld 146 (H) 70 - 99 mg/dL    Comment: Glucose reference range applies only to samples taken after fasting for at least 8 hours.   BUN 5 (L) 8 - 23 mg/dL   Creatinine, Ser 0.48 0.44 - 1.00 mg/dL   Calcium 8.7 (L) 8.9 - 10.3 mg/dL   GFR, Estimated >60 >60 mL/min    Comment: (NOTE) Calculated using the CKD-EPI Creatinine Equation (2021)    Anion gap 9 5 - 15    Comment: Performed at [facility], [address] [place], [place] [address]  Procalcitonin     Status: None   Collection Time: 03/30/22 10:45 AM  Result Value Ref Range   Procalcitonin <0.10 ng/mL    Comment:        Interpretation: PCT (Procalcitonin) <= 0.5 ng/mL: Systemic infection (sepsis) is not likely. Local bacterial infection is possible. (NOTE)        Sepsis PCT Algorithm           Lower Respiratory Tract                                      Infection PCT Algorithm    ----------------------------     ----------------------------         PCT < 0.25 ng/mL                PCT < 0.10 ng/mL          Strongly encourage             Strongly discourage   discontinuation of antibiotics    initiation of antibiotics    ----------------------------     -----------------------------       PCT 0.25 - 0.50 ng/mL            PCT 0.10 - 0.25 ng/mL               OR       >80% decrease in PCT            Discourage initiation of                                            antibiotics      Encourage discontinuation           of antibiotics    ----------------------------     -----------------------------  PCT >= 0.50 ng/mL              PCT 0.26 - 0.50 ng/mL               AND        <80% decrease in PCT             Encourage initiation of                                             antibiotics       Encourage continuation           of antibiotics    ----------------------------     -----------------------------        PCT >= 0.50 ng/mL                  PCT > 0.50 ng/mL               AND         increase in PCT                  Strongly encourage                                      initiation of antibiotics    Strongly encourage escalation           of antibiotics                                     -----------------------------                                           PCT <= 0.25 ng/mL                                                 OR                                        > 80% decrease in PCT                                      Discontinue / Do not initiate                                             antibiotics  Performed at [facility], [address] [place], [place] [address]   D-dimer, quantitative     Status: None   Collection Time: 03/30/22 11:09 AM  Result Value Ref Range   D-Dimer, Quant 0.39 0.00 - 0.50  ug/mL-FEU    Comment: (NOTE) At the manufacturer cut-off value of 0.5 g/mL FEU, this assay has a negative predictive  value of 95-100%.This assay is intended for use in conjunction with a clinical pretest probability (PTP) assessment model to exclude pulmonary embolism (PE) and deep venous thrombosis (DVT) in outpatients suspected of PE or DVT. Results should be correlated with clinical presentation. Performed at [facility], [address] [place], [place] [address]   Resp Panel by RT-PCR (Flu A&B, Covid) Nasopharyngeal Swab     Status: None   Collection Time: 03/30/22  1:09 PM   Specimen: Nasopharyngeal Swab; Nasopharyngeal(NP) swabs in vial transport medium  Result Value Ref Range   SARS Coronavirus 2 by RT PCR NEGATIVE NEGATIVE    Comment: (NOTE) SARS-CoV-2 target nucleic acids are NOT DETECTED.  The SARS-CoV-2 RNA is generally detectable in upper respiratory specimens during the acute phase of infection. The lowest concentration of SARS-CoV-2 viral copies this assay can detect is 138 copies/mL. A negative result does not preclude SARS-Cov-2 infection and should not be used as the sole basis for treatment or other patient management decisions. A negative result may occur with  improper specimen collection/handling, submission of specimen other than nasopharyngeal swab, presence of viral mutation(s) within the areas targeted by this assay, and inadequate number of viral copies(<138 copies/mL). A negative result must be combined with clinical observations, patient history, and epidemiological information. The expected result is Negative.  Fact Sheet for Patients:  EntrepreneurPulse.com.au  Fact Sheet for Healthcare Providers:  IncredibleEmployment.be  This test is no t yet approved or cleared by the [place] FDA and  has been authorized for detection and/or diagnosis of SARS-CoV-2 by FDA under an Emergency Use Authorization  (EUA). This EUA will remain  in effect (meaning this test can be used) for the duration of the COVID-19 declaration under Section 564(b)(1) of the Act, 21 U.S.C.section 360bbb-3(b)(1), unless the authorization is terminated  or revoked sooner.       Influenza A by PCR NEGATIVE NEGATIVE   Influenza B by PCR NEGATIVE NEGATIVE    Comment: (NOTE) The Xpert Xpress SARS-CoV-2/FLU/RSV plus assay is intended as an aid in the diagnosis of influenza from Nasopharyngeal swab specimens and should not be used as a sole basis for treatment. Nasal washings and aspirates are unacceptable for Xpert Xpress SARS-CoV-2/FLU/RSV testing.  Fact Sheet for Patients: EntrepreneurPulse.com.au  Fact Sheet for Healthcare Providers: IncredibleEmployment.be  This test is not yet approved or cleared by the [place] FDA and has been authorized for detection and/or diagnosis of SARS-CoV-2 by FDA under an Emergency Use Authorization (EUA). This EUA will remain in effect (meaning this test can be used) for the duration of the COVID-19 declaration under Section 564(b)(1) of the Act, 21 U.S.C. section 360bbb-3(b)(1), unless the authorization is terminated or revoked.  Performed at [facility], [address] [place], [place] [address]     Current Facility-Administered Medications  Medication Dose Route Frequency Provider Last Rate Last Admin   acetaminophen (TYLENOL) tablet 650 mg  650 mg Oral Q6H PRN Ivor Costa, MD       Derrill Memo ON 03/31/2022] cholecalciferol (VITAMIN D3) tablet 1,000 Units  1,000 Units Oral Daily Ivor Costa, MD       citalopram (CELEXA) tablet 10 mg  10 mg Oral Daily Ivor Costa, MD   10 mg at 03/30/22 1526   cyclobenzaprine (FLEXERIL) tablet 10 mg  10 mg Oral TID PRN Ivor Costa, MD       dextromethorphan-guaiFENesin [facility] DM) 30-600 MG per 12 hr tablet 1 tablet  1 tablet Oral BID PRN Ivor Costa, MD  1 tablet at 03/30/22 1527   diazepam  (VALIUM) tablet 2 mg  2 mg Oral Q8H PRN Ivor Costa, MD   2 mg at 03/30/22 1526   diphenhydrAMINE (BENADRYL) injection 25 mg  25 mg Intravenous Q6H PRN Ivor Costa, MD       enoxaparin (LOVENOX) injection 40 mg  40 mg Subcutaneous Q24H Ivor Costa, MD       feeding supplement (ENSURE ENLIVE / ENSURE PLUS) liquid 237 mL  237 mL Oral BID BM Ivor Costa, MD   237 mL at 03/30/22 1527   fluticasone (FLONASE) 50 MCG/ACT nasal spray 2 spray  2 spray Each Nare Daily PRN Ivor Costa, MD       fluticasone furoate-vilanterol (BREO ELLIPTA) 100-25 MCG/ACT 1 puff  1 puff Inhalation Daily Ivor Costa, MD       And   umeclidinium bromide (INCRUSE ELLIPTA) 62.5 MCG/ACT 1 puff  1 puff Inhalation Daily Ivor Costa, MD       ipratropium (ATROVENT) nebulizer solution 0.5 mg  0.5 mg Nebulization Q4H Ivor Costa, MD       levalbuterol Penne Lash) nebulizer solution 0.63 mg  0.63 mg Nebulization Q6H PRN Ivor Costa, MD       Derrill Memo ON 03/31/2022] methylPREDNISolone sodium succinate (SOLU-MEDROL) 125 mg/2 mL injection 80 mg  80 mg Intravenous Daily Ivor Costa, MD       mirtazapine (REMERON) tablet 15 mg  15 mg Oral QHS Ivor Costa, MD       multivitamin with minerals tablet 1 tablet  1 tablet Oral Daily Ivor Costa, MD   1 tablet at 03/30/22 1526   nicotine (NICODERM CQ - dosed in mg/24 hours) patch 21 mg  21 mg Transdermal Daily Ivor Costa, MD       ondansetron [facility]) injection 4 mg  4 mg Intravenous Q8H PRN Ivor Costa, MD       QUEtiapine (SEROQUEL) tablet 200 mg  200 mg Oral QHS Ivor Costa, MD       Derrill Memo ON 03/31/2022] vitamin B-12 (CYANOCOBALAMIN) tablet 500 mcg  500 mcg Oral Daily Ivor Costa, MD        Musculoskeletal: Strength & Muscle Tone: decreased Gait & Station:  did not witness Patient leans: N/A  Psychiatric Specialty Exam: Physical Exam Vitals and nursing note reviewed.  Constitutional:      Appearance: Normal appearance.  HENT:     Head: Normocephalic.     Nose: Nose normal.  Pulmonary:     Effort:  Pulmonary effort is normal.  Musculoskeletal:        General: Normal range of motion.     Cervical back: Normal range of motion.  Neurological:     General: No focal deficit present.     Mental Status: She is alert and oriented to person, place, and time.  Psychiatric:        Attention and Perception: Attention and perception normal.        Mood and Affect: Mood is anxious and depressed.        Behavior: Behavior normal. Behavior is cooperative.        Thought Content: Thought content normal.        Cognition and Memory: Cognition and memory normal.        Judgment: Judgment normal.    Review of Systems  Respiratory:  Positive for shortness of breath.   Psychiatric/Behavioral:  Positive for depression. The patient is nervous/anxious.   All other systems reviewed and are negative.  Blood pressure 110/71,  pulse (!) 105, temperature 98.3 F (36.8 C), temperature source Oral, resp. rate 20, height 5' (1.524 m), weight 52.7 kg, SpO2 94 %.Body mass index is 22.69 kg/m.  General Appearance: Casual  Eye Contact:  Good  Speech:  Normal Rate  Volume:  Normal  Mood:  Anxious and Depressed  Affect:  Congruent  Thought Process:  Coherent and Descriptions of Associations: Intact  Orientation:  Full (Time, Place, and Person)  Thought Content:  WDL and Logical  Suicidal Thoughts:  No  Homicidal Thoughts:  No  Memory:  Immediate;   Good Recent;   Good Remote;   Good  Judgement:  Good  Insight:  Good  Psychomotor Activity:  Decreased  Concentration:  Concentration: Good and Attention Span: Good  Recall:  Good  Fund of Knowledge:  Good  Language:  Good  Akathisia:  No  Handed:  Right  AIMS (if indicated):     Assets:  Housing Leisure Time Resilience Social Support  ADL's:  Intact  Cognition:  WNL  Sleep:      cla  Physical Exam: Physical Exam Vitals and nursing note reviewed.  Constitutional:      Appearance: Normal appearance.  HENT:     Head: Normocephalic.     Nose: Nose  normal.  Pulmonary:     Effort: Pulmonary effort is normal.  Musculoskeletal:        General: Normal range of motion.     Cervical back: Normal range of motion.  Neurological:     General: No focal deficit present.     Mental Status: She is alert and oriented to person, place, and time.  Psychiatric:        Attention and Perception: Attention and perception normal.        Mood and Affect: Mood is anxious and depressed.        Behavior: Behavior normal. Behavior is cooperative.        Thought Content: Thought content normal.        Cognition and Memory: Cognition and memory normal.        Judgment: Judgment normal.   Review of Systems  Respiratory:  Positive for shortness of breath.   Psychiatric/Behavioral:  Positive for depression. The patient is nervous/anxious.   All other systems reviewed and are negative. Blood pressure 110/71, pulse (!) 105, temperature 98.3 F (36.8 C), temperature source Oral, resp. rate 20, height 5' (1.524 m), weight 52.7 kg, SpO2 94 %. Body mass index is 22.69 kg/m.  Treatment Plan Summary: Major depressive disorder, recurrent, moderate: Continue Celexa 10 mg daily Follow up with psychiatrist tomorrow via tele-psych  Anxiety: Continue Valium 2 mg daily PRN  Insomnia: Continue Remeron 15 mg at bedtime Continue Seroquel 200 mg at bedtime  Disposition: No evidence of imminent risk to self or others at present.   Patient does not meet criteria for psychiatric inpatient admission.  Waylan Boga, NP 03/30/2022 3:48 PM

## 2022-03-30 NOTE — H&P (Signed)
History and Physical    Colleen [name] MHD:[id number] DOB: 08-22-1961 DOA: 03/30/2022  Referring MD/NP/PA:   PCP: Remi Haggard, FNP   Patient coming from:  The patient is coming from home.  At baseline, pt is independent for most of ADL.        Chief Complaint: SOB  HPI: Colleen [name] is a 61 y.o. female with medical history significant of COPD on 2 L nocturnal oxygen, asthma, depression, anxiety, PTSD, panic attack, left breast cancer (s/p of radiation therapy), tobacco abuse, CAD, who presents with shortness of breath.  Patient was recently hospitalized from 4/30 - 5/2 due to COPD exacerbation.  Patient finished course of Levaquin.  Patient states that she developed shortness of breath 2 days ago, which has been progressively worsening.  Patient has cough with little mucus production and wheezing.  She also reports severe postnasal drip.  No chest pain, fever or chills.  She states she has a loose stool bowel movement, but no active diarrhea.  Denies nausea vomiting or abdominal pain.  No symptoms of UTI.  Per EMS, pt was found to have respiratory distress, with O2 saturation in 80s. Pt was given DuoNeb and 125 mg Solu-Medrol, and then the patient started to report that she had feeling like she was having difficulty swallowing and she felt itchy in throat. EMS thus provided epinephrine 0.3 mg IM as well as 50 mg of IV Benadryl. Her symptoms of difficulty swallowing and itches in throat have resolved when I saw patient in ED. Pt is very anxious during interview.  She possibly had a panic attack.  Data Reviewed and ED Course: pt was found to have WBC 7.3, troponin level 6, lactic acid 1.1, D-dimer -0.39, GFR> 60, temperature 97.8, blood pressure 118/87, heart rate 106, 102, RR 26, 20, oxygen sat 93-100% on 4 L oxygen.  Chest x-ray showed COPD without infiltration.  Patient is admitted to PCU as inpatient   EKG: I have personally reviewed.  Sinus rhythm, QTc 482, right atrial enlargement.   Nonspecific T wave change.  Review of Systems:   General: no fevers, chills, no body weight gain, has fatigue HEENT: no blurry vision, hearing changes. As throat itches Respiratory: has dyspnea, coughing, wheezing CV: no chest pain, no palpitations GI: no nausea, vomiting, abdominal pain, diarrhea, constipation GU: no dysuria, burning on urination, increased urinary frequency, hematuria  Ext: no leg edema Neuro: no unilateral weakness, numbness, or tingling, no vision change or hearing loss Skin: no rash, no skin tear. MSK: No muscle spasm, no deformity, no limitation of range of movement in spin Heme: No easy bruising.  Travel history: No recent long distant travel.   Allergy:  Allergies  Allergen Reactions   Desvenlafaxine Anaphylaxis   Morphine And Related Anaphylaxis   Penicillins Anaphylaxis    Has patient had a PCN reaction causing immediate rash, facial/tongue/throat swelling, SOB or lightheadedness with hypotension: Yes Has patient had a PCN reaction causing severe rash involving mucus membranes or skin necrosis: No Has patient had a PCN reaction that required hospitalization: Unknown Has patient had a PCN reaction occurring within the last 10 years: No If all of the above answers are "NO", then may proceed with Cephalosporin use.    Prazosin Other (See Comments)   Tamoxifen Anaphylaxis   Trazodone Anaphylaxis   Clonazepam    Codeine Swelling   Duloxetine Itching   Gabapentin Hives    Rash and swelling.    Hydroxyzine Itching   Lorazepam  Patient feels this medications makes her to sedated and wants to avoid   Paroxetine Hcl Hives   Sulfa Antibiotics Itching   Cephalexin Rash    Past Medical History:  Diagnosis Date   Anxiety    Asthma    Breast cancer ([place]) 2004   left breast   Chronic back pain    COPD (chronic obstructive pulmonary disease) ([place])    Depression    History of kidney surgery    Personal history of radiation therapy    PTSD  (post-traumatic stress disorder)     Past Surgical History:  Procedure Laterality Date   BREAST BIOPSY Left 2004   positive   BREAST BIOPSY Right    neg   BREAST LUMPECTOMY Left 2004   positive   LEFT HEART CATH AND CORONARY ANGIOGRAPHY N/A 11/06/2021   Procedure: LEFT HEART CATH AND CORONARY ANGIOGRAPHY;  Surgeon: Corey Skains, MD;  Location: [facility];  Service: Cardiovascular;  Laterality: N/A;   MASTECTOMY Left    MASTECTOMY     NEPHRECTOMY Left    TUBAL LIGATION      Social History:  reports that she has been smoking cigarettes. She has been smoking an average of .5 packs per day. She has never been exposed to tobacco smoke. She has never used smokeless tobacco. She reports that she does not drink alcohol and does not use drugs.  Family History:  Family History  Problem Relation Age of Onset   Breast cancer Cousin    Breast cancer Cousin    Bipolar disorder Daughter    Drug abuse Daughter    Alcohol abuse Maternal Grandfather      Prior to Admission medications   Medication Sig Start Date End Date Taking? Authorizing Provider  albuterol (VENTOLIN HFA) 108 (90 Base) MCG/ACT inhaler Inhale 1-2 puffs into the lungs every 4 (four) hours as needed for cough or wheezing.    [provider]  cholecalciferol (VITAMIN D3) 25 MCG (1000 UNIT) tablet Take 1,000 Units by mouth daily.    [provider]  citalopram (CELEXA) 10 MG tablet Take 1 tablet (10 mg total) by mouth daily. 02/20/22   Ursula Alert, MD  cyclobenzaprine (FLEXERIL) 10 MG tablet Take 1 tablet (10 mg total) by mouth 3 (three) times daily as needed for muscle spasms. 03/06/22   Gillis Santa, MD  denosumab (PROLIA) 60 MG/ML SOSY injection Inject 60 mg into the skin every 6 (six) months.    [provider]  diazepam (VALIUM) 2 MG tablet Take 1 tablet (2 mg total) by mouth daily as needed for anxiety. For severe panic symptoms 01/29/22   Ursula Alert, MD  EPINEPHrine 0.3  mg/0.3 mL IJ SOAJ injection Inject 0.3 mg into the muscle as needed for anaphylaxis.    [provider]  fluticasone (FLONASE) 50 MCG/ACT nasal spray Place 2 sprays into both nostrils daily. 10/23/21   Pokhrel, Corrie Mckusick, MD  Fluticasone-Umeclidin-Vilant (TRELEGY ELLIPTA) 100-62.5-25 MCG/INH AEPB Inhale 1 puff into the lungs daily.    [provider]  guaiFENesin-dextromethorphan (ROBITUSSIN DM) 100-10 MG/5ML syrup Take 10 mLs by mouth every 4 (four) hours as needed for cough. 02/14/22   Lorella Nimrod, MD  ipratropium (ATROVENT) 0.02 % nebulizer solution Take 0.25 mg by nebulization 4 (four) times daily.    [provider]  levofloxacin (LEVAQUIN) 750 MG tablet Take 1 tablet (750 mg total) by mouth daily. 03/12/22   Wouk, Ailene Rud, MD  mirtazapine (REMERON) 15 MG tablet Take 1  tablet (15 mg total) by mouth at bedtime. 02/20/22   Ursula Alert, MD  QUEtiapine (SEROQUEL) 200 MG tablet Take 1 tablet (200 mg total) by mouth at bedtime. 02/20/22   Ursula Alert, MD  vitamin B-12 (CYANOCOBALAMIN) 500 MCG tablet Take 500 mcg by mouth daily.    [provider]    Physical Exam: Vitals:   03/30/22 1450 03/30/22 1516 03/30/22 1602 03/30/22 1631  BP: 110/71   103/69  Pulse: (!) 105  (!) 104 99  Resp: 20  (!) 22 18  Temp: 98.3 F (36.8 C)   98.1 F (36.7 C)  TempSrc: Oral   Oral  SpO2: 94%  92% 95%  Weight:  52.7 kg    Height:  5' (1.524 m)     General: Not in acute distress HEENT:       Eyes: PERRL, EOMI, no scleral icterus.       ENT: No discharge from the ears and nose, no pharynx injection, no tonsillar enlargement.        Neck: No JVD, no bruit, no mass felt. Heme: No neck lymph node enlargement. Cardiac: S1/S2, RRR, No murmurs, No gallops or rubs. Respiratory: Has wheezing bilaterally GI: Soft, nondistended, nontender, no rebound pain, no organomegaly, BS present. GU: No hematuria Ext: No pitting leg edema bilaterally. 1+DP/PT pulse  bilaterally. Musculoskeletal: No joint deformities, No joint redness or warmth, no limitation of ROM in spin. Skin: No rashes.  Neuro: Alert, oriented X3, cranial nerves II-XII grossly intact, moves all extremities normally. Psych: Patient is not psychotic, no suicidal or hemocidal ideation.  Patient has anxiety.  Labs on Admission: I have personally reviewed following labs and imaging studies  CBC: Recent Labs  Lab 03/30/22 1004  WBC 7.3  HGB 13.9  HCT 43.4  MCV 93.9  PLT 220   Basic Metabolic Panel: Recent Labs  Lab 03/30/22 1045  NA 132*  K 4.5  CL 95*  CO2 28  GLUCOSE 146*  BUN 5*  CREATININE 0.48  CALCIUM 8.7*   GFR: Estimated Creatinine Clearance: 53 mL/min (by C-G formula based on SCr of 0.48 mg/dL). Liver Function Tests: No results for input(s): AST, ALT, ALKPHOS, BILITOT, PROT, ALBUMIN in the last 168 hours. No results for input(s): LIPASE, AMYLASE in the last 168 hours. No results for input(s): AMMONIA in the last 168 hours. Coagulation Profile: No results for input(s): INR, PROTIME in the last 168 hours. Cardiac Enzymes: No results for input(s): CKTOTAL, CKMB, CKMBINDEX, TROPONINI in the last 168 hours. BNP (last 3 results) No results for input(s): PROBNP in the last 8760 hours. HbA1C: No results for input(s): HGBA1C in the last 72 hours. CBG: No results for input(s): GLUCAP in the last 168 hours. Lipid Profile: No results for input(s): CHOL, HDL, LDLCALC, TRIG, CHOLHDL, LDLDIRECT in the last 72 hours. Thyroid Function Tests: No results for input(s): TSH, T4TOTAL, FREET4, T3FREE, THYROIDAB in the last 72 hours. Anemia Panel: No results for input(s): VITAMINB12, FOLATE, FERRITIN, TIBC, IRON, RETICCTPCT in the last 72 hours. Urine analysis:    Component Value Date/Time   COLORURINE STRAW (A) 03/20/2022 1149   APPEARANCEUR CLEAR (A) 03/20/2022 1149   LABSPEC 1.005 03/20/2022 1149   PHURINE 7.0 03/20/2022 1149   GLUCOSEU NEGATIVE 03/20/2022 1149    [place] 03/20/2022 [place] 03/20/2022 1149   KETONESUR NEGATIVE 03/20/2022 1149   PROTEINUR NEGATIVE 03/20/2022 1149   NITRITE NEGATIVE 03/20/2022 1149   LEUKOCYTESUR NEGATIVE 03/20/2022 1149   Sepsis Labs: '@LABRCNTIP'$ (procalcitonin:4,lacticidven:4) )  Recent Results (from the past 240 hour(s))  Resp Panel by RT-PCR (Flu A&B, Covid) Nasopharyngeal Swab     Status: None   Collection Time: 03/30/22  1:09 PM   Specimen: Nasopharyngeal Swab; Nasopharyngeal(NP) swabs in vial transport medium  Result Value Ref Range Status   SARS Coronavirus 2 by RT PCR NEGATIVE NEGATIVE Final    Comment: (NOTE) SARS-CoV-2 target nucleic acids are NOT DETECTED.  The SARS-CoV-2 RNA is generally detectable in upper respiratory specimens during the acute phase of infection. The lowest concentration of SARS-CoV-2 viral copies this assay can detect is 138 copies/mL. A negative result does not preclude SARS-Cov-2 infection and should not be used as the sole basis for treatment or other patient management decisions. A negative result may occur with  improper specimen collection/handling, submission of specimen other than nasopharyngeal swab, presence of viral mutation(s) within the areas targeted by this assay, and inadequate number of viral copies(<138 copies/mL). A negative result must be combined with clinical observations, patient history, and epidemiological information. The expected result is Negative.  Fact Sheet for Patients:  EntrepreneurPulse.com.au  Fact Sheet for Healthcare Providers:  IncredibleEmployment.be  This test is no t yet approved or cleared by the [place] FDA and  has been authorized for detection and/or diagnosis of SARS-CoV-2 by FDA under an Emergency Use Authorization (EUA). This EUA will remain  in effect (meaning this test can be used) for the duration of the COVID-19 declaration under Section 564(b)(1) of the  Act, 21 U.S.C.section 360bbb-3(b)(1), unless the authorization is terminated  or revoked sooner.       Influenza A by PCR NEGATIVE NEGATIVE Final   Influenza B by PCR NEGATIVE NEGATIVE Final    Comment: (NOTE) The Xpert Xpress SARS-CoV-2/FLU/RSV plus assay is intended as an aid in the diagnosis of influenza from Nasopharyngeal swab specimens and should not be used as a sole basis for treatment. Nasal washings and aspirates are unacceptable for Xpert Xpress SARS-CoV-2/FLU/RSV testing.  Fact Sheet for Patients: EntrepreneurPulse.com.au  Fact Sheet for Healthcare Providers: IncredibleEmployment.be  This test is not yet approved or cleared by the [place] FDA and has been authorized for detection and/or diagnosis of SARS-CoV-2 by FDA under an Emergency Use Authorization (EUA). This EUA will remain in effect (meaning this test can be used) for the duration of the COVID-19 declaration under Section 564(b)(1) of the Act, 21 U.S.C. section 360bbb-3(b)(1), unless the authorization is terminated or revoked.  Performed at [facility], [address] [place], [place] [address]      Radiological Exams on Admission: DG Chest [facility] 1 View  Result Date: 03/30/2022 CLINICAL DATA:  Shortness of breath. EXAM: PORTABLE CHEST 1 VIEW COMPARISON:  03/20/2022 and prior studies FINDINGS: The cardiomediastinal silhouette is unremarkable. COPD/emphysema changes are again noted. There is no evidence of focal airspace disease, pulmonary edema, suspicious pulmonary nodule/mass, pleural effusion, or pneumothorax. No acute bony abnormalities are identified. Remote RIGHT rib fracture again noted. 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 Electronically Signed   By: Margarette Canada M.D.   On: 03/30/2022 10:22      Assessment/Plan Principal Problem:   COPD exacerbation (HCC) Active Problems:   Depression with anxiety    Panic attack   Tobacco use disorder   Principal Problem:   COPD exacerbation ([place]) Active Problems:   Depression with anxiety   Panic attack   Tobacco use disorder   Assessment and Plan: * COPD exacerbation ([place]) Acute on chronic respiratory failure with hypoxia due  to COPD exacerbation: Patient has cough, wheezing, CXR showed COPD, clinically consistent with COPD exacerbation.  - will admit to progressive unit as inpatient -Bronchodilators -Solu-Medrol 40 mg IV bid --Abx: Patient just completed course of Levaquin, no fever or leukocytosis, no significant productive cough, will hold off antibiotics today -Mucinex for cough  -Incentive spirometry -sputum culture -Nasal cannula oxygen as needed to maintain O2 saturation 93% or greater   Depression with anxiety See below in panic attack  Panic attack Panic attack and depression and anxiety: Patient seems to have had a panic attack.  Consulted psychiatry, NP, Waylan Boga -Celexa, Remeron, Seroquel, Valium  Tobacco use disorder - Nicotine patch             DVT ppx: SQ Lovenox  Code Status: Full code  Family Communication: not done, no family member is at bed side.   Disposition Plan:  Anticipate discharge back to previous environment  Consults called:  none  Admission status and Level of care: Progressive:   as inpt      Severity of Illness:  The appropriate patient status for this patient is INPATIENT. Inpatient status is judged to be reasonable and necessary in order to provide the required intensity of service to ensure the patient's safety. The patient's presenting symptoms, physical exam findings, and initial radiographic and laboratory data in the context of their chronic comorbidities is felt to place them at high risk for further clinical deterioration. Furthermore, it is not anticipated that the patient will be medically stable for discharge from the hospital within 2 midnights of admission.   *  I certify that at the point of admission it is my clinical judgment that the patient will require inpatient hospital care spanning beyond 2 midnights from the point of admission due to high intensity of service, high risk for further deterioration and high frequency of surveillance required.*       Date of Service 03/30/2022    Ivor Costa [facility]   If 7PM-7AM, please contact night-coverage [email or web address] 03/30/2022, 5:08 PM

## 2022-03-30 NOTE — ED Provider Notes (Signed)
[facility] Provider Note    Event Date/Time   First MD Initiated Contact with Patient 03/30/22 1001     (approximate)   History   Respiratory Distress   HPI  Colleen [name] is a 61 y.o. female who on review of history and physical summary from April 30 COPD and ongoing tobacco use  Patient was recently admitted.  Today she reports she had 2 days of increased shortness of breath and wheezing.  She called 911 today when it felt like it was getting worse.  She is using breathing treatments at home.  Continues to smoke  EMS arrived found the patient to be hypoxic saturation somewhere in the 80s% which improved with oxygen, she was given DuoNeb, 125 mg Solu-Medrol, and then the patient started to report that she had a feeling like she was having difficulty swallowing and she felt itchy and EMS thus provided epinephrine 0.3 mg IM as well as 50 mg of IV Benadryl.  She was wheezing initially with EMS.  Patient reports at this time she feels like she has difficulty clearing postnasal drip.  She does still continue to feel somewhat short of breath but is better.  No pain anywhere.  No chest pain.  Patient reports she has had similar symptoms happen many times in the past     Physical Exam   Triage Vital Signs: ED Triage Vitals  Enc Vitals Group     BP 03/30/22 1006 132/90     Pulse Rate 03/30/22 1006 (!) 103     Resp 03/30/22 1006 (!) 26     Temp 03/30/22 1006 97.8 F (36.6 C)     Temp Source 03/30/22 1006 Oral     SpO2 03/30/22 0958 95 %     Weight --      Height --      Head Circumference --      Peak Flow --      Pain Score 03/30/22 1003 9     Pain Loc --      Pain Edu? --      Excl. in [place]? --     Most recent vital signs: Vitals:   03/30/22 1100 03/30/22 1200  BP: 102/76 118/87  Pulse: (!) 106 (!) 102  Resp: (!) 26 20  Temp:    SpO2: 96% 93%     General: Awake, tachypneic, moderate use of accessory muscles CV:  Good peripheral  perfusion.  Mild tachycardia no murmur Resp:  Normal effort.  Tachypnea, mild end expiratory wheezing.  Use of accessory muscles.  Sitting up slight tripoding position Abd:  No distention.  Other:  Patient appears quite anxious   ED Results / Procedures / Treatments   Labs (all labs ordered are listed, but only abnormal results are displayed) Labs Reviewed  BLOOD GAS, VENOUS - Abnormal; Notable for the following components:      Result Value   pCO2, Ven 61 (*)    Bicarbonate 32.9 (*)    Acid-Base Excess 5.2 (*)    All other components within normal limits  BASIC METABOLIC PANEL - Abnormal; Notable for the following components:   Sodium 132 (*)    Chloride 95 (*)    Glucose, Bld 146 (*)    BUN 5 (*)    Calcium 8.7 (*)    All other components within normal limits  CULTURE, BLOOD (ROUTINE X 2)  CULTURE, BLOOD (ROUTINE X 2)  EXPECTORATED SPUTUM ASSESSMENT W GRAM STAIN, RFLX TO RESP C  CBC  LACTIC ACID, PLASMA  PROCALCITONIN  D-DIMER, QUANTITATIVE (NOT AT [facility])  TROPONIN I (HIGH SENSITIVITY)     EKG  interpreted by me at 1005 Heart rate 105 QRS 99 QTc 480 Sinus tachycardia no evidence of acute ischemia   RADIOLOGY  Chest x-ray interpreted by me as negative for acute finding, some hyperexpansion.  No obvious infiltrate or pneumothorax   PROCEDURES:  Critical Care performed: No  Procedures   MEDICATIONS ORDERED IN ED: Medications  ipratropium (ATROVENT) nebulizer solution 0.5 mg (has no administration in time range)  levalbuterol (XOPENEX) nebulizer solution 0.63 mg (has no administration in time range)  dextromethorphan-guaiFENesin (MUCINEX DM) 30-600 MG per 12 hr tablet 1 tablet (has no administration in time range)  nicotine (NICODERM CQ - dosed in mg/24 hours) patch 21 mg (has no administration in time range)  ondansetron (ZOFRAN) injection 4 mg (has no administration in time range)  acetaminophen (TYLENOL) tablet 650 mg (has no administration in time range)   diphenhydrAMINE (BENADRYL) injection 25 mg (has no administration in time range)  enoxaparin (LOVENOX) injection 40 mg (has no administration in time range)  diazepam (VALIUM) tablet 2 mg (has no administration in time range)  methylPREDNISolone sodium succinate (SOLU-MEDROL) 125 mg/2 mL injection 80 mg (has no administration in time range)  levalbuterol (XOPENEX) nebulizer solution 1.25 mg (1.25 mg Nebulization Given 03/30/22 1012)  diazepam (VALIUM) tablet 2 mg (2 mg Oral Given 03/30/22 1145)  diazepam (VALIUM) tablet 2 mg (2 mg Oral Given 03/30/22 1228)  albuterol (PROVENTIL) (2.5 MG/3ML) 0.083% nebulizer solution 2.5 mg (2.5 mg Nebulization Given 03/30/22 1228)     IMPRESSION / MDM / ASSESSMENT AND PLAN / ED COURSE  I reviewed the triage vital signs and the nursing notes.                              Differential diagnosis includes, but is not limited to, COPD exacerbation, pulmonary embolism, pneumonia, pneumothorax, anxiety state, reactive airway disease etc.  No evidence of ACS, no associated chest pain.  The patient is on the cardiac monitor to evaluate for evidence of arrhythmia and/or significant heart rate changes.  No clinical signs or symptoms of pulmonary embolism noted except for 3 presentations with dyspnea, felt low pretest probability and D-dimer is exclusionary.  Procalcitonin is also interpreted as low decreasing risk for associated pulmonary pneumonia  ----------------------------------------- 1:06 PM on 03/30/2022 ----------------------------------------- Patient has called work of breathing has improved markedly after treatments as well as anxiolysis.  Her lung sounds have improved still only some very mild wheezing but lung expansion and work of breathing is normalized respiratory rate 20 and she is much calmer.  Discussed with patient, patient would also be interested in obtaining a psychiatry consult she has multiple stressors in her life at present.  Discussed her  case and care with Dr. Blaine Hamper, given the nature of her presentation history of known COPD and need for multiple treatments for control emergency department of her symptomatology will plan to admit for further evaluation overnight.     FINAL CLINICAL IMPRESSION(S) / ED DIAGNOSES   Final diagnoses:  COPD exacerbation ([place])  Anxiety state     Rx / DC Orders   ED Discharge Orders     None        Note:  This document was prepared using Dragon voice recognition software and may include unintentional dictation errors.   Delman Kitten, MD 03/30/22 1308

## 2022-03-30 NOTE — ED Notes (Signed)
Niu, MD at bedside. °

## 2022-03-30 NOTE — ED Notes (Signed)
Patient eating lunch tray at this time °

## 2022-03-30 NOTE — ED Triage Notes (Signed)
Patient to ED via [facility] from home in respiratory distress. Patient also c/o a burning sensation in throat. Patient was initially 86% on room air. EMS given '125mg'$  solu-medrol, .3 epi, 2 neb treatments, 50 benadryl, 4 zofran.

## 2022-03-30 NOTE — ED Notes (Signed)
Lab at bedside to recollect blue top.

## 2022-03-30 NOTE — Assessment & Plan Note (Addendum)
Acute on chronic respiratory failure with hypoxia due to COPD exacerbation: Patient has cough, wheezing, CXR showed COPD, clinically consistent with COPD exacerbation.  - will admit to progressive unit as inpatient -Bronchodilators -Solu-Medrol 40 mg IV bid --Abx: Patient just completed course of Levaquin, no fever or leukocytosis, no significant productive cough, will hold off antibiotics today -Mucinex for cough  -Incentive spirometry -sputum culture -Nasal cannula oxygen as needed to maintain O2 saturation 93% or greater

## 2022-03-30 NOTE — Progress Notes (Signed)
Initial Nutrition Assessment  DOCUMENTATION CODES:   Not applicable  INTERVENTION:   -MVI with minerals daily -Ensure Enlive po BID, each supplement provides 350 kcal and 20 grams of protein -Liberalize diet to regular for widest variety of meal selections  NUTRITION DIAGNOSIS:   Increased nutrient needs related to chronic illness (COPD) as evidenced by estimated needs.  GOAL:   Patient will meet greater than or equal to 90% of their needs  MONITOR:   PO intake, Supplement acceptance  REASON FOR ASSESSMENT:   Malnutrition Screening Tool    ASSESSMENT:   Pt with PMH of COPD and ongoing tobacco use. Pt admitted with COPD exacerbation.  Pt admitted with COPD exacerbation.  Pt unavailable at time of visit.  RD unable to obtain further nutrition-related history or complete nutrition-focused physical exam at this time.    Pt familiar to this RD from recent prior admission. Pt with history of poor oral intake due to social stressors.    Pt currently on a heart healthy diet. No meal completion data available to assess at this time.   Reviewed wt hx; wt has been stable over the past 3 months.   Medications reviewed and include solu-medrol.   Lab Results  Component Value Date   HGBA1C 5.8 (H) 12/18/2021   PTA DM medications are none.   Labs reviewed: Na: 132, CBGS: 101 (inpatient orders for glycemic control are none).    Diet Order:   Diet Order             Diet Heart Room service appropriate? Yes; Fluid consistency: Thin  Diet effective now                   EDUCATION NEEDS:   No education needs have been identified at this time  Skin:  Skin Assessment: Reviewed RN Assessment  Last BM:  Unknown  Height:   Ht Readings from Last 1 Encounters:  03/20/22 5' (1.524 m)    Weight:   Wt Readings from Last 1 Encounters:  03/20/22 56.2 kg    Ideal Body Weight:  45.5 kg  BMI:  There is no height or weight on file to calculate BMI.  Estimated  Nutritional Needs:   Kcal:  1600-1800  Protein:  80-95 grams  Fluid:  > 1.6 L    Loistine Chance, RD, LDN, [place] Registered Dietitian II Certified Diabetes Care and Education Specialist Please refer to [facility] for RD and/or RD on-call/weekend/after hours pager

## 2022-03-30 NOTE — Assessment & Plan Note (Addendum)
Panic attack and depression and anxiety: Patient seems to have had a panic attack.  Consulted psychiatry, NP, Waylan Boga -Celexa, Remeron, Seroquel, Valium

## 2022-03-30 NOTE — Assessment & Plan Note (Signed)
-  Nicotine patch 

## 2022-03-30 NOTE — Assessment & Plan Note (Signed)
See below in panic attack

## 2022-03-31 ENCOUNTER — Telehealth: Admitting: Psychiatry

## 2022-03-31 DIAGNOSIS — J441 Chronic obstructive pulmonary disease with (acute) exacerbation: Secondary | ICD-10-CM | POA: Diagnosis not present

## 2022-03-31 MED ORDER — PREDNISONE 50 MG PO TABS: 50.0000 mg | ORAL_TABLET | Freq: Every day | ORAL | Status: DC

## 2022-03-31 MED ORDER — IPRATROPIUM BROMIDE 0.02 % IN SOLN
0.5000 mg | Freq: Three times a day (TID) | RESPIRATORY_TRACT | Status: DC
  Administered 2022-03-31 (×2): 0.5 mg via RESPIRATORY_TRACT
  Filled 2022-03-31 (×2): qty 2.5

## 2022-03-31 MED ORDER — PREDNISONE 20 MG PO TABS: 40.0000 mg | ORAL_TABLET | Freq: Every day | ORAL | 0 refills | Status: AC

## 2022-03-31 NOTE — Discharge Summary (Signed)
Physician Discharge Summary   Patient: Colleen [name] MRN: [id number] DOB: [birth year]  Admit date:     03/30/2022  Discharge date: 03/31/22  Discharge Physician: Fritzi Mandes   PCP: Remi Haggard, FNP   Recommendations at discharge:    F/u PCP in 1-2 weeks F/u Dr Shea Evans psychiatry--reschedule  your appt  Discharge Diagnoses: acute on chronic COPD exacerbation. Chronic anxiety  Hospital Course:  Colleen [name] is a 62 y.o. female with medical history significant of COPD on 2 L nocturnal oxygen, asthma, depression, anxiety, PTSD, panic attack, left breast cancer (s/p of radiation therapy), tobacco abuse, CAD, who presents with shortness of breath.  Acute on chronic respiratory failure due to COPD exacerbation ongoing tobacco abuse -- sets are 94% on 2 L nasal cannula. No wheezing. Change Solu-Medrol to steroids for a few days. Continue inhalers and bronchodilators -- patient advised smoking cessation -- use oxygen as before -- no indication for antibiotic  depression with anxiety panic attack -- seen by psychiatry nurse practitioner. Recommends continue psych meds as prescribed by outpatient psychiatrist Dr.Eappen. -- pt Tells me she does not like how she feels with Remeron (recently started as per report) I have discontinued for now. -- No suicidal ideation -- she is having issues with adjusting with her husband at home. Recommend to follow-up with her counselor is outpatient -- continue Celexa, Seroquel, Valium  overall hemodynamically stable. Will discharge to home. No family at bedside     Consultants: psychiatry Procedures performed: none Disposition: Home Diet recommendation:  Discharge Diet Orders (From admission, onward)     Start     Ordered   03/31/22 0000  Diet - low sodium heart healthy        03/31/22 1443           Cardiac diet DISCHARGE MEDICATION: Allergies as of 03/31/2022       Reactions   Desvenlafaxine Anaphylaxis   Morphine And Related  Anaphylaxis   Penicillins Anaphylaxis   Has patient had a PCN reaction causing immediate rash, facial/tongue/throat swelling, SOB or lightheadedness with hypotension: Yes Has patient had a PCN reaction causing severe rash involving mucus membranes or skin necrosis: No Has patient had a PCN reaction that required hospitalization: Unknown Has patient had a PCN reaction occurring within the last 10 years: No If all of the above answers are "NO", then may proceed with Cephalosporin use.   Prazosin Other (See Comments)   Tamoxifen Anaphylaxis   Trazodone Anaphylaxis   Clonazepam    Codeine Swelling   Duloxetine Itching   Gabapentin Hives   Rash and swelling.    Hydroxyzine Itching   Lorazepam    Patient feels this medications makes her to sedated and wants to avoid   Paroxetine Hcl Hives   Sulfa Antibiotics Itching   Cephalexin Rash        Medication List     STOP taking these medications    levofloxacin 750 MG tablet Commonly known as: LEVAQUIN   mirtazapine 15 MG tablet Commonly known as: REMERON       TAKE these medications    albuterol 108 (90 Base) MCG/ACT inhaler Commonly known as: VENTOLIN HFA Inhale 1-2 puffs into the lungs every 4 (four) hours as needed for cough or wheezing.   cholecalciferol 25 MCG (1000 UNIT) tablet Commonly known as: VITAMIN D3 Take 1,000 Units by mouth daily.   citalopram 10 MG tablet Commonly known as: CELEXA Take 1 tablet (10 mg total) by mouth daily.   cyclobenzaprine 10  MG tablet Commonly known as: FLEXERIL Take 1 tablet (10 mg total) by mouth 3 (three) times daily as needed for muscle spasms.   denosumab 60 MG/ML Sosy injection Commonly known as: PROLIA Inject 60 mg into the skin every 6 (six) months.   diazepam 2 MG tablet Commonly known as: VALIUM Take 1 tablet (2 mg total) by mouth daily as needed for anxiety. For severe panic symptoms   EPINEPHrine 0.3 mg/0.3 mL Soaj injection Commonly known as: EPI-PEN Inject 0.3  mg into the muscle as needed for anaphylaxis.   fluticasone 50 MCG/ACT nasal spray Commonly known as: FLONASE Place 2 sprays into both nostrils daily.   guaiFENesin-dextromethorphan 100-10 MG/5ML syrup Commonly known as: ROBITUSSIN DM Take 10 mLs by mouth every 4 (four) hours as needed for cough.   ipratropium 0.02 % nebulizer solution Commonly known as: ATROVENT Take 0.25 mg by nebulization 4 (four) times daily.   predniSONE 20 MG tablet Commonly known as: DELTASONE Take 2 tablets (40 mg total) by mouth daily with breakfast for 4 days.   QUEtiapine 200 MG tablet Commonly known as: SEROQUEL Take 1 tablet (200 mg total) by mouth at bedtime.   Trelegy Ellipta 100-62.5-25 MCG/ACT Aepb Generic drug: Fluticasone-Umeclidin-Vilant Inhale 1 puff into the lungs daily.   vitamin B-12 500 MCG tablet Commonly known as: CYANOCOBALAMIN Take 500 mcg by mouth daily.        Follow-up Information     Remi Haggard, FNP. Schedule an appointment as soon as possible for a visit in 1 week(s).   Specialty: Family Medicine Why: hospital f/u Contact information: [place] [address] [phone]                Discharge Exam: Danley Danker Weights   03/30/22 1516  Weight: 52.7 kg     Condition at discharge: fair  The results of significant diagnostics from this hospitalization (including imaging, microbiology, ancillary and laboratory) are listed below for reference.   Imaging Studies: DG Chest 2 View  Result Date: 03/20/2022 CLINICAL DATA:  Worsening shortness of breath for a couple days, recent admission for pulmonary edema, history asthma, breast cancer, COPD, smoker EXAM: CHEST - 2 VIEW COMPARISON:  03/09/2022; correlation CT chest 02/22/2022 FINDINGS: Normal heart size, mediastinal contours, and pulmonary vascularity. Emphysematous and bronchitic changes consistent with COPD. No acute infiltrate, pleural effusion, or pneumothorax. Density identified mid  lateral RIGHT chest from healing fracture of the posterior RIGHT seventh rib unchanged. Osseous demineralization. IMPRESSION: COPD changes without acute abnormalities. Healing RIGHT seventh rib fracture. Emphysema (ICD10-J43.9). Electronically Signed   By: Lavonia Dana M.D.   On: 03/20/2022 11:29   DG Chest Port 1 View  Result Date: 03/30/2022 CLINICAL DATA:  Shortness of breath. EXAM: PORTABLE CHEST 1 VIEW COMPARISON:  03/20/2022 and prior studies FINDINGS: The cardiomediastinal silhouette is unremarkable. COPD/emphysema changes are again noted. There is no evidence of focal airspace disease, pulmonary edema, suspicious pulmonary nodule/mass, pleural effusion, or pneumothorax. No acute bony abnormalities are identified. Remote RIGHT rib fracture again noted. 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 Electronically Signed   By: Margarette Canada M.D.   On: 03/30/2022 10:22   DG Chest Portable 1 View  Result Date: 03/09/2022 CLINICAL DATA:  Shortness of breath. EXAM: PORTABLE CHEST 1 VIEW COMPARISON:  02/22/2022 CT, chest radiograph prior studies FINDINGS: The cardiomediastinal silhouette is unremarkable. COPD/emphysema changes again identified. There is no evidence of focal airspace disease, pulmonary edema, suspicious pulmonary nodule/mass, pleural effusion, or pneumothorax. No acute  bony abnormalities are identified. A healing RIGHT 7th rib fracture is again identified IMPRESSION: COPD/emphysema without evidence of acute cardiopulmonary disease. Electronically Signed   By: Margarette Canada M.D.   On: 03/09/2022 11:47    Microbiology: Results for orders placed or performed during the hospital encounter of 03/30/22  Blood culture (routine x 2)     Status: None (Preliminary result)   Collection Time: 03/30/22 10:04 AM   Specimen: BLOOD  Result Value Ref Range Status   Specimen Description BLOOD RIGHT ANTECUBITAL  Final   Special Requests   Final    BOTTLES DRAWN AEROBIC AND  ANAEROBIC Blood Culture results may not be optimal due to an excessive volume of blood received in culture bottles   Culture   Final    NO GROWTH < 24 HOURS Performed at [facility], [address], [place], [place] [address]    Report Status PENDING  Incomplete  Blood culture (routine x 2)     Status: None (Preliminary result)   Collection Time: 03/30/22 10:04 AM   Specimen: BLOOD RIGHT ARM  Result Value Ref Range Status   Specimen Description BLOOD RIGHT ARM  Final   Special Requests   Final    BOTTLES DRAWN AEROBIC AND ANAEROBIC Blood Culture results may not be optimal due to an excessive volume of blood received in culture bottles   Culture   Final    NO GROWTH < 24 HOURS Performed at [facility], [address], [place], [place] [address]    Report Status PENDING  Incomplete  Resp Panel by RT-PCR (Flu A&B, Covid) Nasopharyngeal Swab     Status: None   Collection Time: 03/30/22  1:09 PM   Specimen: Nasopharyngeal Swab; Nasopharyngeal(NP) swabs in vial transport medium  Result Value Ref Range Status   SARS Coronavirus 2 by RT PCR NEGATIVE NEGATIVE Final    Comment: (NOTE) SARS-CoV-2 target nucleic acids are NOT DETECTED.  The SARS-CoV-2 RNA is generally detectable in upper respiratory specimens during the acute phase of infection. The lowest concentration of SARS-CoV-2 viral copies this assay can detect is 138 copies/mL. A negative result does not preclude SARS-Cov-2 infection and should not be used as the sole basis for treatment or other patient management decisions. A negative result may occur with  improper specimen collection/handling, submission of specimen other than nasopharyngeal swab, presence of viral mutation(s) within the areas targeted by this assay, and inadequate number of viral copies(<138 copies/mL). A negative result must be combined with clinical observations, patient history, and epidemiological information. The expected result is  Negative.  Fact Sheet for Patients:  EntrepreneurPulse.com.au  Fact Sheet for Healthcare Providers:  IncredibleEmployment.be  This test is no t yet approved or cleared by the [place] FDA and  has been authorized for detection and/or diagnosis of SARS-CoV-2 by FDA under an Emergency Use Authorization (EUA). This EUA will remain  in effect (meaning this test can be used) for the duration of the COVID-19 declaration under Section 564(b)(1) of the Act, 21 U.S.C.section 360bbb-3(b)(1), unless the authorization is terminated  or revoked sooner.       Influenza A by PCR NEGATIVE NEGATIVE Final   Influenza B by PCR NEGATIVE NEGATIVE Final    Comment: (NOTE) The Xpert Xpress SARS-CoV-2/FLU/RSV plus assay is intended as an aid in the diagnosis of influenza from Nasopharyngeal swab specimens and should not be used as a sole basis for treatment. Nasal washings and aspirates are unacceptable for Xpert Xpress SARS-CoV-2/FLU/RSV testing.  Fact  Sheet for Patients: EntrepreneurPulse.com.au  Fact Sheet for Healthcare Providers: IncredibleEmployment.be  This test is not yet approved or cleared by the [place] FDA and has been authorized for detection and/or diagnosis of SARS-CoV-2 by FDA under an Emergency Use Authorization (EUA). This EUA will remain in effect (meaning this test can be used) for the duration of the COVID-19 declaration under Section 564(b)(1) of the Act, 21 U.S.C. section 360bbb-3(b)(1), unless the authorization is terminated or revoked.  Performed at [facility], [address] [place], [place] [address]     Labs: CBC: Recent Labs  Lab 03/30/22 1004  WBC 7.3  HGB 13.9  HCT 43.4  MCV 93.9  PLT 476   Basic Metabolic Panel: Recent Labs  Lab 03/30/22 1045  NA 132*  K 4.5  CL 95*  CO2 28  GLUCOSE 146*  BUN 5*  CREATININE 0.48  CALCIUM 8.7*   Liver Function  Tests: No results for input(s): AST, ALT, ALKPHOS, BILITOT, PROT, ALBUMIN in the last 168 hours. CBG: No results for input(s): GLUCAP in the last 168 hours.  Discharge time spent: greater than 30 minutes.  Signed: Fritzi Mandes, MD [facility] 03/31/2022

## 2022-03-31 NOTE — Discharge Instructions (Signed)
Patient advised to wear her oxygen as before. She is also advised to keep her appointment for psychological counseling and reschedule her psychiatrist appointment

## 2022-04-01 ENCOUNTER — Other Ambulatory Visit: Payer: Self-pay

## 2022-04-01 ENCOUNTER — Encounter: Payer: Self-pay | Admitting: Intensive Care

## 2022-04-01 ENCOUNTER — Telehealth: Payer: Self-pay | Admitting: Psychiatry

## 2022-04-01 ENCOUNTER — Emergency Department
Admission: EM | Admit: 2022-04-01 | Discharge: 2022-04-02 | Disposition: A | Discharge status: Home / Self Care | Attending: Emergency Medicine | Admitting: Emergency Medicine

## 2022-04-01 ENCOUNTER — Emergency Department

## 2022-04-01 DIAGNOSIS — Z853 Personal history of malignant neoplasm of breast: Secondary | ICD-10-CM | POA: Diagnosis not present

## 2022-04-01 DIAGNOSIS — R0602 Shortness of breath: Secondary | ICD-10-CM | POA: Diagnosis not present

## 2022-04-01 DIAGNOSIS — J45909 Unspecified asthma, uncomplicated: Secondary | ICD-10-CM | POA: Insufficient documentation

## 2022-04-01 DIAGNOSIS — Z7951 Long term (current) use of inhaled steroids: Secondary | ICD-10-CM | POA: Insufficient documentation

## 2022-04-01 DIAGNOSIS — F411 Generalized anxiety disorder: Secondary | ICD-10-CM

## 2022-04-01 DIAGNOSIS — T7840XA Allergy, unspecified, initial encounter: Secondary | ICD-10-CM | POA: Insufficient documentation

## 2022-04-01 DIAGNOSIS — D72829 Elevated white blood cell count, unspecified: Secondary | ICD-10-CM | POA: Diagnosis not present

## 2022-04-01 DIAGNOSIS — E871 Hypo-osmolality and hyponatremia: Secondary | ICD-10-CM | POA: Diagnosis not present

## 2022-04-01 DIAGNOSIS — Z9012 Acquired absence of left breast and nipple: Secondary | ICD-10-CM | POA: Insufficient documentation

## 2022-04-01 DIAGNOSIS — F4310 Post-traumatic stress disorder, unspecified: Secondary | ICD-10-CM

## 2022-04-01 DIAGNOSIS — J441 Chronic obstructive pulmonary disease with (acute) exacerbation: Secondary | ICD-10-CM | POA: Diagnosis not present

## 2022-04-01 DIAGNOSIS — G4701 Insomnia due to medical condition: Secondary | ICD-10-CM

## 2022-04-01 LAB — COMPREHENSIVE METABOLIC PANEL
ALT: 17 U/L (ref 0–44)
AST: 20 U/L (ref 15–41)
Albumin: 4.1 g/dL (ref 3.5–5.0)
Alkaline Phosphatase: 36 U/L — ABNORMAL LOW (ref 38–126)
Anion gap: 11 (ref 5–15)
BUN: 8 mg/dL (ref 8–23)
CO2: 29 mmol/L (ref 22–32)
Calcium: 8.6 mg/dL — ABNORMAL LOW (ref 8.9–10.3)
Chloride: 87 mmol/L — ABNORMAL LOW (ref 98–111)
Creatinine, Ser: 0.43 mg/dL — ABNORMAL LOW (ref 0.44–1.00)
GFR, Estimated: >60 mL/min (ref >60)
Glucose, Bld: 105 mg/dL — ABNORMAL HIGH (ref 70–99)
Potassium: 4 mmol/L (ref 3.5–5.1)
Sodium: 127 mmol/L — ABNORMAL LOW (ref 135–145)
Total Bilirubin: 0.7 mg/dL (ref 0.3–1.2)
Total Protein: 6.7 g/dL (ref 6.5–8.1)

## 2022-04-01 LAB — CBC WITH DIFFERENTIAL/PLATELET
Abs Immature Granulocytes: 0.05 10*3/uL (ref 0.00–0.07)
Basophils Absolute: 0.1 10*3/uL (ref 0.0–0.1)
Basophils Relative: 1 %
Eosinophils Absolute: 0.1 10*3/uL (ref 0.0–0.5)
Eosinophils Relative: 1 %
HCT: 37.9 % (ref 36.0–46.0)
Hemoglobin: 12.5 g/dL (ref 12.0–15.0)
Immature Granulocytes: 0 %
Lymphocytes Relative: 21 %
Lymphs Abs: 2.5 10*3/uL (ref 0.7–4.0)
MCH: 30.2 pg (ref 26.0–34.0)
MCHC: 33 g/dL (ref 30.0–36.0)
MCV: 91.5 fL (ref 80.0–100.0)
Monocytes Absolute: 0.8 10*3/uL (ref 0.1–1.0)
Monocytes Relative: 7 %
Neutro Abs: 8.3 10*3/uL — ABNORMAL HIGH (ref 1.7–7.7)
Neutrophils Relative %: 70 %
Platelets: 292 10*3/uL (ref 150–400)
RBC: 4.14 MIL/uL (ref 3.87–5.11)
RDW: 13.4 % (ref 11.5–15.5)
WBC: 11.8 10*3/uL — ABNORMAL HIGH (ref 4.0–10.5)
nRBC: 0 % (ref 0.0–0.2)

## 2022-04-01 IMAGING — CR DG CHEST 2V
1 series · 2 of 2 positions shown · non-contrast
Comparison: [DATE]

CLINICAL DATA: Shortness of breath for a few days.

EXAM:
CHEST - 2 VIEW

[Series 1: dg chest 2 view · 0.14mm/px · 2 of 2 slices shown]
[im 1/2]
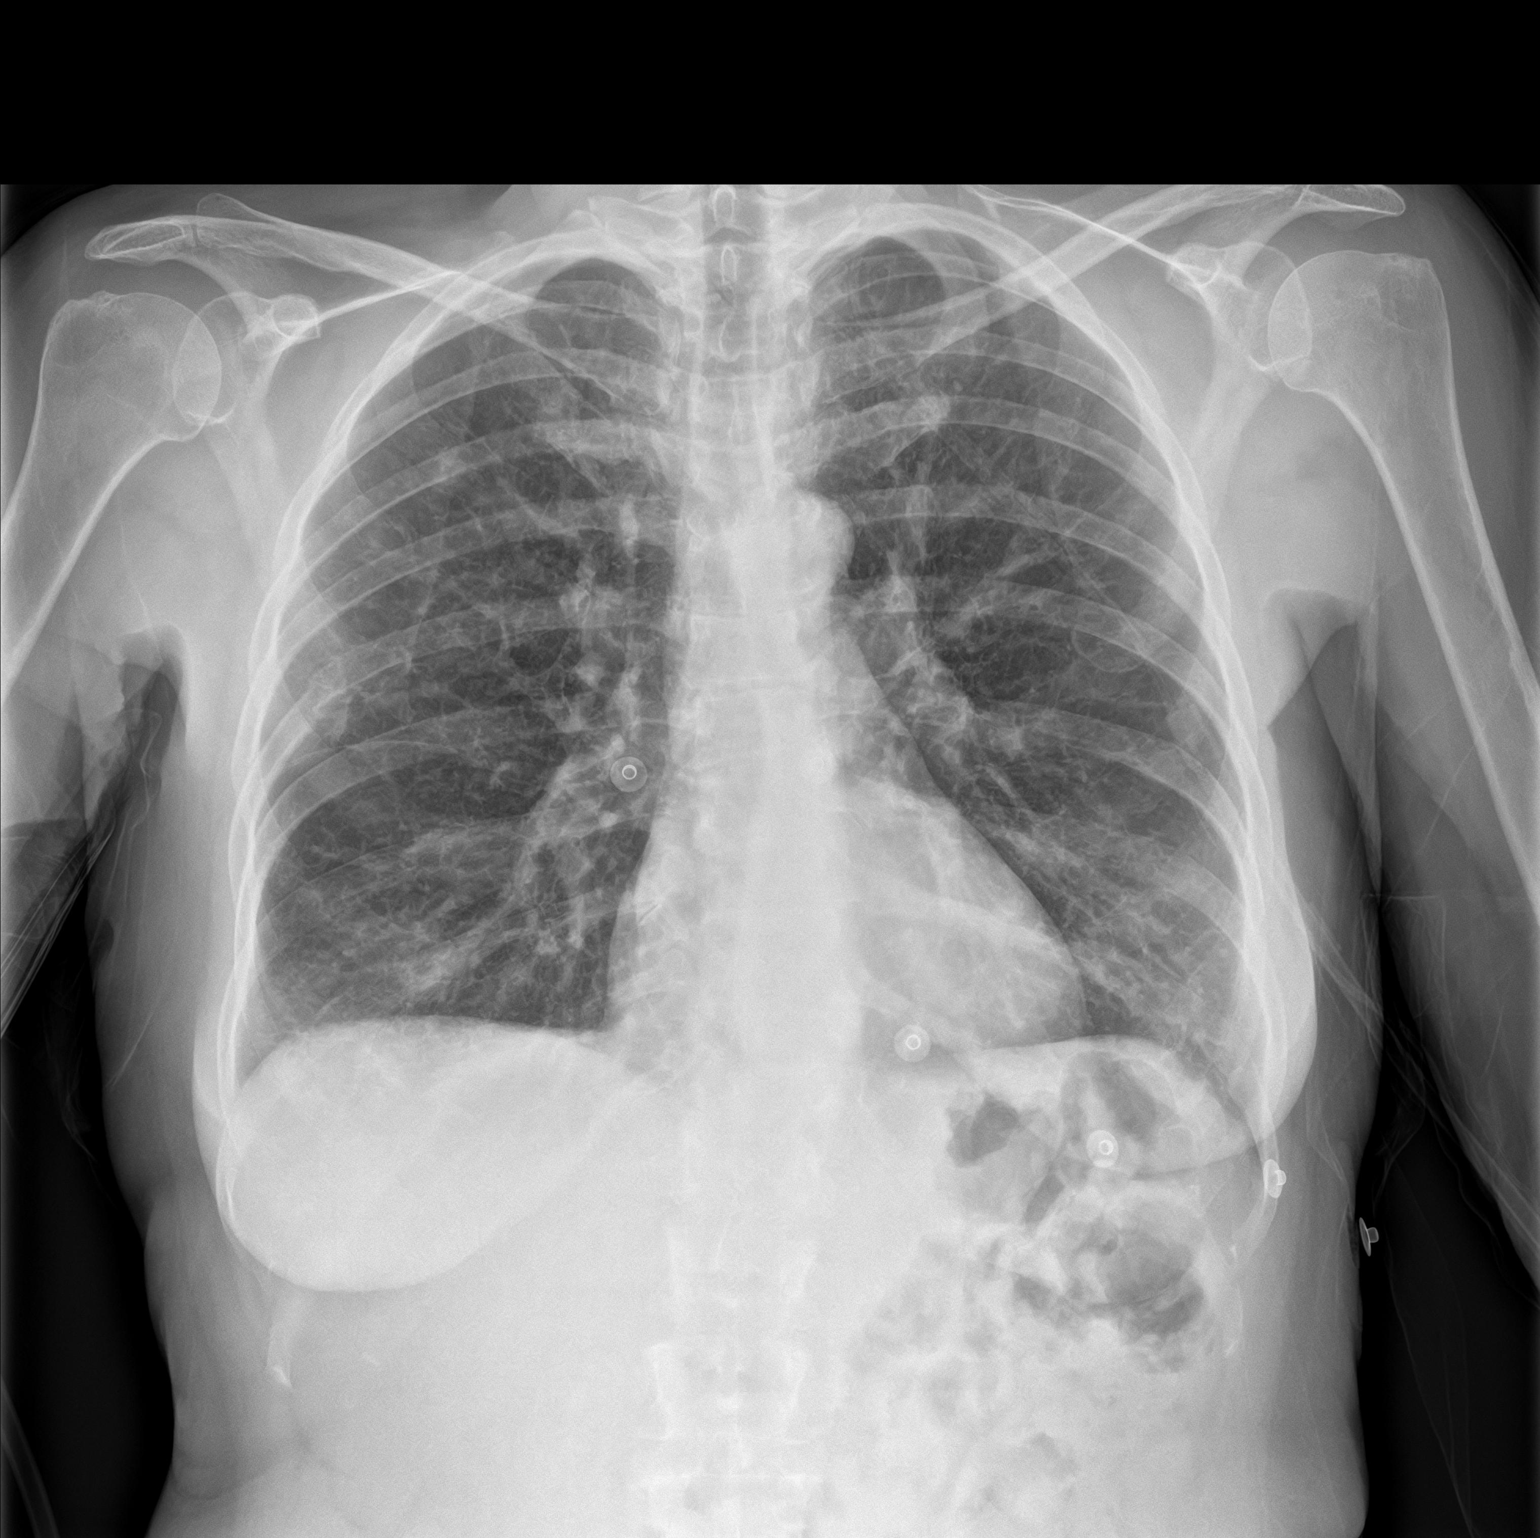
[im 2/2]
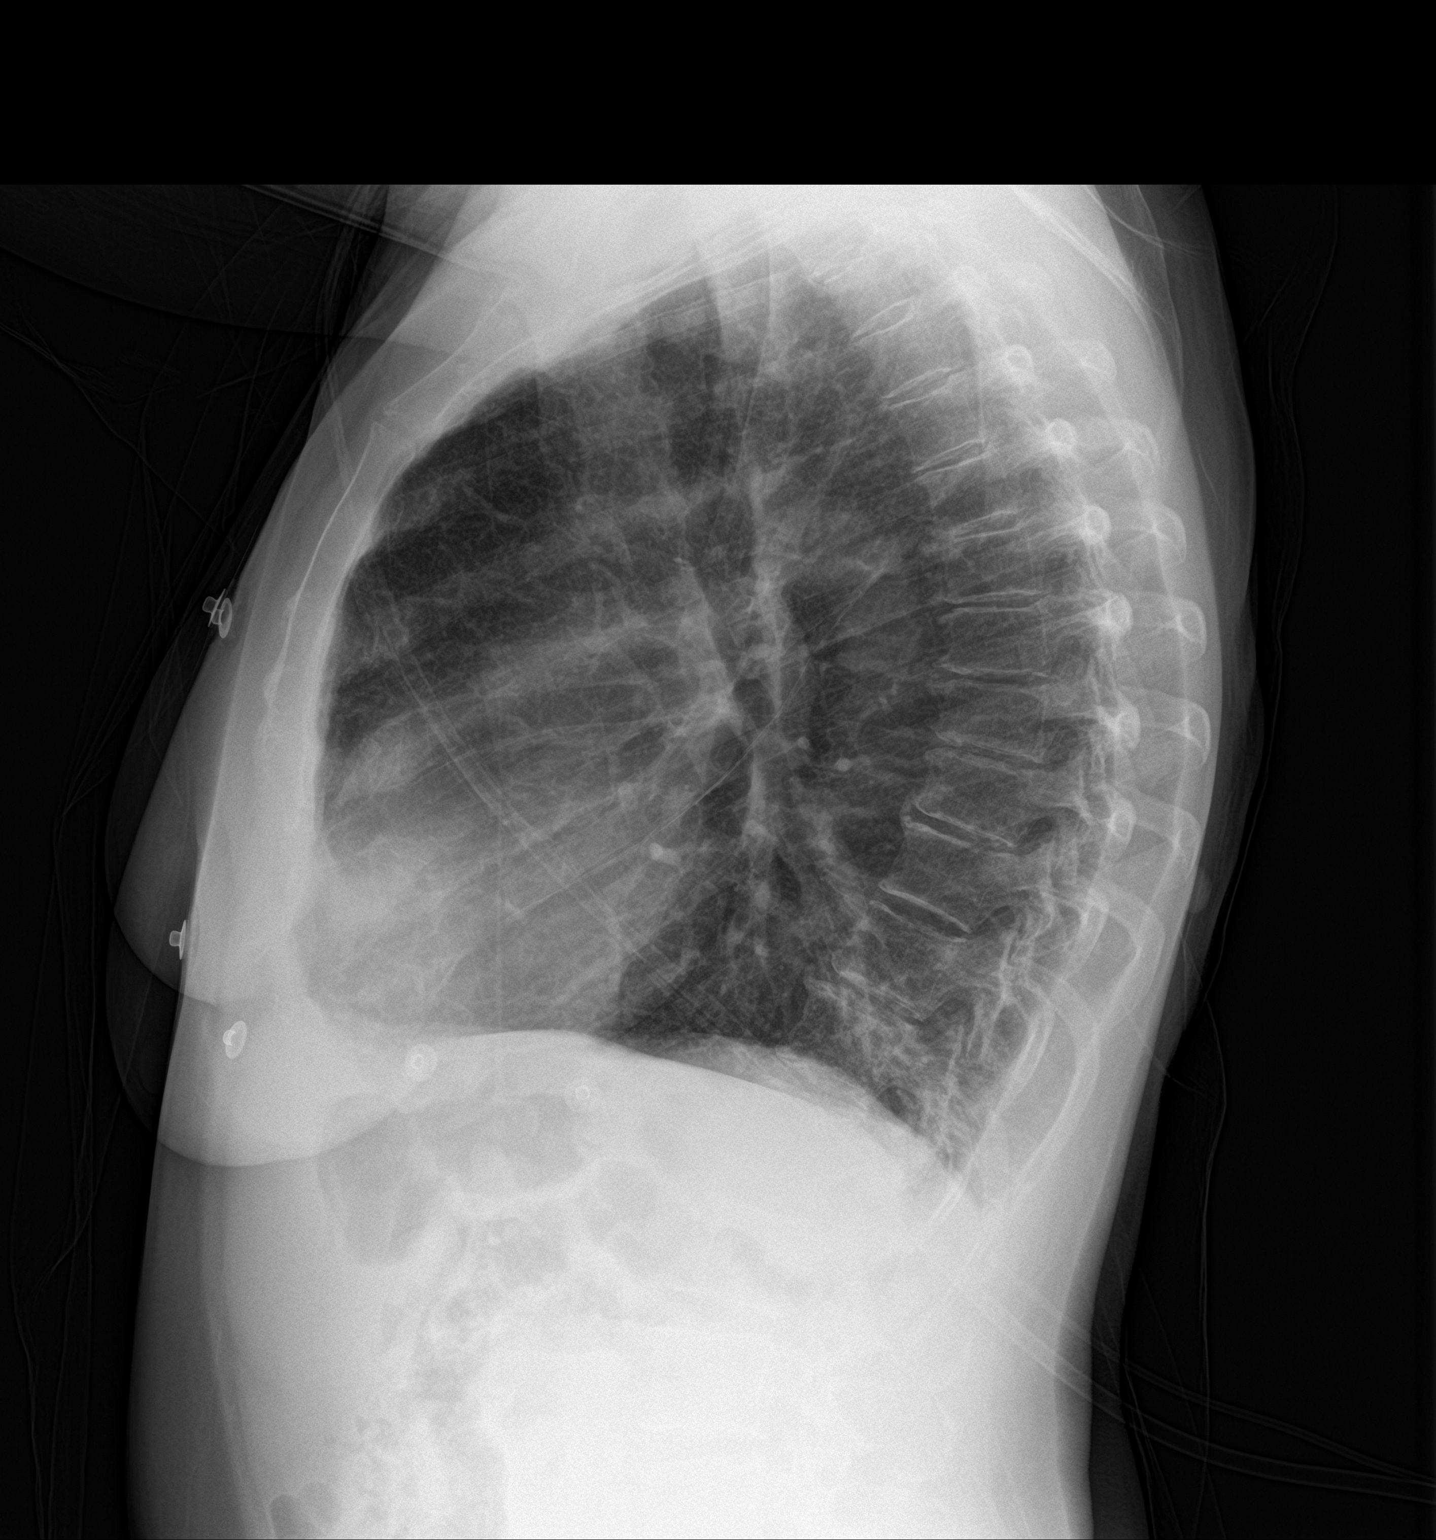

[2 of 2 positions shown; findings below may reference images not displayed]

FINDINGS: Heart size and pulmonary vascularity are normal. Bronchiectasis and
peribronchial thickening consistent with chronic bronchitis. No
airspace disease or consolidation. No pleural effusions. No
pneumothorax. Mediastinal contours appear intact. Healing fracture
of the right posterior seventh rib. Degenerative changes in the
spine.
IMPRESSION: Bronchiectasis and chronic bronchitic changes in the lungs. No edema
or consolidation.

## 2022-04-01 MED ORDER — DEXAMETHASONE SODIUM PHOSPHATE 10 MG/ML IJ SOLN
10.0000 mg | Freq: Once | INTRAMUSCULAR | Status: AC
Start: 2022-04-01 — End: 2022-04-01
  Administered 2022-04-01: 10 mg via INTRAVENOUS
  Filled 2022-04-01: qty 1

## 2022-04-01 MED ORDER — FAMOTIDINE 20 MG PO TABS
40.0000 mg | ORAL_TABLET | Freq: Once | ORAL | Status: AC
  Administered 2022-04-01: 40 mg via ORAL
  Filled 2022-04-01: qty 2

## 2022-04-01 MED ORDER — DIAZEPAM 2 MG PO TABS: 2.0000 mg | ORAL_TABLET | Freq: Every day | ORAL | 1 refills | Status: DC | PRN

## 2022-04-01 MED ORDER — SODIUM CHLORIDE 0.9 % IV BOLUS (SEPSIS)
500.0000 mL | Freq: Once | INTRAVENOUS | Status: AC
  Administered 2022-04-01: 500 mL via INTRAVENOUS

## 2022-04-01 MED ORDER — DIPHENHYDRAMINE HCL 50 MG/ML IJ SOLN
25.0000 mg | Freq: Once | INTRAMUSCULAR | Status: AC
  Administered 2022-04-01: 25 mg via INTRAVENOUS
  Filled 2022-04-01: qty 1

## 2022-04-01 NOTE — ED Provider Notes (Signed)
[facility] Provider Note    Event Date/Time   First MD Initiated Contact with Patient 04/01/22 2259     (approximate)   History   Shortness of Breath and Allergic Reaction   HPI  Vada Swift is a 61 y.o. female with history of breast cancer, COPD on 2 L of oxygen, PTSD, anxiety who presents to the emergency department with what she thinks is an allergic reaction.  States today she had episodes where she felt like her skin was burning from the inside out.  She states she would feel hot but would be cold.  She states she checked her temperature at home but did not have a fever.  Has been reports she was having goosebumps but no other rash.  No flushing.  No hives.  Symptoms improved with Benadryl at home.  No lip or tongue swelling but did have wheezing and shortness of breath.  No pain currently.  Currently feeling better.  States she has lots of allergies.  Denies any changes recently.   History provided by patient and husband.    Past Medical History:  Diagnosis Date   Anxiety    Asthma    Breast cancer ([place]) 2004   left breast   Chronic back pain    COPD (chronic obstructive pulmonary disease) ([place])    Depression    History of kidney surgery    Personal history of radiation therapy    PTSD (post-traumatic stress disorder)     Past Surgical History:  Procedure Laterality Date   BREAST BIOPSY Left 2004   positive   BREAST BIOPSY Right    neg   BREAST LUMPECTOMY Left 2004   positive   LEFT HEART CATH AND CORONARY ANGIOGRAPHY N/A 11/06/2021   Procedure: LEFT HEART CATH AND CORONARY ANGIOGRAPHY;  Surgeon: Corey Skains, MD;  Location: [place] [facility];  Service: Cardiovascular;  Laterality: N/A;   MASTECTOMY Left    MASTECTOMY     NEPHRECTOMY Left    TUBAL LIGATION      MEDICATIONS:  Prior to Admission medications   Medication Sig Start Date End Date Taking? Authorizing Provider  albuterol (VENTOLIN HFA) 108 (90 Base) MCG/ACT  inhaler Inhale 1-2 puffs into the lungs every 4 (four) hours as needed for cough or wheezing.    [provider]  cholecalciferol (VITAMIN D3) 25 MCG (1000 UNIT) tablet Take 1,000 Units by mouth daily.    [provider]  citalopram (CELEXA) 10 MG tablet Take 1 tablet (10 mg total) by mouth daily. 02/20/22   Ursula Alert, MD  cyclobenzaprine (FLEXERIL) 10 MG tablet Take 1 tablet (10 mg total) by mouth 3 (three) times daily as needed for muscle spasms. 03/06/22   Gillis Santa, MD  denosumab (PROLIA) 60 MG/ML SOSY injection Inject 60 mg into the skin every 6 (six) months.    [provider]  diazepam (VALIUM) 2 MG tablet Take 1 tablet (2 mg total) by mouth daily as needed for anxiety. For severe panic symptoms 04/01/22 05/01/22  Ursula Alert, MD  EPINEPHrine 0.3 mg/0.3 mL IJ SOAJ injection Inject 0.3 mg into the muscle as needed for anaphylaxis.    [provider]  fluticasone (FLONASE) 50 MCG/ACT nasal spray Place 2 sprays into both nostrils daily. 10/23/21   Pokhrel, Corrie Mckusick, MD  Fluticasone-Umeclidin-Vilant (TRELEGY ELLIPTA) 100-62.5-25 MCG/INH AEPB Inhale 1 puff into the lungs daily.    [provider]  guaiFENesin-dextromethorphan (ROBITUSSIN DM) 100-10 MG/5ML syrup Take 10 mLs by  mouth every 4 (four) hours as needed for cough. 02/14/22   Lorella Nimrod, MD  ipratropium (ATROVENT) 0.02 % nebulizer solution Take 0.25 mg by nebulization 4 (four) times daily.    [provider]  predniSONE (DELTASONE) 20 MG tablet Take 2 tablets (40 mg total) by mouth daily with breakfast for 4 days. 03/31/22 04/04/22  Fritzi Mandes, MD  QUEtiapine (SEROQUEL) 200 MG tablet Take 1 tablet (200 mg total) by mouth at bedtime. 02/20/22   Ursula Alert, MD  vitamin B-12 (CYANOCOBALAMIN) 500 MCG tablet Take 500 mcg by mouth daily.    [provider]    Physical Exam   Triage Vital Signs: ED Triage Vitals [04/01/22 1740]  Enc Vitals Group     BP (!) 151/99      Pulse Rate (!) 102     Resp 20     Temp 98.9 F (37.2 C)     Temp Source Oral     SpO2 99 %     Weight 114 lb (51.7 kg)     Height 5' (1.524 m)     Head Circumference      Peak Flow      Pain Score 0     Pain Loc      Pain Edu?      Excl. in [place]?     Most recent vital signs: Vitals:   04/01/22 2245 04/01/22 2300  BP: (!) 141/88 (!) 131/91  Pulse: 79 73  Resp: (!) 23 (!) 22  Temp:    SpO2: 98% 98%    CONSTITUTIONAL: Alert and oriented and responds appropriately to questions. Well-appearing; well-nourished HEAD: Normocephalic, atraumatic EYES: Conjunctivae clear, pupils appear equal, sclera nonicteric ENT: normal nose; moist mucous membranes, no angioedema, no trismus or drooling, normal phonation, no stridor NECK: Supple, normal ROM CARD: RRR; S1 and S2 appreciated; no murmurs, no clicks, no rubs, no gallops RESP: Normal chest excursion without splinting or tachypnea; breath sounds clear and equal bilaterally; no wheezes, no rhonchi, no rales, no hypoxia or respiratory distress, speaking full sentences ABD/GI: Normal bowel sounds; non-distended; soft, non-tender, no rebound, no guarding, no peritoneal signs BACK: The back appears normal EXT: Normal ROM in all joints; no deformity noted, no edema; no cyanosis SKIN: Normal color for age and race; warm; no rash on exposed skin, no urticaria NEURO: Moves all extremities equally, normal speech PSYCH: The patient's mood and manner are appropriate.   ED Results / Procedures / Treatments   LABS: (all labs ordered are listed, but only abnormal results are displayed) Labs Reviewed  CBC WITH DIFFERENTIAL/PLATELET - Abnormal; Notable for the following components:      Result Value   WBC 11.8 (*)    Neutro Abs 8.3 (*)    All other components within normal limits  COMPREHENSIVE METABOLIC PANEL - Abnormal; Notable for the following components:   Sodium 127 (*)    Chloride 87 (*)    Glucose, Bld 105 (*)    Creatinine, Ser 0.43  (*)    Calcium 8.6 (*)    Alkaline Phosphatase 36 (*)    All other components within normal limits     EKG:    Date: 04/01/2022 17:36  Rate: 97  Rhythm: normal sinus rhythm  QRS Axis: normal  Intervals: normal  ST/T Wave abnormalities: normal  Conduction Disutrbances: none  Narrative Interpretation: unremarkable     RADIOLOGY: My personal review and interpretation of imaging: Chest x-ray shows chronic bronchitic changes.  I have personally reviewed all  radiology reports.   DG Chest 2 View  Result Date: 04/01/2022 CLINICAL DATA:  Shortness of breath for a few days. EXAM: CHEST - 2 VIEW COMPARISON:  03/30/2022 FINDINGS: Heart size and pulmonary vascularity are normal. Bronchiectasis and peribronchial thickening consistent with chronic bronchitis. No airspace disease or consolidation. No pleural effusions. No pneumothorax. Mediastinal contours appear intact. Healing fracture of the right posterior seventh rib. Degenerative changes in the spine. IMPRESSION: Bronchiectasis and chronic bronchitic changes in the lungs. No edema or consolidation. Electronically Signed   By: Lucienne Capers M.D.   On: 04/01/2022 18:21     PROCEDURES:  Critical Care performed: No     .1-3 Lead EKG Interpretation Performed by: Cisco Kindt, Delice Bison, DO Authorized by: Merryl Buckels, Delice Bison, DO     Interpretation: normal     ECG rate:  73   ECG rate assessment: normal     Rhythm: sinus rhythm     Ectopy: none     Conduction: normal      IMPRESSION / MDM / ASSESSMENT AND PLAN / ED COURSE  I reviewed the triage vital signs and the nursing notes.    Patient here with concerns for an allergic reaction.  States she felt like her skin was burning from the inside out and she had wheezing.  States she is allergic to many things but denies any new exposures.  States symptoms resolved with Benadryl at home.  The patient is on the cardiac monitor to evaluate for evidence of arrhythmia and/or significant heart  rate changes.   DIFFERENTIAL DIAGNOSIS (includes but not limited to):   Allergic reaction, COPD or asthma exacerbation, pneumonia, pneumothorax, CHF, URI, fever   PLAN: We will obtain CBC, BMP, chest x-ray.  EKG nonischemic.  Lungs are currently clear to auscultation.  Will give dose of Decadron here.  She has already received IV Benadryl and Pepcid prior to my arrival.  States she is asymptomatic now.  Airway patent.  No angioedema.  No hypotension.  Does not need epinephrine.   MEDICATIONS GIVEN IN ED: Medications  dexamethasone (DECADRON) injection 10 mg (has no administration in time range)  sodium chloride 0.9 % bolus 500 mL (has no administration in time range)  diphenhydrAMINE (BENADRYL) injection 25 mg (25 mg Intravenous Given 04/01/22 1843)  famotidine (PEPCID) tablet 40 mg (40 mg Oral Given 04/01/22 1844)     ED COURSE: Patient's labs show slight leukocytosis of 11,000 with left shift.  She has been on steroids recently for COPD exacerbation.  She denies any fever and is afebrile here.  No other infectious symptoms.  Sodium level slightly low at 127.  She has had this issue before.  We will give 500 mL of IV fluid.  Otherwise other electrolytes, LFTs, renal function normal.  Chest x-ray reviewed and interpreted by myself and radiology and shows chronic bronchitic changes without pneumothorax, edema, infiltrate.  Patient states she is feeling much better.  I feel she is safe for discharge home.  Given Decadron here and does not need a steroid taper given symptoms seem mild and slightly atypical.  Recommended Benadryl as needed at home given this was helping her symptoms significantly.  Recommended close PCP follow-up.   At this time, I do not feel there is any life-threatening condition present. I reviewed all nursing notes, vitals, pertinent previous records.  All lab and urine results, EKGs, imaging ordered have been independently reviewed and interpreted by myself.  I reviewed all  available radiology reports from any imaging ordered  this visit.  Based on my assessment, I feel the patient is safe to be discharged home without further emergent workup and can continue workup as an outpatient as needed. Discussed all findings, treatment plan as well as usual and customary return precautions with patient and husband.  They verbalize understanding and are comfortable with this plan.  Outpatient follow-up has been provided as needed.  All questions have been answered.    CONSULTS: No admission at this time.  Patient is not having a COPD exacerbation has no signs of anaphylaxis.  Patient is slightly hyponatremic but this is not critically low and she has had this issue before.  She can follow-up with her primary care doctor to have this rechecked in a week.   OUTSIDE RECORDS REVIEWED: Patient was just discharged from the hospital yesterday for COPD exacerbation.         FINAL CLINICAL IMPRESSION(S) / ED DIAGNOSES   Final diagnoses:  Allergic reaction, initial encounter  Hyponatremia     Rx / DC Orders   ED Discharge Orders     None        Note:  This document was prepared using Dragon voice recognition software and may include unintentional dictation errors.   Hennessey Cantrell, Delice Bison, DO 04/01/22 2316

## 2022-04-01 NOTE — ED Triage Notes (Addendum)
Patient arrived by EMS from home for sob. Released Monday from [facility] for the same. Baseline wears 2L O2. EMS reports bases of lungs diminished. EMS administered '5mg'$  duonebs. Blood sugar 90. EMS vitals 152/98 b/p, 99% 4L O2, HR 99. Patient also c/o burning all over body that started this AM that she believes is an allergic reaction causing her sob

## 2022-04-01 NOTE — Telephone Encounter (Signed)
Received message from staff at front desk that patient call to reschedule appointment as well as requested refill on her Valium.  Valium will only be provided for shot supply-we will send to local pharmacy.  Will not sent Valium for 90 days at the time for this patient.  Will have Janett Billow CMA communicate this with patient.  Patient also needs to be compliant with psychotherapy as well as follow-up appointments with provider.

## 2022-04-01 NOTE — ED Notes (Signed)
Dr. Ward at the bedside.  

## 2022-04-01 NOTE — Discharge Instructions (Addendum)
You may take Benadryl 50 mg every 8 hours as needed for itching, burning sensation, rash.  Your sodium level today was slightly low at 127.  I recommend that you have this rechecked by your doctor in 1 week.  This is an issue you have had before.  Your sodium was 128 at the beginning of April.  Steps to find a Primary Care Provider (PCP):  Call [phone] or [phone] to access "[place] a Doctor Service."  2.  You may also go on the [facility] website at [email or web address]

## 2022-04-02 NOTE — Telephone Encounter (Signed)
pt was called and given info.

## 2022-04-04 ENCOUNTER — Emergency Department

## 2022-04-04 ENCOUNTER — Encounter: Payer: Self-pay | Admitting: Intensive Care

## 2022-04-04 ENCOUNTER — Emergency Department
Admission: EM | Admit: 2022-04-04 | Discharge: 2022-04-04 | Disposition: A | Discharge status: Home / Self Care | Attending: Emergency Medicine | Admitting: Emergency Medicine

## 2022-04-04 DIAGNOSIS — M542 Cervicalgia: Secondary | ICD-10-CM | POA: Diagnosis not present

## 2022-04-04 DIAGNOSIS — F321 Major depressive disorder, single episode, moderate: Secondary | ICD-10-CM | POA: Insufficient documentation

## 2022-04-04 DIAGNOSIS — F418 Other specified anxiety disorders: Secondary | ICD-10-CM | POA: Diagnosis not present

## 2022-04-04 DIAGNOSIS — F32A Depression, unspecified: Secondary | ICD-10-CM | POA: Diagnosis present

## 2022-04-04 DIAGNOSIS — R45851 Suicidal ideations: Secondary | ICD-10-CM | POA: Insufficient documentation

## 2022-04-04 DIAGNOSIS — M5489 Other dorsalgia: Secondary | ICD-10-CM | POA: Diagnosis not present

## 2022-04-04 LAB — URINE DRUG SCREEN, QUALITATIVE (ARMC ONLY)
Amphetamines, Ur Screen: NOT DETECTED
Barbiturates, Ur Screen: NOT DETECTED
Benzodiazepine, Ur Scrn: POSITIVE — AB
Cannabinoid 50 Ng, Ur ~~LOC~~: NOT DETECTED
Cocaine Metabolite,Ur ~~LOC~~: NOT DETECTED
MDMA (Ecstasy)Ur Screen: NOT DETECTED
Methadone Scn, Ur: NOT DETECTED
Opiate, Ur Screen: NOT DETECTED
Phencyclidine (PCP) Ur S: NOT DETECTED
Tricyclic, Ur Screen: NOT DETECTED

## 2022-04-04 LAB — CULTURE, BLOOD (ROUTINE X 2)
Culture: NO GROWTH
Culture: NO GROWTH

## 2022-04-04 LAB — CBC
HCT: 44 % (ref 36.0–46.0)
Hemoglobin: 14.4 g/dL (ref 12.0–15.0)
MCH: 30.3 pg (ref 26.0–34.0)
MCHC: 32.7 g/dL (ref 30.0–36.0)
MCV: 92.6 fL (ref 80.0–100.0)
Platelets: 341 10*3/uL (ref 150–400)
RBC: 4.75 MIL/uL (ref 3.87–5.11)
RDW: 13.5 % (ref 11.5–15.5)
WBC: 9.5 10*3/uL (ref 4.0–10.5)
nRBC: 0 % (ref 0.0–0.2)

## 2022-04-04 LAB — COMPREHENSIVE METABOLIC PANEL
ALT: 18 U/L (ref 0–44)
AST: 18 U/L (ref 15–41)
Albumin: 4.3 g/dL (ref 3.5–5.0)
Alkaline Phosphatase: 38 U/L (ref 38–126)
Anion gap: 12 (ref 5–15)
BUN: 7 mg/dL — ABNORMAL LOW (ref 8–23)
CO2: 28 mmol/L (ref 22–32)
Calcium: 9.2 mg/dL (ref 8.9–10.3)
Chloride: 93 mmol/L — ABNORMAL LOW (ref 98–111)
Creatinine, Ser: 0.4 mg/dL — ABNORMAL LOW (ref 0.44–1.00)
GFR, Estimated: >60 mL/min (ref >60)
Glucose, Bld: 108 mg/dL — ABNORMAL HIGH (ref 70–99)
Potassium: 4.5 mmol/L (ref 3.5–5.1)
Sodium: 133 mmol/L — ABNORMAL LOW (ref 135–145)
Total Bilirubin: 0.8 mg/dL (ref 0.3–1.2)
Total Protein: 7.6 g/dL (ref 6.5–8.1)

## 2022-04-04 LAB — ETHANOL: Alcohol, Ethyl (B): <10 mg/dL (ref <10)

## 2022-04-04 LAB — SALICYLATE LEVEL: Salicylate Lvl: <7 mg/dL — ABNORMAL LOW (ref 7.0–30.0)

## 2022-04-04 LAB — ACETAMINOPHEN LEVEL: Acetaminophen (Tylenol), Serum: <10 ug/mL — ABNORMAL LOW (ref 10–30)

## 2022-04-04 IMAGING — CR DG LUMBAR SPINE COMPLETE 4+V
1 series · 5 of 5 positions shown · non-contrast
Comparison: MRI lumbar spine [DATE]

CLINICAL DATA: Progressive low back pain.

EXAM:
LUMBAR SPINE - COMPLETE 4+ VIEW

[Series 1: dg lumbar spine complete 4 +v · 0.14mm/px · 5 of 5 slices shown]
[im 1/5]
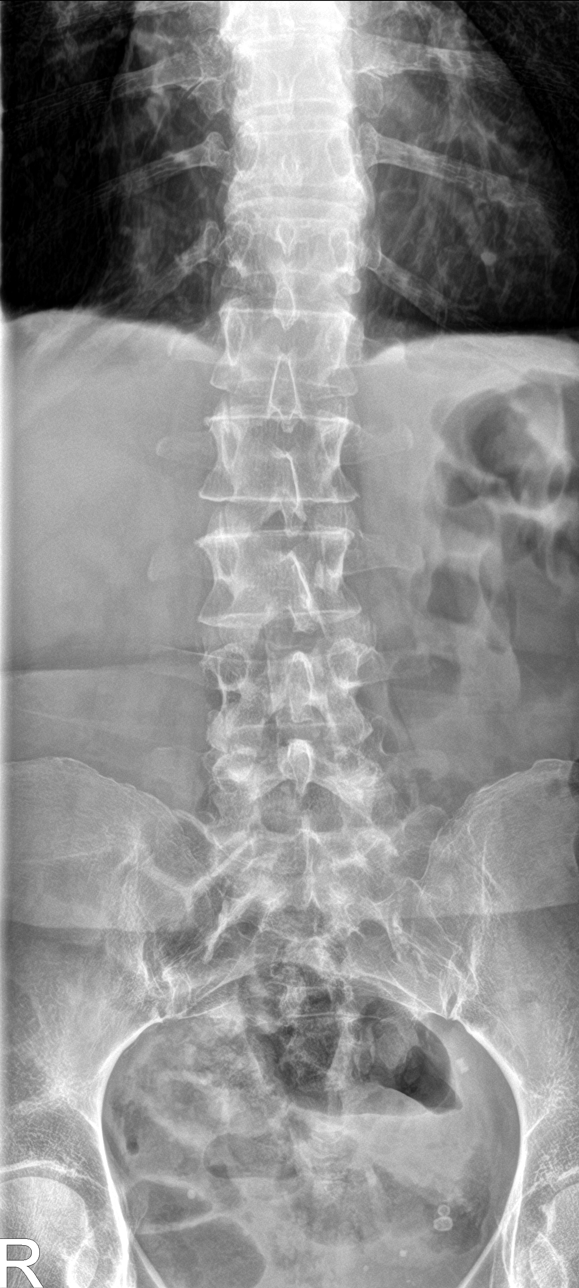
[im 2/5]
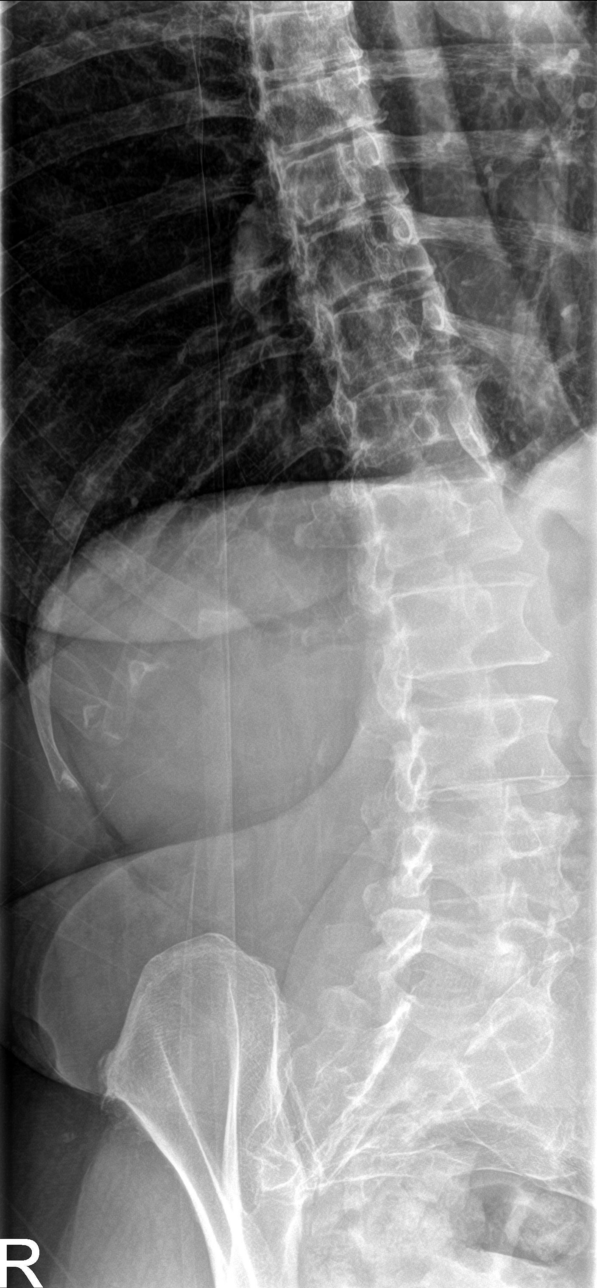
[im 3/5]
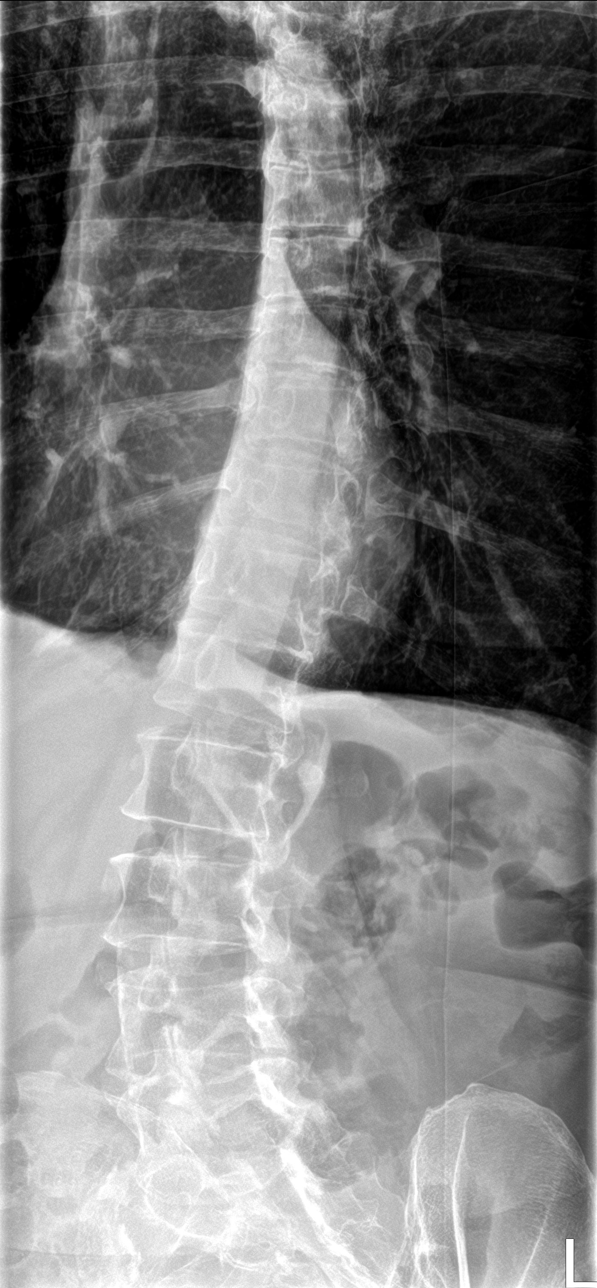
[im 4/5]
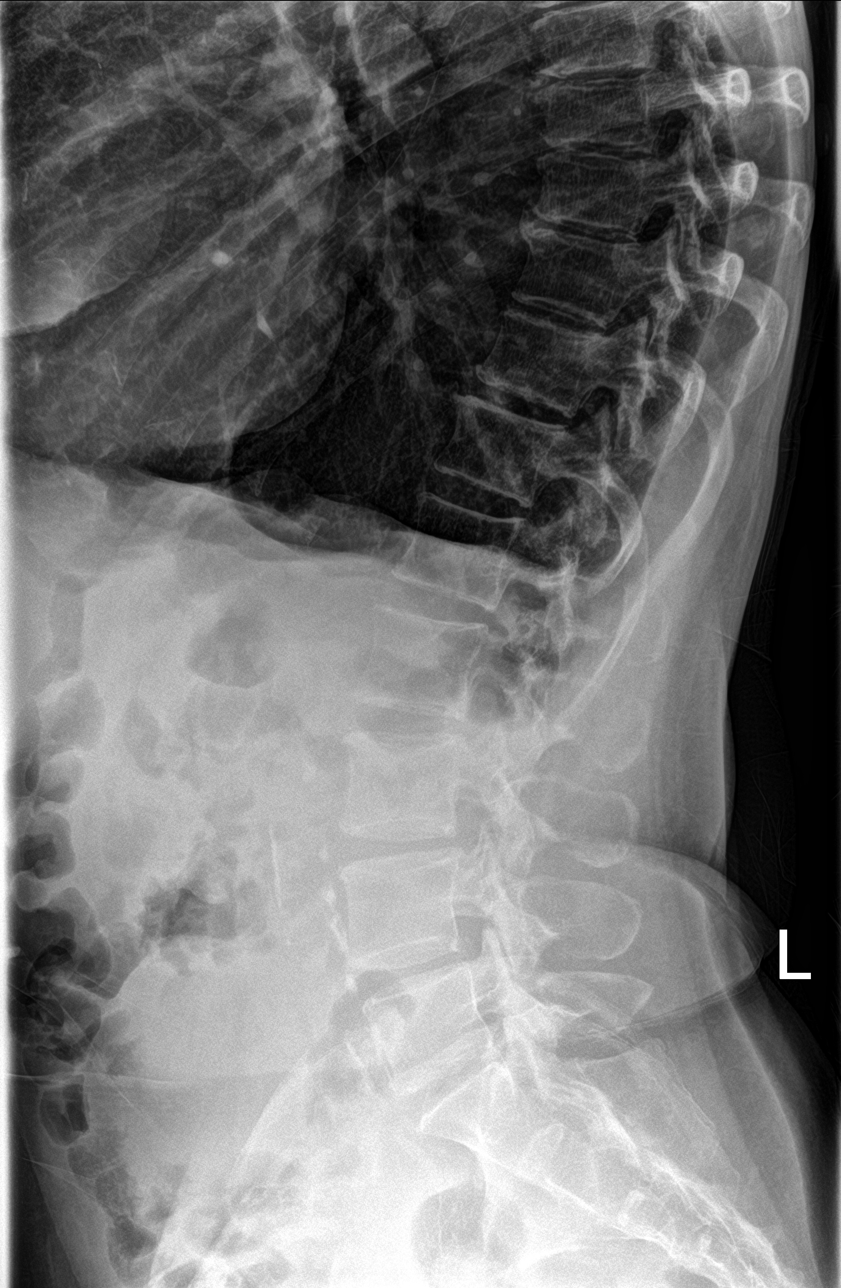
[im 5/5]
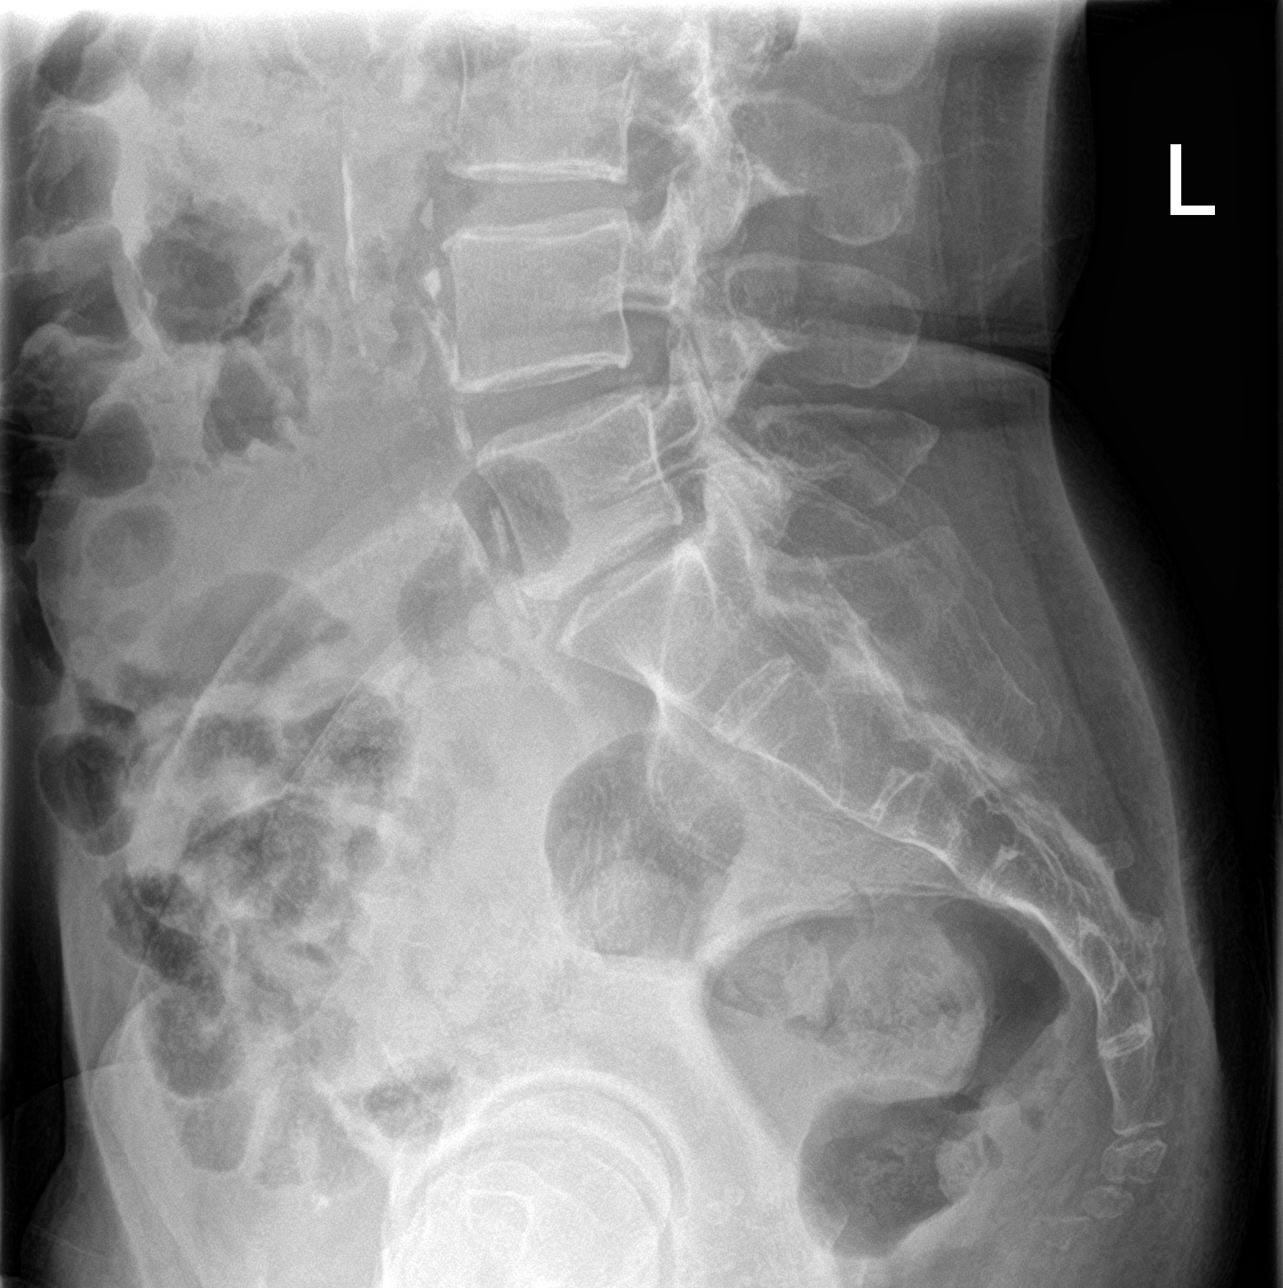

[5 of 5 positions shown; findings below may reference images not displayed]

FINDINGS: There is no evidence of lumbar spine fracture. Alignment is normal.
Intervertebral disc spaces are maintained. Slight rightward
curvature is again noted. Atherosclerotic calcifications are present
in the aorta without aneurysm.
IMPRESSION: 1. No acute or focal abnormality of the lumbar spine.
2. Atherosclerosis.

## 2022-04-04 IMAGING — CT CT NECK W/O CM
4 of 5 series · 14 of 33 positions shown, 16 images · non-contrast
Comparison: None Available.

CLINICAL DATA: Neck pain.

EXAM:
CT NECK WITHOUT CONTRAST
TECHNIQUE: Multidetector CT imaging of the neck was performed following the
standard protocol without intravenous contrast.
RADIATION DOSE REDUCTION: This exam was performed according to the
departmental dose-optimization program which includes automated
exposure control, adjustment of the mA and/or kV according to
patient size and/or use of iterative reconstruction technique.

[Series 4: axial bone · axial · 0.54mm/px · z∈[+208,+316]mm · 3 of 110 slices shown, 4 images]
[im 28/110  soft-tissue]
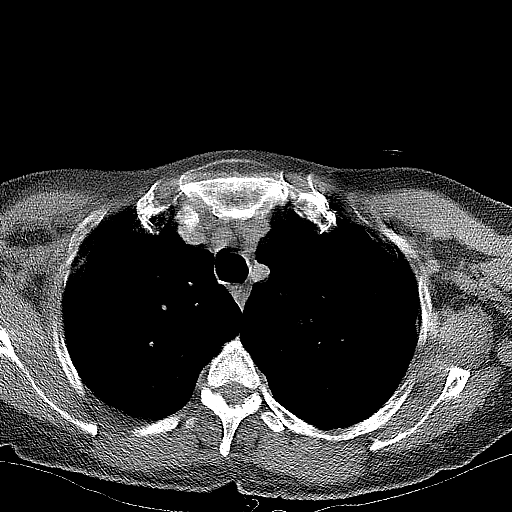
[im 28/110  bone]
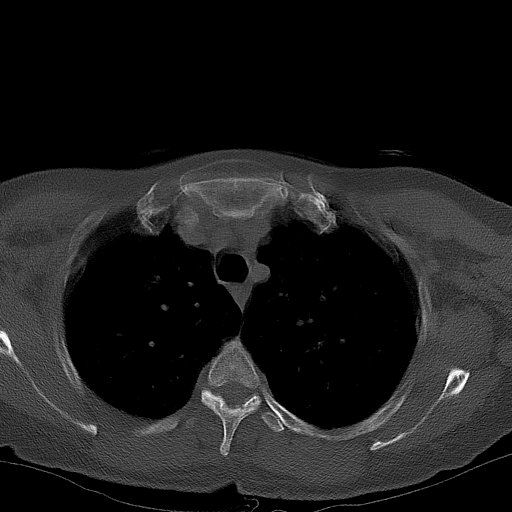
[im 55/110  bone]
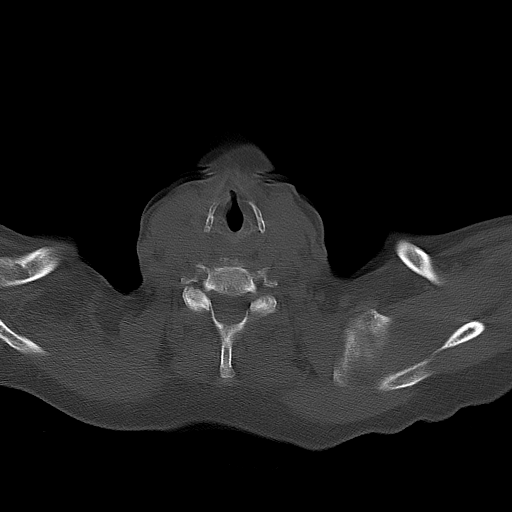
[im 82/110  bone]
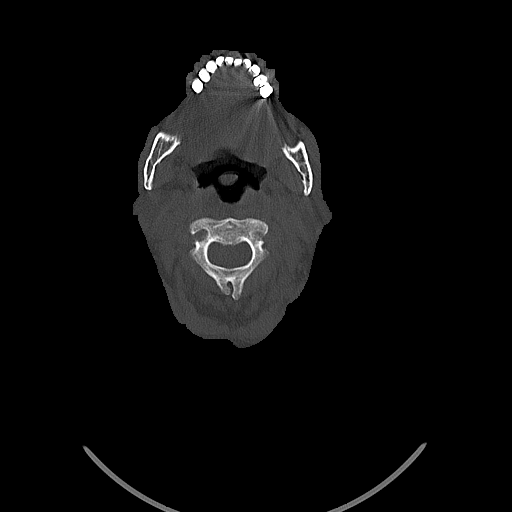

[Series 5: sag neck · sagittal · 0.43mm/px · 5 of 70 slices shown, 6 images]
[im 24/70  bone]
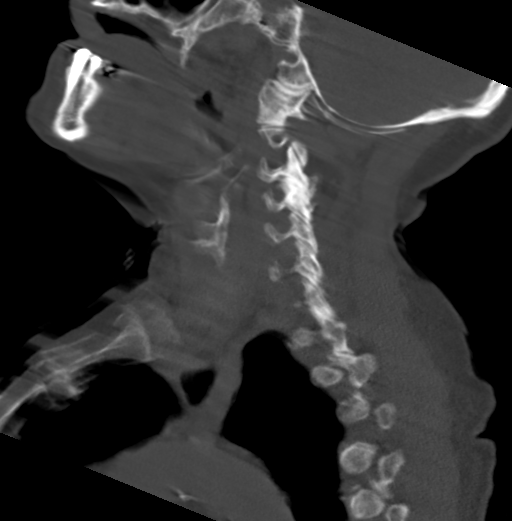
[im 29/70  bone]
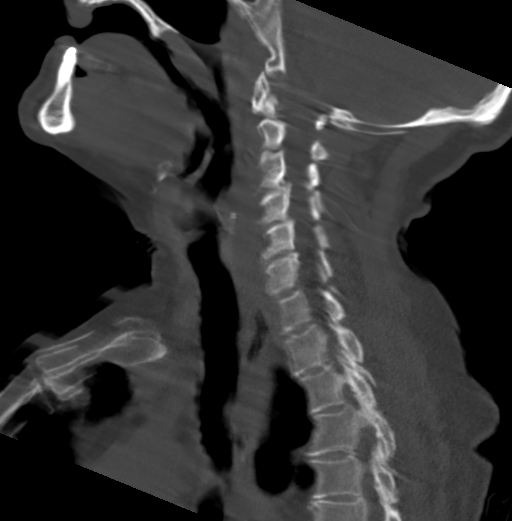
[im 35/70  soft-tissue]
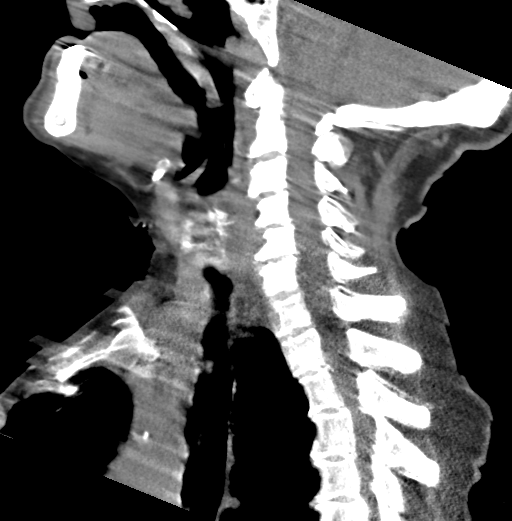
[im 35/70  bone]
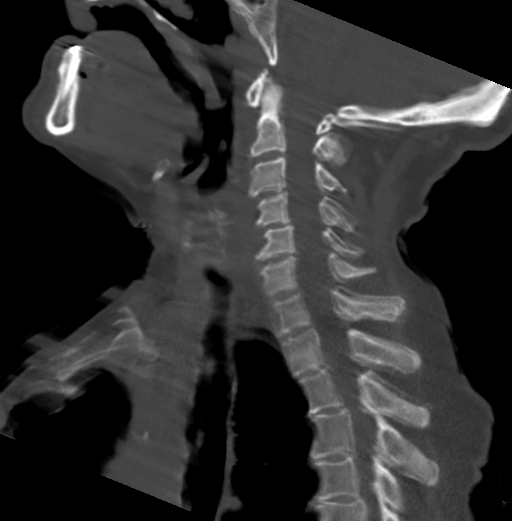
[im 41/70  bone]
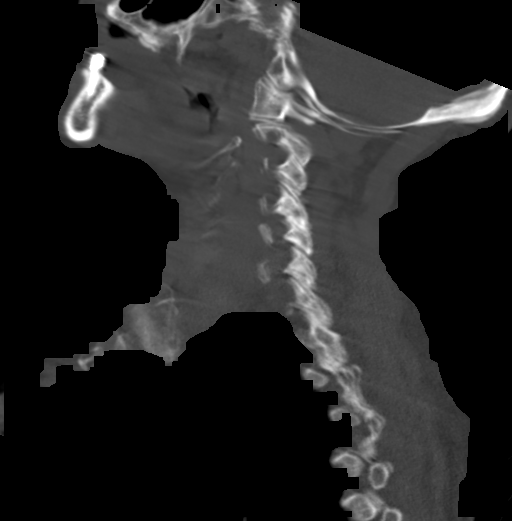
[im 47/70  bone]
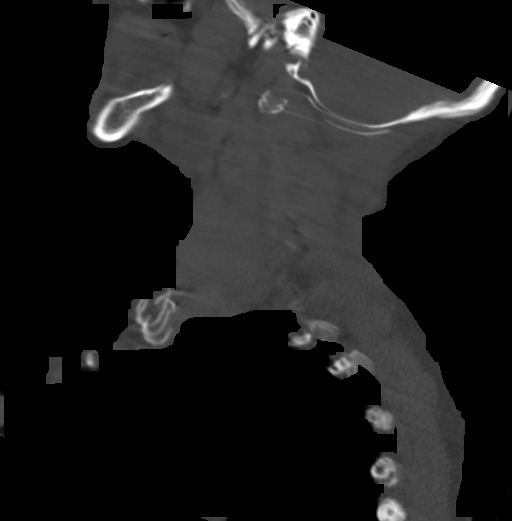

[Series 6: cor neck · coronal · 0.33mm/px · 3 of 108 slices shown]
[im 35/108  bone]
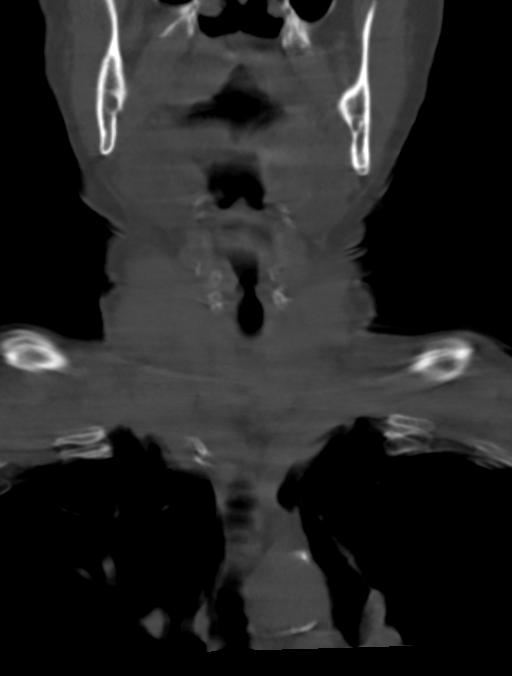
[im 48/108  bone]
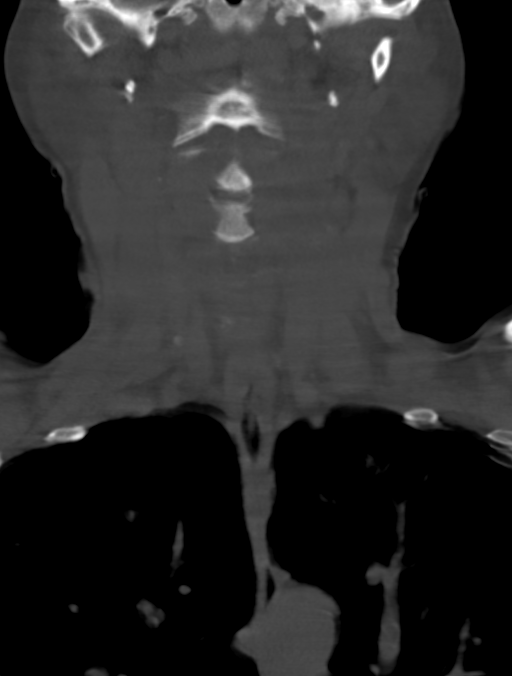
[im 61/108  bone]
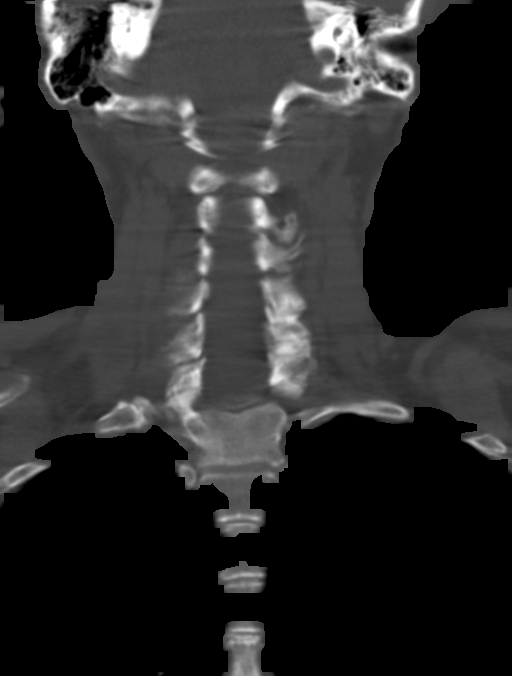

[Series 7: orthogonal (person_name) · axial · 0.36mm/px · z∈[+172,+278]mm · 3 of 112 slices shown]
[im 28/112  bone]
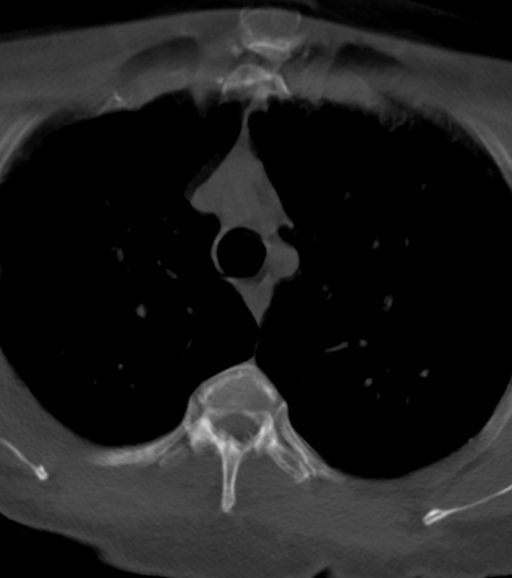
[im 56/112  bone]
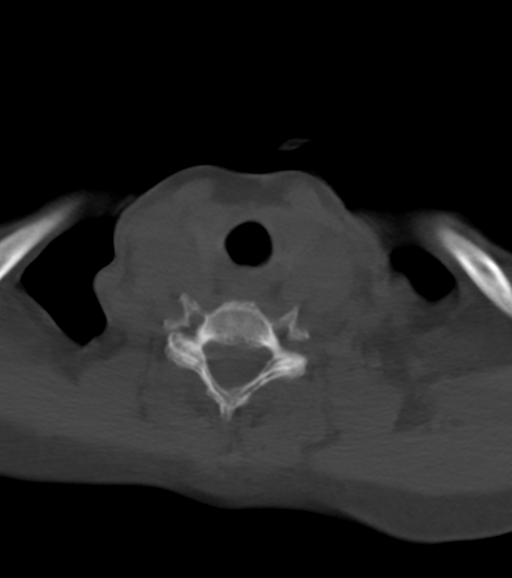
[im 84/112  bone]
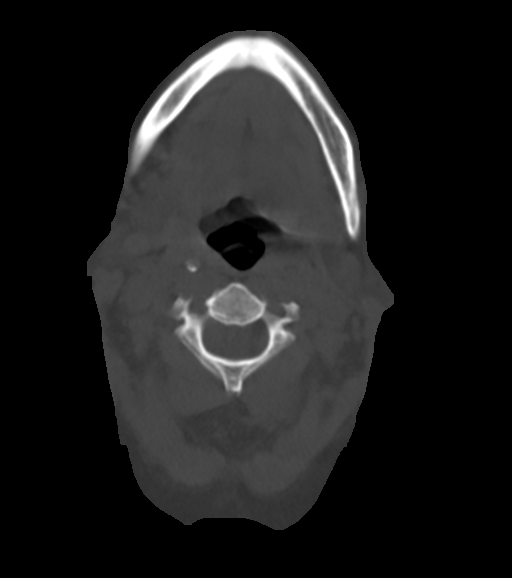

[14 of 33 positions shown; findings below may reference images not displayed]

FINDINGS: Pharynx and larynx: No focal mucosal or submucosal lesions are
present. The airway is patent. Nasopharynx is within normal limits.
Soft palate and tongue base are within normal limits. Vallecula and
epiglottis are within normal limits. Aryepiglottic folds and
piriform sinuses are clear. Vocal cords are midline and symmetric.
Trachea is clear.

Salivary glands: Submandibular and parotid glands are within normal
limits.

Thyroid: Normal

Lymph nodes: No significant cervical adenopathy is present.

Vascular: Atherosclerotic calcifications are present at the carotid
bifurcations bilaterally without definite stenosis.

Limited intracranial: Within normal limits. Limited by patient
motion.

Mastoids and visualized paranasal sinuses: The paranasal sinuses and
mastoid air cells are clear.

Skeleton: Multilevel degenerative changes are present. Bone detail
limited by patient motion.

Upper chest: Centrilobular emphysematous changes are present. Lung
apices are clear. Thoracic inlet is within normal limits.
IMPRESSION: 1. No acute or significant lesion in the neck to explain the
patient's symptoms. The airway is widely patent.
2. Multilevel degenerative changes in the cervical spine.
3. Emphysema ([3E]-[3E]).
4. The study is somewhat degraded by patient motion, decreasing
sensitivity for small lesions.

## 2022-04-04 IMAGING — CR DG THORACIC SPINE 2V
1 series · 3 of 3 positions shown · non-contrast
Comparison: CT a chest [DATE]. Two-view chest x-ray [DATE].

CLINICAL DATA: Progressive back pain

EXAM:
THORACIC SPINE 2 VIEWS

[Series 1: dg thoracic spine 2 view · 0.14mm/px · 3 of 3 slices shown]
[im 1/3]
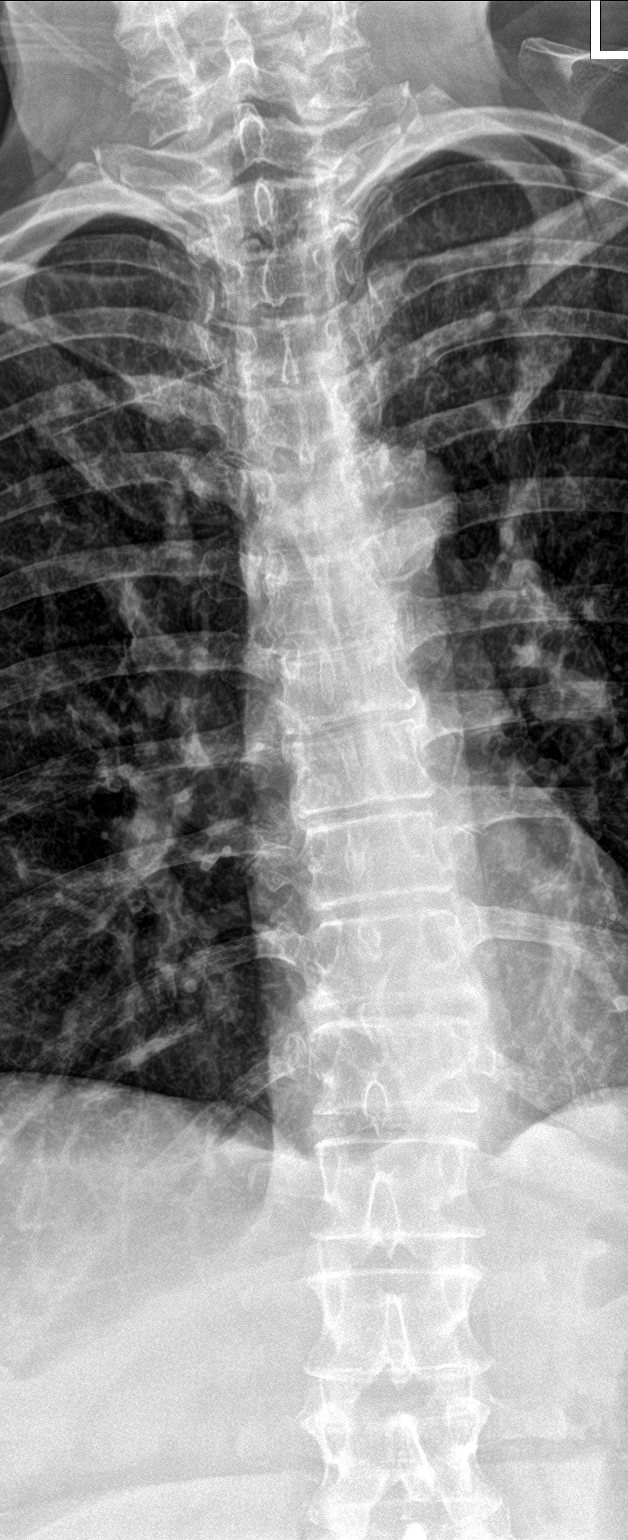
[im 2/3]
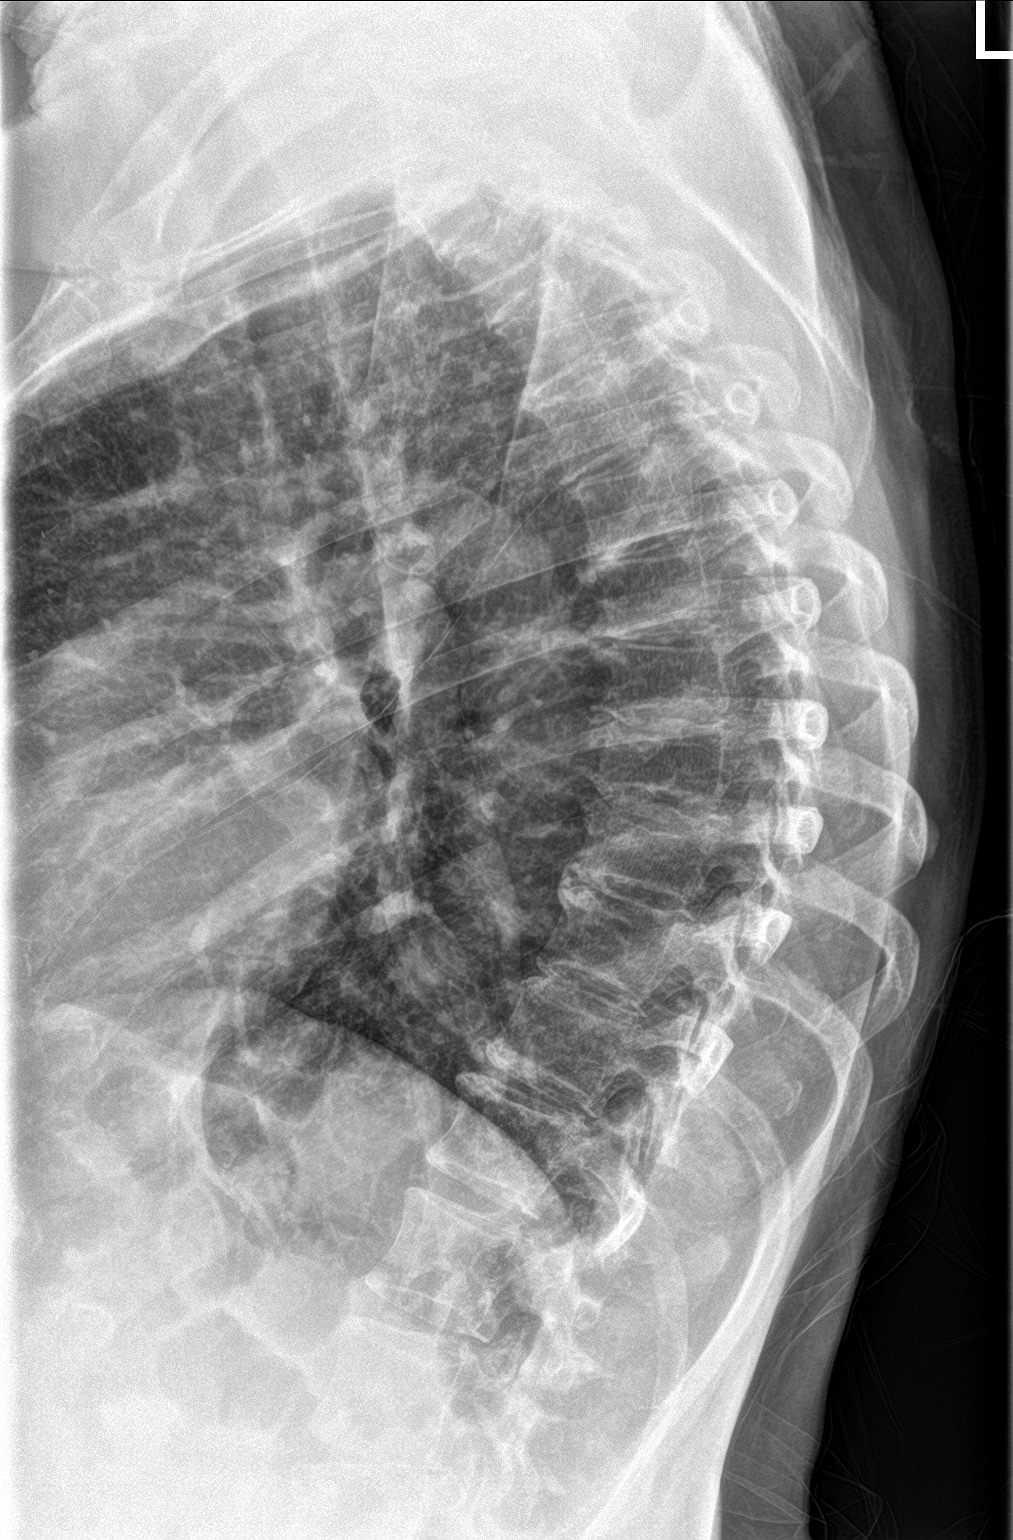
[im 3/3]
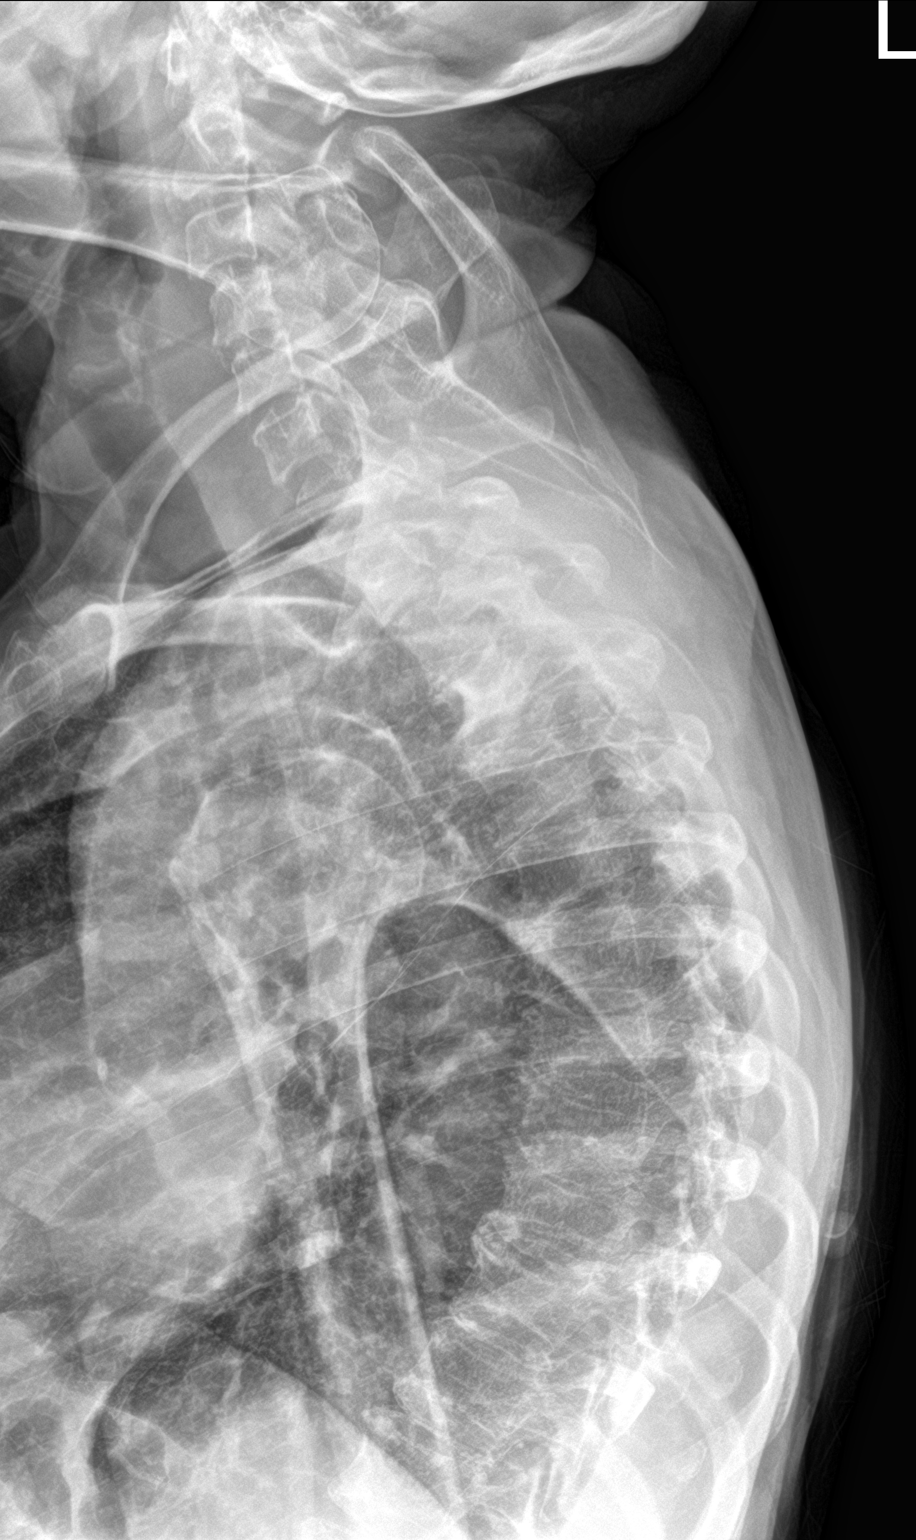

[3 of 3 positions shown; findings below may reference images not displayed]

FINDINGS: Multilevel degenerative changes are present in the thoracic spine
with exaggerated thoracic kyphosis. Vertebral body heights are
maintained. No acute abnormalities are present. No significant
listhesis is present. Mild leftward curvature is present in the
lower thoracic spine.
IMPRESSION: 1. Multilevel degenerative changes in the thoracic spine are stable.
2. No acute fracture.

## 2022-04-04 MED ORDER — PREDNISONE 20 MG PO TABS: 40.0000 mg | ORAL_TABLET | Freq: Once | ORAL | Status: DC

## 2022-04-04 MED ORDER — MONTELUKAST SODIUM 10 MG PO TABS: 10.0000 mg | ORAL_TABLET | Freq: Every day | ORAL | Status: DC

## 2022-04-04 MED ORDER — CYCLOBENZAPRINE HCL 10 MG PO TABS: 10.0000 mg | ORAL_TABLET | Freq: Three times a day (TID) | ORAL | Status: DC | PRN

## 2022-04-04 MED ORDER — CITALOPRAM HYDROBROMIDE 20 MG PO TABS
10.0000 mg | ORAL_TABLET | Freq: Every day | ORAL | Status: DC
Start: 2022-04-04 — End: 2022-04-04

## 2022-04-04 MED ORDER — FLUTICASONE PROPIONATE 50 MCG/ACT NA SUSP: 2.0000 | Freq: Every day | NASAL | Status: DC | PRN

## 2022-04-04 MED ORDER — UMECLIDINIUM BROMIDE 62.5 MCG/ACT IN AEPB
1.0000 | INHALATION_SPRAY | Freq: Every day | RESPIRATORY_TRACT | Status: DC
  Filled 2022-04-04: qty 7

## 2022-04-04 MED ORDER — ALBUTEROL SULFATE HFA 108 (90 BASE) MCG/ACT IN AERS: 1.0000 | INHALATION_SPRAY | RESPIRATORY_TRACT | Status: DC | PRN

## 2022-04-04 MED ORDER — VITAMIN D 25 MCG (1000 UNIT) PO TABS: 1000.0000 [IU] | ORAL_TABLET | Freq: Every day | ORAL | Status: DC

## 2022-04-04 MED ORDER — CYANOCOBALAMIN 500 MCG PO TABS: 500.0000 ug | ORAL_TABLET | Freq: Every day | ORAL | Status: DC

## 2022-04-04 MED ORDER — FLUTICASONE FUROATE-VILANTEROL 100-25 MCG/ACT IN AEPB
1.0000 | INHALATION_SPRAY | Freq: Every day | RESPIRATORY_TRACT | Status: DC
  Filled 2022-04-04: qty 28

## 2022-04-04 MED ORDER — LIDOCAINE 5 % EX PTCH
1.0000 | MEDICATED_PATCH | CUTANEOUS | Status: DC
  Administered 2022-04-04: 1 via TRANSDERMAL
  Filled 2022-04-04: qty 1

## 2022-04-04 MED ORDER — KETOROLAC TROMETHAMINE 15 MG/ML IJ SOLN
15.0000 mg | Freq: Once | INTRAMUSCULAR | Status: AC
  Administered 2022-04-04: 15 mg via INTRAVENOUS
  Filled 2022-04-04: qty 1

## 2022-04-04 MED ORDER — IPRATROPIUM BROMIDE 0.02 % IN SOLN: 0.2500 mg | Freq: Four times a day (QID) | RESPIRATORY_TRACT | Status: DC

## 2022-04-04 MED ORDER — QUETIAPINE FUMARATE 200 MG PO TABS: 200.0000 mg | ORAL_TABLET | Freq: Every day | ORAL | Status: DC

## 2022-04-04 MED ORDER — DIAZEPAM 5 MG/ML IJ SOLN: 2.5000 mg | Freq: Once | INTRAMUSCULAR | Status: DC

## 2022-04-04 NOTE — BH Assessment (Signed)
Comprehensive Clinical Assessment (CCA) Screening, Triage and Referral Note  04/04/2022 Elaynah [place] [id number]  Chief Complaint:  Chief Complaint  Patient presents with   Suicidal   Neck Pain   Visit Diagnosis: Depression  Patient Reported Information How did you hear about [place]? No data recorded What Is the Reason for Your Visit/Call Today? Patient has had ongoing passive SI and reported while in the ER for her pain.  How Long Has This Been Causing You Problems? > than 6 months  What Do You Feel Would Help You the Most Today? Treatment for Depression or other mood problem   Have You Recently Had Any Thoughts About Hurting Yourself? No  Are You Planning to Commit Suicide/Harm Yourself At This time? No   Have you Recently Had Thoughts About [place]? No  Are You Planning to Harm Someone at This Time? No  Explanation: No data recorded  Have You Used Any Alcohol or Drugs in the Past 24 Hours? No  How Long Ago Did You Use Drugs or Alcohol? No data recorded What Did You Use and How Much? No data recorded  Do You Currently Have a Therapist/Psychiatrist? No  Name of Therapist/Psychiatrist: No data recorded  Have You Been Recently Discharged From Any Office Practice or Programs? No  Explanation of Discharge From Practice/Program: No data recorded   CCA Screening Triage Referral Assessment Type of Contact: Face-to-Face  Telemedicine Service Delivery:   Is this Initial or Reassessment? No data recorded Date Telepsych consult ordered in [facility]:  No data recorded Time Telepsych consult ordered in [facility]:  No data recorded Location of Assessment: [facility] ED  Provider Location: [facility] ED   Collateral Involvement: No data recorded  Does Patient Have a [place]? No data recorded Name and Contact of Legal Guardian: No data recorded If Minor and Not Living with Parent(s), Who has Custody? No data recorded Is CPS involved or ever been involved?  Never  Is APS involved or ever been involved? Never   Patient Determined To Be At Risk for Harm To Self or Others Based on Review of Patient Reported Information or Presenting Complaint? No  Method: No data recorded Availability of Means: No data recorded Intent: No data recorded Notification Required: No data recorded Additional Information for Danger to Others Potential: No data recorded Additional Comments for Danger to Others Potential: No data recorded Are There Guns or Other Weapons in Your Home? No data recorded Types of Guns/Weapons: No data recorded Are These Weapons Safely Secured?                            No data recorded Who Could Verify You Are Able To Have These Secured: No data recorded Do You Have any Outstanding Charges, Pending Court Dates, Parole/Probation? No data recorded Contacted To Inform of Risk of Harm To Self or Others: No data recorded  Does Patient Present under Involuntary Commitment? No  IVC Papers Initial File Date: No data recorded  [place] of Residence: [place]   Patient Currently Receiving the Following Services: No data recorded  Determination of Need: Emergent (2 hours)   Options For Referral: No data recorded  Discharge Disposition:     Gunnar Fusi MS, LCAS, [facility], [facility] Therapeutic Triage Specialist 04/04/2022 5:54 PM=

## 2022-04-04 NOTE — ED Provider Notes (Addendum)
[facility] Provider Note    Event Date/Time   First MD Initiated Contact with Patient 04/04/22 1030     (approximate)   History   Suicidal and Neck Pain   HPI  Colleen [name] is a 61 y.o. female who comes in saying she is going to kill her self if her back pain does not get better she says she is having severe pain in her neck all the way down to her lumbar spine.  Is much worse than usual.  She says her whole body feels cold.      Physical Exam   Triage Vital Signs: ED Triage Vitals  Enc Vitals Group     BP 04/04/22 1015 (!) 138/101     Pulse Rate 04/04/22 1015 (!) 116     Resp 04/04/22 1015 (!) 22     Temp 04/04/22 1015 98.2 F (36.8 C)     Temp Source 04/04/22 1015 Oral     SpO2 04/04/22 1014 95 %     Weight 04/04/22 1015 124 lb (56.2 kg)     Height 04/04/22 1015 5' (1.524 m)     Head Circumference --      Peak Flow --      Pain Score 04/04/22 1015 10     Pain Loc --      Pain Edu? --      Excl. in [place]? --     Most recent vital signs: Vitals:   04/04/22 1014 04/04/22 1015  BP:  (!) 138/101  Pulse:  (!) 116  Resp:  (!) 22  Temp:  98.2 F (36.8 C)  SpO2: 95% 95%     General: Awake, somewhat anxious complaining of severe pain Head normocephalic atraumatic Neck appears supple does not appear to be tender on palpation Upper back not tender on palpation Lower back is tender over about L5-S1 area otherwise nontender CV:  Good peripheral perfusion.  Heart regular rate and rhythm no audible murmurs Resp:  Normal effort.  Lungs occasional scattered wheezes Abd:  No distention.  Soft and nontender Patient moving extremities equally and well.  DTRs are 1+ in both knees and ankles.  Patient does not report any numbness.   ED Results / Procedures / Treatments   Labs (all labs ordered are listed, but only abnormal results are displayed) Labs Reviewed  COMPREHENSIVE METABOLIC PANEL - Abnormal; Notable for the following components:       Result Value   Sodium 133 (*)    Chloride 93 (*)    Glucose, Bld 108 (*)    BUN 7 (*)    Creatinine, Ser 0.40 (*)    All other components within normal limits  SALICYLATE LEVEL - Abnormal; Notable for the following components:   Salicylate Lvl <1.0 (*)    All other components within normal limits  ACETAMINOPHEN LEVEL - Abnormal; Notable for the following components:   Acetaminophen (Tylenol), Serum <10 (*)    All other components within normal limits  URINE DRUG SCREEN, QUALITATIVE ([facility] ONLY) - Abnormal; Notable for the following components:   Benzodiazepine, Ur Scrn POSITIVE (*)    All other components within normal limits  ETHANOL  CBC     EKG     RADIOLOGY    PROCEDURES:  Critical Care performed:   Procedures   MEDICATIONS ORDERED IN ED: Medications  lidocaine (LIDODERM) 5 % 1 patch (1 patch Transdermal Patch Applied 04/04/22 1501)  albuterol (VENTOLIN HFA) 108 (90 Base) MCG/ACT inhaler 1-2  puff (has no administration in time range)  cholecalciferol (VITAMIN D3) tablet 1,000 Units (has no administration in time range)  citalopram (CELEXA) tablet 10 mg (has no administration in time range)  cyclobenzaprine (FLEXERIL) tablet 10 mg (has no administration in time range)  fluticasone (FLONASE) 50 MCG/ACT nasal spray 2 spray (has no administration in time range)  fluticasone furoate-vilanterol (BREO ELLIPTA) 100-25 MCG/ACT 1 puff (has no administration in time range)    And  umeclidinium bromide (INCRUSE ELLIPTA) 62.5 MCG/ACT 1 puff (has no administration in time range)  ipratropium (ATROVENT) nebulizer solution 0.25 mg (has no administration in time range)  montelukast (SINGULAIR) tablet 10 mg (has no administration in time range)  predniSONE (DELTASONE) tablet 40 mg (has no administration in time range)  QUEtiapine (SEROQUEL) tablet 200 mg (has no administration in time range)  vitamin B-12 (CYANOCOBALAMIN) tablet 500 mcg (has no administration in time range)   ketorolac (TORADOL) 15 MG/ML injection 15 mg (15 mg Intravenous Given 04/04/22 1501)     IMPRESSION / MDM / ASSESSMENT AND PLAN / ED COURSE  I reviewed the triage vital signs and the nursing notes. Patient with chronic back pain suicidal ideation because of the pain.    Patient's presentation is most consistent with exacerbation of chronic illness.  Patient gets up and goes to the bathroom several times without any apparent change in her usual abilities per her caregiver.   I will sign the patient out to oncoming provider pending CT of the neck and psych  FINAL CLINICAL IMPRESSION(S) / ED DIAGNOSES   Final diagnoses:  Suicidal ideations  Other back pain, unspecified chronicity     Rx / DC Orders   ED Discharge Orders     None        Note:  This document was prepared using Dragon voice recognition software and may include unintentional dictation errors.   Nena Polio, MD 04/04/22 1604    Nena Polio, MD 04/04/22 [phone]

## 2022-04-04 NOTE — ED Notes (Signed)
Pt's dinner has arrived. Writer provided it to pt

## 2022-04-04 NOTE — ED Notes (Signed)
VOL CONSULT  DONE  PENDING  D/C

## 2022-04-04 NOTE — ED Triage Notes (Signed)
Patient c/o neck pain that radiates down her spine. Patient states "I cannot take it anymore, I just want to die." Patient reports SI and that she plans to "slice her wrists." Patient wears 3L O2 continuously but reports she is not here for breathing difficulties. Patient was seen on 04/01/22 for breathing difficulties.

## 2022-04-04 NOTE — ED Provider Notes (Signed)
CT neck   IMPRESSION: 1. No acute or significant lesion in the neck to explain the patient's symptoms. The airway is widely patent. 2. Multilevel degenerative changes in the cervical spine. 3. Emphysema (ICD10-J43.9). 4. The study is somewhat degraded by patient motion, decreasing sensitivity for small lesions.  I have conversed with Waylan Boga of psychiatry, they advised patient may be discharged to follow-up with outpatient psychiatry.  ----------------------------------------- 5:48 PM on 04/04/2022 ----------------------------------------- Vitals:   04/04/22 1015 04/04/22 1622  BP: (!) 138/101 131/88  Pulse: (!) 116 95  Resp: (!) 22 16  Temp: 98.2 F (36.8 C) 97.8 F (36.6 C)  SpO2: 95% 97%     Patient resting without distress.  Unlabored respirations.  Discussed with her plan of care, she is understanding agreeable plan for discharge and outpatient follow-up. Return precautions and treatment recommendations and follow-up discussed with the patient who is agreeable with the plan.  Pain appears well managed at this time, patient is alert fully oriented.  I took care of her recently and admitted her with a COPD exacerbation, her work of breathing at this time appears much improved from when I last saw her as well.     Delman Kitten, MD 04/04/22 [phone]

## 2022-04-04 NOTE — ED Notes (Signed)
Pt ambulatory to restroom. Gait unsteady. Assistance provided by ED tech.

## 2022-04-04 NOTE — Consult Note (Signed)
[facility] Face-to-Face Psychiatry Consult   Reason for Consult:  anxiety, stress, panic Referring Physician:  EDP Patient Identification: Colleen [name] MRN:  [id number] Principal Diagnosis:  Neck issues Diagnosis:  Major depressive disorder, moderate   Total Time spent with patient: 45 minutes  Subjective:   Colleen [name] is a 61 y.o. female patient admitted with neck/back pain.  Client reports, "I could be doing better."  Her depression is currently moderate, fluctuates with pain.  No suicidal ideations "right now", it is not uncommon for her to have intermittent, passive suicidal ideations.  Moderate anxiety with panic attacks at times.  No psychosis, paranoia, homicidal ideations, substance use.  She has a therapist and psychiatrist, Dr Shea Evans.  Poor sleep and appetite recently because of pain issues.  She feels safe to return home once she is medically cleared, psych cleared.  HPI on assessment last weekend:  61 yo female presented with SOB related to a COPD exacerbation, consult for anxiety.  She rates her depression as moderate related to stress with her husband.  She perseverates throughout the assessment about her being upset that he is taking over things and enjoying it.  For instance, he now does all of the grocery shopping and "finds pleasure in it."  She is frustrated when she asks him to do things like "give the dog fresh water that he likes" and he doesn't.  Reports having chronic, passive, intermittent suicidal ideations at times when frustrated with him.  "None right now".  No homicidal ideations or hallucinations or substance abuse.  She does have a therapist and a psychiatrist who she has an appointment with tomorrow via phone.  Encouraged her to use her coping skills that her therapist is working with her on reducing her frustration with her husband.  He psychiatrist can make changes tomorrow based on her findings.  She does not warrant psych admission.  Past Psychiatric History:  depression, anxiety, PTSD  Risk to Self:  none Risk to Others:  none Prior Inpatient Therapy:  gero in February Prior Outpatient Therapy: Dr Shea Evans   Past Medical History:  Past Medical History:  Diagnosis Date   Anxiety    Asthma    Breast cancer ([place]) 2004   left breast   Chronic back pain    COPD (chronic obstructive pulmonary disease) ([place])    Depression    History of kidney surgery    Personal history of radiation therapy    PTSD (post-traumatic stress disorder)     Past Surgical History:  Procedure Laterality Date   BREAST BIOPSY Left 2004   positive   BREAST BIOPSY Right    neg   BREAST LUMPECTOMY Left 2004   positive   LEFT HEART CATH AND CORONARY ANGIOGRAPHY N/A 11/06/2021   Procedure: LEFT HEART CATH AND CORONARY ANGIOGRAPHY;  Surgeon: Corey Skains, MD;  Location: [facility];  Service: Cardiovascular;  Laterality: N/A;   MASTECTOMY Left    MASTECTOMY     NEPHRECTOMY Left    TUBAL LIGATION     Family History:  Family History  Problem Relation Age of Onset   Breast cancer Cousin    Breast cancer Cousin    Bipolar disorder Daughter    Drug abuse Daughter    Alcohol abuse Maternal Grandfather    Family Psychiatric  History: see above Social History:  Social History   Substance and Sexual Activity  Alcohol Use Never     Social History   Substance and Sexual Activity  Drug Use  Never   Comment: prescribed muscle relaxers and valium    Social History   Socioeconomic History   Marital status: Married    Spouse name: Not on file   Number of children: 2   Years of education: GED   Highest education level: Not on file  Occupational History   Not on file  Tobacco Use   Smoking status: Every Day    Packs/day: 0.50    Types: Cigarettes    Passive exposure: Never   Smokeless tobacco: Never  Vaping Use   Vaping Use: Never used  Substance and Sexual Activity   Alcohol use: Never   Drug use: Never    Comment: prescribed muscle  relaxers and valium   Sexual activity: Not Currently  Other Topics Concern   Not on file  Social History Narrative   Not on file   Social Determinants of Health   Financial Resource Strain: Not on file  Food Insecurity: Not on file  Transportation Needs: Not on file  Physical Activity: Not on file  Stress: Not on file  Social Connections: Not on file   Additional Social History:    Allergies:   Allergies  Allergen Reactions   Desvenlafaxine Anaphylaxis   Morphine And Related Anaphylaxis   Penicillins Anaphylaxis    Has patient had a PCN reaction causing immediate rash, facial/tongue/throat swelling, SOB or lightheadedness with hypotension: Yes Has patient had a PCN reaction causing severe rash involving mucus membranes or skin necrosis: No Has patient had a PCN reaction that required hospitalization: Unknown Has patient had a PCN reaction occurring within the last 10 years: No If all of the above answers are "NO", then may proceed with Cephalosporin use.    Prazosin Other (See Comments)   Tamoxifen Anaphylaxis   Trazodone Anaphylaxis   Clonazepam    Codeine Swelling   Duloxetine Itching   Gabapentin Hives    Rash and swelling.    Hydroxyzine Itching   Lorazepam     Patient feels this medications makes her to sedated and wants to avoid   Paroxetine Hcl Hives   Sulfa Antibiotics Itching   Cephalexin Rash    Labs:  Results for orders placed or performed during the hospital encounter of 04/04/22 (from the past 48 hour(s))  Comprehensive metabolic panel     Status: Abnormal   Collection Time: 04/04/22 10:16 AM  Result Value Ref Range   Sodium 133 (L) 135 - 145 mmol/L   Potassium 4.5 3.5 - 5.1 mmol/L   Chloride 93 (L) 98 - 111 mmol/L   CO2 28 22 - 32 mmol/L   Glucose, Bld 108 (H) 70 - 99 mg/dL    Comment: Glucose reference range applies only to samples taken after fasting for at least 8 hours.   BUN 7 (L) 8 - 23 mg/dL   Creatinine, Ser 0.40 (L) 0.44 - 1.00 mg/dL    Calcium 9.2 8.9 - 10.3 mg/dL   Total Protein 7.6 6.5 - 8.1 g/dL   Albumin 4.3 3.5 - 5.0 g/dL   AST 18 15 - 41 U/L   ALT 18 0 - 44 U/L   Alkaline Phosphatase 38 38 - 126 U/L   Total Bilirubin 0.8 0.3 - 1.2 mg/dL   GFR, Estimated >60 >60 mL/min    Comment: (NOTE) Calculated using the CKD-EPI Creatinine Equation (2021)    Anion gap 12 5 - 15    Comment: Performed at [facility], [address], [place], [place] [address]  Ethanol  Status: None   Collection Time: 04/04/22 10:16 AM  Result Value Ref Range   Alcohol, Ethyl (B) <10 <10 mg/dL    Comment: (NOTE) Lowest detectable limit for serum alcohol is 10 mg/dL.  For medical purposes only. Performed at [facility], [address] [place], [place] [address]   Salicylate level     Status: Abnormal   Collection Time: 04/04/22 10:16 AM  Result Value Ref Range   Salicylate Lvl <1.6 (L) 7.0 - 30.0 mg/dL    Comment: Performed at [facility], [place], [place] [address]  Acetaminophen level     Status: Abnormal   Collection Time: 04/04/22 10:16 AM  Result Value Ref Range   Acetaminophen (Tylenol), Serum <10 (L) 10 - 30 ug/mL    Comment: (NOTE) Therapeutic concentrations vary significantly. A range of 10-30 ug/mL  may be an effective concentration for many patients. However, some  are best treated at concentrations outside of this range. Acetaminophen concentrations >150 ug/mL at 4 hours after ingestion  and >50 ug/mL at 12 hours after ingestion are often associated with  toxic reactions.  Performed at [facility], [address] [place], [place] [address]   cbc     Status: None   Collection Time: 04/04/22 10:16 AM  Result Value Ref Range   WBC 9.5 4.0 - 10.5 K/uL   RBC 4.75 3.87 - 5.11 MIL/uL   Hemoglobin 14.4 12.0 - 15.0 g/dL   HCT 44.0 36.0 - 46.0 %   MCV 92.6 80.0 - 100.0 fL   MCH 30.3 26.0 - 34.0 pg   MCHC 32.7 30.0 - 36.0 g/dL   RDW 13.5 11.5 - 15.5 %    Platelets 341 150 - 400 K/uL   nRBC 0.0 0.0 - 0.2 %    Comment: Performed at [facility], [address] [place], [place] [address]    Current Facility-Administered Medications  Medication Dose Route Frequency Provider Last Rate Last Admin   diazepam (VALIUM) injection 2.5 mg  2.5 mg Intravenous Once Nena Polio, MD       ketorolac (TORADOL) 15 MG/ML injection 15 mg  15 mg Intravenous Once Nena Polio, MD       Current Outpatient Medications  Medication Sig Dispense Refill   albuterol (VENTOLIN HFA) 108 (90 Base) MCG/ACT inhaler Inhale 1-2 puffs into the lungs every 4 (four) hours as needed for cough or wheezing.     cholecalciferol (VITAMIN D3) 25 MCG (1000 UNIT) tablet Take 1,000 Units by mouth daily.     citalopram (CELEXA) 10 MG tablet Take 1 tablet (10 mg total) by mouth daily. 90 tablet 0   cyclobenzaprine (FLEXERIL) 10 MG tablet Take 1 tablet (10 mg total) by mouth 3 (three) times daily as needed for muscle spasms. 30 tablet 0   denosumab (PROLIA) 60 MG/ML SOSY injection Inject 60 mg into the skin every 6 (six) months.     diazepam (VALIUM) 2 MG tablet Take 1 tablet (2 mg total) by mouth daily as needed for anxiety. For severe panic symptoms 15 tablet 1   Fluticasone-Umeclidin-Vilant (TRELEGY ELLIPTA) 100-62.5-25 MCG/INH AEPB Inhale 1 puff into the lungs daily.     ipratropium (ATROVENT) 0.02 % nebulizer solution Take 0.25 mg by nebulization 4 (four) times daily.     montelukast (SINGULAIR) 10 MG tablet Take 10 mg by mouth daily.     predniSONE (DELTASONE) 20 MG tablet Take 2 tablets (40 mg total) by mouth daily with breakfast  for 4 days. 8 tablet 0   QUEtiapine (SEROQUEL) 200 MG tablet Take 1 tablet (200 mg total) by mouth at bedtime. 90 tablet 0   vitamin B-12 (CYANOCOBALAMIN) 500 MCG tablet Take 500 mcg by mouth daily.     EPINEPHrine 0.3 mg/0.3 mL IJ SOAJ injection Inject 0.3 mg into the muscle as needed for anaphylaxis.     fluticasone (FLONASE) 50 MCG/ACT  nasal spray Place 2 sprays into both nostrils daily. 9.9 mL 0   guaiFENesin-dextromethorphan (ROBITUSSIN DM) 100-10 MG/5ML syrup Take 10 mLs by mouth every 4 (four) hours as needed for cough. 118 mL 0    Musculoskeletal: Strength & Muscle Tone: decreased Gait & Station:  did not witness Patient leans: N/A  Psychiatric Specialty Exam: Physical Exam Vitals and nursing note reviewed.  Constitutional:      Appearance: Normal appearance.  HENT:     Head: Normocephalic.     Nose: Nose normal.  Pulmonary:     Effort: Pulmonary effort is normal.  Musculoskeletal:        General: Normal range of motion.     Cervical back: Normal range of motion.  Neurological:     General: No focal deficit present.     Mental Status: She is alert and oriented to person, place, and time.  Psychiatric:        Attention and Perception: Attention and perception normal.        Mood and Affect: Mood is anxious and depressed.        Speech: Speech normal.        Behavior: Behavior normal. Behavior is cooperative.        Thought Content: Thought content normal.        Cognition and Memory: Cognition and memory normal.        Judgment: Judgment normal.    Review of Systems  Musculoskeletal:  Positive for back pain.  Psychiatric/Behavioral:  Positive for depression. The patient is nervous/anxious.   All other systems reviewed and are negative.  Blood pressure (!) 138/101, pulse (!) 116, temperature 98.2 F (36.8 C), temperature source Oral, resp. rate (!) 22, height 5' (1.524 m), weight 56.2 kg, SpO2 95 %.Body mass index is 24.22 kg/m.  General Appearance: Casual  Eye Contact:  Good  Speech:  Normal Rate  Volume:  Normal  Mood:  Anxious and Depressed  Affect:  Congruent  Thought Process:  Coherent and Descriptions of Associations: Intact  Orientation:  Full (Time, Place, and Person)  Thought Content:  WDL and Logical  Suicidal Thoughts:  No  Homicidal Thoughts:  No  Memory:  Immediate;    Good Recent;   Good Remote;   Good  Judgement:  Good  Insight:  Good  Psychomotor Activity:  Decreased  Concentration:  Concentration: Good and Attention Span: Good  Recall:  Good  Fund of Knowledge:  Good  Language:  Good  Akathisia:  No  Handed:  Right  AIMS (if indicated):     Assets:  Housing Leisure Time Resilience Social Support  ADL's:  Intact  Cognition:  WNL  Sleep:      cla  Physical Exam: Physical Exam Vitals and nursing note reviewed.  Constitutional:      Appearance: Normal appearance.  HENT:     Head: Normocephalic.     Nose: Nose normal.  Pulmonary:     Effort: Pulmonary effort is normal.  Musculoskeletal:        General: Normal range of motion.     Cervical  back: Normal range of motion.  Neurological:     General: No focal deficit present.     Mental Status: She is alert and oriented to person, place, and time.  Psychiatric:        Attention and Perception: Attention and perception normal.        Mood and Affect: Mood is anxious and depressed.        Speech: Speech normal.        Behavior: Behavior normal. Behavior is cooperative.        Thought Content: Thought content normal.        Cognition and Memory: Cognition and memory normal.        Judgment: Judgment normal.   Review of Systems  Musculoskeletal:  Positive for back pain.  Psychiatric/Behavioral:  Positive for depression. The patient is nervous/anxious.   All other systems reviewed and are negative. Blood pressure (!) 138/101, pulse (!) 116, temperature 98.2 F (36.8 C), temperature source Oral, resp. rate (!) 22, height 5' (1.524 m), weight 56.2 kg, SpO2 95 %. Body mass index is 24.22 kg/m.  Treatment Plan Summary: Major depressive disorder, recurrent, moderate: Continue Celexa 10 mg daily Follow up with psychiatrist tomorrow via tele-psych  Anxiety: Continue Valium 2 mg daily PRN  Insomnia: Continue Remeron 15 mg at bedtime Continue Seroquel 200 mg at bedtime  Disposition:  No evidence of imminent risk to self or others at present.   Patient does not meet criteria for psychiatric inpatient admission.  Waylan Boga, NP 04/04/2022 1:55 PM

## 2022-04-09 ENCOUNTER — Telehealth: Payer: Self-pay | Admitting: Student in an Organized Health Care Education/Training Program

## 2022-04-09 NOTE — Telephone Encounter (Signed)
Patient called about her missed appt. She has been in the hospital and was unable to talk to Dr. Holley Raring. She states the last procedure on her neck is doing well at this time. She is In a crisis situation right now and is trying to contact her psychiatrist. She will call back if she needs any further appts.

## 2022-04-15 ENCOUNTER — Encounter: Payer: Self-pay | Admitting: Emergency Medicine

## 2022-04-15 ENCOUNTER — Emergency Department

## 2022-04-15 ENCOUNTER — Emergency Department
Admission: EM | Admit: 2022-04-15 | Discharge: 2022-04-15 | Discharge status: Against Medical Advice | Attending: Emergency Medicine | Admitting: Emergency Medicine

## 2022-04-15 ENCOUNTER — Other Ambulatory Visit: Payer: Self-pay

## 2022-04-15 DIAGNOSIS — Z20822 Contact with and (suspected) exposure to covid-19: Secondary | ICD-10-CM | POA: Diagnosis not present

## 2022-04-15 DIAGNOSIS — Z853 Personal history of malignant neoplasm of breast: Secondary | ICD-10-CM | POA: Insufficient documentation

## 2022-04-15 DIAGNOSIS — J441 Chronic obstructive pulmonary disease with (acute) exacerbation: Secondary | ICD-10-CM | POA: Diagnosis not present

## 2022-04-15 DIAGNOSIS — F411 Generalized anxiety disorder: Secondary | ICD-10-CM | POA: Diagnosis present

## 2022-04-15 DIAGNOSIS — J45909 Unspecified asthma, uncomplicated: Secondary | ICD-10-CM | POA: Diagnosis not present

## 2022-04-15 DIAGNOSIS — R0602 Shortness of breath: Secondary | ICD-10-CM | POA: Diagnosis present

## 2022-04-15 DIAGNOSIS — F419 Anxiety disorder, unspecified: Secondary | ICD-10-CM | POA: Insufficient documentation

## 2022-04-15 LAB — CBC WITH DIFFERENTIAL/PLATELET
Abs Immature Granulocytes: 0.03 10*3/uL (ref 0.00–0.07)
Basophils Absolute: 0.1 10*3/uL (ref 0.0–0.1)
Basophils Relative: 1 %
Eosinophils Absolute: 0.1 10*3/uL (ref 0.0–0.5)
Eosinophils Relative: 2 %
HCT: 40.9 % (ref 36.0–46.0)
Hemoglobin: 13.2 g/dL (ref 12.0–15.0)
Immature Granulocytes: 0 %
Lymphocytes Relative: 19 %
Lymphs Abs: 1.4 10*3/uL (ref 0.7–4.0)
MCH: 30.5 pg (ref 26.0–34.0)
MCHC: 32.3 g/dL (ref 30.0–36.0)
MCV: 94.5 fL (ref 80.0–100.0)
Monocytes Absolute: 0.7 10*3/uL (ref 0.1–1.0)
Monocytes Relative: 9 %
Neutro Abs: 5.2 10*3/uL (ref 1.7–7.7)
Neutrophils Relative %: 69 %
Platelets: 299 10*3/uL (ref 150–400)
RBC: 4.33 MIL/uL (ref 3.87–5.11)
RDW: 13.4 % (ref 11.5–15.5)
WBC: 7.5 10*3/uL (ref 4.0–10.5)
nRBC: 0 % (ref 0.0–0.2)

## 2022-04-15 LAB — COMPREHENSIVE METABOLIC PANEL
ALT: 14 U/L (ref 0–44)
AST: 16 U/L (ref 15–41)
Albumin: 3.9 g/dL (ref 3.5–5.0)
Alkaline Phosphatase: 36 U/L — ABNORMAL LOW (ref 38–126)
Anion gap: 7 (ref 5–15)
BUN: 5 mg/dL — ABNORMAL LOW (ref 8–23)
CO2: 27 mmol/L (ref 22–32)
Calcium: 8.6 mg/dL — ABNORMAL LOW (ref 8.9–10.3)
Chloride: 98 mmol/L (ref 98–111)
Creatinine, Ser: 0.37 mg/dL — ABNORMAL LOW (ref 0.44–1.00)
GFR, Estimated: >60 mL/min (ref >60)
Glucose, Bld: 149 mg/dL — ABNORMAL HIGH (ref 70–99)
Potassium: 3.9 mmol/L (ref 3.5–5.1)
Sodium: 132 mmol/L — ABNORMAL LOW (ref 135–145)
Total Bilirubin: 0.4 mg/dL (ref 0.3–1.2)
Total Protein: 6.7 g/dL (ref 6.5–8.1)

## 2022-04-15 LAB — TROPONIN I (HIGH SENSITIVITY)
Troponin I (High Sensitivity): 4 ng/L (ref <18)
Troponin I (High Sensitivity): 4 ng/L (ref <18)

## 2022-04-15 LAB — RESP PANEL BY RT-PCR (FLU A&B, COVID) ARPGX2
Influenza A by PCR: NEGATIVE
Influenza B by PCR: NEGATIVE
SARS Coronavirus 2 by RT PCR: NEGATIVE

## 2022-04-15 IMAGING — DX DG CHEST 1V PORT
1 series · 1 of 1 positions shown · non-contrast
Comparison: [DATE]

CLINICAL DATA: Shortness of breath

EXAM:
PORTABLE CHEST 1 VIEW

[chest ap]
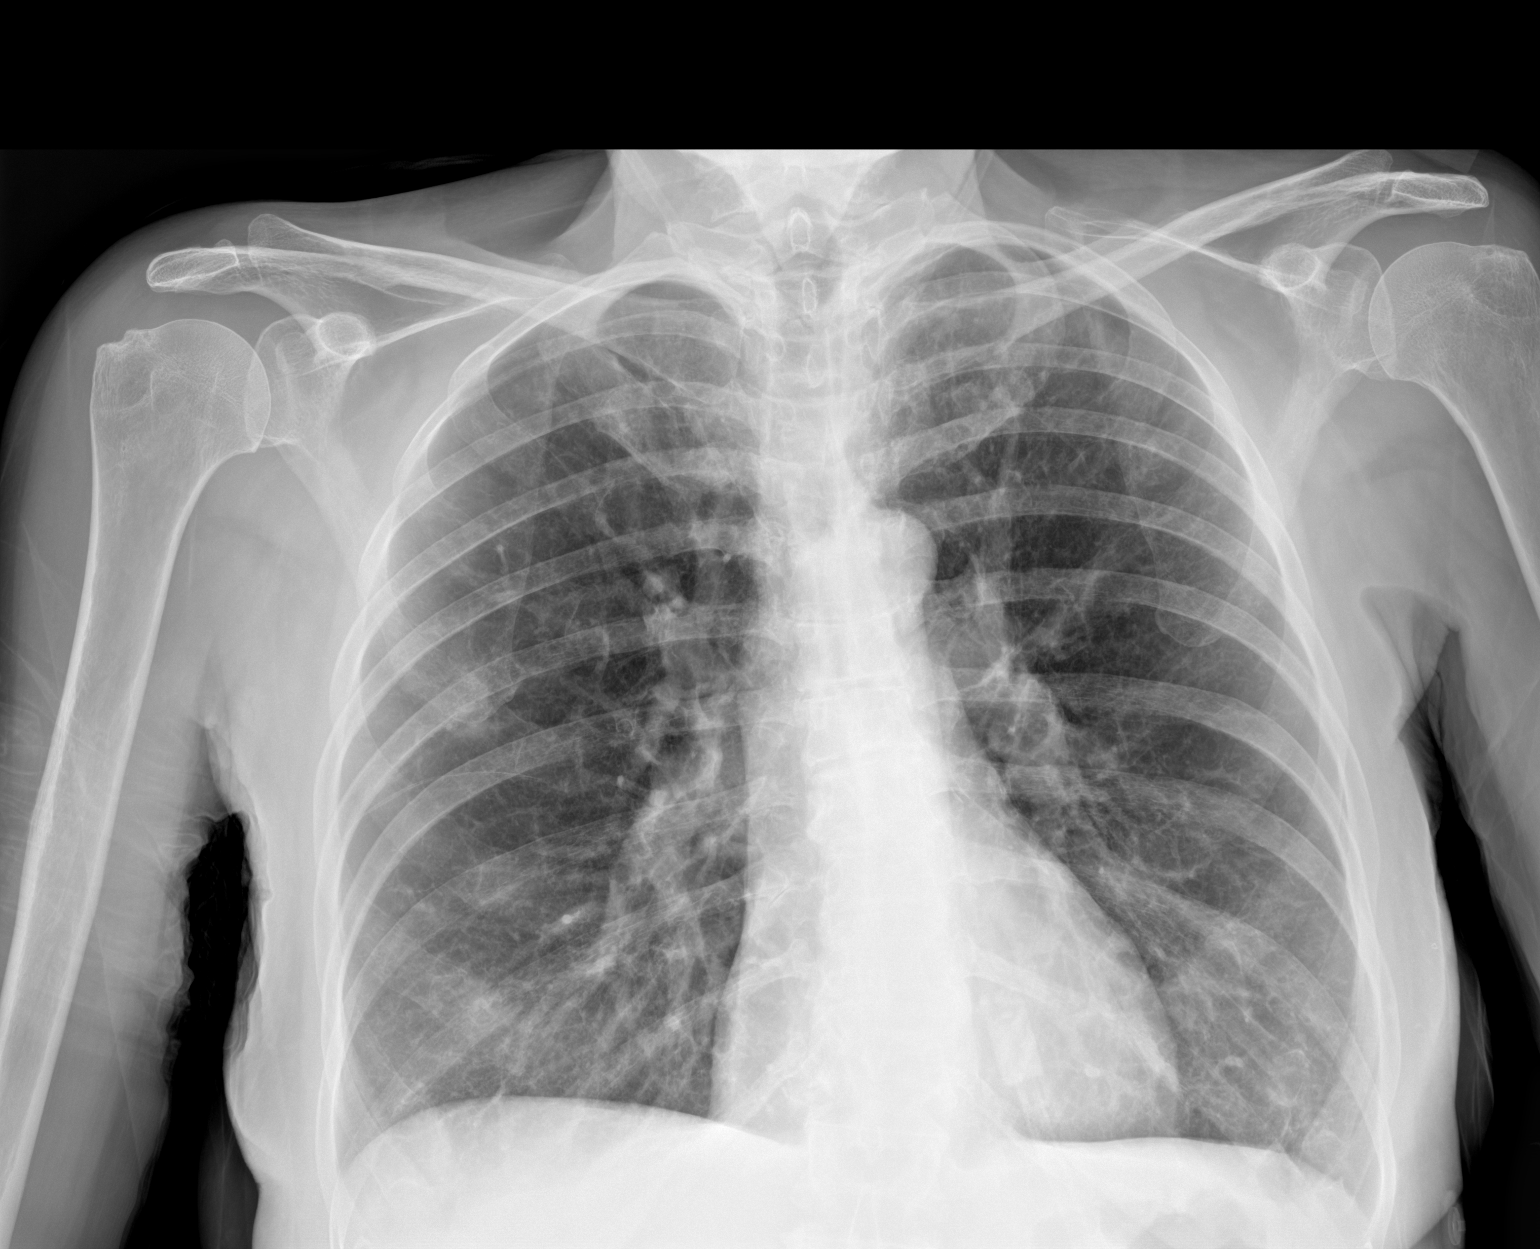

[1 of 1 positions shown; findings below may reference images not displayed]

FINDINGS: Generous lung volumes with apical emphysematous markings. There is
no edema, consolidation, effusion, or pneumothorax. Normal heart
size and mediastinal contours.
IMPRESSION: COPD without acute superimposed finding.

## 2022-04-15 MED ORDER — METHYLPREDNISOLONE SODIUM SUCC 125 MG IJ SOLR
125.0000 mg | Freq: Once | INTRAMUSCULAR | Status: AC
Start: 2022-04-15 — End: 2022-04-15
  Administered 2022-04-15: 125 mg via INTRAVENOUS
  Filled 2022-04-15: qty 2

## 2022-04-15 MED ORDER — IPRATROPIUM-ALBUTEROL 0.5-2.5 (3) MG/3ML IN SOLN
3.0000 mL | Freq: Once | RESPIRATORY_TRACT | Status: AC
  Administered 2022-04-15: 3 mL via RESPIRATORY_TRACT
  Filled 2022-04-15: qty 3

## 2022-04-15 MED ORDER — DIAZEPAM 2 MG PO TABS
2.0000 mg | ORAL_TABLET | Freq: Once | ORAL | Status: AC
  Administered 2022-04-15: 2 mg via ORAL
  Filled 2022-04-15: qty 1

## 2022-04-15 NOTE — Consult Note (Signed)
[facility] Face-to-Face Psychiatry Consult   Reason for Consult:  anxiety, passive SI Referring Physician:  Jari Pigg Patient Identification: Colleen [name] MRN:  [id number] Principal Diagnosis: GAD (generalized anxiety disorder) Diagnosis:  Principal Problem:   GAD (generalized anxiety disorder)   Total Time spent with patient: 45 minutes  Subjective:   Colleen [name] is a 61 y.o. female patient admitted with COPD exacerbation. Patient's spouse expressed concern for SI  HPI:  Patient is well-known to this facility. On evaluation, patient appears at her usual baseline. She endorses passive suicidal thoughts without a plan. 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 She feels lonely at times. She has 2 adult children who live out of state and states that they do not have good relationships, either and they "don't pay attention to me." She also reports chronic pain.  Patient was hoping that she could get an increase in her medication (valium) for anxiety. We discussed I would not do that, as she has an outpatient provider (Dr. Shea Evans who is managing her medications and that her COPD is a consideration with benzodiazepines in addition to concerns with that class of medication in general.  Patient again states that she is not actively suicidal and feels safe to be discharged. Writer contacted Dr. Shea Evans via secure chat and they were able to see patient on June 8 @ 1600.   Writer spoke with patient's husband, as he is the one who expressed concern that patient "might get a hold of something to hurt herself.'  I reiterated that he must lock up all knives or other things that patient could harm  herself with, as well as lock up her medications and be in control of her medications.  Patient's husband agrees. Husband was hoping we could admit patient  to our geriatric unit, but we cannot accommodate patient at this time and it is believed that she is able to return home with safety measures in place. Patient has an upcoming appointment on June 8 with outpatient provider that we have arranged. Husband agrees.   Past Psychiatric History: anxiety/depression  Risk to Self:   Risk to Others:   Prior Inpatient Therapy:   Prior Outpatient Therapy:    Past Medical History:  Past Medical History:  Diagnosis Date   Anxiety    Asthma    Breast cancer ([place]) 2004   left breast   Chronic back pain    COPD (chronic obstructive pulmonary disease) ([place])    Depression    History of kidney surgery    Personal history of radiation therapy    PTSD (post-traumatic stress disorder)     Past Surgical History:  Procedure Laterality Date   BREAST BIOPSY Left 2004   positive   BREAST BIOPSY Right    neg   BREAST LUMPECTOMY Left 2004   positive   LEFT HEART CATH AND CORONARY ANGIOGRAPHY N/A 11/06/2021   Procedure: LEFT HEART CATH AND CORONARY ANGIOGRAPHY;  Surgeon: Corey Skains, MD;  Location: [facility];  Service: Cardiovascular;  Laterality: N/A;   MASTECTOMY Left    MASTECTOMY     NEPHRECTOMY Left    TUBAL LIGATION  Family History:  Family History  Problem Relation Age of Onset   Breast cancer Cousin    Breast cancer Cousin    Bipolar disorder Daughter    Drug abuse Daughter    Alcohol abuse Maternal Grandfather    Family Psychiatric  History:  Social History:  Social History   Substance and Sexual Activity  Alcohol Use Never     Social History   Substance and Sexual Activity  Drug Use Never   Comment: prescribed muscle relaxers and valium    Social History   Socioeconomic History   Marital status: Married    Spouse name: Not on file   Number of  children: 2   Years of education: GED   Highest education level: Not on file  Occupational History   Not on file  Tobacco Use   Smoking status: Every Day    Packs/day: 0.50    Types: Cigarettes    Passive exposure: Never   Smokeless tobacco: Never  Vaping Use   Vaping Use: Never used  Substance and Sexual Activity   Alcohol use: Never   Drug use: Never    Comment: prescribed muscle relaxers and valium   Sexual activity: Not Currently  Other Topics Concern   Not on file  Social History Narrative   Not on file   Social Determinants of Health   Financial Resource Strain: Not on file  Food Insecurity: Not on file  Transportation Needs: Not on file  Physical Activity: Not on file  Stress: Not on file  Social Connections: Not on file   Additional Social History:    Allergies:   Allergies  Allergen Reactions   Desvenlafaxine Anaphylaxis   Morphine And Related Anaphylaxis   Penicillins Anaphylaxis    Has patient had a PCN reaction causing immediate rash, facial/tongue/throat swelling, SOB or lightheadedness with hypotension: Yes Has patient had a PCN reaction causing severe rash involving mucus membranes or skin necrosis: No Has patient had a PCN reaction that required hospitalization: Unknown Has patient had a PCN reaction occurring within the last 10 years: No If all of the above answers are "NO", then may proceed with Cephalosporin use.    Prazosin Other (See Comments)   Tamoxifen Anaphylaxis   Trazodone Anaphylaxis   Clonazepam    Codeine Swelling   Duloxetine Itching   Gabapentin Hives    Rash and swelling.    Hydroxyzine Itching   Lorazepam     Patient feels this medications makes her to sedated and wants to avoid   Paroxetine Hcl Hives   Sulfa Antibiotics Itching   Cephalexin Rash    Labs:  Results for orders placed or performed during the hospital encounter of 04/15/22 (from the past 48 hour(s))  CBC with Differential     Status: None   Collection  Time: 04/15/22  4:56 AM  Result Value Ref Range   WBC 7.5 4.0 - 10.5 K/uL   RBC 4.33 3.87 - 5.11 MIL/uL   Hemoglobin 13.2 12.0 - 15.0 g/dL   HCT 40.9 36.0 - 46.0 %   MCV 94.5 80.0 - 100.0 fL   MCH 30.5 26.0 - 34.0 pg   MCHC 32.3 30.0 - 36.0 g/dL   RDW 13.4 11.5 - 15.5 %   Platelets 299 150 - 400 K/uL   nRBC 0.0 0.0 - 0.2 %   Neutrophils Relative % 69 %   Neutro Abs 5.2 1.7 - 7.7 K/uL   Lymphocytes Relative 19 %   Lymphs Abs 1.4 0.7 -  4.0 K/uL   Monocytes Relative 9 %   Monocytes Absolute 0.7 0.1 - 1.0 K/uL   Eosinophils Relative 2 %   Eosinophils Absolute 0.1 0.0 - 0.5 K/uL   Basophils Relative 1 %   Basophils Absolute 0.1 0.0 - 0.1 K/uL   Immature Granulocytes 0 %   Abs Immature Granulocytes 0.03 0.00 - 0.07 K/uL    Comment: Performed at [facility], [address] [place], [place] [address]  Comprehensive metabolic panel     Status: Abnormal   Collection Time: 04/15/22  4:56 AM  Result Value Ref Range   Sodium 132 (L) 135 - 145 mmol/L   Potassium 3.9 3.5 - 5.1 mmol/L   Chloride 98 98 - 111 mmol/L   CO2 27 22 - 32 mmol/L   Glucose, Bld 149 (H) 70 - 99 mg/dL    Comment: Glucose reference range applies only to samples taken after fasting for at least 8 hours.   BUN 5 (L) 8 - 23 mg/dL   Creatinine, Ser 0.37 (L) 0.44 - 1.00 mg/dL   Calcium 8.6 (L) 8.9 - 10.3 mg/dL   Total Protein 6.7 6.5 - 8.1 g/dL   Albumin 3.9 3.5 - 5.0 g/dL   AST 16 15 - 41 U/L   ALT 14 0 - 44 U/L   Alkaline Phosphatase 36 (L) 38 - 126 U/L   Total Bilirubin 0.4 0.3 - 1.2 mg/dL   GFR, Estimated >60 >60 mL/min    Comment: (NOTE) Calculated using the CKD-EPI Creatinine Equation (2021)    Anion gap 7 5 - 15    Comment: Performed at [facility], [place], [place] [address]  Troponin I (High Sensitivity)     Status: None   Collection Time: 04/15/22  4:56 AM  Result Value Ref Range   Troponin I (High Sensitivity) 4 <18 ng/L    Comment: (NOTE) Elevated high  sensitivity troponin I (hsTnI) values and significant  changes across serial measurements may suggest ACS but many other  chronic and acute conditions are known to elevate hsTnI results.  Refer to the "Links" section for chest pain algorithms and additional  guidance. Performed at [facility], [place], [place] [address]   Troponin I (High Sensitivity)     Status: None   Collection Time: 04/15/22  7:13 AM  Result Value Ref Range   Troponin I (High Sensitivity) 4 <18 ng/L    Comment: (NOTE) Elevated high sensitivity troponin I (hsTnI) values and significant  changes across serial measurements may suggest ACS but many other  chronic and acute conditions are known to elevate hsTnI results.  Refer to the "Links" section for chest pain algorithms and additional  guidance. Performed at [facility], [place], [place] [address]   Resp Panel by RT-PCR (Flu A&B, Covid) Anterior Nasal Swab     Status: None   Collection Time: 04/15/22  9:00 AM   Specimen: Anterior Nasal Swab  Result Value Ref Range   SARS Coronavirus 2 by RT PCR NEGATIVE NEGATIVE    Comment: (NOTE) SARS-CoV-2 target nucleic acids are NOT DETECTED.  The SARS-CoV-2 RNA is generally detectable in upper respiratory specimens during the acute phase of infection. The lowest concentration of SARS-CoV-2 viral copies this assay can detect is 138 copies/mL. A negative result does not preclude SARS-Cov-2 infection and should not be used as the sole basis for treatment or other patient management decisions. A negative result may occur with  improper specimen collection/handling, submission of specimen other than nasopharyngeal swab, presence of viral mutation(s) within the areas targeted by this assay, and inadequate number of viral copies(<138 copies/mL). A negative result must be combined with clinical observations, patient history, and epidemiological information. The expected  result is Negative.  Fact Sheet for Patients:  EntrepreneurPulse.com.au  Fact Sheet for Healthcare Providers:  IncredibleEmployment.be  This test is no t yet approved or cleared by the [place] FDA and  has been authorized for detection and/or diagnosis of SARS-CoV-2 by FDA under an Emergency Use Authorization (EUA). This EUA will remain  in effect (meaning this test can be used) for the duration of the COVID-19 declaration under Section 564(b)(1) of the Act, 21 U.S.C.section 360bbb-3(b)(1), unless the authorization is terminated  or revoked sooner.       Influenza A by PCR NEGATIVE NEGATIVE   Influenza B by PCR NEGATIVE NEGATIVE    Comment: (NOTE) The Xpert Xpress SARS-CoV-2/FLU/RSV plus assay is intended as an aid in the diagnosis of influenza from Nasopharyngeal swab specimens and should not be used as a sole basis for treatment. Nasal washings and aspirates are unacceptable for Xpert Xpress SARS-CoV-2/FLU/RSV testing.  Fact Sheet for Patients: EntrepreneurPulse.com.au  Fact Sheet for Healthcare Providers: IncredibleEmployment.be  This test is not yet approved or cleared by the [place] FDA and has been authorized for detection and/or diagnosis of SARS-CoV-2 by FDA under an Emergency Use Authorization (EUA). This EUA will remain in effect (meaning this test can be used) for the duration of the COVID-19 declaration under Section 564(b)(1) of the Act, 21 U.S.C. section 360bbb-3(b)(1), unless the authorization is terminated or revoked.  Performed at [facility], [address] [place], [place] [address]     No current facility-administered medications for this encounter.   Current Outpatient Medications  Medication Sig Dispense Refill   cholecalciferol (VITAMIN D3) 25 MCG (1000 UNIT) tablet Take 1,000 Units by mouth daily.     citalopram (CELEXA) 10 MG tablet Take 1 tablet  (10 mg total) by mouth daily. 90 tablet 0   cyclobenzaprine (FLEXERIL) 10 MG tablet Take 1 tablet (10 mg total) by mouth 3 (three) times daily as needed for muscle spasms. 30 tablet 0   diazepam (VALIUM) 2 MG tablet Take 1 tablet (2 mg total) by mouth daily as needed for anxiety. For severe panic symptoms 15 tablet 1   Fluticasone-Umeclidin-Vilant (TRELEGY ELLIPTA) 100-62.5-25 MCG/INH AEPB Inhale 1 puff into the lungs daily.     montelukast (SINGULAIR) 10 MG tablet Take 10 mg by mouth daily.     vitamin B-12 (CYANOCOBALAMIN) 500 MCG tablet Take 500 mcg by mouth daily.     albuterol (VENTOLIN HFA) 108 (90 Base) MCG/ACT inhaler Inhale 1-2 puffs into the lungs every 4 (four) hours as needed for cough or wheezing.     denosumab (PROLIA) 60 MG/ML SOSY injection Inject 60 mg into the skin every 6 (six) months.     EPINEPHrine 0.3 mg/0.3 mL IJ SOAJ injection Inject 0.3 mg into the muscle as needed for anaphylaxis.     fluticasone (FLONASE) 50 MCG/ACT nasal spray Place 2 sprays into both nostrils daily. 9.9 mL 0   guaiFENesin-dextromethorphan (ROBITUSSIN DM) 100-10 MG/5ML syrup Take 10 mLs by mouth every 4 (four) hours as needed for cough. 118 mL 0   ipratropium (ATROVENT) 0.02 % nebulizer solution Take 0.25 mg by nebulization 4 (four) times daily.     QUEtiapine (SEROQUEL) 200 MG tablet Take 1 tablet (200 mg total)  by mouth at bedtime. (Patient not taking: Reported on 04/15/2022) 90 tablet 0    Musculoskeletal: Strength & Muscle Tone: within normal limits Gait & Station: normal Patient leans: N/A   Psychiatric Specialty Exam:  Presentation  General Appearance: Appropriate for Environment  Eye Contact:Good  Speech:Clear and Coherent  Speech Volume:Normal  Handedness:No data recorded  Mood and Affect  Mood:Anxious  Affect:Congruent   Thought Process  Thought Processes:Coherent  Descriptions of Associations:Intact  Orientation:Full (Time, Place and Person)  Thought  Content:WDL  History of Schizophrenia/Schizoaffective disorder:No data recorded Duration of Psychotic Symptoms:No data recorded Hallucinations:Hallucinations: None  Ideas of Reference:None  Suicidal Thoughts:Suicidal Thoughts: No  Homicidal Thoughts:Homicidal Thoughts: No   Sensorium  Memory:Immediate Good  Judgment:Fair  Insight:Fair   Executive Functions  Concentration:Fair  Attention Span:Good  [place] of Knowledge:Good  Language:Good   Psychomotor Activity  Psychomotor Activity:Psychomotor Activity: Normal  Assets  Assets:Communication Skills; Desire for Improvement; Financial Resources/Insurance; Housing; Intimacy; Social Support; Resilience   Sleep  Sleep:Sleep: Fair  Physical Exam: Physical Exam Vitals and nursing note reviewed.  HENT:     Head: Normocephalic.     Nose: No congestion or rhinorrhea.  Eyes:     General:        Right eye: No discharge.        Left eye: No discharge.  Cardiovascular:     Rate and Rhythm: Normal rate.  Pulmonary:     Effort: Pulmonary effort is normal.     Comments: On 2L O2 via nasal canula  Musculoskeletal:        General: Normal range of motion.     Cervical back: Normal range of motion.  Skin:    General: Skin is dry.  Neurological:     Mental Status: She is alert and oriented to person, place, and time.  Psychiatric:        Attention and Perception: Attention normal.        Mood and Affect: Affect is labile (irritable at first) and blunt.        Speech: Speech normal.        Behavior: Behavior is cooperative.        Thought Content: Thought content normal. Thought content is not paranoid or delusional. Thought content does not include homicidal or suicidal ideation.        Cognition and Memory: Cognition normal.        Judgment: Judgment normal.   Review of Systems  Respiratory: Negative.         COPD on 2L O2 via nasal canula   Skin: Negative.   Psychiatric/Behavioral:  Positive for  depression (chronic/stable, at baseline). Negative for hallucinations, memory loss, substance abuse and suicidal ideas. The patient is nervous/anxious and has insomnia.   Blood pressure 130/78, pulse 100, temperature 98.2 F (36.8 C), temperature source Oral, resp. rate 18, height 5' (1.524 m), weight 54.4 kg, SpO2 96 %. Body mass index is 23.44 kg/m.  Treatment Plan Summary: Patient appears to be at her baseline anxiety/irritability. Writer contacted patient's outpatient psychiatrist (Dr. Shea Evans) via secure chat to update her on staus. Dr. Shea Evans replied, agreed that we should not increase valium. Dr. Charlcie Cradle office sent writer secure chat @ 1700, stating that the made an appointment for patient on June 8 at 1600.   Disposition: No evidence of imminent risk to self or others at present.   Supportive therapy provided about ongoing stressors. Discussed crisis plan, support from social network, calling 911, coming to the Emergency Department,  and calling Suicide Hotline.  Sherlon Handing, NP 04/15/2022 6:34 PM

## 2022-04-15 NOTE — ED Notes (Addendum)
Pt states she just wants to go home  states  NP from beh med was in room and told her that she could go home  Pt is still very anxious  Dr Jari Pigg aware

## 2022-04-15 NOTE — ED Triage Notes (Signed)
Pt to triage via w/c with no distress noted; pt c/o SHOB this morning, prod cough clear sputum; st hx COPD; st "I always get like this when I get stressed out and my husband is stressing me out"

## 2022-04-15 NOTE — BH Assessment (Signed)
Comprehensive Clinical Assessment (CCA) Screening, Triage and Referral Note  04/15/2022 Colleen [name] [id number]  [facility], 61 year old female who presents to [facility] ED voluntarily for treatment. Per triage note, Pt to triage via w/c with no distress noted; pt c/o SHOB this morning, prod cough clear sputum; st hx COPD; st "I always get like this when I get stressed out and my husband is stressing me out"   During TTS assessment pt presents alert and oriented x 4, anxious but cooperative, and mood-congruent with affect. The pt does not appear to be responding to internal or external stimuli. Neither is the pt presenting with any delusional thinking. Pt verified the information provided to triage RN.   Pt identifies her main complaint to be that she is in pain and she continues to have anxiety along with panic attacks. Patient reports having both poor sleep and eating habits. Patient states she wakes up during the night in a panic. Patient states she is compliant with her medications and taking them as prescribed. Patient thinks her meds need to be modified. "'2mg'$  is not enough." Patient is being followed by Colleen [name]. Patient denies using any illicit substances or alcohol. Pt reports a medical hx of COPD, depression, and osteoporosis. Pt denies HI/AH/VH. Patient endorses passive SI and states she becomes so frustrated with her anxiety that sometimes she feels that way. Pt contracts for safety. Patient reports she is going to contact her psychiatrist to schedule an appointment and adjust her meds.    Per Colleen Share, NP, pt does not meet criteria for inpatient psychiatric admission.    Chief Complaint:  Chief Complaint  Patient presents with   Shortness of Breath   Visit Diagnosis: Anxiety  Patient Reported Information How did you hear about [place]? Self  What Is the Reason for Your Visit/Call Today? Patient presents to the ED with shortness of breath and back pain.  How Long Has This Been Causing  You Problems? > than 6 months  What Do You Feel Would Help You the Most Today? Medication(s); Stress Management   Have You Recently Had Any Thoughts About Hurting Yourself? Yes  Are You Planning to Commit Suicide/Harm Yourself At This time? No   Have you Recently Had Thoughts About [place]? No  Are You Planning to Harm Someone at This Time? No  Explanation: No data recorded  Have You Used Any Alcohol or Drugs in the Past 24 Hours? No  How Long Ago Did You Use Drugs or Alcohol? No data recorded What Did You Use and How Much? No data recorded  Do You Currently Have a Therapist/Psychiatrist? Yes  Name of Therapist/Psychiatrist: Dr. Shea [name]   Have You Been Recently Discharged From Any Office Practice or Programs? No  Explanation of Discharge From Practice/Program: No data recorded   CCA Screening Triage Referral Assessment Type of Contact: Face-to-Face  Telemedicine Service Delivery:   Is this Initial or Reassessment? No data recorded Date Telepsych consult ordered in CHL:  No data recorded Time Telepsych consult ordered in CHL:  No data recorded Location of Assessment: [facility] ED  Provider Location: [facility] ED   Collateral Involvement: None provided   Does Patient Have a Malo? No data recorded Name and Contact of Legal Guardian: No data recorded If Minor and Not Living with Parent(s), Who has Custody? n/a  Is CPS involved or ever been involved? Never  Is APS involved or ever been involved? Never   Patient Determined To Be At Risk  for Harm To Self or Others Based on Review of Patient Reported Information or Presenting Complaint? No  Method: No data recorded Availability of Means: No data recorded Intent: No data recorded Notification Required: No data recorded Additional Information for Danger to Others Potential: No data recorded Additional Comments for Danger to Others Potential: No data recorded Are There Guns or Other  Weapons in Your Home? No data recorded Types of Guns/Weapons: No data recorded Are These Weapons Safely Secured?                            No data recorded Who Could Verify You Are Able To Have These Secured: No data recorded Do You Have any Outstanding Charges, Pending Court Dates, Parole/Probation? No data recorded Contacted To Inform of Risk of Harm To Self or Others: No data recorded  Does Patient Present under Involuntary Commitment? No  IVC Papers Initial File Date: No data recorded  [place] of Residence: [place]   Patient Currently Receiving the Following Services: Individual Therapy; Medication Management   Determination of Need: Emergent (2 hours)   Options For Referral: ED Visit; Medication Management; Outpatient Therapy; Intensive Outpatient Therapy   Discharge Disposition:     Colleen [name], Counselor, LCAS-A

## 2022-04-15 NOTE — ED Provider Notes (Signed)
[facility] Provider Note    Event Date/Time   First MD Initiated Contact with Patient 04/15/22 [id number]     (approximate)   History   Shortness of Breath   HPI  Yarexi Pawlicki is a 61 y.o. female with history of COPD, anxiety who comes in with concerns for shortness of breath.  Patient is on 2 L of oxygen at nighttime, has asthma, depression, anxiety, left breast cancer status post radiation therapy, PTSD who comes in with concerns for shortness of breath.  She just reports having a lot of anxiety and some thoughts of SI where her husband is at bedside and reports that he had to hide her knives.  She reports that she was supposed to be a pulmonologist tomorrow but her appointment was canceled.  I reviewed the records however and she is supposed to be seen now on 6/8 in 2 days.  She reports she does not have an outpatient psychiatrist appointment for the next month.  When she was here 2 weeks ago she had negative D-dimer on review of records.  She just reports getting sweaty overnight and having some more shortness of breath which is why she presented to the emergency room today   Physical Exam   Triage Vital Signs: ED Triage Vitals [04/15/22 0452]  Enc Vitals Group     BP 128/80     Pulse Rate (!) 110     Resp 20     Temp 98.2 F (36.8 C)     Temp Source Oral     SpO2 95 %     Weight 120 lb (54.4 kg)     Height 5' (1.524 m)     Head Circumference      Peak Flow      Pain Score 10     Pain Loc      Pain Edu?      Excl. in [place]?     Most recent vital signs: Vitals:   04/15/22 0452 04/15/22 0815  BP: 128/80 130/78  Pulse: (!) 110 100  Resp: 20 18  Temp: 98.2 F (36.8 C)   SpO2: 95% 96%     General: Awake, no distress.  CV:  Good peripheral perfusion.  Resp:  Normal effort.  Mild increased work of breathing on her 2 L of oxygen with some minimal wheezing Abd:  No distention.  Other:     ED Results / Procedures / Treatments   Labs (all  labs ordered are listed, but only abnormal results are displayed) Labs Reviewed  COMPREHENSIVE METABOLIC PANEL - Abnormal; Notable for the following components:      Result Value   Sodium 132 (*)    Glucose, Bld 149 (*)    BUN 5 (*)    Creatinine, Ser 0.37 (*)    Calcium 8.6 (*)    Alkaline Phosphatase 36 (*)    All other components within normal limits  RESP PANEL BY RT-PCR (FLU A&B, COVID) ARPGX2  CBC WITH DIFFERENTIAL/PLATELET  TROPONIN I (HIGH SENSITIVITY)  TROPONIN I (HIGH SENSITIVITY)     EKG  My interpretation of EKG:  Sinus tachycardia rate of 112 without any ST elevation or T wave inversions, normal intervals  RADIOLOGY I have reviewed the xray personally and interpreted it and I do see evidence of COPD but no pneumonia   PROCEDURES:  Critical Care performed: No  Procedures   MEDICATIONS ORDERED IN ED: Medications  ipratropium-albuterol (DUONEB) 0.5-2.5 (3) MG/3ML nebulizer solution 3 mL (  3 mLs Nebulization Given 04/15/22 0847)  ipratropium-albuterol (DUONEB) 0.5-2.5 (3) MG/3ML nebulizer solution 3 mL (3 mLs Nebulization Given 04/15/22 0845)  ipratropium-albuterol (DUONEB) 0.5-2.5 (3) MG/3ML nebulizer solution 3 mL (3 mLs Nebulization Given 04/15/22 0847)  methylPREDNISolone sodium succinate (SOLU-MEDROL) 125 mg/2 mL injection 125 mg (125 mg Intravenous Given 04/15/22 0847)     IMPRESSION / MDM / ASSESSMENT AND PLAN / ED COURSE  I reviewed the triage vital signs and the nursing notes.   Patient's presentation is most consistent with acute presentation with potential threat to life or bodily function.  Differential includes ACS, COPD, anxiety.  I had a lengthy discussion with patient and her husband that allow time mental health can coincide with her people's feelings of shortness of breath and although she does have COPD it could just be exacerbating her difficulty of breathing.  I do not feel that this is a pulmonary embolism given negative D-dimer recently.  I  have seen patient before and she actually looks better than when I typically see her from a breathing standpoint but I do feel that there is probably a significant amount of anxiety and mental health contributing to her symptoms.  Therefore I discussed with patient and her husband and they are okay with proceeding with psychiatric consultation.  Troponins are negative x2.  CBC no anemia.  CMP shows sodium up to 132.    9:48 AM reevaluated patient no significant wheezing.  She does report that she feels that she is having a panic attack and patient was given some Valium that she takes at home.   Patient was seen by psychiatry and they are planning to discharge patient per the patient but I requested that she did not leave until I got confirmed that they were going to discharge her.  However patient was seen eloping out of the emergency room.  Did get a call from psychiatry that they were going to discharge her and they do not feel like she needed IVC    FINAL CLINICAL IMPRESSION(S) / ED DIAGNOSES   Final diagnoses:  COPD exacerbation ([place])  Anxiety     Rx / DC Orders   ED Discharge Orders     None        Note:  This document was prepared using Dragon voice recognition software and may include unintentional dictation errors.   Vanessa Felt, MD 04/15/22 1326

## 2022-04-15 NOTE — ED Notes (Signed)
See triage note  presents with some dif breathing  states she has a hx of COPD  and also has been under a lot of stress lately    alos has has some SI thoughts   denies any plan at present

## 2022-04-16 ENCOUNTER — Other Ambulatory Visit: Payer: Self-pay | Admitting: Student in an Organized Health Care Education/Training Program

## 2022-04-17 ENCOUNTER — Encounter: Payer: Self-pay | Admitting: Psychiatry

## 2022-04-17 ENCOUNTER — Telehealth (INDEPENDENT_AMBULATORY_CARE_PROVIDER_SITE_OTHER): Admitting: Psychiatry

## 2022-04-17 DIAGNOSIS — G4701 Insomnia due to medical condition: Secondary | ICD-10-CM

## 2022-04-17 DIAGNOSIS — F4310 Post-traumatic stress disorder, unspecified: Secondary | ICD-10-CM

## 2022-04-17 DIAGNOSIS — F331 Major depressive disorder, recurrent, moderate: Secondary | ICD-10-CM | POA: Insufficient documentation

## 2022-04-17 DIAGNOSIS — F17200 Nicotine dependence, unspecified, uncomplicated: Secondary | ICD-10-CM

## 2022-04-17 DIAGNOSIS — F411 Generalized anxiety disorder: Secondary | ICD-10-CM

## 2022-04-17 MED ORDER — FLUOXETINE HCL 20 MG PO TABS: 20.0000 mg | ORAL_TABLET | Freq: Every day | ORAL | 0 refills | Status: DC

## 2022-04-17 NOTE — Patient Instructions (Signed)
Fluoxetine Capsules or Tablets (Depression/Mood Disorders) What is this medication? FLUOXETINE (floo OX e teen) treats depression, anxiety, obsessive-compulsive disorder (OCD), and eating disorders. It increases the amount of serotonin in the brain, a hormone that helps regulate mood. It belongs to a group of medications called SSRIs. This medicine may be used for other purposes; ask your health care provider or pharmacist if you have questions. COMMON BRAND NAME(S): Prozac What should I tell my care team before I take this medication? They need to know if you have any of these conditions: Bipolar disorder or a family history of bipolar disorder Bleeding disorders Glaucoma Heart disease Liver disease Low levels of sodium in the blood Seizures Suicidal thoughts, plans, or attempt; a previous suicide attempt by you or a family member Take MAOIs like Carbex, Eldepryl, Marplan, Nardil, and Parnate Take medications that treat or prevent blood clots Thyroid disease An unusual or allergic reaction to fluoxetine, other medications, foods, dyes, or preservatives Pregnant or trying to get pregnant Breast-feeding How should I use this medication? Take this medication by mouth with a glass of water. Follow the directions on the prescription label. You can take this medication with or without food. Take your medication at regular intervals. Do not take it more often than directed. Do not stop taking this medication suddenly except upon the advice of your care team. Stopping this medication too quickly may cause serious side effects or your condition may worsen. A special MedGuide will be given to you by the pharmacist with each prescription and refill. Be sure to read this information carefully each time. Talk to your care team about the use of this medication in children. While it may be prescribed for children as young as 7 years for selected conditions, precautions do apply. Overdosage: If you think  you have taken too much of this medicine contact a poison control center or emergency room at once. NOTE: This medicine is only for you. Do not share this medicine with others. What if I miss a dose? If you miss a dose, skip the missed dose and go back to your regular dosing schedule. Do not take double or extra doses. What may interact with this medication? Do not take this medication with any of the following: Other medications containing fluoxetine, like Sarafem or Symbyax Cisapride Dronedarone Linezolid MAOIs like Carbex, Eldepryl, Marplan, Nardil, and Parnate Methylene blue (injected into a vein) Pimozide Thioridazine This medication may also interact with the following: Alcohol Amphetamines Aspirin and aspirin-like medications Carbamazepine Certain medications for depression, anxiety, or psychotic disturbances Certain medications for migraine headaches like almotriptan, eletriptan, frovatriptan, naratriptan, rizatriptan, sumatriptan, zolmitriptan Digoxin Diuretics Fentanyl Flecainide Furazolidone Isoniazid Lithium Medications for sleep Medications that treat or prevent blood clots like warfarin, enoxaparin, and dalteparin NSAIDs, medications for pain and inflammation, like ibuprofen or naproxen Other medications that prolong the QT interval (an abnormal heart rhythm) Phenytoin Procarbazine Propafenone Rasagiline Ritonavir Supplements like St. John's wort, kava kava, valerian Tramadol Tryptophan Vinblastine This list may not describe all possible interactions. Give your health care provider a list of all the medicines, herbs, non-prescription drugs, or dietary supplements you use. Also tell them if you smoke, drink alcohol, or use illegal drugs. Some items may interact with your medicine. What should I watch for while using this medication? Tell your care team if your symptoms do not get better or if they get worse. Visit your care team for regular checks on your  progress. Because it may take several weeks to see the   full effects of this medication, it is important to continue your treatment as prescribed. Watch for new or worsening thoughts of suicide or depression. This includes sudden changes in mood, behavior, or thoughts. These changes can happen at any time but are more common in the beginning of treatment or after a change in dose. Call your care team right away if you experience these thoughts or worsening depression. Manic episodes may happen in patients with bipolar disorder who take this medication. Watch for changes in feelings or behaviors such as feeling anxious, nervous, agitated, panicky, irritable, hostile, aggressive, impulsive, severely restless, overly excited and hyperactive, or trouble sleeping. These symptoms can happen at anytime but are more common in the beginning of treatment or after a change in dose. Call your care team right away if you notice any of these symptoms. You may get drowsy or dizzy. Do not drive, use machinery, or do anything that needs mental alertness until you know how this medication affects you. Do not stand or sit up quickly, especially if you are an older patient. This reduces the risk of dizzy or fainting spells. Alcohol may interfere with the effect of this medication. Avoid alcoholic drinks. Your mouth may get dry. Chewing sugarless gum or sucking hard candy, and drinking plenty of water may help. Contact your care team if the problem does not go away or is severe. This medication may affect blood sugar levels. If you have diabetes, check with your care team before you make changes to your diet or medications. What side effects may I notice from receiving this medication? Side effects that you should report to your care team as soon as possible: Allergic reactions--skin rash, itching, hives, swelling of the face, lips, tongue, or throat Bleeding--bloody or black, tar-like stools, red or dark brown urine, vomiting  blood or brown material that looks like coffee grounds, small, red or purple spots on skin, unusual bleeding or bruising Heart rhythm changes--fast or irregular heartbeat, dizziness, feeling faint or lightheaded, chest pain, trouble breathing Loss of appetite with weight loss Low sodium level--muscle weakness, fatigue, dizziness, headache, confusion Serotonin syndrome--irritability, confusion, fast or irregular heartbeat, muscle stiffness, twitching muscles, sweating, high fever, seizure, chills, vomiting, diarrhea Sudden eye pain or change in vision such as blurry vision, seeing halos around lights, vision loss Thoughts of suicide or self-harm, worsening mood, feelings of depression Side effects that usually do not require medical attention (report to your care team if they continue or are bothersome): Anxiety, nervousness Change in sex drive or performance Diarrhea Dry mouth Headache Excessive sweating Nausea Tremors or shaking Trouble sleeping Upset stomach This list may not describe all possible side effects. Call your doctor for medical advice about side effects. You may report side effects to FDA at 1-800-FDA-1088. Where should I keep my medication? Keep out of the reach of children and pets. Store at room temperature between 15 and 30 degrees C (59 and 86 degrees F). Get rid of any unused medication after the expiration date. NOTE: This sheet is a summary. It may not cover all possible information. If you have questions about this medicine, talk to your doctor, pharmacist, or health care provider.  2023 [employer]/Gold Standard (2021-01-09 00:00:00)

## 2022-04-17 NOTE — Progress Notes (Signed)
Virtual Visit via Video Note  I connected with Colleen [name] on 04/17/22 at  4:00 PM EDT by a video enabled telemedicine application and verified that I am speaking with the correct person using two identifiers.  Location Provider Location : [facility] Patient Location : Home  Participants: Patient , Provider   I discussed the limitations of evaluation and management by telemedicine and the availability of in person appointments. The patient expressed understanding and agreed to proceed.   I discussed the assessment and treatment plan with the patient. The patient was provided an opportunity to ask questions and all were answered. The patient agreed with the plan and demonstrated an understanding of the instructions.   The patient was advised to call back or seek an in-person evaluation if the symptoms worsen or if the condition fails to improve as anticipated.   Ford Cliff MD OP Progress Note  04/17/2022 6:38 PM Leyla Soliz  MRN:  [id number]  Chief Complaint:  Chief Complaint  Patient presents with   Follow-up: 61 year old Caucasian female with history of MDD, PTSD, insomnia, chronic back pain, presented for medication management.   HPI: Colleen [name] is a 61 year old Caucasian female, married, lives in [place], has a history of PTSD, MDD, insomnia, chronic back pain, COPD, hyponatremia, history of mastectomy, radiation therapy per history, surgical history of nephrectomy, tubal ligation, chronic hypoxic respiratory failure, pleuritic chest pain was evaluated by telemedicine today.  Patient was seen in the emergency department at [facility]-04/15/2022 for COPD exacerbation as well as anxiety exacerbation.  Patient was medically cleared and it was discussed with patient that likely there was an anxiety component to her shortness of breath.  Patient was also evaluated by psychiatric consult team-Ms. Virgina Jock communicated with Probation officer regarding this patient.  Patient  hence was scheduled to be seen today for a follow-up appointment at our practice.   Patient today reports she stopped all her psychotropic medications except the Valium few weeks ago.  She reports she woke up 1 night and felt the walls were closing in on her as well as could not breathe and had chest pain.  Patient reports she hence decided the Celexa and the Seroquel could have been contributing to this side effects.  She hence stopped it.  Patient today reports since stopping the Seroquel and the Celexa she has not had that kind of panic attacks.  She however contradicts herself by saying that she has been struggling and would like her Valium dosage to be increased if possible.  When it was discussed with patient that writer do not recommend increasing her Valium rather she needs to be started on an SSRI or an SNRI to address her anxiety symptoms , patient agreeable.  Patient reports she currently struggles with sadness, low motivation, low energy, anxiety symptoms, sleep problems, all these symptoms also exacerbated by her COPD.  Patient reports she had a pulmonology visit today and was started on prednisone and antibiotics every other day.  She has not started that yet.  Patient denies any current suicidality.  Patient does have good support system from her spouse.  Agreeable to go to the nearest emergency department if she has any worsening mood symptoms or suicidality.       Visit Diagnosis:    ICD-10-CM   1. MDD (major depressive disorder), recurrent episode, moderate (HCC)  F33.1 FLUoxetine (PROZAC) 20 MG tablet    2. GAD (generalized anxiety disorder)  F41.1 FLUoxetine (PROZAC) 20 MG tablet    3. PTSD (post-traumatic  stress disorder)  F43.10 FLUoxetine (PROZAC) 20 MG tablet    4. Insomnia due to medical condition  G47.01    mood, pain    5. Tobacco use disorder  F17.200       Past Psychiatric History: Reviewed past psychiatric history from progress note on 02/12/2021.  Past trials  of Paxil, Klonopin, trazodone, prazosin, desvenlafaxine, lorazepam.  Past Medical History:  Past Medical History:  Diagnosis Date   Anxiety    Asthma    Breast cancer ([place]) 2004   left breast   Chronic back pain    COPD (chronic obstructive pulmonary disease) ([place])    Depression    History of kidney surgery    Personal history of radiation therapy    PTSD (post-traumatic stress disorder)     Past Surgical History:  Procedure Laterality Date   BREAST BIOPSY Left 2004   positive   BREAST BIOPSY Right    neg   BREAST LUMPECTOMY Left 2004   positive   LEFT HEART CATH AND CORONARY ANGIOGRAPHY N/A 11/06/2021   Procedure: LEFT HEART CATH AND CORONARY ANGIOGRAPHY;  Surgeon: Corey Skains, MD;  Location: [facility];  Service: Cardiovascular;  Laterality: N/A;   MASTECTOMY Left    MASTECTOMY     NEPHRECTOMY Left    TUBAL LIGATION      Family Psychiatric History: Reviewed family psychiatric history from progress note on 03/04/2021.  Family History:  Family History  Problem Relation Age of Onset   Breast cancer Cousin    Breast cancer Cousin    Bipolar disorder Daughter    Drug abuse Daughter    Alcohol abuse Maternal Grandfather     Social History: Reviewed social history from progress note on 03/04/2021. Social History   Socioeconomic History   Marital status: Married    Spouse name: Not on file   Number of children: 2   Years of education: GED   Highest education level: Not on file  Occupational History   Not on file  Tobacco Use   Smoking status: Every Day    Packs/day: 0.50    Types: Cigarettes    Passive exposure: Never   Smokeless tobacco: Never  Vaping Use   Vaping Use: Never used  Substance and Sexual Activity   Alcohol use: Never   Drug use: Never    Comment: prescribed muscle relaxers and valium   Sexual activity: Not Currently  Other Topics Concern   Not on file  Social History Narrative   Not on file   Social Determinants of Health    Financial Resource Strain: Not on file  Food Insecurity: Not on file  Transportation Needs: Not on file  Physical Activity: Not on file  Stress: Not on file  Social Connections: Not on file    Allergies:  Allergies  Allergen Reactions   Desvenlafaxine Anaphylaxis   Morphine And Related Anaphylaxis   Penicillins Anaphylaxis    Has patient had a PCN reaction causing immediate rash, facial/tongue/throat swelling, SOB or lightheadedness with hypotension: Yes Has patient had a PCN reaction causing severe rash involving mucus membranes or skin necrosis: No Has patient had a PCN reaction that required hospitalization: Unknown Has patient had a PCN reaction occurring within the last 10 years: No If all of the above answers are "NO", then may proceed with Cephalosporin use.    Prazosin Other (See Comments)   Tamoxifen Anaphylaxis   Trazodone Anaphylaxis   Clonazepam    Codeine Swelling   Duloxetine Itching  Gabapentin Hives    Rash and swelling.    Hydroxyzine Itching   Lorazepam     Patient feels this medications makes her to sedated and wants to avoid   Paroxetine Hcl Hives   Sulfa Antibiotics Itching   Cephalexin Rash    Metabolic Disorder Labs: Lab Results  Component Value Date   HGBA1C 5.8 (H) 12/18/2021   MPG 119.76 12/18/2021   MPG 108.28 10/12/2018   No results found for: "PROLACTIN" Lab Results  Component Value Date   CHOL 180 11/05/2021   TRIG 49 11/05/2021   HDL 67 11/05/2021   CHOLHDL 2.7 11/05/2021   VLDL 10 11/05/2021   LDLCALC 103 (H) 11/05/2021   Lab Results  Component Value Date   TSH 1.074 02/22/2022   TSH 0.864 10/21/2021    Therapeutic Level Labs: No results found for: "LITHIUM" No results found for: "VALPROATE" No results found for: "CBMZ"  Current Medications: Current Outpatient Medications  Medication Sig Dispense Refill   albuterol (VENTOLIN HFA) 108 (90 Base) MCG/ACT inhaler Inhale 1-2 puffs into the lungs every 4 (four) hours as  needed for cough or wheezing.     cholecalciferol (VITAMIN D3) 25 MCG (1000 UNIT) tablet Take 1,000 Units by mouth daily.     cyclobenzaprine (FLEXERIL) 10 MG tablet Take 1 tablet (10 mg total) by mouth 3 (three) times daily as needed for muscle spasms. 30 tablet 0   denosumab (PROLIA) 60 MG/ML SOSY injection Inject 60 mg into the skin every 6 (six) months.     diazepam (VALIUM) 2 MG tablet Take 1 tablet (2 mg total) by mouth daily as needed for anxiety. For severe panic symptoms 15 tablet 1   EPINEPHrine 0.3 mg/0.3 mL IJ SOAJ injection Inject 0.3 mg into the muscle as needed for anaphylaxis.     FLUoxetine (PROZAC) 20 MG tablet Take 1 tablet (20 mg total) by mouth daily with breakfast. 30 tablet 0   Fluticasone-Umeclidin-Vilant (TRELEGY ELLIPTA) 100-62.5-25 MCG/INH AEPB Inhale 1 puff into the lungs daily.     vitamin B-12 (CYANOCOBALAMIN) 500 MCG tablet Take 500 mcg by mouth daily.     No current facility-administered medications for this visit.     Musculoskeletal: Strength & Muscle Tone:  UTA Gait & Station:  Seated Patient leans: N/A  Psychiatric Specialty Exam: Review of Systems  Constitutional:  Positive for fatigue.  Respiratory:  Positive for shortness of breath (chronic on O2).   Psychiatric/Behavioral:  Positive for decreased concentration, dysphoric mood and sleep disturbance. The patient is nervous/anxious.     There were no vitals taken for this visit.There is no height or weight on file to calculate BMI.  General Appearance: Casual  Eye Contact:  Fair  Speech:  Normal Rate  Volume:  Normal  Mood:  Anxious  Affect:  Congruent  Thought Process:  Goal Directed and Descriptions of Associations: Intact  Orientation:  Full (Time, Place, and Person)  Thought Content: Logical   Suicidal Thoughts:  No  Homicidal Thoughts:  No  Memory:  Immediate;   Fair Recent;   Fair Remote;   Fair  Judgement:  Fair  Insight:  Shallow  Psychomotor Activity:  Normal  Concentration:   Concentration: Fair and Attention Span: Fair  Recall:  [employer] of Knowledge: Fair  Language: Fair  Akathisia:  No  Handed:  Right  AIMS (if indicated): not done  Assets:  Communication Skills Desire for Improvement Housing Intimacy Social Support  ADL's:  Intact  Cognition: WNL  Sleep:  Poor   Screenings: AUDIT    Flowsheet Row Admission (Discharged) from 12/13/2021 in [facility]  Alcohol Use Disorder Identification Test Final Score (AUDIT) 0      GAD-7    Flowsheet Row Video Visit from 04/17/2022 in [facility] Video Visit from 02/20/2022 in [facility] Video Visit from 12/13/2021 in [facility] Video Visit from 11/19/2021 in [facility] Video Visit from 10/23/2021 in [facility]  Total GAD-7 Score '21 7 21 14 2      '$ PHQ2-9    Flowsheet Row Video Visit from 04/17/2022 in [facility] Video Visit from 02/20/2022 in [facility] Video Visit from 11/19/2021 in [facility] Video Visit from 11/12/2021 in [facility] Video Visit from 10/23/2021 in [facility]  PHQ-2 Total Score '4 1 5 1 '$ 0  PHQ-9 Total Score 20 -- 12 -- --      Flowsheet Row Video Visit from 04/17/2022 in [facility] from 04/15/2022 in [facility] from 04/04/2022 in [facility] RISK CATEGORY Low Risk Error: Q3, 4, or 5 should not be populated when Q2 is No Low Risk        Assessment and Plan: Roza Creamer is a 61 year old Caucasian female, married, has a history of MDD, PTSD, anxiety, chronic pain, multiple visits to the emergency department for anxiety as well as acute exacerbation of COPD,  presented for medication management.  Patient with noncompliance to medication regimen, reports possible adverse side effects, will benefit from the following plan.  Plan MDD-unstable Start Prozac 20 mg p.o. daily with breakfast Continue CBT.  Patient reports she currently follows up with her therapist very regularly.  GAD-unstable Valium 2 mg p.o. daily as needed for severe anxiety attacks.  Patient to limit use. Start Prozac 20 mg p.o. daily with breakfast Continue CBT.   Insomnia-unstable Likely due to her COPD exacerbation. Patient was started on prednisone and antibiotics per her pulmonologist today. Patient to continue sleep hygiene techniques. She is on oxygen for COPD.  PTSD-unstable Continue CBT  Patient with multiple visits to the emergency department likely due to COPD exacerbation as well as anxiety triggered by the same, has been noncompliant with medications in general reporting side effects.  Patient provided education.  Also crisis plan discussed with patient.  Patient to go to the nearest emergency department if her symptoms worsen.  Collateral information obtained from medical records-most recent emergency department visit 04/15/2022-Dr. Marjean Donna - Ms. Waldon Merl as noted above.  Genesight testing report reviewed while making medication changes.  Follow-up in clinic in 1 week or sooner if needed.   Collaboration of Care: Collaboration of Care: Referral or follow-up with counselor/therapist AEB continue CBT and Other encouraged to follow up with her pulmonologist.  Patient/Guardian was advised Release of Information must be obtained prior to any record release in order to collaborate their care with an outside provider. Patient/Guardian was advised if they have not already done so to contact the registration department to sign all necessary forms in order for [place] to release information regarding their care.   Consent: Patient/Guardian gives verbal consent for  treatment and assignment of benefits for services provided during this visit. Patient/Guardian expressed understanding and agreed to proceed.    Ursula Alert, MD 04/17/2022, 6:38 PM

## 2022-04-23 ENCOUNTER — Inpatient Hospital Stay
Admission: EM | Admit: 2022-04-23 | Discharge: 2022-04-29 | DRG: 189 | Disposition: A | Discharge status: Home Health | Attending: Internal Medicine | Admitting: Internal Medicine

## 2022-04-23 ENCOUNTER — Encounter: Payer: Self-pay | Admitting: Internal Medicine

## 2022-04-23 ENCOUNTER — Other Ambulatory Visit: Payer: Self-pay

## 2022-04-23 ENCOUNTER — Emergency Department

## 2022-04-23 DIAGNOSIS — R000 Tachycardia, unspecified: Secondary | ICD-10-CM | POA: Diagnosis not present

## 2022-04-23 DIAGNOSIS — F418 Other specified anxiety disorders: Secondary | ICD-10-CM | POA: Diagnosis present

## 2022-04-23 DIAGNOSIS — J441 Chronic obstructive pulmonary disease with (acute) exacerbation: Secondary | ICD-10-CM | POA: Diagnosis not present

## 2022-04-23 DIAGNOSIS — T486X5A Adverse effect of antiasthmatics, initial encounter: Secondary | ICD-10-CM | POA: Diagnosis present

## 2022-04-23 DIAGNOSIS — F331 Major depressive disorder, recurrent, moderate: Secondary | ICD-10-CM | POA: Diagnosis not present

## 2022-04-23 DIAGNOSIS — J9621 Acute and chronic respiratory failure with hypoxia: Secondary | ICD-10-CM | POA: Diagnosis not present

## 2022-04-23 DIAGNOSIS — Z882 Allergy status to sulfonamides status: Secondary | ICD-10-CM | POA: Diagnosis not present

## 2022-04-23 DIAGNOSIS — G894 Chronic pain syndrome: Secondary | ICD-10-CM | POA: Diagnosis present

## 2022-04-23 DIAGNOSIS — Z923 Personal history of irradiation: Secondary | ICD-10-CM

## 2022-04-23 DIAGNOSIS — Z79899 Other long term (current) drug therapy: Secondary | ICD-10-CM | POA: Diagnosis not present

## 2022-04-23 DIAGNOSIS — F17210 Nicotine dependence, cigarettes, uncomplicated: Secondary | ICD-10-CM | POA: Diagnosis not present

## 2022-04-23 DIAGNOSIS — Z885 Allergy status to narcotic agent status: Secondary | ICD-10-CM

## 2022-04-23 DIAGNOSIS — T380X5A Adverse effect of glucocorticoids and synthetic analogues, initial encounter: Secondary | ICD-10-CM | POA: Diagnosis present

## 2022-04-23 DIAGNOSIS — Z9981 Dependence on supplemental oxygen: Secondary | ICD-10-CM

## 2022-04-23 DIAGNOSIS — E871 Hypo-osmolality and hyponatremia: Secondary | ICD-10-CM | POA: Diagnosis present

## 2022-04-23 DIAGNOSIS — F411 Generalized anxiety disorder: Secondary | ICD-10-CM | POA: Diagnosis present

## 2022-04-23 DIAGNOSIS — Z888 Allergy status to other drugs, medicaments and biological substances status: Secondary | ICD-10-CM | POA: Diagnosis not present

## 2022-04-23 DIAGNOSIS — Z853 Personal history of malignant neoplasm of breast: Secondary | ICD-10-CM

## 2022-04-23 DIAGNOSIS — Z880 Allergy status to penicillin: Secondary | ICD-10-CM

## 2022-04-23 DIAGNOSIS — R54 Age-related physical debility: Secondary | ICD-10-CM | POA: Diagnosis not present

## 2022-04-23 DIAGNOSIS — F17200 Nicotine dependence, unspecified, uncomplicated: Secondary | ICD-10-CM | POA: Diagnosis present

## 2022-04-23 DIAGNOSIS — I2510 Atherosclerotic heart disease of native coronary artery without angina pectoris: Secondary | ICD-10-CM | POA: Diagnosis present

## 2022-04-23 DIAGNOSIS — E860 Dehydration: Secondary | ICD-10-CM | POA: Diagnosis present

## 2022-04-23 LAB — CBC
HCT: 41.3 % (ref 36.0–46.0)
Hemoglobin: 14 g/dL (ref 12.0–15.0)
MCH: 30.8 pg (ref 26.0–34.0)
MCHC: 33.9 g/dL (ref 30.0–36.0)
MCV: 91 fL (ref 80.0–100.0)
Platelets: 294 10*3/uL (ref 150–400)
RBC: 4.54 MIL/uL (ref 3.87–5.11)
RDW: 13 % (ref 11.5–15.5)
WBC: 6 10*3/uL (ref 4.0–10.5)
nRBC: 0 % (ref 0.0–0.2)

## 2022-04-23 LAB — BLOOD GAS, VENOUS
Acid-Base Excess: 4 mmol/L — ABNORMAL HIGH (ref 0.0–2.0)
Bicarbonate: 29.7 mmol/L — ABNORMAL HIGH (ref 20.0–28.0)
O2 Saturation: 90.8 %
Patient temperature: 37
pCO2, Ven: 48 mmHg (ref 44–60)
pH, Ven: 7.4 (ref 7.25–7.43)
pO2, Ven: 56 mmHg — ABNORMAL HIGH (ref 32–45)

## 2022-04-23 LAB — BASIC METABOLIC PANEL
Anion gap: 8 (ref 5–15)
BUN: <5 mg/dL — ABNORMAL LOW (ref 8–23)
CO2: 25 mmol/L (ref 22–32)
Calcium: 8.7 mg/dL — ABNORMAL LOW (ref 8.9–10.3)
Chloride: 93 mmol/L — ABNORMAL LOW (ref 98–111)
Creatinine, Ser: 0.35 mg/dL — ABNORMAL LOW (ref 0.44–1.00)
GFR, Estimated: >60 mL/min (ref >60)
Glucose, Bld: 109 mg/dL — ABNORMAL HIGH (ref 70–99)
Potassium: 4.5 mmol/L (ref 3.5–5.1)
Sodium: 126 mmol/L — ABNORMAL LOW (ref 135–145)

## 2022-04-23 LAB — TROPONIN I (HIGH SENSITIVITY)
Troponin I (High Sensitivity): 5 ng/L (ref <18)
Troponin I (High Sensitivity): 7 ng/L (ref <18)

## 2022-04-23 LAB — D-DIMER, QUANTITATIVE: D-Dimer, Quant: 0.43 ug/mL-FEU (ref 0.00–0.50)

## 2022-04-23 LAB — PROCALCITONIN: Procalcitonin: <0.1 ng/mL

## 2022-04-23 IMAGING — DX DG CHEST 1V PORT
1 series · 1 of 1 positions shown · non-contrast
Comparison: [DATE]

CLINICAL DATA: Shortness of breath

EXAM:
PORTABLE CHEST 1 VIEW

[chest ap]
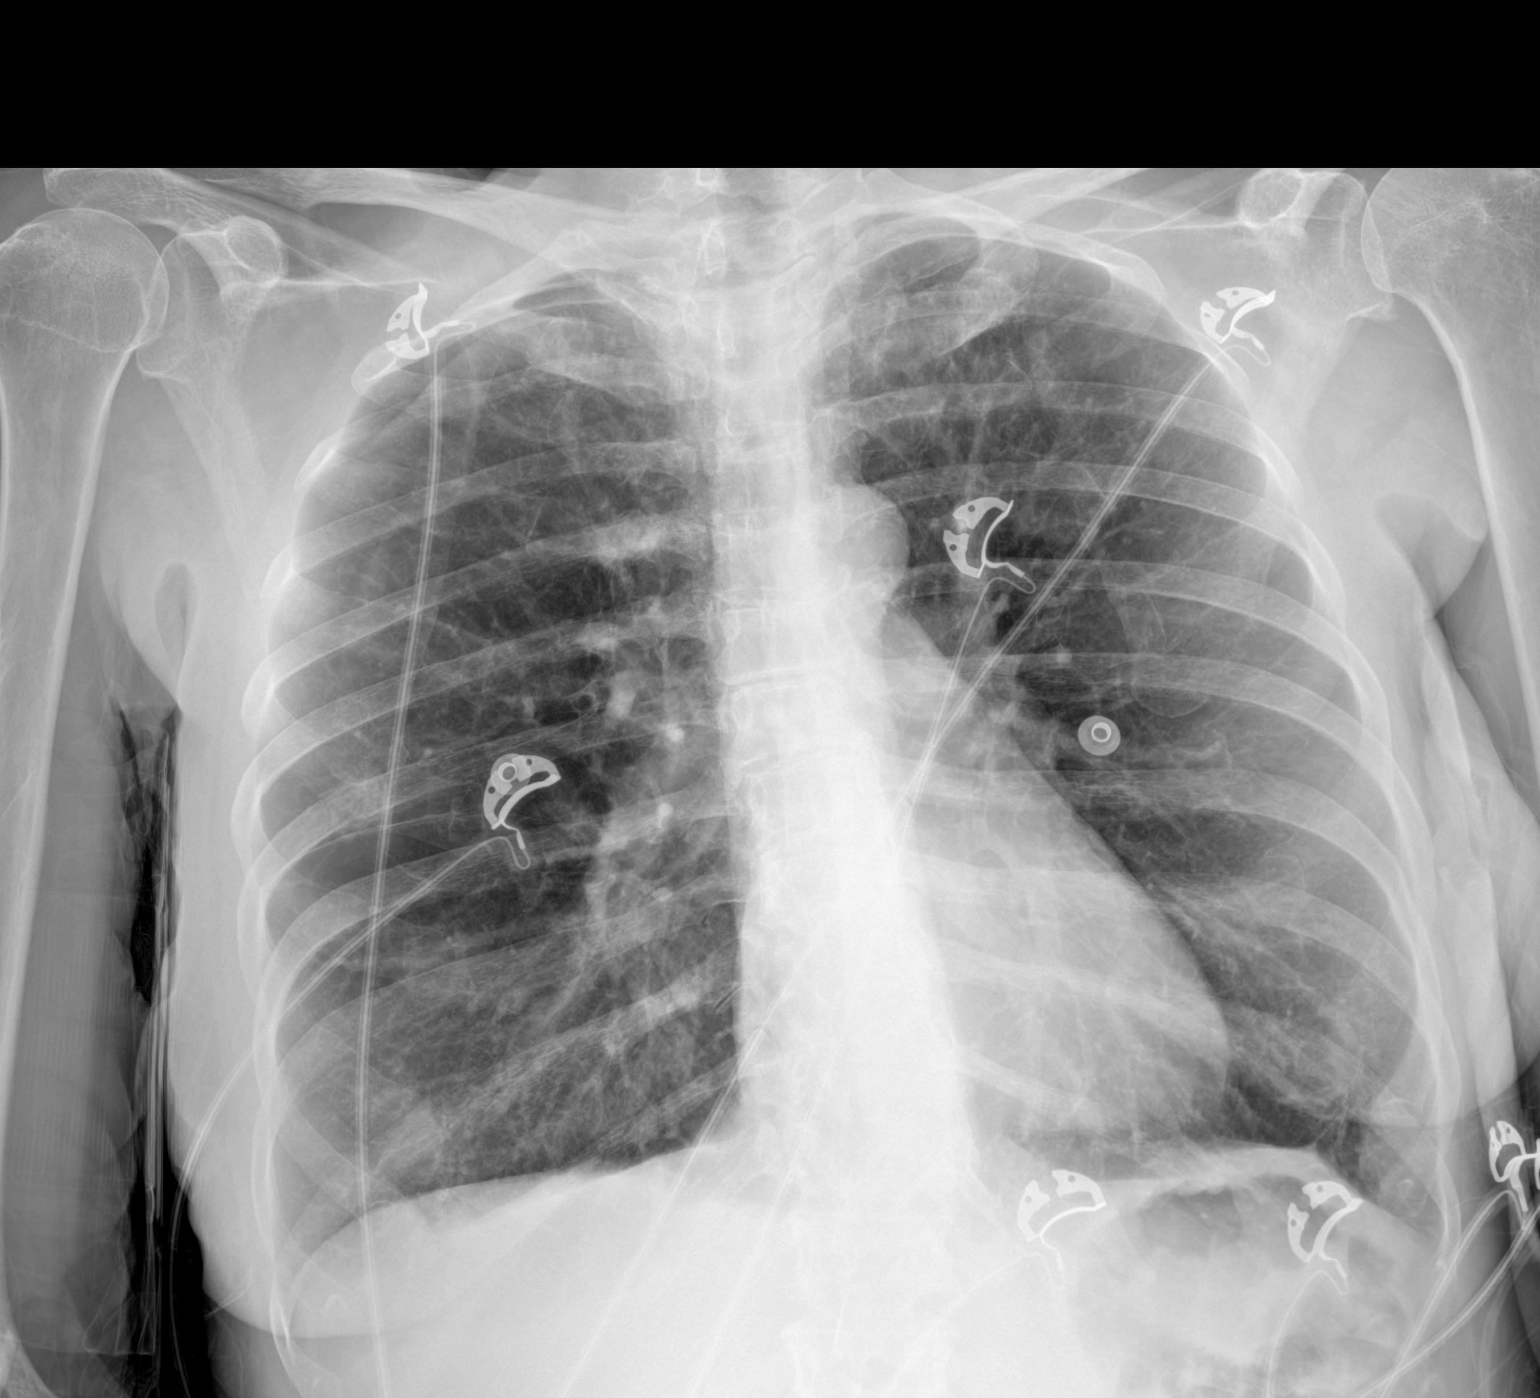

[1 of 1 positions shown; findings below may reference images not displayed]

FINDINGS: The heart size and mediastinal contours are within normal limits.
Emphysema. Diffuse bilateral interstitial pulmonary opacity. The
visualized skeletal structures are unremarkable.
IMPRESSION: Emphysema with diffuse bilateral interstitial pulmonary opacity,
which may reflect edema or atypical/viral infection. No focal
airspace opacity.

## 2022-04-23 MED ORDER — DIAZEPAM 2 MG PO TABS
2.0000 mg | ORAL_TABLET | Freq: Every day | ORAL | Status: DC | PRN
Start: 2022-04-23 — End: 2022-04-24
  Administered 2022-04-24: 2 mg via ORAL
  Filled 2022-04-23 (×2): qty 1

## 2022-04-23 MED ORDER — IPRATROPIUM-ALBUTEROL 0.5-2.5 (3) MG/3ML IN SOLN
3.0000 mL | Freq: Once | RESPIRATORY_TRACT | Status: AC
  Administered 2022-04-23: 3 mL via RESPIRATORY_TRACT
  Filled 2022-04-23: qty 3

## 2022-04-23 MED ORDER — FLUOXETINE HCL 20 MG PO CAPS
20.0000 mg | ORAL_CAPSULE | Freq: Every day | ORAL | Status: DC
  Administered 2022-04-24 – 2022-04-29 (×6): 20 mg via ORAL
  Filled 2022-04-23 (×6): qty 1

## 2022-04-23 MED ORDER — UMECLIDINIUM BROMIDE 62.5 MCG/ACT IN AEPB
1.0000 | INHALATION_SPRAY | Freq: Every day | RESPIRATORY_TRACT | Status: DC
  Administered 2022-04-24 – 2022-04-29 (×6): 1 via RESPIRATORY_TRACT
  Filled 2022-04-23: qty 7

## 2022-04-23 MED ORDER — FLUTICASONE FUROATE-VILANTEROL 100-25 MCG/ACT IN AEPB
1.0000 | INHALATION_SPRAY | Freq: Every day | RESPIRATORY_TRACT | Status: DC
  Administered 2022-04-24 – 2022-04-29 (×6): 1 via RESPIRATORY_TRACT
  Filled 2022-04-23: qty 28

## 2022-04-23 MED ORDER — CYANOCOBALAMIN 500 MCG PO TABS
500.0000 ug | ORAL_TABLET | Freq: Every day | ORAL | Status: DC
  Administered 2022-04-24 – 2022-04-29 (×6): 500 ug via ORAL
  Filled 2022-04-23 (×6): qty 1

## 2022-04-23 MED ORDER — ONDANSETRON HCL 4 MG PO TABS
4.0000 mg | ORAL_TABLET | Freq: Four times a day (QID) | ORAL | Status: DC | PRN
  Administered 2022-04-27: 4 mg via ORAL
  Filled 2022-04-23: qty 1

## 2022-04-23 MED ORDER — IPRATROPIUM-ALBUTEROL 0.5-2.5 (3) MG/3ML IN SOLN
3.0000 mL | Freq: Three times a day (TID) | RESPIRATORY_TRACT | Status: DC
Start: 2022-04-23 — End: 2022-04-29
  Administered 2022-04-23 – 2022-04-29 (×16): 3 mL via RESPIRATORY_TRACT
  Filled 2022-04-23 (×18): qty 3

## 2022-04-23 MED ORDER — METHYLPREDNISOLONE SODIUM SUCC 40 MG IJ SOLR
40.0000 mg | Freq: Two times a day (BID) | INTRAMUSCULAR | Status: AC
  Administered 2022-04-24 (×2): 40 mg via INTRAVENOUS
  Filled 2022-04-23 (×2): qty 1

## 2022-04-23 MED ORDER — METHYLPREDNISOLONE SODIUM SUCC 125 MG IJ SOLR
60.0000 mg | Freq: Once | INTRAMUSCULAR | Status: AC
  Administered 2022-04-23: 60 mg via INTRAVENOUS
  Filled 2022-04-23: qty 2

## 2022-04-23 MED ORDER — NICOTINE 21 MG/24HR TD PT24
21.0000 mg | MEDICATED_PATCH | Freq: Every day | TRANSDERMAL | Status: DC | PRN
  Administered 2022-04-28: 21 mg via TRANSDERMAL
  Filled 2022-04-23: qty 1

## 2022-04-23 MED ORDER — DIAZEPAM 2 MG PO TABS
2.0000 mg | ORAL_TABLET | Freq: Once | ORAL | Status: AC
Start: 2022-04-23 — End: 2022-04-23
  Administered 2022-04-23: 2 mg via ORAL
  Filled 2022-04-23: qty 1

## 2022-04-23 MED ORDER — DIAZEPAM 2 MG PO TABS
2.0000 mg | ORAL_TABLET | Freq: Once | ORAL | Status: AC
  Administered 2022-04-23: 2 mg via ORAL
  Filled 2022-04-23: qty 1

## 2022-04-23 MED ORDER — LORAZEPAM 2 MG/ML IJ SOLN
0.5000 mg | INTRAMUSCULAR | Status: DC | PRN
Start: 2022-04-23 — End: 2022-04-23
  Administered 2022-04-23: 0.5 mg via INTRAVENOUS
  Filled 2022-04-23: qty 1

## 2022-04-23 MED ORDER — ACETAMINOPHEN 650 MG RE SUPP: 650.0000 mg | Freq: Four times a day (QID) | RECTAL | Status: AC | PRN

## 2022-04-23 MED ORDER — LORAZEPAM 2 MG/ML IJ SOLN
0.5000 mg | INTRAMUSCULAR | Status: AC | PRN
  Administered 2022-04-24 (×2): 0.5 mg via INTRAVENOUS
  Filled 2022-04-23 (×2): qty 1

## 2022-04-23 MED ORDER — ACETAMINOPHEN 325 MG PO TABS
650.0000 mg | ORAL_TABLET | Freq: Four times a day (QID) | ORAL | Status: AC | PRN
Start: 2022-04-23 — End: 2022-04-26
  Administered 2022-04-24 – 2022-04-26 (×4): 650 mg via ORAL
  Filled 2022-04-23 (×4): qty 2

## 2022-04-23 MED ORDER — AZITHROMYCIN 500 MG PO TABS
250.0000 mg | ORAL_TABLET | ORAL | Status: DC
  Administered 2022-04-23 – 2022-04-28 (×3): 250 mg via ORAL
  Filled 2022-04-23 (×4): qty 1

## 2022-04-23 MED ORDER — ONDANSETRON HCL 4 MG/2ML IJ SOLN: 4.0000 mg | Freq: Once | INTRAMUSCULAR | Status: DC

## 2022-04-23 MED ORDER — KETOROLAC TROMETHAMINE 15 MG/ML IJ SOLN
15.0000 mg | Freq: Four times a day (QID) | INTRAMUSCULAR | Status: AC | PRN
  Administered 2022-04-23: 15 mg via INTRAVENOUS
  Filled 2022-04-23: qty 1

## 2022-04-23 MED ORDER — ONDANSETRON HCL 4 MG/2ML IJ SOLN
4.0000 mg | Freq: Four times a day (QID) | INTRAMUSCULAR | Status: DC | PRN
  Administered 2022-04-27 – 2022-04-29 (×3): 4 mg via INTRAVENOUS
  Filled 2022-04-23 (×3): qty 2

## 2022-04-23 MED ORDER — LORAZEPAM 2 MG/ML IJ SOLN: 1.0000 mg | INTRAMUSCULAR | Status: DC | PRN

## 2022-04-23 MED ORDER — LIDOCAINE 5 % EX PTCH
2.0000 | MEDICATED_PATCH | CUTANEOUS | Status: AC
  Administered 2022-04-23 – 2022-04-27 (×5): 2 via TRANSDERMAL
  Filled 2022-04-23 (×5): qty 2

## 2022-04-23 MED ORDER — POLYETHYLENE GLYCOL 3350 17 G PO PACK: 17.0000 g | PACK | Freq: Every day | ORAL | Status: DC | PRN

## 2022-04-23 MED ORDER — IPRATROPIUM-ALBUTEROL 0.5-2.5 (3) MG/3ML IN SOLN: 3.0000 mL | Freq: Three times a day (TID) | RESPIRATORY_TRACT | Status: DC

## 2022-04-23 MED ORDER — SODIUM CHLORIDE 0.9 % IV BOLUS
1000.0000 mL | Freq: Once | INTRAVENOUS | Status: AC
  Administered 2022-04-23: 1000 mL via INTRAVENOUS

## 2022-04-23 MED ORDER — ENOXAPARIN SODIUM 40 MG/0.4ML IJ SOSY
40.0000 mg | PREFILLED_SYRINGE | Freq: Every day | INTRAMUSCULAR | Status: DC
  Administered 2022-04-23 – 2022-04-28 (×6): 40 mg via SUBCUTANEOUS
  Filled 2022-04-23 (×6): qty 0.4

## 2022-04-23 NOTE — ED Notes (Signed)
RT called to inform them VBG collected

## 2022-04-23 NOTE — Assessment & Plan Note (Signed)
-   Lorazepam 0.5 mg IV every 4 hours as needed for anxiety, 1 day ordered

## 2022-04-23 NOTE — Assessment & Plan Note (Addendum)
-   Presumed secondary to ongoing tobacco use and daily exposure to secondhand smoking from her spouse.  Continue nebs and steroids.  Procalcitonin normal, so no need for antibiotics.  Patient still quite dyspneic.  Needing oxygen 2 L continuous.  Check ambulatory pulse ox in the morning.  She may have some underlying cor pulmonale.  Echocardiogram done last year noted preserved ejection fraction but indeterminate diastolic function.  BMP unremarkable.  No evidence of heart failure.

## 2022-04-23 NOTE — Plan of Care (Signed)
  Problem: Clinical Measurements: Goal: Respiratory complications will improve Outcome: Progressing   Problem: Activity: Goal: Risk for activity intolerance will decrease Outcome: Progressing   Problem: Nutrition: Goal: Adequate nutrition will be maintained Outcome: Progressing   Problem: Coping: Goal: Level of anxiety will decrease Outcome: Progressing   Problem: Pain Managment: Goal: General experience of comfort will improve Outcome: Progressing   

## 2022-04-23 NOTE — Assessment & Plan Note (Addendum)
-   Resumed home fluoxetine 20 mg daily with breakfast, diazepam 2 mg p.o. daily as needed for anxiety. Given tachycardia from albuterol nebulizers as well as IV steroids, patient has felt quite anxious during hospitalization.

## 2022-04-23 NOTE — H&P (Signed)
History and Physical   Colleen [name] QMV:[id number] DOB: Oct 13, 1961 DOA: 04/23/2022  PCP: Colleen Haggard, FNP  Outpatient Specialists: Dr. Manuella Ghazi, neurology Patient coming from: home   I have personally briefly reviewed patient's old medical records in [place].  Chief Concern: Shortness of breath  HPI: [name] is a 61 year old female with history of severe COPD, wears O2 nightly at baseline, depression with anxiety, history of breast cancer status post SBRT, who presents to the emergency department for chief concerns of shortness of breath.  Initial vitals in the emergency department showed temperature of 98, respiration rate of 31, heart rate of 108, blood pressure 143/102, SPO2 of 98% on 2 L nasal cannula.  VBG showed 7.4/48/56  Serum sodium is 126, potassium 4.5, chloride 93, bicarb 25, BUN of less than 5, serum creatinine 0.35, GFR greater than 60, nonfasting blood glucose 109, WBC 6, hemoglobin 14, platelets of 294.  High-sensitivity troponin was 5.  ED treatment: DuoNebs x3, Solu-Medrol 125 mg IV one-time dose, Valium 2 mg p.o., sodium chloride 1 L bolus.  At bedside patient is able to tell me her name, her age, she knows she is in the hospital and she knows the current calendar year.  She reports worsening shortness of breath on 04/22/2022 while she was doing something.  She reports that she has recently had some personal stressors at home.  She reports that she has access to oxygen at home however does not wear it every day she only wears it at night.  She is a current tobacco user smoking half a pack per day.  She has had lifelong exposure to secondhand smoke with her parents growing up and her husband.  She reports that she is not ready to quit any further than half a pack at this time.  She denies any recent fever, vomiting, chest pain, abdominal pain, dysuria, hematuria, swelling her legs, syncope.  She endorses nausea.  She further endorses chronic back  pain that is a 10 out of 10.  She states that it is frequently a 10 out of 10 and she is prescribed Flexeril by her pain management doctor.  Social history: She lives at home with her husband.  She is a tobacco user, after peak smoking 1 pack/day and currently smoking half pack per day.  She denies EtOH and recreational drug use.  She previously worked in housekeeping.  ROS: Constitutional: no weight change, no fever ENT/Mouth: no sore throat, no rhinorrhea Eyes: no eye pain, no vision changes Cardiovascular: no chest pain, + dyspnea,  no edema, no palpitations Respiratory: + cough, no sputum, no wheezing Gastrointestinal: + nausea, no vomiting, no diarrhea, no constipation Genitourinary: no urinary incontinence, no dysuria, no hematuria Musculoskeletal: no arthralgias, no myalgias Skin: no skin lesions, no pruritus, Neuro: + weakness, no loss of consciousness, no syncope Psych: no anxiety, no depression, + decrease appetite Heme/Lymph: no bruising, no bleeding  ED Course: Discussed with emergency medicine provider, patient requiring hospitalization for chief concerns of COPD exacerbation.  Assessment/Plan  Principal Problem:   COPD exacerbation (HCC) Active Problems:   Depression with anxiety   GAD (generalized anxiety disorder)   Hyponatremia   Tobacco use disorder   Chronic pain syndrome   Coronary artery disease   Dependence on nocturnal oxygen therapy   MDD (major depressive disorder), recurrent episode, moderate (HCC)   Assessment and Plan:  * COPD exacerbation ([place]) - Presumed secondary to ongoing tobacco use and daily exposure to secondhand smoking from her  spouse - Scheduled DuoNebs 3 times daily before meals and at bedtime ordered, 4 doses ordered - Solu-Medrol 40 mg IV twice daily, 2 doses ordered - Check procalcitonin  Depression with anxiety - Resumed home fluoxetine 20 mg daily with breakfast, diazepam 2 mg p.o. daily as needed for anxiety - I have also  ordered lorazepam injection 0.5 mg IV every 4 hours as needed for anxiety that is refractory to home p.o. anxiety dosing  Hyponatremia - Suspect secondary to poor diet  - Checking urine osmolality, urine sodium level - Dietitian has been consulted - BMP in a.m.  GAD (generalized anxiety disorder) - Lorazepam 0.5 mg IV every 4 hours as needed for anxiety, 1 day ordered  Tobacco use disorder - Nicotine patch as needed ordered - Patient is not ready to quit  MDD (major depressive disorder), recurrent episode, moderate (HCC) - Fluoxetine 20 mg daily with breakfast resumed  Chronic pain syndrome - Chronic back pain, patient stating 10 out of 10, on discussion with patient this is persistent and unchanged.  She states that she states that a 10 out of 10. - Patient also endorses that she would like to avoid opioid medication as it gives her anaphylactic shock and she has a sister who was a drug user therefore she is fearful of opioid medication - I counseled patient on alternating between an NSAID and Tylenol at home and using lidocaine patch - I have ordered Tylenol, lidocaine patch, and as needed Toradol for 1 day - Patient is agreeable with this treatment plan  Chart reviewed.   DVT prophylaxis: Enoxaparin Code Status: Full code Diet: Heart healthy Family Communication: No Disposition Plan: Pending clinical course; anticipate discharge in the a.m. as patient does not appear to be in acute distress, no accessory respiratory muscle used with nasal cannula in place Consults called: None at this time Admission status: Telemetry medical, observation  Past Medical History:  Diagnosis Date   Anxiety    Asthma    Breast cancer ([place]) 2004   left breast   Chronic back pain    COPD (chronic obstructive pulmonary disease) ([place])    Depression    History of kidney surgery    Personal history of radiation therapy    PTSD (post-traumatic stress disorder)    Past Surgical History:   Procedure Laterality Date   BREAST BIOPSY Left 2004   positive   BREAST BIOPSY Right    neg   BREAST LUMPECTOMY Left 2004   positive   LEFT HEART CATH AND CORONARY ANGIOGRAPHY N/A 11/06/2021   Procedure: LEFT HEART CATH AND CORONARY ANGIOGRAPHY;  Surgeon: Corey Skains, MD;  Location: [facility];  Service: Cardiovascular;  Laterality: N/A;   MASTECTOMY Left    MASTECTOMY     NEPHRECTOMY Left    TUBAL LIGATION     Social History:  reports that she has been smoking cigarettes. She has been smoking an average of .5 packs per day. She has never been exposed to tobacco smoke. She has never used smokeless tobacco. She reports that she does not drink alcohol and does not use drugs.  Allergies  Allergen Reactions   Desvenlafaxine Anaphylaxis   Morphine And Related Anaphylaxis   Penicillins Anaphylaxis    Has patient had a PCN reaction causing immediate rash, facial/tongue/throat swelling, SOB or lightheadedness with hypotension: Yes Has patient had a PCN reaction causing severe rash involving mucus membranes or skin necrosis: No Has patient had a PCN reaction that required hospitalization: Unknown  Has patient had a PCN reaction occurring within the last 10 years: No If all of the above answers are "NO", then may proceed with Cephalosporin use.    Prazosin Other (See Comments)   Tamoxifen Anaphylaxis   Trazodone Anaphylaxis   Clonazepam    Codeine Swelling   Duloxetine Itching   Gabapentin Hives    Rash and swelling.    Hydroxyzine Itching   Lorazepam     Patient feels this medications makes her to sedated and wants to avoid   Paroxetine Hcl Hives   Sulfa Antibiotics Itching   Cephalexin Rash   Family History  Problem Relation Age of Onset   Breast cancer Cousin    Breast cancer Cousin    Bipolar disorder Daughter    Drug abuse Daughter    Alcohol abuse Maternal Grandfather    Family history: Family history reviewed and not pertinent.  Prior to Admission  medications   Medication Sig Start Date End Date Taking? Authorizing Provider  albuterol (VENTOLIN HFA) 108 (90 Base) MCG/ACT inhaler Inhale 1-2 puffs into the lungs every 4 (four) hours as needed for cough or wheezing.   Yes [provider]  azithromycin (ZITHROMAX) 250 MG tablet Take 1 tablet by mouth. Every Monday, Wednesday and friday 04/17/22  Yes [provider]  cholecalciferol (VITAMIN D3) 25 MCG (1000 UNIT) tablet Take 1,000 Units by mouth daily.   Yes [provider]  cyclobenzaprine (FLEXERIL) 10 MG tablet Take 1 tablet (10 mg total) by mouth 3 (three) times daily as needed for muscle spasms. 03/06/22  Yes Gillis Santa, MD  denosumab (PROLIA) 60 MG/ML SOSY injection Inject 60 mg into the skin every 6 (six) months.   Yes [provider]  diazepam (VALIUM) 2 MG tablet Take 1 tablet (2 mg total) by mouth daily as needed for anxiety. For severe panic symptoms 04/01/22 05/01/22 Yes Ursula Alert, MD  FLUoxetine (PROZAC) 20 MG tablet Take 1 tablet (20 mg total) by mouth daily with breakfast. 04/17/22  Yes Eappen, Saramma, MD  Fluticasone-Umeclidin-Vilant (TRELEGY ELLIPTA) 100-62.5-25 MCG/INH AEPB Inhale 1 puff into the lungs daily.   Yes [provider]  ipratropium (ATROVENT) 0.02 % nebulizer solution Take 0.5 mg by nebulization 4 (four) times daily.   Yes [provider]  predniSONE (DELTASONE) 5 MG tablet Take 5 mg by mouth daily. 04/17/22  Yes [provider]  vitamin B-12 (CYANOCOBALAMIN) 500 MCG tablet Take 500 mcg by mouth daily.   Yes [provider]  EPINEPHrine 0.3 mg/0.3 mL IJ SOAJ injection Inject 0.3 mg into the muscle as needed for anaphylaxis.    [provider]   Physical Exam: Vitals:   04/23/22 1300 04/23/22 1330 04/23/22 1445 04/23/22 1543  BP: (!) 134/91 (!) 137/93  136/88  Pulse: 95 99  97  Resp: (!) 25   19  Temp:   98.1 F (36.7 C) 98.1 F (36.7 C)  TempSrc:   Oral Oral  SpO2: 98% 97%  92%    Constitutional: appears older than chronological age, NAD, calm, comfortable Eyes: PERRL, lids and conjunctivae normal ENMT: Mucous membranes are moist. Posterior pharynx clear of any exudate or lesions. Age-appropriate dentition. Hearing appropriate Neck: normal, supple, no masses, no thyromegaly Respiratory: Generalized decreased lung sounds, generalized wheezing, normal respiratory effort. No accessory muscle use.  2 L nasal cannula in place Cardiovascular: Regular rate and rhythm, no murmurs / rubs / gallops. No extremity edema. 2+ pedal pulses. No carotid bruits.  Abdomen: no tenderness, no masses  palpated, no hepatosplenomegaly. Bowel sounds positive.  Musculoskeletal: no clubbing / cyanosis. No joint deformity upper and lower extremities. Good ROM, no contractures, no atrophy. Normal muscle tone.  Skin: no rashes, lesions, ulcers. No induration Neurologic: Sensation intact. Strength 5/5 in all 4.  Psychiatric: Normal judgment and insight. Alert and oriented x 3. Normal mood.   EKG: independently reviewed, showing tachycardia with rate of 107, QTc 451  Chest x-ray on Admission: I personally reviewed and I agree with radiologist reading as below.  DG Chest Portable 1 View  Result Date: 04/23/2022 CLINICAL DATA:  Shortness of breath EXAM: PORTABLE CHEST 1 VIEW COMPARISON:  04/15/2022 FINDINGS: The heart size and mediastinal contours are within normal limits. Emphysema. Diffuse bilateral interstitial pulmonary opacity. The visualized skeletal structures are unremarkable. IMPRESSION: Emphysema with diffuse bilateral interstitial pulmonary opacity, which may reflect edema or atypical/viral infection. No focal airspace opacity. Electronically Signed   By: Delanna Ahmadi M.D.   On: 04/23/2022 10:31    Labs on Admission: I have personally reviewed following labs  CBC: Recent Labs  Lab 04/23/22 1035  WBC 6.0  HGB 14.0  HCT 41.3  MCV 91.0  PLT 099   Basic Metabolic Panel: Recent Labs   Lab 04/23/22 1035  NA 126*  K 4.5  CL 93*  CO2 25  GLUCOSE 109*  BUN <5*  CREATININE 0.35*  CALCIUM 8.7*   GFR: Estimated Creatinine Clearance: 53 mL/min (A) (by C-G formula based on SCr of 0.35 mg/dL (L)).  Urine analysis:    Component Value Date/Time   COLORURINE STRAW (A) 03/20/2022 1149   APPEARANCEUR CLEAR (A) 03/20/2022 1149   LABSPEC 1.005 03/20/2022 1149   PHURINE 7.0 03/20/2022 1149   GLUCOSEU NEGATIVE 03/20/2022 1149   HGBUR NEGATIVE 03/20/2022 [place] 03/20/2022 1149   KETONESUR NEGATIVE 03/20/2022 1149   PROTEINUR NEGATIVE 03/20/2022 1149   NITRITE NEGATIVE 03/20/2022 [place] 03/20/2022 1149   Dr. Tobie Poet [facility]  If 7PM-7AM, please contact overnight-coverage provider If 7AM-7PM, please contact day coverage provider [email or web address]  04/23/2022, 6:22 PM

## 2022-04-23 NOTE — Assessment & Plan Note (Signed)
-   Fluoxetine 20 mg daily with breakfast resumed

## 2022-04-23 NOTE — Assessment & Plan Note (Addendum)
-   Nicotine patch as needed ordered - Patient is not ready to quit

## 2022-04-23 NOTE — Hospital Course (Addendum)
61 year old female with past medical history of severe COPD on oxygen at night plus history of breast cancer, anxiety and depression presented to the emergency room on 6/14 with complaints of shortness of breath for several days.  Admitted for COPD exacerbation.

## 2022-04-23 NOTE — ED Notes (Signed)
Informed RN bed assigned 

## 2022-04-23 NOTE — Assessment & Plan Note (Addendum)
-   Chronic back pain, patient stating 10 out of 10, on discussion with patient this is persistent and unchanged.  She states that she states that a 10 out of 10. - Patient also endorses that she would like to avoid opioid medication as it gives her anaphylactic shock and she has a sister who was a drug user therefore she is fearful of opioid medication - I counseled patient on alternating between an NSAID and Tylenol at home and using lidocaine patch, and providing a lidocaine patch upon discharge.

## 2022-04-23 NOTE — ED Triage Notes (Signed)
Pt EMS from home. Husband reports not eating for 2 days.  Feels nauseated but unable to vomit.  Only gatorade and water.  Pt shob and 10/10 CP on the left.  Pt usually wears 2L O2 and is requiring 3L now.  Pt RR in the 55s.

## 2022-04-23 NOTE — ED Provider Notes (Addendum)
[facility] Provider Note    Event Date/Time   First MD Initiated Contact with Patient 04/23/22 1010     (approximate)   History   Chest Pain   HPI  Paulene Tayag is a 61 y.o. female   extensive history of anxiety, asthma, COPD and breast cancer remotely in 2004 presents to the ER for worsening shortness of breath.  Feels very anxious.  States she is having trouble catching her breath.  Wears home oxygen 2l at night, none during the day.  Also having left-sided chest pain with the shortness of breath.  Hurts to take deep inspiration.  Denies any nausea or vomiting.      Physical Exam   Triage Vital Signs: ED Triage Vitals  Enc Vitals Group     BP 04/23/22 1000 (!) 143/102     Pulse Rate 04/23/22 1000 (!) 108     Resp 04/23/22 1000 (!) 31     Temp 04/23/22 1001 98 F (36.7 C)     Temp Source 04/23/22 1001 Oral     SpO2 04/23/22 1000 98 %     Weight --      Height --      Head Circumference --      Peak Flow --      Pain Score --      Pain Loc --      Pain Edu? --      Excl. in [place]? --     Most recent vital signs: Vitals:   04/23/22 1200 04/23/22 1300  BP: 123/87 (!) 134/91  Pulse: 94 95  Resp:  (!) 25  Temp:    SpO2: 96% 98%     Constitutional: Alert, frail-appearing chronically ill-appearing mild respiratory distress Eyes: Conjunctivae are normal.  Head: Atraumatic. Nose: No congestion/rhinnorhea. Mouth/Throat: Mucous membranes are moist.   Neck: Painless ROM.  Cardiovascular:   Good peripheral circulation.  Tachycardic no murmurs gallops or rubs Respiratory: Tachypnea with prolonged expiratory phase with diffuse wheezing on exam. Gastrointestinal: Soft and nontender.  Musculoskeletal:  no deformity Neurologic:  MAE spontaneously. No gross focal neurologic deficits are appreciated.  Skin:  Skin is warm, dry and intact. No rash noted. Psychiatric: Mood and affect are normal. Speech and behavior are normal.    ED Results /  Procedures / Treatments   Labs (all labs ordered are listed, but only abnormal results are displayed) Labs Reviewed  BASIC METABOLIC PANEL - Abnormal; Notable for the following components:      Result Value   Sodium 126 (*)    Chloride 93 (*)    Glucose, Bld 109 (*)    BUN <5 (*)    Creatinine, Ser 0.35 (*)    Calcium 8.7 (*)    All other components within normal limits  BLOOD GAS, VENOUS - Abnormal; Notable for the following components:   pO2, Ven 56 (*)    Bicarbonate 29.7 (*)    Acid-Base Excess 4.0 (*)    All other components within normal limits  CBC  D-DIMER, QUANTITATIVE  TROPONIN I (HIGH SENSITIVITY)  TROPONIN I (HIGH SENSITIVITY)     EKG  ED ECG REPORT I, Merlyn Lot, the attending physician, personally viewed and interpreted this ECG.   Date: 04/23/2022  EKG Time: 9:57  Rate: 100  Rhythm: sinus  Axis: normal  Intervals: normal  ST&T Change: no stemi, no depressions    RADIOLOGY Please see ED Course for my review and interpretation.  I personally reviewed all  radiographic images ordered to evaluate for the above acute complaints and reviewed radiology reports and findings.  These findings were personally discussed with the patient.  Please see medical record for radiology report.    PROCEDURES:  Critical Care performed: Yes, see critical care procedure note(s)  .Critical Care  Performed by: Merlyn Lot, MD Authorized by: Merlyn Lot, MD   Critical care provider statement:    Critical care time (minutes):  35   Critical care was necessary to treat or prevent imminent or life-threatening deterioration of the following conditions:  Respiratory failure   Critical care was time spent personally by me on the following activities:  Ordering and performing treatments and interventions, ordering and review of laboratory studies, ordering and review of radiographic studies, pulse oximetry, re-evaluation of patient's condition, review of old  charts, obtaining history from patient or surrogate, examination of patient, evaluation of patient's response to treatment, discussions with primary provider, discussions with consultants and development of treatment plan with patient or surrogate    MEDICATIONS ORDERED IN ED: Medications  methylPREDNISolone sodium succinate (SOLU-MEDROL) 125 mg/2 mL injection 60 mg (has no administration in time range)  ipratropium-albuterol (DUONEB) 0.5-2.5 (3) MG/3ML nebulizer solution 3 mL (3 mLs Nebulization Given 04/23/22 1018)  sodium chloride 0.9 % bolus 1,000 mL (0 mLs Intravenous Stopped 04/23/22 1145)  diazepam (VALIUM) tablet 2 mg (2 mg Oral Given 04/23/22 1024)  ipratropium-albuterol (DUONEB) 0.5-2.5 (3) MG/3ML nebulizer solution 3 mL (3 mLs Nebulization Given 04/23/22 1255)  diazepam (VALIUM) tablet 2 mg (2 mg Oral Given 04/23/22 1255)     IMPRESSION / MDM / [place] / ED COURSE  I reviewed the triage vital signs and the nursing notes.                              Differential diagnosis includes, but is not limited to, Asthma, copd, CHF, pna, ptx, malignancy, Pe, anemia  Patient presented to the ER for evaluation shortness of breath.  Patient very anxious appearing tachypneic wheezing on exam concerning for COPD exacerbation.  This presenting complaint could reflect a potentially life-threatening illness therefore the patient will be placed on continuous pulse oximetry and telemetry for monitoring.  Laboratory evaluation will be sent to evaluate for the above complaints.  Will give nebulizer and valium.   Clinical Course as of 04/23/22 1326  Wed Apr 23, 2022  1048 VBG is normal.   [PR]  1318 Patient with significant exertional dyspnea feels like she is about to pass out with ambulation.  Do think there is a large component of anxiety but does have significant wheezing on exam and probable underlying COPD exacerbation.  Given her age frailty with evidence of acute hyponatremia receiving  IV fluids we discussed case in consultation with hospitalist for admission. [PR]    Clinical Course User Index [PR] Merlyn Lot, MD     FINAL CLINICAL IMPRESSION(S) / ED DIAGNOSES   Final diagnoses:  Acute on chronic respiratory failure with hypoxia ([place])  Hyponatremia  COPD exacerbation ([place])     Rx / DC Orders   ED Discharge Orders     None        Note:  This document was prepared using Dragon voice recognition software and may include unintentional dictation errors.    Merlyn Lot, MD 04/23/22 1326    Merlyn Lot, MD 04/23/22 1326

## 2022-04-23 NOTE — Assessment & Plan Note (Addendum)
-   Suspect secondary to dehydration.  Sodium on admission at 126.  Patient's sodiums from previous have ranged from 126-133.  Recheck in the morning.

## 2022-04-23 NOTE — Progress Notes (Signed)
Admission profile updated. ?

## 2022-04-24 DIAGNOSIS — Z880 Allergy status to penicillin: Secondary | ICD-10-CM | POA: Diagnosis not present

## 2022-04-24 DIAGNOSIS — E860 Dehydration: Secondary | ICD-10-CM | POA: Diagnosis present

## 2022-04-24 DIAGNOSIS — Z885 Allergy status to narcotic agent status: Secondary | ICD-10-CM | POA: Diagnosis not present

## 2022-04-24 DIAGNOSIS — Z79899 Other long term (current) drug therapy: Secondary | ICD-10-CM | POA: Diagnosis not present

## 2022-04-24 DIAGNOSIS — E871 Hypo-osmolality and hyponatremia: Secondary | ICD-10-CM | POA: Diagnosis present

## 2022-04-24 DIAGNOSIS — F418 Other specified anxiety disorders: Secondary | ICD-10-CM | POA: Diagnosis not present

## 2022-04-24 DIAGNOSIS — F411 Generalized anxiety disorder: Secondary | ICD-10-CM | POA: Diagnosis present

## 2022-04-24 DIAGNOSIS — F17200 Nicotine dependence, unspecified, uncomplicated: Secondary | ICD-10-CM | POA: Diagnosis not present

## 2022-04-24 DIAGNOSIS — J9621 Acute and chronic respiratory failure with hypoxia: Secondary | ICD-10-CM

## 2022-04-24 DIAGNOSIS — Z882 Allergy status to sulfonamides status: Secondary | ICD-10-CM | POA: Diagnosis not present

## 2022-04-24 DIAGNOSIS — J441 Chronic obstructive pulmonary disease with (acute) exacerbation: Secondary | ICD-10-CM | POA: Diagnosis present

## 2022-04-24 DIAGNOSIS — I2510 Atherosclerotic heart disease of native coronary artery without angina pectoris: Secondary | ICD-10-CM | POA: Diagnosis present

## 2022-04-24 DIAGNOSIS — G894 Chronic pain syndrome: Secondary | ICD-10-CM | POA: Diagnosis present

## 2022-04-24 DIAGNOSIS — Z923 Personal history of irradiation: Secondary | ICD-10-CM | POA: Diagnosis not present

## 2022-04-24 DIAGNOSIS — Z853 Personal history of malignant neoplasm of breast: Secondary | ICD-10-CM | POA: Diagnosis not present

## 2022-04-24 DIAGNOSIS — R000 Tachycardia, unspecified: Secondary | ICD-10-CM | POA: Diagnosis present

## 2022-04-24 DIAGNOSIS — Z9981 Dependence on supplemental oxygen: Secondary | ICD-10-CM | POA: Diagnosis not present

## 2022-04-24 DIAGNOSIS — F17210 Nicotine dependence, cigarettes, uncomplicated: Secondary | ICD-10-CM | POA: Diagnosis present

## 2022-04-24 DIAGNOSIS — T486X5A Adverse effect of antiasthmatics, initial encounter: Secondary | ICD-10-CM | POA: Diagnosis present

## 2022-04-24 DIAGNOSIS — T380X5A Adverse effect of glucocorticoids and synthetic analogues, initial encounter: Secondary | ICD-10-CM | POA: Diagnosis present

## 2022-04-24 DIAGNOSIS — R54 Age-related physical debility: Secondary | ICD-10-CM | POA: Diagnosis present

## 2022-04-24 DIAGNOSIS — F331 Major depressive disorder, recurrent, moderate: Secondary | ICD-10-CM | POA: Diagnosis present

## 2022-04-24 DIAGNOSIS — Z888 Allergy status to other drugs, medicaments and biological substances status: Secondary | ICD-10-CM | POA: Diagnosis not present

## 2022-04-24 LAB — OSMOLALITY, URINE: Osmolality, Ur: 458 mOsm/kg (ref 300–900)

## 2022-04-24 LAB — BASIC METABOLIC PANEL
Anion gap: 6 (ref 5–15)
BUN: 12 mg/dL (ref 8–23)
CO2: 25 mmol/L (ref 22–32)
Calcium: 8.6 mg/dL — ABNORMAL LOW (ref 8.9–10.3)
Chloride: 98 mmol/L (ref 98–111)
Creatinine, Ser: 0.45 mg/dL (ref 0.44–1.00)
GFR, Estimated: >60 mL/min (ref >60)
Glucose, Bld: 106 mg/dL — ABNORMAL HIGH (ref 70–99)
Potassium: 4.3 mmol/L (ref 3.5–5.1)
Sodium: 129 mmol/L — ABNORMAL LOW (ref 135–145)

## 2022-04-24 LAB — CBC
HCT: 36.6 % (ref 36.0–46.0)
Hemoglobin: 12 g/dL (ref 12.0–15.0)
MCH: 29.9 pg (ref 26.0–34.0)
MCHC: 32.8 g/dL (ref 30.0–36.0)
MCV: 91.3 fL (ref 80.0–100.0)
Platelets: 255 10*3/uL (ref 150–400)
RBC: 4.01 MIL/uL (ref 3.87–5.11)
RDW: 13 % (ref 11.5–15.5)
WBC: 6.6 10*3/uL (ref 4.0–10.5)
nRBC: 0 % (ref 0.0–0.2)

## 2022-04-24 LAB — BRAIN NATRIURETIC PEPTIDE: B Natriuretic Peptide: 29.4 pg/mL (ref 0.0–100.0)

## 2022-04-24 LAB — SODIUM, URINE, RANDOM: Sodium, Ur: 63 mmol/L

## 2022-04-24 MED ORDER — METHYLPREDNISOLONE SODIUM SUCC 40 MG IJ SOLR
INTRAMUSCULAR | Status: AC
  Filled 2022-04-24: qty 1

## 2022-04-24 MED ORDER — GUAIFENESIN-DM 100-10 MG/5ML PO SYRP
5.0000 mL | ORAL_SOLUTION | ORAL | Status: DC | PRN
  Administered 2022-04-24 – 2022-04-28 (×9): 5 mL via ORAL
  Filled 2022-04-24 (×9): qty 10

## 2022-04-24 MED ORDER — LORAZEPAM 2 MG/ML IJ SOLN
0.5000 mg | INTRAMUSCULAR | Status: DC | PRN
Start: 2022-04-24 — End: 2022-04-27
  Administered 2022-04-24 – 2022-04-27 (×10): 0.5 mg via INTRAVENOUS
  Filled 2022-04-24 (×10): qty 1

## 2022-04-24 MED ORDER — METHYLPREDNISOLONE SODIUM SUCC 125 MG IJ SOLR
80.0000 mg | INTRAMUSCULAR | Status: DC
  Administered 2022-04-25: 80 mg via INTRAVENOUS
  Filled 2022-04-24 (×2): qty 2

## 2022-04-24 NOTE — Progress Notes (Signed)
Nutrition Brief Note  Patient identified on the Malnutrition Screening Tool (MST) Report  Wt Readings from Last 15 Encounters:  04/15/22 54.4 kg  04/04/22 56.2 kg  04/01/22 51.7 kg  03/30/22 52.7 kg  03/20/22 56.2 kg  03/09/22 52.6 kg  02/22/22 54.8 kg  02/12/22 54.8 kg  02/10/22 54.9 kg  01/28/22 54.9 kg  01/15/22 54.9 kg  01/07/22 56.7 kg  12/13/21 55.8 kg  12/04/21 60 kg  11/06/21 59.9 kg   Pt with history of severe COPD, wears O2 nightly at baseline, depression with anxiety, history of breast cancer status post SBRT, who presents for chief concerns of shortness of breath.  Pt admitted with COPD exacerbation.   Reviewed I/O's: -400 ml x 24 hours  Medications reviewed and include vitamin B-12 and solu-medrol.   Labs reviewed: Na: 129, CBGS: 101.   Current diet order is regular (liberalized from [employer] Healthy for widest variety of meal selections), patient is consuming approximately 100% of meals at this time. Labs and medications reviewed.   No nutrition interventions warranted at this time. If nutrition issues arise, please consult RD.   Loistine Chance, RD, LDN, [place] Registered Dietitian II Certified Diabetes Care and Education Specialist Please refer to [facility] for RD and/or RD on-call/weekend/after hours pager

## 2022-04-24 NOTE — Progress Notes (Signed)
SATURATION QUALIFICATIONS: (This note is used to comply with regulatory documentation for home oxygen)  Patient Saturations on Room Air at Rest = n/a pt on 2L chronic  Patient Saturations on Room Air while Ambulating = n/a pt on 2L chronic  Patient Saturations on 3  Liters of oxygen while Ambulating = 90%  Please briefly explain why patient needs home oxygen:

## 2022-04-24 NOTE — Assessment & Plan Note (Addendum)
Normally on 2 L only at night.  Over next few days, improved and patient able to be weaned off of oxygen exams.  Checking ambulatory pulse ox prior to discharge.

## 2022-04-24 NOTE — Progress Notes (Signed)
Cross Cover Oral diazepam discontinued and ativan 0.5 mg IV every 4 hours ordered as directed by attendings note for patients uncontrolled anxiety symptoms with home regimen

## 2022-04-24 NOTE — Progress Notes (Signed)
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  Admitted for COPD exacerbation.   Assessment and Plan: Assessment and Plan: * COPD exacerbation ([place]) - Presumed secondary to ongoing tobacco use and daily exposure to secondhand smoking from her spouse.  Continue nebs and steroids.  Procalcitonin normal, so no need for antibiotics.  Patient still quite dyspneic.  Needing oxygen 2 L continuous.  Check ambulatory pulse ox in the morning.  She may have some underlying cor pulmonale.  Echocardiogram done last year noted preserved ejection fraction but indeterminate diastolic function.  Will check BNP.  Acute on chronic respiratory failure with hypoxia (HCC) Normally on 2 L only at night.  Here on 2 L to 3 L continuous.  We will try to wean down as she improves.  Depression with anxiety - Resumed home fluoxetine 20 mg daily with breakfast, diazepam 2 mg p.o. daily as needed for anxiety - I have also ordered lorazepam injection 0.5 mg IV every 4 hours as needed for anxiety that is refractory to home p.o. anxiety dosing  Hyponatremia - Suspect secondary to dehydration.  Sodium on admission at 126.  1 week ago, sodium at 132.  Follow-up sodium today at 129.  GAD (generalized anxiety disorder) - Lorazepam 0.5 mg IV every 4 hours as needed for anxiety, 1 day ordered  Tobacco use disorder - Nicotine patch as needed ordered - Patient is not ready to quit  Chronic pain syndrome - Chronic back pain, patient stating 10 out of 10, on discussion with patient this is persistent and unchanged.  She states that she states that a 10 out of 10. -  Patient also endorses that she would like to avoid opioid medication as it gives her anaphylactic shock and she has a sister who was a drug user therefore she is fearful of opioid medication - I counseled patient on alternating between an NSAID and Tylenol at home and using lidocaine patch - I have ordered Tylenol, lidocaine patch, and as needed Toradol for 1 day - Patient is agreeable with this treatment plan  MDD (major depressive disorder), recurrent episode, moderate (HCC) - Fluoxetine 20 mg daily with breakfast resumed       There is no height or weight on file to calculate BMI.        Consultants: None  Procedures: None  Antimicrobials: None  Code Status: Full code   Subjective: Breathing a little easier, but still feels quite short of breath  Objective: Vital signs were reviewed and unremarkable. Vitals:   04/24/22 1426 04/24/22 1546  BP: 119/73 114/84  Pulse: (!) 101 96  Resp: 20 18  Temp: 98.5 F (36.9 C) 98.1 F (36.7 C)  SpO2: 98% 98%    Intake/Output Summary (Last 24 hours) at 04/24/2022 1634 Last data filed at 04/24/2022 1401 Gross per 24 hour  Intake 480 ml  Output 400 ml  Net 80 ml   There were no vitals filed for this visit. There is no height or weight on file to calculate BMI.  Exam:  General: Alert and oriented x3, mildly anxious HEENT: Normocephalic, atraumatic, mucous membranes are moist Cardiovascular: Regular rate and  rhythm, borderline tachycardia Respiratory: Decreased breath sounds throughout, good airflow Abdomen: Soft, nontender, nondistended, positive bowel sounds Musculoskeletal: No clubbing or cyanosis or edema Skin: No skin breaks, tears or lesions Psychiatry: Appropriate, no evidence of psychoses Neurology: No focal deficits  Data Reviewed: Noted normal procalcitonin.  Improving sodium level  Disposition:  Status is: Observation     Anticipated discharge date: 6/16  Remaining issues to be resolved so that  patient can be discharged: Improvement in hypoxia   Family Communication: Declined for me to call family DVT Prophylaxis: enoxaparin (LOVENOX) injection 40 mg Start: 04/23/22 2200 Place TED hose Start: 04/23/22 1344    Author: Annita Brod ,MD 04/24/2022 4:34 PM  To reach On-call, see care teams to locate the attending and reach out via [email or web address]. Between 7PM-7AM, please contact night-coverage If you still have difficulty reaching the attending provider, please page the [facility] (Director on Call) for [facility] on amion for assistance.

## 2022-04-25 DIAGNOSIS — F418 Other specified anxiety disorders: Secondary | ICD-10-CM | POA: Diagnosis not present

## 2022-04-25 DIAGNOSIS — J441 Chronic obstructive pulmonary disease with (acute) exacerbation: Secondary | ICD-10-CM | POA: Diagnosis not present

## 2022-04-25 DIAGNOSIS — J9621 Acute and chronic respiratory failure with hypoxia: Secondary | ICD-10-CM | POA: Diagnosis not present

## 2022-04-25 LAB — BASIC METABOLIC PANEL
Anion gap: 7 (ref 5–15)
BUN: 9 mg/dL (ref 8–23)
CO2: 28 mmol/L (ref 22–32)
Calcium: 9 mg/dL (ref 8.9–10.3)
Chloride: 93 mmol/L — ABNORMAL LOW (ref 98–111)
Creatinine, Ser: 0.33 mg/dL — ABNORMAL LOW (ref 0.44–1.00)
GFR, Estimated: >60 mL/min (ref >60)
Glucose, Bld: 108 mg/dL — ABNORMAL HIGH (ref 70–99)
Potassium: 4.6 mmol/L (ref 3.5–5.1)
Sodium: 128 mmol/L — ABNORMAL LOW (ref 135–145)

## 2022-04-25 MED ORDER — ZOLPIDEM TARTRATE 5 MG PO TABS: 5.0000 mg | ORAL_TABLET | Freq: Every evening | ORAL | Status: DC | PRN

## 2022-04-25 MED ORDER — LIDOCAINE 5 % EX PTCH: 1.0000 | MEDICATED_PATCH | CUTANEOUS | 0 refills | Status: DC

## 2022-04-25 MED ORDER — PREDNISONE 50 MG PO TABS
60.0000 mg | ORAL_TABLET | Freq: Every day | ORAL | Status: DC
  Administered 2022-04-26: 60 mg via ORAL
  Filled 2022-04-25: qty 1

## 2022-04-25 MED ORDER — PREDNISONE 10 MG PO TABS: ORAL_TABLET | ORAL | 0 refills | Status: DC

## 2022-04-25 MED ORDER — KETOROLAC TROMETHAMINE 15 MG/ML IJ SOLN
15.0000 mg | Freq: Once | INTRAMUSCULAR | Status: AC
  Administered 2022-04-25: 15 mg via INTRAVENOUS
  Filled 2022-04-25: qty 1

## 2022-04-25 NOTE — Discharge Summary (Addendum)
Addendum: Patient received IV Ativan for anxiety and this in combination with the fact that she has not slept very much in the last few nights and patient is now quite somnolent and very difficult to awaken.  Vital signs are stable, but not safe for her to be discharged home.  Have discontinued any further IV Ativan and will plan to discharge in the morning.   Physician Discharge Summary   Patient: Colleen [name] MRN: [id number] DOB: 1961/10/04  Admit date:     04/23/2022  Discharge date: 04/25/22  Discharge Physician: Annita Brod   PCP: Remi Haggard, FNP   Recommendations at discharge:   New medication: Prednisone taper New medication: Lidocaine patch on for 12 hours, off for 12 hours  Discharge Diagnoses: Principal Problem:   COPD exacerbation ([place]) Active Problems:   Acute on chronic respiratory failure with hypoxia ([place])   Depression with anxiety   Hyponatremia   Tobacco use disorder   Chronic pain syndrome  Resolved Problems:   * No resolved hospital problems. 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  Admitted for COPD exacerbation.   Assessment and Plan: * COPD exacerbation ([place]) - Presumed secondary to ongoing tobacco use and daily exposure to secondhand smoking from her spouse.  Continue nebs and steroids.  Procalcitonin normal, so no need for antibiotics.  Patient still quite dyspneic.  Needing oxygen 2 L continuous.  Check ambulatory pulse ox in the morning.  She may have some underlying cor pulmonale.  Echocardiogram done last year noted preserved ejection fraction but indeterminate diastolic function.  BMP unremarkable.  No evidence of heart failure.  Acute on chronic respiratory failure with hypoxia (HCC) Normally on 2 L only at night.  Over next few  days, improved and patient able to be weaned off of oxygen exams.  Checking ambulatory pulse ox prior to discharge.  Depression with anxiety - Resumed home fluoxetine 20 mg daily with breakfast, diazepam 2 mg p.o. daily as needed for anxiety Given tachycardia from albuterol nebulizers as well as IV steroids, patient has felt quite anxious during hospitalization.  Treating with as needed IV Ativan.  Should improve upon discharge.  Hyponatremia - Suspect secondary to dehydration.  Sodium on admission at 126.  Patient's sodiums from previous have ranged from 126-133.  128 on day of discharge.  GAD (generalized anxiety disorder) - Lorazepam 0.5 mg IV every 4 hours as needed for anxiety, 1 day ordered  Tobacco use disorder - Nicotine patch as needed ordered - Patient is not ready to quit  Chronic pain syndrome - Chronic back pain, patient stating 10 out of 10, on discussion with patient this is persistent and unchanged.  She states that she states that a 10 out of 10. - Patient also endorses that she would like to avoid opioid medication as it gives her anaphylactic shock and she has a sister who was a drug user therefore she is fearful of opioid medication - I counseled patient on alternating between an NSAID and Tylenol at home and using lidocaine patch - I have ordered Tylenol, lidocaine patch, and as needed Toradol during hospitalization.  Discharged on lidocaine patch.  MDD (major depressive disorder), recurrent episode, moderate (HCC) - Fluoxetine 20 mg daily with breakfast resumed         Consultants: None  Procedures performed: None Disposition: Home Diet recommendation:  Discharge Diet Orders (From admission, onward)     Start     Ordered   04/25/22 0000  Diet - low sodium heart healthy        04/25/22 1426           Cardiac diet DISCHARGE MEDICATION: Allergies as of 04/25/2022       Reactions   Desvenlafaxine Anaphylaxis   Morphine And Related Anaphylaxis    Penicillins Anaphylaxis   Has patient had a PCN reaction causing immediate rash, facial/tongue/throat swelling, SOB or lightheadedness with hypotension: Yes Has patient had a PCN reaction causing severe rash involving mucus membranes or skin necrosis: No Has patient had a PCN reaction that required hospitalization: Unknown Has patient had a PCN reaction occurring within the last 10 years: No If all of the above answers are "NO", then may proceed with Cephalosporin use.   Prazosin Other (See Comments)   Tamoxifen Anaphylaxis   Trazodone Anaphylaxis   Clonazepam    Codeine Swelling   Duloxetine Itching   Gabapentin Hives   Rash and swelling.    Hydroxyzine Itching   Lorazepam    Patient feels this medications makes her to sedated and wants to avoid   Paroxetine Hcl Hives   Sulfa Antibiotics Itching   Cephalexin Rash        Medication List     STOP taking these medications    azithromycin 250 MG tablet Commonly known as: ZITHROMAX       TAKE these medications    albuterol 108 (90 Base) MCG/ACT inhaler Commonly known as: VENTOLIN HFA Inhale 1-2 puffs into the lungs every 4 (four) hours as needed for cough or wheezing.   cholecalciferol 25 MCG (1000 UNIT) tablet Commonly known as: VITAMIN D3 Take 1,000 Units by mouth daily.   cyclobenzaprine 10 MG tablet Commonly known as: FLEXERIL Take 1 tablet (10 mg total) by mouth 3 (three) times daily as needed for muscle spasms.   denosumab 60 MG/ML Sosy injection Commonly known as: PROLIA Inject 60 mg into the skin every 6 (six) months.   diazepam 2 MG tablet Commonly known as: VALIUM Take 1 tablet (2 mg total) by mouth daily as needed for anxiety. For severe panic symptoms   EPINEPHrine 0.3 mg/0.3 mL Soaj injection Commonly known as: EPI-PEN Inject 0.3 mg into the muscle as needed for anaphylaxis.   FLUoxetine 20 MG tablet Commonly known as: PROZAC Take 1 tablet (20 mg total) by mouth daily with breakfast.    ipratropium 0.02 % nebulizer solution Commonly known as: ATROVENT Take 0.5 mg by nebulization 4 (four) times daily.   lidocaine 5 % Commonly known as: LIDODERM Place 1 patch onto the skin daily. Apply to lower back. Remove & Discard patch within 12 hours or as directed by MD   predniSONE 10 MG tablet Commonly known as: DELTASONE Take 6 tablets (60 mg total) by mouth daily with breakfast for 1 day, THEN 5 tablets (50 mg total) daily with breakfast for 1 day, THEN 4 tablets (40 mg total) daily with breakfast for 1 day, THEN 2 tablets (20 mg total) daily with breakfast for 1 day, THEN 1 tablet (10 mg total) daily with breakfast for 1 day. Start taking on: April 26, 2022 What changed:  medication strength See the new instructions.   Trelegy Ellipta 100-62.5-25 MCG/ACT Aepb Generic drug: Fluticasone-Umeclidin-Vilant Inhale 1 puff into the lungs daily.   vitamin B-12 500 MCG tablet Commonly known as: CYANOCOBALAMIN  Take 500 mcg by mouth daily.        Discharge Exam: There were no vitals filed for this visit. General: Alert and oriented x3, anxious Cardiovascular: Regular rate and rhythm, S1-S2 Lungs: Decreased breath sounds throughout  Condition at discharge: good  The results of significant diagnostics from this hospitalization (including imaging, microbiology, ancillary and laboratory) are listed below for reference.   Imaging Studies: DG Chest Portable 1 View  Result Date: 04/23/2022 CLINICAL DATA:  Shortness of breath EXAM: PORTABLE CHEST 1 VIEW COMPARISON:  04/15/2022 FINDINGS: The heart size and mediastinal contours are within normal limits. Emphysema. Diffuse bilateral interstitial pulmonary opacity. The visualized skeletal structures are unremarkable. IMPRESSION: Emphysema with diffuse bilateral interstitial pulmonary opacity, which may reflect edema or atypical/viral infection. No focal airspace opacity. Electronically Signed   By: Delanna Ahmadi M.D.   On: 04/23/2022  10:31   DG Chest Portable 1 View  Result Date: 04/15/2022 CLINICAL DATA:  Shortness of breath EXAM: PORTABLE CHEST 1 VIEW COMPARISON:  04/01/2022 FINDINGS: Generous lung volumes with apical emphysematous markings. There is no edema, consolidation, effusion, or pneumothorax. Normal heart size and mediastinal contours. IMPRESSION: COPD without acute superimposed finding. Electronically Signed   By: Jorje Guild M.D.   On: 04/15/2022 05:24   CT Soft Tissue Neck Wo Contrast  Result Date: 04/04/2022 CLINICAL DATA:  Neck pain. EXAM: CT NECK WITHOUT CONTRAST TECHNIQUE: Multidetector CT imaging of the neck was performed following the standard protocol without intravenous contrast. RADIATION DOSE REDUCTION: This exam was performed according to the departmental dose-optimization program which includes automated exposure control, adjustment of the mA and/or kV according to patient size and/or use of iterative reconstruction technique. COMPARISON:  None Available. FINDINGS: Pharynx and larynx: No focal mucosal or submucosal lesions are present. The airway is patent. Nasopharynx is within normal limits. Soft palate and tongue base are within normal limits. Vallecula and epiglottis are within normal limits. Aryepiglottic folds and piriform sinuses are clear. Vocal cords are midline and symmetric. Trachea is clear. Salivary glands: Submandibular and parotid glands are within normal limits. Thyroid: Normal Lymph nodes: No significant cervical adenopathy is present. Vascular: Atherosclerotic calcifications are present at the carotid bifurcations bilaterally without definite stenosis. Limited intracranial: Within normal limits. Limited by patient motion. Mastoids and visualized paranasal sinuses: The paranasal sinuses and mastoid air cells are clear. Skeleton: Multilevel degenerative changes are present. Bone detail limited by patient motion. Upper chest: Centrilobular emphysematous changes are present. Lung apices are  clear. Thoracic inlet is within normal limits. IMPRESSION: 1. No acute or significant lesion in the neck to explain the patient's symptoms. The airway is widely patent. 2. Multilevel degenerative changes in the cervical spine. 3. Emphysema (ICD10-J43.9). 4. The study is somewhat degraded by patient motion, decreasing sensitivity for small lesions. Electronically Signed   By: San Morelle M.D.   On: 04/04/2022 16:50   DG Lumbar Spine Complete  Result Date: 04/04/2022 CLINICAL DATA:  Progressive low back pain. EXAM: LUMBAR SPINE - COMPLETE 4+ VIEW COMPARISON:  MRI lumbar spine 06/02/2020 FINDINGS: There is no evidence of lumbar spine fracture. Alignment is normal. Intervertebral disc spaces are maintained. Slight rightward curvature is again noted. Atherosclerotic calcifications are present in the aorta without aneurysm. IMPRESSION: 1. No acute or focal abnormality of the lumbar spine. 2. Atherosclerosis. Electronically Signed   By: San Morelle M.D.   On: 04/04/2022 13:15   DG Thoracic Spine 2 View  Result Date: 04/04/2022 CLINICAL DATA:  Progressive back pain EXAM: THORACIC SPINE 2  VIEWS COMPARISON:  CT a chest 02/22/2022. Two-view chest x-ray 03/20/2022. FINDINGS: Multilevel degenerative changes are present in the thoracic spine with exaggerated thoracic kyphosis. Vertebral body heights are maintained. No acute abnormalities are present. No significant listhesis is present. Mild leftward curvature is present in the lower thoracic spine. IMPRESSION: 1. Multilevel degenerative changes in the thoracic spine are stable. 2. No acute fracture. Electronically Signed   By: San Morelle M.D.   On: 04/04/2022 13:13   DG Chest 2 View  Result Date: 04/01/2022 CLINICAL DATA:  Shortness of breath for a few days. EXAM: CHEST - 2 VIEW COMPARISON:  03/30/2022 FINDINGS: Heart size and pulmonary vascularity are normal. Bronchiectasis and peribronchial thickening consistent with chronic bronchitis.  No airspace disease or consolidation. No pleural effusions. No pneumothorax. Mediastinal contours appear intact. Healing fracture of the right posterior seventh rib. Degenerative changes in the spine. IMPRESSION: Bronchiectasis and chronic bronchitic changes in the lungs. No edema or consolidation. Electronically Signed   By: Lucienne Capers M.D.   On: 04/01/2022 18:21   DG Chest Port 1 View  Result Date: 03/30/2022 CLINICAL DATA:  Shortness of breath. EXAM: PORTABLE CHEST 1 VIEW COMPARISON:  03/20/2022 and prior studies FINDINGS: The cardiomediastinal silhouette is unremarkable. COPD/emphysema changes are again noted. There is no evidence of focal airspace disease, pulmonary edema, suspicious pulmonary nodule/mass, pleural effusion, or pneumothorax. No acute bony abnormalities are identified. Remote RIGHT rib fracture again noted. 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 Electronically Signed   By: Margarette Canada M.D.   On: 03/30/2022 10:22    Microbiology: Results for orders placed or performed during the hospital encounter of 04/15/22  Resp Panel by RT-PCR (Flu A&B, Covid) Anterior Nasal Swab     Status: None   Collection Time: 04/15/22  9:00 AM   Specimen: Anterior Nasal Swab  Result Value Ref Range Status   SARS Coronavirus 2 by RT PCR NEGATIVE NEGATIVE Final    Comment: (NOTE) SARS-CoV-2 target nucleic acids are NOT DETECTED.  The SARS-CoV-2 RNA is generally detectable in upper respiratory specimens during the acute phase of infection. The lowest concentration of SARS-CoV-2 viral copies this assay can detect is 138 copies/mL. A negative result does not preclude SARS-Cov-2 infection and should not be used as the sole basis for treatment or other patient management decisions. A negative result may occur with  improper specimen collection/handling, submission of specimen other than nasopharyngeal swab, presence of viral mutation(s) within the areas  targeted by this assay, and inadequate number of viral copies(<138 copies/mL). A negative result must be combined with clinical observations, patient history, and epidemiological information. The expected result is Negative.  Fact Sheet for Patients:  EntrepreneurPulse.com.au  Fact Sheet for Healthcare Providers:  IncredibleEmployment.be  This test is no t yet approved or cleared by the [place] FDA and  has been authorized for detection and/or diagnosis of SARS-CoV-2 by FDA under an Emergency Use Authorization (EUA). This EUA will remain  in effect (meaning this test can be used) for the duration of the COVID-19 declaration under Section 564(b)(1) of the Act, 21 U.S.C.section 360bbb-3(b)(1), unless the authorization is terminated  or revoked sooner.       Influenza A by PCR NEGATIVE NEGATIVE Final   Influenza B by PCR NEGATIVE NEGATIVE Final    Comment: (NOTE) The Xpert Xpress SARS-CoV-2/FLU/RSV plus assay is intended as an aid in the diagnosis of influenza from Nasopharyngeal swab specimens and should not be used as a sole basis for treatment. Nasal  washings and aspirates are unacceptable for Xpert Xpress SARS-CoV-2/FLU/RSV testing.  Fact Sheet for Patients: EntrepreneurPulse.com.au  Fact Sheet for Healthcare Providers: IncredibleEmployment.be  This test is not yet approved or cleared by the [place] FDA and has been authorized for detection and/or diagnosis of SARS-CoV-2 by FDA under an Emergency Use Authorization (EUA). This EUA will remain in effect (meaning this test can be used) for the duration of the COVID-19 declaration under Section 564(b)(1) of the Act, 21 U.S.C. section 360bbb-3(b)(1), unless the authorization is terminated or revoked.  Performed at [facility], [address] [place], [place] [address]     Labs: CBC: Recent Labs  Lab 04/23/22 1035  04/24/22 0442  WBC 6.0 6.6  HGB 14.0 12.0  HCT 41.3 36.6  MCV 91.0 91.3  PLT 294 270   Basic Metabolic Panel: Recent Labs  Lab 04/23/22 1035 04/24/22 0442 04/25/22 0624  NA 126* 129* 128*  K 4.5 4.3 4.6  CL 93* 98 93*  CO2 '25 25 28  '$ GLUCOSE 109* 106* 108*  BUN <5* 12 9  CREATININE 0.35* 0.45 0.33*  CALCIUM 8.7* 8.6* 9.0   Liver Function Tests: No results for input(s): "AST", "ALT", "ALKPHOS", "BILITOT", "PROT", "ALBUMIN" in the last 168 hours. CBG: No results for input(s): "GLUCAP" in the last 168 hours.  Discharge time spent: less than 30 minutes.  Signed: Annita Brod, MD [facility] 04/25/2022

## 2022-04-26 ENCOUNTER — Inpatient Hospital Stay

## 2022-04-26 DIAGNOSIS — F418 Other specified anxiety disorders: Secondary | ICD-10-CM | POA: Diagnosis not present

## 2022-04-26 DIAGNOSIS — J9621 Acute and chronic respiratory failure with hypoxia: Secondary | ICD-10-CM | POA: Diagnosis not present

## 2022-04-26 DIAGNOSIS — E871 Hypo-osmolality and hyponatremia: Secondary | ICD-10-CM | POA: Diagnosis not present

## 2022-04-26 DIAGNOSIS — J441 Chronic obstructive pulmonary disease with (acute) exacerbation: Secondary | ICD-10-CM | POA: Diagnosis not present

## 2022-04-26 IMAGING — CR DG CHEST 2V
2 series · 2 of 2 positions shown · non-contrast
Comparison: [DATE] and prior studies

CLINICAL DATA: Cough.

EXAM:
CHEST - 2 VIEW

[chest lat]
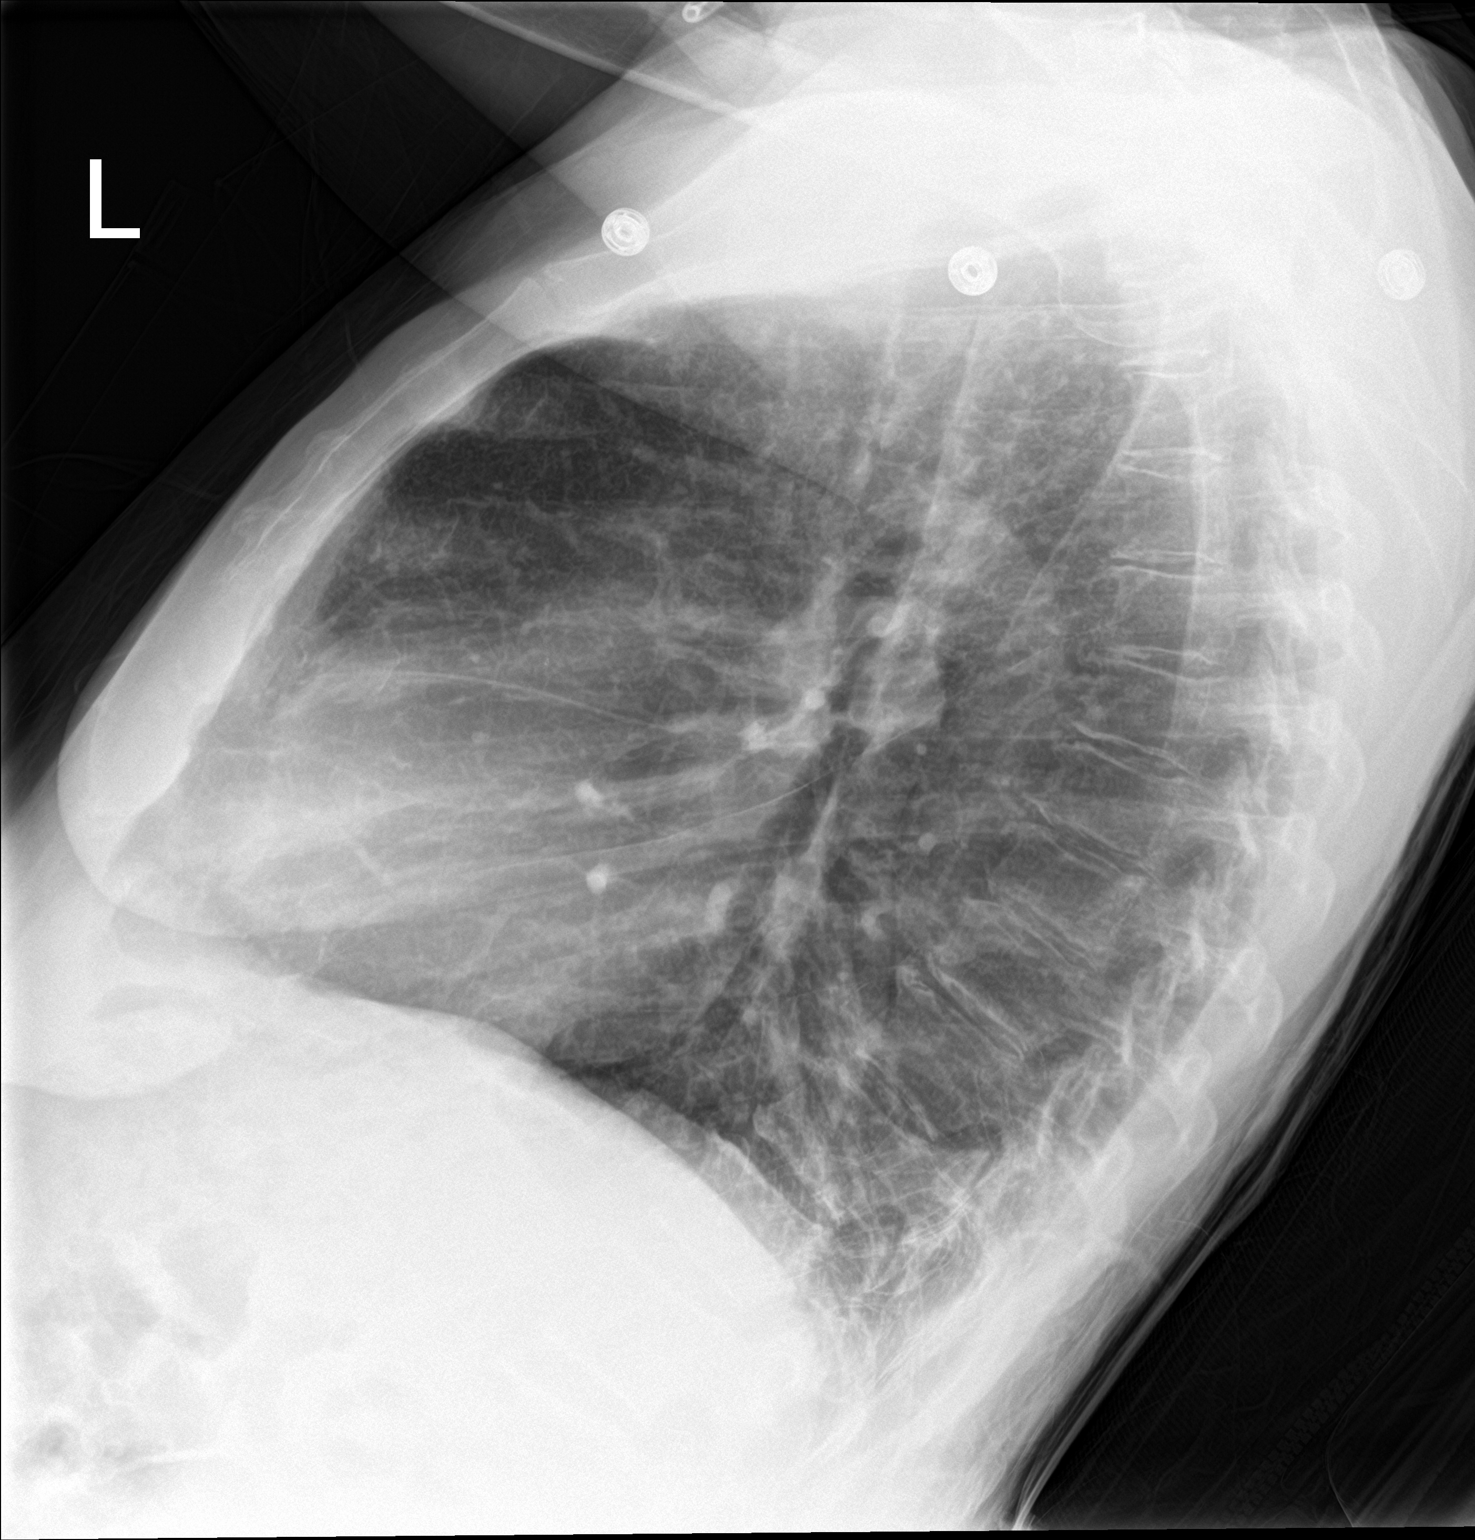

[chest ap]
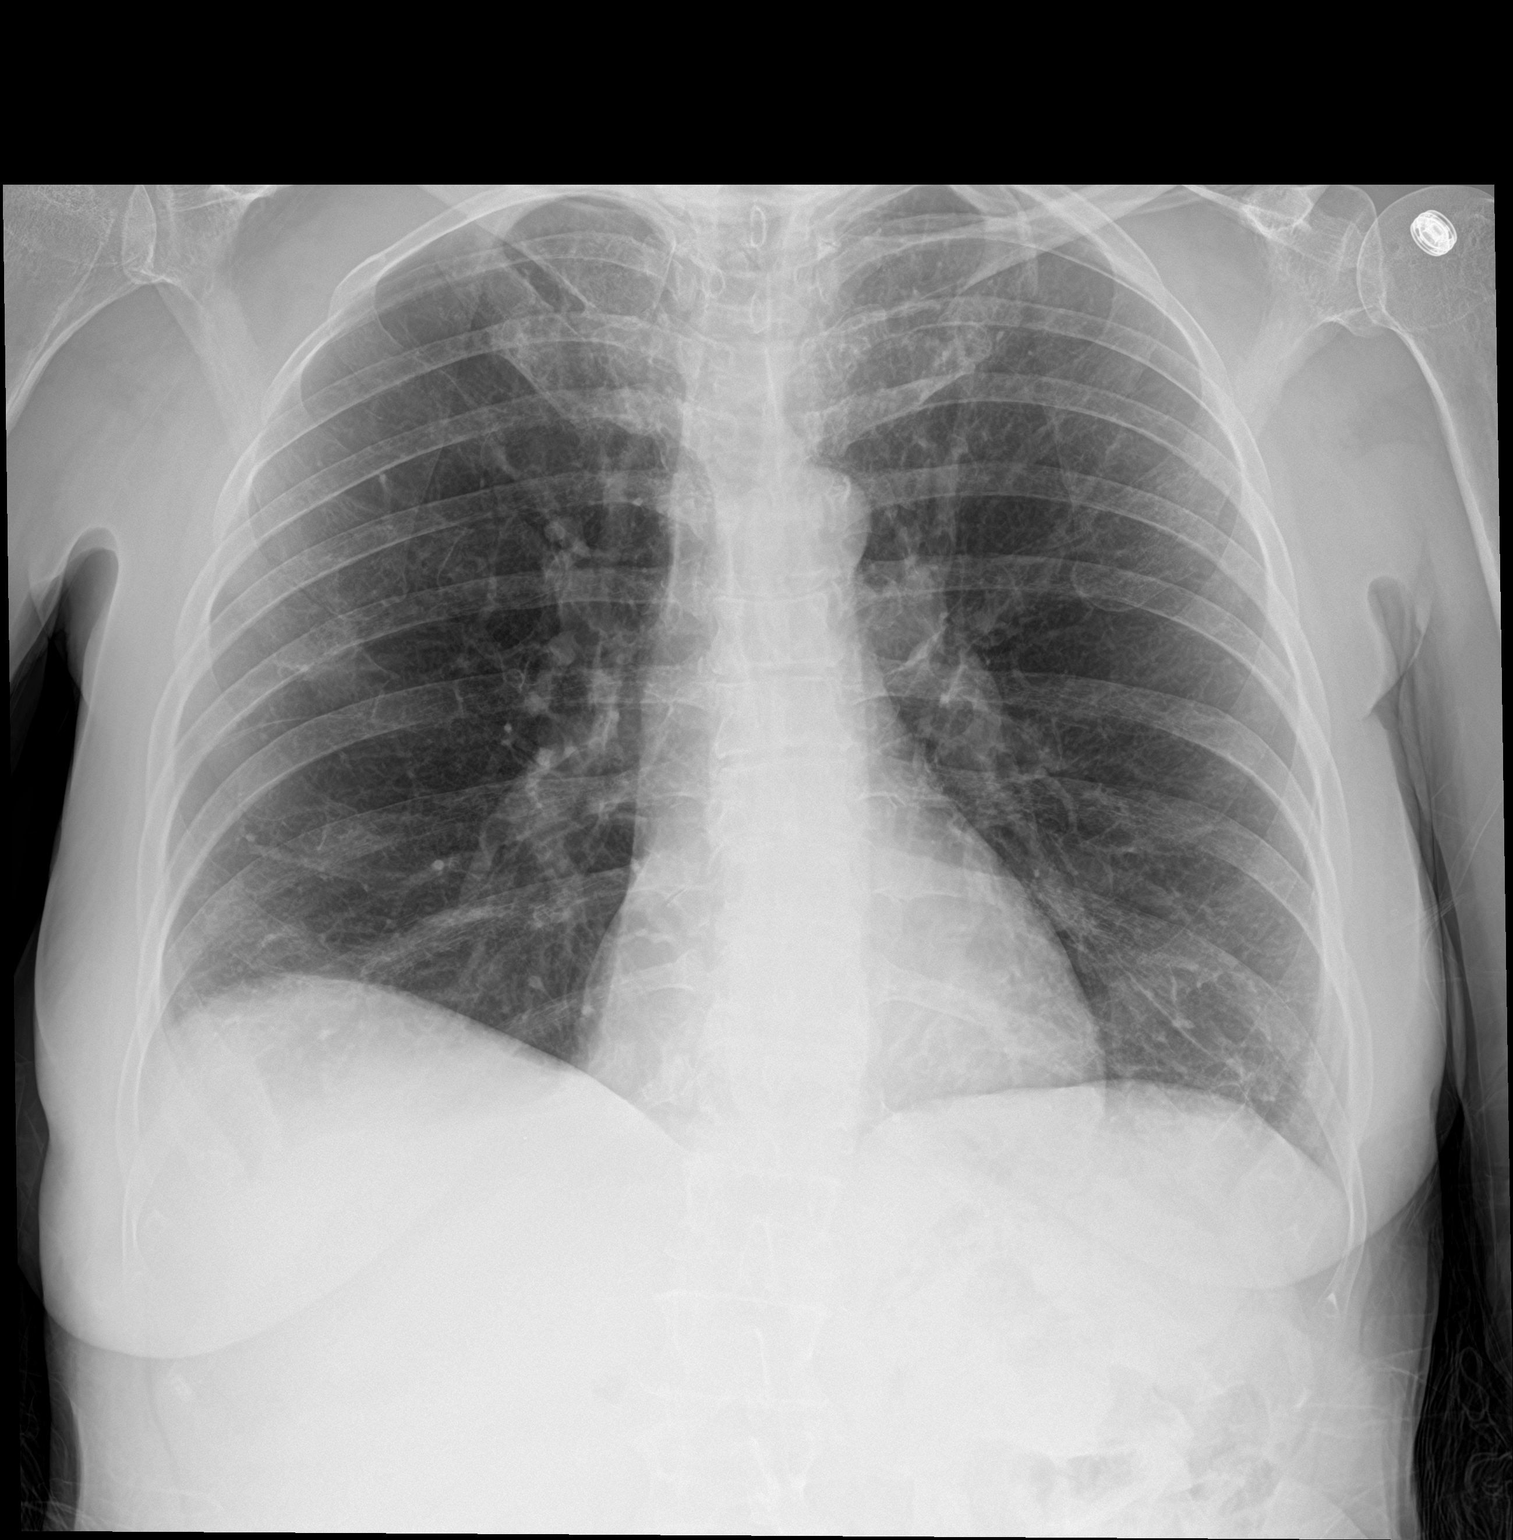

[2 of 2 positions shown; findings below may reference images not displayed]

FINDINGS: Cardiomediastinal silhouette is stable.

Emphysema again identified.

There is no evidence of focal airspace disease, pulmonary edema,
suspicious pulmonary nodule/mass, pleural effusion, or pneumothorax.

No acute bony abnormalities are identified.
IMPRESSION: Emphysema without evidence of acute cardiopulmonary disease.

## 2022-04-26 MED ORDER — FUROSEMIDE 10 MG/ML IJ SOLN
20.0000 mg | Freq: Two times a day (BID) | INTRAMUSCULAR | Status: DC
  Administered 2022-04-26 – 2022-04-27 (×2): 20 mg via INTRAVENOUS
  Filled 2022-04-26 (×2): qty 2

## 2022-04-26 MED ORDER — ACETAMINOPHEN 325 MG PO TABS
650.0000 mg | ORAL_TABLET | Freq: Four times a day (QID) | ORAL | Status: DC | PRN
  Administered 2022-04-27 – 2022-04-29 (×6): 650 mg via ORAL
  Filled 2022-04-26 (×7): qty 2

## 2022-04-26 MED ORDER — HYDROCORTISONE 1 % EX CREA
TOPICAL_CREAM | Freq: Four times a day (QID) | CUTANEOUS | Status: DC
  Filled 2022-04-26 (×2): qty 28

## 2022-04-26 MED ORDER — PREDNISONE 50 MG PO TABS
50.0000 mg | ORAL_TABLET | Freq: Every day | ORAL | Status: DC
  Administered 2022-04-27: 50 mg via ORAL
  Filled 2022-04-26: qty 1

## 2022-04-26 MED ORDER — ACETAMINOPHEN 650 MG RE SUPP: 650.0000 mg | Freq: Four times a day (QID) | RECTAL | Status: DC | PRN

## 2022-04-26 MED ORDER — MELATONIN 5 MG PO TABS
5.0000 mg | ORAL_TABLET | Freq: Every evening | ORAL | Status: DC | PRN
Start: 2022-04-26 — End: 2022-04-29
  Administered 2022-04-26 – 2022-04-28 (×3): 5 mg via ORAL
  Filled 2022-04-26 (×3): qty 1

## 2022-04-26 NOTE — Progress Notes (Signed)
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  Admitted for COPD exacerbation.   Assessment and Plan: Assessment and Plan: * COPD exacerbation ([place]) - Presumed secondary to ongoing tobacco use and daily exposure to secondhand smoking from her spouse.  Continue nebs and steroids.  Procalcitonin normal, so no need for antibiotics.  Patient still quite dyspneic.    She may have some underlying cor pulmonale.  Echocardiogram done last year noted preserved ejection fraction but indeterminate diastolic function.  BNP unremarkable, but given lack of improvement, will try Lasix.  Wheezing resolved so tapering steroids.  Acute on chronic respiratory failure with hypoxia (HCC) Normally on 2 L only at night.  Initially had been weaning down, but by 6/17, still somewhat dyspneic.  With ambulation, O2 sats on 3 L at 84%, but with rest O2 sats at 93% (on 3L).  Checking chest xray and starting IV Lasix also.  Depression with anxiety - Resumed home fluoxetine 20 mg daily with breakfast, diazepam 2 mg p.o. daily as needed for anxiety Given tachycardia from albuterol nebulizers as well as IV steroids, patient has felt quite anxious during hospitalization.   Hyponatremia - Suspect secondary to dehydration.  Sodium on admission at 126.  Patient's sodiums from previous have ranged from 126-133.  Recheck in the morning.  GAD (generalized anxiety disorder) - Lorazepam 0.5 mg IV every 4 hours as needed for anxiety, 1 day ordered  Tobacco use disorder - Nicotine patch as needed ordered - Patient is not ready to quit  Chronic pain syndrome - Chronic  back pain, patient stating 10 out of 10, on discussion with patient this is persistent and unchanged.  She states that she states that a 10 out of 10. - Patient also endorses that she would like to avoid opioid medication as it gives her anaphylactic shock and she has a sister who was a drug user therefore she is fearful of opioid medication - I counseled patient on alternating between an NSAID and Tylenol at home and using lidocaine patch - I have ordered Tylenol, lidocaine patch, and as needed Toradol during hospitalization.    MDD (major depressive disorder), recurrent episode, moderate (HCC) - Fluoxetine 20 mg daily with breakfast resumed       There is no height or weight on file to calculate BMI.        Consultants: None  Procedures: None  Antimicrobials: None  Code Status: Full code   Subjective: Feels more dyspneic  Objective: Vital signs were reviewed and unremarkable. Vitals:   04/26/22 1147 04/26/22 1359  BP: 117/81   Pulse: 95   Resp: 18   Temp: 98.4 F (36.9 C)   SpO2: 96% 97%    Intake/Output Summary (Last 24 hours) at 04/26/2022 1410 Last data filed at 04/26/2022 0900 Gross per 24 hour  Intake 240 ml  Output --  Net 240 ml    There were no vitals filed for this visit. There is no height or weight on file to calculate BMI.  Exam:  General: Alert and oriented x3, mildly anxious HEENT: Normocephalic, atraumatic, mucous membranes are moist Cardiovascular: Regular rate and rhythm, borderline tachycardia  Respiratory: Decreased breath sounds throughout, RLL crackles Abdomen: Soft, nontender, nondistended, positive bowel sounds Musculoskeletal: No clubbing or cyanosis or edema Skin: No skin breaks, tears or lesions Psychiatry: Appropriate, no evidence of psychoses Neurology: No focal deficits  Data Reviewed: No labs today  Disposition:  Status is: Inpatient     Anticipated discharge date: 6/19  Remaining issues to be resolved so that  patient can be discharged: Improvement in hypoxia   Family Communication: Updated husband DVT Prophylaxis: enoxaparin (LOVENOX) injection 40 mg Start: 04/23/22 2200 Place TED hose Start: 04/23/22 1344    Author: Annita Brod ,MD 04/26/2022 2:10 PM  To reach On-call, see care teams to locate the attending and reach out via [email or web address]. Between 7PM-7AM, please contact night-coverage If you still have difficulty reaching the attending provider, please page the [facility] (Director on Call) for [facility] on amion for assistance.

## 2022-04-27 DIAGNOSIS — E871 Hypo-osmolality and hyponatremia: Secondary | ICD-10-CM | POA: Diagnosis not present

## 2022-04-27 DIAGNOSIS — F418 Other specified anxiety disorders: Secondary | ICD-10-CM | POA: Diagnosis not present

## 2022-04-27 DIAGNOSIS — J9621 Acute and chronic respiratory failure with hypoxia: Secondary | ICD-10-CM | POA: Diagnosis not present

## 2022-04-27 DIAGNOSIS — J441 Chronic obstructive pulmonary disease with (acute) exacerbation: Secondary | ICD-10-CM | POA: Diagnosis not present

## 2022-04-27 LAB — BASIC METABOLIC PANEL
Anion gap: 6 (ref 5–15)
BUN: 11 mg/dL (ref 8–23)
CO2: 31 mmol/L (ref 22–32)
Calcium: 8.5 mg/dL — ABNORMAL LOW (ref 8.9–10.3)
Chloride: 89 mmol/L — ABNORMAL LOW (ref 98–111)
Creatinine, Ser: 0.35 mg/dL — ABNORMAL LOW (ref 0.44–1.00)
GFR, Estimated: >60 mL/min (ref >60)
Glucose, Bld: 79 mg/dL (ref 70–99)
Potassium: 4.1 mmol/L (ref 3.5–5.1)
Sodium: 126 mmol/L — ABNORMAL LOW (ref 135–145)

## 2022-04-27 MED ORDER — DIAZEPAM 2 MG PO TABS
2.0000 mg | ORAL_TABLET | Freq: Every day | ORAL | Status: DC | PRN
  Administered 2022-04-27 – 2022-04-28 (×2): 2 mg via ORAL
  Filled 2022-04-27 (×2): qty 1

## 2022-04-27 MED ORDER — PREDNISONE 20 MG PO TABS
40.0000 mg | ORAL_TABLET | Freq: Every day | ORAL | Status: DC
  Administered 2022-04-28: 40 mg via ORAL
  Filled 2022-04-27: qty 2

## 2022-04-27 NOTE — Plan of Care (Signed)

## 2022-04-27 NOTE — Progress Notes (Signed)
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  Admitted for COPD exacerbation.   Assessment and Plan: Assessment and Plan: * COPD exacerbation ([place]) - Presumed secondary to ongoing tobacco use and daily exposure to secondhand smoking from her spouse.  Continue nebs and steroids.  Procalcitonin normal, so no need for antibiotics.  Patient still quite dyspneic.    She may have some underlying cor pulmonale.  Echocardiogram done last year noted preserved ejection fraction but indeterminate diastolic function.  BNP unremarkable, but given lack of improvement, will try Lasix.  Wheezing resolved so tapering steroids.  Acute on chronic respiratory failure with hypoxia (HCC) Normally on 2 L only at night.  Initially had been weaning down, but by 6/17, still somewhat dyspneic.  With ambulation, O2 sats on 3 L at 84%, but with rest O2 sats at 93% (on 3L).  Repeat chest x-ray unremarkable.  Has responded little to IV Lasix and is down to 2 L nasal cannula.  Need to maximize ambulation and get out of bed to chair during the daytime.  Depression with anxiety - Resumed home fluoxetine 20 mg daily with breakfast, diazepam 2 mg p.o. daily as needed for anxiety. Given tachycardia from albuterol nebulizers as well as IV steroids, patient has felt quite anxious during hospitalization.   Need to prevent oversedation so have discontinued IV Ativan and resumed her once a day only Valium.  Hyponatremia - Suspect secondary to dehydration.  Sodium on admission at 126.  Patient's sodiums from previous have ranged from 126-133.  Recheck in the morning.  GAD  (generalized anxiety disorder) - Lorazepam 0.5 mg IV every 4 hours as needed for anxiety, 1 day ordered  Tobacco use disorder - Nicotine patch as needed ordered - Patient is not ready to quit  Chronic pain syndrome - Chronic back pain, patient stating 10 out of 10, on discussion with patient this is persistent and unchanged.  She states that she states that a 10 out of 10. - Patient also endorses that she would like to avoid opioid medication as it gives her anaphylactic shock and she has a sister who was a drug user therefore she is fearful of opioid medication - I counseled patient on alternating between an NSAID and Tylenol at home and using lidocaine patch - I have ordered Tylenol, lidocaine patch, and as needed Toradol during hospitalization.    MDD (major depressive disorder), recurrent episode, moderate (HCC) - Fluoxetine 20 mg daily with breakfast resumed       There is no height or weight on file to calculate BMI.        Consultants: None  Procedures: None  Antimicrobials: None  Code Status: Full code   Subjective: Feels very weak  Objective: Vital signs were reviewed and unremarkable. Vitals:   04/27/22 0746 04/27/22 1440  BP:    Pulse:    Resp:    Temp:    SpO2: 93% 94%    Intake/Output Summary (Last 24 hours) at 04/27/2022 1532 Last data filed at 04/27/2022 1300 Gross per 24 hour  Intake 720 ml  Output --  Net 720 ml    There were  no vitals filed for this visit. There is no height or weight on file to calculate BMI.  Exam:  General: Alert and oriented x3, mildly anxious HEENT: Normocephalic, atraumatic, mucous membranes are moist Cardiovascular: Regular rate and rhythm, borderline tachycardia Respiratory: Decreased breath sounds throughout, RLL crackles, no wheezes Abdomen: Soft, nontender, nondistended, positive bowel sounds Musculoskeletal: No clubbing or cyanosis or edema Skin: No skin breaks, tears or lesions Psychiatry: Appropriate,  no evidence of psychoses Neurology: No focal deficits  Data Reviewed: Sodium of 126.  Disposition:  Status is: Inpatient     Anticipated discharge date: 6/19  Remaining issues to be resolved so that patient can be discharged: Improvement in hypoxia   Family Communication: Updated husband DVT Prophylaxis: enoxaparin (LOVENOX) injection 40 mg Start: 04/23/22 2200 Place TED hose Start: 04/23/22 1344    Author: Annita Brod ,MD 04/27/2022 3:32 PM  To reach On-call, see care teams to locate the attending and reach out via [email or web address]. Between 7PM-7AM, please contact night-coverage If you still have difficulty reaching the attending provider, please page the [facility] (Director on Call) for [facility] on amion for assistance.

## 2022-04-27 NOTE — Plan of Care (Signed)
  Problem: Education: Goal: Knowledge of General Education information will improve Description: Including pain rating scale, medication(s)/side effects and non-pharmacologic comfort measures Outcome: Progressing   Problem: Health Behavior/Discharge Planning: Goal: Ability to manage health-related needs will improve Outcome: Progressing   Problem: Clinical Measurements: Goal: Ability to maintain clinical measurements within normal limits will improve Outcome: Progressing Goal: Will remain free from infection Outcome: Progressing Goal: Diagnostic test results will improve Outcome: Progressing Goal: Respiratory complications will improve Outcome: Progressing Goal: Cardiovascular complication will be avoided Outcome: Progressing   Problem: Activity: Goal: Risk for activity intolerance will decrease Outcome: Progressing   Problem: Nutrition: Goal: Adequate nutrition will be maintained Outcome: Progressing   Problem: Elimination: Goal: Will not experience complications related to bowel motility Outcome: Progressing Goal: Will not experience complications related to urinary retention Outcome: Progressing   Problem: Pain Managment: Goal: General experience of comfort will improve Outcome: Progressing   Problem: Safety: Goal: Ability to remain free from injury will improve Outcome: Progressing   Problem: Skin Integrity: Goal: Risk for impaired skin integrity will decrease Outcome: Progressing   Problem: Coping: Goal: Level of anxiety will decrease Outcome: Not Progressing Note: Pt still continues to be very anxious. PRN anti-anxiety medication helps for a short period of time.

## 2022-04-27 NOTE — Progress Notes (Signed)
PT Cancellation Note  Patient Details Name: Lucely Leard MRN: [id number] DOB: 1961/08/17   Cancelled Treatment:    Reason Eval/Treat Not Completed: Patient declined, no reason specified. Pt refused stating that she was not doing well and could not participate. RN notified. Per RN patient ambulates with SBA and intermitted knee buckle of unknown case. No falls reported. PT will attempt to evaluate at later date.    Ranald Alessio A Dorris Vangorder 04/27/2022, 10:46 AM

## 2022-04-28 ENCOUNTER — Ambulatory Visit: Admitting: Psychiatry

## 2022-04-28 DIAGNOSIS — E871 Hypo-osmolality and hyponatremia: Secondary | ICD-10-CM | POA: Diagnosis not present

## 2022-04-28 DIAGNOSIS — F418 Other specified anxiety disorders: Secondary | ICD-10-CM | POA: Diagnosis not present

## 2022-04-28 DIAGNOSIS — J441 Chronic obstructive pulmonary disease with (acute) exacerbation: Secondary | ICD-10-CM | POA: Diagnosis not present

## 2022-04-28 DIAGNOSIS — J9621 Acute and chronic respiratory failure with hypoxia: Secondary | ICD-10-CM | POA: Diagnosis not present

## 2022-04-28 LAB — BASIC METABOLIC PANEL
Anion gap: 8 (ref 5–15)
BUN: 14 mg/dL (ref 8–23)
CO2: 29 mmol/L (ref 22–32)
Calcium: 9.1 mg/dL (ref 8.9–10.3)
Chloride: 88 mmol/L — ABNORMAL LOW (ref 98–111)
Creatinine, Ser: 0.44 mg/dL (ref 0.44–1.00)
GFR, Estimated: >60 mL/min (ref >60)
Glucose, Bld: 86 mg/dL (ref 70–99)
Potassium: 4.3 mmol/L (ref 3.5–5.1)
Sodium: 125 mmol/L — ABNORMAL LOW (ref 135–145)

## 2022-04-28 MED ORDER — STERILE WATER FOR INJECTION IJ SOLN
INTRAMUSCULAR | Status: AC
  Filled 2022-04-28: qty 10

## 2022-04-28 MED ORDER — SODIUM CHLORIDE 0.9 % IV SOLN: INTRAVENOUS | Status: DC

## 2022-04-28 MED ORDER — TRAMADOL HCL 50 MG PO TABS
50.0000 mg | ORAL_TABLET | Freq: Four times a day (QID) | ORAL | Status: DC | PRN
  Administered 2022-04-28 – 2022-04-29 (×3): 50 mg via ORAL
  Filled 2022-04-28 (×3): qty 1

## 2022-04-28 MED ORDER — DIPHENHYDRAMINE HCL 50 MG/ML IJ SOLN
25.0000 mg | Freq: Four times a day (QID) | INTRAMUSCULAR | Status: DC | PRN
  Administered 2022-04-28 – 2022-04-29 (×4): 25 mg via INTRAVENOUS
  Filled 2022-04-28 (×4): qty 1

## 2022-04-28 MED ORDER — METHYLPREDNISOLONE SODIUM SUCC 40 MG IJ SOLR
40.0000 mg | Freq: Every day | INTRAMUSCULAR | Status: DC
Start: 2022-04-28 — End: 2022-04-29
  Administered 2022-04-28 – 2022-04-29 (×2): 40 mg via INTRAVENOUS
  Filled 2022-04-28 (×2): qty 1

## 2022-04-28 MED ORDER — DIPHENHYDRAMINE HCL 25 MG PO CAPS
25.0000 mg | ORAL_CAPSULE | Freq: Three times a day (TID) | ORAL | Status: DC | PRN
Start: 2022-04-28 — End: 2022-04-28
  Administered 2022-04-28: 25 mg via ORAL
  Filled 2022-04-28: qty 1

## 2022-04-28 NOTE — Progress Notes (Signed)
Pt had a restless night. Administered several PRNs options and nursing care options with minimal relief. Pt educated on nicotine patch and finally agreed to try, as pt c/o of headache, restless, feeling on edge. See PRN MAR for full interventions administered per MD orders. Pt had Melatonin 5 mg with little effectiveness. Pt feeling tired and exhausted. SRP, RN

## 2022-04-28 NOTE — Clinical Social Work Note (Cosign Needed)
RE:  Colleen [name]   Date of Birth:   May 18, 1962___________  Date:  04/28/2022      To Whom It May Concern:  Please be advised that the above-named patient will require a short-term nursing home stay - anticipated 30 days or less for rehabilitation and strengthening.  The plan is for return home.   MD electronic signature noted below

## 2022-04-28 NOTE — Progress Notes (Signed)
Patient has been c/o nausea, lightheadedness, itching all over;describing it as itching from inside out and feeling like her mouth was swelling. Made MD aware, new order for IV fluids, Benadryl and orthostatics.

## 2022-04-28 NOTE — Evaluation (Signed)
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  Admitted for COPD exacerbation.   Clinical Impression  Patient alert, speaks minimally to PT due to pt reported dizziness and lightheadedness. She did state at baseline she lives with her husband who recently has had to help with ADLs, and that he performs all IADLs for the last month or two.   She was able to move all extremities against gravity and with encouragement and education, transferred to sitting EOB with supervision. spO2 and HR checked in both sitting and standing spO2 >92% and HR ranging from 103-118. Sit <> stand with handheld assist and minA to step towards HOB. Very effortful for pt, and moved very slowly. Declined any further mobility at this time.  Overall the patient demonstrated deficits (see "PT Problem List") that impede the patient's functional abilities, safety, and mobility and would benefit from skilled PT intervention. Recommendation is SNF pending further progress with mobility.        Recommendations for follow up therapy are one component of a multi-disciplinary discharge planning process, led by the attending physician.  Recommendations may be updated based on patient status, additional functional criteria and insurance authorization.  Follow Up Recommendations Skilled nursing-short term rehab (<3 hours/day)    Assistance Recommended at Discharge Frequent or constant Supervision/Assistance  Patient can return home with the following  A lot of help with walking and/or transfers;A lot of help with bathing/dressing/bathroom;Assistance with cooking/housework;Direct supervision/assist for  financial management;Assist for transportation;Help with stairs or ramp for entrance;Direct supervision/assist for medications management    Equipment Recommendations Rolling walker (2 wheels)  Recommendations for Other Services       Functional Status Assessment Patient has had a recent decline in their functional status and demonstrates the ability to make significant improvements in function in a reasonable and predictable amount of time.     Precautions / Restrictions Precautions Precautions: Fall Restrictions Weight Bearing Restrictions: No      Mobility  Bed Mobility Overal bed mobility: Needs Assistance Bed Mobility: Supine to Sit, Sit to Supine     Supine to sit: Supervision, HOB elevated Sit to supine: Supervision, HOB elevated        Transfers Overall transfer level: Needs assistance Equipment used: 1 person hand held assist Transfers: Sit to/from Stand Sit to Stand: Min guard                Ambulation/Gait Ambulation/Gait assistance: Min assist             General Gait Details: pt deferred true ambulation but did take 2-3 steps towards [facility] with handheld assist and cueing. pt very slow  Financial trader Rankin (Stroke Patients Only)       Balance Overall balance assessment: Needs assistance Sitting-balance support: Feet supported Sitting balance-Leahy Scale: Fair       Standing balance-Leahy Scale: Poor                               Pertinent Vitals/Pain Pain Assessment Pain Assessment: No/denies pain (Patient reported feeling light headed,  nauseous, that her feet were swollen, and that she was feeling very SOB)    Home Living Family/patient expects to be discharged to:: Private residence Living Arrangements: Spouse/significant other Available Help at Discharge: Family;Available 24 hours/day Type of Home: House Home Access: Stairs to enter Entrance Stairs-Rails:  Psychiatric nurse of Steps: 5   Home Layout: One level Home Equipment: Cane - single point      Prior Function Prior Level of Function : Independent/Modified Independent               ADLs Comments: pt reported that her spouse     Hand Dominance        Extremity/Trunk Assessment   Upper Extremity Assessment Upper Extremity Assessment: Generalized weakness    Lower Extremity Assessment Lower Extremity Assessment: Generalized weakness    Cervical / Trunk Assessment Cervical / Trunk Assessment: Normal  Communication   Communication: No difficulties  Cognition Arousal/Alertness: Awake/alert Behavior During Therapy: WFL for tasks assessed/performed Overall Cognitive Status: Within Functional Limits for tasks assessed                                          General Comments      Exercises     Assessment/Plan    PT Assessment Patient needs continued PT services  PT Problem List Decreased mobility;Decreased strength;Decreased balance;Decreased activity tolerance       PT Treatment Interventions DME instruction;Therapeutic exercise;Balance training;Gait training;Stair training;Neuromuscular re-education;Functional mobility training;Therapeutic activities;Patient/family education    PT Goals (Current goals can be found in the Care Plan section)  Acute Rehab PT Goals Patient Stated Goal: to go home PT Goal Formulation: With patient Time For Goal Achievement: 05/12/22 Potential to Achieve Goals: Good    Frequency Min 2X/week     Co-evaluation               AM-PAC PT "6 Clicks" Mobility  Outcome Measure Help needed turning from your back to your side while in a flat bed without using bedrails?: None Help needed moving from lying on your back to sitting on the side of a flat bed without using bedrails?: None Help needed moving to and from a bed to a chair (including a wheelchair)?: None Help needed standing up from a  chair using your arms (e.g., wheelchair or bedside chair)?: A Little Help needed to walk in hospital room?: A Little Help needed climbing 3-5 steps with a railing? : A Lot 6 Click Score: 20    End of Session Equipment Utilized During Treatment: Gait belt Activity Tolerance: Patient limited by fatigue Patient left: in bed;with call bell/phone within reach;with bed alarm set Nurse Communication: Mobility status PT Visit Diagnosis: Other abnormalities of gait and mobility (R26.89);Difficulty in walking, not elsewhere classified (R26.2)    Time: 0175-1025 PT Time Calculation (min) (ACUTE ONLY): 9 min   Charges:   PT Evaluation $PT Eval Low Complexity: 1 Low          Lieutenant Diego PT, DPT 11:35 AM,04/28/22

## 2022-04-28 NOTE — Progress Notes (Signed)
PT Cancellation Note  Patient Details Name: Colleen [name] MRN: [id number] DOB: Oct 13, 1961   Cancelled Treatment:    Reason Eval/Treat Not Completed: Other (comment). Pt very nauseous, a lot of coughing this morning, declining mobility at this time. PT to re-attempt as able.    Lieutenant Diego PT, DPT 9:22 AM,04/28/22

## 2022-04-28 NOTE — NC FL2 (Signed)
[place] LEVEL OF CARE SCREENING TOOL     IDENTIFICATION  Patient Name: Colleen [name] Birthdate: 11-Dec-1960 Sex: female Admission Date (Current Location): 04/23/2022  [facility] and [place] Number:  Engineering geologist and Address:  [facility], [address], [place], [place] [address]      Provider Number: [id number]  Attending Physician Name and Address:  Annita Brod, MD  Relative Name and Phone Number:       Current Level of Care: Hospital Recommended Level of Care: [place] Prior Approval Number:    Date Approved/Denied:   PASRR Number:    Discharge Plan: SNF    Current Diagnoses: Patient Active Problem List   Diagnosis Date Noted   MDD (major depressive disorder), recurrent episode, moderate ([place]) 04/17/2022   COPD exacerbation ([place]) 03/30/2022   Depression with anxiety 03/30/2022   Coronary artery disease 03/10/2022   Dependence on nocturnal oxygen therapy 03/10/2022   History of breast cancer 03/10/2022   At risk for prolonged QT interval syndrome 11/19/2021   Hyponatremia 10/19/2021   Tobacco use disorder 06/19/2021   Insomnia due to medical condition 03/21/2021   PTSD (post-traumatic stress disorder) 03/04/2021   Cervical myofascial pain syndrome 07/09/2020   Chronic pain syndrome 06/04/2020   Osteopenia of multiple sites 06/04/2020   Age related osteoporosis 06/04/2020   Low back pain 04/27/2020   Panic attack 03/20/2020   Acute on chronic respiratory failure with hypoxia (HCC)    COPD (chronic obstructive pulmonary disease) ([place]) 10/11/2018   GAD (generalized anxiety disorder) 10/11/2018    Orientation RESPIRATION BLADDER Height & Weight     Self, Time, Situation, Place  Normal Continent Weight: 115 lb 11.9 oz (52.5 kg) Height:  5' (152.4 cm)  BEHAVIORAL SYMPTOMS/MOOD NEUROLOGICAL BOWEL NUTRITION STATUS      Continent Diet (regular diet, thin liquids)  AMBULATORY STATUS COMMUNICATION  OF NEEDS Skin   Extensive Assist Verbally Normal                       Personal Care Assistance Level of Assistance  Dressing, Feeding, Bathing Bathing Assistance: Maximum assistance Feeding assistance: Independent Dressing Assistance: Maximum assistance     Functional Limitations Info  Sight, Hearing, Speech Sight Info: Adequate Hearing Info: Adequate Speech Info: Adequate    SPECIAL CARE FACTORS FREQUENCY  PT (By licensed PT), OT (By licensed OT)     PT Frequency: 5x OT Frequency: 5x            Contractures Contractures Info: Not present    Additional Factors Info  Code Status, Allergies Code Status Info: full code Allergies Info: Desvenlafaxine, Morphine And Related, Penicillins, Prazosin, Tamoxifen, Trazodone, Clonazepam, Codeine, Duloxetine, Gabapentin, Hydroxyzine, Lorazepam, Paroxetine Hcl, Sulfa Antibiotics, Cephalexin           Current Medications (04/28/2022):  This is the current hospital active medication list Current Facility-Administered Medications  Medication Dose Route Frequency Provider Last Rate Last Admin   0.9 %  sodium chloride infusion   Intravenous Continuous Annita Brod, MD 75 mL/hr at 04/28/22 0948 New Bag at 04/28/22 0948   acetaminophen (TYLENOL) tablet 650 mg  650 mg Oral Q6H PRN Sharion Settler, NP   650 mg at 04/28/22 0818   Or   acetaminophen (TYLENOL) suppository 650 mg  650 mg Rectal Q6H PRN Sharion Settler, NP       azithromycin [facility]) tablet 250 mg  250 mg Oral Once per day on Mon Wed Fri  Cox, Amy N, DO   250 mg at 04/28/22 7654   diazepam (VALIUM) tablet 2 mg  2 mg Oral Daily PRN Annita Brod, MD   2 mg at 04/27/22 2013   diphenhydrAMINE (BENADRYL) capsule 25 mg  25 mg Oral Q8H PRN Annita Brod, MD       enoxaparin (LOVENOX) injection 40 mg  40 mg Subcutaneous QHS Cox, Amy N, DO   40 mg at 04/27/22 2206   FLUoxetine (PROZAC) capsule 20 mg  20 mg Oral Q breakfast Cox, Amy N, DO   20 mg at 04/28/22 0806    fluticasone furoate-vilanterol (BREO ELLIPTA) 100-25 MCG/ACT 1 puff  1 puff Inhalation Daily Cox, Amy N, DO   1 puff at 04/28/22 0809   And   umeclidinium bromide (INCRUSE ELLIPTA) 62.5 MCG/ACT 1 puff  1 puff Inhalation Daily Cox, Amy N, DO   1 puff at 04/28/22 0809   guaiFENesin-dextromethorphan (ROBITUSSIN DM) 100-10 MG/5ML syrup 5 mL  5 mL Oral Q4H PRN Cox, Amy N, DO   5 mL at 04/28/22 0323   hydrocortisone cream 1 %   Topical QID Sharion Settler, NP   Given at 04/28/22 0811   ipratropium-albuterol (DUONEB) 0.5-2.5 (3) MG/3ML nebulizer solution 3 mL  3 mL Nebulization TID Cox, Amy N, DO   3 mL at 04/28/22 0727   melatonin tablet 5 mg  5 mg Oral QHS PRN Sharion Settler, NP   5 mg at 04/27/22 2206   nicotine (NICODERM CQ - dosed in mg/24 hours) patch 21 mg  21 mg Transdermal Daily PRN Cox, Amy N, DO   21 mg at 04/28/22 0616   ondansetron (ZOFRAN) tablet 4 mg  4 mg Oral Q6H PRN Cox, Amy N, DO   4 mg at 04/27/22 0513   Or   ondansetron (ZOFRAN) injection 4 mg  4 mg Intravenous Q6H PRN Cox, Amy N, DO   4 mg at 04/28/22 0911   polyethylene glycol (MIRALAX / GLYCOLAX) packet 17 g  17 g Oral Daily PRN Cox, Amy N, DO       predniSONE (DELTASONE) tablet 40 mg  40 mg Oral Q breakfast Annita Brod, MD   40 mg at 04/28/22 6503   vitamin B-12 (CYANOCOBALAMIN) tablet 500 mcg  500 mcg Oral Daily Cox, Amy N, DO   500 mcg at 04/28/22 0805     Discharge Medications: Please see discharge summary for a list of discharge medications.  Relevant Imaging Results:  Relevant Lab Results:   Additional Information TWS:[id number]  Gerrianne Scale Tamesha Ellerbrock, LCSW

## 2022-04-28 NOTE — TOC Initial Note (Addendum)
Transition of Care [facility]) - Initial/Assessment Note    Patient Details  Name: Colleen [name] MRN: [id number] Date of Birth: [birth year]  Transition of Care [facility]) CM/SW Contact:    Eileen Stanford, LCSW Phone Number: 04/28/2022, 1:02 PM  Clinical Narrative:    CSW spoke with pt regarding dc to SNF, pt is agreeable however states it will have to be covered by insurance because pt states she can not pay out of pocket.  Pt did not have a agency preference but does want it to be in [place]. CSW to provide bed offers once available.             CSW received a call from pt's spouse. Him and pt spoke and pt now wants [facility] and does not want to go to SNF. Pt has a walker and cane at home. Declining any further dme at this time.   CSW spoke with [facility] and they accept tricare. They will accept referral. MD notified.    Expected Discharge Plan: Skilled Nursing Facility Barriers to Discharge: Insurance Authorization   Patient Goals and CMS Choice Patient states their goals for this hospitalization and ongoing recovery are:: to get better   Choice offered to / list presented to : Patient  Expected Discharge Plan and Services Expected Discharge Plan: [place] In-house Referral: Clinical Social Work   Post Acute Care Choice: [place] Living arrangements for the past 2 months: [place]                                      Prior Living Arrangements/Services Living arrangements for the past 2 months: Single Family Home Lives with:: Spouse Patient language and need for interpreter reviewed:: Yes Do you feel safe going back to the place where you live?: Yes      Need for Family Participation in Patient Care: Yes (Comment) Care giver support system in place?: Yes (comment)   Criminal Activity/Legal Involvement Pertinent to Current Situation/Hospitalization: No - Comment as needed  Activities of Daily Living Home Assistive Devices/Equipment:  Cane (specify quad or straight), Dentures (specify type), Oxygen ADL Screening (condition at time of admission) Patient's cognitive ability adequate to safely complete daily activities?: Yes Is the patient deaf or have difficulty hearing?: No Does the patient have difficulty seeing, even when wearing glasses/contacts?: No Does the patient have difficulty concentrating, remembering, or making decisions?: No Patient able to express need for assistance with ADLs?: Yes Does the patient have difficulty dressing or bathing?: Yes Independently performs ADLs?: No Communication: Independent Dressing (OT): Independent Grooming: Independent Feeding: Independent Bathing: Needs assistance Is this a change from baseline?: Pre-admission baseline Toileting: Independent In/Out Bed: Independent Walks in Home: Independent with device (comment) Does the patient have difficulty walking or climbing stairs?: Yes Weakness of Legs: Both Weakness of Arms/Hands: None  Permission Sought/Granted Permission sought to share information with : Family Supports Permission granted to share information with : Yes, Verbal Permission Granted  Share Information with NAME: Herbie [place]     Permission granted to share info w Relationship: spouse     Emotional Assessment Appearance:: Appears stated age Attitude/Demeanor/Rapport: Engaged Affect (typically observed): Accepting Orientation: : Oriented to Self, Oriented to Place, Oriented to  Time, Oriented to Situation Alcohol / Substance Use: Not Applicable Psych Involvement: No (comment)  Admission diagnosis:  Hyponatremia [E87.1] COPD exacerbation (HCC) [J44.1] Acute on chronic respiratory failure with hypoxia ([place]) [J96.21]  Patient Active Problem List   Diagnosis Date Noted   MDD (major depressive disorder), recurrent episode, moderate ([place]) 04/17/2022   COPD exacerbation ([place]) 03/30/2022   Depression with anxiety 03/30/2022   Coronary artery disease 03/10/2022    Dependence on nocturnal oxygen therapy 03/10/2022   History of breast cancer 03/10/2022   At risk for prolonged QT interval syndrome 11/19/2021   Hyponatremia 10/19/2021   Tobacco use disorder 06/19/2021   Insomnia due to medical condition 03/21/2021   PTSD (post-traumatic stress disorder) 03/04/2021   Cervical myofascial pain syndrome 07/09/2020   Chronic pain syndrome 06/04/2020   Osteopenia of multiple sites 06/04/2020   Age related osteoporosis 06/04/2020   Low back pain 04/27/2020   Panic attack 03/20/2020   Acute on chronic respiratory failure with hypoxia (HCC)    COPD (chronic obstructive pulmonary disease) ([place]) 10/11/2018   GAD (generalized anxiety disorder) 10/11/2018   PCP:  Remi Haggard, FNP Pharmacy:   [id number] [id number]: [phone]: [phone]    Social Determinants of Health (SDOH) Interventions    Readmission Risk Interventions    03/10/2022    9:51 AM 02/14/2022    1:57 PM 12/06/2021   10:23 AM  Readmission Risk Prevention Plan  Transportation Screening Complete Complete Complete  PCP or Specialist Appt within 5-7 Days   Complete  Medication Review (RN CM)   Complete  Medication Review (RN Care Manager) Complete Complete   PCP or Specialist appointment within 3-5 days of discharge Complete Complete   SW Recovery Care/Counseling Consult Complete    Palliative Care Screening Not Applicable Not [place] Applic[place] Not Applicable

## 2022-04-28 NOTE — Progress Notes (Signed)
[facility] Progress Note  Patient: Colleen [name]    ZOX:[id number]  DOA: 04/23/2022    Date of Service: the patient was seen and examined on 04/28/2022  Brief hospital course: 61 year old female with past medical history of severe COPD on oxygen at night plus history of breast cancer, anxiety and depression presented to the emergency room on 6/14 with complaints of shortness of breath for several days.  Admitted for COPD exacerbation.   Assessment and Plan: Assessment and Plan: * COPD exacerbation ([place]) - Presumed secondary to ongoing tobacco use and daily exposure to secondhand smoking from her spouse.  Continue nebs and steroids.  Procalcitonin normal, so no need for antibiotics.  Patient still quite dyspneic.    She may have some underlying cor pulmonale.  Echocardiogram done last year noted preserved ejection fraction but indeterminate diastolic function.  BNP unremarkable.  Acute on chronic respiratory failure with hypoxia (HCC) Normally on 2 L only at night.  Initially had been weaning down, but by 6/17, still somewhat dyspneic.  With ambulation, O2 sats on 3 L at 84%, but with rest O2 sats at 93% (on 3L).  Repeat chest x-ray unremarkable.  Has responded little to IV Lasix and is down to 2 L nasal cannula.  Need to maximize ambulation and get out of bed to chair during the daytime.  Depression with anxiety - Resumed home fluoxetine 20 mg daily with breakfast, diazepam 2 mg p.o. daily as needed for anxiety. Given tachycardia from albuterol nebulizers as well as IV steroids, patient has felt quite anxious during hospitalization.   Need to prevent oversedation so have discontinued IV Ativan and resumed her once a day only Valium. Patient still feels quite anxious and now complains of itching all over.  Vital signs are stable.  Does not appear to be an allergy.  Have ordered some IV Benadryl and change back to IV steroids.  Hyponatremia - Suspect secondary to dehydration.  Sodium on  admission at 126.  Patient's sodiums from previous have ranged from 126-133.  Recheck in the morning.  I tried several days of Lasix to see if this would improve her breathing.  Sodium has dropped 125.  Have stopped Lasix and start gentle IV fluids.  GAD (generalized anxiety disorder) - Lorazepam 0.5 mg IV every 4 hours as needed for anxiety, 1 day ordered  Tobacco use disorder - Nicotine patch as needed ordered - Patient is not ready to quit  Chronic pain syndrome - Chronic back pain, patient stating 10 out of 10, on discussion with patient this is persistent and unchanged.  She states that she states that a 10 out of 10. - Patient also endorses that she would like to avoid opioid medication as it gives her anaphylactic shock and she has a sister who was a drug user therefore she is fearful of opioid medication - I counseled patient on alternating between an NSAID and Tylenol at home and using lidocaine patch - I have ordered Tylenol, lidocaine patch, and as needed Toradol during hospitalization.    MDD (major depressive disorder), recurrent episode, moderate (HCC) - Fluoxetine 20 mg daily with breakfast resumed       Body mass index is 22.6 kg/m.        Consultants: None  Procedures: None  Antimicrobials: None  Code Status: Full code   Subjective: Continued cough.  Feels very anxious.  Feels like she is itching all over.  Objective: Vital signs were reviewed and unremarkable. Vitals:   04/28/22 0819 04/28/22  1331  BP: 132/86   Pulse: 91   Resp: 20   Temp: 99 F (37.2 C)   SpO2: 98% 95%    Intake/Output Summary (Last 24 hours) at 04/28/2022 1532 Last data filed at 04/28/2022 1509 Gross per 24 hour  Intake 880.75 ml  Output 930 ml  Net -49.25 ml    Filed Weights   04/27/22 2040  Weight: 52.5 kg   Body mass index is 22.6 kg/m.  Exam:  General: Alert and oriented x3, mildly anxious HEENT: Normocephalic, atraumatic, mucous membranes are  moist Cardiovascular: Regular rate and rhythm, S1-S2 Respiratory: Decreased breath sounds throughout, RLL crackles, no wheezes Abdomen: Soft, nontender, nondistended, positive bowel sounds Musculoskeletal: No clubbing or cyanosis or edema Skin: No skin breaks, tears or lesions Psychiatry: Appropriate, no evidence of psychoses Neurology: No focal deficits  Data Reviewed: Sodium down to 125  Disposition:  Status is: Inpatient Patient refuses skilled nursing.  Refuses to work physical therapy.  We will plan to discharge home with home health    Anticipated discharge date: 6/20  Remaining issues to be resolved so that patient can be discharged: Improvement in sodium   Family Communication: Updated husband DVT Prophylaxis: enoxaparin (LOVENOX) injection 40 mg Start: 04/23/22 2200 Place TED hose Start: 04/23/22 1344    Author: Annita Brod ,MD 04/28/2022 3:32 PM  To reach On-call, see care teams to locate the attending and reach out via [email or web address]. Between 7PM-7AM, please contact night-coverage If you still have difficulty reaching the attending provider, please page the [facility] (Director on Call) for [facility] on amion for assistance.

## 2022-04-29 DIAGNOSIS — F17200 Nicotine dependence, unspecified, uncomplicated: Secondary | ICD-10-CM

## 2022-04-29 DIAGNOSIS — J9621 Acute and chronic respiratory failure with hypoxia: Secondary | ICD-10-CM | POA: Diagnosis not present

## 2022-04-29 DIAGNOSIS — E871 Hypo-osmolality and hyponatremia: Secondary | ICD-10-CM | POA: Diagnosis not present

## 2022-04-29 DIAGNOSIS — J441 Chronic obstructive pulmonary disease with (acute) exacerbation: Secondary | ICD-10-CM | POA: Diagnosis not present

## 2022-04-29 LAB — BASIC METABOLIC PANEL
Anion gap: 5 (ref 5–15)
BUN: 12 mg/dL (ref 8–23)
CO2: 27 mmol/L (ref 22–32)
Calcium: 8.8 mg/dL — ABNORMAL LOW (ref 8.9–10.3)
Chloride: 99 mmol/L (ref 98–111)
Creatinine, Ser: 0.41 mg/dL — ABNORMAL LOW (ref 0.44–1.00)
GFR, Estimated: >60 mL/min (ref >60)
Glucose, Bld: 81 mg/dL (ref 70–99)
Potassium: 4.6 mmol/L (ref 3.5–5.1)
Sodium: 131 mmol/L — ABNORMAL LOW (ref 135–145)

## 2022-04-29 MED ORDER — STERILE WATER FOR INJECTION IJ SOLN
INTRAMUSCULAR | Status: AC
  Administered 2022-04-29: 10 mL
  Filled 2022-04-29: qty 10

## 2022-04-29 NOTE — Discharge Summary (Signed)
Physician Discharge Summary   Patient: Colleen [name] MRN: [id number] DOB: [birth year]  Admit date:     04/23/2022  Discharge date: 04/29/22  Discharge Physician: Annita Brod   PCP: Remi Haggard, FNP   Recommendations at discharge:   New medication: Prednisone taper New medication: Lidoderm patch Patient being discharged with home physical therapy  Discharge Diagnoses: Principal Problem:   COPD exacerbation ([place]) Active Problems:   Acute on chronic respiratory failure with hypoxia ([place])   Depression with anxiety   Hyponatremia   Tobacco use disorder   Chronic pain syndrome  Resolved Problems:   * No resolved hospital problems. 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  Admitted for COPD exacerbation.   Assessment and Plan: * COPD exacerbation ([place]) - Presumed secondary to ongoing tobacco use and daily exposure to secondhand smoking from her spouse.  Continue nebs and steroids.  Procalcitonin normal, so no need for antibiotics.  Discharged on prednisone taper.  Echocardiogram done last year noted preserved ejection fraction but indeterminate diastolic function.  BNP unremarkable.  Acute on chronic respiratory failure with hypoxia (HCC) Normally on 2 L only at night.  Initially had been weaning down, but by 6/17, still somewhat dyspneic.  With ambulation, O2 sats on 3 L at 84%, but with rest O2 sats at 93% (on 3L).  Repeat chest x-ray unremarkable.  Has responded little to IV Lasix and is down to 2 L nasal cannula.  Need to improve overall deconditioning.  Patient was evaluated by PT recommended skilled nursing which patient declined.  Setting up home health PT.  By day of discharge, patient oxygenating at 97% on 2 L nasal cannula  Depression with anxiety - Resumed  home fluoxetine 20 mg daily with breakfast, diazepam 2 mg p.o. daily as needed for anxiety. Given tachycardia from albuterol nebulizers as well as IV steroids, patient has felt quite anxious during hospitalization.  Hyponatremia - Suspect secondary to dehydration.  Sodium on admission at 126.  Patient's sodiums from previous have ranged from 126-133.  Recheck in the morning.  I tried several days of Lasix to see if this would improve her breathing.  Sodium had dropped to 125 on 6/20.  Stopped Lasix and started gentle IV fluids.  By following day, 6/20, sodium had improved to 131.  GAD (generalized anxiety disorder) - Lorazepam 0.5 mg IV every 4 hours as needed for anxiety, 1 day ordered  Tobacco use disorder - Nicotine patch as needed ordered - Patient is not ready to quit  Chronic pain syndrome - Chronic back pain, patient stating 10 out of 10, on discussion with patient this is persistent and unchanged.  She states that she states that a 10 out of 10. - Patient also endorses that she would like to avoid opioid medication as it gives her anaphylactic shock and she has a sister who was a drug user therefore she is fearful of opioid medication - I counseled patient on alternating between an NSAID and Tylenol at home and using lidocaine patch, and providing a lidocaine patch upon discharge.  MDD (major depressive disorder), recurrent episode, moderate (HCC) - Fluoxetine 20 mg daily with breakfast resumed         Consultants: None Procedures performed: None Disposition: Home health Diet recommendation:  Discharge Diet Orders (From admission, onward)  Start     Ordered   04/25/22 0000  Diet - low sodium heart healthy        04/25/22 1426           Cardiac diet DISCHARGE MEDICATION: Allergies as of 04/29/2022       Reactions   Desvenlafaxine Anaphylaxis   Morphine And Related Anaphylaxis   Penicillins Anaphylaxis   Has patient had a PCN reaction causing immediate rash,  facial/tongue/throat swelling, SOB or lightheadedness with hypotension: Yes Has patient had a PCN reaction causing severe rash involving mucus membranes or skin necrosis: No Has patient had a PCN reaction that required hospitalization: Unknown Has patient had a PCN reaction occurring within the last 10 years: No If all of the above answers are "NO", then may proceed with Cephalosporin use.   Prazosin Other (See Comments)   Tamoxifen Anaphylaxis   Trazodone Anaphylaxis   Clonazepam    Codeine Swelling   Duloxetine Itching   Gabapentin Hives   Rash and swelling.    Hydroxyzine Itching   Lorazepam    Patient feels this medications makes her to sedated and wants to avoid   Paroxetine Hcl Hives   Sulfa Antibiotics Itching   Cephalexin Rash        Medication List     TAKE these medications    albuterol 108 (90 Base) MCG/ACT inhaler Commonly known as: VENTOLIN HFA Inhale 1-2 puffs into the lungs every 4 (four) hours as needed for cough or wheezing.   azithromycin 250 MG tablet Commonly known as: ZITHROMAX Take 1 tablet by mouth. Every Monday, Wednesday and friday   cholecalciferol 25 MCG (1000 UNIT) tablet Commonly known as: VITAMIN D3 Take 1,000 Units by mouth daily.   cyclobenzaprine 10 MG tablet Commonly known as: FLEXERIL Take 1 tablet (10 mg total) by mouth 3 (three) times daily as needed for muscle spasms.   denosumab 60 MG/ML Sosy injection Commonly known as: PROLIA Inject 60 mg into the skin every 6 (six) months.   diazepam 2 MG tablet Commonly known as: VALIUM Take 1 tablet (2 mg total) by mouth daily as needed for anxiety. For severe panic symptoms   EPINEPHrine 0.3 mg/0.3 mL Soaj injection Commonly known as: EPI-PEN Inject 0.3 mg into the muscle as needed for anaphylaxis.   FLUoxetine 20 MG tablet Commonly known as: PROZAC Take 1 tablet (20 mg total) by mouth daily with breakfast.   ipratropium 0.02 % nebulizer solution Commonly known as:  ATROVENT Take 0.5 mg by nebulization 4 (four) times daily.   lidocaine 5 % Commonly known as: LIDODERM Place 1 patch onto the skin daily. Apply to lower back. Remove & Discard patch within 12 hours or as directed by MD   predniSONE 10 MG tablet Commonly known as: DELTASONE Take 6 tablets (60 mg total) by mouth daily with breakfast for 1 day, THEN 5 tablets (50 mg total) daily with breakfast for 1 day, THEN 4 tablets (40 mg total) daily with breakfast for 1 day, THEN 2 tablets (20 mg total) daily with breakfast for 1 day, THEN 1 tablet (10 mg total) daily with breakfast for 1 day. Start taking on: April 26, 2022 What changed:  medication strength See the new instructions.   Trelegy Ellipta 100-62.5-25 MCG/ACT Aepb Generic drug: Fluticasone-Umeclidin-Vilant Inhale 1 puff into the lungs daily.   vitamin B-12 500 MCG tablet Commonly known as: CYANOCOBALAMIN Take 500 mcg by mouth daily.        Discharge Exam: [employer]  04/27/22 2040  Weight: 52.5 kg   General: Alert and oriented x3 Cardiovascular: Regular rate and rhythm, S1-S2 Lungs: Decreased breath sounds throughout  Condition at discharge: good  The results of significant diagnostics from this hospitalization (including imaging, microbiology, ancillary and laboratory) are listed below for reference.   Imaging Studies: DG Chest 2 View  Result Date: 04/26/2022 CLINICAL DATA:  Cough. EXAM: CHEST - 2 VIEW COMPARISON:  04/23/2022 and prior studies FINDINGS: Cardiomediastinal silhouette is stable. Emphysema again identified. There is no evidence of focal airspace disease, pulmonary edema, suspicious pulmonary nodule/mass, pleural effusion, or pneumothorax. No acute bony abnormalities are identified. IMPRESSION: Emphysema without evidence of acute cardiopulmonary disease. Electronically Signed   By: Margarette Canada M.D.   On: 04/26/2022 14:49   DG Chest Portable 1 View  Result Date: 04/23/2022 CLINICAL DATA:  Shortness of  breath EXAM: PORTABLE CHEST 1 VIEW COMPARISON:  04/15/2022 FINDINGS: The heart size and mediastinal contours are within normal limits. Emphysema. Diffuse bilateral interstitial pulmonary opacity. The visualized skeletal structures are unremarkable. IMPRESSION: Emphysema with diffuse bilateral interstitial pulmonary opacity, which may reflect edema or atypical/viral infection. No focal airspace opacity. Electronically Signed   By: Delanna Ahmadi M.D.   On: 04/23/2022 10:31   DG Chest Portable 1 View  Result Date: 04/15/2022 CLINICAL DATA:  Shortness of breath EXAM: PORTABLE CHEST 1 VIEW COMPARISON:  04/01/2022 FINDINGS: Generous lung volumes with apical emphysematous markings. There is no edema, consolidation, effusion, or pneumothorax. Normal heart size and mediastinal contours. IMPRESSION: COPD without acute superimposed finding. Electronically Signed   By: Jorje Guild M.D.   On: 04/15/2022 05:24   CT Soft Tissue Neck Wo Contrast  Result Date: 04/04/2022 CLINICAL DATA:  Neck pain. EXAM: CT NECK WITHOUT CONTRAST TECHNIQUE: Multidetector CT imaging of the neck was performed following the standard protocol without intravenous contrast. RADIATION DOSE REDUCTION: This exam was performed according to the departmental dose-optimization program which includes automated exposure control, adjustment of the mA and/or kV according to patient size and/or use of iterative reconstruction technique. COMPARISON:  None Available. FINDINGS: Pharynx and larynx: No focal mucosal or submucosal lesions are present. The airway is patent. Nasopharynx is within normal limits. Soft palate and tongue base are within normal limits. Vallecula and epiglottis are within normal limits. Aryepiglottic folds and piriform sinuses are clear. Vocal cords are midline and symmetric. Trachea is clear. Salivary glands: Submandibular and parotid glands are within normal limits. Thyroid: Normal Lymph nodes: No significant cervical adenopathy is  present. Vascular: Atherosclerotic calcifications are present at the carotid bifurcations bilaterally without definite stenosis. Limited intracranial: Within normal limits. Limited by patient motion. Mastoids and visualized paranasal sinuses: The paranasal sinuses and mastoid air cells are clear. Skeleton: Multilevel degenerative changes are present. Bone detail limited by patient motion. Upper chest: Centrilobular emphysematous changes are present. Lung apices are clear. Thoracic inlet is within normal limits. IMPRESSION: 1. No acute or significant lesion in the neck to explain the patient's symptoms. The airway is widely patent. 2. Multilevel degenerative changes in the cervical spine. 3. Emphysema (ICD10-J43.9). 4. The study is somewhat degraded by patient motion, decreasing sensitivity for small lesions. Electronically Signed   By: San Morelle M.D.   On: 04/04/2022 16:50   DG Lumbar Spine Complete  Result Date: 04/04/2022 CLINICAL DATA:  Progressive low back pain. EXAM: LUMBAR SPINE - COMPLETE 4+ VIEW COMPARISON:  MRI lumbar spine 06/02/2020 FINDINGS: There is no evidence of lumbar spine fracture. Alignment is normal. Intervertebral disc spaces are maintained. Slight  rightward curvature is again noted. Atherosclerotic calcifications are present in the aorta without aneurysm. IMPRESSION: 1. No acute or focal abnormality of the lumbar spine. 2. Atherosclerosis. Electronically Signed   By: San Morelle M.D.   On: 04/04/2022 13:15   DG Thoracic Spine 2 View  Result Date: 04/04/2022 CLINICAL DATA:  Progressive back pain EXAM: THORACIC SPINE 2 VIEWS COMPARISON:  CT a chest 02/22/2022. Two-view chest x-ray 03/20/2022. FINDINGS: Multilevel degenerative changes are present in the thoracic spine with exaggerated thoracic kyphosis. Vertebral body heights are maintained. No acute abnormalities are present. No significant listhesis is present. Mild leftward curvature is present in the lower thoracic  spine. IMPRESSION: 1. Multilevel degenerative changes in the thoracic spine are stable. 2. No acute fracture. Electronically Signed   By: San Morelle M.D.   On: 04/04/2022 13:13   DG Chest 2 View  Result Date: 04/01/2022 CLINICAL DATA:  Shortness of breath for a few days. EXAM: CHEST - 2 VIEW COMPARISON:  03/30/2022 FINDINGS: Heart size and pulmonary vascularity are normal. Bronchiectasis and peribronchial thickening consistent with chronic bronchitis. No airspace disease or consolidation. No pleural effusions. No pneumothorax. Mediastinal contours appear intact. Healing fracture of the right posterior seventh rib. Degenerative changes in the spine. IMPRESSION: Bronchiectasis and chronic bronchitic changes in the lungs. No edema or consolidation. Electronically Signed   By: Lucienne Capers M.D.   On: 04/01/2022 18:21    Microbiology: Results for orders placed or performed during the hospital encounter of 04/15/22  Resp Panel by RT-PCR (Flu A&B, Covid) Anterior Nasal Swab     Status: None   Collection Time: 04/15/22  9:00 AM   Specimen: Anterior Nasal Swab  Result Value Ref Range Status   SARS Coronavirus 2 by RT PCR NEGATIVE NEGATIVE Final    Comment: (NOTE) SARS-CoV-2 target nucleic acids are NOT DETECTED.  The SARS-CoV-2 RNA is generally detectable in upper respiratory specimens during the acute phase of infection. The lowest concentration of SARS-CoV-2 viral copies this assay can detect is 138 copies/mL. A negative result does not preclude SARS-Cov-2 infection and should not be used as the sole basis for treatment or other patient management decisions. A negative result may occur with  improper specimen collection/handling, submission of specimen other than nasopharyngeal swab, presence of viral mutation(s) within the areas targeted by this assay, and inadequate number of viral copies(<138 copies/mL). A negative result must be combined with clinical observations, patient  history, and epidemiological information. The expected result is Negative.  Fact Sheet for Patients:  EntrepreneurPulse.com.au  Fact Sheet for Healthcare Providers:  IncredibleEmployment.be  This test is no t yet approved or cleared by the [place] FDA and  has been authorized for detection and/or diagnosis of SARS-CoV-2 by FDA under an Emergency Use Authorization (EUA). This EUA will remain  in effect (meaning this test can be used) for the duration of the COVID-19 declaration under Section 564(b)(1) of the Act, 21 U.S.C.section 360bbb-3(b)(1), unless the authorization is terminated  or revoked sooner.       Influenza A by PCR NEGATIVE NEGATIVE Final   Influenza B by PCR NEGATIVE NEGATIVE Final    Comment: (NOTE) The Xpert Xpress SARS-CoV-2/FLU/RSV plus assay is intended as an aid in the diagnosis of influenza from Nasopharyngeal swab specimens and should not be used as a sole basis for treatment. Nasal washings and aspirates are unacceptable for Xpert Xpress SARS-CoV-2/FLU/RSV testing.  Fact Sheet for Patients: EntrepreneurPulse.com.au  Fact Sheet for Healthcare Providers: IncredibleEmployment.be  This test is not  yet approved or cleared by the [place] and has been authorized for detection and/or diagnosis of SARS-CoV-2 by FDA under an Emergency Use Authorization (EUA). This EUA will remain in effect (meaning this test can be used) for the duration of the COVID-19 declaration under Section 564(b)(1) of the Act, 21 U.S.C. section 360bbb-3(b)(1), unless the authorization is terminated or revoked.  Performed at [facility], [address] [place], [place] [address]     Labs: CBC: Recent Labs  Lab 04/23/22 1035 04/24/22 0442  WBC 6.0 6.6  HGB 14.0 12.0  HCT 41.3 36.6  MCV 91.0 91.3  PLT 294 757   Basic Metabolic Panel: Recent Labs  Lab 04/24/22 0442  04/25/22 0624 04/27/22 0431 04/28/22 0414 04/29/22 0521  NA 129* 128* 126* 125* 131*  K 4.3 4.6 4.1 4.3 4.6  CL 98 93* 89* 88* 99  CO2 '25 28 31 29 27  '$ GLUCOSE 106* 108* 79 86 81  BUN '12 9 11 14 12  '$ CREATININE 0.45 0.33* 0.35* 0.44 0.41*  CALCIUM 8.6* 9.0 8.5* 9.1 8.8*   Liver Function Tests: No results for input(s): "AST", "ALT", "ALKPHOS", "BILITOT", "PROT", "ALBUMIN" in the last 168 hours. CBG: No results for input(s): "GLUCAP" in the last 168 hours.  Discharge time spent: less than 30 minutes.  Signed: Annita Brod, MD [facility] 04/29/2022

## 2022-04-29 NOTE — TOC Transition Note (Signed)
Transition of Care [facility]) - CM/SW Discharge Note   Patient Details  Name: [name] MRN: [id number] Date of Birth: [birth year]  Transition of Care [facility]) CM/SW Contact:  Eileen Stanford, LCSW Phone Number: 04/29/2022, 11:17 AM   Clinical Narrative:   [place] arranged through [facility]. Pt dc today.    Final next level of care: [place] Barriers to Discharge: No Barriers Identified   Patient Goals and CMS Choice Patient states their goals for this hospitalization and ongoing recovery are:: to get better   Choice offered to / list presented to : Patient  Discharge Placement                    Patient and family notified of of transfer: 04/29/22  Discharge Plan and Services In-house Referral: Clinical Social Work   Post Acute Care Choice: [place]                               Social Determinants of Health (SDOH) Interventions     Readmission Risk Interventions    03/10/2022    9:51 AM 02/14/2022    1:57 PM 12/06/2021   10:23 AM  Readmission Risk Prevention Plan  Transportation Screening Complete Complete Complete  PCP or Specialist Appt within 5-7 Days   Complete  Medication Review (RN CM)   Complete  Medication Review [employer]) Complete Complete   PCP or Specialist appointment within 3-5 days of discharge Complete Complete   SW Recovery Care/Counseling Consult Complete    Palliative Care Screening Not Applicable Not [place] Not Applicable Not Applicable

## 2022-04-29 NOTE — Plan of Care (Signed)
  Problem: Education: Goal: Knowledge of General Education information will improve Description: Including pain rating scale, medication(s)/side effects and non-pharmacologic comfort measures Outcome: Progressing   Problem: Health Behavior/Discharge Planning: Goal: Ability to manage health-related needs will improve Outcome: Progressing   Problem: Clinical Measurements: Goal: Respiratory complications will improve Outcome: Progressing   Problem: Activity: Goal: Risk for activity intolerance will decrease Outcome: Progressing   Problem: Coping: Goal: Level of anxiety will decrease Outcome: Progressing   Problem: Elimination: Goal: Will not experience complications related to bowel motility Outcome: Progressing Goal: Will not experience complications related to urinary retention Outcome: Progressing   Problem: Safety: Goal: Ability to remain free from injury will improve Outcome: Progressing   Problem: Pain Managment: Goal: General experience of comfort will improve Outcome: Progressing

## 2022-04-29 NOTE — Progress Notes (Signed)
Patient being discharged home with [facility]. IV removed. Went over discharge instructions and medications with patient. She stated she understood. Patient going home POV with husband.

## 2022-04-29 NOTE — Plan of Care (Signed)

## 2022-05-01 ENCOUNTER — Other Ambulatory Visit: Payer: Self-pay

## 2022-05-01 ENCOUNTER — Observation Stay
Admission: EM | Admit: 2022-05-01 | Discharge: 2022-05-02 | Disposition: A | Discharge status: Home Health | Attending: Obstetrics and Gynecology | Admitting: Obstetrics and Gynecology

## 2022-05-01 ENCOUNTER — Emergency Department

## 2022-05-01 ENCOUNTER — Encounter: Payer: Self-pay | Admitting: Intensive Care

## 2022-05-01 ENCOUNTER — Observation Stay

## 2022-05-01 DIAGNOSIS — R531 Weakness: Secondary | ICD-10-CM

## 2022-05-01 DIAGNOSIS — I2510 Atherosclerotic heart disease of native coronary artery without angina pectoris: Secondary | ICD-10-CM | POA: Insufficient documentation

## 2022-05-01 DIAGNOSIS — R0602 Shortness of breath: Secondary | ICD-10-CM | POA: Diagnosis present

## 2022-05-01 DIAGNOSIS — G894 Chronic pain syndrome: Secondary | ICD-10-CM | POA: Diagnosis present

## 2022-05-01 DIAGNOSIS — Z20822 Contact with and (suspected) exposure to covid-19: Secondary | ICD-10-CM | POA: Diagnosis not present

## 2022-05-01 DIAGNOSIS — Z9981 Dependence on supplemental oxygen: Secondary | ICD-10-CM | POA: Insufficient documentation

## 2022-05-01 DIAGNOSIS — J45909 Unspecified asthma, uncomplicated: Secondary | ICD-10-CM | POA: Insufficient documentation

## 2022-05-01 DIAGNOSIS — F17210 Nicotine dependence, cigarettes, uncomplicated: Secondary | ICD-10-CM | POA: Insufficient documentation

## 2022-05-01 DIAGNOSIS — J449 Chronic obstructive pulmonary disease, unspecified: Secondary | ICD-10-CM | POA: Diagnosis present

## 2022-05-01 DIAGNOSIS — F17200 Nicotine dependence, unspecified, uncomplicated: Secondary | ICD-10-CM | POA: Diagnosis present

## 2022-05-01 DIAGNOSIS — J441 Chronic obstructive pulmonary disease with (acute) exacerbation: Secondary | ICD-10-CM | POA: Diagnosis not present

## 2022-05-01 DIAGNOSIS — F410 Panic disorder [episodic paroxysmal anxiety] without agoraphobia: Secondary | ICD-10-CM | POA: Diagnosis not present

## 2022-05-01 DIAGNOSIS — F411 Generalized anxiety disorder: Secondary | ICD-10-CM | POA: Diagnosis present

## 2022-05-01 DIAGNOSIS — R202 Paresthesia of skin: Secondary | ICD-10-CM | POA: Diagnosis not present

## 2022-05-01 DIAGNOSIS — Z853 Personal history of malignant neoplasm of breast: Secondary | ICD-10-CM | POA: Diagnosis not present

## 2022-05-01 DIAGNOSIS — R200 Anesthesia of skin: Secondary | ICD-10-CM | POA: Diagnosis not present

## 2022-05-01 DIAGNOSIS — F331 Major depressive disorder, recurrent, moderate: Secondary | ICD-10-CM | POA: Diagnosis present

## 2022-05-01 DIAGNOSIS — F4310 Post-traumatic stress disorder, unspecified: Secondary | ICD-10-CM | POA: Diagnosis present

## 2022-05-01 DIAGNOSIS — G4701 Insomnia due to medical condition: Secondary | ICD-10-CM | POA: Diagnosis present

## 2022-05-01 DIAGNOSIS — F418 Other specified anxiety disorders: Secondary | ICD-10-CM | POA: Diagnosis present

## 2022-05-01 LAB — COMPREHENSIVE METABOLIC PANEL WITH GFR
ALT: 24 U/L (ref 0–44)
AST: 24 U/L (ref 15–41)
Albumin: 4.2 g/dL (ref 3.5–5.0)
Alkaline Phosphatase: 31 U/L — ABNORMAL LOW (ref 38–126)
Anion gap: 9 (ref 5–15)
BUN: 8 mg/dL (ref 8–23)
CO2: 26 mmol/L (ref 22–32)
Calcium: 9.1 mg/dL (ref 8.9–10.3)
Chloride: 93 mmol/L — ABNORMAL LOW (ref 98–111)
Creatinine, Ser: 0.45 mg/dL (ref 0.44–1.00)
GFR, Estimated: >60 mL/min (ref >60)
Glucose, Bld: 123 mg/dL — ABNORMAL HIGH (ref 70–99)
Potassium: 4.4 mmol/L (ref 3.5–5.1)
Sodium: 128 mmol/L — ABNORMAL LOW (ref 135–145)
Total Bilirubin: 0.8 mg/dL (ref 0.3–1.2)
Total Protein: 7 g/dL (ref 6.5–8.1)

## 2022-05-01 LAB — CBC WITH DIFFERENTIAL/PLATELET
Abs Immature Granulocytes: 0.06 K/uL (ref 0.00–0.07)
Basophils Absolute: 0.1 K/uL (ref 0.0–0.1)
Basophils Relative: 1 %
Eosinophils Absolute: 0.2 K/uL (ref 0.0–0.5)
Eosinophils Relative: 2 %
HCT: 39.7 % (ref 36.0–46.0)
Hemoglobin: 13.3 g/dL (ref 12.0–15.0)
Immature Granulocytes: 1 %
Lymphocytes Relative: 11 %
Lymphs Abs: 1.3 K/uL (ref 0.7–4.0)
MCH: 30.5 pg (ref 26.0–34.0)
MCHC: 33.5 g/dL (ref 30.0–36.0)
MCV: 91.1 fL (ref 80.0–100.0)
Monocytes Absolute: 0.7 K/uL (ref 0.1–1.0)
Monocytes Relative: 6 %
Neutro Abs: 9.2 K/uL — ABNORMAL HIGH (ref 1.7–7.7)
Neutrophils Relative %: 79 %
Platelets: 312 K/uL (ref 150–400)
RBC: 4.36 MIL/uL (ref 3.87–5.11)
RDW: 13.2 % (ref 11.5–15.5)
WBC: 11.5 K/uL — ABNORMAL HIGH (ref 4.0–10.5)
nRBC: 0 % (ref 0.0–0.2)

## 2022-05-01 LAB — TROPONIN I (HIGH SENSITIVITY)
Troponin I (High Sensitivity): 7 ng/L (ref <18)
Troponin I (High Sensitivity): 7 ng/L (ref <18)

## 2022-05-01 LAB — PROCALCITONIN: Procalcitonin: <0.1 ng/mL

## 2022-05-01 LAB — VITAMIN B12: Vitamin B-12: 470 pg/mL (ref 180–914)

## 2022-05-01 LAB — BRAIN NATRIURETIC PEPTIDE: B Natriuretic Peptide: 25.6 pg/mL (ref 0.0–100.0)

## 2022-05-01 LAB — CK: Total CK: 57 U/L (ref 38–234)

## 2022-05-01 LAB — SARS CORONAVIRUS 2 BY RT PCR: SARS Coronavirus 2 by RT PCR: NEGATIVE

## 2022-05-01 LAB — MAGNESIUM: Magnesium: 2.4 mg/dL (ref 1.7–2.4)

## 2022-05-01 IMAGING — MR MR THORACIC SPINE W/O CM
6 series · 34 of 48 positions shown · non-contrast
Comparison: CT thoracic spine [DATE]

CLINICAL DATA: Mid back pain, neuro deficit. History of breast
cancer.

EXAM:
MRI THORACIC SPINE WITHOUT CONTRAST
TECHNIQUE: Multiplanar, multisequence MR imaging of the thoracic spine was
performed. No intravenous contrast was administered.

[Series 18: T1 · sagittal · 6.0mm · 1.88mm/px · 2 of 9 slices shown (1 of 2)]
[im 1/9]
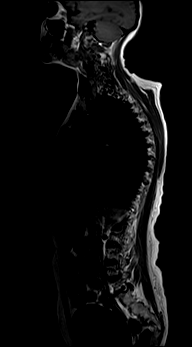
[im 9/9]
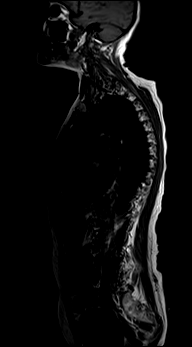

[Series 19: T2 · sagittal · 3.0mm · 1.33mm/px · 6 of 17 slices shown (1 of 2)]
[im 1/17]
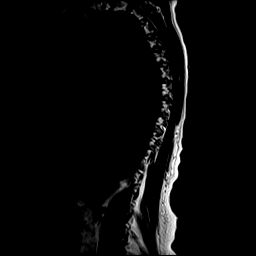
[im 4/17]
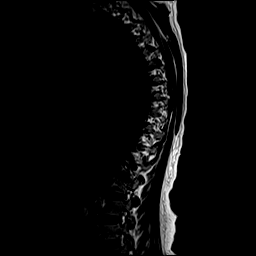
[im 7/17]
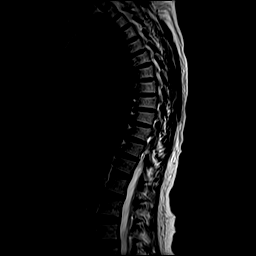
[im 10/17]
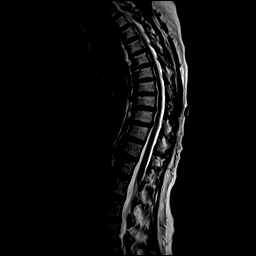
[im 13/17]
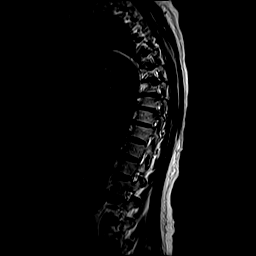
[im 17/17]
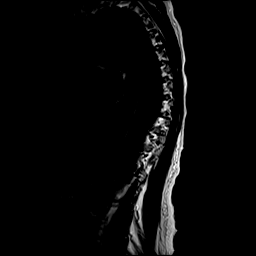

[Series 20: T1 · sagittal · 3.0mm · 1.33mm/px · 6 of 17 slices shown (2 of 2)]
[im 1/17]
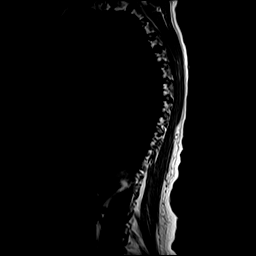
[im 4/17]
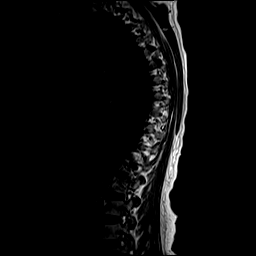
[im 7/17]
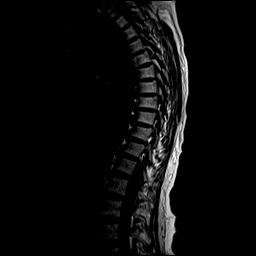
[im 10/17]
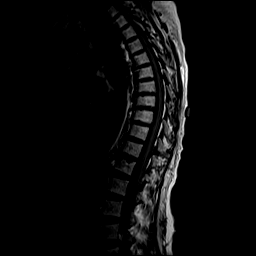
[im 13/17]
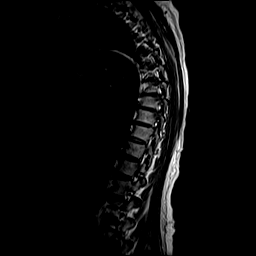
[im 17/17]
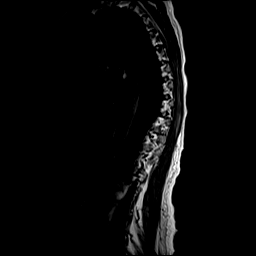

[Series 21: STIR · sagittal · 3.0mm · 0.66mm/px · 6 of 17 slices shown]
[im 1/17]
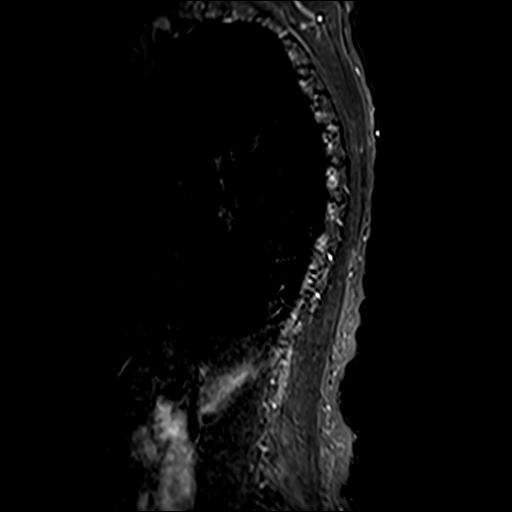
[im 4/17]
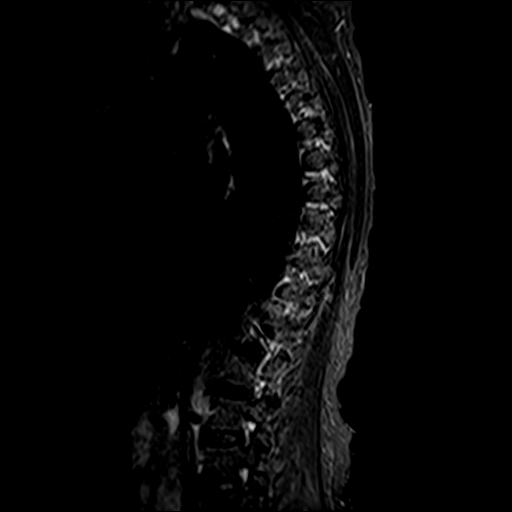
[im 7/17]
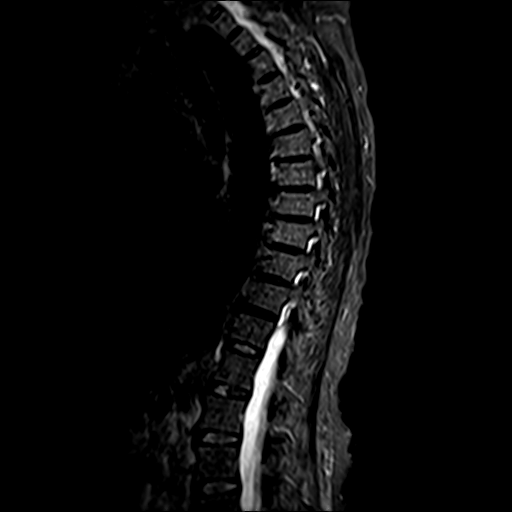
[im 10/17]
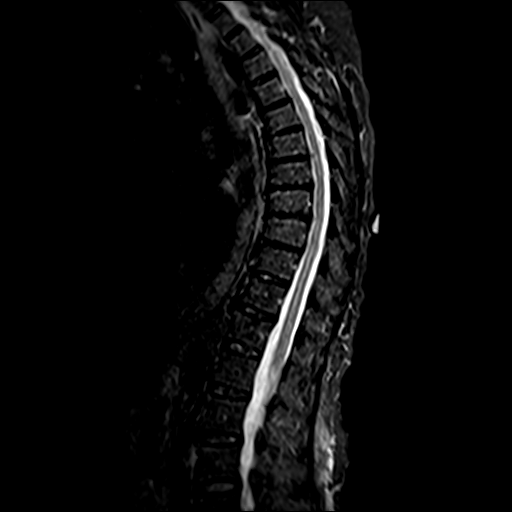
[im 13/17]
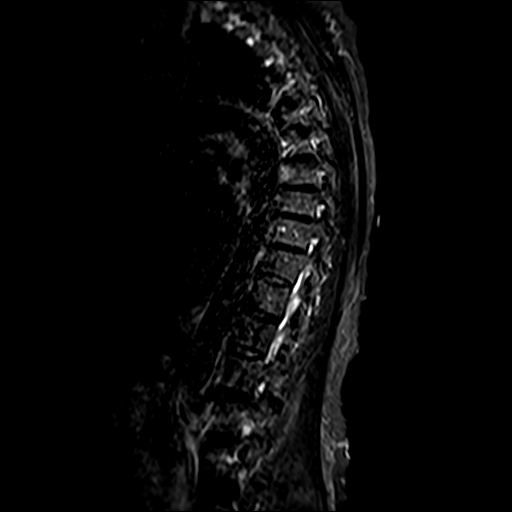
[im 17/17]
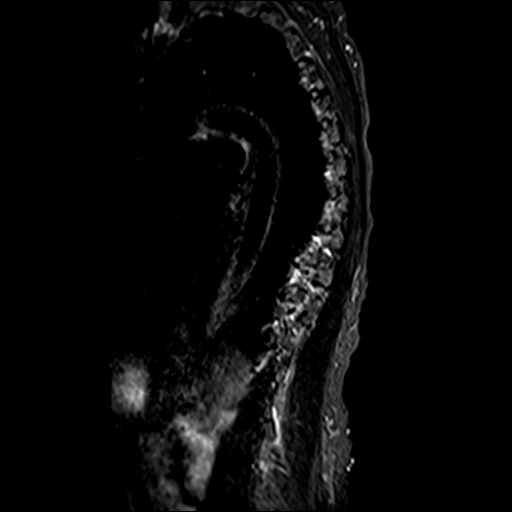

[Series 22: T2 · axial · 4.0mm · 0.59mm/px · z∈[-347,-194]mm · 8 of 39 slices shown (2 of 2)]
[im 1/39]
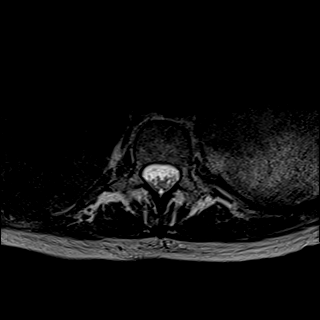
[im 6/39]
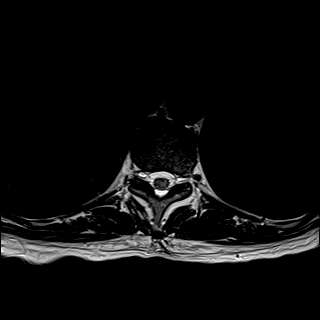
[im 12/39]
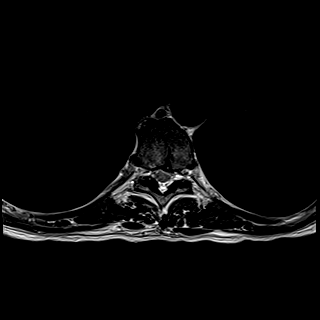
[im 18/39]
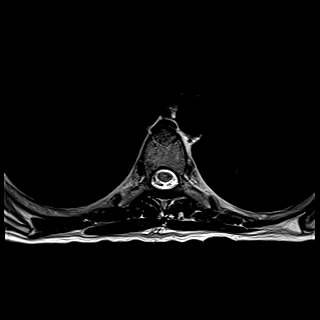
[im 21/39]
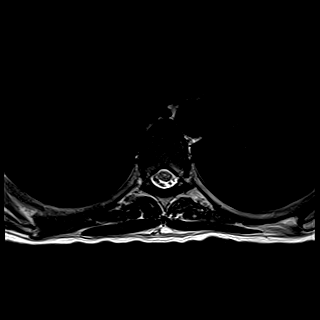
[im 27/39]
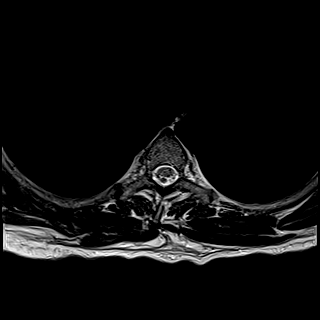
[im 33/39]
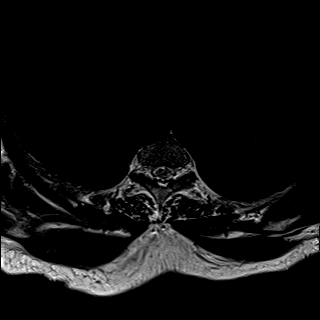
[im 39/39]
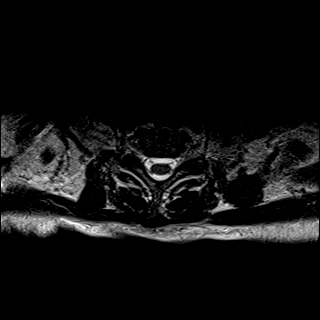

[Series 23: GRE · axial · 4.0mm · 0.37mm/px · z∈[-347,-237]mm · 6 of 39 slices shown]
[im 1/39]
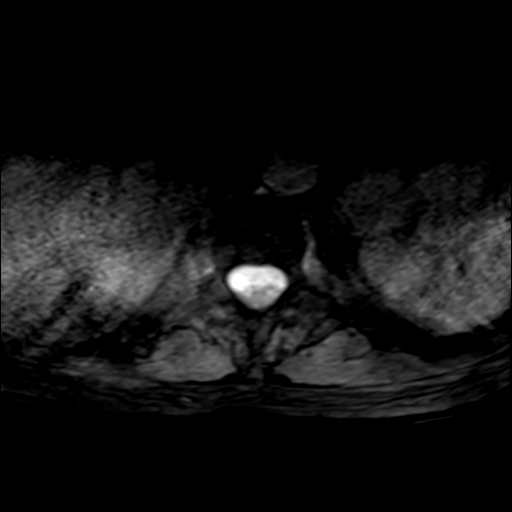
[im 6/39]
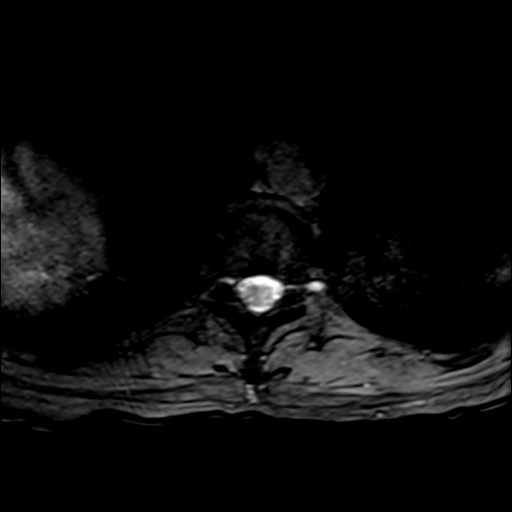
[im 12/39]
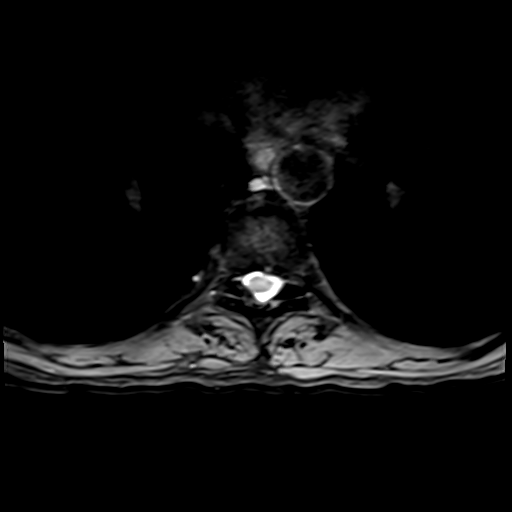
[im 18/39]
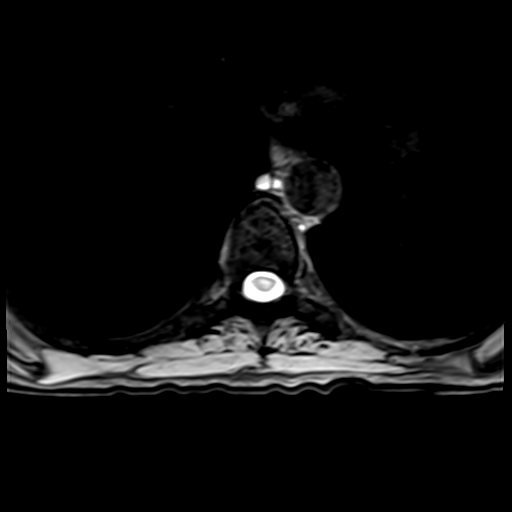
[im 21/39]
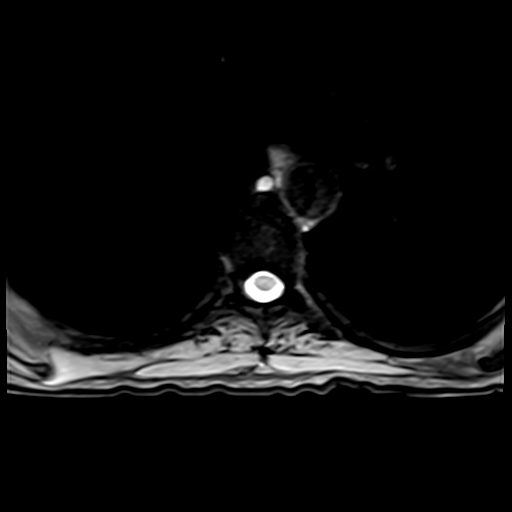
[im 27/39]
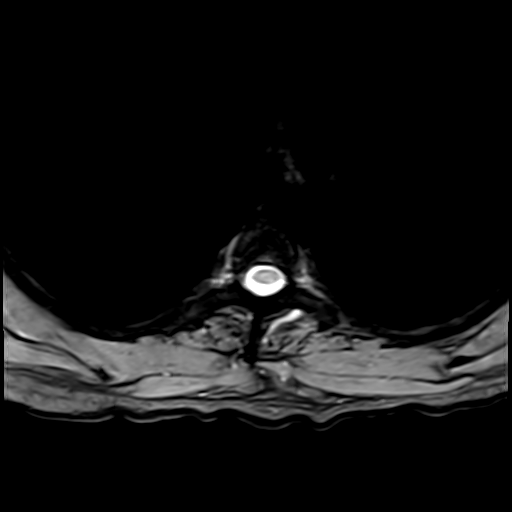

[34 of 48 positions shown; findings below may reference images not displayed]

FINDINGS: Alignment:  Image quality degraded by motion.

Normal alignment

Vertebrae: Negative for metastatic disease or fracture. Normal bone
marrow.

Cord: Cord evaluation limited by motion. No cord lesion identified.

Paraspinal and other soft tissues: Negative

Disc levels:

Multilevel disc degeneration.  Negative for stenosis.

Small left-sided disc protrusion T7-8.

Small central disc protrusion T8-9

Small left paracentral disc protrusion T9-10
IMPRESSION: Mild degenerative change thoracic spine

Negative for fracture or metastatic disease.

Negative for neural impingement.

## 2022-05-01 IMAGING — CT CT T SPINE W/O CM
3 of 5 series · 11 of 33 positions shown, 13 images · IV contrast (APPLIED)
Comparison: None Available.

CLINICAL DATA: Pain

EXAM:
CT Thoracic and Lumbar spine with contrast
TECHNIQUE: Multiplanar CT images of the thoracic and lumbar spine were
reconstructed from contemporary CT of the Chest, Abdomen, and
Pelvis.
RADIATION DOSE REDUCTION: This exam was performed according to the
departmental dose-optimization program which includes automated
exposure control, adjustment of the mA and/or kV according to
patient size and/or use of iterative reconstruction technique.
CONTRAST:  No additional

[Series 5: axial spine bone · axial · 0.37mm/px · z∈[-544,-310]mm · 3 of 235 slices shown, 4 images]
[im 59/235  soft-tissue]
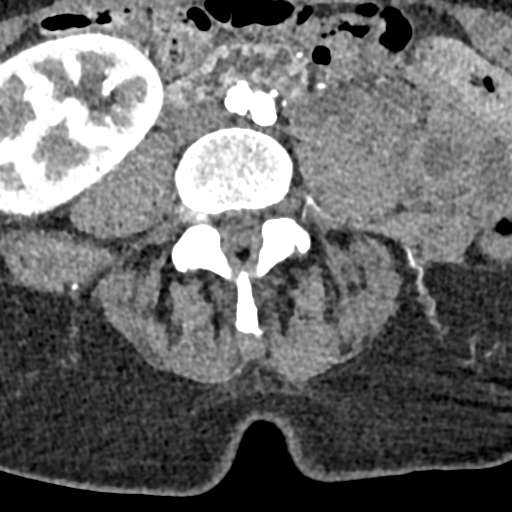
[im 59/235  bone]
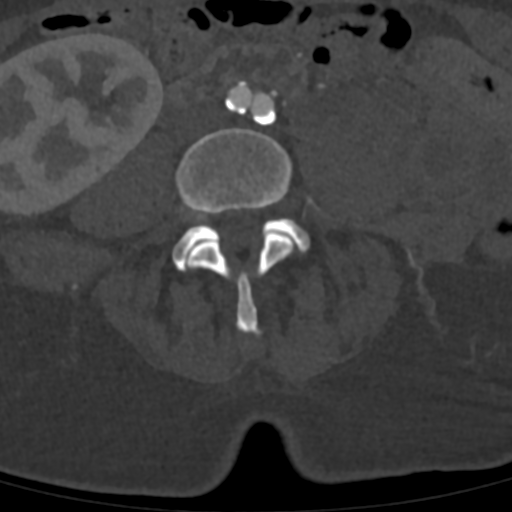
[im 118/235  bone]
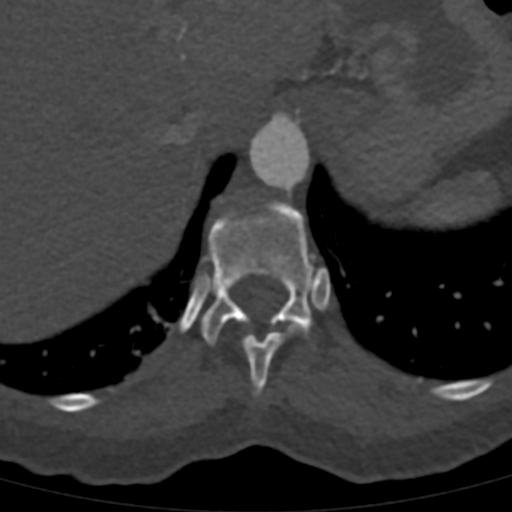
[im 176/235  bone]
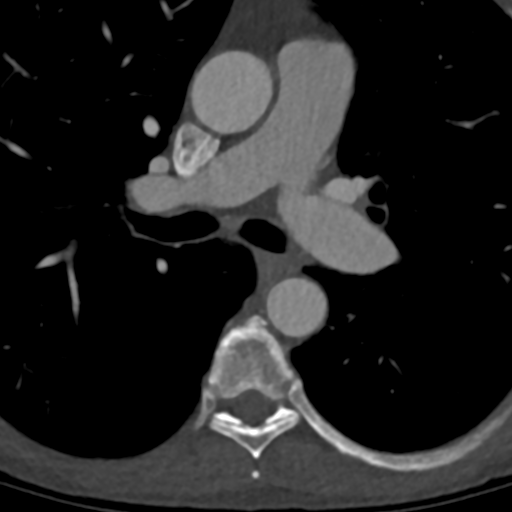

[Series 7: cor t-spine bone · coronal · 0.37mm/px · 3 of 88 slices shown]
[im 18/88  bone]
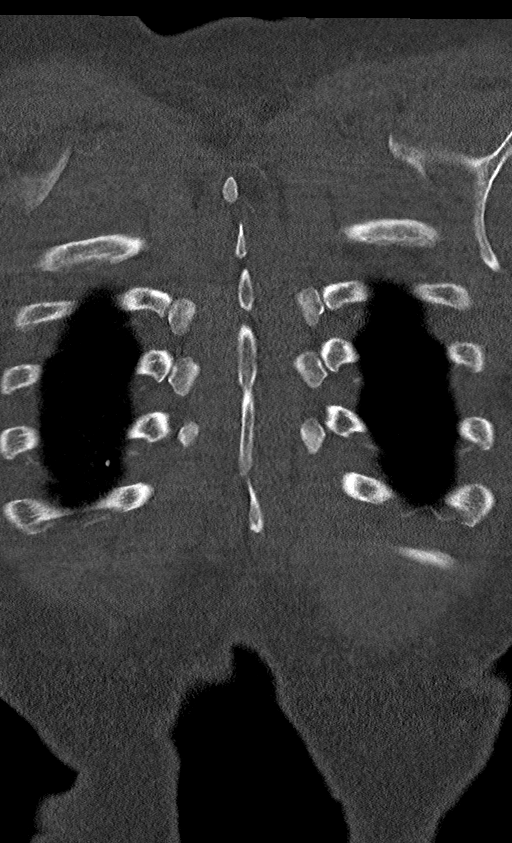
[im 35/88  bone]
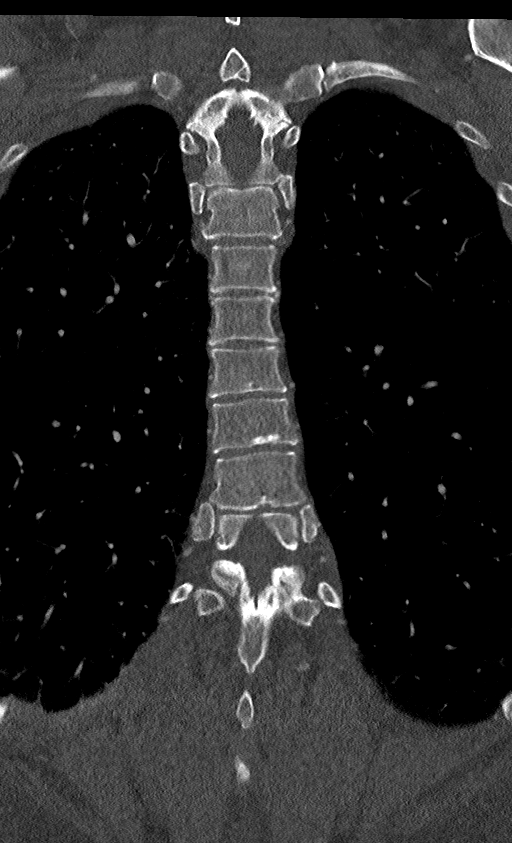
[im 53/88  bone]
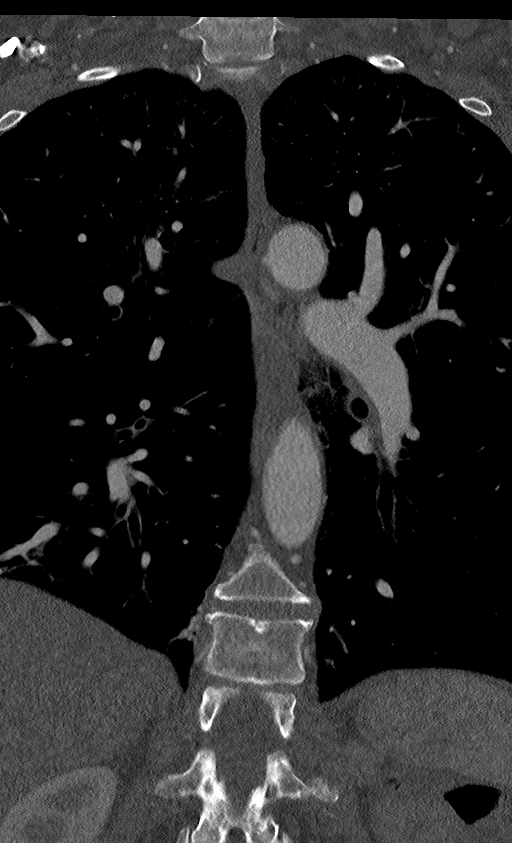

[Series 8: sag t-spine bone · sagittal · 0.36mm/px · 5 of 94 slices shown, 6 images]
[im 32/94  bone]
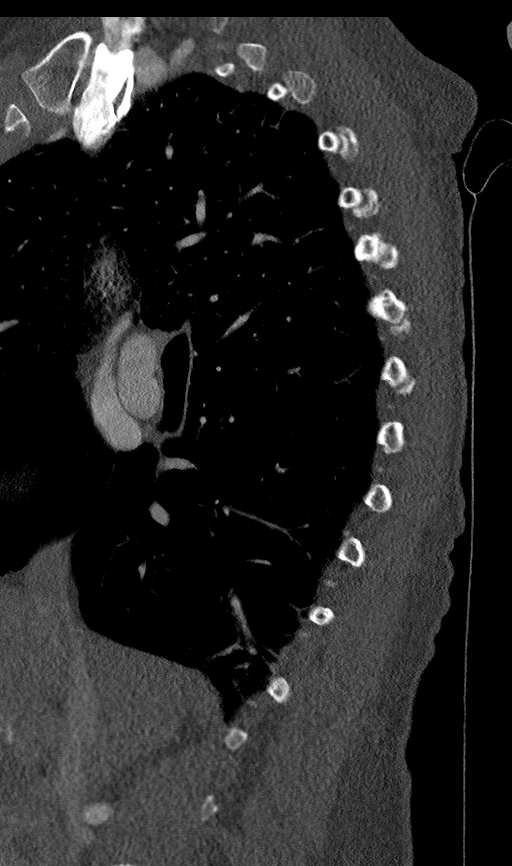
[im 39/94  bone]
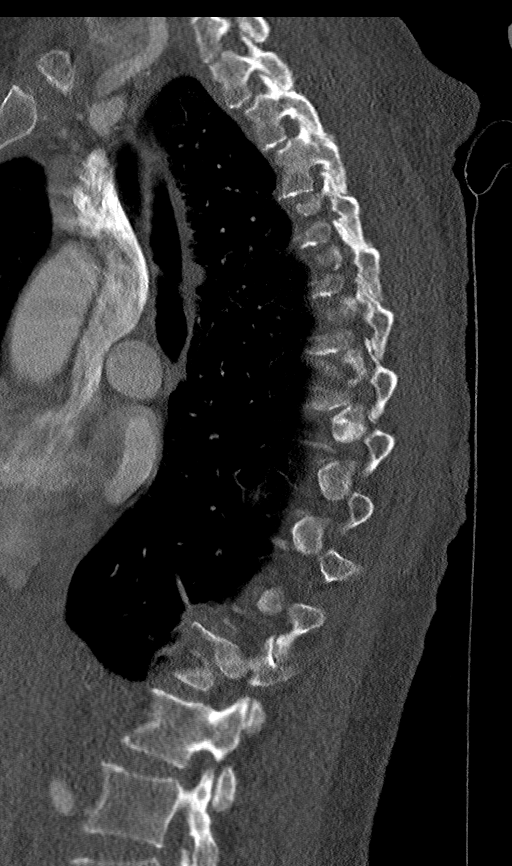
[im 47/94  soft-tissue]
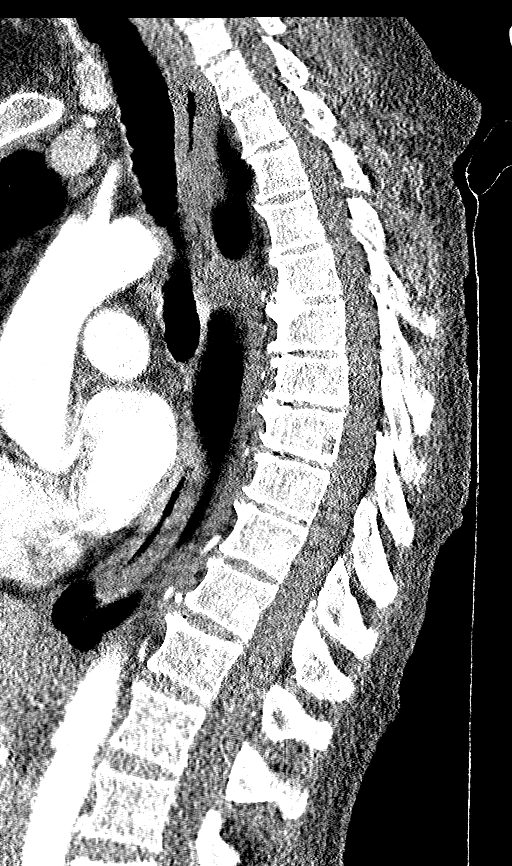
[im 47/94  bone]
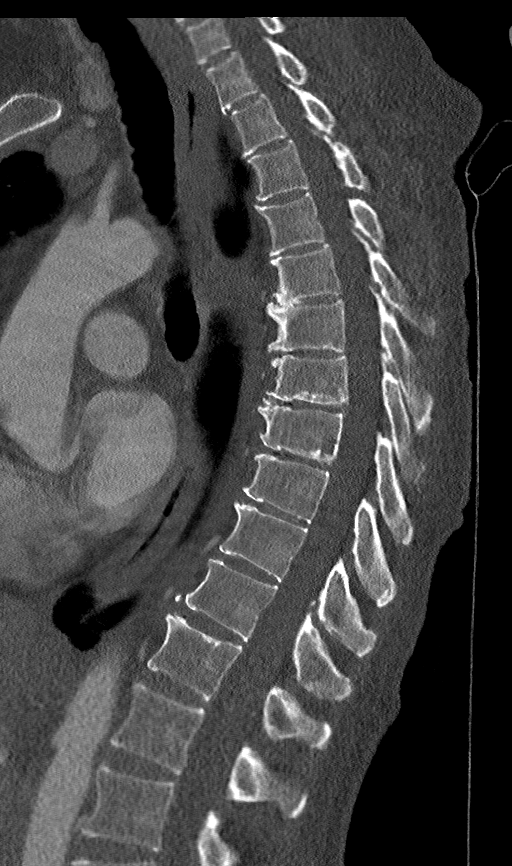
[im 55/94  bone]
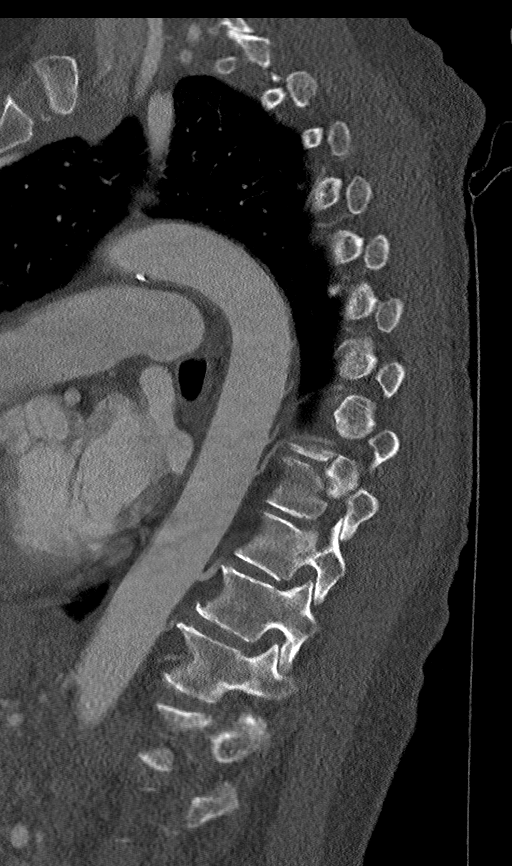
[im 63/94  bone]
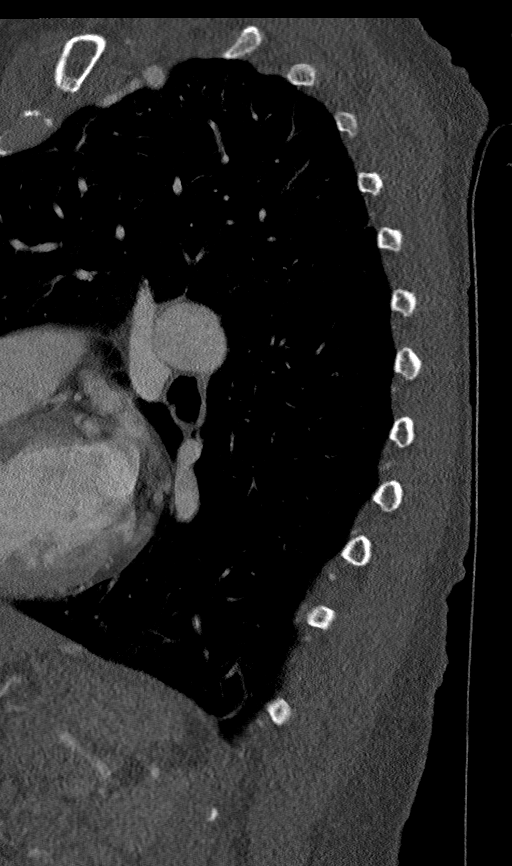

[11 of 33 positions shown; findings below may reference images not displayed]

FINDINGS: CT THORACIC SPINE FINDINGS

Alignment: No significant listhesis.

Vertebrae: Mild degenerative endplate irregularity. Vertebral body
heights are otherwise maintained. No acute fracture. No destructive
osseous lesion.

Paraspinal and other soft tissues: Extra-spinal findings are better
evaluated on concurrent source imaging.

Disc levels: Mild, facet predominant degenerative changes are
present. There is no significant canal narrowing identified.
Scattered mild foraminal narrowing due to facet spurring.

CT LUMBAR SPINE FINDINGS

Segmentation: 5 lumbar type vertebrae.

Alignment: No significant listhesis.

Vertebrae: Vertebral body heights are maintained. No acute fracture.
No destructive osseous lesion.

Paraspinal and other soft tissues: Extra-spinal findings are better
evaluated on concurrent source imaging.

Disc levels: Mild lower lumbar degenerative disc disease. Mild facet
hypertrophy. No significant canal stenosis. Foraminal narrowing is
present at L4-L5 and L5-S1.
IMPRESSION: No acute osseous abnormality.  Mild degenerative changes.

## 2022-05-01 IMAGING — CT CT L SPINE W/O CM
3 of 4 series · 13 of 33 positions shown, 16 images · IV contrast (APPLIED)
Comparison: None Available.

CLINICAL DATA: Pain

EXAM:
CT Thoracic and Lumbar spine with contrast
TECHNIQUE: Multiplanar CT images of the thoracic and lumbar spine were
reconstructed from contemporary CT of the Chest, Abdomen, and
Pelvis.
RADIATION DOSE REDUCTION: This exam was performed according to the
departmental dose-optimization program which includes automated
exposure control, adjustment of the mA and/or kV according to
patient size and/or use of iterative reconstruction technique.
CONTRAST:  No additional

[Series 1: cor l-spine bone · coronal · 0.32mm/px · 3 of 88 slices shown]
[im 18/88  bone]
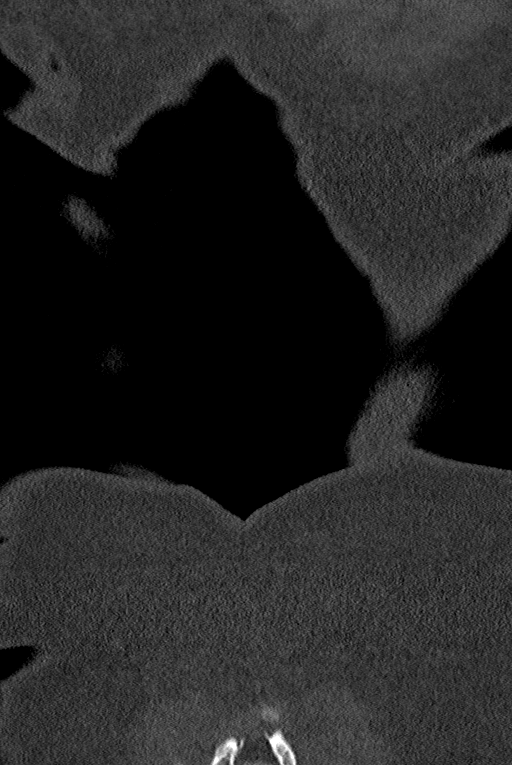
[im 35/88  bone]
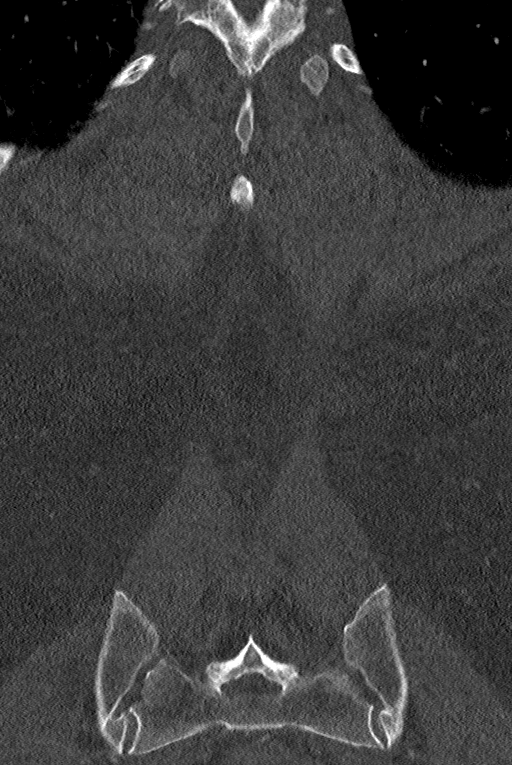
[im 53/88  bone]
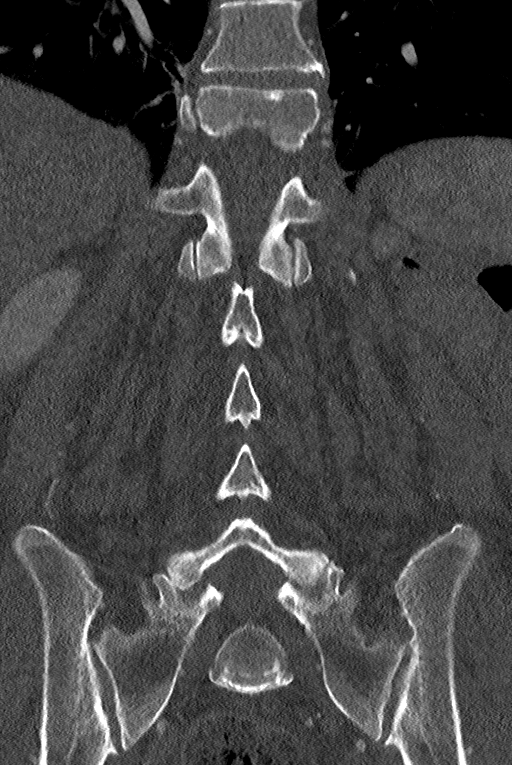

[Series 2: sag l-spine bone · sagittal · 0.38mm/px · 5 of 82 slices shown, 6 images]
[im 28/82  bone]
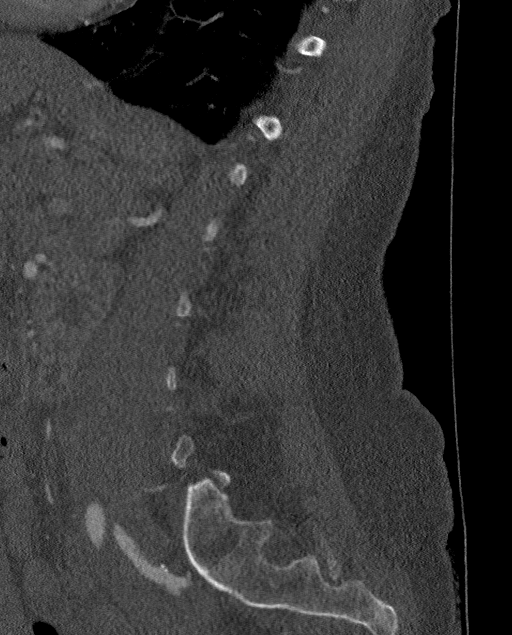
[im 34/82  bone]
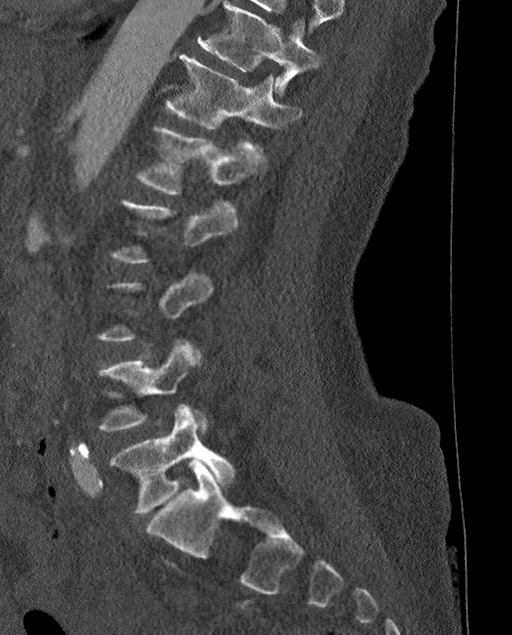
[im 41/82  soft-tissue]
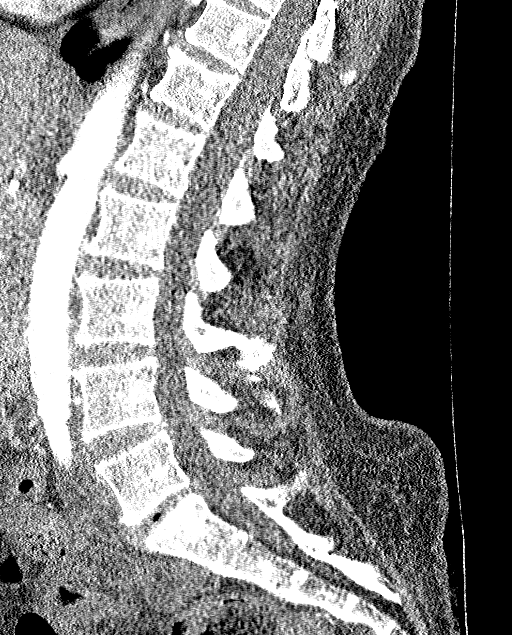
[im 41/82  bone]
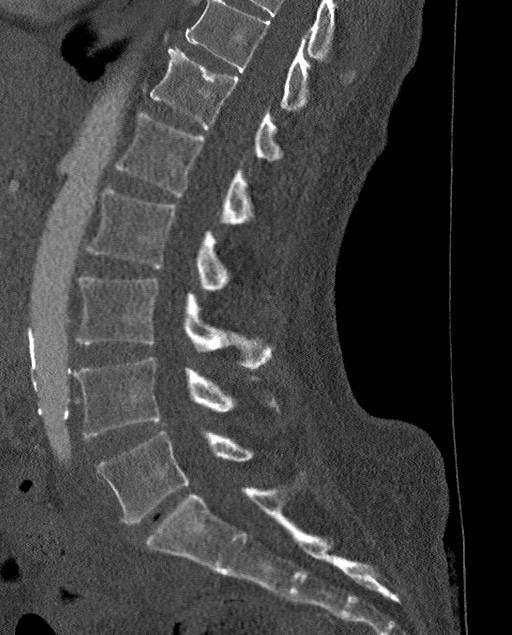
[im 48/82  bone]
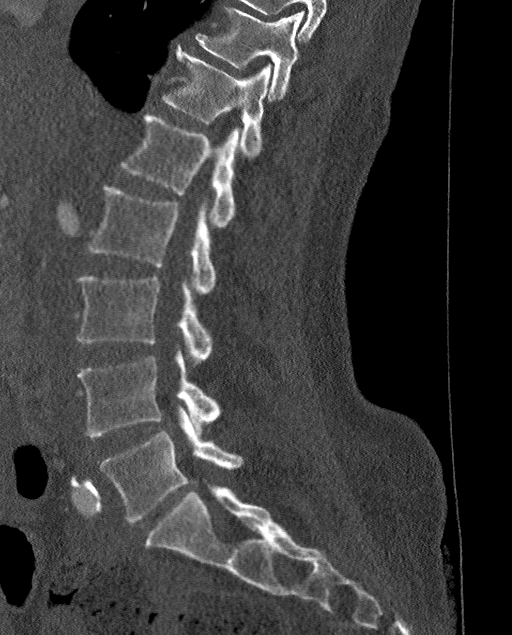
[im 55/82  bone]
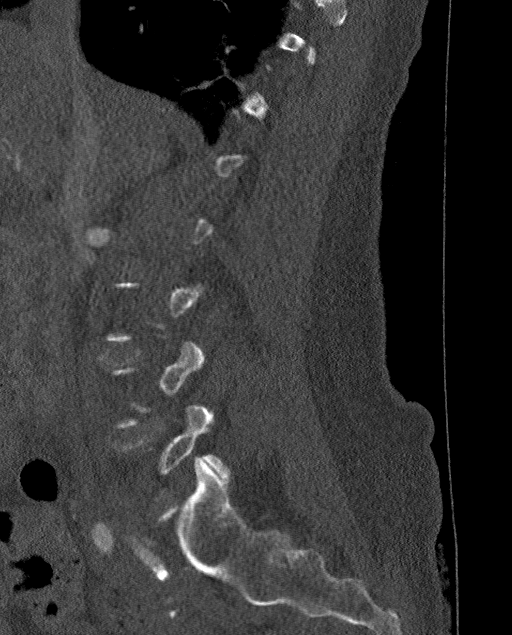

[Series 503: axial spine st · axial · 0.37mm/px · z∈[-582,-270]mm · 5 of 235 slices shown, 7 images]
[im 40/235  soft-tissue]
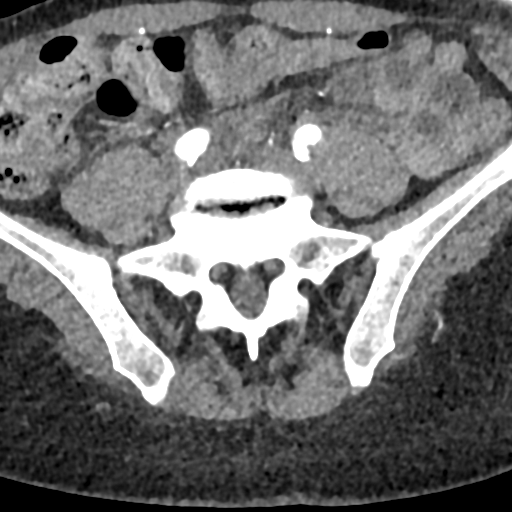
[im 40/235  bone]
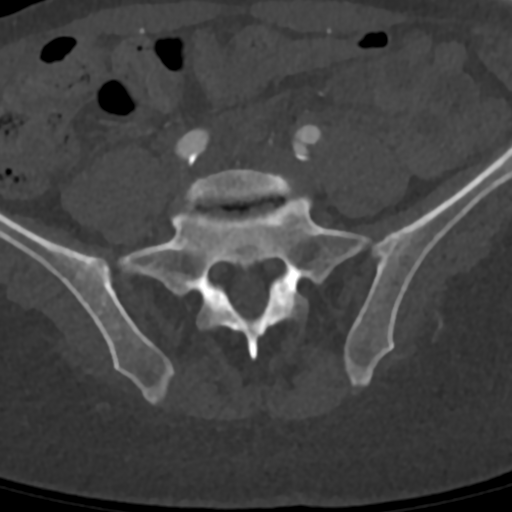
[im 79/235  bone]
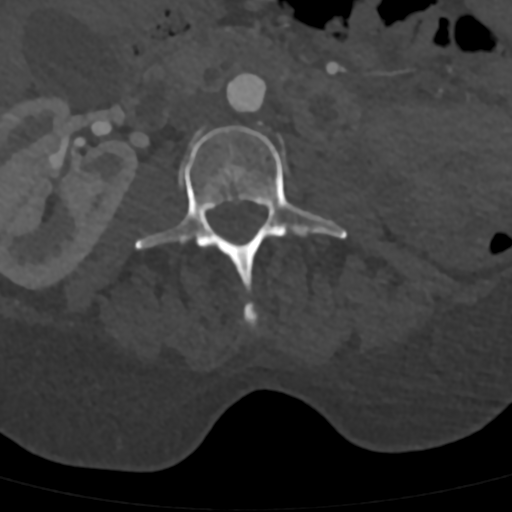
[im 118/235  bone]
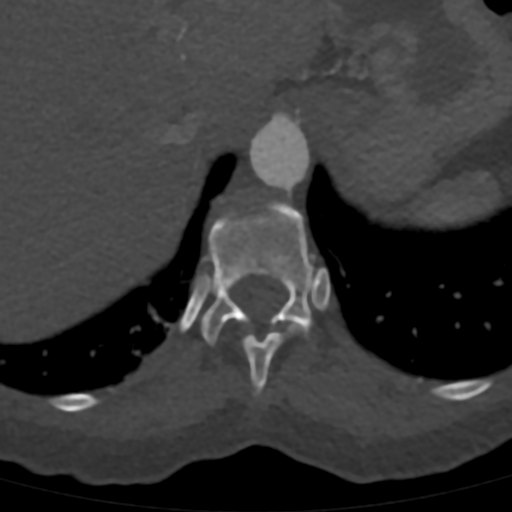
[im 157/235  bone]
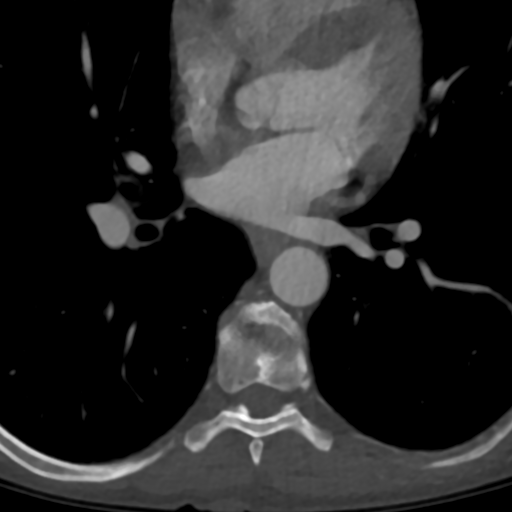
[im 196/235  soft-tissue]
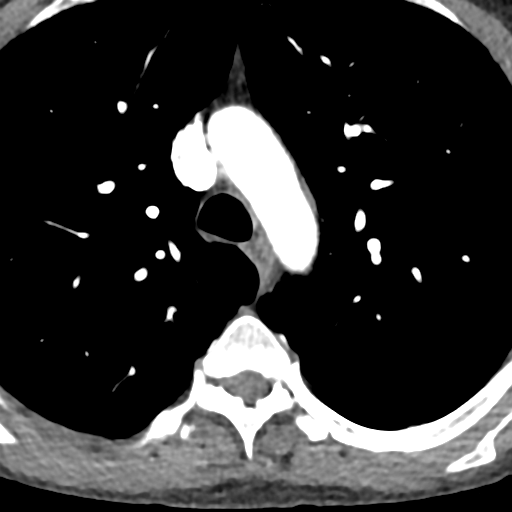
[im 196/235  bone]
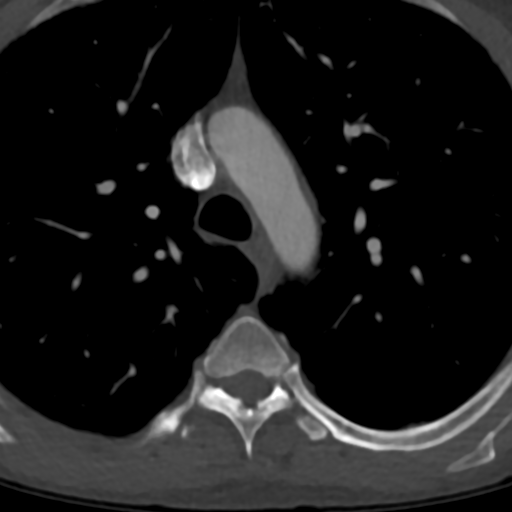

[13 of 33 positions shown; findings below may reference images not displayed]

FINDINGS: CT THORACIC SPINE FINDINGS

Alignment: No significant listhesis.

Vertebrae: Mild degenerative endplate irregularity. Vertebral body
heights are otherwise maintained. No acute fracture. No destructive
osseous lesion.

Paraspinal and other soft tissues: Extra-spinal findings are better
evaluated on concurrent source imaging.

Disc levels: Mild, facet predominant degenerative changes are
present. There is no significant canal narrowing identified.
Scattered mild foraminal narrowing due to facet spurring.

CT LUMBAR SPINE FINDINGS

Segmentation: 5 lumbar type vertebrae.

Alignment: No significant listhesis.

Vertebrae: Vertebral body heights are maintained. No acute fracture.
No destructive osseous lesion.

Paraspinal and other soft tissues: Extra-spinal findings are better
evaluated on concurrent source imaging.

Disc levels: Mild lower lumbar degenerative disc disease. Mild facet
hypertrophy. No significant canal stenosis. Foraminal narrowing is
present at L4-L5 and L5-S1.
IMPRESSION: No acute osseous abnormality.  Mild degenerative changes.

## 2022-05-01 IMAGING — CT CT ANGIO CHEST-ABD-PELV FOR DISSECTION W/ AND WO/W CM
2 of 7 series · 14 of 46 positions shown, 16 images · IV contrast (APPLIED)
Comparison: [DATE].

CLINICAL DATA: Chest and abdominal pain.

EXAM:
CT ANGIOGRAPHY CHEST, ABDOMEN AND PELVIS
TECHNIQUE: Non-contrast CT of the chest was initially obtained.

[Series 4: arterial · axial · arterial · 0.69mm/px · z∈[-758,-226]mm · 11 of 300 slices shown, 13 images]
[im 17/300  soft-tissue]
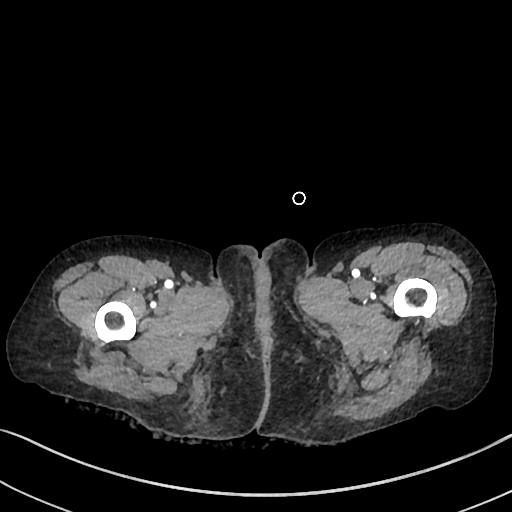
[im 17/300  bone]
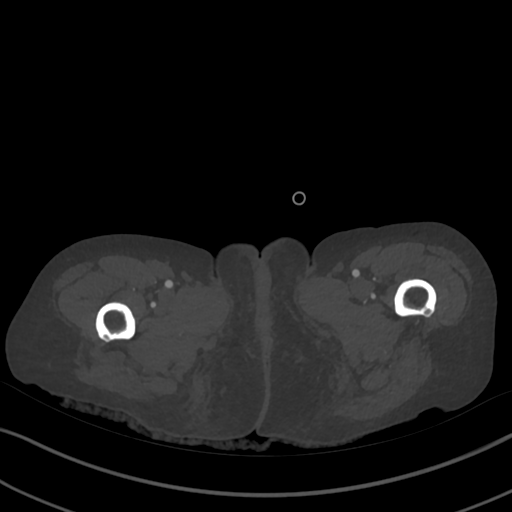
[im 50/300  soft-tissue]
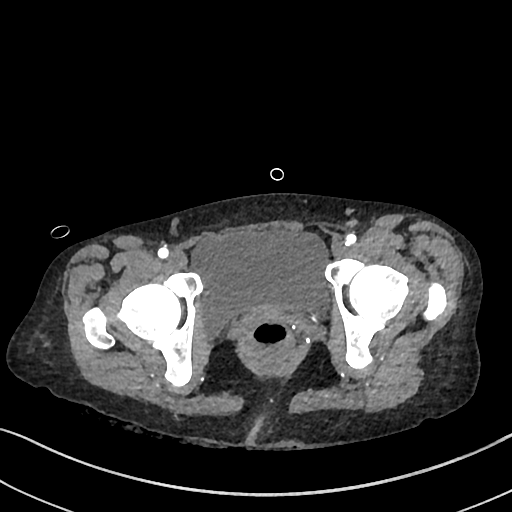
[im 67/300  soft-tissue]
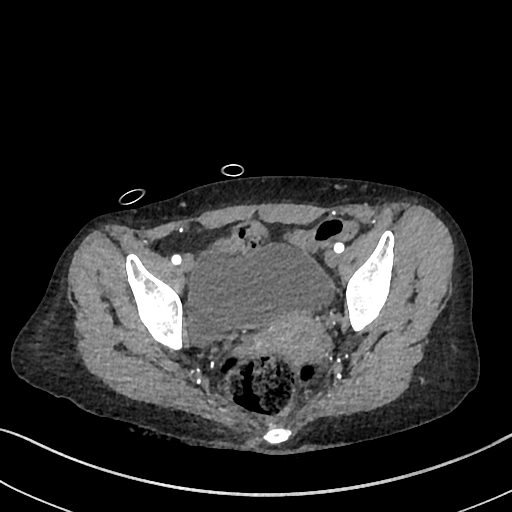
[im 100/300  soft-tissue]
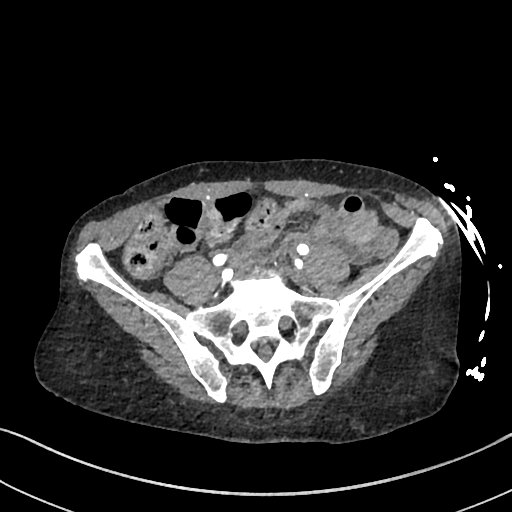
[im 117/300  soft-tissue]
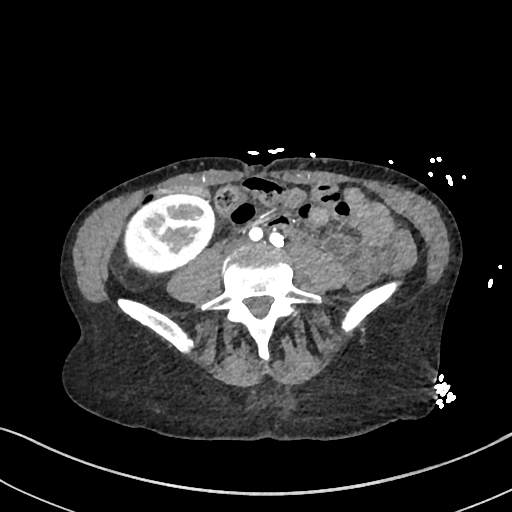
[im 150/300  soft-tissue]
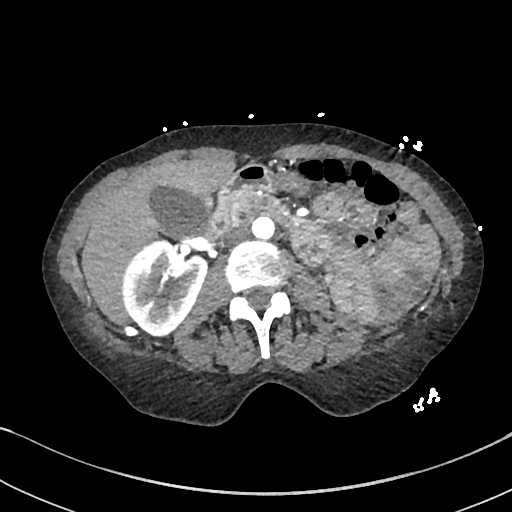
[im 183/300  soft-tissue]
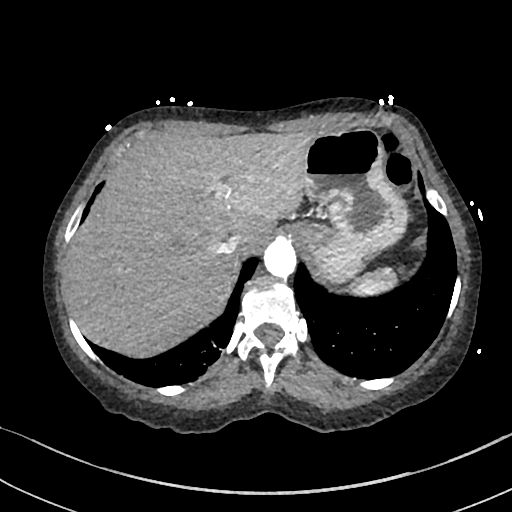
[im 200/300  soft-tissue]
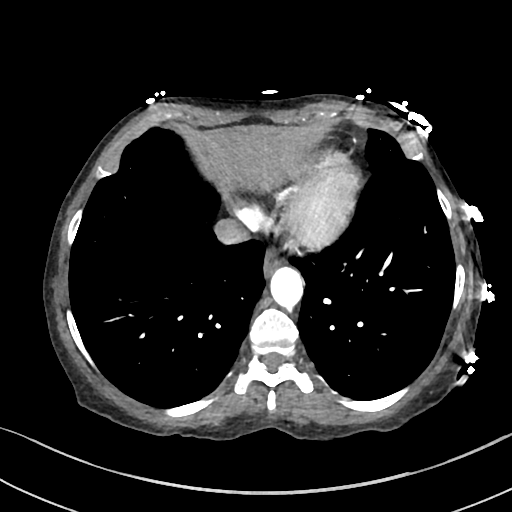
[im 233/300  soft-tissue]
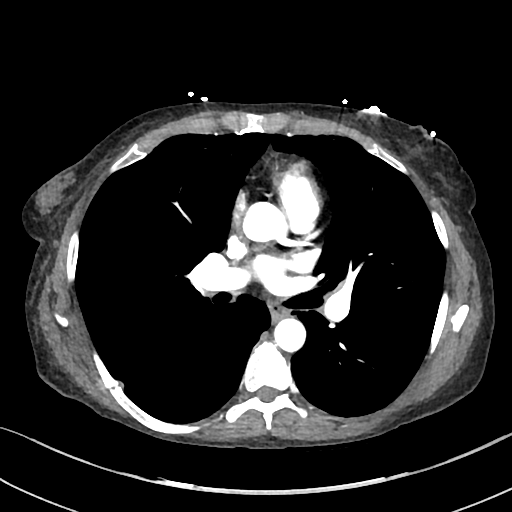
[im 233/300  bone]
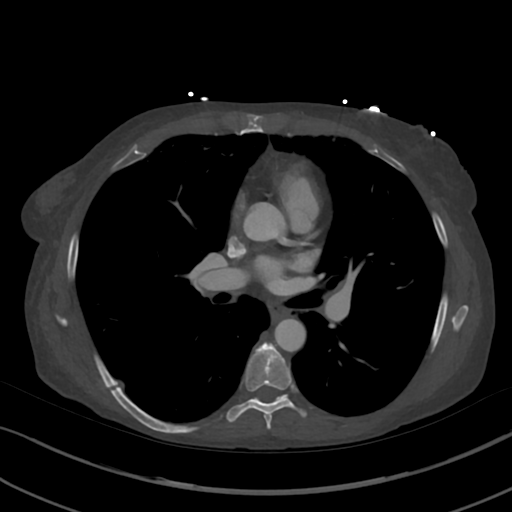
[im 250/300  soft-tissue]
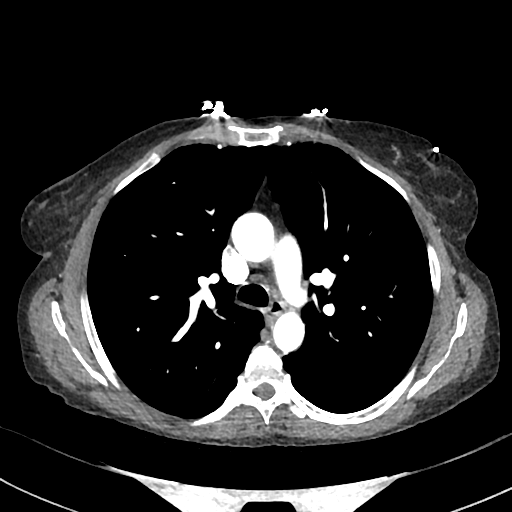
[im 283/300  soft-tissue]
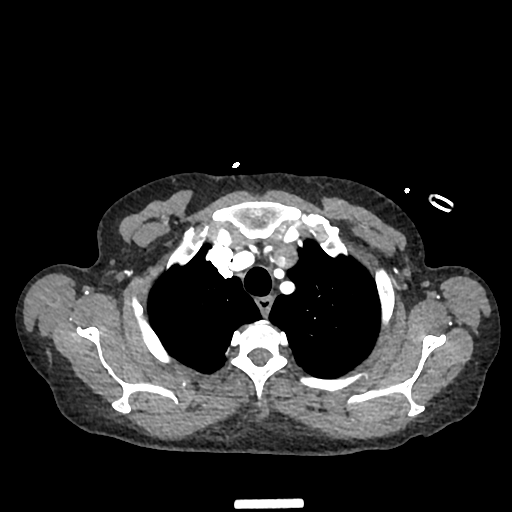

[Series 7: cor · coronal · 0.83mm/px · 3 of 139 slices shown]
[im 35/139  soft-tissue]
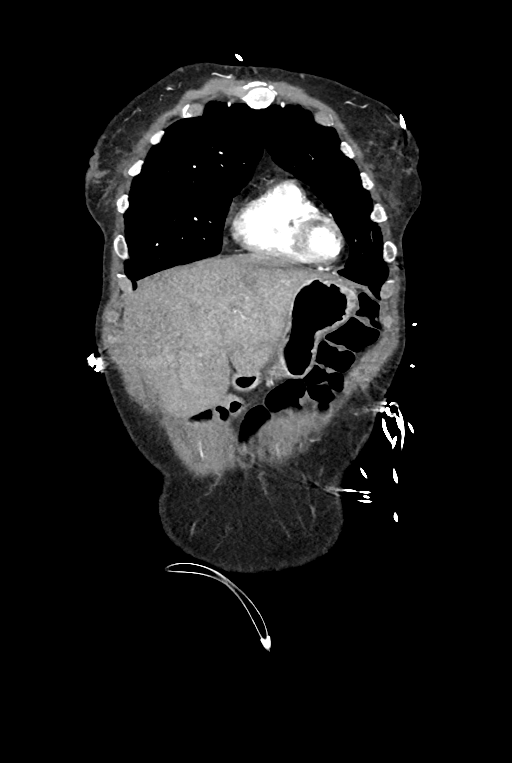
[im 70/139  soft-tissue]
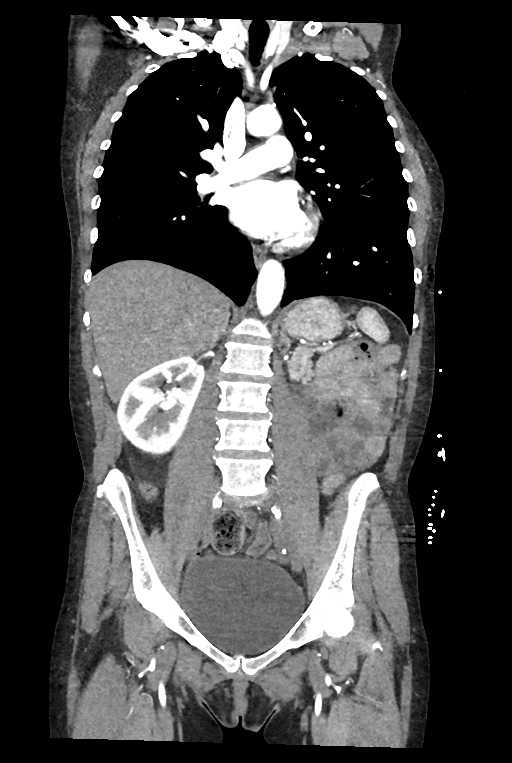
[im 104/139  soft-tissue]
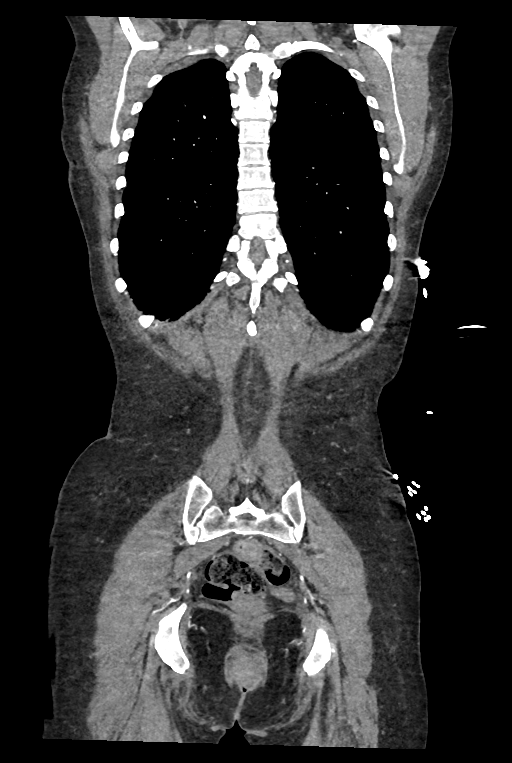

[14 of 46 positions shown; findings below may reference images not displayed]

Multidetector CT imaging through the chest, abdomen and pelvis was
performed using the standard protocol during bolus administration of
intravenous contrast. Multiplanar reconstructed images and MIPs were
obtained and reviewed to evaluate the vascular anatomy.

RADIATION DOSE REDUCTION: This exam was performed according to the
departmental dose-optimization program which includes automated
exposure control, adjustment of the mA and/or kV according to
patient size and/or use of iterative reconstruction technique.

CONTRAST:  75mL OMNIPAQUE IOHEXOL 350 MG/ML SOLN
FINDINGS: CTA CHEST FINDINGS

Cardiovascular: Preferential opacification of the thoracic aorta. No
evidence of thoracic aortic aneurysm or dissection. Normal heart
size. No pericardial effusion.

Mediastinum/Nodes: No enlarged mediastinal, hilar, or axillary lymph
nodes. Thyroid gland, trachea, and esophagus demonstrate no
significant findings.

Lungs/Pleura: No pneumothorax or pleural effusion is noted.
Emphysematous disease is noted throughout both lungs. No acute
pulmonary disease is noted.

Musculoskeletal: No chest wall abnormality. No acute or significant
osseous findings.

Review of the MIP images confirms the above findings.

CTA ABDOMEN AND PELVIS FINDINGS

VASCULAR

Aorta: Atherosclerosis of abdominal aorta is noted without aneurysm
or dissection.

Celiac: Patent without evidence of aneurysm, dissection, vasculitis
or significant stenosis.

SMA: Patent without evidence of aneurysm, dissection, vasculitis or
significant stenosis.

Renals: There is absence of the left kidney which may be congenital
or postsurgical. Right renal artery is patent without evidence of
aneurysm, dissection, vasculitis, fibromuscular dysplasia or
significant stenosis.

IMA: Patent without evidence of aneurysm, dissection, vasculitis or
significant stenosis.

Inflow: Patent without evidence of aneurysm, dissection, vasculitis
or significant stenosis.

Veins: No obvious venous abnormality within the limitations of this
arterial phase study.

Review of the MIP images confirms the above findings.

NON-VASCULAR

Hepatobiliary: No focal liver abnormality is seen. No gallstones,
gallbladder wall thickening, or biliary dilatation.

Pancreas: Unremarkable. No pancreatic ductal dilatation or
surrounding inflammatory changes.

Spleen: Normal in size without focal abnormality.

Adrenals/Urinary Tract: Adrenal glands are unremarkable. As noted
above, there is either congenital or surgical absence of the left
kidney. Right kidney appears normal. No hydronephrosis or renal
obstruction is noted. Urinary bladder is unremarkable.

Stomach/Bowel: The stomach appears normal. There is no evidence of
bowel obstruction or inflammation. The appendix is not clearly
visualized.

Lymphatic: No significant adenopathy is noted.

Reproductive: Uterus and bilateral adnexa are unremarkable.

Other: No abdominal wall hernia or abnormality. No abdominopelvic
ascites.

Musculoskeletal: No acute or significant osseous findings.

Review of the MIP images confirms the above findings.
IMPRESSION: There is no evidence of thoracic or abdominal aortic dissection or
aneurysm.

Left kidney is not visualized either due to congenital absence or
post nephrectomy status.

No acute abnormality is noted in the chest, abdomen or pelvis.

Aortic Atherosclerosis ([QO]-[QO]) and Emphysema ([QO]-[QO]).

## 2022-05-01 IMAGING — DX DG CHEST 1V PORT
1 series · 1 of 1 positions shown · non-contrast
Comparison: [DATE]

CLINICAL DATA: Shortness of breath

EXAM:
PORTABLE CHEST 1 VIEW

[chest ap]
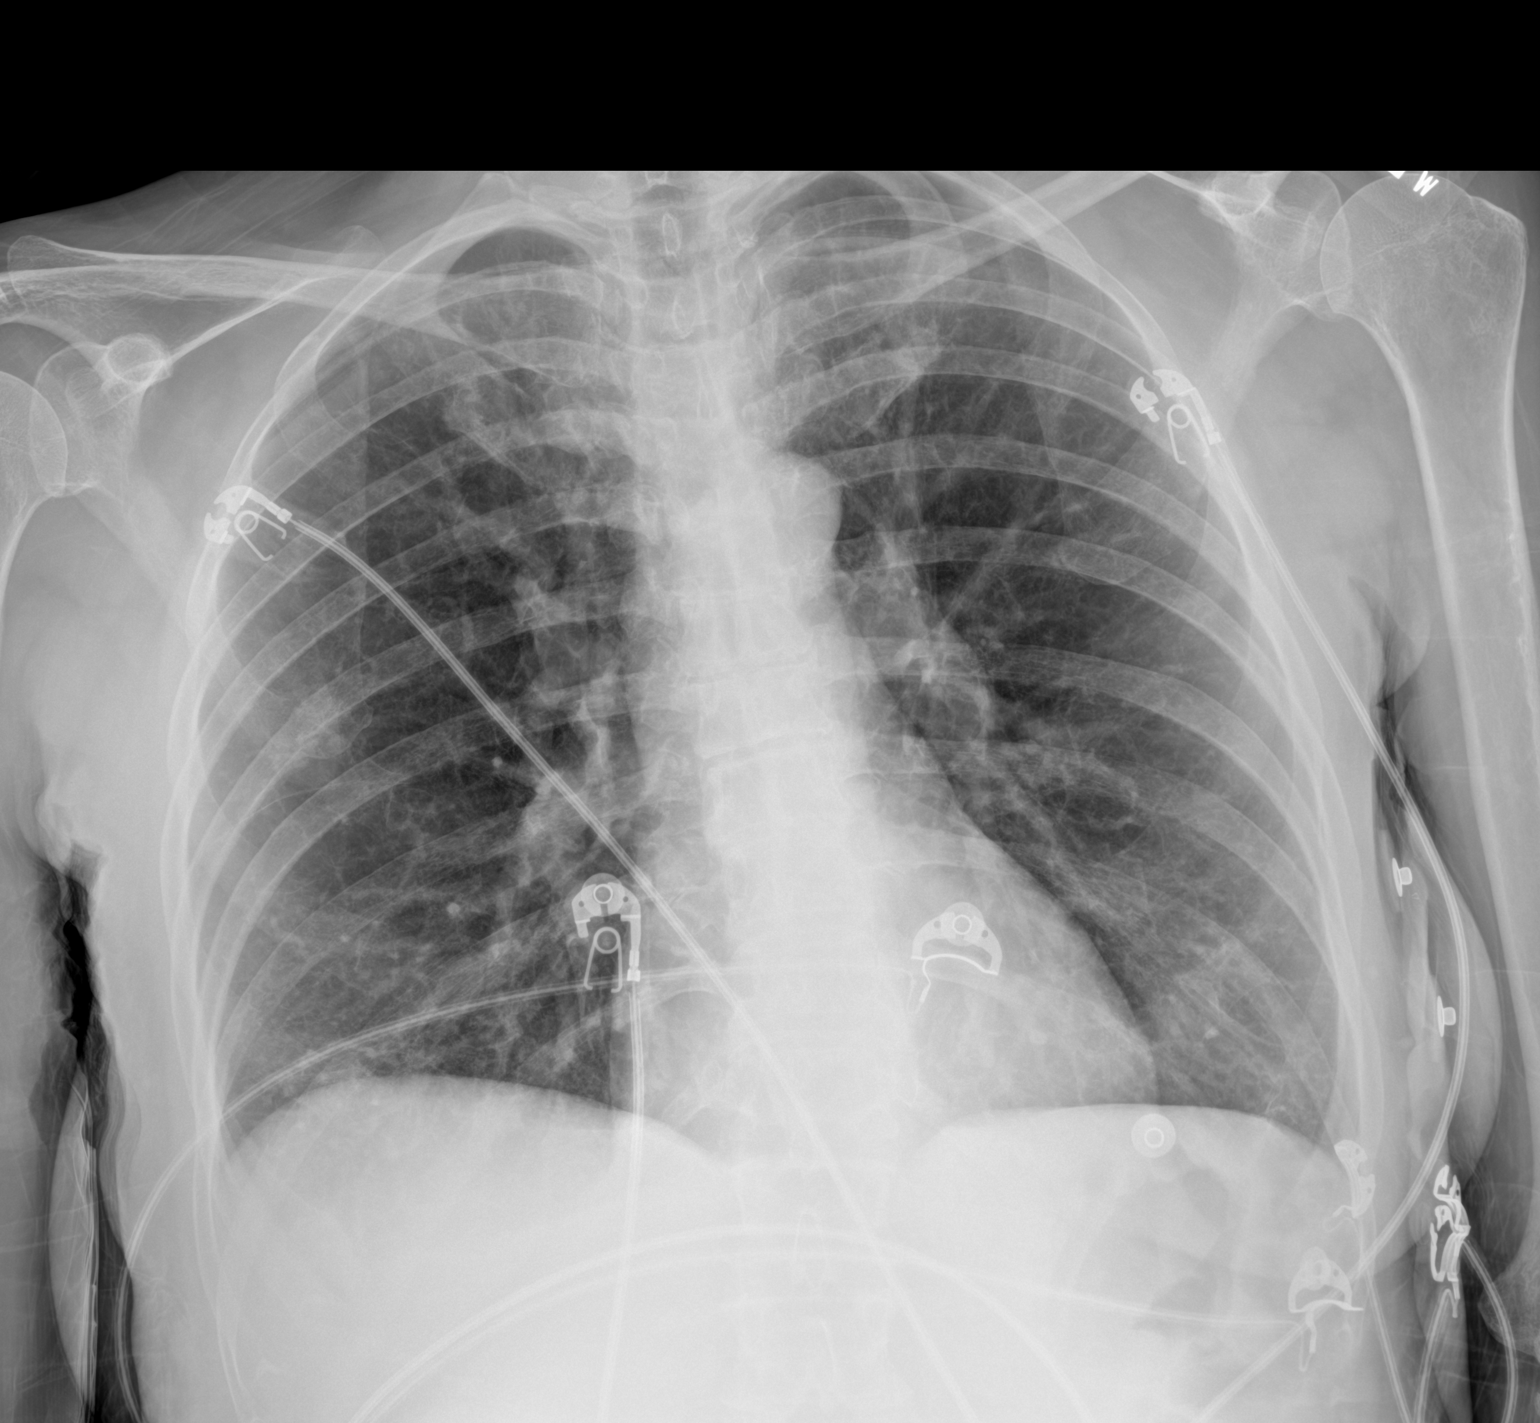

[1 of 1 positions shown; findings below may reference images not displayed]

FINDINGS: Cardiac size is within normal limits. There are no signs of
pulmonary edema or focal pulmonary consolidation. There is no
pleural effusion or pneumothorax.
IMPRESSION: No active disease.

## 2022-05-01 IMAGING — MR MR LUMBAR SPINE W/O CM
5 series · 34 of 48 positions shown · non-contrast
Comparison: CT lumbar spine [DATE]. MRI lumbar spine [DATE]

CLINICAL DATA: Low back pain.  History breast cancer

EXAM:
MRI LUMBAR SPINE WITHOUT CONTRAST
TECHNIQUE: Multiplanar, multisequence MR imaging of the lumbar spine was
performed. No intravenous contrast was administered.

[Series 1: T2 · sagittal · 4.0mm · 1.02mm/px · 6 of 15 slices shown (1 of 2)]
[im 1/15]
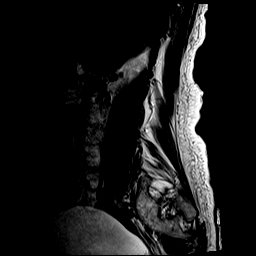
[im 3/15]
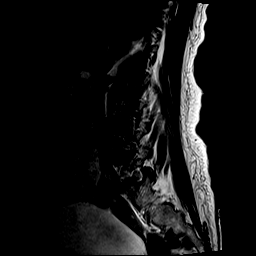
[im 6/15]
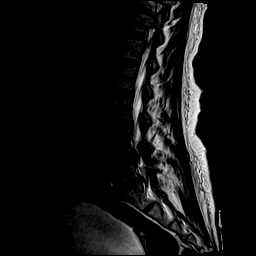
[im 9/15]
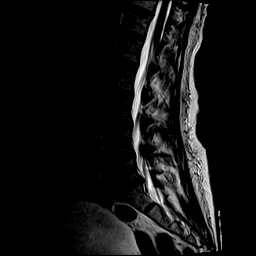
[im 12/15]
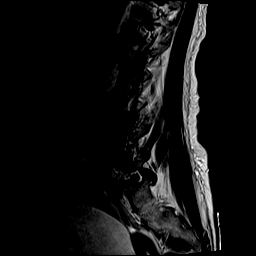
[im 15/15]
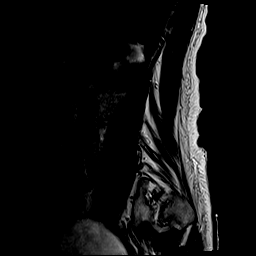

[Series 2: T1 · sagittal · 4.0mm · 1.02mm/px · 6 of 15 slices shown (1 of 2)]
[im 1/15]
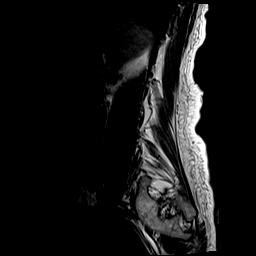
[im 3/15]
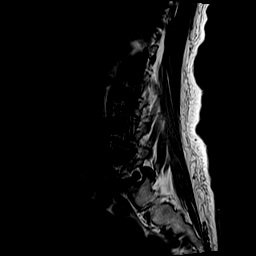
[im 6/15]
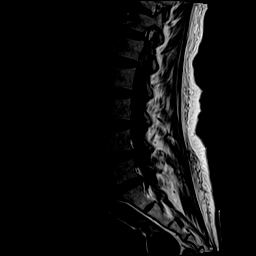
[im 9/15]
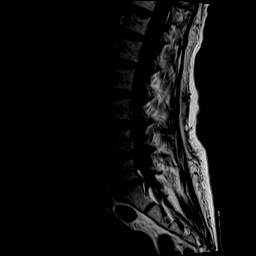
[im 12/15]
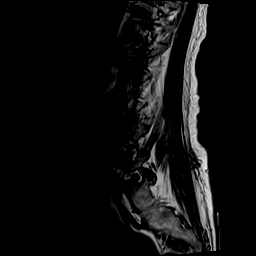
[im 15/15]
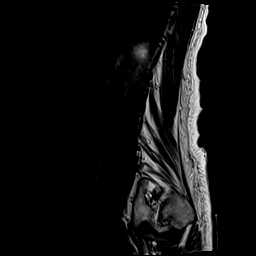

[Series 3: STIR · sagittal · 4.0mm · 0.51mm/px · 4 of 15 slices shown]
[im 1/15]
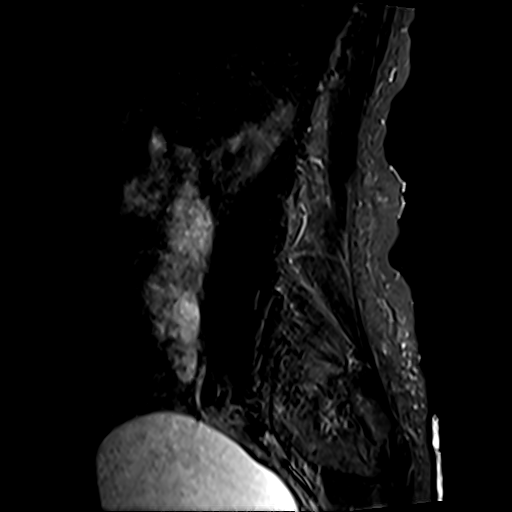
[im 3/15]
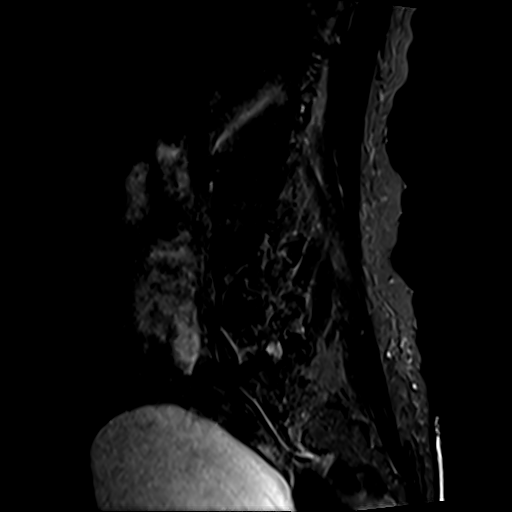
[im 6/15]
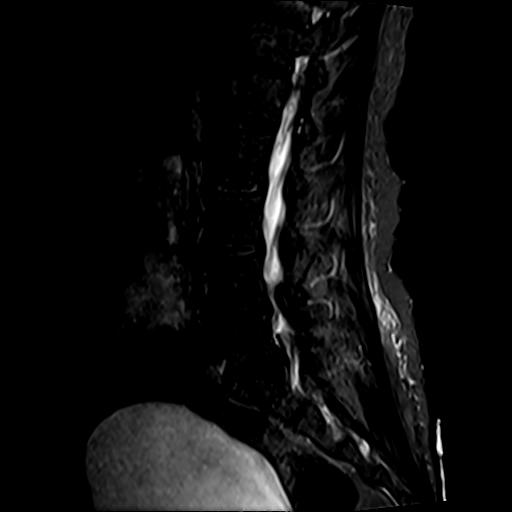
[im 9/15]
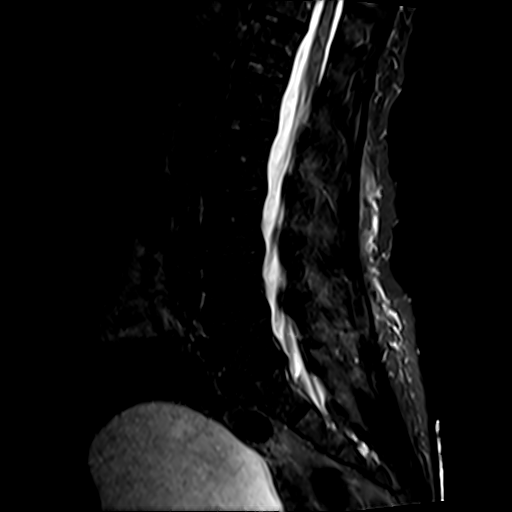

[Series 4: T2 · axial · 4.0mm · 0.78mm/px · z∈[-550,-350]mm · 9 of 36 slices shown (2 of 2)]
[im 1/36]
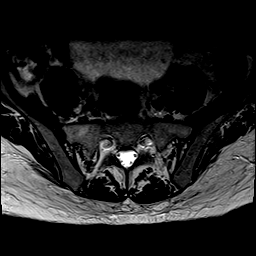
[im 6/36]
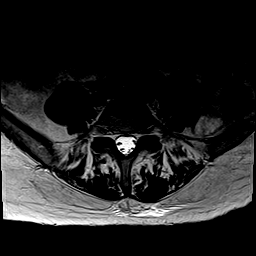
[im 11/36]
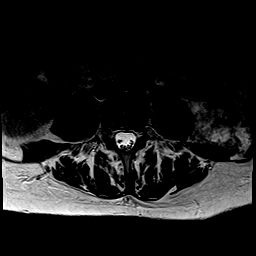
[im 16/36]
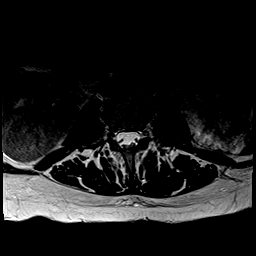
[im 18/36]
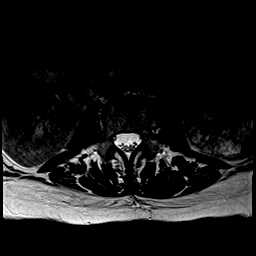
[im 21/36]
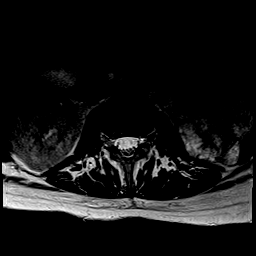
[im 26/36]
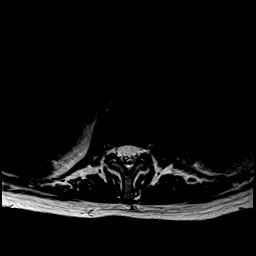
[im 31/36]
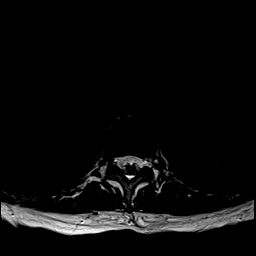
[im 36/36]
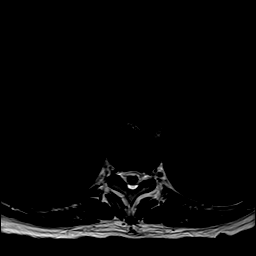

[Series 5: T1 · axial · 4.0mm · 0.39mm/px · z∈[-550,-350]mm · 9 of 36 slices shown (2 of 2)]
[im 1/36]
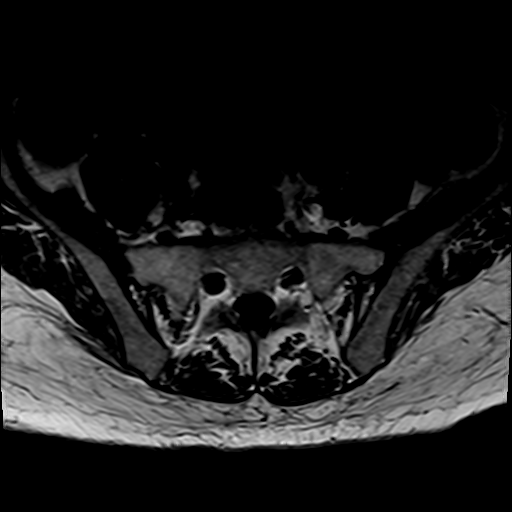
[im 6/36]
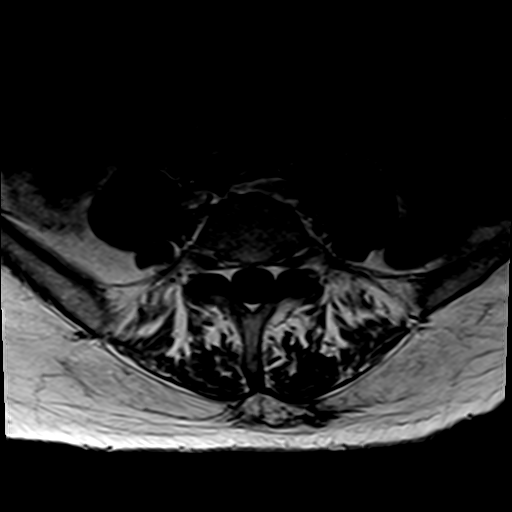
[im 11/36]
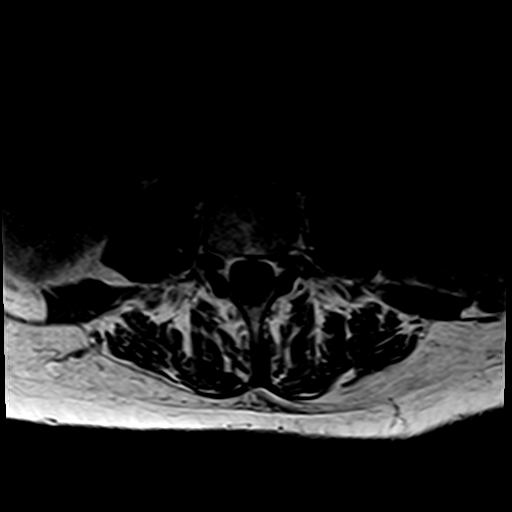
[im 16/36]
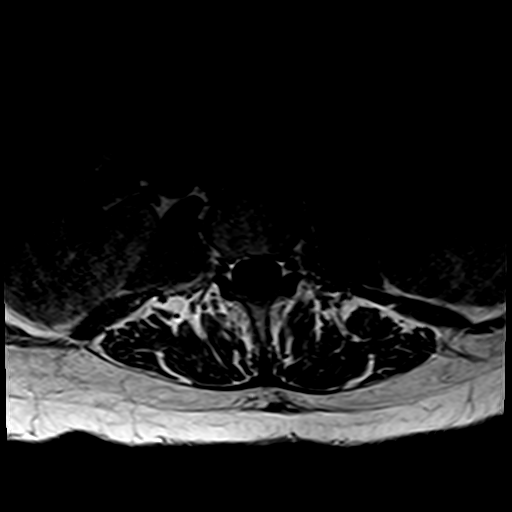
[im 18/36]
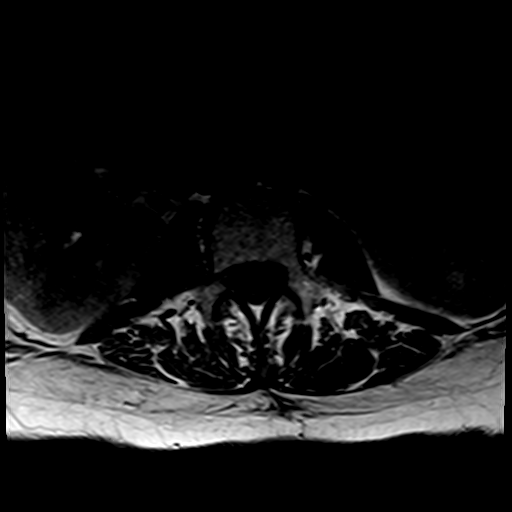
[im 21/36]
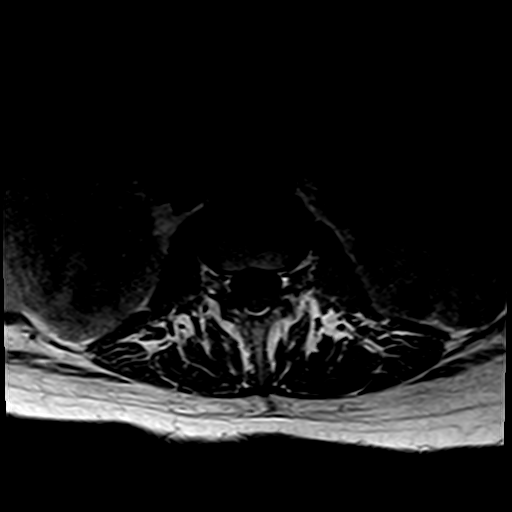
[im 26/36]
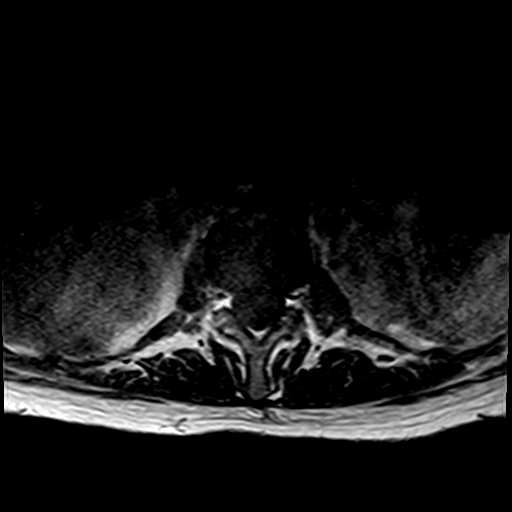
[im 31/36]
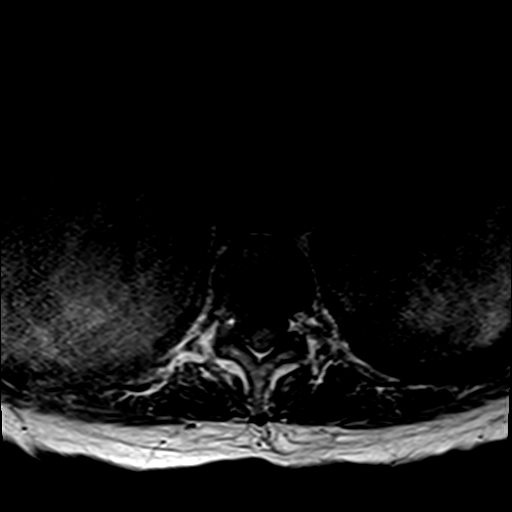
[im 36/36]
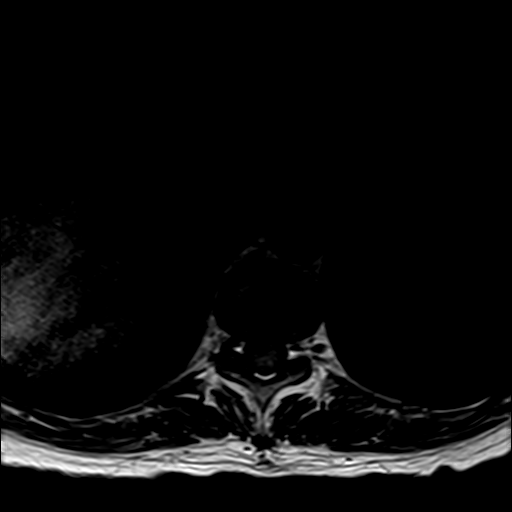

[34 of 48 positions shown; findings below may reference images not displayed]

FINDINGS: Segmentation:  5 lumbar vertebra

Alignment:  Normal

Vertebrae: Normal bone marrow. Negative for fracture or metastatic
disease.

Conus medullaris and cauda equina: Conus extends to the L1-2 level.
Conus and cauda equina appear normal.

Paraspinal and other soft tissues: Negative for paraspinous mass or
adenopathy.

Urinary bladder moderately distended.

Disc levels:

L1-2: Negative

L2-3: Negative

L3-4: Negative

L4-5: Mild disc degeneration with disc bulging. Shallow right
foraminal disc protrusion unchanged from the prior study. Mild
subarticular stenosis on the right unchanged. Mild facet
degeneration. Central canal patent

L5-S1: Small central disc protrusion unchanged. Negative for neural
impingement.
IMPRESSION: 1. Negative for metastatic disease
2. Mild lumbar degenerative change similar to the prior MRI
3. Distended urinary bladder.

## 2022-05-01 IMAGING — CT CT CERVICAL SPINE W/O CM
3 of 4 series · 10 of 33 positions shown, 12 images · non-contrast
Comparison: Cervical spine MRI [DATE].

CLINICAL DATA: Provided history: Neck pain, acute, no red flags.
Additional history provided: Patient reports numbness in arms and
legs. Difficulty breathing and pain from neck to feet. Hypertensive.



[Series 6: sag bone · sagittal · 0.27mm/px · 5 of 58 slices shown, 6 images]
[im 20/58  bone]
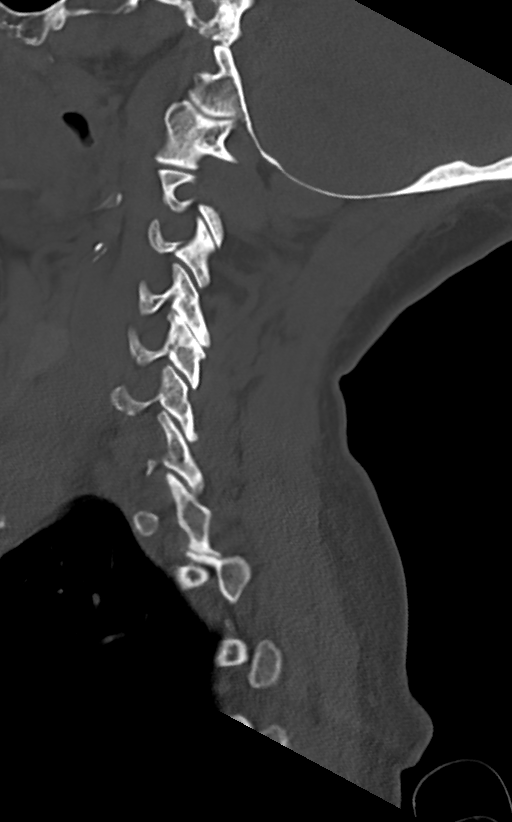
[im 24/58  bone]
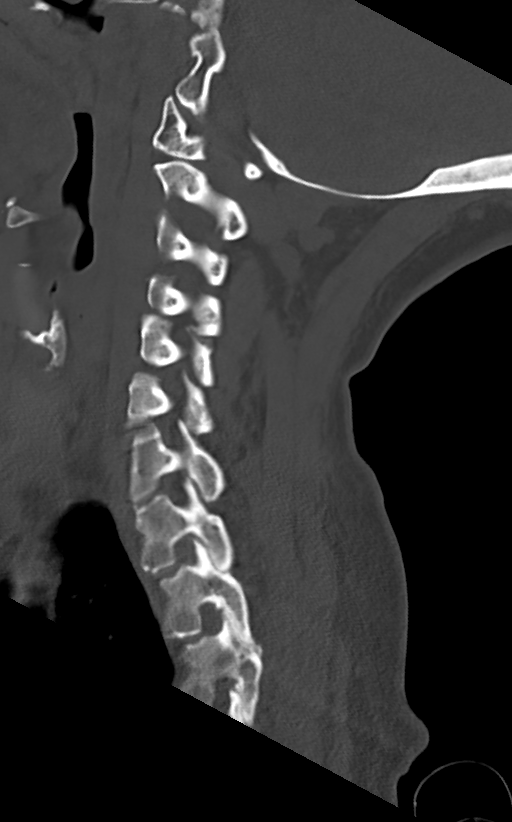
[im 29/58  soft-tissue]
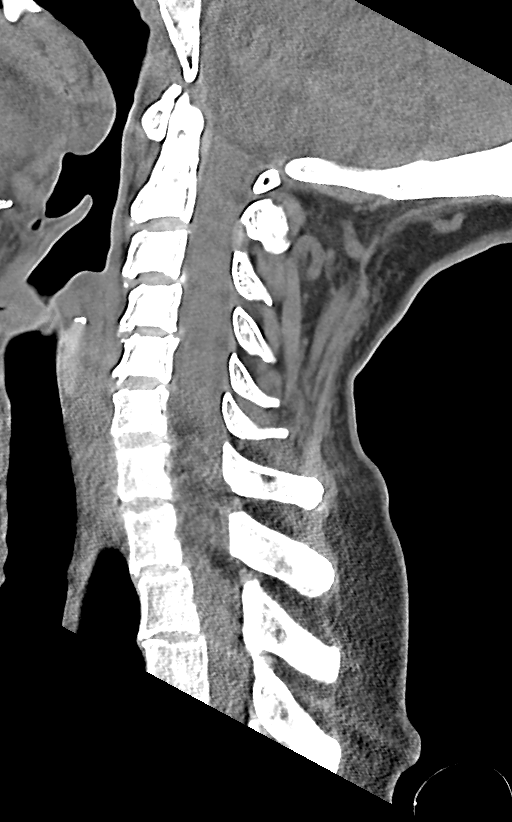
[im 29/58  bone]
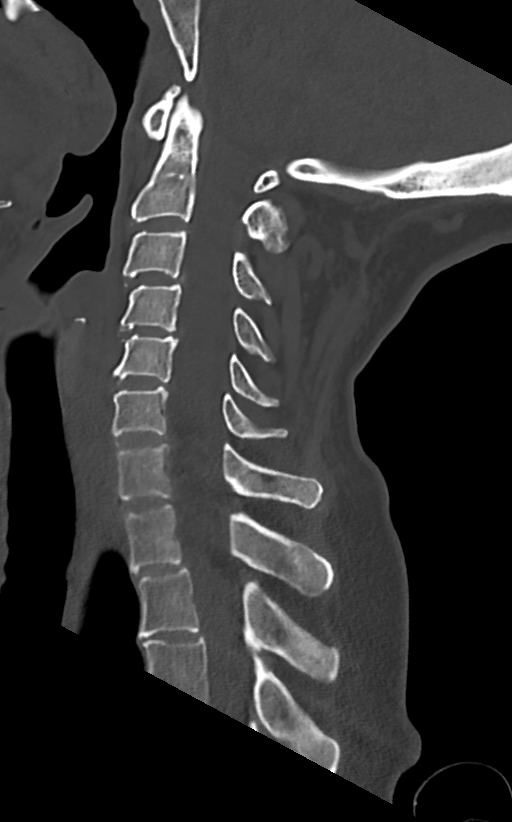
[im 34/58  bone]
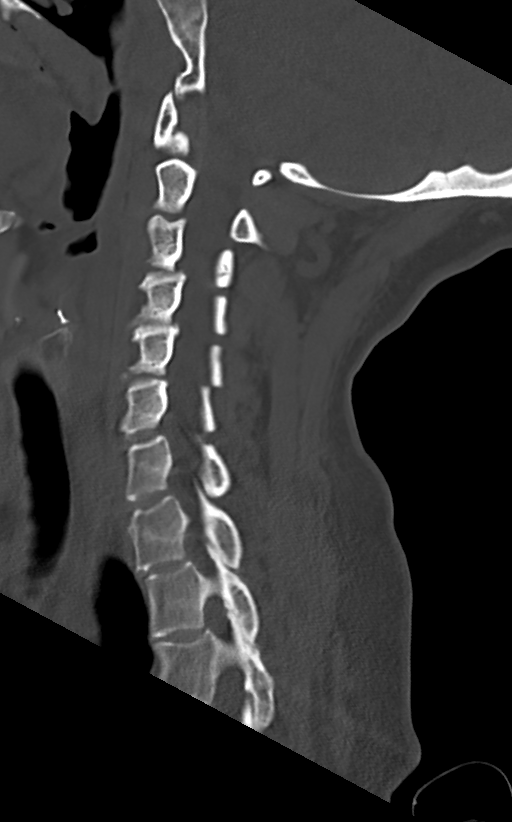
[im 39/58  bone]
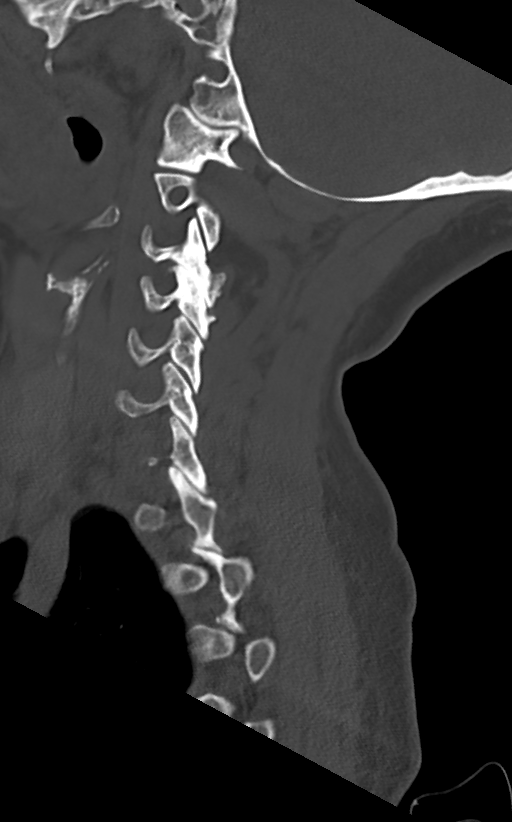

[Series 7: cor bone · coronal · 0.22mm/px · 3 of 53 slices shown]
[im 11/53  bone]
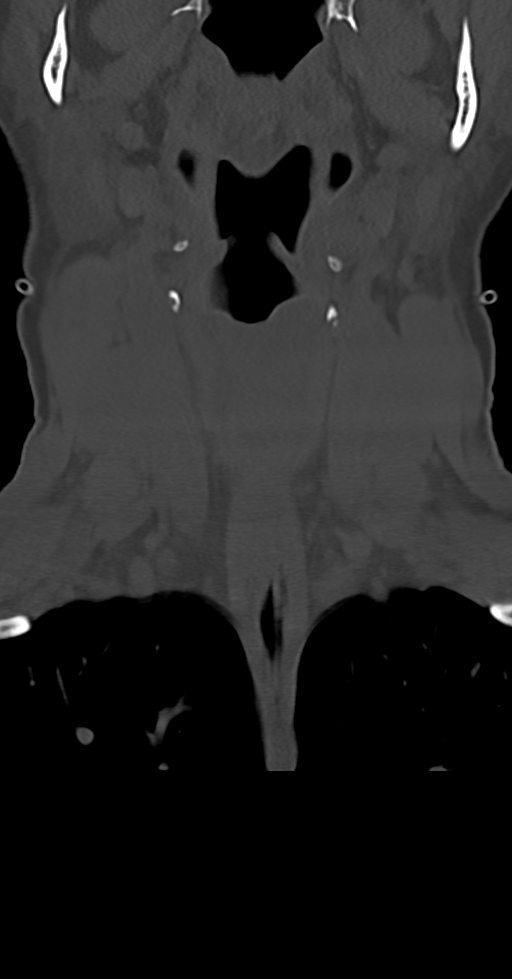
[im 21/53  bone]
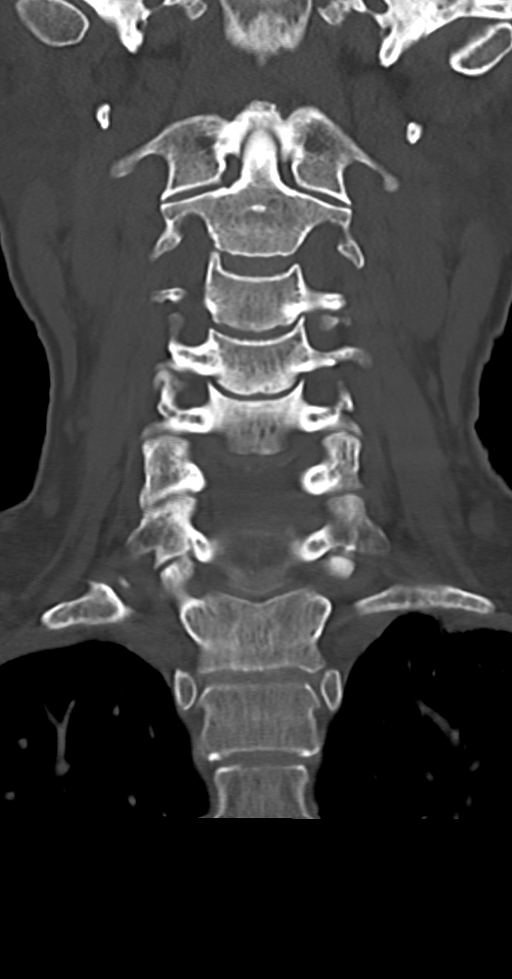
[im 32/53  bone]
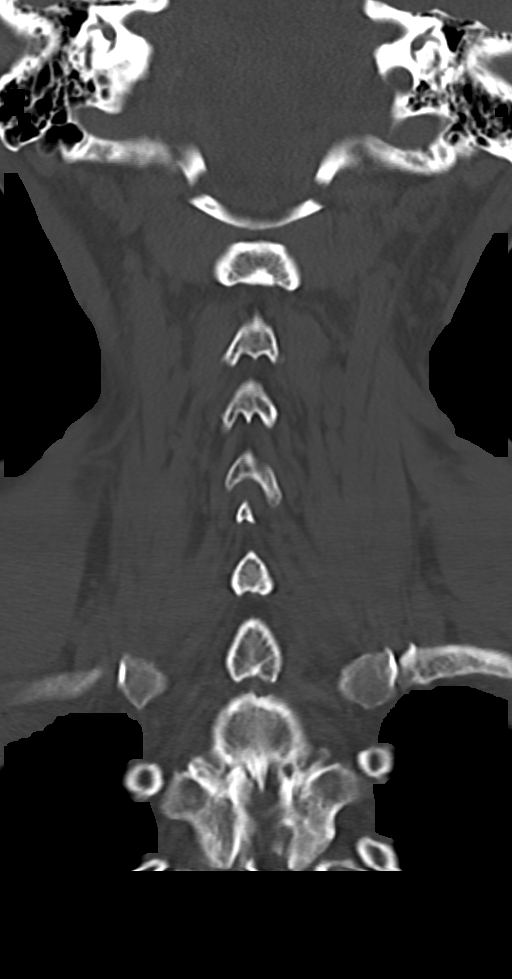

[Series 8: orthogonal axials · axial · 0.22mm/px · z∈[-249,-168]mm · 2 of 110 slices shown, 3 images]
[im 32/110  soft-tissue]
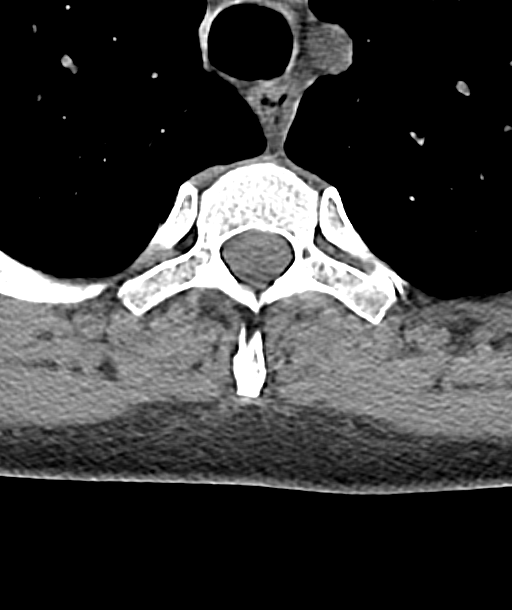
[im 32/110  bone]
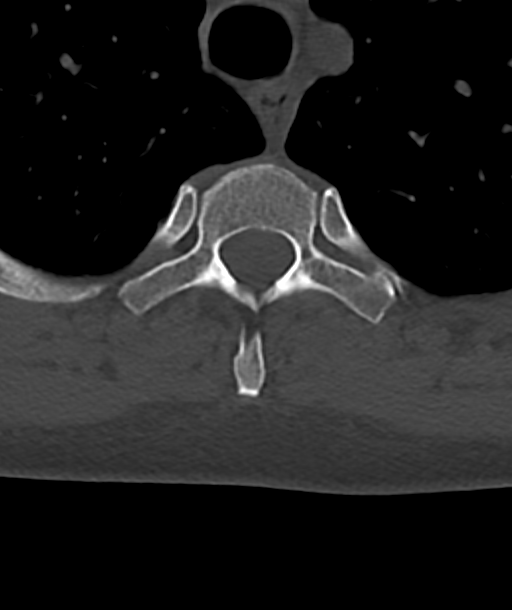
[im 78/110  bone]
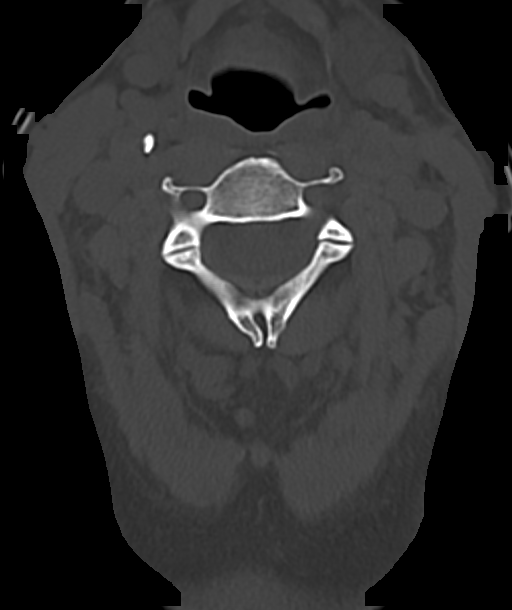

[10 of 33 positions shown; findings below may reference images not displayed]

FINDINGS: Alignment: Straightening of the expected cervical lordosis. Trace
grade 1 anterolisthesis at C3-C4 and C4-C5.

Skull base and vertebrae: The basion-dental and atlanto-dental
intervals are maintained.No evidence of acute fracture to the
cervical spine.

Soft tissues and spinal canal: No prevertebral fluid or swelling. No
visible canal hematoma.

Disc levels:

Multilevel disc space narrowing, greatest at C3-C4, C4-C5 and C5-C6
(mild to moderate at these levels).

C2-C3: Shallow disc bulge. No significant spinal canal stenosis. No
significant bony neural foraminal narrowing.

C3-C4: Trace grade 1 anterolisthesis. Slight disc uncovering and
disc bulge. Mild uncinate hypertrophy on the left. Facet arthrosis
on the left. No significant spinal canal stenosis. Mild-to-moderate
left bony neural foraminal narrowing.

C4-C5: Trace grade 1 anterolisthesis. Slight disc uncovering and
disc bulge. Bilateral uncovertebral hypertrophy. Minimal facet
arthrosis on the right. No significant spinal canal stenosis.
Mild-to-moderate bony neural foraminal narrowing on the right.

C5-C6: Shallow disc bulge. No significant spinal canal stenosis. No
significant bony neural foraminal narrowing.

C6-C7: Shallow disc bulge. No significant spinal canal stenosis. No
significant bony neural foraminal narrowing.

C7-T1: No significant disc herniation or spinal canal stenosis. No
significant bony neural foraminal narrowing.

Upper chest: Emphysema. No consolidation within the imaged lung
apices.
IMPRESSION: 1. No evidence of acute fracture to the cervical spine.
2. Nonspecific straightening of the expected cervical lordosis.
3. Minimal grade 1 anterolisthesis at C3-C4 and C4-C5.
4. Cervical spondylosis, as outlined. No appreciable significant
spinal canal stenosis. Bony neural foraminal narrowing on the left
at C3-C4, and on the right at C4-C5 (mild to moderate at these
sites).
5. Emphysema ([BQ]-[BQ]).

## 2022-05-01 IMAGING — MR MR CERVICAL SPINE W/O CM
5 series · 40 of 48 positions shown · non-contrast
Comparison: CT cervical spine [DATE]. MRI cervical spine
[DATE]

CLINICAL DATA: Acute cervical myelopathy.  History breast cancer.

EXAM:
MRI CERVICAL SPINE WITHOUT CONTRAST
TECHNIQUE: Multiplanar, multisequence MR imaging of the cervical spine was
performed. No intravenous contrast was administered.

[Series 1: T2 · sagittal · 3.0mm · 0.62mm/px · 6 of 15 slices shown (1 of 2)]
[im 1/15]
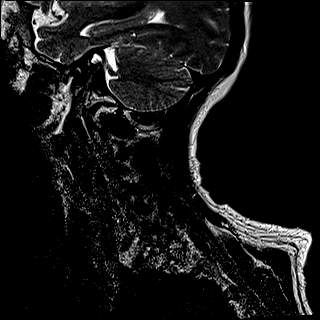
[im 3/15]
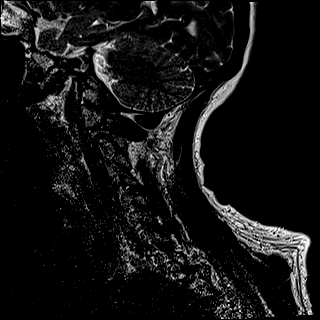
[im 6/15]
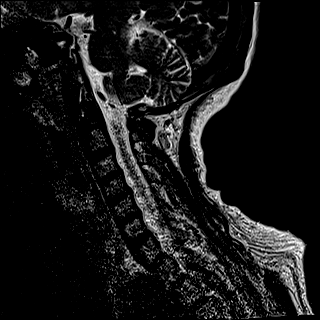
[im 9/15]
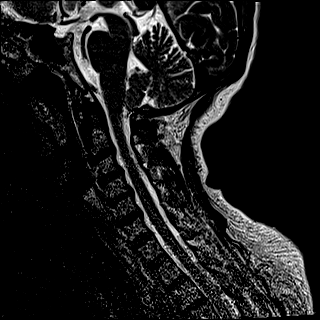
[im 12/15]
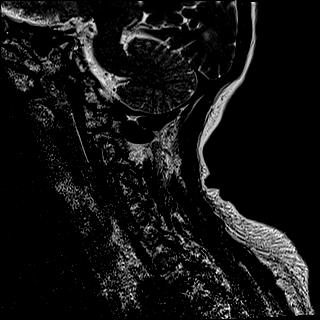
[im 15/15]
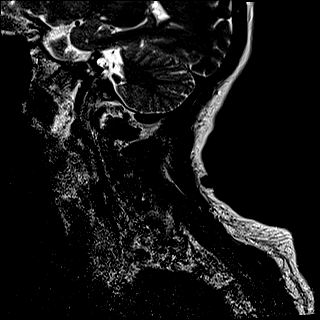

[Series 2: FLAIR · sagittal · 3.0mm · 0.78mm/px · 7 of 15 slices shown]
[im 1/15]
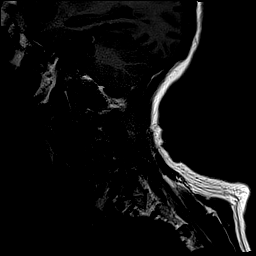
[im 3/15]
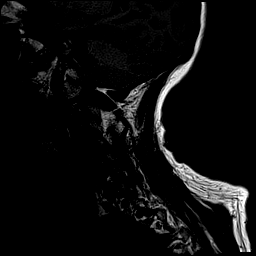
[im 5/15]
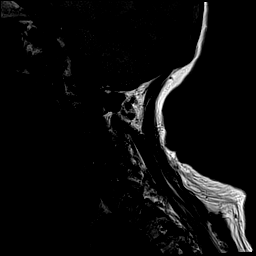
[im 8/15]
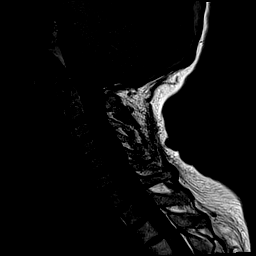
[im 10/15]
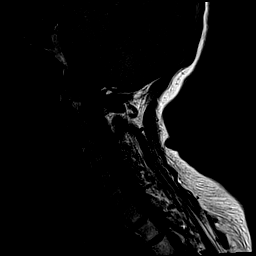
[im 12/15]
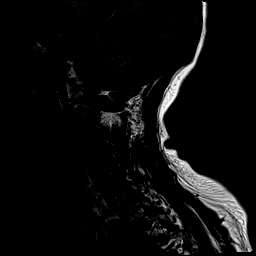
[im 15/15]
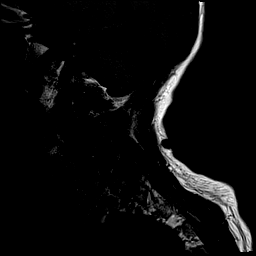

[Series 3: STIR · sagittal · 3.0mm · 0.62mm/px · 7 of 15 slices shown]
[im 1/15]
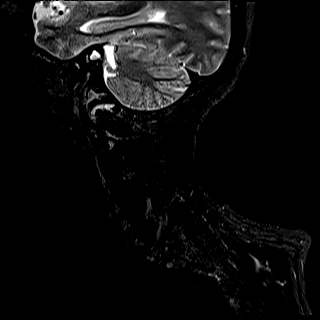
[im 3/15]
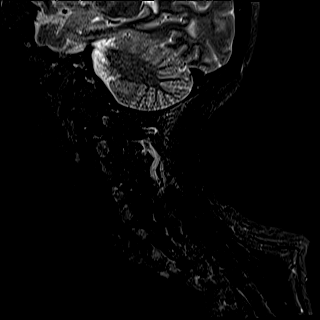
[im 5/15]
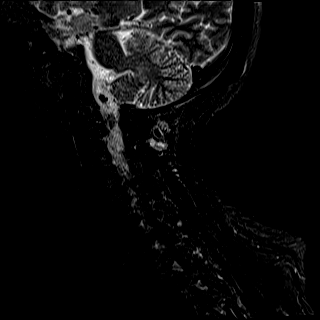
[im 8/15]
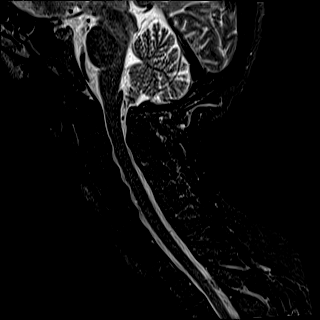
[im 10/15]
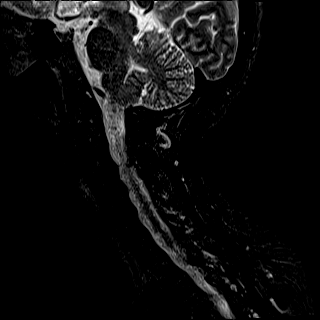
[im 12/15]
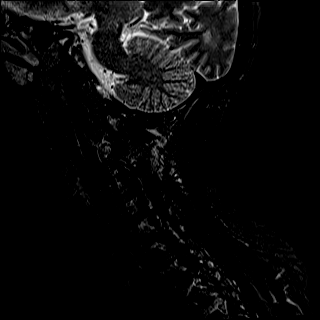
[im 15/15]
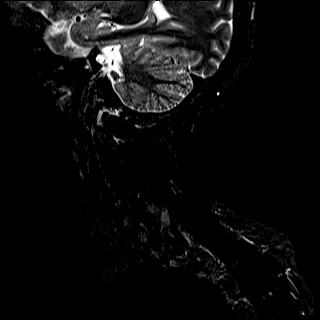

[Series 4: T2 · axial · 3.0mm · 0.70mm/px · z∈[-189,-99]mm · 12 of 29 slices shown (2 of 2)]
[im 1/29]
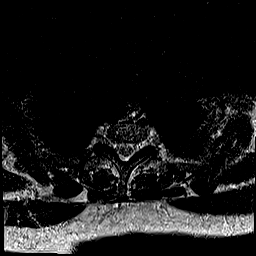
[im 3/29]
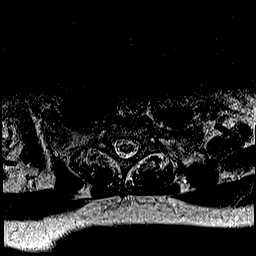
[im 5/29]
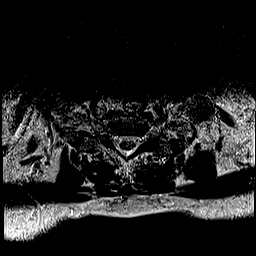
[im 7/29]
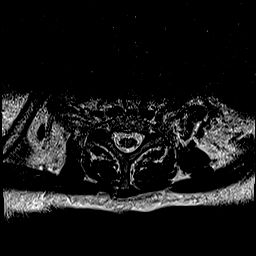
[im 9/29]
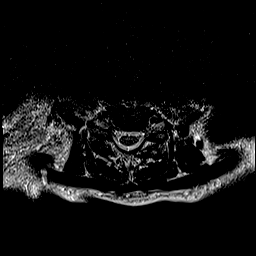
[im 11/29]
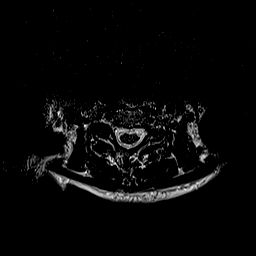
[im 13/29]
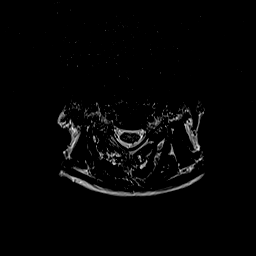
[im 16/29]
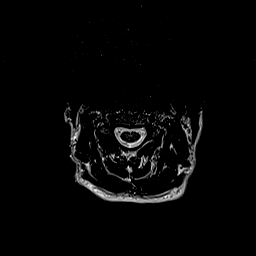
[im 18/29]
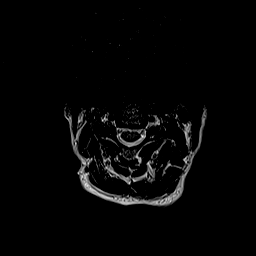
[im 20/29]
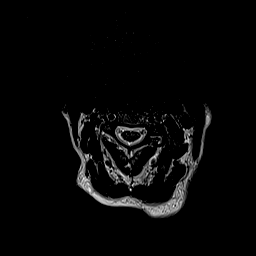
[im 24/29]
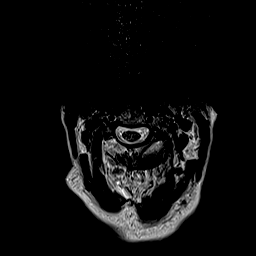
[im 29/29]
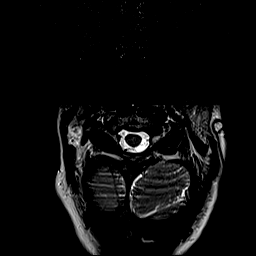

[Series 5: csp ax mpgr · axial · 3.0mm · 0.35mm/px · z∈[-189,-99]mm · 8 of 29 slices shown]
[im 1/29]
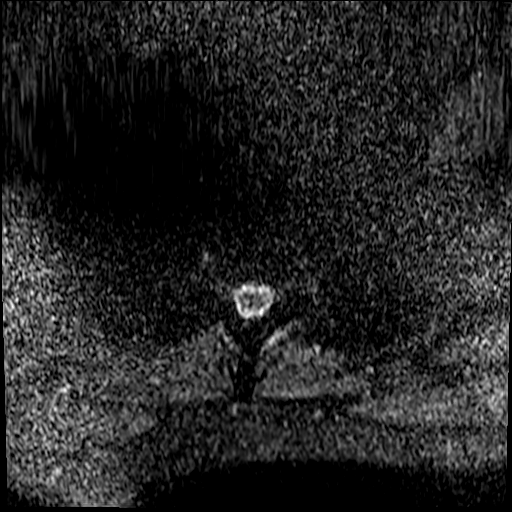
[im 5/29]
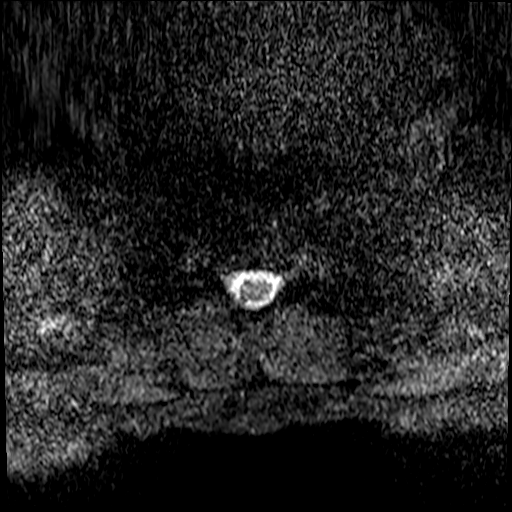
[im 9/29]
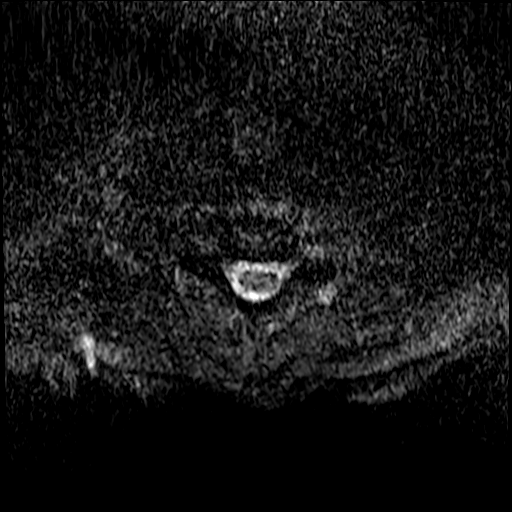
[im 13/29]
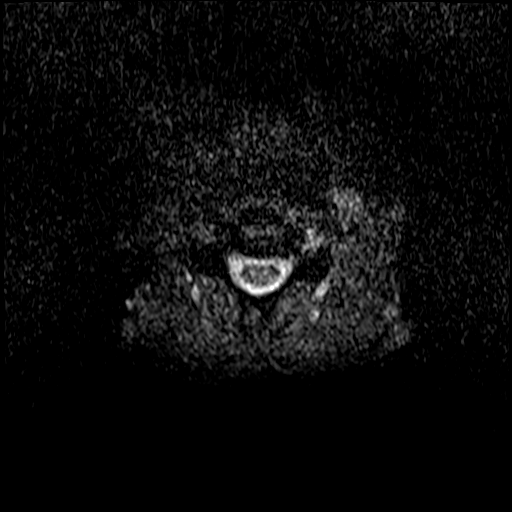
[im 16/29]
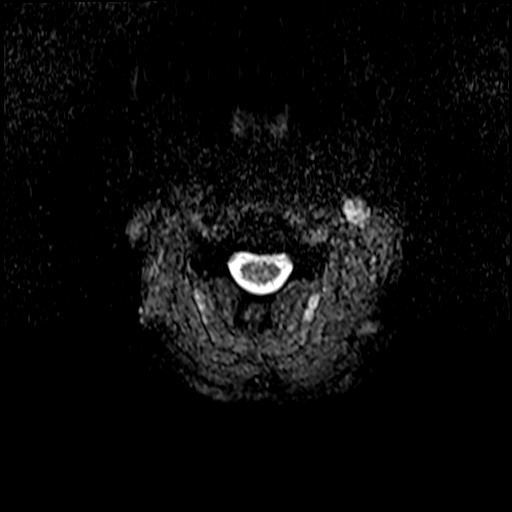
[im 20/29]
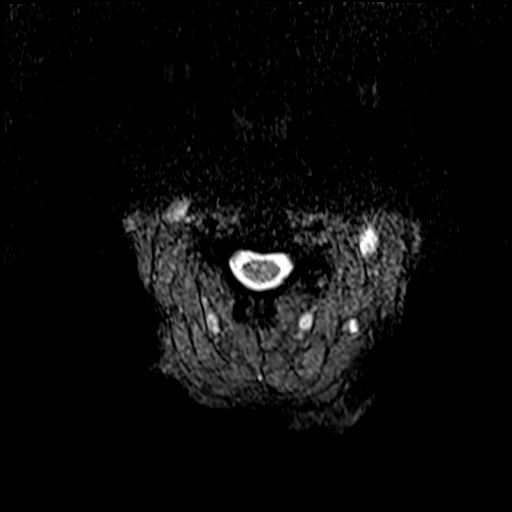
[im 24/29]
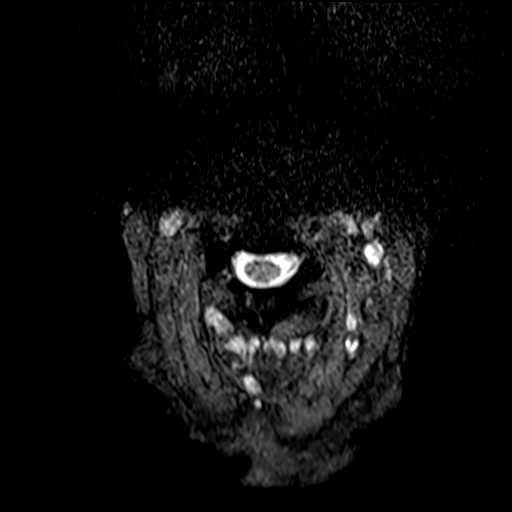
[im 29/29]
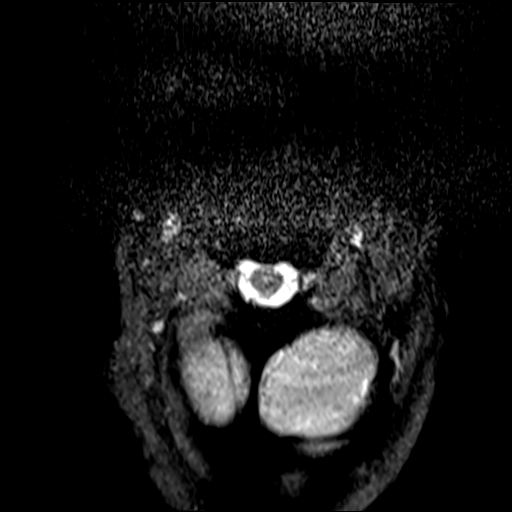

[40 of 48 positions shown; findings below may reference images not displayed]

FINDINGS: Alignment: Suboptimal image quality. Patient claustrophobic and was
not able to tolerate the head coil.

Mild anterolisthesis C3-4.

Vertebrae: Negative for fracture or mass.

Cord: Cord evaluation limited by suboptimal image quality. Allowing
for this, no cord lesion identified.

Posterior Fossa, vertebral arteries, paraspinal tissues: Negative

Disc levels:

C2-3: Mild disc degeneration.  Negative for stenosis

C3-4: Mild disc degeneration.  Negative for stenosis.

C4-5: Mild disc degeneration and mild facet degeneration on the
right. Negative for stenosis

C5-6: Negative

C6-7: Negative

C7-T1: Negative
IMPRESSION: Image quality is suboptimal

No evidence of cervical cord abnormality

Mild cervical degenerative change.

Negative for metastatic disease.

## 2022-05-01 MED ORDER — TRAMADOL HCL 50 MG PO TABS
50.0000 mg | ORAL_TABLET | Freq: Once | ORAL | Status: AC
  Administered 2022-05-01: 50 mg via ORAL
  Filled 2022-05-01: qty 1

## 2022-05-01 MED ORDER — KETOROLAC TROMETHAMINE 15 MG/ML IJ SOLN
15.0000 mg | Freq: Four times a day (QID) | INTRAMUSCULAR | Status: AC | PRN
  Administered 2022-05-01 – 2022-05-02 (×2): 15 mg via INTRAVENOUS
  Filled 2022-05-01 (×2): qty 1

## 2022-05-01 MED ORDER — DIAZEPAM 2 MG PO TABS
2.0000 mg | ORAL_TABLET | Freq: Every day | ORAL | Status: DC | PRN
  Administered 2022-05-01 – 2022-05-02 (×2): 2 mg via ORAL
  Filled 2022-05-01 (×3): qty 1

## 2022-05-01 MED ORDER — MELATONIN 5 MG PO TABS
5.0000 mg | ORAL_TABLET | Freq: Every evening | ORAL | Status: DC | PRN
Start: 2022-05-01 — End: 2022-05-02
  Filled 2022-05-01: qty 1

## 2022-05-01 MED ORDER — ONDANSETRON HCL 4 MG/2ML IJ SOLN
4.0000 mg | Freq: Four times a day (QID) | INTRAMUSCULAR | Status: DC | PRN
  Administered 2022-05-02 (×2): 4 mg via INTRAVENOUS
  Filled 2022-05-01 (×2): qty 2

## 2022-05-01 MED ORDER — FENTANYL CITRATE PF 50 MCG/ML IJ SOSY: 25.0000 ug | PREFILLED_SYRINGE | INTRAMUSCULAR | Status: DC | PRN

## 2022-05-01 MED ORDER — FLUOXETINE HCL 20 MG PO CAPS
20.0000 mg | ORAL_CAPSULE | Freq: Every day | ORAL | Status: DC
  Administered 2022-05-02: 20 mg via ORAL
  Filled 2022-05-01 (×2): qty 1

## 2022-05-01 MED ORDER — ENOXAPARIN SODIUM 40 MG/0.4ML IJ SOSY
40.0000 mg | PREFILLED_SYRINGE | Freq: Every day | INTRAMUSCULAR | Status: DC
  Administered 2022-05-01: 40 mg via SUBCUTANEOUS
  Filled 2022-05-01: qty 0.4

## 2022-05-01 MED ORDER — AZITHROMYCIN 250 MG PO TABS
250.0000 mg | ORAL_TABLET | ORAL | Status: DC
  Administered 2022-05-02: 250 mg via ORAL
  Filled 2022-05-01: qty 1

## 2022-05-01 MED ORDER — ACETAMINOPHEN 650 MG RE SUPP: 650.0000 mg | Freq: Four times a day (QID) | RECTAL | Status: DC | PRN

## 2022-05-01 MED ORDER — FENTANYL CITRATE PF 50 MCG/ML IJ SOSY
50.0000 ug | PREFILLED_SYRINGE | Freq: Once | INTRAMUSCULAR | Status: AC
  Administered 2022-05-01: 50 ug via INTRAVENOUS
  Filled 2022-05-01: qty 1

## 2022-05-01 MED ORDER — UMECLIDINIUM BROMIDE 62.5 MCG/ACT IN AEPB
1.0000 | INHALATION_SPRAY | Freq: Every day | RESPIRATORY_TRACT | Status: DC
  Filled 2022-05-01: qty 7

## 2022-05-01 MED ORDER — NICOTINE 21 MG/24HR TD PT24: 21.0000 mg | MEDICATED_PATCH | Freq: Every day | TRANSDERMAL | Status: DC | PRN

## 2022-05-01 MED ORDER — IPRATROPIUM-ALBUTEROL 0.5-2.5 (3) MG/3ML IN SOLN
3.0000 mL | Freq: Once | RESPIRATORY_TRACT | Status: AC
  Administered 2022-05-01: 3 mL via RESPIRATORY_TRACT
  Filled 2022-05-01: qty 3

## 2022-05-01 MED ORDER — FENTANYL CITRATE PF 50 MCG/ML IJ SOSY
25.0000 ug | PREFILLED_SYRINGE | INTRAMUSCULAR | Status: DC | PRN
  Administered 2022-05-01 – 2022-05-02 (×3): 25 ug via INTRAVENOUS
  Filled 2022-05-01 (×3): qty 1

## 2022-05-01 MED ORDER — IPRATROPIUM BROMIDE 0.02 % IN SOLN
0.5000 mg | Freq: Four times a day (QID) | RESPIRATORY_TRACT | Status: DC
Start: 2022-05-01 — End: 2022-05-02
  Administered 2022-05-01: 0.5 mg via RESPIRATORY_TRACT
  Filled 2022-05-01 (×2): qty 2.5

## 2022-05-01 MED ORDER — ONDANSETRON HCL 4 MG/2ML IJ SOLN
4.0000 mg | Freq: Once | INTRAMUSCULAR | Status: AC
Start: 2022-05-01 — End: 2022-05-01
  Administered 2022-05-01: 4 mg via INTRAVENOUS
  Filled 2022-05-01: qty 2

## 2022-05-01 MED ORDER — IPRATROPIUM-ALBUTEROL 0.5-2.5 (3) MG/3ML IN SOLN: 3.0000 mL | Freq: Four times a day (QID) | RESPIRATORY_TRACT | Status: DC | PRN

## 2022-05-01 MED ORDER — ONDANSETRON HCL 4 MG PO TABS: 4.0000 mg | ORAL_TABLET | Freq: Four times a day (QID) | ORAL | Status: DC | PRN

## 2022-05-01 MED ORDER — KETOROLAC TROMETHAMINE 30 MG/ML IJ SOLN
15.0000 mg | Freq: Once | INTRAMUSCULAR | Status: AC
  Administered 2022-05-01: 15 mg via INTRAVENOUS
  Filled 2022-05-01: qty 1

## 2022-05-01 MED ORDER — ACETAMINOPHEN 325 MG PO TABS
650.0000 mg | ORAL_TABLET | Freq: Four times a day (QID) | ORAL | Status: DC | PRN
  Administered 2022-05-02: 650 mg via ORAL
  Filled 2022-05-01: qty 2

## 2022-05-01 MED ORDER — SENNOSIDES-DOCUSATE SODIUM 8.6-50 MG PO TABS: 1.0000 | ORAL_TABLET | Freq: Every evening | ORAL | Status: DC | PRN

## 2022-05-01 MED ORDER — PREDNISONE 10 MG PO TABS
10.0000 mg | ORAL_TABLET | Freq: Every day | ORAL | Status: DC
  Administered 2022-05-02: 10 mg via ORAL
  Filled 2022-05-01: qty 1

## 2022-05-01 MED ORDER — IOHEXOL 350 MG/ML SOLN
75.0000 mL | Freq: Once | INTRAVENOUS | Status: AC | PRN
  Administered 2022-05-01: 75 mL via INTRAVENOUS

## 2022-05-01 MED ORDER — VITAMIN D 25 MCG (1000 UNIT) PO TABS
1000.0000 [IU] | ORAL_TABLET | Freq: Every day | ORAL | Status: DC
  Administered 2022-05-02: 1000 [IU] via ORAL
  Filled 2022-05-01: qty 1

## 2022-05-01 MED ORDER — VITAMIN B-12 1000 MCG PO TABS
500.0000 ug | ORAL_TABLET | Freq: Every day | ORAL | Status: DC
  Administered 2022-05-02: 500 ug via ORAL
  Filled 2022-05-01: qty 1

## 2022-05-01 MED ORDER — FLUTICASONE FUROATE-VILANTEROL 100-25 MCG/ACT IN AEPB
1.0000 | INHALATION_SPRAY | Freq: Every day | RESPIRATORY_TRACT | Status: DC
  Administered 2022-05-02: 1 via RESPIRATORY_TRACT
  Filled 2022-05-01: qty 28

## 2022-05-01 NOTE — ED Notes (Signed)
Dr. Cox at bedside.  

## 2022-05-01 NOTE — ED Notes (Signed)
Pt to MRI

## 2022-05-01 NOTE — Assessment & Plan Note (Addendum)
-   Resumed Valium 2 mg p.o. daily as needed for anxiety

## 2022-05-01 NOTE — ED Triage Notes (Signed)
Patient arrived by EMS from home with c/o difficulty breathing and pain from neck to feet. EMS administered duoneb. A&O x4 with EMS. EMS vitals 172/105 blood pressure

## 2022-05-01 NOTE — Hospital Course (Addendum)
[name] is a 61 year old female with history of severe COPD, O2 supplementation nightly, depression, anxiety, history of breast cancer status post SBRT, tobacco user, who presents to the emergency department for chief concerns of numbness of her bilateral upper and lower extremities.  In the ED, her vitals show temperature of 99.4, respiration rate of 24, heart rate of 110, blood pressure 155/102, SPO2 of 94% on room air.  Serum sodium is 128, potassium 4.4, chloride 93, bicarb 26, BUN of 8, serum creatinine of 0.45, nonfasting blood glucose 123, GFR greater than 60, procalcitonin is less than 0.10, WBC 11.5, hemoglobin 13.3, platelets of 312.  ED treatment DuoNebs one-time dose, fentanyl 50 mcg x 2, Toradol 15 mg IV, ondansetron 4 mg, tramadol 50 mg.

## 2022-05-01 NOTE — H&P (Addendum)
History and Physical   Colleen [name] BTD:[id number] DOB: 1961/08/30 DOA: 05/01/2022  PCP: Remi Haggard, FNP  Patient coming from: Home via EMS  I have personally briefly reviewed patient's old medical records in [place].  Chief Concern: Upper and lower extremity numbness and sharp pain  HPI: [name] is a 61 year old female with history of severe COPD, O2 supplementation nightly, depression, anxiety, history of breast cancer status post SBRT, tobacco user, who presents to the emergency department for chief concerns of numbness of her bilateral upper and lower extremities.  In the ED, her vitals show temperature of 99.4, respiration rate of 24, heart rate of 110, blood pressure 155/102, SPO2 of 94% on room air.  Serum sodium is 128, potassium 4.4, chloride 93, bicarb 26, BUN of 8, serum creatinine of 0.45, nonfasting blood glucose 123, GFR greater than 60, procalcitonin is less than 0.10, WBC 11.5, hemoglobin 13.3, platelets of 312.  ED treatment DuoNebs one-time dose, fentanyl 50 mcg x 2, Toradol 15 mg IV, ondansetron 4 mg, tramadol 50 mg.  At bedside patient is able to tell me her name, her age, identify her husband at bedside and she knows she is in the hospital.  She reports that she developed numbness and tingling for transition to sharp pain of her upper and lower extremities specifically her hands and feet.  She reports that the symptoms started on 04/30/2022. She denies any trauma to her person.  She denies chest pain, shortness of breath, abdominal pain, dysuria, hematuria, nausea, vomiting, diarrhea.  She reports the pain and tingling with numbness have never been this bad before.  Social history: She lives at home with her husband.  She is a tobacco user, currently smokes half a pack per day.  She denies EtOH and recreational drug use.  She is disabled and previously worked in housekeeping.  ROS: Constitutional: no weight change, no fever ENT/Mouth: no sore  throat, no rhinorrhea Eyes: no eye pain, no vision changes Cardiovascular: no chest pain, no dyspnea,  no edema, no palpitations Respiratory: no cough, no sputum, no wheezing Gastrointestinal: no nausea, no vomiting, no diarrhea, no constipation Genitourinary: no urinary incontinence, no dysuria, no hematuria Musculoskeletal: no arthralgias, no myalgias, bilateral upper and lower extremity and and feet numbness and tingling and sharp pain Skin: no skin lesions, no pruritus, Neuro: + weakness, no loss of consciousness, no syncope Psych: no anxiety, no depression, + decrease appetite Heme/Lymph: no bruising, no bleeding  ED Course: Discussed with emergency medicine provider, patient requiring hospitalization for chief concerns of diffuse severe numbness of her extremities and neck and back pain  Assessment/Plan  Principal Problem:   Numbness and tingling in both hands Active Problems:   COPD exacerbation (HCC)   Depression with anxiety   GAD (generalized anxiety disorder)   Panic attack   Tobacco use disorder   Chronic pain syndrome   COPD (chronic obstructive pulmonary disease) (HCC)   PTSD (post-traumatic stress disorder)   Insomnia due to medical condition   Coronary artery disease   MDD (major depressive disorder), recurrent episode, moderate (HCC)   Assessment and Plan:  * Numbness and tingling in both hands - Per patient she has minimally had these symptoms over the last few years however yesterday it worsened and has been persistent. - The numbness and tingling has progressed to sharp pains of her hands and feet - Neurology service, Dr. Leonel Ramsay has been consulted by EDP who recommended MRI without contrast of the cervical, thoracic and  lumbar spine - We appreciate further recommendations and input by neurology team - We will check B12 - Admit to observation, MedSurg, no telemetry  COPD exacerbation ([place]) - Not in acute exacerbation - Resumed home long-acting  inhaler, Trelegy daily, ipratropium nebulizer 4 times daily - DuoNebs every 6 hours as needed for wheezing and shortness of breath, 3 days ordered  Panic attack - Resumed Valium 2 mg p.o. daily as needed for anxiety  GAD (generalized anxiety disorder) - Fluoxetine 20 mg daily with breakfast - Valium 2 mg p.o. daily as needed for anxiety ordered  Tobacco use disorder - Nicotine patch as needed for nicotine craving ordered  Insomnia due to medical condition - Melatonin 5 mg nightly as needed for sleep  PTSD (post-traumatic stress disorder) - Fluoxetine 20 mg q. breakfast  Chart reviewed.   Hospitalization 04/23/2022-6/20: COPD exacerbation.  Procalcitonin was negative, so no antibiotic.  Discharged home on prednisone taper.  She was evaluated by PT and to have overall deconditioning.  PT recommended short-term rehab, patient declined.  Patient was arranged to discharge on home health PT.  DVT prophylaxis: Enoxaparin Code Status: Full code Diet: Heart healthy Family Communication: Updated spouse at bedside with patient's permission Disposition Plan: Pending clinical course Consults called: Neurology Admission status: MedSurg, observation  Past Medical History:  Diagnosis Date   Anxiety    Asthma    Breast cancer ([place]) 2004   left breast   Chronic back pain    COPD (chronic obstructive pulmonary disease) ([place])    Depression    History of kidney surgery    Personal history of radiation therapy    PTSD (post-traumatic stress disorder)    Past Surgical History:  Procedure Laterality Date   BREAST BIOPSY Left 2004   positive   BREAST BIOPSY Right    neg   BREAST LUMPECTOMY Left 2004   positive   LEFT HEART CATH AND CORONARY ANGIOGRAPHY N/A 11/06/2021   Procedure: LEFT HEART CATH AND CORONARY ANGIOGRAPHY;  Surgeon: Corey Skains, MD;  Location: [facility];  Service: Cardiovascular;  Laterality: N/A;   MASTECTOMY Left    MASTECTOMY     NEPHRECTOMY Left     TUBAL LIGATION     Social History:  reports that she has been smoking cigarettes. She has been smoking an average of .5 packs per day. She has never been exposed to tobacco smoke. She has never used smokeless tobacco. She reports current drug use. She reports that she does not drink alcohol.  Allergies  Allergen Reactions   Desvenlafaxine Anaphylaxis   Morphine And Related Anaphylaxis   Penicillins Anaphylaxis    Has patient had a PCN reaction causing immediate rash, facial/tongue/throat swelling, SOB or lightheadedness with hypotension: Yes Has patient had a PCN reaction causing severe rash involving mucus membranes or skin necrosis: No Has patient had a PCN reaction that required hospitalization: Unknown Has patient had a PCN reaction occurring within the last 10 years: No If all of the above answers are "NO", then may proceed with Cephalosporin use.    Prazosin Other (See Comments)   Tamoxifen Anaphylaxis   Trazodone Anaphylaxis   Clonazepam    Codeine Swelling   Duloxetine Itching   Gabapentin Hives    Rash and swelling.    Hydroxyzine Itching   Lorazepam     Patient feels this medications makes her to sedated and wants to avoid   Paroxetine Hcl Hives   Sulfa Antibiotics Itching   Cephalexin Rash  Family History  Problem Relation Age of Onset   Breast cancer Cousin    Breast cancer Cousin    Bipolar disorder Daughter    Drug abuse Daughter    Alcohol abuse Maternal Grandfather    Family history: Family history reviewed and not pertinent/  Prior to Admission medications   Medication Sig Start Date End Date Taking? Authorizing Provider  albuterol (VENTOLIN HFA) 108 (90 Base) MCG/ACT inhaler Inhale 1-2 puffs into the lungs every 4 (four) hours as needed for cough or wheezing.   Yes [provider]  cholecalciferol (VITAMIN D3) 25 MCG (1000 UNIT) tablet Take 1,000 Units by mouth daily.   Yes [provider]  cyclobenzaprine (FLEXERIL) 10 MG tablet Take 1  tablet (10 mg total) by mouth 3 (three) times daily as needed for muscle spasms. 03/06/22  Yes Gillis Santa, MD  Fluticasone-Umeclidin-Vilant (TRELEGY ELLIPTA) 100-62.5-25 MCG/INH AEPB Inhale 1 puff into the lungs daily.   Yes [provider]  ipratropium (ATROVENT) 0.02 % nebulizer solution Take 0.5 mg by nebulization 4 (four) times daily.   Yes [provider]  vitamin B-12 (CYANOCOBALAMIN) 500 MCG tablet Take 500 mcg by mouth daily.   Yes [provider]  azithromycin (ZITHROMAX) 250 MG tablet Take 1 tablet by mouth. Every Monday, Wednesday and friday 04/17/22   [provider]  denosumab (PROLIA) 60 MG/ML SOSY injection Inject 60 mg into the skin every 6 (six) months.    [provider]  diazepam (VALIUM) 2 MG tablet Take 1 tablet (2 mg total) by mouth daily as needed for anxiety. For severe panic symptoms Patient not taking: Reported on 05/01/2022 04/01/22 05/01/22  Ursula Alert, MD  EPINEPHrine 0.3 mg/0.3 mL IJ SOAJ injection Inject 0.3 mg into the muscle as needed for anaphylaxis.    [provider]  FLUoxetine (PROZAC) 20 MG tablet Take 1 tablet (20 mg total) by mouth daily with breakfast. 04/17/22   Ursula Alert, MD  lidocaine (LIDODERM) 5 % Place 1 patch onto the skin daily. Apply to lower back. Remove & Discard patch within 12 hours or as directed by MD 04/25/22   Annita Brod, MD  predniSONE (DELTASONE) 10 MG tablet Take 6 tablets (60 mg total) by mouth daily with breakfast for 1 day, THEN 5 tablets (50 mg total) daily with breakfast for 1 day, THEN 4 tablets (40 mg total) daily with breakfast for 1 day, THEN 2 tablets (20 mg total) daily with breakfast for 1 day, THEN 1 tablet (10 mg total) daily with breakfast for 1 day. 04/26/22 05/01/22  Annita Brod, MD   Physical Exam: Vitals:   05/01/22 1515 05/01/22 1530 05/01/22 1750 05/01/22 1800  BP:   135/81   Pulse: 91 89 94   Resp: 19 (!) 22 18   Temp:   98.4 F (36.9 C)    TempSrc:      SpO2: 98% 90% 91% 95%  Weight:      Height:       Constitutional: appears older than chronological age, anxious, NAD Eyes: PERRL, lids and conjunctivae normal ENMT: Mucous membranes are moist. Posterior pharynx clear of any exudate or lesions. Age-appropriate dentition. Hearing appropriate Neck: normal, supple, no masses, no thyromegaly Respiratory: clear to auscultation bilaterally, no wheezing, no crackles. Normal respiratory effort. No accessory muscle use.  Cardiovascular: Regular rate and rhythm, no murmurs / rubs / gallops. No extremity edema. 2+ pedal pulses. No carotid bruits.  Abdomen: no tenderness, no masses palpated, no hepatosplenomegaly. Bowel  sounds positive.  Musculoskeletal: no clubbing / cyanosis. No joint deformity upper and lower extremities. Good ROM, no contractures, no atrophy. Normal muscle tone.  Skin: no rashes, lesions, ulcers. No induration Neurologic: Sensation intact. Strength 5/5 in all 4.  Psychiatric: Normal judgment and insight. Alert and oriented x 3. Normal mood.   EKG: independently reviewed, showing sinus tachycardia with rate of 104, QTc 459  Chest x-ray on Admission: I personally reviewed and I agree with radiologist reading as below.  MR THORACIC SPINE WO CONTRAST  Result Date: 05/01/2022 CLINICAL DATA:  Mid back pain, neuro deficit. History of breast cancer. EXAM: MRI THORACIC SPINE WITHOUT CONTRAST TECHNIQUE: Multiplanar, multisequence MR imaging of the thoracic spine was performed. No intravenous contrast was administered. COMPARISON:  CT thoracic spine 05/01/2022 FINDINGS: Alignment:  Image quality degraded by motion. Normal alignment Vertebrae: Negative for metastatic disease or fracture. Normal bone marrow. Cord: Cord evaluation limited by motion. No cord lesion identified. Paraspinal and other soft tissues: Negative Disc levels: Multilevel disc degeneration.  Negative for stenosis. Small left-sided disc protrusion T7-8. Small  central disc protrusion T8-9 Small left paracentral disc protrusion T9-10 IMPRESSION: Mild degenerative change thoracic spine Negative for fracture or metastatic disease. Negative for neural impingement. Electronically Signed   By: Franchot Gallo M.D.   On: 05/01/2022 16:41   MR LUMBAR SPINE WO CONTRAST  Result Date: 05/01/2022 CLINICAL DATA:  Low back pain.  History breast cancer EXAM: MRI LUMBAR SPINE WITHOUT CONTRAST TECHNIQUE: Multiplanar, multisequence MR imaging of the lumbar spine was performed. No intravenous contrast was administered. COMPARISON:  CT lumbar spine 05/01/2022. MRI lumbar spine 06/02/2020 FINDINGS: Segmentation:  5 lumbar vertebra Alignment:  Normal Vertebrae: Normal bone marrow. Negative for fracture or metastatic disease. Conus medullaris and cauda equina: Conus extends to the L1-2 level. Conus and cauda equina appear normal. Paraspinal and other soft tissues: Negative for paraspinous mass or adenopathy. Urinary bladder moderately distended. Disc levels: L1-2: Negative L2-3: Negative L3-4: Negative L4-5: Mild disc degeneration with disc bulging. Shallow right foraminal disc protrusion unchanged from the prior study. Mild subarticular stenosis on the right unchanged. Mild facet degeneration. Central canal patent L5-S1: Small central disc protrusion unchanged. Negative for neural impingement. IMPRESSION: 1. Negative for metastatic disease 2. Mild lumbar degenerative change similar to the prior MRI 3. Distended urinary bladder. Electronically Signed   By: Franchot Gallo M.D.   On: 05/01/2022 16:38   MR Cervical Spine Wo Contrast  Result Date: 05/01/2022 CLINICAL DATA:  Acute cervical myelopathy.  History breast cancer. EXAM: MRI CERVICAL SPINE WITHOUT CONTRAST TECHNIQUE: Multiplanar, multisequence MR imaging of the cervical spine was performed. No intravenous contrast was administered. COMPARISON:  CT cervical spine 05/01/2022. MRI cervical spine 09/25/2021 FINDINGS: Alignment:  Suboptimal image quality. Patient claustrophobic and was not able to tolerate the head coil. Mild anterolisthesis C3-4. Vertebrae: Negative for fracture or mass. Cord: Cord evaluation limited by suboptimal image quality. Allowing for this, no cord lesion identified. Posterior Fossa, vertebral arteries, paraspinal tissues: Negative Disc levels: C2-3: Mild disc degeneration.  Negative for stenosis C3-4: Mild disc degeneration.  Negative for stenosis. C4-5: Mild disc degeneration and mild facet degeneration on the right. Negative for stenosis C5-6: Negative C6-7: Negative C7-T1: Negative IMPRESSION: Image quality is suboptimal No evidence of cervical cord abnormality Mild cervical degenerative change. Negative for metastatic disease. Electronically Signed   By: Franchot Gallo M.D.   On: 05/01/2022 16:33   CT Angio Chest/Abd/Pel for Dissection W and/or Wo Contrast  Result Date: 05/01/2022 CLINICAL DATA:  Chest and abdominal pain. EXAM: CT ANGIOGRAPHY CHEST, ABDOMEN AND PELVIS TECHNIQUE: Non-contrast CT of the chest was initially obtained. Multidetector CT imaging through the chest, abdomen and pelvis was performed using the standard protocol during bolus administration of intravenous contrast. Multiplanar reconstructed images and MIPs were obtained and reviewed to evaluate the vascular anatomy. RADIATION DOSE REDUCTION: This exam was performed according to the departmental dose-optimization program which includes automated exposure control, adjustment of the mA and/or kV according to patient size and/or use of iterative reconstruction technique. CONTRAST:  61m OMNIPAQUE IOHEXOL 350 MG/ML SOLN COMPARISON:  February 22, 2022. FINDINGS: CTA CHEST FINDINGS Cardiovascular: Preferential opacification of the thoracic aorta. No evidence of thoracic aortic aneurysm or dissection. Normal heart size. No pericardial effusion. Mediastinum/Nodes: No enlarged mediastinal, hilar, or axillary lymph nodes. Thyroid gland, trachea, and  esophagus demonstrate no significant findings. Lungs/Pleura: No pneumothorax or pleural effusion is noted. Emphysematous disease is noted throughout both lungs. No acute pulmonary disease is noted. Musculoskeletal: No chest wall abnormality. No acute or significant osseous findings. Review of the MIP images confirms the above findings. CTA ABDOMEN AND PELVIS FINDINGS VASCULAR Aorta: Atherosclerosis of abdominal aorta is noted without aneurysm or dissection. Celiac: Patent without evidence of aneurysm, dissection, vasculitis or significant stenosis. SMA: Patent without evidence of aneurysm, dissection, vasculitis or significant stenosis. 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 Right renal artery is patent without evidence of aneurysm, dissection, vasculitis, fibromuscular dysplasia or significant stenosis. IMA: Patent without evidence of aneurysm, dissection, vasculitis or significant stenosis. Inflow: Patent without evidence of aneurysm, dissection, vasculitis or significant stenosis. Veins: No obvious venous abnormality within the limitations of this arterial phase study. Review of the MIP images confirms the above findings. NON-VASCULAR Hepatobiliary: No focal liver abnormality is seen. No gallstones, gallbladder wall thickening, or biliary dilatation. Pancreas: Unremarkable. No pancreatic ductal dilatation or surrounding inflammatory changes. Spleen: Normal in size without focal abnormality. Adrenals/Urinary Tract: Adrenal glands are unremarkable. As noted above, there is either congenital or surgical absence of the left kidney. Right kidney appears normal. No hydronephrosis or renal obstruction is noted. Urinary bladder is unremarkable. Stomach/Bowel: The stomach appears normal. There is no evidence of bowel obstruction or inflammation. The appendix is not clearly visualized. Lymphatic: No significant adenopathy is noted. Reproductive: Uterus and bilateral adnexa are  unremarkable. Other: No abdominal wall hernia or abnormality. No abdominopelvic ascites. Musculoskeletal: No acute or significant osseous findings. Review of the MIP images confirms the above findings. IMPRESSION: There is no evidence of thoracic or abdominal aortic dissection or aneurysm. 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 No acute abnormality is noted in the chest, abdomen or pelvis. Aortic Atherosclerosis (ICD10-I70.0) and Emphysema (ICD10-J43.9). Electronically Signed   By: JMarijo ConceptionM.D.   On: 05/01/2022 14:05   CT Cervical Spine Wo Contrast  Result Date: 05/01/2022 CLINICAL DATA:  Provided history: Neck pain, acute, no red flags. Additional history provided: Patient reports numbness in arms and legs. Difficulty breathing and pain from neck to feet. Hypertensive. EXAM: CT CERVICAL SPINE WITHOUT CONTRAST TECHNIQUE: Multidetector CT imaging of the cervical spine was performed without intravenous contrast. Multiplanar CT image reconstructions were also generated. RADIATION DOSE REDUCTION: This exam was performed according to the departmental dose-optimization program which includes automated exposure control, adjustment of the mA and/or kV according to patient size and/or use of iterative reconstruction technique. COMPARISON:  Cervical spine MRI 09/25/2021. FINDINGS: Alignment: Straightening of the expected  cervical lordosis. Trace grade 1 anterolisthesis at C3-C4 and C4-C5. Skull base and vertebrae: The basion-dental and atlanto-dental intervals are maintained.No evidence of acute fracture to the cervical spine. Soft tissues and spinal canal: No prevertebral fluid or swelling. No visible canal hematoma. Disc levels: Multilevel disc space narrowing, greatest at C3-C4, C4-C5 and C5-C6 (mild to moderate at these levels). C2-C3: Shallow disc bulge. No significant spinal canal stenosis. No significant bony neural foraminal narrowing. C3-C4: Trace grade  1 anterolisthesis. Slight disc uncovering and disc bulge. Mild uncinate hypertrophy on the left. Facet arthrosis on the left. No significant spinal canal stenosis. Mild-to-moderate left bony neural foraminal narrowing. C4-C5: Trace grade 1 anterolisthesis. Slight disc uncovering and disc bulge. Bilateral uncovertebral hypertrophy. Minimal facet arthrosis on the right. No significant spinal canal stenosis. Mild-to-moderate bony neural foraminal narrowing on the right. C5-C6: Shallow disc bulge. No significant spinal canal stenosis. No significant bony neural foraminal narrowing. C6-C7: Shallow disc bulge. No significant spinal canal stenosis. No significant bony neural foraminal narrowing. C7-T1: No significant disc herniation or spinal canal stenosis. No significant bony neural foraminal narrowing. Upper chest: Emphysema. No consolidation within the imaged lung apices. IMPRESSION: 1. No evidence of acute fracture to the cervical spine. 2. Nonspecific straightening of the expected cervical lordosis. 3. Minimal grade 1 anterolisthesis at C3-C4 and C4-C5. 4. Cervical spondylosis, as outlined. No appreciable significant spinal canal stenosis. Bony neural foraminal narrowing on the left at C3-C4, and on the right at C4-C5 (mild to moderate at these sites). 5. Emphysema (ICD10-J43.9). Electronically Signed   By: Kellie Simmering D.O.   On: 05/01/2022 14:01   CT L-SPINE NO CHARGE  Result Date: 05/01/2022 CLINICAL DATA:  Pain EXAM: CT Thoracic and Lumbar spine with contrast TECHNIQUE: Multiplanar CT images of the thoracic and lumbar spine were reconstructed from contemporary CT of the Chest, Abdomen, and Pelvis. RADIATION DOSE REDUCTION: This exam was performed according to the departmental dose-optimization program which includes automated exposure control, adjustment of the mA and/or kV according to patient size and/or use of iterative reconstruction technique. CONTRAST:  No additional COMPARISON:  None Available.  FINDINGS: CT THORACIC SPINE FINDINGS Alignment: No significant listhesis. Vertebrae: Mild degenerative endplate irregularity. Vertebral body heights are otherwise maintained. No acute fracture. No destructive osseous lesion. Paraspinal and other soft tissues: Extra-spinal findings are better evaluated on concurrent source imaging. Disc levels: Mild, facet predominant degenerative changes are present. There is no significant canal narrowing identified. Scattered mild foraminal narrowing due to facet spurring. CT LUMBAR SPINE FINDINGS Segmentation: 5 lumbar type vertebrae. Alignment: No significant listhesis. Vertebrae: Vertebral body heights are maintained. No acute fracture. No destructive osseous lesion. Paraspinal and other soft tissues: Extra-spinal findings are better evaluated on concurrent source imaging. Disc levels: Mild lower lumbar degenerative disc disease. Mild facet hypertrophy. No significant canal stenosis. Foraminal narrowing is present at L4-L5 and L5-S1. IMPRESSION: No acute osseous abnormality.  Mild degenerative changes. Electronically Signed   By: Macy Mis M.D.   On: 05/01/2022 13:55   CT T-SPINE NO CHARGE  Result Date: 05/01/2022 CLINICAL DATA:  Pain EXAM: CT Thoracic and Lumbar spine with contrast TECHNIQUE: Multiplanar CT images of the thoracic and lumbar spine were reconstructed from contemporary CT of the Chest, Abdomen, and Pelvis. RADIATION DOSE REDUCTION: This exam was performed according to the departmental dose-optimization program which includes automated exposure control, adjustment of the mA and/or kV according to patient size and/or use of iterative reconstruction technique. CONTRAST:  No additional COMPARISON:  None Available. FINDINGS: CT THORACIC  SPINE FINDINGS Alignment: No significant listhesis. Vertebrae: Mild degenerative endplate irregularity. Vertebral body heights are otherwise maintained. No acute fracture. No destructive osseous lesion. Paraspinal and other  soft tissues: Extra-spinal findings are better evaluated on concurrent source imaging. Disc levels: Mild, facet predominant degenerative changes are present. There is no significant canal narrowing identified. Scattered mild foraminal narrowing due to facet spurring. CT LUMBAR SPINE FINDINGS Segmentation: 5 lumbar type vertebrae. Alignment: No significant listhesis. Vertebrae: Vertebral body heights are maintained. No acute fracture. No destructive osseous lesion. Paraspinal and other soft tissues: Extra-spinal findings are better evaluated on concurrent source imaging. Disc levels: Mild lower lumbar degenerative disc disease. Mild facet hypertrophy. No significant canal stenosis. Foraminal narrowing is present at L4-L5 and L5-S1. IMPRESSION: No acute osseous abnormality.  Mild degenerative changes. Electronically Signed   By: Macy Mis M.D.   On: 05/01/2022 13:55   DG Chest Portable 1 View  Result Date: 05/01/2022 CLINICAL DATA:  Shortness of breath EXAM: PORTABLE CHEST 1 VIEW COMPARISON:  04/26/2022 FINDINGS: Cardiac size is within normal limits. There are no signs of pulmonary edema or focal pulmonary consolidation. There is no pleural effusion or pneumothorax. IMPRESSION: No active disease. Electronically Signed   By: Elmer Picker M.D.   On: 05/01/2022 09:19    Labs on Admission: I have personally reviewed following labs  CBC: Recent Labs  Lab 05/01/22 0903  WBC 11.5*  NEUTROABS 9.2*  HGB 13.3  HCT 39.7  MCV 91.1  PLT 829   Basic Metabolic Panel: Recent Labs  Lab 04/25/22 0624 04/27/22 0431 04/28/22 0414 04/29/22 0521 05/01/22 0903  NA 128* 126* 125* 131* 128*  K 4.6 4.1 4.3 4.6 4.4  CL 93* 89* 88* 99 93*  CO2 '28 31 29 27 26  '$ GLUCOSE 108* 79 86 81 123*  BUN '9 11 14 12 8  '$ CREATININE 0.33* 0.35* 0.44 0.41* 0.45  CALCIUM 9.0 8.5* 9.1 8.8* 9.1  MG  --   --   --   --  2.4   GFR: Estimated Creatinine Clearance: 53 mL/min (by C-G formula based on SCr of 0.45  mg/dL).  Liver Function Tests: Recent Labs  Lab 05/01/22 0903  AST 24  ALT 24  ALKPHOS 31*  BILITOT 0.8  PROT 7.0  ALBUMIN 4.2   Cardiac Enzymes: Recent Labs  Lab 05/01/22 0903  CKTOTAL 57   Urine analysis:    Component Value Date/Time   COLORURINE STRAW (A) 03/20/2022 1149   APPEARANCEUR CLEAR (A) 03/20/2022 1149   LABSPEC 1.005 03/20/2022 1149   PHURINE 7.0 03/20/2022 1149   GLUCOSEU NEGATIVE 03/20/2022 [place] 03/20/2022 [place] 03/20/2022 1149   KETONESUR NEGATIVE 03/20/2022 1149   PROTEINUR NEGATIVE 03/20/2022 1149   NITRITE NEGATIVE 03/20/2022 1149   LEUKOCYTESUR NEGATIVE 03/20/2022 1149   Dr. Tobie Poet [facility]  If 7PM-7AM, please contact overnight-coverage provider If 7AM-7PM, please contact day coverage provider [email or web address]  05/01/2022, 7:34 PM

## 2022-05-01 NOTE — ED Notes (Signed)
ED Provider at bedside. 

## 2022-05-01 NOTE — Assessment & Plan Note (Signed)
-   Nicotine patch as needed for nicotine craving ordered ?

## 2022-05-01 NOTE — ED Notes (Signed)
Pt to CT

## 2022-05-01 NOTE — ED Notes (Signed)
Pt in CT.

## 2022-05-02 ENCOUNTER — Encounter

## 2022-05-02 DIAGNOSIS — R202 Paresthesia of skin: Secondary | ICD-10-CM | POA: Diagnosis not present

## 2022-05-02 DIAGNOSIS — R200 Anesthesia of skin: Secondary | ICD-10-CM | POA: Diagnosis not present

## 2022-05-02 LAB — BASIC METABOLIC PANEL
Anion gap: 6 (ref 5–15)
BUN: 14 mg/dL (ref 8–23)
CO2: 26 mmol/L (ref 22–32)
Calcium: 9 mg/dL (ref 8.9–10.3)
Chloride: 99 mmol/L (ref 98–111)
Creatinine, Ser: 0.46 mg/dL (ref 0.44–1.00)
GFR, Estimated: >60 mL/min (ref >60)
Glucose, Bld: 100 mg/dL — ABNORMAL HIGH (ref 70–99)
Potassium: 4.7 mmol/L (ref 3.5–5.1)
Sodium: 131 mmol/L — ABNORMAL LOW (ref 135–145)

## 2022-05-02 LAB — CBC
HCT: 38.8 % (ref 36.0–46.0)
Hemoglobin: 13 g/dL (ref 12.0–15.0)
MCH: 30.7 pg (ref 26.0–34.0)
MCHC: 33.5 g/dL (ref 30.0–36.0)
MCV: 91.7 fL (ref 80.0–100.0)
Platelets: 317 10*3/uL (ref 150–400)
RBC: 4.23 MIL/uL (ref 3.87–5.11)
RDW: 13.5 % (ref 11.5–15.5)
WBC: 8.4 10*3/uL (ref 4.0–10.5)
nRBC: 0 % (ref 0.0–0.2)

## 2022-05-02 MED ORDER — IPRATROPIUM BROMIDE 0.02 % IN SOLN
0.5000 mg | Freq: Three times a day (TID) | RESPIRATORY_TRACT | Status: DC
  Administered 2022-05-02 (×2): 0.5 mg via RESPIRATORY_TRACT
  Filled 2022-05-02: qty 2.5

## 2022-05-02 MED ORDER — PREDNISONE 10 MG PO TABS: 10.0000 mg | ORAL_TABLET | Freq: Every day | ORAL | Status: DC

## 2022-05-02 NOTE — Evaluation (Signed)
Physical Therapy Evaluation Patient Details Name: Colleen [name] MRN: [id number] DOB: 1961/09/21 Today's Date: 05/02/2022  History of Present Illness  Pt is a 61 y.o. female presenting to hospital 6/22 with c/o SOB and generalized pain; UE and LE numbness/tingling and sharp pain (h/o these symptoms but acutely worsened).  PMH includes h/o chronic pain, panic attacks, PTSD, COPD, anxiety, h/o L breast CA s/p SBRT.  Recent hospitalization for COPD (discharged 2 days prior).  Clinical Impression  Prior to hospital admission, pt was independent with functional mobility (uses SPC occasionally if needed); lives with her husband in 1 level home with 5-6 STE with B railings.  Pt reporting LBP (aching) during session and L foot arch (burning pain) post ambulation.  Currently pt is modified independent semi-supine to sitting edge of bed; SBA with transfers; and CGA progressing to SBA ambulating 120 feet (no AD use).  Pt reports not using supplemental O2 during the day (only at night) so trialed pt on room air with ambulation: pt's O2 sats decreased to 85-86% on room air with ambulation but recovered fairly quickly to 92-93% on 2 L O2 via nasal cannula (pt's nurse and MD notified).   Pt would benefit from skilled PT to address noted impairments and functional limitations (see below for any additional details).  Upon hospital discharge, pt would benefit from HHPT.    Recommendations for follow up therapy are one component of a multi-disciplinary discharge planning process, led by the attending physician.  Recommendations may be updated based on patient status, additional functional criteria and insurance authorization.  Follow Up Recommendations Home health PT      Assistance Recommended at Discharge Intermittent Supervision/Assistance  Patient can return home with the following  A little help with walking and/or transfers;A little help with bathing/dressing/bathroom;Assistance with cooking/housework;Assist for  transportation;Help with stairs or ramp for entrance    Equipment Recommendations None recommended by PT  Recommendations for Other Services  OT consult    Functional Status Assessment Patient has had a recent decline in their functional status and demonstrates the ability to make significant improvements in function in a reasonable and predictable amount of time.     Precautions / Restrictions Precautions Precautions: Fall Restrictions Weight Bearing Restrictions: No      Mobility  Bed Mobility Overal bed mobility: Modified Independent Bed Mobility: Supine to Sit     Supine to sit: Modified independent (Device/Increase time), HOB elevated     General bed mobility comments: no difficulties noted    Transfers Overall transfer level: Needs assistance Equipment used: None Transfers: Sit to/from Stand, Bed to chair/wheelchair/BSC Sit to Stand: Supervision   Step pivot transfers: Supervision (stand step turn recliner to/from [facility])       General transfer comment: steady safe transfers (x1 trial from bed, x1 trial from recliner, and x1 trial from [facility])    Ambulation/Gait Ambulation/Gait assistance: Min guard, Supervision Gait Distance (Feet): 120 Feet Assistive device: None   Gait velocity: mildly decreased     General Gait Details: partial progressing to step through gait pattern; occasionally putting hand out to touch furniture in room but did not utilize any UE support in hallway; pt declined to trial RW during session  Stairs            Wheelchair Mobility    Modified Rankin (Stroke Patients Only)       Balance Overall balance assessment: Needs assistance Sitting-balance support: No upper extremity supported, Feet supported Sitting balance-Leahy Scale: Normal Sitting balance - Comments: steady sitting  reaching outside BOS   Standing balance support: No upper extremity supported Standing balance-Leahy Scale: Good Standing balance comment: steady  standing reaching within BOS                             Pertinent Vitals/Pain Pain Assessment Pain Assessment: Faces Faces Pain Scale: Hurts little more Pain Location: low back (aching) and L foot arch pain (burning) Pain Descriptors / Indicators: Aching, Burning Pain Intervention(s): Limited activity within patient's tolerance, Monitored during session, Repositioned HR WFL during sessions activities.    Home Living Family/patient expects to be discharged to:: Private residence Living Arrangements: Spouse/significant other Available Help at Discharge: Family;Available 24 hours/day Type of Home: House Home Access: Stairs to enter Entrance Stairs-Rails: Doctor, general practice of Steps: 5-6   Home Layout: One level Home Equipment: Cane - single point      Prior Function Prior Level of Function : Independent/Modified Independent             Mobility Comments: Occasional use of SPC as needed.  No recent falls.  Uses supplemental O2 at night.       Hand Dominance        Extremity/Trunk Assessment   Upper Extremity Assessment Upper Extremity Assessment: Generalized weakness    Lower Extremity Assessment Lower Extremity Assessment: Generalized weakness    Cervical / Trunk Assessment Cervical / Trunk Assessment: Normal  Communication   Communication: No difficulties  Cognition Arousal/Alertness: Awake/alert Behavior During Therapy: Anxious Overall Cognitive Status: Within Functional Limits for tasks assessed                                          General Comments  Nursing cleared pt for participation in physical therapy.  Pt agreeable to PT session.    Exercises     Assessment/Plan    PT Assessment Patient needs continued PT services  PT Problem List Decreased strength;Decreased activity tolerance;Decreased balance;Decreased mobility;Cardiopulmonary status limiting activity       PT Treatment Interventions DME  instruction;Therapeutic exercise;Balance training;Gait training;Stair training;Functional mobility training;Therapeutic activities;Patient/family education    PT Goals (Current goals can be found in the Care Plan section)  Acute Rehab PT Goals Patient Stated Goal: to improve mobility and pain PT Goal Formulation: With patient Time For Goal Achievement: 05/16/22 Potential to Achieve Goals: Good    Frequency Min 2X/week     Co-evaluation               AM-PAC PT "6 Clicks" Mobility  Outcome Measure Help needed turning from your back to your side while in a flat bed without using bedrails?: None Help needed moving from lying on your back to sitting on the side of a flat bed without using bedrails?: None Help needed moving to and from a bed to a chair (including a wheelchair)?: A Little Help needed standing up from a chair using your arms (e.g., wheelchair or bedside chair)?: A Little Help needed to walk in hospital room?: A Little Help needed climbing 3-5 steps with a railing? : A Little 6 Click Score: 20    End of Session Equipment Utilized During Treatment: Gait belt Activity Tolerance: Patient tolerated treatment well Patient left: in chair;with call bell/phone within reach;with chair alarm set Nurse Communication: Mobility status;Precautions;Other (comment) (pt's O2 sats during session) PT Visit Diagnosis: Other abnormalities of gait and mobility (  R26.89);Muscle weakness (generalized) (M62.81)    Time: 1308-6578 PT Time Calculation (min) (ACUTE ONLY): 23 min   Charges:   PT Evaluation $PT Eval Low Complexity: 1 Low PT Treatments $Therapeutic Activity: 8-22 mins       Hendricks Limes, PT 05/02/22, 1:47 PM

## 2022-05-02 NOTE — Plan of Care (Signed)
  Problem: Nutrition: Goal: Adequate nutrition will be maintained Outcome: Progressing   Problem: Coping: Goal: Level of anxiety will decrease Outcome: Not Progressing   

## 2022-05-10 ENCOUNTER — Other Ambulatory Visit: Payer: Self-pay | Admitting: Psychiatry

## 2022-05-10 DIAGNOSIS — F411 Generalized anxiety disorder: Secondary | ICD-10-CM

## 2022-05-10 DIAGNOSIS — F4310 Post-traumatic stress disorder, unspecified: Secondary | ICD-10-CM

## 2022-05-10 DIAGNOSIS — F331 Major depressive disorder, recurrent, moderate: Secondary | ICD-10-CM

## 2022-05-20 ENCOUNTER — Other Ambulatory Visit: Payer: Self-pay | Admitting: Family Medicine

## 2022-05-20 DIAGNOSIS — M810 Age-related osteoporosis without current pathological fracture: Secondary | ICD-10-CM

## 2022-06-13 ENCOUNTER — Other Ambulatory Visit: Payer: Self-pay | Admitting: Psychiatry

## 2022-06-13 DIAGNOSIS — F4310 Post-traumatic stress disorder, unspecified: Secondary | ICD-10-CM

## 2022-06-13 DIAGNOSIS — F411 Generalized anxiety disorder: Secondary | ICD-10-CM

## 2022-06-13 DIAGNOSIS — F331 Major depressive disorder, recurrent, moderate: Secondary | ICD-10-CM

## 2022-06-16 ENCOUNTER — Telehealth: Payer: Self-pay | Admitting: Psychiatry

## 2022-06-16 NOTE — Telephone Encounter (Signed)
Patient had recent admission to [facility] for worsening depression and suicidality.  Patient after discharge did not follow up with our clinic. Will have front desk staff contact patient to clarify whether she wants to continue care here.  Received refill request for this patient through electronic system however will not refill medications since she may have had recent medication changes at her recent inpatient behavioral health admission.

## 2022-06-18 ENCOUNTER — Other Ambulatory Visit: Payer: Self-pay

## 2022-06-18 ENCOUNTER — Observation Stay

## 2022-06-18 ENCOUNTER — Emergency Department

## 2022-06-18 ENCOUNTER — Inpatient Hospital Stay
Admission: EM | Admit: 2022-06-18 | Discharge: 2022-06-21 | DRG: 641 | Disposition: A | Discharge status: Home Health | Attending: Family Medicine | Admitting: Family Medicine

## 2022-06-18 ENCOUNTER — Encounter: Payer: Self-pay | Admitting: Internal Medicine

## 2022-06-18 DIAGNOSIS — K259 Gastric ulcer, unspecified as acute or chronic, without hemorrhage or perforation: Secondary | ICD-10-CM | POA: Diagnosis present

## 2022-06-18 DIAGNOSIS — R634 Abnormal weight loss: Secondary | ICD-10-CM

## 2022-06-18 DIAGNOSIS — Z880 Allergy status to penicillin: Secondary | ICD-10-CM | POA: Diagnosis not present

## 2022-06-18 DIAGNOSIS — I2510 Atherosclerotic heart disease of native coronary artery without angina pectoris: Secondary | ICD-10-CM | POA: Diagnosis not present

## 2022-06-18 DIAGNOSIS — Z882 Allergy status to sulfonamides status: Secondary | ICD-10-CM | POA: Diagnosis not present

## 2022-06-18 DIAGNOSIS — E440 Moderate protein-calorie malnutrition: Principal | ICD-10-CM | POA: Diagnosis present

## 2022-06-18 DIAGNOSIS — R251 Tremor, unspecified: Secondary | ICD-10-CM | POA: Diagnosis present

## 2022-06-18 DIAGNOSIS — Z20822 Contact with and (suspected) exposure to covid-19: Secondary | ICD-10-CM | POA: Diagnosis not present

## 2022-06-18 DIAGNOSIS — R1310 Dysphagia, unspecified: Secondary | ICD-10-CM

## 2022-06-18 DIAGNOSIS — J449 Chronic obstructive pulmonary disease, unspecified: Secondary | ICD-10-CM | POA: Diagnosis present

## 2022-06-18 DIAGNOSIS — R0789 Other chest pain: Secondary | ICD-10-CM

## 2022-06-18 DIAGNOSIS — Z888 Allergy status to other drugs, medicaments and biological substances status: Secondary | ICD-10-CM | POA: Diagnosis not present

## 2022-06-18 DIAGNOSIS — F410 Panic disorder [episodic paroxysmal anxiety] without agoraphobia: Secondary | ICD-10-CM | POA: Diagnosis present

## 2022-06-18 DIAGNOSIS — Z9012 Acquired absence of left breast and nipple: Secondary | ICD-10-CM | POA: Diagnosis not present

## 2022-06-18 DIAGNOSIS — F17210 Nicotine dependence, cigarettes, uncomplicated: Secondary | ICD-10-CM | POA: Diagnosis present

## 2022-06-18 DIAGNOSIS — K648 Other hemorrhoids: Secondary | ICD-10-CM | POA: Diagnosis present

## 2022-06-18 DIAGNOSIS — R54 Age-related physical debility: Secondary | ICD-10-CM | POA: Diagnosis present

## 2022-06-18 DIAGNOSIS — J441 Chronic obstructive pulmonary disease with (acute) exacerbation: Principal | ICD-10-CM

## 2022-06-18 DIAGNOSIS — J9621 Acute and chronic respiratory failure with hypoxia: Secondary | ICD-10-CM | POA: Diagnosis present

## 2022-06-18 DIAGNOSIS — K529 Noninfective gastroenteritis and colitis, unspecified: Secondary | ICD-10-CM | POA: Diagnosis present

## 2022-06-18 DIAGNOSIS — R079 Chest pain, unspecified: Secondary | ICD-10-CM | POA: Diagnosis present

## 2022-06-18 DIAGNOSIS — Z803 Family history of malignant neoplasm of breast: Secondary | ICD-10-CM

## 2022-06-18 DIAGNOSIS — F418 Other specified anxiety disorders: Secondary | ICD-10-CM | POA: Diagnosis present

## 2022-06-18 DIAGNOSIS — F419 Anxiety disorder, unspecified: Secondary | ICD-10-CM | POA: Diagnosis present

## 2022-06-18 DIAGNOSIS — Z6821 Body mass index (BMI) 21.0-21.9, adult: Secondary | ICD-10-CM

## 2022-06-18 DIAGNOSIS — G4701 Insomnia due to medical condition: Secondary | ICD-10-CM | POA: Diagnosis not present

## 2022-06-18 DIAGNOSIS — F411 Generalized anxiety disorder: Secondary | ICD-10-CM | POA: Diagnosis not present

## 2022-06-18 DIAGNOSIS — F331 Major depressive disorder, recurrent, moderate: Secondary | ICD-10-CM | POA: Diagnosis present

## 2022-06-18 DIAGNOSIS — G894 Chronic pain syndrome: Secondary | ICD-10-CM | POA: Diagnosis not present

## 2022-06-18 DIAGNOSIS — M810 Age-related osteoporosis without current pathological fracture: Secondary | ICD-10-CM | POA: Diagnosis present

## 2022-06-18 DIAGNOSIS — E871 Hypo-osmolality and hyponatremia: Secondary | ICD-10-CM | POA: Diagnosis present

## 2022-06-18 DIAGNOSIS — Z7952 Long term (current) use of systemic steroids: Secondary | ICD-10-CM | POA: Diagnosis not present

## 2022-06-18 DIAGNOSIS — Z7951 Long term (current) use of inhaled steroids: Secondary | ICD-10-CM

## 2022-06-18 DIAGNOSIS — Z9981 Dependence on supplemental oxygen: Secondary | ICD-10-CM

## 2022-06-18 DIAGNOSIS — Z923 Personal history of irradiation: Secondary | ICD-10-CM | POA: Diagnosis not present

## 2022-06-18 DIAGNOSIS — J432 Centrilobular emphysema: Secondary | ICD-10-CM | POA: Diagnosis present

## 2022-06-18 DIAGNOSIS — Z881 Allergy status to other antibiotic agents status: Secondary | ICD-10-CM

## 2022-06-18 DIAGNOSIS — K2960 Other gastritis without bleeding: Secondary | ICD-10-CM | POA: Diagnosis present

## 2022-06-18 DIAGNOSIS — F17200 Nicotine dependence, unspecified, uncomplicated: Secondary | ICD-10-CM | POA: Diagnosis present

## 2022-06-18 DIAGNOSIS — Z853 Personal history of malignant neoplasm of breast: Secondary | ICD-10-CM

## 2022-06-18 DIAGNOSIS — K644 Residual hemorrhoidal skin tags: Secondary | ICD-10-CM | POA: Diagnosis present

## 2022-06-18 DIAGNOSIS — F4310 Post-traumatic stress disorder, unspecified: Secondary | ICD-10-CM | POA: Diagnosis not present

## 2022-06-18 DIAGNOSIS — M7918 Myalgia, other site: Secondary | ICD-10-CM | POA: Diagnosis present

## 2022-06-18 DIAGNOSIS — Z79899 Other long term (current) drug therapy: Secondary | ICD-10-CM

## 2022-06-18 LAB — CBC WITH DIFFERENTIAL/PLATELET
Abs Immature Granulocytes: 0.02 10*3/uL (ref 0.00–0.07)
Basophils Absolute: 0.1 10*3/uL (ref 0.0–0.1)
Basophils Relative: 1 %
Eosinophils Absolute: 0.1 10*3/uL (ref 0.0–0.5)
Eosinophils Relative: 1 %
HCT: 45.1 % (ref 36.0–46.0)
Hemoglobin: 14.7 g/dL (ref 12.0–15.0)
Immature Granulocytes: 0 %
Lymphocytes Relative: 31 %
Lymphs Abs: 2.1 10*3/uL (ref 0.7–4.0)
MCH: 29.3 pg (ref 26.0–34.0)
MCHC: 32.6 g/dL (ref 30.0–36.0)
MCV: 90 fL (ref 80.0–100.0)
Monocytes Absolute: 0.5 10*3/uL (ref 0.1–1.0)
Monocytes Relative: 7 %
Neutro Abs: 3.9 10*3/uL (ref 1.7–7.7)
Neutrophils Relative %: 60 %
Platelets: 360 10*3/uL (ref 150–400)
RBC: 5.01 MIL/uL (ref 3.87–5.11)
RDW: 13.1 % (ref 11.5–15.5)
WBC: 6.6 10*3/uL (ref 4.0–10.5)
nRBC: 0 % (ref 0.0–0.2)

## 2022-06-18 LAB — D-DIMER, QUANTITATIVE: D-Dimer, Quant: 0.36 ug/mL-FEU (ref 0.00–0.50)

## 2022-06-18 LAB — COMPREHENSIVE METABOLIC PANEL
ALT: 13 U/L (ref 0–44)
AST: 19 U/L (ref 15–41)
Albumin: 4.1 g/dL (ref 3.5–5.0)
Alkaline Phosphatase: 39 U/L (ref 38–126)
Anion gap: 8 (ref 5–15)
BUN: 14 mg/dL (ref 8–23)
CO2: 25 mmol/L (ref 22–32)
Calcium: 8.7 mg/dL — ABNORMAL LOW (ref 8.9–10.3)
Chloride: 106 mmol/L (ref 98–111)
Creatinine, Ser: 0.5 mg/dL (ref 0.44–1.00)
GFR, Estimated: >60 mL/min (ref >60)
Glucose, Bld: 109 mg/dL — ABNORMAL HIGH (ref 70–99)
Potassium: 3.6 mmol/L (ref 3.5–5.1)
Sodium: 139 mmol/L (ref 135–145)
Total Bilirubin: 0.8 mg/dL (ref 0.3–1.2)
Total Protein: 6.9 g/dL (ref 6.5–8.1)

## 2022-06-18 LAB — TROPONIN I (HIGH SENSITIVITY)
Troponin I (High Sensitivity): 9 ng/L (ref <18)
Troponin I (High Sensitivity): 11 ng/L (ref <18)

## 2022-06-18 LAB — SARS CORONAVIRUS 2 BY RT PCR: SARS Coronavirus 2 by RT PCR: NEGATIVE

## 2022-06-18 MED ORDER — HYDROXYZINE HCL 25 MG PO TABS: 50.0000 mg | ORAL_TABLET | Freq: Three times a day (TID) | ORAL | Status: DC | PRN

## 2022-06-18 MED ORDER — ONDANSETRON HCL 4 MG PO TABS: 4.0000 mg | ORAL_TABLET | Freq: Four times a day (QID) | ORAL | Status: DC | PRN

## 2022-06-18 MED ORDER — IPRATROPIUM-ALBUTEROL 0.5-2.5 (3) MG/3ML IN SOLN
3.0000 mL | Freq: Once | RESPIRATORY_TRACT | Status: AC
  Administered 2022-06-18: 3 mL via RESPIRATORY_TRACT
  Filled 2022-06-18: qty 3

## 2022-06-18 MED ORDER — ACETAMINOPHEN 325 MG PO TABS
650.0000 mg | ORAL_TABLET | Freq: Four times a day (QID) | ORAL | Status: DC | PRN
Start: 2022-06-18 — End: 2022-06-21
  Administered 2022-06-18 – 2022-06-20 (×5): 650 mg via ORAL
  Filled 2022-06-18 (×5): qty 2

## 2022-06-18 MED ORDER — MELATONIN 5 MG PO TABS
5.0000 mg | ORAL_TABLET | Freq: Every evening | ORAL | Status: DC | PRN
  Administered 2022-06-19 – 2022-06-20 (×2): 5 mg via ORAL
  Filled 2022-06-18 (×2): qty 1

## 2022-06-18 MED ORDER — METHYLPREDNISOLONE SODIUM SUCC 125 MG IJ SOLR
125.0000 mg | Freq: Once | INTRAMUSCULAR | Status: AC
  Administered 2022-06-18: 125 mg via INTRAVENOUS
  Filled 2022-06-18: qty 2

## 2022-06-18 MED ORDER — HYDROXYZINE HCL 25 MG PO TABS
50.0000 mg | ORAL_TABLET | Freq: Three times a day (TID) | ORAL | Status: DC | PRN
  Administered 2022-06-18 – 2022-06-21 (×4): 50 mg via ORAL
  Filled 2022-06-18 (×4): qty 2

## 2022-06-18 MED ORDER — LIDOCAINE 5 % EX PTCH
1.0000 | MEDICATED_PATCH | CUTANEOUS | Status: DC
  Administered 2022-06-19 – 2022-06-21 (×3): 1 via TRANSDERMAL
  Filled 2022-06-18 (×3): qty 1

## 2022-06-18 MED ORDER — UMECLIDINIUM BROMIDE 62.5 MCG/ACT IN AEPB
1.0000 | INHALATION_SPRAY | Freq: Every day | RESPIRATORY_TRACT | Status: DC
  Administered 2022-06-19 – 2022-06-21 (×3): 1 via RESPIRATORY_TRACT
  Filled 2022-06-18: qty 7

## 2022-06-18 MED ORDER — HYDROMORPHONE HCL 1 MG/ML IJ SOLN
0.5000 mg | INTRAMUSCULAR | Status: DC | PRN
  Administered 2022-06-18 – 2022-06-19 (×3): 0.5 mg via INTRAVENOUS
  Filled 2022-06-18 (×3): qty 0.5

## 2022-06-18 MED ORDER — METHYLPREDNISOLONE SODIUM SUCC 40 MG IJ SOLR: 40.0000 mg | Freq: Two times a day (BID) | INTRAMUSCULAR | Status: DC

## 2022-06-18 MED ORDER — LORAZEPAM 0.5 MG PO TABS: 0.5000 mg | ORAL_TABLET | Freq: Four times a day (QID) | ORAL | Status: DC | PRN

## 2022-06-18 MED ORDER — NICOTINE 21 MG/24HR TD PT24: 21.0000 mg | MEDICATED_PATCH | Freq: Every day | TRANSDERMAL | Status: DC | PRN

## 2022-06-18 MED ORDER — METOPROLOL TARTRATE 5 MG/5ML IV SOLN: 2.5000 mg | INTRAVENOUS | Status: DC | PRN

## 2022-06-18 MED ORDER — FLUTICASONE FUROATE-VILANTEROL 100-25 MCG/ACT IN AEPB
1.0000 | INHALATION_SPRAY | Freq: Every day | RESPIRATORY_TRACT | Status: DC
  Administered 2022-06-19 – 2022-06-21 (×3): 1 via RESPIRATORY_TRACT
  Filled 2022-06-18: qty 28

## 2022-06-18 MED ORDER — MONTELUKAST SODIUM 10 MG PO TABS
10.0000 mg | ORAL_TABLET | Freq: Every evening | ORAL | Status: DC
  Administered 2022-06-18 – 2022-06-20 (×3): 10 mg via ORAL
  Filled 2022-06-18 (×4): qty 1

## 2022-06-18 MED ORDER — FENTANYL CITRATE PF 50 MCG/ML IJ SOSY
50.0000 ug | PREFILLED_SYRINGE | Freq: Once | INTRAMUSCULAR | Status: AC
  Administered 2022-06-18: 50 ug via INTRAVENOUS
  Filled 2022-06-18: qty 1

## 2022-06-18 MED ORDER — FLUTICASONE PROPIONATE 50 MCG/ACT NA SUSP
1.0000 | Freq: Two times a day (BID) | NASAL | Status: DC
  Administered 2022-06-21: 1 via NASAL
  Filled 2022-06-18 (×2): qty 16

## 2022-06-18 MED ORDER — ACETAMINOPHEN 650 MG RE SUPP
650.0000 mg | Freq: Four times a day (QID) | RECTAL | Status: DC | PRN
Start: 2022-06-18 — End: 2022-06-21

## 2022-06-18 MED ORDER — ENOXAPARIN SODIUM 40 MG/0.4ML IJ SOSY
40.0000 mg | PREFILLED_SYRINGE | Freq: Every day | INTRAMUSCULAR | Status: DC
  Administered 2022-06-18 – 2022-06-20 (×3): 40 mg via SUBCUTANEOUS
  Filled 2022-06-18 (×3): qty 0.4

## 2022-06-18 MED ORDER — KETOROLAC TROMETHAMINE 15 MG/ML IJ SOLN
15.0000 mg | Freq: Once | INTRAMUSCULAR | Status: AC
  Administered 2022-06-18: 15 mg via INTRAVENOUS
  Filled 2022-06-18: qty 1

## 2022-06-18 MED ORDER — LORAZEPAM 2 MG/ML IJ SOLN: 2.0000 mg | Freq: Four times a day (QID) | INTRAMUSCULAR | Status: DC | PRN

## 2022-06-18 MED ORDER — SODIUM CHLORIDE 0.9 % IV BOLUS
500.0000 mL | Freq: Once | INTRAVENOUS | Status: AC
  Administered 2022-06-18: 500 mL via INTRAVENOUS

## 2022-06-18 MED ORDER — LORAZEPAM 2 MG/ML IJ SOLN
0.5000 mg | Freq: Once | INTRAMUSCULAR | Status: AC
Start: 2022-06-18 — End: 2022-06-18
  Administered 2022-06-18: 0.5 mg via INTRAVENOUS
  Filled 2022-06-18: qty 1

## 2022-06-18 MED ORDER — HYDRALAZINE HCL 20 MG/ML IJ SOLN: 5.0000 mg | Freq: Three times a day (TID) | INTRAMUSCULAR | Status: DC | PRN

## 2022-06-18 MED ORDER — ONDANSETRON HCL 4 MG/2ML IJ SOLN
4.0000 mg | Freq: Four times a day (QID) | INTRAMUSCULAR | Status: DC | PRN
  Administered 2022-06-19 – 2022-06-20 (×2): 4 mg via INTRAVENOUS
  Filled 2022-06-18 (×2): qty 2

## 2022-06-18 MED ORDER — LIDOCAINE 5 % EX PTCH
1.0000 | MEDICATED_PATCH | CUTANEOUS | Status: DC
  Administered 2022-06-18: 1 via TRANSDERMAL
  Filled 2022-06-18: qty 1

## 2022-06-18 MED ORDER — SENNOSIDES-DOCUSATE SODIUM 8.6-50 MG PO TABS: 1.0000 | ORAL_TABLET | Freq: Every evening | ORAL | Status: DC | PRN

## 2022-06-18 MED ORDER — FENTANYL CITRATE PF 50 MCG/ML IJ SOSY: 12.5000 ug | PREFILLED_SYRINGE | INTRAMUSCULAR | Status: DC | PRN

## 2022-06-18 MED ORDER — IPRATROPIUM-ALBUTEROL 0.5-2.5 (3) MG/3ML IN SOLN
3.0000 mL | Freq: Three times a day (TID) | RESPIRATORY_TRACT | Status: AC
Start: 2022-06-18 — End: 2022-06-19
  Administered 2022-06-18 – 2022-06-19 (×3): 3 mL via RESPIRATORY_TRACT
  Filled 2022-06-18 (×3): qty 3

## 2022-06-18 MED ORDER — FLUOXETINE HCL 20 MG PO CAPS
40.0000 mg | ORAL_CAPSULE | Freq: Every day | ORAL | Status: DC
  Administered 2022-06-18 – 2022-06-21 (×4): 40 mg via ORAL
  Filled 2022-06-18 (×4): qty 2

## 2022-06-18 MED ORDER — VITAMIN D 25 MCG (1000 UNIT) PO TABS
1000.0000 [IU] | ORAL_TABLET | Freq: Every day | ORAL | Status: DC
  Administered 2022-06-18 – 2022-06-21 (×4): 1000 [IU] via ORAL
  Filled 2022-06-18 (×4): qty 1

## 2022-06-18 MED ORDER — ACETAMINOPHEN 500 MG PO TABS
1000.0000 mg | ORAL_TABLET | Freq: Once | ORAL | Status: AC
  Administered 2022-06-18: 1000 mg via ORAL
  Filled 2022-06-18: qty 2

## 2022-06-18 MED ORDER — LORAZEPAM 0.5 MG PO TABS
0.5000 mg | ORAL_TABLET | Freq: Four times a day (QID) | ORAL | Status: DC | PRN
Start: 2022-06-18 — End: 2022-06-21
  Administered 2022-06-19 – 2022-06-21 (×5): 0.5 mg via ORAL
  Filled 2022-06-18 (×5): qty 1

## 2022-06-18 MED ORDER — VITAMIN B-12 1000 MCG PO TABS
500.0000 ug | ORAL_TABLET | Freq: Every day | ORAL | Status: DC
  Administered 2022-06-19 – 2022-06-21 (×3): 500 ug via ORAL
  Filled 2022-06-18 (×3): qty 1

## 2022-06-18 NOTE — Assessment & Plan Note (Signed)
-   Fluoxetine 40 mg daily resumed

## 2022-06-18 NOTE — Assessment & Plan Note (Deleted)
-   Patient secondary to continued tobacco use - Resumed home Breo Ellipta daily and montelukast 10 mg daily - Solu-Medrol 40 mg IV twice daily starting on 06/19/2022, 2 doses ordered - DuoNebs 3 times daily, 3 doses ordered - Incentive spirometry, flutter valve

## 2022-06-18 NOTE — ED Provider Notes (Signed)
[facility] Provider Note    Event Date/Time   First MD Initiated Contact with Patient 06/18/22 [phone]     (approximate)   History   Chest Pain (R sided chest and full body Right sided pain)   HPI  Colleen [name] is a 61 y.o. female with history of chronic pain, panic attacks, COPD, coronary disease who comes in with concerns for shortness of breath.  Patient reports having right-sided chest pain and is associated with some shortness of breath.  Patient was placed on 2 L of oxygen.  She reports the pain is on the right side of her chest and a pressure sensation.  She states that she has not had any new falls.  She does report some chronic tailbone pain that then flares up and affects her chest.  Reviewed patient's hospital admission from 6/22 where she came in with right-sided pain at that time and had a CT dissection that was negative as well as MRIs that were all negative patient was cleared by neurology.   Physical Exam   Triage Vital Signs: ED Triage Vitals  Enc Vitals Group     BP 06/18/22 0942 (!) 146/104     Pulse Rate 06/18/22 0942 (!) 125     Resp 06/18/22 0942 (!) 25     Temp 06/18/22 0942 97.9 F (36.6 C)     Temp Source 06/18/22 0942 Oral     SpO2 06/18/22 0941 96 %     Weight 06/18/22 0942 110 lb 3.7 oz (50 kg)     Height --      Head Circumference --      Peak Flow --      Pain Score 06/18/22 0942 10     Pain Loc --      Pain Edu? --      Excl. in [place]? --     Most recent vital signs: Vitals:   06/18/22 0941 06/18/22 0942  BP:  (!) 146/104  Pulse:  (!) 125  Resp:  (!) 25  Temp:  97.9 F (36.6 C)  SpO2: 96% 94%     General: Awake, no distress.  CV:  Good peripheral perfusion.  Resp:  Normal effort.  No wheezing noted Abd:  No distention.  Soft and nontender Other:  Equal strength in arms and legs.  Sensation intact throughout.   ED Results / Procedures / Treatments   Labs (all labs ordered are listed, but only abnormal  results are displayed) Labs Reviewed  COMPREHENSIVE METABOLIC PANEL - Abnormal; Notable for the following components:      Result Value   Glucose, Bld 109 (*)    Calcium 8.7 (*)    All other components within normal limits  SARS CORONAVIRUS 2 BY RT PCR  CBC WITH DIFFERENTIAL/PLATELET  D-DIMER, QUANTITATIVE  TROPONIN I (HIGH SENSITIVITY)     EKG  My interpretation of EKG:  Sinus tachycardia rate of 116 without any ST elevation or T wave inversion other than aVL with normal intervals  RADIOLOGY I have reviewed the xray personally and interpreted no evidence of pneumonia   PROCEDURES:  Critical Care performed: No  .1-3 Lead EKG Interpretation  Performed by: Vanessa [place], MD Authorized by: Vanessa [place], MD     Interpretation: abnormal     ECG rate:  100   ECG rate assessment: tachycardic     Rhythm: sinus tachycardia     Ectopy: none     Conduction: normal  MEDICATIONS ORDERED IN ED: Medications  lidocaine (LIDODERM) 5 % 1 patch (1 patch Transdermal Patch Applied 06/18/22 1110)  sodium chloride 0.9 % bolus 500 mL (has no administration in time range)  LORazepam (ATIVAN) injection 0.5 mg (0.5 mg Intravenous Given 06/18/22 1028)  ketorolac (TORADOL) 15 MG/ML injection 15 mg (15 mg Intravenous Given 06/18/22 1111)  acetaminophen (TYLENOL) tablet 1,000 mg (1,000 mg Oral Given 06/18/22 1110)  fentaNYL (SUBLIMAZE) injection 50 mcg (50 mcg Intravenous Given 06/18/22 1111)     IMPRESSION / MDM / ASSESSMENT AND PLAN / ED COURSE  I reviewed the triage vital signs and the nursing notes.   Patient's presentation is most consistent with acute presentation with potential threat to life or bodily function.   Patient is here frequently for chest pain and shortness of breath thought to be related to her COPD versus her anxiety.  Patient slightly tachycardic so D-dimer ordered evaluate for PE, cardiac markers to evaluate for ACS x-ray to evaluate for any pneumonia, pneumothorax.   Seems less likely dissection she had a dissection scan recently that was negative for any aneurysms.  I reviewed where patient was admitted recently at the end of June for psychiatry at [facility].  Patient was given 0.5 of Ativan which seemed to calm her down but she was still complaining of the chest pain so patient was given 50 of fentanyl, Tylenol, lidocaine patch, Toradol to try to help with pain  D-dimer was negative.  COVID test negative troponin negative CBC reassuring CMP reassuring  Oxygen level but went down to 87% we will give a DuoNeb and some steroids in case this could be associated COPD as well  12:58 PM reevaluated patient and discussed with patient and given the oxygen level went lobe patient given some DuoNebs and steroids.  Discussed discharge versus admission and they were strongly wanting admission due to concerns for how severe the shortness of breath was therefore will discuss with hospital team for admission    The patient is on the cardiac monitor to evaluate for evidence of arrhythmia and/or significant heart rate changes.      FINAL CLINICAL IMPRESSION(S) / ED DIAGNOSES   Final diagnoses:  COPD exacerbation ([place])  Anxiety  Atypical chest pain     Rx / DC Orders   ED Discharge Orders     None        Note:  This document was prepared using Dragon voice recognition software and may include unintentional dictation errors.   Vanessa [place], MD 06/18/22 [phone]

## 2022-06-18 NOTE — Hospital Course (Signed)
[name] is a 61 year old female with history of tobacco use disorder, severe COPD, chronic O2 supplementation nightly chronic pain syndrome, generalized anxiety disorder, depression, history of breast cancer status post SBRT, who presents to the emergency department for chief concerns of generalized and diffuse right-sided pain.  Initial vitals in the emergency department show temperature of 97.9, respiration rate of 25, heart rate of 118, blood pressure 130/94, SpO2 of 94% on 2 L nasal cannula.  Patient had a decrease in O2 saturation of 87% on 2 L.  Serum sodium is 139, potassium 3.6, chloride 106, bicarb 25, BUN of 14, serum creatinine 0.50, GFR greater than 60, nonfasting blood glucose 109, WBC 6.6, hemoglobin 14.7, platelets of 360.  High sensitivity troponin was 9 and increased to 11. D-dimer was negative at 0.36.  COVID by PCR was negative.  ED treatment: Tylenol 1 g p.o., fentanyl 50 mcg, Toradol 15 mg IV, lidocaine patch once, Ativan 0.5 mg IV one-time dose, sodium chloride 500 mL bolus.  EDP also ordered DuoNebs one-time treatment, Solu-Medrol 125 mg IV one-time dose.

## 2022-06-18 NOTE — Plan of Care (Signed)

## 2022-06-18 NOTE — ED Triage Notes (Signed)
From home with [facility]- R sided pain. Entire right side of body hurts. Decreased PO intake over the last month. Pt has been able to walk.  R sided pain has been happening for a few days.   PMH-copd. 150 pr, 4L [place], RA sat 88.

## 2022-06-18 NOTE — Assessment & Plan Note (Signed)
Cessation recommended - Continue nicotine patch

## 2022-06-18 NOTE — H&P (Addendum)
History and Physical   Colleen [name] RKY:[id number] DOB: 05-Feb-1961 DOA: 06/18/2022  PCP: Remi Haggard, FNP  Outpatient Specialists: Dr. Gillis Santa, pain management Patient coming from: Home via EMS  I have personally briefly reviewed patient's old medical records in [place].  Chief Concern: Diffuse right-sided pain  HPI: [name] is a 61 year old female with history of tobacco use disorder, severe COPD, chronic O2 supplementation nightly chronic pain syndrome, generalized anxiety disorder, depression, history of breast cancer status post SBRT, who presents to the emergency department for chief concerns of generalized and diffuse right-sided pain.  Initial vitals in the emergency department show temperature of 97.9, respiration rate of 25, heart rate of 118, blood pressure 130/94, SpO2 of 94% on 2 L nasal cannula.  Patient had a decrease in O2 saturation of 87% on 2 L.  Serum sodium is 139, potassium 3.6, chloride 106, bicarb 25, BUN of 14, serum creatinine 0.50, GFR greater than 60, nonfasting blood glucose 109, WBC 6.6, hemoglobin 14.7, platelets of 360.  High sensitivity troponin was 9 and increased to 11. D-dimer was negative at 0.36.  COVID by PCR was negative.  ED treatment: Tylenol 1 g p.o., fentanyl 50 mcg, Toradol 15 mg IV, lidocaine patch once, Ativan 0.5 mg IV one-time dose, sodium chloride 500 mL bolus.  EDP also ordered DuoNebs one-time treatment, Solu-Medrol 125 mg IV one-time dose.  At bedside she was able to tell me her full name, her age, and she knows she is in the hospital.  She reports that she has generalized right-sided pain specifically on her right ribs and thoracic region.  She denies trauma to her person.  She further endorses that since being discharged from [facility] psychiatry department on 05/22/2022, she has not been wearing her O2 nightly.  Per patient, she was told that she did not have enough carbon dioxide and that she needed to stop wearing  oxygen at night.    She further endorses shortness of breath waking up this morning. She denies cough and fever.  Social history: She lives at home with her husband. She is a current tobacco user, smoking less than 1/2 ppd. She denies etoh and recreational drug use. She is disabled and formerly worked in [employer].  ROS: Constitutional: no weight change, no fever ENT/Mouth: no sore throat, no rhinorrhea Eyes: no eye pain, no vision changes Cardiovascular: no chest pain, + dyspnea,  no edema, no palpitations Respiratory: no cough, no sputum, no wheezing Gastrointestinal: no nausea, no vomiting, no diarrhea, no constipation Genitourinary: no urinary incontinence, no dysuria, no hematuria Musculoskeletal: no arthralgias, + myalgias Skin: no skin lesions, no pruritus, Neuro: + weakness, no loss of consciousness, no syncope Psych: no anxiety, no depression, + decrease appetite Heme/Lymph: no bruising, no bleeding  ED Course: Discussed with emergency medicine provider, patient requiring hospitalization for chief concerns of COPD exacerbation.  Assessment/Plan  Principal Problem:   COPD exacerbation (HCC) Active Problems:   Acute on chronic respiratory failure with hypoxia (HCC)   Depression with anxiety   GAD (generalized anxiety disorder)   Panic attack   Tobacco use disorder   Chronic pain syndrome   PTSD (post-traumatic stress disorder)   Insomnia due to medical condition   Coronary artery disease   History of breast cancer   MDD (major depressive disorder), recurrent episode, moderate (HCC)   Right-sided chest pain   Assessment and Plan:  * COPD exacerbation ([place]) - Patient secondary to continued tobacco use - Resumed home Breo Ellipta  daily and montelukast 10 mg daily - Solu-Medrol 40 mg IV twice daily starting on 06/19/2022, 2 doses ordered - DuoNebs 3 times daily, 3 doses ordered - Incentive spirometry, flutter valve  Acute on chronic respiratory failure with  hypoxia (HCC) - Presumed secondary to not wearing home oxygen nightly - Oxygen therapy, routine continuous to maintain SpO2 greater than 92%  Depression with anxiety - Fluoxetine 40 mg daily resumed  GAD (generalized anxiety disorder) - Resumed fluoxetine 40 mg daily - Lorazepam 0.5 mg p.o. every 6 hours as needed for anxiety, 3 days ordered - Lorazepam 2 mg IV every 6 hours as needed for seizure and anxiety not responsive to p.o. Ativan ordered, 3 doses  Tobacco use disorder - Nicotine patch as needed for nicotine craving ordered  Right-sided chest pain - Low clinical suspicion for ACS due to high sensitive troponin within normal limits and EKG was negative for ischemic changes - CT of the chest without contrast ordered - Query musculoskeletal - Lidocaine patch daily ordered - Fentanyl 12.5 mcg IV every 3 hours as needed for moderate pain, 18 hours ordered; Dilaudid 0.5 mg IV every 4 hours.  For severe pain, 18 hours ordered - AM team to reassess patient at bedside to determine continued opioid pain medication requirement  Insomnia due to medical condition - Melatonin 5 mg nightly as needed for sleep  Chart reviewed.   DVT prophylaxis: Enoxaparin 40 mg subcutaneous nightly Code Status: Full code Diet: Heart healthy Family Communication: Updated spouse at bedside Disposition Plan: Pending clinical course Consults called: None at this time Admission status: Telemetry medical, observation  Past Medical History:  Diagnosis Date   Anxiety    Asthma    Breast cancer ([place]) 2004   left breast   Chronic back pain    COPD (chronic obstructive pulmonary disease) ([place])    Depression    History of kidney surgery    Personal history of radiation therapy    PTSD (post-traumatic stress disorder)    Past Surgical History:  Procedure Laterality Date   BREAST BIOPSY Left 2004   positive   BREAST BIOPSY Right    neg   BREAST LUMPECTOMY Left 2004   positive   LEFT HEART CATH AND  CORONARY ANGIOGRAPHY N/A 11/06/2021   Procedure: LEFT HEART CATH AND CORONARY ANGIOGRAPHY;  Surgeon: Corey Skains, MD;  Location: [place] [facility];  Service: Cardiovascular;  Laterality: N/A;   MASTECTOMY Left    MASTECTOMY     NEPHRECTOMY Left    TUBAL LIGATION     Social History:  reports that she has been smoking cigarettes. She has been smoking an average of .5 packs per day. She has never been exposed to tobacco smoke. She has never used smokeless tobacco. She reports that she does not currently use drugs. She reports that she does not drink alcohol.  Allergies  Allergen Reactions   Desvenlafaxine Anaphylaxis   Morphine And Related Anaphylaxis   Penicillins Anaphylaxis    Has patient had a PCN reaction causing immediate rash, facial/tongue/throat swelling, SOB or lightheadedness with hypotension: Yes Has patient had a PCN reaction causing severe rash involving mucus membranes or skin necrosis: No Has patient had a PCN reaction that required hospitalization: Unknown Has patient had a PCN reaction occurring within the last 10 years: No If all of the above answers are "NO", then may proceed with Cephalosporin use.    Prazosin Other (See Comments)   Tamoxifen Anaphylaxis   Trazodone Anaphylaxis   Clonazepam  Codeine Swelling   Duloxetine Itching   Gabapentin Hives    Rash and swelling.    Hydroxyzine Itching   Lorazepam     Patient feels this medications makes her to sedated and wants to avoid   Paroxetine Hcl Hives   Sulfa Antibiotics Itching   Cephalexin Rash   Family History  Problem Relation Age of Onset   Breast cancer Cousin    Breast cancer Cousin    Bipolar disorder Daughter    Drug abuse Daughter    Alcohol abuse Maternal Grandfather    Family history: Family history reviewed and not pertinent.  Prior to Admission medications   Medication Sig Start Date End Date Taking? Authorizing Provider  albuterol (VENTOLIN HFA) 108 (90 Base) MCG/ACT inhaler  Inhale 1-2 puffs into the lungs every 4 (four) hours as needed for cough or wheezing.    [provider]  azithromycin (ZITHROMAX) 250 MG tablet Take 1 tablet by mouth. Every Monday, Wednesday and friday 04/17/22   [provider]  cholecalciferol (VITAMIN D3) 25 MCG (1000 UNIT) tablet Take 1,000 Units by mouth daily.    [provider]  cyclobenzaprine (FLEXERIL) 10 MG tablet Take 1 tablet (10 mg total) by mouth 3 (three) times daily as needed for muscle spasms. 03/06/22   Gillis Santa, MD  denosumab (PROLIA) 60 MG/ML SOSY injection Inject 60 mg into the skin every 6 (six) months.    [provider]  EPINEPHrine 0.3 mg/0.3 mL IJ SOAJ injection Inject 0.3 mg into the muscle as needed for anaphylaxis.    [provider]  FLUoxetine (PROZAC) 20 MG tablet TAKE 1 TABLET BY MOUTH DAILY WITH BREAKFAST 05/12/22   Ursula Alert, MD  Fluticasone-Umeclidin-Vilant (TRELEGY ELLIPTA) 100-62.5-25 MCG/INH AEPB Inhale 1 puff into the lungs daily.    [provider]  ipratropium (ATROVENT) 0.02 % nebulizer solution Take 0.5 mg by nebulization 4 (four) times daily.    [provider]  lidocaine (LIDODERM) 5 % Place 1 patch onto the skin daily. Apply to lower back. Remove & Discard patch within 12 hours or as directed by MD 04/25/22   Annita Brod, MD  predniSONE (DELTASONE) 10 MG tablet Take 1 tablet (10 mg total) by mouth daily. 05/03/22   Wouk, Ailene Rud, MD  vitamin B-12 (CYANOCOBALAMIN) 500 MCG tablet Take 500 mcg by mouth daily.    [provider]   Physical Exam: Vitals:   06/18/22 1330 06/18/22 1346 06/18/22 1400 06/18/22 1430  BP: 119/88  (!) 111/91 123/88  Pulse: 94  96 (!) 111  Resp: 20  (!) 24 (!) 22  Temp:  97.9 F (36.6 C)    TempSrc:  Oral    SpO2: 97%  99% 97%  Weight:       Constitutional: appears age-appropriate, frail, NAD, patient appears anxious Eyes: PERRL, lids and conjunctivae normal ENMT: Mucous membranes  are moist. Posterior pharynx clear of any exudate or lesions. Age-appropriate dentition. Hearing appropriate Neck: normal, supple, no masses, no thyromegaly Respiratory: clear to auscultation bilaterally.diffuse bilateral mild wheezing. no crackles. Normal respiratory effort. No accessory muscle use.  Nasal cannula not worn correctly at bedside Cardiovascular: Regular rate and rhythm, no murmurs / rubs / gallops. No extremity edema. 2+ pedal pulses. No carotid bruits.  Abdomen: no tenderness, no masses palpated, no hepatosplenomegaly. Bowel sounds positive.  Musculoskeletal: no clubbing / cyanosis. No joint deformity upper and lower extremities. Good ROM, no contractures, no atrophy. Normal muscle tone.  Skin: no rashes, lesions, ulcers.  No induration Neurologic: Sensation intact. Strength 5/5 in all 4.  Psychiatric: Alert and oriented x 3.  Anxious mood  EKG: independently reviewed, showing sinus tachycardia with rate of 116, QTc 474  Chest x-ray on Admission: I personally reviewed and I agree with radiologist reading as below.  DG Chest Portable 1 View  Result Date: 06/18/2022 CLINICAL DATA:  Diffuse right-sided pain. EXAM: PORTABLE CHEST 1 VIEW COMPARISON:  Chest x-ray dated May 01, 2022. FINDINGS: The heart size and mediastinal contours are within normal limits. Both lungs are clear. The visualized skeletal structures are unremarkable. IMPRESSION: No active disease. Electronically Signed   By: Titus Dubin M.D.   On: 06/18/2022 10:13    Labs on Admission: I have personally reviewed following labs CBC: Recent Labs  Lab 06/18/22 0947  WBC 6.6  NEUTROABS 3.9  HGB 14.7  HCT 45.1  MCV 90.0  PLT 915   Basic Metabolic Panel: Recent Labs  Lab 06/18/22 0947  NA 139  K 3.6  CL 106  CO2 25  GLUCOSE 109*  BUN 14  CREATININE 0.50  CALCIUM 8.7*   GFR: Estimated Creatinine Clearance: 52.4 mL/min (by C-G formula based on SCr of 0.5 mg/dL).  Liver Function Tests: Recent Labs   Lab 06/18/22 0947  AST 19  ALT 13  ALKPHOS 39  BILITOT 0.8  PROT 6.9  ALBUMIN 4.1   Urine analysis:    Component Value Date/Time   COLORURINE STRAW (A) 03/20/2022 1149   APPEARANCEUR CLEAR (A) 03/20/2022 1149   LABSPEC 1.005 03/20/2022 1149   PHURINE 7.0 03/20/2022 1149   GLUCOSEU NEGATIVE 03/20/2022 [place] 03/20/2022 [place] 03/20/2022 [place] 03/20/2022 1149   PROTEINUR NEGATIVE 03/20/2022 1149   NITRITE NEGATIVE 03/20/2022 [place] 03/20/2022 1149   Dr. Tobie Poet [facility]  If 7PM-7AM, please contact overnight-coverage provider If 7AM-7PM, please contact day coverage provider [email or web address]  06/18/2022, 4:51 PM

## 2022-06-18 NOTE — Assessment & Plan Note (Signed)
-   Melatonin 5 mg nightly as needed for sleep 

## 2022-06-18 NOTE — Assessment & Plan Note (Addendum)
Troponin and EKG normal.  D-dimer negative.  CT chest shows emphysema, no evidence of mass, rib fracture, pneumothorax, pneumonia, or other explanation for right-sided chest pain.

## 2022-06-18 NOTE — ED Triage Notes (Signed)
Pt taking tylenol for pain last dose last night.

## 2022-06-18 NOTE — ED Notes (Signed)
Pt placed on 2L [place] for sat 92%

## 2022-06-18 NOTE — ED Notes (Signed)
Patient place back on 2L per Jari Pigg, MD

## 2022-06-18 NOTE — ED Notes (Signed)
Patient to Ed c/o overall right side pain "for the past few days." Patient appears anxious upon assessment. No weakness or numbness noted.

## 2022-06-18 NOTE — Assessment & Plan Note (Addendum)
-   Presumed secondary to not wearing home oxygen nightly - Oxygen therapy, routine continuous to maintain SpO2 greater than 92%

## 2022-06-19 ENCOUNTER — Observation Stay

## 2022-06-19 DIAGNOSIS — M7918 Myalgia, other site: Secondary | ICD-10-CM

## 2022-06-19 DIAGNOSIS — J449 Chronic obstructive pulmonary disease, unspecified: Secondary | ICD-10-CM

## 2022-06-19 DIAGNOSIS — R079 Chest pain, unspecified: Secondary | ICD-10-CM | POA: Diagnosis not present

## 2022-06-19 DIAGNOSIS — F418 Other specified anxiety disorders: Secondary | ICD-10-CM

## 2022-06-19 DIAGNOSIS — F411 Generalized anxiety disorder: Secondary | ICD-10-CM

## 2022-06-19 DIAGNOSIS — R251 Tremor, unspecified: Secondary | ICD-10-CM

## 2022-06-19 DIAGNOSIS — I2510 Atherosclerotic heart disease of native coronary artery without angina pectoris: Secondary | ICD-10-CM

## 2022-06-19 DIAGNOSIS — G894 Chronic pain syndrome: Secondary | ICD-10-CM | POA: Diagnosis not present

## 2022-06-19 DIAGNOSIS — I2583 Coronary atherosclerosis due to lipid rich plaque: Secondary | ICD-10-CM

## 2022-06-19 LAB — CBC
HCT: 38.2 % (ref 36.0–46.0)
Hemoglobin: 12.4 g/dL (ref 12.0–15.0)
MCH: 29.5 pg (ref 26.0–34.0)
MCHC: 32.5 g/dL (ref 30.0–36.0)
MCV: 91 fL (ref 80.0–100.0)
Platelets: 288 10*3/uL (ref 150–400)
RBC: 4.2 MIL/uL (ref 3.87–5.11)
RDW: 13.1 % (ref 11.5–15.5)
WBC: 5.5 10*3/uL (ref 4.0–10.5)
nRBC: 0 % (ref 0.0–0.2)

## 2022-06-19 LAB — BASIC METABOLIC PANEL
Anion gap: 5 (ref 5–15)
BUN: 16 mg/dL (ref 8–23)
CO2: 25 mmol/L (ref 22–32)
Calcium: 8.5 mg/dL — ABNORMAL LOW (ref 8.9–10.3)
Chloride: 104 mmol/L (ref 98–111)
Creatinine, Ser: 0.44 mg/dL (ref 0.44–1.00)
GFR, Estimated: >60 mL/min (ref >60)
Glucose, Bld: 83 mg/dL (ref 70–99)
Potassium: 4.3 mmol/L (ref 3.5–5.1)
Sodium: 134 mmol/L — ABNORMAL LOW (ref 135–145)

## 2022-06-19 MED ORDER — IBUPROFEN 400 MG PO TABS
600.0000 mg | ORAL_TABLET | Freq: Four times a day (QID) | ORAL | Status: DC | PRN
Start: 2022-06-19 — End: 2022-06-19
  Administered 2022-06-19: 600 mg via ORAL
  Filled 2022-06-19: qty 2

## 2022-06-19 MED ORDER — KETOROLAC TROMETHAMINE 15 MG/ML IJ SOLN
15.0000 mg | Freq: Four times a day (QID) | INTRAMUSCULAR | Status: DC | PRN
Start: 2022-06-19 — End: 2022-06-21
  Administered 2022-06-19 – 2022-06-21 (×5): 15 mg via INTRAVENOUS
  Filled 2022-06-19 (×5): qty 1

## 2022-06-19 NOTE — Assessment & Plan Note (Signed)
Continue metoprolol. 

## 2022-06-19 NOTE — Assessment & Plan Note (Addendum)
Patient recently had an MRI of the lumbar spine that showed no spondylolisthesis, no stenosis, no herniated disc, or other causes of radicular pain.  She had a repeat x-ray here that was also normal and unremarkable.  On my exam she has negative straight leg raise test, and her pain is primarily in the lumbar paraspinal muscles.  I am somewhat uncertain if this is part of her myofascial syndrome, or if this is a simple paraspinal lumbago, but in either case, she is on appropriate treatments - Trial Robaxin - Start nortriptyline - Physical therapy - Continue acetaminophen NSAIDs

## 2022-06-19 NOTE — Progress Notes (Signed)
  Progress Note   Patient: Colleen [name] SKA:[id number] DOB: 03-07-1961 DOA: 06/18/2022     0 DOS: the patient was seen and examined on 06/19/2022       Brief hospital course: Colleen [name] is a 61 year old female with history of tobacco use disorder, severe COPD, chronic O2 supplementation nightly chronic pain syndrome, generalized anxiety disorder, depression, history of breast cancer status post SBRT, who presents to the emergency department for chief concerns of generalized and diffuse right-sided pain.        Assessment and Plan: * Right-sided chest pain Troponin and EKG normal.  D-dimer negative.  CT chest shows emphysema, no evidence of mass, rib fracture, pneumothorax, pneumonia, or other explanation for right-sided chest pain. - Continue lidocaine patch, Tylenol, ibuprofen - PT eval -Heat pack  Depression with anxiety - Continue Prozac, Ativan  GAD (generalized anxiety disorder)    Tobacco use disorder Cessation recommended - Continue nicotine patch  Chronic pain syndrome Not on opiates at home  Coronary artery disease - Continue metoprolol  COPD (chronic obstructive pulmonary disease) (HCC) Respiratory failure ruled out.  Patient feels that her breathing is at baseline, that she has no increased dyspnea or wheezing than baseline.  Her only complaint is chest pain. - Continue home Breo, Incruse, montelukast - Nebulizers as needed           Subjective: Patient has continued right-sided pain, she has tremors which have been present for the last week.  She also complains of some right buttock pain.     Physical Exam: Vitals:   06/18/22 1956 06/19/22 0332 06/19/22 0741 06/19/22 0826  BP: 103/76 133/81  133/83  Pulse: 91 72  87  Resp: '16 16  18  '$ Temp: 98.1 F (36.7 C) 98.2 F (36.8 C)  98.9 F (37.2 C)  TempSrc: Oral Oral    SpO2: 99% 100% 99% 100%  Weight:      Height:       Thin adult female, lying in bed, appears uncomfortable RRR, no  murmurs, no peripheral edema Respiratory rate normal, lungs clear without rales or wheezes Abdomen soft without tenderness palpation or guarding, no ascites or distention Right buttock is normal, there is no induration, redness, overlying rash.  The right chest is also normal, no skin rashes, no lumps, no pain to palpation of the right side of the chest. Attention normal, affect anxious, judgment and insight appear normal, she has a fine motor tremor which resolves at rest, just in the hands.  Face is symmetric, speech is fluent    Data Reviewed: Troponin normal, D-dimer normal, COVID-negative Comprehensive metabolic panel normal Complete blood count normal CT chest without contrast was reviewed, no significant findings.      Disposition: Status is: Observation Patient was admitted for right-sided chest pain.  So far extensive workup is negative for emergent causes of chest pain.  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  At present she cannot stand, so will order PT evalautaion, made need placement.           Author: Edwin Dada, MD 06/19/2022 12:03 PM  For on call review [email or web address].

## 2022-06-19 NOTE — Assessment & Plan Note (Addendum)
Not on opiates at home, her pain here primarily manifest as right chest pain, see work-up below, and right lower back pain

## 2022-06-19 NOTE — Progress Notes (Addendum)
PT Cancellation Note  Patient Details Name: Colleen [name] MRN: [id number] DOB: Mar 27, 1961   Cancelled Treatment:     Patient refused due to pain. She states that she is wait for pain medication, but she has not received it yet. Patient c/o right sided low back pain that is making it difficult for her to stand and move her RLE. After speaking with nurse, she says that patient is requesting narcotic pain medication, but that she cannot be given this pain medication. Nurse has only seen her mobilize in bed and she has not walked yet.   Bradly Chris PT, DPT  06/19/2022, 12:19 PM

## 2022-06-19 NOTE — Progress Notes (Signed)
PT Cancellation Note  Patient Details Name: Colleen [name] MRN: [id number] DOB: 01-06-1961   Cancelled Treatment:    Reason Eval/Treat Not Completed: Pain limiting ability to participate Attempted to see pt again this afternoon and she continues to have a lot of pain that meds are ostensibly not helping.  She flatly declines PT this date; states depending on what happens tonight she may be able to work with PT.    Kreg Shropshire, DPT 06/19/2022, 2:19 PM

## 2022-06-19 NOTE — Plan of Care (Signed)
  Problem: Respiratory: Goal: Ability to maintain a clear airway will improve Outcome: Progressing   Problem: Respiratory: Goal: Levels of oxygenation will improve Outcome: Progressing   Problem: Respiratory: Goal: Ability to maintain adequate ventilation will improve Outcome: Progressing   

## 2022-06-19 NOTE — Assessment & Plan Note (Signed)
Respiratory failure ruled out.  Patient feels that her breathing is at baseline, that she has no increased dyspnea or wheezing than baseline.  Her only complaint is chest pain. - Continue home Breo, Incruse, montelukast - Nebulizers as needed

## 2022-06-19 NOTE — Assessment & Plan Note (Signed)
Fine motor tremor of hands when outstretched.  Resolves at rest.

## 2022-06-20 DIAGNOSIS — Z803 Family history of malignant neoplasm of breast: Secondary | ICD-10-CM | POA: Diagnosis not present

## 2022-06-20 DIAGNOSIS — F4310 Post-traumatic stress disorder, unspecified: Secondary | ICD-10-CM | POA: Diagnosis present

## 2022-06-20 DIAGNOSIS — R1310 Dysphagia, unspecified: Secondary | ICD-10-CM | POA: Diagnosis not present

## 2022-06-20 DIAGNOSIS — Z888 Allergy status to other drugs, medicaments and biological substances status: Secondary | ICD-10-CM | POA: Diagnosis not present

## 2022-06-20 DIAGNOSIS — E871 Hypo-osmolality and hyponatremia: Secondary | ICD-10-CM | POA: Diagnosis present

## 2022-06-20 DIAGNOSIS — G894 Chronic pain syndrome: Secondary | ICD-10-CM | POA: Diagnosis present

## 2022-06-20 DIAGNOSIS — J449 Chronic obstructive pulmonary disease, unspecified: Secondary | ICD-10-CM | POA: Diagnosis not present

## 2022-06-20 DIAGNOSIS — Z20822 Contact with and (suspected) exposure to covid-19: Secondary | ICD-10-CM | POA: Diagnosis present

## 2022-06-20 DIAGNOSIS — Z853 Personal history of malignant neoplasm of breast: Secondary | ICD-10-CM | POA: Diagnosis not present

## 2022-06-20 DIAGNOSIS — I2510 Atherosclerotic heart disease of native coronary artery without angina pectoris: Secondary | ICD-10-CM | POA: Diagnosis present

## 2022-06-20 DIAGNOSIS — Z9012 Acquired absence of left breast and nipple: Secondary | ICD-10-CM | POA: Diagnosis not present

## 2022-06-20 DIAGNOSIS — R54 Age-related physical debility: Secondary | ICD-10-CM | POA: Diagnosis present

## 2022-06-20 DIAGNOSIS — J432 Centrilobular emphysema: Secondary | ICD-10-CM | POA: Diagnosis present

## 2022-06-20 DIAGNOSIS — Z881 Allergy status to other antibiotic agents status: Secondary | ICD-10-CM | POA: Diagnosis not present

## 2022-06-20 DIAGNOSIS — Z923 Personal history of irradiation: Secondary | ICD-10-CM | POA: Diagnosis not present

## 2022-06-20 DIAGNOSIS — K529 Noninfective gastroenteritis and colitis, unspecified: Secondary | ICD-10-CM | POA: Diagnosis not present

## 2022-06-20 DIAGNOSIS — Z7951 Long term (current) use of inhaled steroids: Secondary | ICD-10-CM | POA: Diagnosis not present

## 2022-06-20 DIAGNOSIS — F411 Generalized anxiety disorder: Secondary | ICD-10-CM | POA: Diagnosis present

## 2022-06-20 DIAGNOSIS — R634 Abnormal weight loss: Secondary | ICD-10-CM | POA: Diagnosis not present

## 2022-06-20 DIAGNOSIS — K259 Gastric ulcer, unspecified as acute or chronic, without hemorrhage or perforation: Secondary | ICD-10-CM | POA: Diagnosis present

## 2022-06-20 DIAGNOSIS — F419 Anxiety disorder, unspecified: Secondary | ICD-10-CM | POA: Diagnosis present

## 2022-06-20 DIAGNOSIS — Z882 Allergy status to sulfonamides status: Secondary | ICD-10-CM | POA: Diagnosis not present

## 2022-06-20 DIAGNOSIS — R1319 Other dysphagia: Secondary | ICD-10-CM | POA: Diagnosis not present

## 2022-06-20 DIAGNOSIS — G4701 Insomnia due to medical condition: Secondary | ICD-10-CM | POA: Diagnosis present

## 2022-06-20 DIAGNOSIS — E440 Moderate protein-calorie malnutrition: Secondary | ICD-10-CM | POA: Diagnosis present

## 2022-06-20 DIAGNOSIS — Z9981 Dependence on supplemental oxygen: Secondary | ICD-10-CM | POA: Diagnosis not present

## 2022-06-20 DIAGNOSIS — F17210 Nicotine dependence, cigarettes, uncomplicated: Secondary | ICD-10-CM | POA: Diagnosis present

## 2022-06-20 DIAGNOSIS — F331 Major depressive disorder, recurrent, moderate: Secondary | ICD-10-CM | POA: Diagnosis present

## 2022-06-20 DIAGNOSIS — Z7952 Long term (current) use of systemic steroids: Secondary | ICD-10-CM | POA: Diagnosis not present

## 2022-06-20 DIAGNOSIS — Z880 Allergy status to penicillin: Secondary | ICD-10-CM | POA: Diagnosis not present

## 2022-06-20 LAB — BASIC METABOLIC PANEL
Anion gap: 6 (ref 5–15)
BUN: 12 mg/dL (ref 8–23)
CO2: 26 mmol/L (ref 22–32)
Calcium: 8.4 mg/dL — ABNORMAL LOW (ref 8.9–10.3)
Chloride: 102 mmol/L (ref 98–111)
Creatinine, Ser: 0.47 mg/dL (ref 0.44–1.00)
GFR, Estimated: >60 mL/min (ref >60)
Glucose, Bld: 94 mg/dL (ref 70–99)
Potassium: 4.4 mmol/L (ref 3.5–5.1)
Sodium: 134 mmol/L — ABNORMAL LOW (ref 135–145)

## 2022-06-20 LAB — CBC
HCT: 39.4 % (ref 36.0–46.0)
Hemoglobin: 13 g/dL (ref 12.0–15.0)
MCH: 30 pg (ref 26.0–34.0)
MCHC: 33 g/dL (ref 30.0–36.0)
MCV: 90.8 fL (ref 80.0–100.0)
Platelets: 295 10*3/uL (ref 150–400)
RBC: 4.34 MIL/uL (ref 3.87–5.11)
RDW: 12.8 % (ref 11.5–15.5)
WBC: 6.6 10*3/uL (ref 4.0–10.5)
nRBC: 0 % (ref 0.0–0.2)

## 2022-06-20 MED ORDER — POLYETHYLENE GLYCOL 3350 17 GM/SCOOP PO POWD
1.0000 | Freq: Once | ORAL | Status: AC
Start: 2022-06-20 — End: 2022-06-20
  Administered 2022-06-20: 255 g via ORAL
  Filled 2022-06-20: qty 255

## 2022-06-20 MED ORDER — METHOCARBAMOL 1000 MG/10ML IJ SOLN
500.0000 mg | Freq: Three times a day (TID) | INTRAMUSCULAR | Status: DC | PRN
  Administered 2022-06-20 (×2): 500 mg via INTRAVENOUS
  Filled 2022-06-20: qty 5
  Filled 2022-06-20: qty 500

## 2022-06-20 MED ORDER — NORTRIPTYLINE HCL 10 MG PO CAPS
10.0000 mg | ORAL_CAPSULE | Freq: Every day | ORAL | Status: DC
  Administered 2022-06-20: 10 mg via ORAL
  Filled 2022-06-20 (×2): qty 1

## 2022-06-20 NOTE — Progress Notes (Signed)
Progress Note   Patient: Colleen [name] QIO:[id number] DOB: April 21, 1961 DOA: 06/18/2022     0 DOS: the patient was seen and examined on 06/20/2022 at 11:10AM      Brief hospital course: [name] is a 61 year old female with history of tobacco use disorder, severe COPD, chronic O2 supplementation nightly chronic pain syndrome, generalized anxiety disorder, depression, history of breast cancer status post SBRT, who presents to the emergency department for chief concerns of generalized and diffuse right-sided pain.        Assessment and Plan: * Dysphagia In further discussion, and dysphagia emerged as the patient's primary complaint today.  Her husband reports and she confirms that she has had "3 meals in total over the last 3 weeks", and lost 40 pounds during that time.  She is also had diarrhea.  She reports that she feels a sensation of food getting "stuck in my throat" - Consult gastroenterology, appreciate expertise, they recommend deferring esophagram and they will proceed with endoscopy tomorrow - Consult dietitian  Chronic pain syndrome Not on opiates at home, her pain here primarily manifest as right chest pain, see work-up below, and right lower back pain  Right-sided chest pain Troponin and EKG normal.  D-dimer negative.  CT chest shows emphysema, no evidence of mass, rib fracture, pneumothorax, pneumonia, or other explanation for right-sided chest pain.  Right buttock pain Patient recently had an MRI of the lumbar spine that showed no spondylolisthesis, no stenosis, no herniated disc, or other causes of radicular pain.  She had a repeat x-ray here that was also normal and unremarkable.  On my exam she has negative straight leg raise test, and her pain is primarily in the lumbar paraspinal muscles.  I am somewhat uncertain if this is part of her myofascial syndrome, or if this is a simple paraspinal lumbago, but in either case, she is on appropriate treatments - Trial  Robaxin - Start nortriptyline - Physical therapy - Continue acetaminophen NSAIDs  Tremor Fine motor tremor of hands when outstretched.  Resolves at rest.    Depression with anxiety - Continue Prozac, Ativan  Coronary artery disease - Continue metoprolol  Tobacco use disorder Cessation recommended - Continue nicotine patch  GAD (generalized anxiety disorder)    COPD (chronic obstructive pulmonary disease) (HCC) Respiratory failure ruled out.  Patient feels that her breathing is at baseline, that she has no increased dyspnea or wheezing than baseline.  Her only complaint is chest pain. - Continue home Breo, Incruse, montelukast - Nebulizers as needed           Subjective: No real improvement in her pain in her right lower back overnight, no confusion, no further right-sided chest pain, no dyspnea, no vomiting, but still dysphagia, minimal oral intake.     Physical Exam: Vitals:   06/19/22 1910 06/20/22 0338 06/20/22 0818 06/20/22 1519  BP: 128/84 132/89 (!) 140/87 102/72  Pulse: 88 81 93 87  Resp: '20 16 16 16  '$ Temp: 97.7 F (36.5 C) 98.1 F (36.7 C) 98.1 F (36.7 C) 98.1 F (36.7 C)  TempSrc: Oral Oral  Oral  SpO2: 96% 98% 98% 97%  Weight:      Height:       Thin adult female, lying in bed, no acute distress RRR, no murmurs, no peripheral edema Respiratory rate normal, lungs clear without rales or wheezes Abdomen soft, no tenderness palpation, no guarding Right-sided paraspinal lumbar tenderness, negative straight leg raise test, she has numbness in the right foot Attention  normal, affect anxious, judgment and insight appear normal  Data Reviewed: Case discussed with gastroenterology Basic metabolic panel normal MRI from June reviewed X-ray of the lumbar spine from yesterday reviewed  Family Communication: Husband at the bedside    Disposition: Status is: Inpatient The patient was initially admitted for right-sided chest pain and right lower  back pain.  Extensive work-up of the suspicion unrevealing, no emergent or correctable conditions have been found, we will try to treat symptomatically and recommend physical therapy.  However today she revealed that she has had dysphagia and 40 pounds weight loss.  In that setting, I am concerned about discharging her without further work-up and so we will consult gastroenterology  Once gastroenterology work-up is completed we have a plan for her nutrition we will plan to discharge hopefully in the next 1 to 2 days        Author: Edwin Dada, MD 06/20/2022 4:41 PM  For on call review [email or web address].

## 2022-06-20 NOTE — Progress Notes (Signed)
Nutrition Brief Note  RD consulted for assessment of nutritional requirements/ status.   Wt Readings from Last 15 Encounters:  06/18/22 50 kg  05/01/22 49.9 kg  04/27/22 52.5 kg  04/15/22 54.4 kg  04/04/22 56.2 kg  04/01/22 51.7 kg  03/30/22 52.7 kg  03/20/22 56.2 kg  03/09/22 52.6 kg  02/22/22 54.8 kg  02/12/22 54.8 kg  02/10/22 54.9 kg  01/28/22 54.9 kg  01/15/22 54.9 kg  01/07/22 56.7 kg   Pt with history of tobacco use disorder, severe COPD, chronic O2 supplementation nightly chronic pain syndrome, generalized anxiety disorder, depression, history of breast cancer status post SBRT, who presents for chief concerns of generalized and diffuse right-sided pain.  Pt admitted with rt sided chest pain.   Reviewed wt hx; pt has experienced a 5.1% wt loss over the past 3 months, which is not significant for time frame.   Medications reviewed and include vitamin D3 and vitamin B-12.   Current diet order is Heart Healthy (liberalize to regular for wider variety of meal selections), patient is consuming approximately 100% of meals at this time. Labs and medications reviewed.   No nutrition interventions warranted at this time. If nutrition issues arise, please consult RD.   Loistine Chance, RD, LDN, [place] Registered Dietitian II Certified Diabetes Care and Education Specialist Please refer to [facility] for RD and/or RD on-call/weekend/after hours pager

## 2022-06-20 NOTE — Evaluation (Signed)
Physical Therapy Evaluation Patient Details Name: Colleen [name] MRN: [id number] DOB: February 09, 1961 Today's Date: 06/20/2022  History of Present Illness  Pt is a 61 y.o. female presenting to hospital 06/18/22 with R sided pain (R sided chest and full body R sided pain) and SOB.  Pt admitted with COPD, acute on chronic respiratory failure with hypoxia, and R sided chest pain.  PMH includes h/o chronic pain, PTSD, COPD, anxiety, h/o L breast CA s/p SBRT, panic attacks, nightly chronic pain syndrome.  Clinical Impression  Prior to hospital admission, pt was independent with ambulation (occasional use of SPC as needed); lives with her husband in 1 level home with 5-6 STE with railings.  Currently pt is modified independent with bed mobility; CGA with transfers; and CGA ambulating 50 feet with RW use.  Pt reporting 10/10 R hip/buttocks pain at rest and with activity during session (nurse reports pt pre-medicated for pain).  Pt reporting R LE feeling like it would give out after 15 feet of ambulation but pt wanted to keep walking and no knee buckling noted; pt appearing very stiff during ambulation and appearing shaky last 10 feet of ambulation; pt requiring 4 short standing rest breaks during ambulation d/t pain; CGA provided for safety.  Pt would benefit from skilled PT to address noted impairments and functional limitations (see below for any additional details).  Upon hospital discharge, pt would benefit from [place] and support from family.    Recommendations for follow up therapy are one component of a multi-disciplinary discharge planning process, led by the attending physician.  Recommendations may be updated based on patient status, additional functional criteria and insurance authorization.  Follow Up Recommendations Home health PT      Assistance Recommended at Discharge Intermittent Supervision/Assistance  Patient can return home with the following  A little help with walking and/or transfers;A little  help with bathing/dressing/bathroom;Assistance with cooking/housework;Assist for transportation;Help with stairs or ramp for entrance    Equipment Recommendations Rolling walker (2 wheels);BSC/3in1  Recommendations for Other Services       Functional Status Assessment Patient has had a recent decline in their functional status and demonstrates the ability to make significant improvements in function in a reasonable and predictable amount of time.     Precautions / Restrictions Precautions Precautions: Fall Restrictions Weight Bearing Restrictions: No      Mobility  Bed Mobility Overal bed mobility: Modified Independent             General bed mobility comments: Supine to/from sitting with mild increased time d/t pain    Transfers Overall transfer level: Needs assistance Equipment used: Rolling walker (2 wheels) Transfers: Sit to/from Stand Sit to Stand: Min guard           General transfer comment: x1 trial from bed; fairly strong/steady stand using RW    Ambulation/Gait Ambulation/Gait assistance: Min guard Gait Distance (Feet): 50 Feet Assistive device: Rolling walker (2 wheels)   Gait velocity: decreased     General Gait Details: decreased B LE step length/foot clearance; pt reporting R LE feeling like it would give out after 15 feet of ambulation but pt wanted to keep walking and no knee buckling noted; pt appearing very stiff during ambulation and appearing shaky last 10 feet of ambulation  Stairs            Wheelchair Mobility    Modified Rankin (Stroke Patients Only)       Balance Overall balance assessment: Needs assistance Sitting-balance support: No upper extremity supported,  Feet supported Sitting balance-Leahy Scale: Good Sitting balance - Comments: steady sitting reaching within BOS   Standing balance support: Bilateral upper extremity supported, During functional activity, Reliant on assistive device for balance Standing  balance-Leahy Scale: Fair Standing balance comment: no loss of balance with ambulation noted                             Pertinent Vitals/Pain Pain Assessment Pain Assessment: 0-10 Pain Score: 10-Worst pain ever Pain Location: R hip/buttocks Pain Descriptors / Indicators: Tender, Sore, Sharp, Aching Pain Intervention(s): Limited activity within patient's tolerance, Monitored during session, Premedicated before session, Repositioned Vitals (HR and O2 on room air) stable and WFL throughout treatment session.    Home Living Family/patient expects to be discharged to:: Private residence Living Arrangements: Spouse/significant other Available Help at Discharge: Family Type of Home: House Home Access: Stairs to enter Entrance Stairs-Rails: Psychiatric nurse of Steps: 5-6   Home Layout: One level [place] - single point      Prior Function Prior Level of Function : Independent/Modified Independent             Mobility Comments: Occasional use of SPC as needed.  No recent falls.  Uses supplemental O2 at night.       Hand Dominance        Extremity/Trunk Assessment   Upper Extremity Assessment Upper Extremity Assessment: Generalized weakness    Lower Extremity Assessment Lower Extremity Assessment: Generalized weakness    Cervical / Trunk Assessment Cervical / Trunk Assessment: Normal  Communication   Communication: No difficulties  Cognition Arousal/Alertness: Awake/alert Behavior During Therapy: Anxious Overall Cognitive Status: Within Functional Limits for tasks assessed                                          General Comments  Nursing cleared pt for participation in physical therapy.  Pt agreeable to PT session.    Exercises     Assessment/Plan    PT Assessment Patient needs continued PT services  PT Problem List Decreased strength;Decreased activity tolerance;Decreased balance;Decreased  mobility;Decreased knowledge of use of DME;Decreased knowledge of precautions;Pain       PT Treatment Interventions DME instruction;Gait training;Stair training;Functional mobility training;Therapeutic activities;Therapeutic exercise;Balance training;Patient/family education    PT Goals (Current goals can be found in the Care Plan section)  Acute Rehab PT Goals Patient Stated Goal: to improve pain PT Goal Formulation: With patient Time For Goal Achievement: 07/04/22 Potential to Achieve Goals: Fair    Frequency Min 2X/week     Co-evaluation               AM-PAC PT "6 Clicks" Mobility  Outcome Measure Help needed turning from your back to your side while in a flat bed without using bedrails?: None Help needed moving from lying on your back to sitting on the side of a flat bed without using bedrails?: None Help needed moving to and from a bed to a chair (including a wheelchair)?: A Little Help needed standing up from a chair using your arms (e.g., wheelchair or bedside chair)?: A Little Help needed to walk in hospital room?: A Little Help needed climbing 3-5 steps with a railing? : A Little 6 Click Score: 20    End of Session Equipment Utilized During Treatment: Gait belt Activity Tolerance: Patient limited by pain Patient  left: in bed (with transport present to take pt for testing) Nurse Communication: Mobility status;Precautions PT Visit Diagnosis: Other abnormalities of gait and mobility (R26.89);Muscle weakness (generalized) (M62.81);Pain Pain - Right/Left: Right Pain - part of body: Hip    Time: 1583-0940 PT Time Calculation (min) (ACUTE ONLY): 16 min   Charges:   PT Evaluation $PT Eval Low Complexity: 1 Low PT Treatments $Therapeutic Activity: 8-22 mins       Leitha Bleak, PT 06/20/22, 4:27 PM

## 2022-06-20 NOTE — Assessment & Plan Note (Signed)
In further discussion, and dysphagia emerged as the patient's primary complaint today.  Her husband reports and she confirms that she has had "3 meals in total over the last 3 weeks", and lost 40 pounds during that time.  She is also had diarrhea.  She reports that she feels a sensation of food getting "stuck in my throat" - Consult gastroenterology, appreciate expertise, they recommend deferring esophagram and they will proceed with endoscopy tomorrow - Consult dietitian

## 2022-06-20 NOTE — Progress Notes (Signed)
Cross Cover Patient ordered 1 mg IV morphine for ongoing severe hip pain and NPO status

## 2022-06-20 NOTE — TOC Initial Note (Signed)
Transition of Care [facility]) - Initial/Assessment Note    Patient Details  Name: Colleen [name] MRN: [id number] Date of Birth: January 22, 1961  [facility]) CM/SW Contact:    Beverly Sessions, RN Phone Number: 06/20/2022, 11:15 AM  Clinical Narrative:                   Admitted for: right sided chest pain Admitted from: Home him husband PCP: [place]   Previous discharge from [facility] patient was set up with [facility]. Per Corene Cornea with [facility] services were declined after patient got home.   PT eval pending.  Discussed option arranging home health services, patient declined    Expected Discharge Plan: Home/Self Care     Patient Goals and CMS Choice        Expected Discharge Plan and Services Expected Discharge Plan: Home/Self Care       Living arrangements for the past 2 months: Single Family Home                                      Prior Living Arrangements/Services Living arrangements for the past 2 months: Single Family Home Lives with:: Spouse                   Activities of Daily Living Home Assistive Devices/Equipment: Cane (specify quad or straight) ADL Screening (condition at time of admission) Patient's cognitive ability adequate to safely complete daily activities?: Yes Is the patient deaf or have difficulty hearing?: No Does the patient have difficulty seeing, even when wearing glasses/contacts?: No Does the patient have difficulty concentrating, remembering, or making decisions?: No Patient able to express need for assistance with ADLs?: Yes Does the patient have difficulty dressing or bathing?: No Independently performs ADLs?: Yes (appropriate for developmental age) Does the patient have difficulty walking or climbing stairs?: No Weakness of Legs: Both Weakness of Arms/Hands: Both  Permission Sought/Granted                  Emotional Assessment              Admission diagnosis:   Anxiety [F41.9] Atypical chest pain [R07.89] COPD exacerbation ([place]) [J44.1] Patient Active Problem List   Diagnosis Date Noted   Tremor 06/19/2022   Right buttock pain 06/19/2022   Right-sided chest pain 06/18/2022   Numbness and tingling in both hands 05/01/2022   MDD (major depressive disorder), recurrent episode, moderate ([place]) 04/17/2022   Depression with anxiety 03/30/2022   Coronary artery disease 03/10/2022   Dependence on nocturnal oxygen therapy 03/10/2022   History of breast cancer 03/10/2022   At risk for prolonged QT interval syndrome 11/19/2021   Hyponatremia 10/19/2021   Tobacco use disorder 06/19/2021   PTSD (post-traumatic stress disorder) 03/04/2021   Cervical myofascial pain syndrome 07/09/2020   Chronic pain syndrome 06/04/2020   Osteopenia of multiple sites 06/04/2020   Age related osteoporosis 06/04/2020   Low back pain 04/27/2020   Panic attack 03/20/2020   COPD (chronic obstructive pulmonary disease) ([place]) 10/11/2018   GAD (generalized anxiety disorder) 10/11/2018   PCP:  Remi Haggard, FNP Pharmacy[facility]   [id number]: [phone]: [phone]    Social Determinants of Health (SDOH) Interventions    Readmission Risk Interventions    03/10/2022  9:51 AM 02/14/2022    1:57 PM 12/06/2021   10:23 AM  Readmission Risk Prevention Plan  Transportation Screening Complete Complete Complete  PCP or Specialist Appt within 5-7 Days   Complete  Medication Review (RN CM)   Complete  Medication Review Press photographer) Complete Complete   PCP or Specialist appointment within 3-5 days of discharge Complete Complete   SW Recovery Care/Counseling Consult Complete    Palliative Care Screening Not Applicable Not [place] Not Applicable Not Applicable

## 2022-06-20 NOTE — Plan of Care (Signed)
Patient axox4 had c/o pain today, but did not want toradol stating it did not help. PR given and provider notified. Provider came and spoke with patient bedside and placed some orders for IV robaxin PRN, this seemed to help with patients pain as she said it had decreased when med was completed. Patient started prep for EGD on 06/21/22. Takes meds whole, 1 assist with ambulation using BSC. Problem: Education: Goal: Knowledge of disease or condition will improve Outcome: Progressing Goal: Knowledge of the prescribed therapeutic regimen will improve Outcome: Progressing Goal: Individualized Educational Video(s) Outcome: Progressing   Problem: Activity: Goal: Ability to tolerate increased activity will improve Outcome: Progressing Goal: Will verbalize the importance of balancing activity with adequate rest periods Outcome: Progressing   Problem: Respiratory: Goal: Ability to maintain a clear airway will improve Outcome: Progressing Goal: Levels of oxygenation will improve Outcome: Progressing Goal: Ability to maintain adequate ventilation will improve Outcome: Progressing   Problem: Education: Goal: Knowledge of General Education information will improve Description: Including pain rating scale, medication(s)/side effects and non-pharmacologic comfort measures Outcome: Progressing   Problem: Health Behavior/Discharge Planning: Goal: Ability to manage health-related needs will improve Outcome: Progressing   Problem: Clinical Measurements: Goal: Ability to maintain clinical measurements within normal limits will improve Outcome: Progressing Goal: Will remain free from infection Outcome: Progressing Goal: Diagnostic test results will improve Outcome: Progressing Goal: Respiratory complications will improve Outcome: Progressing Goal: Cardiovascular complication will be avoided Outcome: Progressing   Problem: Activity: Goal: Risk for activity intolerance will decrease Outcome:  Progressing   Problem: Nutrition: Goal: Adequate nutrition will be maintained Outcome: Progressing   Problem: Coping: Goal: Level of anxiety will decrease Outcome: Progressing   Problem: Elimination: Goal: Will not experience complications related to bowel motility Outcome: Progressing Goal: Will not experience complications related to urinary retention Outcome: Progressing   Problem: Pain Managment: Goal: General experience of comfort will improve Outcome: Progressing   Problem: Safety: Goal: Ability to remain free from injury will improve Outcome: Progressing   Problem: Skin Integrity: Goal: Risk for impaired skin integrity will decrease Outcome: Progressing

## 2022-06-20 NOTE — Consult Note (Signed)
Cephas Darby, MD [address]  [place]  [place], [place] [address]  Main: [phone]  Fax: [phone] Pager: [phone]   Consultation  Referring Provider:     No ref. provider found Primary Care Physician:  Remi Haggard, FNP Primary Gastroenterologist: Althia Forts        Reason for Consultation: Dysphagia, unintentional weight loss  Date of Admission:  06/18/2022 Date of Consultation:  06/20/2022         HPI:   Colleen [name] is a 61 y.o. female with history of tobacco use, severe COPD on home oxygen, chronic pain syndrome, history of breast cancer s/p SBRT, generalized anxiety disorder, depression presented to ER with generalized and diffuse right-sided pain.  Imaging revealed emphysema, old right seventh rib fracture with incomplete healing only.  Apparently, patient's husband reported that she lost about 40 pounds within last 2 months, she has not been eating because she feels like something is stuck in her throat.  Therefore, GI has been consulted for further evaluation.  Patient is having her lunch, tray has soft food and she is slowly chewing and trying to swallow.  Patient reports that she does have choking spells sometimes even to liquids.  She does have postprandial abdominal bloating, discomfort and diarrhea which is intermittent and chronic, usually occurs within 2 hours after meals.  She continues to smoke tobacco.  She reports having had a colonoscopy more than 5 years ago.  Never had an EGD.   Patient is on Prolia for history of osteoporosis  NSAIDs: None  Antiplts/Anticoagulants/Anti thrombotics: None  GI Procedures: Colonoscopy more than 5 years ago  Past Medical History:  Diagnosis Date   Anxiety    Asthma    Breast cancer ([place]) 2004   left breast   Chronic back pain    COPD (chronic obstructive pulmonary disease) ([employer])    Depression    History of kidney surgery    Personal history of radiation therapy    PTSD (post-traumatic stress  disorder)     Past Surgical History:  Procedure Laterality Date   BREAST BIOPSY Left 2004   positive   BREAST BIOPSY Right    neg   BREAST LUMPECTOMY Left 2004   positive   LEFT HEART CATH AND CORONARY ANGIOGRAPHY N/A 11/06/2021   Procedure: LEFT HEART CATH AND CORONARY ANGIOGRAPHY;  Surgeon: Corey Skains, MD;  Location: [facility];  Service: Cardiovascular;  Laterality: N/A;   MASTECTOMY Left    MASTECTOMY     NEPHRECTOMY Left    TUBAL LIGATION       Current Facility-Administered Medications:    acetaminophen (TYLENOL) tablet 650 mg, 650 mg, Oral, Q6H PRN, 650 mg at 06/20/22 0800 **OR** acetaminophen (TYLENOL) suppository 650 mg, 650 mg, Rectal, Q6H PRN, Cox, Amy N, DO   cholecalciferol (VITAMIN D3) 25 MCG (1000 UNIT) tablet 1,000 Units, 1,000 Units, Oral, Daily, Cox, Amy N, DO, 1,000 Units at 06/20/22 0900   cyanocobalamin (VITAMIN B12) tablet 500 mcg, 500 mcg, Oral, Daily, Cox, Amy N, DO, 500 mcg at 06/20/22 0900   enoxaparin (LOVENOX) injection 40 mg, 40 mg, Subcutaneous, QHS, Cox, Amy N, DO, 40 mg at 06/19/22 2019   FLUoxetine (PROZAC) capsule 40 mg, 40 mg, Oral, Daily, Cox, Amy N, DO, 40 mg at 06/20/22 0900   fluticasone (FLONASE) 50 MCG/ACT nasal spray 1 spray, 1 spray, Each Nare, BID, Cox, Amy N, DO   fluticasone furoate-vilanterol (BREO ELLIPTA) 100-25 MCG/ACT 1 puff, 1 puff,  Inhalation, Daily, 1 puff at 06/20/22 0801 **AND** umeclidinium bromide (INCRUSE ELLIPTA) 62.5 MCG/ACT 1 puff, 1 puff, Inhalation, Daily, Cox, Amy N, DO, 1 puff at 06/20/22 0801   hydrALAZINE (APRESOLINE) injection 5 mg, 5 mg, Intravenous, Q8H PRN, Cox, Amy N, DO   hydrOXYzine (ATARAX) tablet 50 mg, 50 mg, Oral, TID PRN, Cox, Amy N, DO, 50 mg at 06/20/22 0108   ketorolac (TORADOL) 15 MG/ML injection 15 mg, 15 mg, Intravenous, Q6H PRN, Danford, Suann Larry, MD, 15 mg at 06/20/22 0341   lidocaine (LIDODERM) 5 % 1 patch, 1 patch, Transdermal, Q24H, Cox, Amy N, DO, 1 patch at 06/20/22 0900    LORazepam (ATIVAN) tablet 0.5 mg, 0.5 mg, Oral, Q6H PRN, Cox, Amy N, DO, 0.5 mg at 06/20/22 0800   melatonin tablet 5 mg, 5 mg, Oral, QHS PRN, Cox, Amy N, DO, 5 mg at 06/19/22 2019   methocarbamol (ROBAXIN) 500 mg in dextrose 5 % 50 mL IVPB, 500 mg, Intravenous, Q8H PRN, Danford, Suann Larry, MD, Last Rate: 110 mL/hr at 06/20/22 1239, 500 mg at 06/20/22 1239   metoprolol tartrate (LOPRESSOR) injection 2.5 mg, 2.5 mg, Intravenous, Q1H PRN, Cox, Amy N, DO   montelukast (SINGULAIR) tablet 10 mg, 10 mg, Oral, QPM, Cox, Amy N, DO, 10 mg at 06/19/22 1734   nicotine (NICODERM CQ - dosed in mg/24 hours) patch 21 mg, 21 mg, Transdermal, Daily PRN, Cox, Amy N, DO   nortriptyline (PAMELOR) capsule 10 mg, 10 mg, Oral, Daily, Danford, Christopher P, MD   ondansetron (ZOFRAN) tablet 4 mg, 4 mg, Oral, Q6H PRN **OR** ondansetron (ZOFRAN) injection 4 mg, 4 mg, Intravenous, Q6H PRN, Cox, Amy N, DO, 4 mg at 06/20/22 0404   senna-docusate (Senokot-S) tablet 1 tablet, 1 tablet, Oral, QHS PRN, Cox, Amy N, DO   Family History  Problem Relation Age of Onset   Breast cancer Cousin    Breast cancer Cousin    Bipolar disorder Daughter    Drug abuse Daughter    Alcohol abuse Maternal Grandfather      Social History   Tobacco Use   Smoking status: Every Day    Packs/day: 0.50    Types: Cigarettes    Passive exposure: Never   Smokeless tobacco: Never  Vaping Use   Vaping Use: Never used  Substance Use Topics   Alcohol use: Never   Drug use: Not Currently    Comment: prescribed muscle relaxers and valium    Allergies as of 06/18/2022 - Review Complete 06/18/2022  Allergen Reaction Noted   Desvenlafaxine Anaphylaxis 05/04/2020   Morphine and related Anaphylaxis 06/09/2018   Penicillins Anaphylaxis 06/09/2018   Prazosin Other (See Comments) 05/04/2020   Tamoxifen Anaphylaxis 06/09/2018   Trazodone Anaphylaxis 05/04/2020   Clonazepam  11/18/2019   Codeine Swelling 03/20/2020   Duloxetine Itching  10/23/2021   Gabapentin Hives 12/10/2021   Hydroxyzine Itching 10/11/2018   Lorazepam  03/21/2021   Paroxetine hcl Hives 05/04/2020   Sulfa antibiotics Itching 06/09/2018   Cephalexin Rash 03/07/2003    Review of Systems:    All systems reviewed and negative except where noted in HPI.   Physical Exam:  Vital signs in last 24 hours: Temp:  [97.5 F (36.4 C)-98.1 F (36.7 C)] 98.1 F (36.7 C) (08/11 0818) Pulse Rate:  [81-93] 93 (08/11 0818) Resp:  [16-20] 16 (08/11 0818) BP: (126-140)/(82-89) 140/87 (08/11 0818) SpO2:  [96 %-98 %] 98 % (08/11 0818) Last BM Date : 06/18/22 (pt unsure last documented 8/9) General:  Pleasant, cooperative in NAD, thin build, poorly nourished Head:  Normocephalic and atraumatic, bitemporal wasting. Eyes:   No icterus.   Conjunctiva pink. PERRLA. Ears:  Normal auditory acuity. Neck:  Supple; no masses or thyroidomegaly Lungs: Respirations even and unlabored. Lungs clear to auscultation bilaterally.   No wheezes, crackles, or rhonchi.  Heart:  Regular rate and rhythm;  Without murmur, clicks, rubs or gallops Abdomen:  Soft, nondistended, nontender. Normal bowel sounds. No appreciable masses or hepatomegaly.  No rebound or guarding.  Rectal:  Not performed. Msk:  Symmetrical without gross deformities.  General weakness Extremities:  Without edema, cyanosis or clubbing. Neurologic:  Alert and oriented x3;  grossly normal neurologically. Skin:  Intact without significant lesions or rashes. Psych:  Alert and cooperative. Normal affect.  LAB RESULTS:    Latest Ref Rng & Units 06/20/2022    8:06 AM 06/19/2022    4:07 AM 06/18/2022    9:47 AM  CBC  WBC 4.0 - 10.5 K/uL 6.6  5.5  6.6   Hemoglobin 12.0 - 15.0 g/dL 13.0  12.4  14.7   Hematocrit 36.0 - 46.0 % 39.4  38.2  45.1   Platelets 150 - 400 K/uL 295  288  360     BMET    Latest Ref Rng & Units 06/20/2022    8:06 AM 06/19/2022    4:07 AM 06/18/2022    9:47 AM  BMP  Glucose 70 - 99 mg/dL 94  83   109   BUN 8 - 23 mg/dL _0 Creatinine 0.44 - 1.00 mg/dL 0.47  0.44  0.50   Sodium 135 - 145 mmol/L 134  134  139   Potassium 3.5 - 5.1 mmol/L 4.4  4.3  3.6   Chloride 98 - 111 mmol/L 102  104  106   CO2 22 - 32 mmol/L _1 Calcium 8.9 - 10.3 mg/dL 8.4  8.5  8.7     LFT    Latest Ref Rng & Units 06/18/2022    9:47 AM 05/01/2022    9:03 AM 04/15/2022    4:56 AM  Hepatic Function  Total Protein 6.5 - 8.1 g/dL 6.9  7.0  6.7   Albumin 3.5 - 5.0 g/dL 4.1  4.2  3.9   AST 15 - 41 U/L _2 ALT 0 - 44 U/L _3 Alk Phosphatase 38 - 126 U/L 39  31  36   Total Bilirubin 0.3 - 1.2 mg/dL 0.8  0.8  0.4      STUDIES: DG Lumbar Spine 2-3 Views  Result Date: 06/19/2022 CLINICAL DATA:  Back pain with no known injury EXAM: LUMBAR SPINE - 2-3 VIEW COMPARISON:  Lumbar MRI 05/01/2022 FINDINGS: Mild levocurvature. No acute fracture or evidence of bone lesion. Diffusely maintained disc height. No notable spurring. Atheromatous calcification of the aorta. IMPRESSION: No acute or focal finding.  No focal or notable degenerative change. Electronically Signed   By: Jorje Guild M.D.   On: 06/19/2022 14:46   CT CHEST WO CONTRAST  Result Date: 06/18/2022 CLINICAL DATA:  Chest wall pain, nontraumatic. Infection or inflammation suspected. History of COPD on home oxygen. History of breast cancer. EXAM: CT CHEST WITHOUT CONTRAST TECHNIQUE: Multidetector CT imaging of the chest was performed following the standard protocol without IV contrast. RADIATION DOSE REDUCTION: This exam was performed according to the departmental dose-optimization program which includes automated  exposure control, adjustment of the mA and/or kV according to patient size and/or use of iterative reconstruction technique. COMPARISON:  Chest CT 05/01/2022 and 02/22/2022. FINDINGS: Cardiovascular: Minimal atherosclerosis of the aorta, great vessels and coronary arteries. No acute vascular findings on noncontrast  imaging. The heart size is normal. There is no pericardial effusion. Mediastinum/Nodes: There are no enlarged mediastinal, hilar or axillary lymph nodes.Hilar assessment is limited by the lack of intravenous contrast, although the hilar contours appear unchanged. The thyroid gland, trachea and esophagus demonstrate no significant findings. Lungs/Pleura: No pleural effusion or pneumothorax. Moderate to severe centrilobular emphysema with mild diffuse central airway thickening. Subpleural scarring anteriorly in the chest attributed to prior radiation therapy. No suspicious pulmonary nodule or confluent airspace opacity. Upper abdomen: No significant findings are seen within the visualized upper abdomen. Musculoskeletal/Chest wall: There is no chest wall mass or suspicious osseous finding. Old fracture of the right 7th rib posteriorly with incomplete healing, stable. Postsurgical changes in the left breast. IMPRESSION: 1. No acute findings or explanation for the patient's symptoms. 2. No evidence of local recurrence of breast cancer or metastatic disease. 3. Stable old fracture of the right 7th rib posteriorly with incomplete healing. 4. Coronary and aortic atherosclerosis (ICD10-I70.0). Emphysema (ICD10-J43.9). Electronically Signed   By: Richardean Sale M.D.   On: 06/18/2022 17:56      Impression / Plan:   Colleen [name] is a 61 y.o. female with history of COPD, home oxygen, chronic tobacco use is seen in consultation for unintentional weight loss, dysphagia, chronic intermittent nonbloody diarrhea  Dysphagia to solids and liquids Recommend EGD for further evaluation of dysphagia No need of barium esophagogram at this time  Chronic intermittent nonbloody diarrhea with unintentional weight loss and abdominal bloating Given patient's history of chronic tobacco use, recommend to check pancreatic fecal elastase levels to evaluate for EPI Recommend EGD and colonoscopy with gastric and duodenal biopsies,  possible TI evaluation and random colon biopsies Clear liquid diet today Bowel prep ordered for today N.p.o. effective 5 AM tomorrow  I have discussed alternative options, risks & benefits,  which include, but are not limited to, bleeding, infection, perforation,respiratory complication & drug reaction.  The patient agrees with this plan & written consent will be obtained.     Thank you for involving me in the care of this patient.      LOS: 0 days   Sherri Sear, MD  06/20/2022, 1:15 PM    Note: This dictation was prepared with Dragon dictation along with smaller phrase technology. Any transcriptional errors that result from this process are unintentional.

## 2022-06-21 ENCOUNTER — Inpatient Hospital Stay: Admitting: Certified Registered Nurse Anesthetist

## 2022-06-21 ENCOUNTER — Encounter: Payer: Self-pay | Admitting: Family Medicine

## 2022-06-21 ENCOUNTER — Encounter
Admission: EM | Disposition: A | Discharge status: Home Health | Payer: Self-pay | Source: Home / Self Care | Attending: Family Medicine

## 2022-06-21 DIAGNOSIS — E440 Moderate protein-calorie malnutrition: Secondary | ICD-10-CM

## 2022-06-21 DIAGNOSIS — R634 Abnormal weight loss: Secondary | ICD-10-CM | POA: Diagnosis not present

## 2022-06-21 DIAGNOSIS — R1310 Dysphagia, unspecified: Secondary | ICD-10-CM | POA: Diagnosis not present

## 2022-06-21 DIAGNOSIS — K529 Noninfective gastroenteritis and colitis, unspecified: Secondary | ICD-10-CM | POA: Diagnosis not present

## 2022-06-21 DIAGNOSIS — K259 Gastric ulcer, unspecified as acute or chronic, without hemorrhage or perforation: Secondary | ICD-10-CM | POA: Diagnosis not present

## 2022-06-21 DIAGNOSIS — R1319 Other dysphagia: Secondary | ICD-10-CM

## 2022-06-21 HISTORY — PX: ESOPHAGOGASTRODUODENOSCOPY (EGD) WITH PROPOFOL: SHX5813

## 2022-06-21 HISTORY — PX: COLONOSCOPY WITH PROPOFOL: SHX5780

## 2022-06-21 LAB — CBC
HCT: 37.2 % (ref 36.0–46.0)
Hemoglobin: 12.6 g/dL (ref 12.0–15.0)
MCH: 29.7 pg (ref 26.0–34.0)
MCHC: 33.9 g/dL (ref 30.0–36.0)
MCV: 87.7 fL (ref 80.0–100.0)
Platelets: 282 10*3/uL (ref 150–400)
RBC: 4.24 MIL/uL (ref 3.87–5.11)
RDW: 12.7 % (ref 11.5–15.5)
WBC: 5.9 10*3/uL (ref 4.0–10.5)
nRBC: 0 % (ref 0.0–0.2)

## 2022-06-21 LAB — COMPREHENSIVE METABOLIC PANEL
ALT: 12 U/L (ref 0–44)
AST: 14 U/L — ABNORMAL LOW (ref 15–41)
Albumin: 3.1 g/dL — ABNORMAL LOW (ref 3.5–5.0)
Alkaline Phosphatase: 29 U/L — ABNORMAL LOW (ref 38–126)
Anion gap: 4 — ABNORMAL LOW (ref 5–15)
BUN: 8 mg/dL (ref 8–23)
CO2: 26 mmol/L (ref 22–32)
Calcium: 8.4 mg/dL — ABNORMAL LOW (ref 8.9–10.3)
Chloride: 102 mmol/L (ref 98–111)
Creatinine, Ser: 0.4 mg/dL — ABNORMAL LOW (ref 0.44–1.00)
GFR, Estimated: >60 mL/min (ref >60)
Glucose, Bld: 84 mg/dL (ref 70–99)
Potassium: 4.4 mmol/L (ref 3.5–5.1)
Sodium: 132 mmol/L — ABNORMAL LOW (ref 135–145)
Total Bilirubin: 0.5 mg/dL (ref 0.3–1.2)
Total Protein: 5.4 g/dL — ABNORMAL LOW (ref 6.5–8.1)

## 2022-06-21 SURGERY — ESOPHAGOGASTRODUODENOSCOPY (EGD) WITH PROPOFOL
Anesthesia: General

## 2022-06-21 MED ORDER — PROPOFOL 500 MG/50ML IV EMUL
INTRAVENOUS | Status: DC | PRN
  Administered 2022-06-21: 150 ug/kg/min via INTRAVENOUS

## 2022-06-21 MED ORDER — METHOCARBAMOL 500 MG PO TABS
750.0000 mg | ORAL_TABLET | Freq: Once | ORAL | Status: AC
  Administered 2022-06-21: 750 mg via ORAL
  Filled 2022-06-21: qty 2

## 2022-06-21 MED ORDER — SODIUM CHLORIDE 0.9 % IV SOLN: INTRAVENOUS | Status: DC

## 2022-06-21 MED ORDER — PANTOPRAZOLE SODIUM 40 MG PO TBEC: 40.0000 mg | DELAYED_RELEASE_TABLET | Freq: Two times a day (BID) | ORAL | Status: DC

## 2022-06-21 MED ORDER — GLYCOPYRROLATE 0.2 MG/ML IJ SOLN
INTRAMUSCULAR | Status: DC | PRN
  Administered 2022-06-21: .2 mg via INTRAVENOUS

## 2022-06-21 MED ORDER — PROPOFOL 1000 MG/100ML IV EMUL
INTRAVENOUS | Status: AC
  Filled 2022-06-21: qty 200

## 2022-06-21 MED ORDER — LIDOCAINE HCL (CARDIAC) PF 100 MG/5ML IV SOSY
PREFILLED_SYRINGE | INTRAVENOUS | Status: DC | PRN
  Administered 2022-06-21: 80 mg via INTRAVENOUS

## 2022-06-21 MED ORDER — PANTOPRAZOLE SODIUM 40 MG PO TBEC: 40.0000 mg | DELAYED_RELEASE_TABLET | Freq: Two times a day (BID) | ORAL | 0 refills | Status: AC

## 2022-06-21 MED ORDER — PROPOFOL 10 MG/ML IV BOLUS
INTRAVENOUS | Status: DC | PRN
  Administered 2022-06-21: 70 mg via INTRAVENOUS
  Administered 2022-06-21: 20 mg via INTRAVENOUS
  Administered 2022-06-21: 10 mg via INTRAVENOUS
  Administered 2022-06-21: 20 mg via INTRAVENOUS
  Administered 2022-06-21: 30 mg via INTRAVENOUS

## 2022-06-21 MED ORDER — METHOCARBAMOL 750 MG PO TABS: 750.0000 mg | ORAL_TABLET | Freq: Four times a day (QID) | ORAL | 0 refills | Status: AC

## 2022-06-21 NOTE — Anesthesia Procedure Notes (Signed)
Date/Time: 06/21/2022 8:34 AM  Performed by: Lily Peer, Inola Lisle, CRNAPre-anesthesia Checklist: Patient identified, Emergency Drugs available, Suction available, Patient being monitored and Timeout performed Patient Re-evaluated:Patient Re-evaluated prior to induction Oxygen Delivery Method: Simple face mask Induction Type: IV induction

## 2022-06-21 NOTE — Op Note (Signed)
[facility] Patient Name: Colleen [name] Procedure Date: 06/21/2022 7:28 AM MRN: [id number] Account #: [id number] Date of Birth: 1961/06/29 Admit Type: Inpatient Age: 61 Room: [facility] ENDO ROOM 4 Gender: Female Note Status: Finalized Instrument Name: Peds Colonoscope [id number] Procedure:             Colonoscopy Indications:           Chronic diarrhea, Clinically significant diarrhea of                         unexplained origin, Weight loss Providers:             Lin Landsman MD, MD Referring MD:          No Local Md, MD (Referring MD) Medicines:             General Anesthesia Complications:         No immediate complications. Estimated blood loss: None. Procedure:             Pre-Anesthesia Assessment:                        - Prior to the procedure, a History and Physical was                         performed, and patient medications and allergies were                         reviewed. The patient is competent. The risks and                         benefits of the procedure and the sedation options and                         risks were discussed with the patient. All questions                         were answered and informed consent was obtained.                         Patient identification and proposed procedure were                         verified by the physician, the nurse, the                         anesthesiologist, the anesthetist and the technician                         in the pre-procedure area in the procedure room in the                         endoscopy suite. Mental Status Examination: alert and                         oriented. Airway Examination: normal oropharyngeal                         airway and neck mobility. Respiratory Examination:  clear to auscultation. CV Examination: normal.                         Prophylactic Antibiotics: The patient does not require                          prophylactic antibiotics. Prior Anticoagulants: The                         patient has taken no previous anticoagulant or                         antiplatelet agents. ASA Grade Assessment: III - A                         patient with severe systemic disease. After reviewing                         the risks and benefits, the patient was deemed in                         satisfactory condition to undergo the procedure. The                         anesthesia plan was to use general anesthesia.                         Immediately prior to administration of medications,                         the patient was re-assessed for adequacy to receive                         sedatives. The heart rate, respiratory rate, oxygen                         saturations, blood pressure, adequacy of pulmonary                         ventilation, and response to care were monitored                         throughout the procedure. The physical status of the                         patient was re-assessed after the procedure.                        After obtaining informed consent, the colonoscope was                         passed under direct vision. Throughout the procedure,                         the patient's blood pressure, pulse, and oxygen                         saturations were monitored continuously. The  Colonoscope was introduced through the anus and                         advanced to the 20 cm into the ileum. The colonoscopy                         was performed with moderate difficulty due to                         inadequate bowel prep. The patient tolerated the                         procedure well. The quality of the bowel preparation                         was poor. Findings:      The perianal and digital rectal examinations were normal. Pertinent       negatives include normal sphincter tone and no palpable rectal lesions.      The terminal ileum appeared  normal.      Normal mucosa was found in the entire colon. Biopsies for histology were       taken with a cold forceps from the entire colon for evaluation of       microscopic colitis.      Non-bleeding external and internal hemorrhoids were found during       retroflexion. The hemorrhoids were large. Impression:            - Preparation of the colon was poor.                        - The examined portion of the ileum was normal.                        - Normal mucosa in the entire examined colon. Biopsied.                        - Non-bleeding external and internal hemorrhoids. Recommendation:        - Return patient to hospital ward for ongoing care.                        - Advance diet as tolerated today.                        - Continue present medications.                        - Await pathology results.                        - Return to GI clinic in 2 months. Procedure Code(s):     --- Professional ---                        [phone], Colonoscopy, flexible; with biopsy, single or                         multiple Diagnosis Code(s):     --- Professional ---  K64.8, Other hemorrhoids                        K52.9, Noninfective gastroenteritis and colitis,                         unspecified                        R19.7, Diarrhea, unspecified                        R63.4, Abnormal weight loss CPT copyright 2019 American Medical Association. All rights reserved. The codes documented in this report are preliminary and upon coder review may  be revised to meet current compliance requirements. Dr. Ulyess Mort Lin Landsman MD, MD 06/21/2022 9:07:18 AM This report has been signed electronically. Number of Addenda: 0 Note Initiated On: 06/21/2022 7:28 AM Scope Withdrawal Time: 0 hours 9 minutes 56 seconds  Total Procedure Duration: 0 hours 13 minutes 14 seconds  Estimated Blood Loss:  Estimated blood loss: none.      [facility]

## 2022-06-21 NOTE — Progress Notes (Signed)
Pt has DC order, pt refused DC but MD was able to talk to pt and agreed to DC. Husband was aware of the plan. AVS was given and explained to pt and husband, all questions has been answered. Husband will drive pt home.

## 2022-06-21 NOTE — Transfer of Care (Signed)
Immediate Anesthesia Transfer of Care Note  Patient: Colleen [name]  Procedure(s) Performed: ESOPHAGOGASTRODUODENOSCOPY (EGD) WITH PROPOFOL COLONOSCOPY WITH PROPOFOL  Patient Location: PACU  Anesthesia Type:General  Level of Consciousness: drowsy  Airway & Oxygen Therapy: Patient Spontanous Breathing and Patient connected to face mask oxygen  Post-op Assessment: Report given to RN and Post -op Vital signs reviewed and stable  Post vital signs: Reviewed and stable  Last Vitals:  Vitals Value Taken Time  BP 120/75   Temp    Pulse 96   Resp 14   SpO2 100     Last Pain:  Vitals:   06/21/22 0423  TempSrc: Oral  PainSc:       Patients Stated Pain Goal: 0 (49/49/44 7395)  Complications: No notable events documented.

## 2022-06-21 NOTE — Discharge Instructions (Addendum)
[facility] Stay  Nutrition Tips: Smaller, more frequent meals (5-6 times per day or more) Avoid carbonated beverages Avoid/Limit beverages with caffeine or alcohol Limit high fiber foods Limit high fat foods including deep fried foods  Do not lie down for at least 45-60 minutes after eating a meal or snack Avoid straws, chewing gum as these can introduce air causing gas/bloating Cut food into bite size pieces and chew well prior to swallowing Follow each bite of food with a sip of liquid Make sure that all food is moist (use sauces, gravies, butter, etc to moisten food as needed) Do NOT eat tough, fibrous meats  It is recommended that you to drink 2 bottles per day of:     ENSURE PLUS for at least 1 month (30 days) after your hospital stay Please continue for longer period of time if intake of meals and snacks is not adequate.    Tips for adding a nutrition shake into your routine: As allowed, drink one with vitamins or medications instead of water or juice Enjoy one as a tasty mid-morning or afternoon snack Drink cold or make a milkshake out of it Drink one instead of milk with cereal or snacks Use as a coffee creamer   Available at the following grocery stores and pharmacies:           * [facility] [place] [phone]            For COUPONS visit: www.ensure.com/join or [email or web address]   Suggested Substitutions Ensure Plus = Boost Plus = Medical illustrator = Boost Compact   [place] MS, RDN, LDN, CNSC Registered Dietitian 3 Clinical Nutrition

## 2022-06-21 NOTE — Progress Notes (Signed)
Nutrition Brief Follow-up:  Dietitian   Pt discharging today. Pt reports poor po intake with wt loss of 40 pounds; reports food gets stuck in throat and diarrhea. EGD and colonoscopy today unrevealing. Discussed pt with Dr. Loleta Books and discussed recommendations.   Recommend Ensure Plus or supplement equivalent BID post discharge. MD to place in discharge meds.   Nutrition recommendations regarding swallowing, diarrhea and decreased oral intake placed in discharge instructions. Recommend small, frequent meals at discharge.   Kerman Passey MS, RDN, LDN, CNSC Registered Dietitian 3 Clinical Nutrition RD Pager and On-Call Pager Number Located in [place]

## 2022-06-21 NOTE — Anesthesia Preprocedure Evaluation (Signed)
Anesthesia Evaluation  Patient identified by MRN, date of birth, ID band Patient awake    Reviewed: Allergy & Precautions, NPO status , Patient's Chart, lab work & pertinent test results  Airway Mallampati: II  TM Distance: >3 FB Neck ROM: full    Dental  (+) Missing, Poor Dentition   Pulmonary COPD,  oxygen dependent, Current Smoker and Patient abstained from smoking.,    Pulmonary exam normal        Cardiovascular (-) angina+ CAD and +CHF  (-) Past MI Normal cardiovascular exam(-) pacemaker     Neuro/Psych PSYCHIATRIC DISORDERS negative neurological ROS     GI/Hepatic negative GI ROS, Neg liver ROS,   Endo/Other  negative endocrine ROS  Renal/GU negative Renal ROS  negative genitourinary   Musculoskeletal   Abdominal   Peds  Hematology negative hematology ROS (+)   Anesthesia Other Findings Past Medical History: No date: Anxiety No date: Asthma 2004: Breast cancer ([place])     Comment:  left breast No date: Chronic back pain No date: COPD (chronic obstructive pulmonary disease) (HCC) No date: Depression No date: History of kidney surgery No date: Personal history of radiation therapy No date: PTSD (post-traumatic stress disorder)  Past Surgical History: 2004: BREAST BIOPSY; Left     Comment:  positive No date: BREAST BIOPSY; Right     Comment:  neg 2004: BREAST LUMPECTOMY; Left     Comment:  positive 11/06/2021: LEFT HEART CATH AND CORONARY ANGIOGRAPHY; N/A     Comment:  Procedure: LEFT HEART CATH AND CORONARY ANGIOGRAPHY;                Surgeon: Corey Skains, MD;  Location: [place]               CV LAB;  Service: Cardiovascular;  Laterality: N/A; No date: MASTECTOMY; Left No date: MASTECTOMY No date: NEPHRECTOMY; Left No date: TUBAL LIGATION  BMI    Body Mass Index: 21.53 kg/m      Reproductive/Obstetrics negative OB ROS                              Anesthesia Physical Anesthesia Plan  ASA: 3  Anesthesia Plan: General   Post-op Pain Management:    Induction: Intravenous  PONV Risk Score and Plan: Propofol infusion and TIVA  Airway Management Planned: Natural Airway and Nasal Cannula  Additional Equipment:   Intra-op Plan:   Post-operative Plan:   Informed Consent: I have reviewed the patients History and Physical, chart, labs and discussed the procedure including the risks, benefits and alternatives for the proposed anesthesia with the patient or authorized representative who has indicated his/her understanding and acceptance.     Dental Advisory Given  Plan Discussed with: Anesthesiologist, CRNA and Surgeon  Anesthesia Plan Comments: (Patient consented for risks of anesthesia including but not limited to:  - adverse reactions to medications - risk of airway placement if required - damage to eyes, teeth, lips or other oral mucosa - nerve damage due to positioning  - sore throat or hoarseness - Damage to heart, brain, nerves, lungs, other parts of body or loss of life  Patient voiced understanding.)        Anesthesia Quick Evaluation

## 2022-06-21 NOTE — Op Note (Signed)
[facility] Gastroenterology Patient Name: Colleen [name] Procedure Date: 06/21/2022 7:28 AM MRN: [id number] Account #: [id number] Date of Birth: 1960-11-24 Admit Type: Inpatient Age: 61 Room: [facility] ENDO ROOM 4 Gender: Female Note Status: Finalized Instrument Name: Upper Endoscope [id number] Procedure:             Upper GI endoscopy Indications:           Dysphagia, Diarrhea, Weight loss Providers:             Lin Landsman MD, MD Referring MD:          No Local Md, MD (Referring MD) Medicines:             General Anesthesia Complications:         No immediate complications. Estimated blood loss: None. Procedure:             Pre-Anesthesia Assessment:                        - Prior to the procedure, a History and Physical was                         performed, and patient medications and allergies were                         reviewed. The patient is competent. The risks and                         benefits of the procedure and the sedation options and                         risks were discussed with the patient. All questions                         were answered and informed consent was obtained.                         Patient identification and proposed procedure were                         verified by the physician, the nurse, the                         anesthesiologist, the anesthetist and the technician                         in the pre-procedure area in the procedure room in the                         endoscopy suite. Mental Status Examination: alert and                         oriented. Airway Examination: normal oropharyngeal                         airway and neck mobility. Respiratory Examination:                         clear to auscultation. CV Examination: normal.  Prophylactic Antibiotics: The patient does not require                         prophylactic antibiotics. Prior Anticoagulants: The                          patient has taken no previous anticoagulant or                         antiplatelet agents. ASA Grade Assessment: III - A                         patient with severe systemic disease. After reviewing                         the risks and benefits, the patient was deemed in                         satisfactory condition to undergo the procedure. The                         anesthesia plan was to use general anesthesia.                         Immediately prior to administration of medications,                         the patient was re-assessed for adequacy to receive                         sedatives. The heart rate, respiratory rate, oxygen                         saturations, blood pressure, adequacy of pulmonary                         ventilation, and response to care were monitored                         throughout the procedure. The physical status of the                         patient was re-assessed after the procedure.                        After obtaining informed consent, the endoscope was                         passed under direct vision. Throughout the procedure,                         the patient's blood pressure, pulse, and oxygen                         saturations were monitored continuously. The Endoscope                         was introduced through the mouth, and advanced to the  second part of duodenum. The upper GI endoscopy was                         accomplished without difficulty. The patient tolerated                         the procedure well. Findings:      The duodenal bulb and second portion of the duodenum were normal.       Biopsies were taken with a cold forceps for histology.      Diffuse moderate inflammation characterized by erosions, erythema and       linear erosions was found in the entire examined stomach. Biopsies were       taken with a cold forceps for histology.      The cardia and gastric fundus were normal on  retroflexion.      Esophagogastric landmarks were identified: the gastroesophageal junction       was found at 38 cm from the incisors.      The gastroesophageal junction and examined esophagus were normal.       Biopsies were taken with a cold forceps for histology. Impression:            - Normal duodenal bulb and second portion of the                         duodenum. Biopsied.                        - Erosive gastritis. Biopsied.                        - Esophagogastric landmarks identified.                        - Normal gastroesophageal junction and esophagus.                         Biopsied. Recommendation:        - Await pathology results.                        - Use a proton pump inhibitor PO BID for 3 months.                        - Proceed with colonoscopy as scheduled                        See colonoscopy report Procedure Code(s):     --- Professional ---                        [phone], Esophagogastroduodenoscopy, flexible,                         transoral; with biopsy, single or multiple Diagnosis Code(s):     --- Professional ---                        K29.60, Other gastritis without bleeding                        R13.10, Dysphagia, unspecified  R19.7, Diarrhea, unspecified                        R63.4, Abnormal weight loss CPT copyright 2019 American Medical Association. All rights reserved. The codes documented in this report are preliminary and upon coder review may  be revised to meet current compliance requirements. Dr. Ulyess Mort Lin Landsman MD, MD 06/21/2022 8:45:18 AM This report has been signed electronically. Number of Addenda: 0 Note Initiated On: 06/21/2022 7:28 AM Estimated Blood Loss:  Estimated blood loss: none.      [facility]

## 2022-06-21 NOTE — TOC Transition Note (Addendum)
Transition of Care [facility]) - CM/SW Discharge Note   Patient Details  Name: Colleen [name] MRN: [id number] Date of Birth: 1961-05-29  Transition of Care [facility]) CM/SW Contact:  Izola Price, RN Phone Number: 06/21/2022, 11:10 AM   Clinical Narrative:  8/12: Discharging today after procedure. Patient had declined [facility] services, provider aware.  Previously set up with [facility] on DC from [facility] but services were also declined when patient discharged to home per RN CM TOC assessment note. Simmie Davies RN CM   DME RW and 3:1 ordered via [employer] for delivery to home per patient request.   1 pm: Spouse declined 3:1. [employer] notified. Spouse number for contact given to [employer] for delivery time. Simmie Davies RN CM   Final next level of care: [place] (Patient declined [facility] services per RN CM note. Provider aware.) Barriers to Discharge: Barriers Resolved   Patient Goals and CMS Choice        Discharge Placement                       Discharge Plan and Services                DME Arranged: N/A DME Agency: NA       HH Arranged: NA HH Agency: NA        Social Determinants of Health (SDOH) Interventions     Readmission Risk Interventions    03/10/2022    9:51 AM 02/14/2022    1:57 PM 12/06/2021   10:23 AM  Readmission Risk Prevention Plan  Transportation Screening Complete Complete Complete  PCP or Specialist Appt within 5-7 Days   Complete  Medication Review (RN CM)   Complete  Medication Review Press photographer) Complete Complete   PCP or Specialist appointment within 3-5 days of discharge Complete Complete   SW Recovery Care/Counseling Consult Complete    Palliative Care Screening Not Applicable Not [place] Not Applicable Not Applicable

## 2022-06-21 NOTE — Anesthesia Postprocedure Evaluation (Signed)
Anesthesia Post Note  Patient: Teacher, early years/pre  Procedure(s) Performed: ESOPHAGOGASTRODUODENOSCOPY (EGD) WITH PROPOFOL COLONOSCOPY WITH PROPOFOL  Patient location during evaluation: Endoscopy Anesthesia Type: General Level of consciousness: awake and alert Pain management: pain level controlled Vital Signs Assessment: post-procedure vital signs reviewed and stable Respiratory status: spontaneous breathing, nonlabored ventilation, respiratory function stable and patient connected to nasal cannula oxygen Cardiovascular status: blood pressure returned to baseline and stable Postop Assessment: no apparent nausea or vomiting Anesthetic complications: no   No notable events documented.   Last Vitals:  Vitals:   06/21/22 0930 06/21/22 0955  BP: (!) 145/95 (!) 144/93  Pulse: 84 82  Resp: 20 20  Temp: 36.6 C 36.7 C  SpO2: 98% 96%    Last Pain:  Vitals:   06/21/22 1035  TempSrc:   PainSc: [place]

## 2022-06-21 NOTE — Discharge Summary (Signed)
Physician Discharge Summary   Patient: Colleen [name] MRN: [id number] DOB: 07/04/1961  Admit date:     06/18/2022  Discharge date: 06/21/22  Discharge Physician: Edwin Dada   PCP: Remi Haggard, FNP     Recommendations at discharge:  Follow up with GI Dr. Marius Ditch for follow up biopsies and follow up diarrhea and weight loss Follow up with PCP Threasa Alpha Ms Lavena Bullion: Patient appears to have lost her therapeutic alliance with her current behavioral health provider; please assist patient with new referral to alternative behavioral health provider     Discharge Diagnoses: Principal Problem:   Weight loss Active Problems:   Chronic pain syndrome   Right-sided chest pain   Right buttock pain   COPD (chronic obstructive pulmonary disease) (HCC)   GAD (generalized anxiety disorder)   Tobacco use disorder   Hyponatremia   Coronary artery disease   Depression with anxiety   Tremor   Chronic diarrhea of unknown origin   Loss of weight   Gastric erosion   Protein-calorie malnutrition, moderate [facility])      Hospital Course: Colleen [name] is a 61 year old female with history of tobacco use disorder, severe COPD, chronic O2 supplementation nightly chronic pain syndrome, generalized anxiety disorder, depression, history of breast cancer status post SBRT, who presents to the emergency department for chief concerns of right-sided chest and back pain.  While in the hospital, her chest pain resolved spontaneously.  Her back pain was severely limiting her ability to participate with PT.   In addition, after 2 days in the hospital, the patient's husband reported an additional concern of weight loss and anorexia, which the patient claimed was feeling of food not being able to go down her throat.       * Dysphagia, ruled out Patient's husband reported poor oral intake and 40 lbs weight loss in last few months (chart review confirmed 20 lbs weight loss in six months) and  that patient was not eating at home.  On questioning the patient, she reported inability to eat and feeling that food got "stuck in her throat".  GI were consulted, and she underwent endoscopy that showed a normal esophagus without stricture, mild gastritis for which she was started on PPI.    Observation in the hospital showed that patient was able to eat 100% of meals, and husband confirmed, patient seems to eat well in the hospital, but does not eat at all at home.  Nutritionist consulted, who provided dietary advice and recommended Ensure twice daily.      Moderate protein calorie malnutrition As evidenced by 10kg weight loss (>5% of body weight) in last 5 months, as well as reduced diffuse subcutaneous muscle mass and fat.  Follow up with GI.    Chronic pain syndrome Right-sided chest pain Troponin and EKG normal.  D-dimer negative.  CT chest shows emphysema, no evidence of mass, rib fracture, pneumothorax, pneumonia, or other explanation for right-sided chest pain.  Right lower back pain Patient recently had an MRI of the lumbar spine that showed no spondylolisthesis, no stenosis, no herniated disc, or other causes of radicular pain.  She had a repeat x-ray here that was also normal and unremarkable.  On my exam she had negative straight leg raise test, and her pain is primarily in the lumbar paraspinal muscles.  I am somewhat uncertain if this is part of her myofascial syndrome, or if this is a simple paraspinal lumbago, but in either case, she is on appropriate treatments  Discharged on Robaxin, recommended [facility] PT which patient declined.  Recommend follow up with Pain Medicine specialist, with whom she is already established.       Depression with anxiety - Recommend follow up     COPD (chronic obstructive pulmonary disease) (HCC) Respiratory failure ruled out.  Patient felt her breathing and cough symptoms were at baseline             The [place] was reviewed for this patient prior to discharge.   Consultants: GI Procedures performed:  - CT chest - X-ray lumbar spine - EGD - Colonoscopy   Disposition: Home   DISCHARGE MEDICATION: Allergies as of 06/21/2022       Reactions   Desvenlafaxine Anaphylaxis   Morphine And Related Anaphylaxis   Penicillins Anaphylaxis   Has patient had a PCN reaction causing immediate rash, facial/tongue/throat swelling, SOB or lightheadedness with hypotension: Yes Has patient had a PCN reaction causing severe rash involving mucus membranes or skin necrosis: No Has patient had a PCN reaction that required hospitalization: Unknown Has patient had a PCN reaction occurring within the last 10 years: No If all of the above answers are "NO", then may proceed with Cephalosporin use.   Prazosin Other (See Comments)   Tamoxifen Anaphylaxis   Trazodone Anaphylaxis   Clonazepam    Codeine Swelling   Duloxetine Itching   Gabapentin Hives   Rash and swelling.    Hydroxyzine Itching   Lorazepam    Patient feels this medications makes her to sedated and wants to avoid   Paroxetine Hcl Hives   Sulfa Antibiotics Itching   Cephalexin Rash        Medication List     STOP taking these medications    azithromycin 250 MG tablet Commonly known as: ZITHROMAX   Incruse Ellipta 62.5 MCG/ACT Aepb Generic drug: umeclidinium bromide       TAKE these medications    albuterol 108 (90 Base) MCG/ACT inhaler Commonly known as: VENTOLIN HFA Inhale 1-2 puffs into the lungs every 4 (four) hours as needed for cough or wheezing.   cholecalciferol 25 MCG (1000 UNIT) tablet Commonly known as: VITAMIN D3 Take 1,000 Units by mouth daily.   cyanocobalamin 500 MCG tablet Commonly known as: VITAMIN B12 Take 500 mcg by mouth daily.   denosumab 60 MG/ML Sosy injection Commonly known as: PROLIA Inject 60 mg into the skin every 6 (six) months.   EPINEPHrine 0.3 mg/0.3 mL Soaj  injection Commonly known as: EPI-PEN Inject 0.3 mg into the muscle as needed for anaphylaxis.   FLUoxetine 40 MG capsule Commonly known as: PROZAC Take 40 mg by mouth daily. What changed: Another medication with the same name was removed. Continue taking this medication, and follow the directions you see here.   fluticasone 50 MCG/ACT nasal spray Commonly known as: FLONASE Place 1 spray into both nostrils 2 (two) times daily.   hydrOXYzine 50 MG tablet Commonly known as: ATARAX Take 50 mg by mouth 3 (three) times daily as needed.   ipratropium 0.02 % nebulizer solution Commonly known as: ATROVENT Take 0.5 mg by nebulization 4 (four) times daily.   lidocaine 5 % Commonly known as: LIDODERM Place 1 patch onto the skin daily. Apply to lower back. Remove & Discard patch within 12 hours or as directed by MD   methocarbamol 750 MG tablet Commonly known as: ROBAXIN Take 1 tablet (750 mg total) by mouth 4 (four) times daily.   montelukast 10 MG tablet  Commonly known as: SINGULAIR Take 10 mg by mouth daily.   pantoprazole 40 MG tablet Commonly known as: PROTONIX Take 1 tablet (40 mg total) by mouth 2 (two) times daily before a meal.   predniSONE 10 MG tablet Commonly known as: DELTASONE Take 1 tablet (10 mg total) by mouth daily.   Trelegy Ellipta 100-62.5-25 MCG/ACT Aepb Generic drug: Fluticasone-Umeclidin-Vilant Inhale 1 puff into the lungs daily.               Durable Medical Equipment  (From admission, onward)           Start     Ordered   06/21/22 1241  For home use only DME Walker rolling  Once       Question Answer Comment  Walker: With [place]   Patient needs a walker to treat with the following condition Chronic pain      06/21/22 1240   06/21/22 1240  For home use only DME 3 n 1  Once        06/21/22 1240            Follow-up Information     Remi Haggard, FNP. Schedule an appointment as soon as possible for a visit in 1 week(s).    Specialty: Family Medicine Contact information: [place] [address] [phone]         Gillis Santa, MD. Schedule an appointment as soon as possible for a visit in 1 week(s).   Specialty: Pain Medicine Contact information: [place] [address] [phone]                 Discharge Instructions     Discharge instructions   Complete by: As directed    From Dr. Loleta Books: You were admitted for right sided chest pain, right lower back pain and weight loss. You had lab testing that ruled out heart attack or blood clot.  You had imaging of the chest that ruled out cancer, blood vessel disease, pneumonia, collapsed lung, or other masses or fractures.  There was nothing in your testing to explain the chest pain.  Likewise, our testing here including x-ray to follow up your recent normal MRI, and lab work revealed no emergent or serious cause of your lower back pain.   You should take Robaxin for this and follow up with Dr. Holley Raring immediately.  For the weight loss, Dr. Marius Ditch did a upper endoscopy and colonoscopy.  This showed no concerning findings.  She did see that you have some gastritis (which is common, just means irritation of the stomach), and so she recommended an antacid Pantoprazole twice daily for a month.  For the weight loss, our dietitian recommends you take Ensure which is a high calorie high protein nutritional supplement, twice daily   Resume your other home medicines Go see your primary doctor as soon as possible.   Increase activity slowly   Complete by: As directed        Discharge Exam: Filed Weights   06/18/22 0942 06/18/22 1702 06/21/22 1010  Weight: 50 kg 50 kg 47.7 kg    General: Pt is alert, awake, not in acute distress Cardiovascular: RRR, nl S1-S2, no murmurs appreciated.   No LE edema.   Respiratory: Normal respiratory rate and rhythm.  CTAB without rales or wheezes. Abdominal: Abdomen soft  and non-tender.  No distension or HSM.   Neuro/Psych: Strength symmetric in upper and lower extremities.  Judgment and insight appear normal.  Condition at discharge: fair  The results of significant diagnostics from this hospitalization (including imaging, microbiology, ancillary and laboratory) are listed below for reference.   Imaging Studies: DG Lumbar Spine 2-3 Views  Result Date: 06/19/2022 CLINICAL DATA:  Back pain with no known injury EXAM: LUMBAR SPINE - 2-3 VIEW COMPARISON:  Lumbar MRI 05/01/2022 FINDINGS: Mild levocurvature. No acute fracture or evidence of bone lesion. Diffusely maintained disc height. No notable spurring. Atheromatous calcification of the aorta. IMPRESSION: No acute or focal finding.  No focal or notable degenerative change. Electronically Signed   By: Jorje Guild M.D.   On: 06/19/2022 14:46   CT CHEST WO CONTRAST  Result Date: 06/18/2022 CLINICAL DATA:  Chest wall pain, nontraumatic. Infection or inflammation suspected. History of COPD on home oxygen. History of breast cancer. EXAM: CT CHEST WITHOUT CONTRAST TECHNIQUE: Multidetector CT imaging of the chest was performed following the standard protocol without IV contrast. RADIATION DOSE REDUCTION: This exam was performed according to the departmental dose-optimization program which includes automated exposure control, adjustment of the mA and/or kV according to patient size and/or use of iterative reconstruction technique. COMPARISON:  Chest CT 05/01/2022 and 02/22/2022. FINDINGS: Cardiovascular: Minimal atherosclerosis of the aorta, great vessels and coronary arteries. No acute vascular findings on noncontrast imaging. The heart size is normal. There is no pericardial effusion. Mediastinum/Nodes: There are no enlarged mediastinal, hilar or axillary lymph nodes.Hilar assessment is limited by the lack of intravenous contrast, although the hilar contours appear unchanged. The thyroid gland, trachea and esophagus  demonstrate no significant findings. Lungs/Pleura: No pleural effusion or pneumothorax. Moderate to severe centrilobular emphysema with mild diffuse central airway thickening. Subpleural scarring anteriorly in the chest attributed to prior radiation therapy. No suspicious pulmonary nodule or confluent airspace opacity. Upper abdomen: No significant findings are seen within the visualized upper abdomen. Musculoskeletal/Chest wall: There is no chest wall mass or suspicious osseous finding. Old fracture of the right 7th rib posteriorly with incomplete healing, stable. Postsurgical changes in the left breast. IMPRESSION: 1. No acute findings or explanation for the patient's symptoms. 2. No evidence of local recurrence of breast cancer or metastatic disease. 3. Stable old fracture of the right 7th rib posteriorly with incomplete healing. 4. Coronary and aortic atherosclerosis (ICD10-I70.0). Emphysema (ICD10-J43.9). Electronically Signed   By: Richardean Sale M.D.   On: 06/18/2022 17:56   DG Chest Portable 1 View  Result Date: 06/18/2022 CLINICAL DATA:  Diffuse right-sided pain. EXAM: PORTABLE CHEST 1 VIEW COMPARISON:  Chest x-ray dated May 01, 2022. FINDINGS: The heart size and mediastinal contours are within normal limits. Both lungs are clear. The visualized skeletal structures are unremarkable. IMPRESSION: No active disease. Electronically Signed   By: Titus Dubin M.D.   On: 06/18/2022 10:13    Microbiology: Results for orders placed or performed during the hospital encounter of 06/18/22  SARS Coronavirus 2 by RT PCR (hospital order, performed in [facility] hospital lab) *cepheid single result test* Anterior Nasal Swab     Status: None   Collection Time: 06/18/22 10:02 AM   Specimen: Anterior Nasal Swab  Result Value Ref Range Status   SARS Coronavirus 2 by RT PCR NEGATIVE NEGATIVE Final    Comment: (NOTE) SARS-CoV-2 target nucleic acids are NOT DETECTED.  The SARS-CoV-2 RNA is generally  detectable in upper and lower respiratory specimens during the acute phase of infection. The lowest concentration of SARS-CoV-2 viral copies this assay can detect is 250 copies / mL. A negative result does not preclude SARS-CoV-2  infection and should not be used as the sole basis for treatment or other patient management decisions.  A negative result may occur with improper specimen collection / handling, submission of specimen other than nasopharyngeal swab, presence of viral mutation(s) within the areas targeted by this assay, and inadequate number of viral copies (<250 copies / mL). A negative result must be combined with clinical observations, patient history, and epidemiological information.  Fact Sheet for Patients:   https://www.patel.info/  Fact Sheet for Healthcare Providers: https://hall.com/  This test is not yet approved or  cleared by the [place] FDA and has been authorized for detection and/or diagnosis of SARS-CoV-2 by FDA under an Emergency Use Authorization (EUA).  This EUA will remain in effect (meaning this test can be used) for the duration of the COVID-19 declaration under Section 564(b)(1) of the Act, 21 U.S.C. section 360bbb-3(b)(1), unless the authorization is terminated or revoked sooner.  Performed at [facility], [address] [place], [place] [address]     Labs: CBC: Recent Labs  Lab 06/18/22 [phone] 06/19/22 0407 06/20/22 0806 06/21/22 0606  WBC 6.6 5.5 6.6 5.9  NEUTROABS 3.9  --   --   --   HGB 14.7 12.4 13.0 12.6  HCT 45.1 38.2 39.4 37.2  MCV 90.0 91.0 90.8 87.7  PLT 360 288 295 048   Basic Metabolic Panel: Recent Labs  Lab 06/18/22 0947 06/19/22 0407 06/20/22 0806 06/21/22 0606  NA 139 134* 134* 132*  K 3.6 4.3 4.4 4.4  CL 106 104 102 102  CO2 '25 25 26 26  '$ GLUCOSE 109* 83 94 84  BUN '14 16 12 8  '$ CREATININE 0.50 0.44 0.47 0.40*  CALCIUM 8.7* 8.5* 8.4* 8.4*   Liver  Function Tests: Recent Labs  Lab 06/18/22 0947 06/21/22 0606  AST 19 14*  ALT 13 12  ALKPHOS 39 29*  BILITOT 0.8 0.5  PROT 6.9 5.4*  ALBUMIN 4.1 3.1*   CBG: No results for input(s): "GLUCAP" in the last 168 hours.  Discharge time spent: approximately 35 minutes spent on discharge counseling, evaluation of patient on day of discharge, and coordination of discharge planning with nursing, social work, pharmacy and case management  Signed: Edwin Dada, MD [facility] 06/21/2022

## 2022-06-21 NOTE — Progress Notes (Addendum)
DME 3:1 Need/Order:   Patient is not able to walk the distance required to go the bathroom, or he/she is unable to safely negotiate stairs required to access the bathroom.  A 3in1 BSC will alleviate this problem. Simmie Davies RN CM

## 2022-06-26 LAB — SURGICAL PATHOLOGY

## 2022-07-03 ENCOUNTER — Telehealth: Payer: Self-pay

## 2022-07-03 DIAGNOSIS — R634 Abnormal weight loss: Secondary | ICD-10-CM

## 2022-07-03 DIAGNOSIS — K529 Noninfective gastroenteritis and colitis, unspecified: Secondary | ICD-10-CM

## 2022-07-03 NOTE — Telephone Encounter (Signed)
Order the labs and made follow up appointment for 07/29/2022 at 2:15. Called patient and left a message for call back

## 2022-07-03 NOTE — Telephone Encounter (Signed)
-----   Message from Lin Landsman, MD sent at 07/03/2022 12:15 AM EDT ----- Please inform patient that the pathology results from upper endoscopy and colonoscopy came back unremarkable.  Please make a hospital follow-up appointment to see me, next available Recommend to check pancreatic fecal elastase levels for chronic diarrhea and weight loss  RV

## 2022-07-15 ENCOUNTER — Emergency Department

## 2022-07-15 ENCOUNTER — Observation Stay
Admission: EM | Admit: 2022-07-15 | Discharge: 2022-07-17 | Disposition: A | Discharge status: Home / Self Care | Attending: Internal Medicine | Admitting: Internal Medicine

## 2022-07-15 ENCOUNTER — Other Ambulatory Visit: Payer: Self-pay

## 2022-07-15 ENCOUNTER — Encounter: Payer: Self-pay | Admitting: Emergency Medicine

## 2022-07-15 DIAGNOSIS — R079 Chest pain, unspecified: Secondary | ICD-10-CM | POA: Diagnosis present

## 2022-07-15 DIAGNOSIS — F418 Other specified anxiety disorders: Secondary | ICD-10-CM | POA: Diagnosis present

## 2022-07-15 DIAGNOSIS — G894 Chronic pain syndrome: Secondary | ICD-10-CM | POA: Insufficient documentation

## 2022-07-15 DIAGNOSIS — J449 Chronic obstructive pulmonary disease, unspecified: Secondary | ICD-10-CM | POA: Diagnosis not present

## 2022-07-15 DIAGNOSIS — J45909 Unspecified asthma, uncomplicated: Secondary | ICD-10-CM | POA: Diagnosis not present

## 2022-07-15 DIAGNOSIS — F411 Generalized anxiety disorder: Secondary | ICD-10-CM | POA: Diagnosis present

## 2022-07-15 DIAGNOSIS — F17210 Nicotine dependence, cigarettes, uncomplicated: Secondary | ICD-10-CM | POA: Diagnosis not present

## 2022-07-15 DIAGNOSIS — F419 Anxiety disorder, unspecified: Secondary | ICD-10-CM | POA: Diagnosis present

## 2022-07-15 DIAGNOSIS — R072 Precordial pain: Secondary | ICD-10-CM

## 2022-07-15 DIAGNOSIS — Z853 Personal history of malignant neoplasm of breast: Secondary | ICD-10-CM | POA: Diagnosis not present

## 2022-07-15 DIAGNOSIS — Z79899 Other long term (current) drug therapy: Secondary | ICD-10-CM | POA: Diagnosis not present

## 2022-07-15 DIAGNOSIS — R0789 Other chest pain: Principal | ICD-10-CM | POA: Insufficient documentation

## 2022-07-15 DIAGNOSIS — Z20822 Contact with and (suspected) exposure to covid-19: Secondary | ICD-10-CM | POA: Diagnosis not present

## 2022-07-15 DIAGNOSIS — J9610 Chronic respiratory failure, unspecified whether with hypoxia or hypercapnia: Secondary | ICD-10-CM | POA: Insufficient documentation

## 2022-07-15 LAB — CBC
HCT: 44.4 % (ref 36.0–46.0)
Hemoglobin: 14.9 g/dL (ref 12.0–15.0)
MCH: 29.6 pg (ref 26.0–34.0)
MCHC: 33.6 g/dL (ref 30.0–36.0)
MCV: 88.1 fL (ref 80.0–100.0)
Platelets: 412 10*3/uL — ABNORMAL HIGH (ref 150–400)
RBC: 5.04 MIL/uL (ref 3.87–5.11)
RDW: 15.6 % — ABNORMAL HIGH (ref 11.5–15.5)
WBC: 7.8 10*3/uL (ref 4.0–10.5)
nRBC: 0 % (ref 0.0–0.2)

## 2022-07-15 LAB — COMPREHENSIVE METABOLIC PANEL
ALT: 13 U/L (ref 0–44)
AST: 17 U/L (ref 15–41)
Albumin: 3.8 g/dL (ref 3.5–5.0)
Alkaline Phosphatase: 43 U/L (ref 38–126)
Anion gap: 11 (ref 5–15)
BUN: 5 mg/dL — ABNORMAL LOW (ref 8–23)
CO2: 23 mmol/L (ref 22–32)
Calcium: 8.8 mg/dL — ABNORMAL LOW (ref 8.9–10.3)
Chloride: 94 mmol/L — ABNORMAL LOW (ref 98–111)
Creatinine, Ser: 0.37 mg/dL — ABNORMAL LOW (ref 0.44–1.00)
GFR, Estimated: >60 mL/min (ref >60)
Glucose, Bld: 90 mg/dL (ref 70–99)
Potassium: 3.9 mmol/L (ref 3.5–5.1)
Sodium: 128 mmol/L — ABNORMAL LOW (ref 135–145)
Total Bilirubin: 0.8 mg/dL (ref 0.3–1.2)
Total Protein: 6.9 g/dL (ref 6.5–8.1)

## 2022-07-15 LAB — TROPONIN I (HIGH SENSITIVITY)
Troponin I (High Sensitivity): 29 ng/L — ABNORMAL HIGH (ref <18)
Troponin I (High Sensitivity): 31 ng/L — ABNORMAL HIGH (ref <18)

## 2022-07-15 LAB — RESP PANEL BY RT-PCR (FLU A&B, COVID) ARPGX2
Influenza A by PCR: NEGATIVE
Influenza B by PCR: NEGATIVE
SARS Coronavirus 2 by RT PCR: NEGATIVE

## 2022-07-15 MED ORDER — ASPIRIN 81 MG PO CHEW
324.0000 mg | CHEWABLE_TABLET | Freq: Once | ORAL | Status: AC
  Administered 2022-07-15: 324 mg via ORAL
  Filled 2022-07-15: qty 4

## 2022-07-15 MED ORDER — IPRATROPIUM-ALBUTEROL 0.5-2.5 (3) MG/3ML IN SOLN
3.0000 mL | Freq: Once | RESPIRATORY_TRACT | Status: AC
  Administered 2022-07-15: 3 mL via RESPIRATORY_TRACT
  Filled 2022-07-15: qty 3

## 2022-07-15 MED ORDER — FENTANYL CITRATE PF 50 MCG/ML IJ SOSY
25.0000 ug | PREFILLED_SYRINGE | Freq: Once | INTRAMUSCULAR | Status: AC
  Administered 2022-07-15: 25 ug via INTRAVENOUS
  Filled 2022-07-15: qty 1

## 2022-07-15 MED ORDER — FENTANYL CITRATE PF 50 MCG/ML IJ SOSY
50.0000 ug | PREFILLED_SYRINGE | Freq: Once | INTRAMUSCULAR | Status: AC
  Administered 2022-07-15: 50 ug via INTRAVENOUS
  Filled 2022-07-15: qty 1

## 2022-07-15 MED ORDER — IOHEXOL 350 MG/ML SOLN
75.0000 mL | Freq: Once | INTRAVENOUS | Status: AC | PRN
  Administered 2022-07-15: 75 mL via INTRAVENOUS

## 2022-07-15 NOTE — H&P (Signed)
History and Physical    Colleen [name] HQI:[id number] DOB: [birth year] DOA: 07/15/2022  PCP: Remi Haggard, FNP    Patient coming from:  Home    Chief Complaint:  Chest pain.   HPI:  Colleen [name] is a 61 y.o. female seen in ed for chest pain left side with radiation to left arm. Chart shows pt has chronic diarrhea and is being seen by Gi; Dr.Vanga. Her EGD and colonoscopy is negative.  PT reports night sweats and LE edema.  C/P x 2 days. Pt reports some burning in her chest. No N/V. Pt has had PE in past and was on anticoagulant in past.  Pt meets Sirs criteria.  LHC In 10/2021:   Mid LAD lesion is 20% stenosed.   The left ventricular systolic function is normal.   LV end diastolic pressure is normal.   The left ventricular ejection fraction is greater than 65% by visual estimate. Last echo in December 2022: Left ventricular ejection fraction, by estimation, is 50 to 55%. The left ventricle has low normal function. The left ventricle has no regional wall motion abnormalities. Left ventricular diastolic parameters are indeterminate. 1. 2. Right ventricular systolic function is normal. The right ventricular size is normal. 3. The mitral valve is normal in structure. No evidence of mitral valve regurgitation. 4. The aortic valve was not well visualized. Aortic valve regurgitation is not visualized. The inferior vena cava is dilated in size with >50% respiratory variability, suggesting right atrial pressure of 8 mmHg.  Pt has past medical history : Past Medical History:  Diagnosis Date   Anxiety    Asthma    Breast cancer ([place]) 2004   left breast   Chronic back pain    COPD (chronic obstructive pulmonary disease) ([place])    Depression    History of kidney surgery    Personal history of radiation therapy    PTSD (post-traumatic stress disorder)     Past Surgical History:  Procedure Laterality Date   BREAST BIOPSY Left 2004   positive   BREAST BIOPSY Right    neg    BREAST LUMPECTOMY Left 2004   positive   COLONOSCOPY WITH PROPOFOL N/A 06/21/2022   Procedure: COLONOSCOPY WITH PROPOFOL;  Surgeon: Lin Landsman, MD;  Location: [facility] ENDOSCOPY;  Service: Gastroenterology;  Laterality: N/A;   ESOPHAGOGASTRODUODENOSCOPY (EGD) WITH PROPOFOL N/A 06/21/2022   Procedure: ESOPHAGOGASTRODUODENOSCOPY (EGD) WITH PROPOFOL;  Surgeon: Lin Landsman, MD;  Location: [facility] ENDOSCOPY;  Service: Gastroenterology;  Laterality: N/A;   LEFT HEART CATH AND CORONARY ANGIOGRAPHY N/A 11/06/2021   Procedure: LEFT HEART CATH AND CORONARY ANGIOGRAPHY;  Surgeon: Corey Skains, MD;  Location: [facility];  Service: Cardiovascular;  Laterality: N/A;   MASTECTOMY Left    MASTECTOMY     NEPHRECTOMY Left    TUBAL LIGATION       reports that she has been smoking cigarettes. She has been smoking an average of .5 packs per day. She has never been exposed to tobacco smoke. She has never used smokeless tobacco. She reports that she does not currently use drugs. She reports that she does not drink alcohol.  Allergies  Allergen Reactions   Desvenlafaxine Anaphylaxis   Morphine And Related Anaphylaxis   Penicillins Anaphylaxis    Has patient had a PCN reaction causing immediate rash, facial/tongue/throat swelling, SOB or lightheadedness with hypotension: Yes Has patient had a PCN reaction causing severe rash involving mucus membranes or skin necrosis: No Has patient had a PCN reaction  that required hospitalization: Unknown Has patient had a PCN reaction occurring within the last 10 years: No If all of the above answers are "NO", then may proceed with Cephalosporin use.    Prazosin Other (See Comments)   Tamoxifen Anaphylaxis   Trazodone Anaphylaxis   Clonazepam    Codeine Swelling   Duloxetine Itching   Gabapentin Hives    Rash and swelling.    Hydroxyzine Itching   Lorazepam     Patient feels this medications makes her to sedated and wants to avoid    Paroxetine Hcl Hives   Sulfa Antibiotics Itching   Cephalexin Rash    Family History  Problem Relation Age of Onset   Breast cancer Cousin    Breast cancer Cousin    Bipolar disorder Daughter    Drug abuse Daughter    Alcohol abuse Maternal Grandfather     Prior to Admission medications   Medication Sig Start Date End Date Taking? Authorizing Provider  albuterol (VENTOLIN HFA) 108 (90 Base) MCG/ACT inhaler Inhale 1-2 puffs into the lungs every 4 (four) hours as needed for cough or wheezing.    [provider]  cholecalciferol (VITAMIN D3) 25 MCG (1000 UNIT) tablet Take 1,000 Units by mouth daily.    [provider]  denosumab (PROLIA) 60 MG/ML SOSY injection Inject 60 mg into the skin every 6 (six) months.    [provider]  EPINEPHrine 0.3 mg/0.3 mL IJ SOAJ injection Inject 0.3 mg into the muscle as needed for anaphylaxis.    [provider]  FLUoxetine (PROZAC) 40 MG capsule Take 40 mg by mouth daily. 05/22/22   [provider]  fluticasone (FLONASE) 50 MCG/ACT nasal spray Place 1 spray into both nostrils 2 (two) times daily. 05/23/22   [provider]  Fluticasone-Umeclidin-Vilant (TRELEGY ELLIPTA) 100-62.5-25 MCG/INH AEPB Inhale 1 puff into the lungs daily.    [provider]  hydrOXYzine (ATARAX) 50 MG tablet Take 50 mg by mouth 3 (three) times daily as needed. 05/22/22   [provider]  ipratropium (ATROVENT) 0.02 % nebulizer solution Take 0.5 mg by nebulization 4 (four) times daily.    [provider]  lidocaine (LIDODERM) 5 % Place 1 patch onto the skin daily. Apply to lower back. Remove & Discard patch within 12 hours or as directed by MD 04/25/22   Annita Brod, MD  methocarbamol (ROBAXIN) 750 MG tablet Take 1 tablet (750 mg total) by mouth 4 (four) times daily. 06/21/22   Danford, Suann Larry, MD  montelukast (SINGULAIR) 10 MG tablet Take 10 mg by mouth daily. 06/07/22   [provider]   pantoprazole (PROTONIX) 40 MG tablet Take 1 tablet (40 mg total) by mouth 2 (two) times daily before a meal. 06/21/22   Danford, Suann Larry, MD  predniSONE (DELTASONE) 10 MG tablet Take 1 tablet (10 mg total) by mouth daily. 05/03/22   Wouk, Ailene Rud, MD  vitamin B-12 (CYANOCOBALAMIN) 500 MCG tablet Take 500 mcg by mouth daily.    [provider]    Review of Systems:  Review of Systems  Cardiovascular:  Positive for chest pain.   ED Course:   > Vitals:   07/15/22 2056 07/15/22 2130 07/15/22 2220 07/15/22 2320  BP: (!) 147/102 (!) 143/100 (!) 143/101 118/87  Pulse: 98 90 94 93  Resp: (!) 23 19 (!) 23 (!) 23  Temp:    98.1 F (36.7 C)  TempSrc:    Oral  SpO2: 97% 98% 98% 94%  Weight:      Height:       > Vitals:   07/15/22 1549 07/15/22 1939 07/15/22 2056 07/15/22 2130  BP: (!) 108/96 (!) 137/102 (!) 147/102 (!) 143/100   07/15/22 2220 07/15/22 2320  BP: (!) 143/101 118/87    >No intake/output data recorded. >No intake/output data recorded. >SpO2: 94 % O2 Flow Rate (L/min): 1 L/min  In ed pt has 10/10 chest pan left sided R to left arm and chest.  With nausea no vomiting and +sob.  Labs show mild elevation of  TNI but repeat EKG shows TWI in last leads.     Results for orders placed or performed during the hospital encounter of 07/15/22 (from the past 24 hour(s))  CBC     Status: Abnormal   Collection Time: 07/15/22  3:58 PM  Result Value Ref Range   WBC 7.8 4.0 - 10.5 K/uL   RBC 5.04 3.87 - 5.11 MIL/uL   Hemoglobin 14.9 12.0 - 15.0 g/dL   HCT 44.4 36.0 - 46.0 %   MCV 88.1 80.0 - 100.0 fL   MCH 29.6 26.0 - 34.0 pg   MCHC 33.6 30.0 - 36.0 g/dL   RDW 15.6 (H) 11.5 - 15.5 %   Platelets 412 (H) 150 - 400 K/uL   nRBC 0.0 0.0 - 0.2 %  Troponin I (High Sensitivity)     Status: Abnormal   Collection Time: 07/15/22  3:58 PM  Result Value Ref Range   Troponin I (High Sensitivity) 29 (H) <18 ng/L  Resp Panel by RT-PCR (Flu A&B, Covid) Anterior Nasal Swab      Status: None   Collection Time: 07/15/22  3:58 PM   Specimen: Anterior Nasal Swab  Result Value Ref Range   SARS Coronavirus 2 by RT PCR NEGATIVE NEGATIVE   Influenza A by PCR NEGATIVE NEGATIVE   Influenza B by PCR NEGATIVE NEGATIVE  Comprehensive metabolic panel     Status: Abnormal   Collection Time: 07/15/22  3:58 PM  Result Value Ref Range   Sodium 128 (L) 135 - 145 mmol/L   Potassium 3.9 3.5 - 5.1 mmol/L   Chloride 94 (L) 98 - 111 mmol/L   CO2 23 22 - 32 mmol/L   Glucose, Bld 90 70 - 99 mg/dL   BUN 5 (L) 8 - 23 mg/dL   Creatinine, Ser 0.37 (L) 0.44 - 1.00 mg/dL   Calcium 8.8 (L) 8.9 - 10.3 mg/dL   Total Protein 6.9 6.5 - 8.1 g/dL   Albumin 3.8 3.5 - 5.0 g/dL   AST 17 15 - 41 U/L   ALT 13 0 - 44 U/L   Alkaline Phosphatase 43 38 - 126 U/L   Total Bilirubin 0.8 0.3 - 1.2 mg/dL   GFR, Estimated >60 >60 mL/min   Anion gap 11 5 - 15  Troponin I (High Sensitivity)     Status: Abnormal   Collection Time: 07/15/22  7:50 PM  Result Value Ref Range   Troponin I (High Sensitivity) 31 (H) <18 ng/L    Urine analysis:    Component Value Date/Time   COLORURINE STRAW (A) 03/20/2022 1149   APPEARANCEUR CLEAR (A) 03/20/2022 1149   LABSPEC 1.005 03/20/2022 1149   PHURINE 7.0 03/20/2022 1149   GLUCOSEU NEGATIVE 03/20/2022 [place] 03/20/2022 [place] 03/20/2022 [place] 03/20/2022 1149   PROTEINUR NEGATIVE 03/20/2022 1149   NITRITE NEGATIVE 03/20/2022 1149   LEUKOCYTESUR NEGATIVE 03/20/2022 1149  EKG: Independently reviewed.  Sinus tach with TWI in lat leads.    Radiological Exams on Admission: CT Angio Chest PE W and/or Wo Contrast  Result Date: 07/15/2022 CLINICAL DATA:  Chest pain radiates down the left arm EXAM: CT ANGIOGRAPHY CHEST WITH CONTRAST TECHNIQUE: Multidetector CT imaging of the chest was performed using the standard protocol during bolus administration of intravenous contrast. Multiplanar CT image  reconstructions and MIPs were obtained to evaluate the vascular anatomy. RADIATION DOSE REDUCTION: This exam was performed according to the departmental dose-optimization program which includes automated exposure control, adjustment of the mA and/or kV according to patient size and/or use of iterative reconstruction technique. CONTRAST:  20m OMNIPAQUE IOHEXOL 350 MG/ML SOLN COMPARISON:  Chest x-ray 07/15/2022, CT chest 06/18/2022 FINDINGS: Cardiovascular: Satisfactory opacification of the pulmonary arteries to the segmental level. No evidence of pulmonary embolism. Normal heart size. No pericardial effusion. Nonaneurysmal aorta. Mild atherosclerosis. No dissection is seen. Mediastinum/Nodes: No enlarged mediastinal, hilar, or axillary lymph nodes. Thyroid gland, trachea, and esophagus demonstrate no significant findings. Lungs/Pleura: Emphysema. No acute airspace disease, pleural effusion, or pneumothorax Upper Abdomen: No acute abnormality. Musculoskeletal: No chest wall abnormality. No acute or significant osseous findings. Old right seventh rib fracture Review of the MIP images confirms the above findings. IMPRESSION: 1. Negative for acute pulmonary embolus. 2. Emphysema without acute airspace disease Aortic Atherosclerosis (ICD10-I70.0) and Emphysema (ICD10-J43.9). Electronically Signed   By: KDonavan FoilM.D.   On: 07/15/2022 23:36   UKoreaVenous Img Lower Unilateral Right  Result Date: 07/15/2022 CLINICAL DATA:  Right lower extremity edema EXAM: Right LOWER EXTREMITY VENOUS DOPPLER ULTRASOUND TECHNIQUE: Gray-scale sonography with compression, as well as color and duplex ultrasound, were performed to evaluate the deep venous system(s) from the level of the common femoral vein through the popliteal and proximal calf veins. COMPARISON:  None Available. FINDINGS: VENOUS Normal compressibility of the common femoral, superficial femoral, and popliteal veins, as well as the visualized calf veins. Visualized  portions of profunda femoral vein and great saphenous vein unremarkable. No filling defects to suggest DVT on grayscale or color Doppler imaging. Doppler waveforms show normal direction of venous flow, normal respiratory plasticity and response to augmentation. Limited views of the contralateral common femoral vein are unremarkable. OTHER None. Limitations: none IMPRESSION: Negative. Electronically Signed   By: KDonavan FoilM.D.   On: 07/15/2022 22:13   DG Chest 2 View  Result Date: 07/15/2022 CLINICAL DATA:  Chest pain and shortness of breath. EXAM: CHEST - 2 VIEW COMPARISON:  Radiograph and CT 06/18/2022 FINDINGS: Hyperinflation. Bronchial thickening has increased from prior exam. Stable heart size and mediastinal contours. No focal airspace disease, pleural effusion, pulmonary edema or pneumothorax. Mild thoracic spondylosis with spurring. IMPRESSION: Increased bronchial thickening from prior exam, may be acute bronchitis or COPD exacerbation. Electronically Signed   By: MKeith RakeM.D.   On: 07/15/2022 17:21       Physical Exam: Vitals:   07/15/22 2056 07/15/22 2130 07/15/22 2220 07/15/22 2320  BP: (!) 147/102 (!) 143/100 (!) 143/101 118/87  Pulse: 98 90 94 93  Resp: (!) 23 19 (!) 23 (!) 23  Temp:    98.1 F (36.7 C)  TempSrc:    Oral  SpO2: 97% 98% 98% 94%  Weight:      Height:       Physical Exam Vitals reviewed.  Constitutional:      General: She is not in acute distress.    Appearance: She is not ill-appearing.  HENT:  Head: Normocephalic and atraumatic.     Right Ear: External ear normal.     Left Ear: External ear normal.     Nose: Nose normal.     Mouth/Throat:     Mouth: Mucous membranes are moist.  Eyes:     Extraocular Movements: Extraocular movements intact.     Pupils: Pupils are equal, round, and reactive to light.  Cardiovascular:     Rate and Rhythm: Normal rate and regular rhythm.     Pulses: Normal pulses.     Heart sounds: Normal heart sounds.   Pulmonary:     Effort: Pulmonary effort is normal.     Breath sounds: Normal breath sounds.  Abdominal:     General: Bowel sounds are normal. There is no distension.     Palpations: Abdomen is soft. There is no mass.     Tenderness: There is no abdominal tenderness. There is no guarding.     Hernia: No hernia is present.  Musculoskeletal:     Right lower leg: No edema.     Left lower leg: No edema.  Skin:    General: Skin is warm.  Neurological:     General: No focal deficit present.     Mental Status: She is alert and oriented to person, place, and time.  Psychiatric:        Mood and Affect: Mood normal.        Behavior: Behavior normal.    Assessment and Plan: * Chest pain Pt presenting with chest pain. EKG meets wellens criteria with TWI in lateral lead.  Cardiology has been consulted- Dr.kowalski.  We will admit pt to stepdown and start NTG/ Heparin / asa and statin.  D/w pt about blood thinners. And possibly of her having heart attack and need to anticoagulate she agrees.   GAD (generalized anxiety disorder) Continue prozac.   COPD (chronic obstructive pulmonary disease) ([place]) Pt states she uses albuterol and has quit smoking.      Unresulted Labs (From admission, onward)     Start     Ordered   07/16/22 0500  Lipid panel  Tomorrow morning,   URGENT        07/16/22 0001   07/16/22 9937  Basic metabolic panel  Tomorrow morning,   STAT        07/16/22 0001   07/16/22 0500  CBC  Tomorrow morning,   STAT        07/16/22 0001   07/16/22 0004  APTT  ONCE - STAT,   R        07/16/22 0003   07/16/22 0004  Protime-INR  ONCE - STAT,   R        07/16/22 0003   07/16/22 0000  T4, free  Once,   URGENT        07/16/22 0001   07/16/22 0000  TSH  Once,   URGENT        07/16/22 0001   07/15/22 1550  Urinalysis, Routine w reflex microscopic  Once,   URGENT        07/15/22 1550             DVT prophylaxis:  scd's   Code Status:  Full Code    Family  Communication:  Whitcomb,Robert (Spouse)  [phone] (Mobile)   Disposition Plan:  Home    Consults called:  Cardiology : Dr. Erin Fulling.   Admission status: observation    Unit: Stepdown.   Para Skeans MD [facility]  6 PM- 2 AM. Please contact me via secure Chat 6 PM-2 AM. [phone] ( Pager ) To contact the [facility] Attending or Consulting provider [place] or covering provider during after hours [place], for this patient.   Check the care team in [facility] and look for a) attending/consulting [facility] provider listed and b) the [facility] team listed Log into [email or web address] and use [place]'s universal password to access. If you do not have the password, please contact the hospital operator. Locate the [facility] provider you are looking for under [facility] and page to a number that you can be directly reached. If you still have difficulty reaching the provider, please page the [facility] (Director on Call) for the Hospitalists listed on amion for assistance. [email or web address] 07/16/2022, 12:19 AM

## 2022-07-15 NOTE — ED Provider Triage Note (Signed)
Emergency Medicine Provider Triage Evaluation Note  Colleen [name] , a 61 y.o. female  was evaluated in triage.  Pt complains of chest pain, shortness of breath, fever, cough. Ongoing CP x 6 mos, worsened 2 days ago. Shortness of breath and cough last 2-3 days with associated fever. No GI symptoms.  Review of Systems  Positive: CP, shortness of breath, cough, fever Negative: HA, nasal congestion, sore throat, GI complaints  Physical Exam  BP (!) 108/96 (BP Location: Right Arm)   Pulse (!) 104   Temp 99.3 F (37.4 C) (Oral)   Resp 20   Ht 5' (1.524 m)   Wt 47.7 kg   SpO2 (!) 89%   BMI 20.54 kg/m  Gen:   Awake, no distress   Resp:  Normal effort  MSK:   Moves extremities without difficulty  Other:    Medical Decision Making  Medically screening exam initiated at 3:55 PM.  Appropriate orders placed.  Colleen [name] was informed that the remainder of the evaluation will be completed by another provider, this initial triage assessment does not replace that evaluation, and the importance of remaining in the ED until their evaluation is complete.  Chest pain worsening over last 2 days. Fever cough as well. Labs, chest xray, ekg, urinalysis, covid test   Darletta Moll, PA-C 07/15/22 1555

## 2022-07-15 NOTE — ED Triage Notes (Signed)
C/O Chest pain x 2-3 days.  States pain radiating down left arm.  Also c/o feet swelling bilaterally and SOB.  C/O cold chills as well.  AAOx3.  Skin warm and dry. NAD

## 2022-07-15 NOTE — H&P (Incomplete)
History and Physical    Colleen [name] LSL:[id number] DOB: 04/11/1961 DOA: 07/15/2022  PCP: Remi Haggard, FNP    Patient coming from:  Home    Chief Complaint:  Chest pain.   HPI:  Colleen [name] is a 61 y.o. female seen in ed for chest pain left side with radiation to left arm. Chart shows pt has chronic diarrhea and is being seen by Gi; Dr.Vanga. Her EGD and colonoscopy is negative.  PT reports night sweats and LE edema.  C/P x 2 days. Pt reports some burning in her chest. No N/V. Pt has had PE in past and was on anticoagulant in past.  Pt meets Sirs criteria.  LHC In 10/2021: .  Mid LAD lesion is 20% stenosed. .  The left ventricular systolic function is normal. .  LV end diastolic pressure is normal. .  The left ventricular ejection fraction is greater than 65% by visual estimate. Last echo in December 2022: Left ventricular ejection fraction, by estimation, is 50 to 55%. The left ventricle has low normal function. The left ventricle has no regional wall motion abnormalities. Left ventricular diastolic parameters are indeterminate. 1. 2. Right ventricular systolic function is normal. The right ventricular size is normal. 3. The mitral valve is normal in structure. No evidence of mitral valve regurgitation. 4. The aortic valve was not well visualized. Aortic valve regurgitation is not visualized. The inferior vena cava is dilated in size with >50% respiratory variability, suggesting right atrial pressure of 8 mmHg.  Pt has past medical history : Past Medical History:  Diagnosis Date  . Anxiety   . Asthma   . Breast cancer ([place]) 2004   left breast  . Chronic back pain   . COPD (chronic obstructive pulmonary disease) ([place])   . Depression   . History of kidney surgery   . Personal history of radiation therapy   . PTSD (post-traumatic stress disorder)     Past Surgical History:  Procedure Laterality Date  . BREAST BIOPSY Left 2004   positive  . BREAST BIOPSY  Right    neg  . BREAST LUMPECTOMY Left 2004   positive  . COLONOSCOPY WITH PROPOFOL N/A 06/21/2022   Procedure: COLONOSCOPY WITH PROPOFOL;  Surgeon: Lin Landsman, MD;  Location: [facility] ENDOSCOPY;  Service: Gastroenterology;  Laterality: N/A;  . ESOPHAGOGASTRODUODENOSCOPY (EGD) WITH PROPOFOL N/A 06/21/2022   Procedure: ESOPHAGOGASTRODUODENOSCOPY (EGD) WITH PROPOFOL;  Surgeon: Lin Landsman, MD;  Location: [facility] ENDOSCOPY;  Service: Gastroenterology;  Laterality: N/A;  . LEFT HEART CATH AND CORONARY ANGIOGRAPHY N/A 11/06/2021   Procedure: LEFT HEART CATH AND CORONARY ANGIOGRAPHY;  Surgeon: Corey Skains, MD;  Location: [facility] LAB;  Service: Cardiovascular;  Laterality: N/A;  . MASTECTOMY Left   . MASTECTOMY    . NEPHRECTOMY Left   . TUBAL LIGATION       reports that she has been smoking cigarettes. She has been smoking an average of .5 packs per day. She has never been exposed to tobacco smoke. She has never used smokeless tobacco. She reports that she does not currently use drugs. She reports that she does not drink alcohol.  Allergies  Allergen Reactions  . Desvenlafaxine Anaphylaxis  . Morphine And Related Anaphylaxis  . Penicillins Anaphylaxis    Has patient had a PCN reaction causing immediate rash, facial/tongue/throat swelling, SOB or lightheadedness with hypotension: Yes Has patient had a PCN reaction causing severe rash involving mucus membranes or skin necrosis: No Has patient had a PCN reaction  that required hospitalization: Unknown Has patient had a PCN reaction occurring within the last 10 years: No If all of the above answers are "NO", then may proceed with Cephalosporin use.   . Prazosin Other (See Comments)  . Tamoxifen Anaphylaxis  . Trazodone Anaphylaxis  . Clonazepam   . Codeine Swelling  . Duloxetine Itching  . Gabapentin Hives    Rash and swelling.   Marland Kitchen Hydroxyzine Itching  . Lorazepam     Patient feels this medications makes her to  sedated and wants to avoid  . Paroxetine Hcl Hives  . Sulfa Antibiotics Itching  . Cephalexin Rash    Family History  Problem Relation Age of Onset  . Breast cancer Cousin   . Breast cancer Cousin   . Bipolar disorder Daughter   . Drug abuse Daughter   . Alcohol abuse Maternal Grandfather     Prior to Admission medications   Medication Sig Start Date End Date Taking? Authorizing Provider  albuterol (VENTOLIN HFA) 108 (90 Base) MCG/ACT inhaler Inhale 1-2 puffs into the lungs every 4 (four) hours as needed for cough or wheezing.    [provider]  cholecalciferol (VITAMIN D3) 25 MCG (1000 UNIT) tablet Take 1,000 Units by mouth daily.    [provider]  denosumab (PROLIA) 60 MG/ML SOSY injection Inject 60 mg into the skin every 6 (six) months.    [provider]  EPINEPHrine 0.3 mg/0.3 mL IJ SOAJ injection Inject 0.3 mg into the muscle as needed for anaphylaxis.    [provider]  FLUoxetine (PROZAC) 40 MG capsule Take 40 mg by mouth daily. 05/22/22   [provider]  fluticasone (FLONASE) 50 MCG/ACT nasal spray Place 1 spray into both nostrils 2 (two) times daily. 05/23/22   [provider]  Fluticasone-Umeclidin-Vilant (TRELEGY ELLIPTA) 100-62.5-25 MCG/INH AEPB Inhale 1 puff into the lungs daily.    [provider]  hydrOXYzine (ATARAX) 50 MG tablet Take 50 mg by mouth 3 (three) times daily as needed. 05/22/22   [provider]  ipratropium (ATROVENT) 0.02 % nebulizer solution Take 0.5 mg by nebulization 4 (four) times daily.    [provider]  lidocaine (LIDODERM) 5 % Place 1 patch onto the skin daily. Apply to lower back. Remove & Discard patch within 12 hours or as directed by MD 04/25/22   Annita Brod, MD  methocarbamol (ROBAXIN) 750 MG tablet Take 1 tablet (750 mg total) by mouth 4 (four) times daily. 06/21/22   Danford, Suann Larry, MD  montelukast (SINGULAIR) 10 MG tablet Take 10 mg by mouth  daily. 06/07/22   [provider]  pantoprazole (PROTONIX) 40 MG tablet Take 1 tablet (40 mg total) by mouth 2 (two) times daily before a meal. 06/21/22   Danford, Suann Larry, MD  predniSONE (DELTASONE) 10 MG tablet Take 1 tablet (10 mg total) by mouth daily. 05/03/22   Wouk, Ailene Rud, MD  vitamin B-12 (CYANOCOBALAMIN) 500 MCG tablet Take 500 mcg by mouth daily.    [provider]    Review of Systems:  Review of Systems  Cardiovascular:  Positive for chest pain.     ED Course:   > Vitals:   07/15/22 2056 07/15/22 2130 07/15/22 2220 07/15/22 2320  BP: (!) 147/102 (!) 143/100 (!) 143/101 118/87  Pulse: 98 90 94 93  Resp: (!) 23 19 (!) 23 (!) 23  Temp:    98.1 F (36.7 C)  TempSrc:    Oral  SpO2: 97% 98%  98% 94%  Weight:      Height:       > Vitals:   07/15/22 1549 07/15/22 1939 07/15/22 2056 07/15/22 2130  BP: (!) 108/96 (!) 137/102 (!) 147/102 (!) 143/100   07/15/22 2220 07/15/22 2320  BP: (!) 143/101 118/87    >No intake/output data recorded. >No intake/output data recorded. >SpO2: 94 % O2 Flow Rate (L/min): 1 L/min  ***    Results for orders placed or performed during the hospital encounter of 07/15/22 (from the past 24 hour(s))  CBC     Status: Abnormal   Collection Time: 07/15/22  3:58 PM  Result Value Ref Range   WBC 7.8 4.0 - 10.5 K/uL   RBC 5.04 3.87 - 5.11 MIL/uL   Hemoglobin 14.9 12.0 - 15.0 g/dL   HCT 44.4 36.0 - 46.0 %   MCV 88.1 80.0 - 100.0 fL   MCH 29.6 26.0 - 34.0 pg   MCHC 33.6 30.0 - 36.0 g/dL   RDW 15.6 (H) 11.5 - 15.5 %   Platelets 412 (H) 150 - 400 K/uL   nRBC 0.0 0.0 - 0.2 %  Troponin I (High Sensitivity)     Status: Abnormal   Collection Time: 07/15/22  3:58 PM  Result Value Ref Range   Troponin I (High Sensitivity) 29 (H) <18 ng/L  Resp Panel by RT-PCR (Flu A&B, Covid) Anterior Nasal Swab     Status: None   Collection Time: 07/15/22  3:58 PM   Specimen: Anterior Nasal Swab  Result Value Ref Range   SARS  Coronavirus 2 by RT PCR NEGATIVE NEGATIVE   Influenza A by PCR NEGATIVE NEGATIVE   Influenza B by PCR NEGATIVE NEGATIVE  Comprehensive metabolic panel     Status: Abnormal   Collection Time: 07/15/22  3:58 PM  Result Value Ref Range   Sodium 128 (L) 135 - 145 mmol/L   Potassium 3.9 3.5 - 5.1 mmol/L   Chloride 94 (L) 98 - 111 mmol/L   CO2 23 22 - 32 mmol/L   Glucose, Bld 90 70 - 99 mg/dL   BUN 5 (L) 8 - 23 mg/dL   Creatinine, Ser 0.37 (L) 0.44 - 1.00 mg/dL   Calcium 8.8 (L) 8.9 - 10.3 mg/dL   Total Protein 6.9 6.5 - 8.1 g/dL   Albumin 3.8 3.5 - 5.0 g/dL   AST 17 15 - 41 U/L   ALT 13 0 - 44 U/L   Alkaline Phosphatase 43 38 - 126 U/L   Total Bilirubin 0.8 0.3 - 1.2 mg/dL   GFR, Estimated >60 >60 mL/min   Anion gap 11 5 - 15  Troponin I (High Sensitivity)     Status: Abnormal   Collection Time: 07/15/22  7:50 PM  Result Value Ref Range   Troponin I (High Sensitivity) 31 (H) <18 ng/L    Urine analysis:    Component Value Date/Time   COLORURINE STRAW (A) 03/20/2022 1149   APPEARANCEUR CLEAR (A) 03/20/2022 1149   LABSPEC 1.005 03/20/2022 1149   PHURINE 7.0 03/20/2022 1149   GLUCOSEU NEGATIVE 03/20/2022 [place] 03/20/2022 [place] 03/20/2022 1149   KETONESUR NEGATIVE 03/20/2022 1149   PROTEINUR NEGATIVE 03/20/2022 1149   NITRITE NEGATIVE 03/20/2022 1149   LEUKOCYTESUR NEGATIVE 03/20/2022 1149    EKG: Independently reviewed.  Sinus tach with TWI in lat leads.    Radiological Exams on Admission: CT Angio Chest PE W and/or Wo Contrast  Result Date: 07/15/2022 CLINICAL DATA:  Chest pain radiates down the left arm EXAM: CT ANGIOGRAPHY CHEST WITH CONTRAST TECHNIQUE: Multidetector CT imaging of the chest was performed using the standard protocol during bolus administration of intravenous contrast. Multiplanar CT image reconstructions and MIPs were obtained to evaluate the vascular anatomy. RADIATION DOSE REDUCTION: This exam was performed according  to the departmental dose-optimization program which includes automated exposure control, adjustment of the mA and/or kV according to patient size and/or use of iterative reconstruction technique. CONTRAST:  31m OMNIPAQUE IOHEXOL 350 MG/ML SOLN COMPARISON:  Chest x-ray 07/15/2022, CT chest 06/18/2022 FINDINGS: Cardiovascular: Satisfactory opacification of the pulmonary arteries to the segmental level. No evidence of pulmonary embolism. Normal heart size. No pericardial effusion. Nonaneurysmal aorta. Mild atherosclerosis. No dissection is seen. Mediastinum/Nodes: No enlarged mediastinal, hilar, or axillary lymph nodes. Thyroid gland, trachea, and esophagus demonstrate no significant findings. Lungs/Pleura: Emphysema. No acute airspace disease, pleural effusion, or pneumothorax Upper Abdomen: No acute abnormality. Musculoskeletal: No chest wall abnormality. No acute or significant osseous findings. Old right seventh rib fracture Review of the MIP images confirms the above findings. IMPRESSION: 1. Negative for acute pulmonary embolus. 2. Emphysema without acute airspace disease Aortic Atherosclerosis (ICD10-I70.0) and Emphysema (ICD10-J43.9). Electronically Signed   By: KDonavan FoilM.D.   On: 07/15/2022 23:36   UKoreaVenous Img Lower Unilateral Right  Result Date: 07/15/2022 CLINICAL DATA:  Right lower extremity edema EXAM: Right LOWER EXTREMITY VENOUS DOPPLER ULTRASOUND TECHNIQUE: Gray-scale sonography with compression, as well as color and duplex ultrasound, were performed to evaluate the deep venous system(s) from the level of the common femoral vein through the popliteal and proximal calf veins. COMPARISON:  None Available. FINDINGS: VENOUS Normal compressibility of the common femoral, superficial femoral, and popliteal veins, as well as the visualized calf veins. Visualized portions of profunda femoral vein and great saphenous vein unremarkable. No filling defects to suggest DVT on grayscale or color Doppler  imaging. Doppler waveforms show normal direction of venous flow, normal respiratory plasticity and response to augmentation. Limited views of the contralateral common femoral vein are unremarkable. OTHER None. Limitations: none IMPRESSION: Negative. Electronically Signed   By: KDonavan FoilM.D.   On: 07/15/2022 22:13   DG Chest 2 View  Result Date: 07/15/2022 CLINICAL DATA:  Chest pain and shortness of breath. EXAM: CHEST - 2 VIEW COMPARISON:  Radiograph and CT 06/18/2022 FINDINGS: Hyperinflation. Bronchial thickening has increased from prior exam. Stable heart size and mediastinal contours. No focal airspace disease, pleural effusion, pulmonary edema or pneumothorax. Mild thoracic spondylosis with spurring. IMPRESSION: Increased bronchial thickening from prior exam, may be acute bronchitis or COPD exacerbation. Electronically Signed   By: MKeith RakeM.D.   On: 07/15/2022 17:21       Physical Exam: Vitals:   07/15/22 2056 07/15/22 2130 07/15/22 2220 07/15/22 2320  BP: (!) 147/102 (!) 143/100 (!) 143/101 118/87  Pulse: 98 90 94 93  Resp: (!) 23 19 (!) 23 (!) 23  Temp:    98.1 F (36.7 C)  TempSrc:    Oral  SpO2: 97% 98% 98% 94%  Weight:      Height:       Physical Exam     Assessment and Plan: No notes have been filed under this hospital service. Service: Hospitalist      ***   Unresulted Labs (From admission, onward)     Start     Ordered   07/15/22 1550  Urinalysis, Routine w reflex microscopic  Once,   URGENT  07/15/22 1550             DVT prophylaxis:  scd's   Code Status:  Full Code    Family Communication:  Thurmond,Robert (Spouse)  [phone] (Mobile)   Disposition Plan:  Home    Consults called:  None   Admission status: observation    Unit: Med tele.    Para Skeans MD [facility]  6 PM- 2 AM. Please contact me via secure Chat 6 PM-2 AM. [phone] ( Pager ) To contact the [facility] Attending or Consulting provider [place] or covering provider during after hours [place], for this patient.   Check the care team in [facility] and look for a) attending/consulting [facility] provider listed and b) the [facility] team listed Log into [email or web address] and use [place]'s universal password to access. If you do not have the password, please contact the hospital operator. Locate the [facility] provider you are looking for under [facility] and page to a number that you can be directly reached. If you still have difficulty reaching the provider, please page the [facility] (Director on Call) for the Hospitalists listed on amion for assistance. [email or web address] 07/15/2022, 11:56 PM

## 2022-07-15 NOTE — ED Provider Notes (Incomplete)
[facility] Provider Note    Event Date/Time   First MD Initiated Contact with Patient 07/15/22 2054     (approximate)   History   Chest Pain   HPI  Colleen [name] is a 61 y.o. female who is had discharged on August 12, reviewed discharge summary notable for active problems of chronic pain, COPD, coronary artery disease tremors malnutrition   Was admitted to the hospital for concerns of right-sided chest and back pain on previous admission.  At that time she had normal troponins and EKGs.  Chest pain is slight the last 2 days, worsening today.  Left upper chest region of the left arm.  Pressure and some burning sensation.  No nausea or vomiting.  Some anxiety but reports currently taking anxiety medication which is helped her from her previous history.  She and her husband relate that over the last week she has had some intermittent swelling from her right mid calf and right knee down into her right leg that comes and goes.  She relates a history of previous blood clot in the lungs and in the distant past but no longer takes any blood thinners   Physical Exam   Triage Vital Signs: ED Triage Vitals  Enc Vitals Group     BP 07/15/22 1549 (!) 108/96     Pulse Rate 07/15/22 1549 (!) 104     Resp 07/15/22 1546 20     Temp 07/15/22 1546 99.3 F (37.4 C)     Temp Source 07/15/22 1546 Oral     SpO2 07/15/22 1549 (!) 89 %     Weight 07/15/22 1548 105 lb 2.6 oz (47.7 kg)     Height 07/15/22 1548 5' (1.524 m)     Head Circumference --      Peak Flow --      Pain Score 07/15/22 1547 10     Pain Loc --      Pain Edu? --      Excl. in [place]? --     Most recent vital signs: Vitals:   07/15/22 2220 07/15/22 2320  BP: (!) 143/101 118/87  Pulse: 94 93  Resp: (!) 23 (!) 23  Temp:  98.1 F (36.7 C)  SpO2: 98% 94%     General: Awake, no distress but appears fairly anxious.  Just slightly tachypneic.  Reports feeling anxious.  Not appear to be any acute  respiratory distress however CV:  Good peripheral perfusion.  Slightly tachycardic Resp:  Normal effort.  Clear lung fields.  Slight prolonged expiratory phase but no frank wheezing or rales.  She has mild tachypnea, but does not clearly have evidence of reactive airway disease on today's exam. Abd:  No distention.  Soft nontender nondistended.  No tenderness to palpation in the left upper quadrant epigastrium or abdomen any quadrant. Other:  Perhaps very mild edema in the right lower extremity compared to the left.  No venous cords or congestions.  No calf tenderness.  Warm well perfused with strong pulses in the lower extremities bilateral.  Query possible slight right leg swelling from the right knee down to the right ankle, very minimal (will ultrasound to evaluate and exclude DVT)   ED Results / Procedures / Treatments   Labs (all labs ordered are listed, but only abnormal results are displayed) Labs Reviewed  CBC - Abnormal; Notable for the following components:      Result Value   RDW 15.6 (*)    Platelets 412 (*)  All other components within normal limits  COMPREHENSIVE METABOLIC PANEL - Abnormal; Notable for the following components:   Sodium 128 (*)    Chloride 94 (*)    BUN 5 (*)    Creatinine, Ser 0.37 (*)    Calcium 8.8 (*)    All other components within normal limits  TROPONIN I (HIGH SENSITIVITY) - Abnormal; Notable for the following components:   Troponin I (High Sensitivity) 29 (*)    All other components within normal limits  TROPONIN I (HIGH SENSITIVITY) - Abnormal; Notable for the following components:   Troponin I (High Sensitivity) 31 (*)    All other components within normal limits  RESP PANEL BY RT-PCR (FLU A&B, COVID) [id number]  URINALYSIS, ROUTINE W REFLEX MICROSCOPIC     EKG  Interpreted by me at 1555 heart rate 110 QRS 80 QTc 480 Sinus tachycardia, mild blood present T wave abnormality is notable in lateral leads though some artifact is present.  No  clear evidence of STEMI     RADIOLOGY Chest x-ray is interpreted by me as negative for acute infiltrate but somewhat hyperexpanded  CT Angio Chest PE W and/or Wo Contrast  Result Date: 07/15/2022 CLINICAL DATA:  Chest pain radiates down the left arm EXAM: CT ANGIOGRAPHY CHEST WITH CONTRAST TECHNIQUE: Multidetector CT imaging of the chest was performed using the standard protocol during bolus administration of intravenous contrast. Multiplanar CT image reconstructions and MIPs were obtained to evaluate the vascular anatomy. RADIATION DOSE REDUCTION: This exam was performed according to the departmental dose-optimization program which includes automated exposure control, adjustment of the mA and/or kV according to patient size and/or use of iterative reconstruction technique. CONTRAST:  75m OMNIPAQUE IOHEXOL 350 MG/ML SOLN COMPARISON:  Chest x-ray 07/15/2022, CT chest 06/18/2022 FINDINGS: Cardiovascular: Satisfactory opacification of the pulmonary arteries to the segmental level. No evidence of pulmonary embolism. Normal heart size. No pericardial effusion. Nonaneurysmal aorta. Mild atherosclerosis. No dissection is seen. Mediastinum/Nodes: No enlarged mediastinal, hilar, or axillary lymph nodes. Thyroid gland, trachea, and esophagus demonstrate no significant findings. Lungs/Pleura: Emphysema. No acute airspace disease, pleural effusion, or pneumothorax Upper Abdomen: No acute abnormality. Musculoskeletal: No chest wall abnormality. No acute or significant osseous findings. Old right seventh rib fracture Review of the MIP images confirms the above findings. IMPRESSION: 1. Negative for acute pulmonary embolus. 2. Emphysema without acute airspace disease Aortic Atherosclerosis (ICD10-I70.0) and Emphysema (ICD10-J43.9). Electronically Signed   By: KDonavan FoilM.D.   On: 07/15/2022 23:36   [place]ous Img Lower Unilateral Right  Result Date: 07/15/2022 CLINICAL DATA:  Right lower extremity edema EXAM: Right  LOWER EXTREMITY VENOUS DOPPLER ULTRASOUND TECHNIQUE: Gray-scale sonography with compression, as well as color and duplex ultrasound, were performed to evaluate the deep venous system(s) from the level of the common femoral vein through the popliteal and proximal calf veins. COMPARISON:  None Available. FINDINGS: VENOUS Normal compressibility of the common femoral, superficial femoral, and popliteal veins, as well as the visualized calf veins. Visualized portions of profunda femoral vein and great saphenous vein unremarkable. No filling defects to suggest DVT on grayscale or color Doppler imaging. Doppler waveforms show normal direction of venous flow, normal respiratory plasticity and response to augmentation. Limited views of the contralateral common femoral vein are unremarkable. OTHER None. Limitations: none IMPRESSION: Negative. Electronically Signed   By: KDonavan FoilM.D.   On: 07/15/2022 22:13   DG Chest 2 View  Result Date: 07/15/2022 CLINICAL DATA:  Chest pain and shortness of breath. EXAM: CHEST -  2 VIEW COMPARISON:  Radiograph and CT 06/18/2022 FINDINGS: Hyperinflation. Bronchial thickening has increased from prior exam. Stable heart size and mediastinal contours. No focal airspace disease, pleural effusion, pulmonary edema or pneumothorax. Mild thoracic spondylosis with spurring. IMPRESSION: Increased bronchial thickening from prior exam, may be acute bronchitis or COPD exacerbation. Electronically Signed   By: Keith Rake M.D.   On: 07/15/2022 17:21   Reviewed radiologist read of chest x-ray notes increased bronchial thickening    PROCEDURES:  Critical Care performed: Yes  CRITICAL CARE Performed by: Delman Kitten   Total critical care time: 35 minutes  Critical care time was exclusive of separately billable procedures and treating other patients.  Critical care was necessary to treat or prevent imminent or life-threatening deterioration.  Critical care was time spent personally  by me on the following activities: development of treatment plan with patient and/or surrogate as well as nursing, discussions with consultants, evaluation of patient's response to treatment, examination of patient, obtaining history from patient or surrogate, ordering and performing treatments and interventions, ordering and review of laboratory studies, ordering and review of radiographic studies, pulse oximetry and re-evaluation of patient's condition.   Procedures   MEDICATIONS ORDERED IN ED: Medications  aspirin chewable tablet 324 mg (324 mg Oral Given 07/15/22 2210)  fentaNYL (SUBLIMAZE) injection 25 mcg (25 mcg Intravenous Given 07/15/22 2211)  ipratropium-albuterol (DUONEB) 0.5-2.5 (3) MG/3ML nebulizer solution 3 mL (3 mLs Nebulization Given 07/15/22 2211)  iohexol (OMNIPAQUE) 350 MG/ML injection 75 mL (75 mLs Intravenous Contrast Given 07/15/22 2257)  fentaNYL (SUBLIMAZE) injection 50 mcg (50 mcg Intravenous Given 07/15/22 2318)     IMPRESSION / MDM / ASSESSMENT AND PLAN / ED COURSE  I reviewed the triage vital signs and the nursing notes.                              Differential diagnosis includes, but is not limited to, ACS, aortic dissection, pulmonary embolism, cardiac tamponade, pneumothorax, pneumonia, pericarditis, myocarditis, GI-related causes including esophagitis/gastritis, and musculoskeletal chest wall pain.    Does not appear to have obvious associated GI symptoms.  Given her concern for slight right leg swelling intermittently for the last week wish to exclude DVT and PE for certain.  We will proceed with CT angiography of chest.  Discussed allergies with the patient, she advised she is able to tolerate fentanyl without allergy for pain.  Also provide aspirin.  Though not severely elevated she does have mild elevation of both first and second troponin with mild nonspecific T wave abnormality without evidence of STEMI.  She has an increased risk of ACS with a known history of  coronary disease though today I do not see clear evidence of NSTEMI.  Wish to rule out ischemia, angina, pulmonary embolism, but also given her chronic history but I suspect not a COPD exacerbation we will also give DuoNeb to see if this alleviates some of her symptomatology.  Patient's presentation is most consistent with acute presentation with potential threat to life or bodily function.  The patient is on the cardiac monitor to evaluate for evidence of arrhythmia and/or significant heart rate changes.    Reviewed cardiac catheterization from December of last year has known mid LAD stenosis  ----------------------------------------- 11:08 PM on 07/15/2022 ----------------------------------------- Reports ongoing chest pain, little if any relief from previous fentanyl.  We will recheck repeat EKG await CT imaging results at this time though on my interpretation I certainly no  acute gross large pulmonary embolism  Repeat EKG and interpreted by me at 11:25 PM Heart rate 90 QRS 90 QTc 540 Normal sinus rhythm, significant ST segment changes now with deep T wave inversions noted in anterior and lateral precordial leads.  No evidence of STEMI but significant change concerning for potential acute coronary syndrome.  Dr. Posey Pronto of the hospitalist service aware of this change, currently assessing the patient and anticipating ordering of heparin and nitrates.  I have placed consult and discussed case with cardiology, Dr. Nehemiah Massed Who is aware of the patient's ongoing chest pain as well as EKG changes and their current recommendation is noted below  Case care consulted with and decision for admission discussed with Dr. Posey Pronto of the hospitalist service  FINAL CLINICAL IMPRESSION(S) / ED DIAGNOSES   Final diagnoses:  Moderate coronary artery risk chest pain  Left-sided chest pain   ----------------------------------------- 12:10 AM on 07/16/2022 ----------------------------------------- Discussed  case repeat EKG and concerns for ACS and possible Wellens type phenomenon developing with cardiologist Dr. Nehemiah Massed who is her cardiologist.  He advised medical management, consider heparin initiation, nitrates, and would recommend against emergent cardiac catheterization at this time unless the patient is to develop changes consistent with STEMI.  At the present time she does not have evidence of STEMI but clearly has concerns for acute coronary syndrome.  Dr. Posey Pronto of the hospitalist service continuing management at this time  Rx / DC Orders   ED Discharge Orders     None        Note:  This document was prepared using Dragon voice recognition software and may include unintentional dictation errors.   Delman Kitten, MD 07/16/22 Nolberto Hanlon    Delman Kitten, MD 07/16/22 [id number]

## 2022-07-16 ENCOUNTER — Inpatient Hospital Stay
Admit: 2022-07-16 | Discharge: 2022-07-16 | Disposition: A | Discharge status: Home / Self Care | Attending: Internal Medicine | Admitting: Internal Medicine

## 2022-07-16 DIAGNOSIS — F411 Generalized anxiety disorder: Secondary | ICD-10-CM | POA: Diagnosis not present

## 2022-07-16 DIAGNOSIS — R072 Precordial pain: Secondary | ICD-10-CM | POA: Diagnosis not present

## 2022-07-16 DIAGNOSIS — R079 Chest pain, unspecified: Secondary | ICD-10-CM | POA: Diagnosis present

## 2022-07-16 DIAGNOSIS — F419 Anxiety disorder, unspecified: Secondary | ICD-10-CM | POA: Diagnosis present

## 2022-07-16 LAB — URINALYSIS, ROUTINE W REFLEX MICROSCOPIC
Bilirubin Urine: NEGATIVE
Glucose, UA: NEGATIVE mg/dL
Hgb urine dipstick: NEGATIVE
Ketones, ur: 5 mg/dL — AB
Leukocytes,Ua: NEGATIVE
Nitrite: NEGATIVE
Protein, ur: NEGATIVE mg/dL
Specific Gravity, Urine: >1.046 — ABNORMAL HIGH (ref 1.005–1.030)
pH: 7 (ref 5.0–8.0)

## 2022-07-16 LAB — LIPID PANEL
Cholesterol: 182 mg/dL (ref 0–200)
HDL: 75 mg/dL (ref >40)
LDL Cholesterol: 93 mg/dL (ref 0–99)
Total CHOL/HDL Ratio: 2.4 RATIO
Triglycerides: 72 mg/dL (ref <150)
VLDL: 14 mg/dL (ref 0–40)

## 2022-07-16 LAB — PROTIME-INR
INR: 1.1 (ref 0.8–1.2)
Prothrombin Time: 13.8 seconds (ref 11.4–15.2)

## 2022-07-16 LAB — CBC
HCT: 42.3 % (ref 36.0–46.0)
Hemoglobin: 14 g/dL (ref 12.0–15.0)
MCH: 29.5 pg (ref 26.0–34.0)
MCHC: 33.1 g/dL (ref 30.0–36.0)
MCV: 89.2 fL (ref 80.0–100.0)
Platelets: 340 10*3/uL (ref 150–400)
RBC: 4.74 MIL/uL (ref 3.87–5.11)
RDW: 15.5 % (ref 11.5–15.5)
WBC: 7.9 10*3/uL (ref 4.0–10.5)
nRBC: 0 % (ref 0.0–0.2)

## 2022-07-16 LAB — BASIC METABOLIC PANEL
Anion gap: 5 (ref 5–15)
BUN: 5 mg/dL — ABNORMAL LOW (ref 8–23)
CO2: 26 mmol/L (ref 22–32)
Calcium: 7.7 mg/dL — ABNORMAL LOW (ref 8.9–10.3)
Chloride: 98 mmol/L (ref 98–111)
Creatinine, Ser: 0.34 mg/dL — ABNORMAL LOW (ref 0.44–1.00)
GFR, Estimated: >60 mL/min (ref >60)
Glucose, Bld: 130 mg/dL — ABNORMAL HIGH (ref 70–99)
Potassium: 3.6 mmol/L (ref 3.5–5.1)
Sodium: 129 mmol/L — ABNORMAL LOW (ref 135–145)

## 2022-07-16 LAB — APTT: aPTT: >200 seconds (ref 24–36)

## 2022-07-16 LAB — TSH: TSH: 1.693 u[IU]/mL (ref 0.350–4.500)

## 2022-07-16 LAB — TROPONIN I (HIGH SENSITIVITY): Troponin I (High Sensitivity): 27 ng/L — ABNORMAL HIGH (ref <18)

## 2022-07-16 LAB — T4, FREE: Free T4: 0.93 ng/dL (ref 0.61–1.12)

## 2022-07-16 LAB — HEPARIN LEVEL (UNFRACTIONATED): Heparin Unfractionated: <0.1 IU/mL — ABNORMAL LOW (ref 0.30–0.70)

## 2022-07-16 MED ORDER — HYDROXYZINE HCL 25 MG PO TABS: 25.0000 mg | ORAL_TABLET | Freq: Three times a day (TID) | ORAL | Status: DC | PRN

## 2022-07-16 MED ORDER — SODIUM CHLORIDE 0.9 % IV SOLN
250.0000 mL | INTRAVENOUS | Status: DC | PRN
  Administered 2022-07-16: 250 mL via INTRAVENOUS

## 2022-07-16 MED ORDER — SODIUM CHLORIDE 0.9% FLUSH
3.0000 mL | Freq: Two times a day (BID) | INTRAVENOUS | Status: DC
  Administered 2022-07-16 – 2022-07-17 (×3): 3 mL via INTRAVENOUS

## 2022-07-16 MED ORDER — HYDROXYZINE HCL 10 MG/5ML PO SYRP: 25.0000 mg | ORAL_SOLUTION | Freq: Three times a day (TID) | ORAL | Status: DC | PRN

## 2022-07-16 MED ORDER — HYDROXYZINE HCL 25 MG PO TABS
50.0000 mg | ORAL_TABLET | Freq: Three times a day (TID) | ORAL | Status: DC | PRN
  Administered 2022-07-16 – 2022-07-17 (×3): 50 mg via ORAL
  Filled 2022-07-16 (×3): qty 2

## 2022-07-16 MED ORDER — ALBUTEROL SULFATE (2.5 MG/3ML) 0.083% IN NEBU
2.5000 mg | INHALATION_SOLUTION | RESPIRATORY_TRACT | Status: DC | PRN
  Administered 2022-07-16: 2.5 mg via RESPIRATORY_TRACT
  Filled 2022-07-16: qty 3

## 2022-07-16 MED ORDER — UMECLIDINIUM BROMIDE 62.5 MCG/ACT IN AEPB
1.0000 | INHALATION_SPRAY | Freq: Every day | RESPIRATORY_TRACT | Status: DC
  Administered 2022-07-17: 1 via RESPIRATORY_TRACT
  Filled 2022-07-16: qty 7

## 2022-07-16 MED ORDER — FLUOXETINE HCL 40 MG PO CAPS: 40.0000 mg | ORAL_CAPSULE | Freq: Every day | ORAL | Status: DC

## 2022-07-16 MED ORDER — HEPARIN (PORCINE) 25000 UT/250ML-% IV SOLN
800.0000 [IU]/h | INTRAVENOUS | Status: DC
  Administered 2022-07-16: 600 [IU]/h via INTRAVENOUS
  Filled 2022-07-16: qty 250

## 2022-07-16 MED ORDER — NITROGLYCERIN 0.4 MG SL SUBL
0.4000 mg | SUBLINGUAL_TABLET | SUBLINGUAL | Status: DC | PRN
  Administered 2022-07-16: 0.4 mg via SUBLINGUAL
  Filled 2022-07-16: qty 1

## 2022-07-16 MED ORDER — PANTOPRAZOLE SODIUM 40 MG PO TBEC
40.0000 mg | DELAYED_RELEASE_TABLET | Freq: Two times a day (BID) | ORAL | Status: DC
  Administered 2022-07-16 – 2022-07-17 (×2): 40 mg via ORAL
  Filled 2022-07-16 (×2): qty 1

## 2022-07-16 MED ORDER — ALPRAZOLAM 0.5 MG PO TABS
0.2500 mg | ORAL_TABLET | Freq: Once | ORAL | Status: AC
Start: 2022-07-16 — End: 2022-07-16
  Administered 2022-07-16: 0.25 mg via ORAL
  Filled 2022-07-16: qty 1

## 2022-07-16 MED ORDER — METHOCARBAMOL 500 MG PO TABS
750.0000 mg | ORAL_TABLET | Freq: Four times a day (QID) | ORAL | Status: DC
  Administered 2022-07-16 – 2022-07-17 (×4): 750 mg via ORAL
  Filled 2022-07-16 (×2): qty 2
  Filled 2022-07-16: qty 1
  Filled 2022-07-16: qty 2
  Filled 2022-07-16: qty 1
  Filled 2022-07-16: qty 2

## 2022-07-16 MED ORDER — ALPRAZOLAM 0.5 MG PO TABS
0.2500 mg | ORAL_TABLET | Freq: Once | ORAL | Status: AC
  Administered 2022-07-16: 0.25 mg via ORAL
  Filled 2022-07-16: qty 1

## 2022-07-16 MED ORDER — ASPIRIN 81 MG PO TBEC
81.0000 mg | DELAYED_RELEASE_TABLET | Freq: Every day | ORAL | Status: DC
  Administered 2022-07-16 – 2022-07-17 (×2): 81 mg via ORAL
  Filled 2022-07-16 (×2): qty 1

## 2022-07-16 MED ORDER — HEPARIN BOLUS VIA INFUSION
2900.0000 [IU] | Freq: Once | INTRAVENOUS | Status: AC
  Administered 2022-07-16: 2900 [IU] via INTRAVENOUS
  Filled 2022-07-16: qty 2900

## 2022-07-16 MED ORDER — HEPARIN BOLUS VIA INFUSION
1450.0000 [IU] | Freq: Once | INTRAVENOUS | Status: AC
Start: 2022-07-16 — End: 2022-07-16
  Administered 2022-07-16: 1450 [IU] via INTRAVENOUS
  Filled 2022-07-16: qty 1450

## 2022-07-16 MED ORDER — FLUOXETINE HCL 20 MG PO CAPS
40.0000 mg | ORAL_CAPSULE | Freq: Every day | ORAL | Status: DC
  Administered 2022-07-16 – 2022-07-17 (×2): 40 mg via ORAL
  Filled 2022-07-16 (×2): qty 2

## 2022-07-16 MED ORDER — ACETAMINOPHEN 325 MG PO TABS
650.0000 mg | ORAL_TABLET | ORAL | Status: DC | PRN
  Administered 2022-07-16 – 2022-07-17 (×5): 650 mg via ORAL
  Filled 2022-07-16 (×5): qty 2

## 2022-07-16 MED ORDER — VITAMIN B-12 1000 MCG PO TABS
500.0000 ug | ORAL_TABLET | Freq: Every day | ORAL | Status: DC
  Administered 2022-07-17: 500 ug via ORAL
  Filled 2022-07-16: qty 1

## 2022-07-16 MED ORDER — FLUTICASONE PROPIONATE 50 MCG/ACT NA SUSP
1.0000 | Freq: Two times a day (BID) | NASAL | Status: DC
Start: 2022-07-16 — End: 2022-07-17
  Filled 2022-07-16: qty 16

## 2022-07-16 MED ORDER — ATORVASTATIN CALCIUM 20 MG PO TABS
40.0000 mg | ORAL_TABLET | Freq: Every day | ORAL | Status: DC
  Administered 2022-07-16: 40 mg via ORAL
  Filled 2022-07-16: qty 2

## 2022-07-16 MED ORDER — VITAMIN D 25 MCG (1000 UNIT) PO TABS
1000.0000 [IU] | ORAL_TABLET | Freq: Every day | ORAL | Status: DC
  Administered 2022-07-16 – 2022-07-17 (×2): 1000 [IU] via ORAL
  Filled 2022-07-16 (×2): qty 1

## 2022-07-16 MED ORDER — MONTELUKAST SODIUM 10 MG PO TABS
10.0000 mg | ORAL_TABLET | Freq: Every day | ORAL | Status: DC
  Administered 2022-07-16: 10 mg via ORAL
  Filled 2022-07-16: qty 1

## 2022-07-16 MED ORDER — SODIUM CHLORIDE 0.9% FLUSH: 3.0000 mL | INTRAVENOUS | Status: DC | PRN

## 2022-07-16 MED ORDER — ONDANSETRON HCL 4 MG/2ML IJ SOLN: 4.0000 mg | Freq: Four times a day (QID) | INTRAMUSCULAR | Status: DC | PRN

## 2022-07-16 MED ORDER — FLUTICASONE FUROATE-VILANTEROL 100-25 MCG/ACT IN AEPB
1.0000 | INHALATION_SPRAY | Freq: Every day | RESPIRATORY_TRACT | Status: DC
  Administered 2022-07-17: 1 via RESPIRATORY_TRACT
  Filled 2022-07-16: qty 28

## 2022-07-16 NOTE — Progress Notes (Signed)
[place] [facility] [place] NAME: Colleen [name]    MR#:  [id number]  DATE OF BIRTH:  09-04-1961  SUBJECTIVE:   Patient handling my panic attack and I am having chronic pain everywhere. Came in with chest pain and significant anxiety. Apparently she ran out of her atarax patient was resting prior to me entering the room. She started hyperventilating as soon as she saw me complaining of panic attack and overall not feeling well.  VITALS:  Blood pressure 108/74, pulse 89, temperature 98.1 F (36.7 C), temperature source Oral, resp. rate (!) 22, height 5' (1.524 m), weight 47.7 kg, SpO2 90 %.  PHYSICAL EXAMINATION:   GENERAL:  61 y.o.-year-old patient lying in the bed with no acute distress. anxious LUNGS: Normal breath sounds bilaterally, no wheezing, rales, rhonchi.  CARDIOVASCULAR: S1, S2 normal. No murmurs, rubs, or gallops.  ABDOMEN: Soft, nontender, nondistended. Bowel sounds present.  EXTREMITIES: No  edema b/l.    NEUROLOGIC: nonfocal  patient is alert and awake. anxious SKIN: No obvious rash, lesion, or ulcer.   LABORATORY PANEL:  CBC Recent Labs  Lab 07/16/22 0045  WBC 7.9  HGB 14.0  HCT 42.3  PLT 340    Chemistries  Recent Labs  Lab 07/15/22 1558 07/16/22 0130  NA 128* 129*  K 3.9 3.6  CL 94* 98  CO2 23 26  GLUCOSE 90 130*  BUN 5* 5*  CREATININE 0.37* 0.34*  CALCIUM 8.8* 7.7*  AST 17  --   ALT 13  --   ALKPHOS 43  --   BILITOT 0.8  --    Cardiac Enzymes No results for input(s): "TROPONINI" in the last 168 hours. RADIOLOGY:  CT Angio Chest PE W and/or Wo Contrast  Result Date: 07/15/2022 CLINICAL DATA:  Chest pain radiates down the left arm EXAM: CT ANGIOGRAPHY CHEST WITH CONTRAST TECHNIQUE: Multidetector CT imaging of the chest was performed using the standard protocol during bolus administration of intravenous contrast. Multiplanar CT image reconstructions and MIPs were obtained to evaluate the vascular anatomy.  RADIATION DOSE REDUCTION: This exam was performed according to the departmental dose-optimization program which includes automated exposure control, adjustment of the mA and/or kV according to patient size and/or use of iterative reconstruction technique. CONTRAST:  14m OMNIPAQUE IOHEXOL 350 MG/ML SOLN COMPARISON:  Chest x-ray 07/15/2022, CT chest 06/18/2022 FINDINGS: Cardiovascular: Satisfactory opacification of the pulmonary arteries to the segmental level. No evidence of pulmonary embolism. Normal heart size. No pericardial effusion. Nonaneurysmal aorta. Mild atherosclerosis. No dissection is seen. Mediastinum/Nodes: No enlarged mediastinal, hilar, or axillary lymph nodes. Thyroid gland, trachea, and esophagus demonstrate no significant findings. Lungs/Pleura: Emphysema. No acute airspace disease, pleural effusion, or pneumothorax Upper Abdomen: No acute abnormality. Musculoskeletal: No chest wall abnormality. No acute or significant osseous findings. Old right seventh rib fracture Review of the MIP images confirms the above findings. IMPRESSION: 1. Negative for acute pulmonary embolus. 2. Emphysema without acute airspace disease Aortic Atherosclerosis (ICD10-I70.0) and Emphysema (ICD10-J43.9). Electronically Signed   By: KDonavan FoilM.D.   On: 07/15/2022 23:36   [place]ous Img Lower Unilateral Right  Result Date: 07/15/2022 CLINICAL DATA:  Right lower extremity edema EXAM: Right LOWER EXTREMITY VENOUS DOPPLER ULTRASOUND TECHNIQUE: Gray-scale sonography with compression, as well as color and duplex ultrasound, were performed to evaluate the deep venous system(s) from the level of the common femoral vein through the popliteal and proximal calf veins. COMPARISON:  None Available. FINDINGS: VENOUS Normal compressibility of the common  femoral, superficial femoral, and popliteal veins, as well as the visualized calf veins. Visualized portions of profunda femoral vein and great saphenous vein unremarkable. No  filling defects to suggest DVT on grayscale or color Doppler imaging. Doppler waveforms show normal direction of venous flow, normal respiratory plasticity and response to augmentation. Limited views of the contralateral common femoral vein are unremarkable. OTHER None. Limitations: none IMPRESSION: Negative. Electronically Signed   By: Donavan Foil M.D.   On: 07/15/2022 22:13   DG Chest 2 View  Result Date: 07/15/2022 CLINICAL DATA:  Chest pain and shortness of breath. EXAM: CHEST - 2 VIEW COMPARISON:  Radiograph and CT 06/18/2022 FINDINGS: Hyperinflation. Bronchial thickening has increased from prior exam. Stable heart size and mediastinal contours. No focal airspace disease, pleural effusion, pulmonary edema or pneumothorax. Mild thoracic spondylosis with spurring. IMPRESSION: Increased bronchial thickening from prior exam, may be acute bronchitis or COPD exacerbation. Electronically Signed   By: Keith Rake M.D.   On: 07/15/2022 17:21    Assessment and Plan  Chest pain appears atypical precipitated with anxiety/panic -- EKG shows sinus tachycardia -- troponin flat -seen by Dr. Nehemiah Massed. Discontinue IV heparin drip. No further cardiac workup needed- --echo pending  Generalized anxiety disorder -- patient apparently ran out of her PRN atarax -- psychiatry consultation made. --Patient advised to resume care with her outpatient psychiatrist Dr.Saramma Eappen -- continue Prozac  Chronic respiratory failure with COPD -chronic home oxygen -- PRN bronchodilators and nebulizer  Chronic pain syndrome  Procedures:none Family communication :none Consults :cardiology, psych CODE STATUS: full DVT Prophylaxis :lovenox Level of care: Telemetry Medical Status is: Observation The patient remains OBS appropriate and will d/c before 2 midnights.    TOTAL TIME TAKING CARE OF THIS PATIENT: 25 minutes.  >50% time spent on counselling and coordination of care  Note: This dictation was  prepared with Dragon dictation along with smaller phrase technology. Any transcriptional errors that result from this process are unintentional.  Fritzi Mandes M.D    [facility]   CC: Primary care physician; Remi Haggard, FNP

## 2022-07-16 NOTE — Consult Note (Signed)
[facility] Face-to-Face Psychiatry Consult   Reason for Consult:  anxiety Referring Physician:  Dr Posey Pronto Patient Identification: Colleen [name] MRN:  [id number] Principal Diagnosis: Chest pain Diagnosis:  Principal Problem:   Chest pain Active Problems:   Generalized anxiety disorder   COPD (chronic obstructive pulmonary disease) ([place])   Total Time spent with patient: 45 minutes  Subjective:   Colleen [name] is a 61 y.o. female patient admitted with chest and hip pain.  HPI:  61 yo female presented with chest pain, consult for severe anxiety.  On assessment, she reports chest and hip pain due to her osteopenia, "I just want it to stop."  When discussing her anxiety and depression, she stated, "I already see a psychiatrist for PTSD, anxiety, and depression and a counselor."  She denies needing any additional mental health needs. Despite notes stating she is awakening frequently in the night and keeping her husband from sleep due to anxiety. Denies suicidal/homicidal ideations, hallucinations, or substance abuse.  Recommend following up with her provider, Dr Shea Evans  Past Psychiatric History: depression, anxiety, PTSD  Risk to Self:  none Risk to Others:  none Prior Inpatient Therapy:  yes Prior Outpatient Therapy:  DR Shea Evans  Past Medical History:  Past Medical History:  Diagnosis Date   Anxiety    Asthma    Breast cancer ([place]) 2004   left breast   Chronic back pain    COPD (chronic obstructive pulmonary disease) ([place])    Depression    History of kidney surgery    Personal history of radiation therapy    PTSD (post-traumatic stress disorder)     Past Surgical History:  Procedure Laterality Date   BREAST BIOPSY Left 2004   positive   BREAST BIOPSY Right    neg   BREAST LUMPECTOMY Left 2004   positive   COLONOSCOPY WITH PROPOFOL N/A 06/21/2022   Procedure: COLONOSCOPY WITH PROPOFOL;  Surgeon: Lin Landsman, MD;  Location: [facility] ENDOSCOPY;  Service: Gastroenterology;   Laterality: N/A;   ESOPHAGOGASTRODUODENOSCOPY (EGD) WITH PROPOFOL N/A 06/21/2022   Procedure: ESOPHAGOGASTRODUODENOSCOPY (EGD) WITH PROPOFOL;  Surgeon: Lin Landsman, MD;  Location: [facility] ENDOSCOPY;  Service: Gastroenterology;  Laterality: N/A;   LEFT HEART CATH AND CORONARY ANGIOGRAPHY N/A 11/06/2021   Procedure: LEFT HEART CATH AND CORONARY ANGIOGRAPHY;  Surgeon: Corey Skains, MD;  Location: [place] [facility];  Service: Cardiovascular;  Laterality: N/A;   MASTECTOMY Left    MASTECTOMY     NEPHRECTOMY Left    TUBAL LIGATION     Family History:  Family History  Problem Relation Age of Onset   Breast cancer Cousin    Breast cancer Cousin    Bipolar disorder Daughter    Drug abuse Daughter    Alcohol abuse Maternal Grandfather    Family Psychiatric  History: see above Social History:  Social History   Substance and Sexual Activity  Alcohol Use Never     Social History   Substance and Sexual Activity  Drug Use Not Currently   Comment: prescribed muscle relaxers and valium    Social History   Socioeconomic History   Marital status: Married    Spouse name: Not on file   Number of children: 2   Years of education: GED   Highest education level: Not on file  Occupational History   Not on file  Tobacco Use   Smoking status: Every Day    Packs/day: 0.50    Types: Cigarettes    Passive exposure: Never  Smokeless tobacco: Never  Vaping Use   Vaping Use: Never used  Substance and Sexual Activity   Alcohol use: Never   Drug use: Not Currently    Comment: prescribed muscle relaxers and valium   Sexual activity: Not Currently  Other Topics Concern   Not on file  Social History Narrative   Not on file   Social Determinants of Health   Financial Resource Strain: Not on file  Food Insecurity: Not on file  Transportation Needs: Not on file  Physical Activity: Not on file  Stress: Not on file  Social Connections: Not on file   Additional Social History:     Allergies:   Allergies  Allergen Reactions   Desvenlafaxine Anaphylaxis   Morphine And Related Anaphylaxis   Penicillins Anaphylaxis    Has patient had a PCN reaction causing immediate rash, facial/tongue/throat swelling, SOB or lightheadedness with hypotension: Yes Has patient had a PCN reaction causing severe rash involving mucus membranes or skin necrosis: No Has patient had a PCN reaction that required hospitalization: Unknown Has patient had a PCN reaction occurring within the last 10 years: No If all of the above answers are "NO", then may proceed with Cephalosporin use.    Prazosin Other (See Comments)   Tamoxifen Anaphylaxis   Trazodone Anaphylaxis   Clonazepam    Codeine Swelling   Duloxetine Itching   Gabapentin Hives    Rash and swelling.    Hydroxyzine Itching   Lorazepam     Patient feels this medications makes her to sedated and wants to avoid   Paroxetine Hcl Hives   Sulfa Antibiotics Itching   Cephalexin Rash    Labs:  Results for orders placed or performed during the hospital encounter of 07/15/22 (from the past 48 hour(s))  CBC     Status: Abnormal   Collection Time: 07/15/22  3:58 PM  Result Value Ref Range   WBC 7.8 4.0 - 10.5 K/uL   RBC 5.04 3.87 - 5.11 MIL/uL   Hemoglobin 14.9 12.0 - 15.0 g/dL   HCT 44.4 36.0 - 46.0 %   MCV 88.1 80.0 - 100.0 fL   MCH 29.6 26.0 - 34.0 pg   MCHC 33.6 30.0 - 36.0 g/dL   RDW 15.6 (H) 11.5 - 15.5 %   Platelets 412 (H) 150 - 400 K/uL   nRBC 0.0 0.0 - 0.2 %    Comment: Performed at [facility], [place], [place] [address]  Troponin I (High Sensitivity)     Status: Abnormal   Collection Time: 07/15/22  3:58 PM  Result Value Ref Range   Troponin I (High Sensitivity) 29 (H) <18 ng/L    Comment: (NOTE) Elevated high sensitivity troponin I (hsTnI) values and significant  changes across serial measurements may suggest ACS but many other  chronic and acute conditions are known to elevate hsTnI  results.  Refer to the "Links" section for chest pain algorithms and additional  guidance. Performed at [facility], [address] [place], [place] [address]   Resp Panel by RT-PCR (Flu A&B, Covid) Anterior Nasal Swab     Status: None   Collection Time: 07/15/22  3:58 PM   Specimen: Anterior Nasal Swab  Result Value Ref Range   SARS Coronavirus 2 by RT PCR NEGATIVE NEGATIVE    Comment: (NOTE) SARS-CoV-2 target nucleic acids are NOT DETECTED.  The SARS-CoV-2 RNA is generally detectable in upper respiratory specimens during the acute phase of infection. The lowest concentration of  SARS-CoV-2 viral copies this assay can detect is 138 copies/mL. A negative result does not preclude SARS-Cov-2 infection and should not be used as the sole basis for treatment or other patient management decisions. A negative result may occur with  improper specimen collection/handling, submission of specimen other than nasopharyngeal swab, presence of viral mutation(s) within the areas targeted by this assay, and inadequate number of viral copies(<138 copies/mL). A negative result must be combined with clinical observations, patient history, and epidemiological information. The expected result is Negative.  Fact Sheet for Patients:  EntrepreneurPulse.com.au  Fact Sheet for Healthcare Providers:  IncredibleEmployment.be  This test is no t yet approved or cleared by the [place] FDA and  has been authorized for detection and/or diagnosis of SARS-CoV-2 by FDA under an Emergency Use Authorization (EUA). This EUA will remain  in effect (meaning this test can be used) for the duration of the COVID-19 declaration under Section 564(b)(1) of the Act, 21 U.S.C.section 360bbb-3(b)(1), unless the authorization is terminated  or revoked sooner.       Influenza A by PCR NEGATIVE NEGATIVE   Influenza B by PCR NEGATIVE NEGATIVE    Comment: (NOTE) The Xpert  Xpress SARS-CoV-2/FLU/RSV plus assay is intended as an aid in the diagnosis of influenza from Nasopharyngeal swab specimens and should not be used as a sole basis for treatment. Nasal washings and aspirates are unacceptable for Xpert Xpress SARS-CoV-2/FLU/RSV testing.  Fact Sheet for Patients: EntrepreneurPulse.com.au  Fact Sheet for Healthcare Providers: IncredibleEmployment.be  This test is not yet approved or cleared by the [place] FDA and has been authorized for detection and/or diagnosis of SARS-CoV-2 by FDA under an Emergency Use Authorization (EUA). This EUA will remain in effect (meaning this test can be used) for the duration of the COVID-19 declaration under Section 564(b)(1) of the Act, 21 U.S.C. section 360bbb-3(b)(1), unless the authorization is terminated or revoked.  Performed at [facility], [address] [place], [place] [address]   Comprehensive metabolic panel     Status: Abnormal   Collection Time: 07/15/22  3:58 PM  Result Value Ref Range   Sodium 128 (L) 135 - 145 mmol/L   Potassium 3.9 3.5 - 5.1 mmol/L   Chloride 94 (L) 98 - 111 mmol/L   CO2 23 22 - 32 mmol/L   Glucose, Bld 90 70 - 99 mg/dL    Comment: Glucose reference range applies only to samples taken after fasting for at least 8 hours.   BUN 5 (L) 8 - 23 mg/dL   Creatinine, Ser 0.37 (L) 0.44 - 1.00 mg/dL   Calcium 8.8 (L) 8.9 - 10.3 mg/dL   Total Protein 6.9 6.5 - 8.1 g/dL   Albumin 3.8 3.5 - 5.0 g/dL   AST 17 15 - 41 U/L   ALT 13 0 - 44 U/L   Alkaline Phosphatase 43 38 - 126 U/L   Total Bilirubin 0.8 0.3 - 1.2 mg/dL   GFR, Estimated >60 >60 mL/min    Comment: (NOTE) Calculated using the CKD-EPI Creatinine Equation (2021)    Anion gap 11 5 - 15    Comment: Performed at [facility], [place], [place] [address]  Troponin I (High Sensitivity)     Status: Abnormal   Collection Time: 07/15/22  7:50 PM  Result Value  Ref Range   Troponin I (High Sensitivity) 31 (H) <18 ng/L    Comment: (NOTE) Elevated high sensitivity troponin I (hsTnI) values and significant  changes across serial measurements  may suggest ACS but many other  chronic and acute conditions are known to elevate hsTnI results.  Refer to the "Links" section for chest pain algorithms and additional  guidance. Performed at [facility], [address] [place], [place] [address]   CBC     Status: None   Collection Time: 07/16/22 12:45 AM  Result Value Ref Range   WBC 7.9 4.0 - 10.5 K/uL   RBC 4.74 3.87 - 5.11 MIL/uL   Hemoglobin 14.0 12.0 - 15.0 g/dL   HCT 42.3 36.0 - 46.0 %   MCV 89.2 80.0 - 100.0 fL   MCH 29.5 26.0 - 34.0 pg   MCHC 33.1 30.0 - 36.0 g/dL   RDW 15.5 11.5 - 15.5 %   Platelets 340 150 - 400 K/uL   nRBC 0.0 0.0 - 0.2 %    Comment: Performed at [facility], [place], [place] [address]  Troponin I (High Sensitivity)     Status: Abnormal   Collection Time: 07/16/22 12:45 AM  Result Value Ref Range   Troponin I (High Sensitivity) 27 (H) <18 ng/L    Comment: (NOTE) Elevated high sensitivity troponin I (hsTnI) values and significant  changes across serial measurements may suggest ACS but many other  chronic and acute conditions are known to elevate hsTnI results.  Refer to the "Links" section for chest pain algorithms and additional  guidance. Performed at [facility], [address] [place], [place] [address]   Basic metabolic panel     Status: Abnormal   Collection Time: 07/16/22  1:30 AM  Result Value Ref Range   Sodium 129 (L) 135 - 145 mmol/L   Potassium 3.6 3.5 - 5.1 mmol/L   Chloride 98 98 - 111 mmol/L   CO2 26 22 - 32 mmol/L   Glucose, Bld 130 (H) 70 - 99 mg/dL    Comment: Glucose reference range applies only to samples taken after fasting for at least 8 hours.   BUN 5 (L) 8 - 23 mg/dL   Creatinine, Ser 0.34 (L) 0.44 - 1.00 mg/dL   Calcium 7.7 (L) 8.9 - 10.3  mg/dL   GFR, Estimated >60 >60 mL/min    Comment: (NOTE) Calculated using the CKD-EPI Creatinine Equation (2021)    Anion gap 5 5 - 15    Comment: Performed at [facility], [address] [place], [place] [address]  T4, free     Status: None   Collection Time: 07/16/22  1:30 AM  Result Value Ref Range   Free T4 0.93 0.61 - 1.12 ng/dL    Comment: (NOTE) Biotin ingestion may interfere with free T4 tests. If the results are inconsistent with the TSH level, previous test results, or the clinical presentation, then consider biotin interference. If needed, order repeat testing after stopping biotin. Performed at [facility], [address] [place], [place] [address]   Lipid panel     Status: None   Collection Time: 07/16/22  1:30 AM  Result Value Ref Range   Cholesterol 182 0 - 200 mg/dL   Triglycerides 72 <150 mg/dL   HDL 75 >40 mg/dL   Total CHOL/HDL Ratio 2.4 RATIO   VLDL 14 0 - 40 mg/dL   LDL Cholesterol 93 0 - 99 mg/dL    Comment:        Total Cholesterol/HDL:CHD Risk Coronary Heart Disease Risk Table  Men   Women  1/2 Average Risk   3.4   3.3  Average Risk       5.0   4.4  2 X Average Risk   9.6   7.1  3 X Average Risk  23.4   11.0        Use the calculated Patient Ratio above and the CHD Risk Table to determine the patient's CHD Risk.        ATP III CLASSIFICATION (LDL):  <100     mg/dL   Optimal  100-129  mg/dL   Near or Above                    Optimal  130-159  mg/dL   Borderline  160-189  mg/dL   High  >190     mg/dL   Very High Performed at [facility], [address] [place], [place] [address]   Protime-INR     Status: None   Collection Time: 07/16/22  1:30 AM  Result Value Ref Range   Prothrombin Time 13.8 11.4 - 15.2 seconds   INR 1.1 0.8 - 1.2    Comment: (NOTE) INR goal varies based on device and disease states. Performed at [facility] [address] [place], [place] [address]    APTT     Status: Abnormal   Collection Time: 07/16/22  1:30 AM  Result Value Ref Range   aPTT >200 (HH) 24 - 36 seconds    Comment: CRITICAL RESULT CALLED TO, READ BACK BY AND VERIFIED WITH: Nyra Capes '@0324'$  on 07/16/22 SKL Performed at [facility], [address] [place], [place] [address]   TSH     Status: None   Collection Time: 07/16/22  1:30 AM  Result Value Ref Range   TSH 1.693 0.350 - 4.500 uIU/mL    Comment: Performed by a 3rd Generation assay with a functional sensitivity of <=0.01 uIU/mL. Performed at [facility], [address] [place], [place] [address]   Urinalysis, Routine w reflex microscopic     Status: Abnormal   Collection Time: 07/16/22  2:46 AM  Result Value Ref Range   Color, Urine YELLOW (A) YELLOW   APPearance CLEAR (A) CLEAR   Specific Gravity, Urine >1.046 (H) 1.005 - 1.030   pH 7.0 5.0 - 8.0   Glucose, UA NEGATIVE NEGATIVE mg/dL   Hgb urine dipstick NEGATIVE NEGATIVE   Bilirubin Urine NEGATIVE NEGATIVE   Ketones, ur 5 (A) NEGATIVE mg/dL   Protein, ur NEGATIVE NEGATIVE mg/dL   Nitrite NEGATIVE NEGATIVE   Leukocytes,Ua NEGATIVE NEGATIVE    Comment: Performed at [facility], [place], [place] [address]  Heparin level (unfractionated)     Status: Abnormal   Collection Time: 07/16/22  7:15 AM  Result Value Ref Range   Heparin Unfractionated <0.10 (L) 0.30 - 0.70 IU/mL    Comment: (NOTE) The clinical reportable range upper limit is being lowered to >1.10 to align with the FDA approved guidance for the current laboratory assay.  If heparin results are below expected values, and patient dosage has  been confirmed, suggest follow up testing of antithrombin III levels. Performed at [facility], [address] [place],  [address]     Current Facility-Administered Medications  Medication Dose Route Frequency Provider Last Rate Last Admin   0.9 %  sodium chloride infusion  250 mL  Intravenous PRN Para Skeans, MD  acetaminophen (TYLENOL) tablet 650 mg  650 mg Oral Q4H PRN Para Skeans, MD   650 mg at 07/16/22 1359   albuterol (PROVENTIL) (2.5 MG/3ML) 0.083% nebulizer solution 2.5 mg  2.5 mg Inhalation Q4H PRN Fritzi Mandes, MD       aspirin EC tablet 81 mg  81 mg Oral Daily Para Skeans, MD   81 mg at 07/16/22 6295   atorvastatin (LIPITOR) tablet 40 mg  40 mg Oral q1800 Para Skeans, MD       cholecalciferol (VITAMIN D3) 25 MCG (1000 UNIT) tablet 1,000 Units  1,000 Units Oral Daily Fritzi Mandes, MD   1,000 Units at 07/16/22 1359   [START ON 07/17/2022] cyanocobalamin (VITAMIN B12) tablet 500 mcg  500 mcg Oral Daily Fritzi Mandes, MD       FLUoxetine (PROZAC) capsule 40 mg  40 mg Oral Daily Foust, Katy L, NP   40 mg at 07/16/22 0207   fluticasone (FLONASE) 50 MCG/ACT nasal spray 1 spray  1 spray Each Nare BID Para Skeans, MD       [START ON 07/17/2022] fluticasone furoate-vilanterol (BREO ELLIPTA) 100-25 MCG/ACT 1 puff  1 puff Inhalation Daily Fritzi Mandes, MD       And   [START ON 07/17/2022] umeclidinium bromide (INCRUSE ELLIPTA) 62.5 MCG/ACT 1 puff  1 puff Inhalation Daily Fritzi Mandes, MD       hydrOXYzine (ATARAX) tablet 50 mg  50 mg Oral TID PRN Fritzi Mandes, MD   50 mg at 07/16/22 1359   methocarbamol (ROBAXIN) tablet 750 mg  750 mg Oral QID Fritzi Mandes, MD       montelukast (SINGULAIR) tablet 10 mg  10 mg Oral Daily Fritzi Mandes, MD       nitroGLYCERIN (NITROSTAT) SL tablet 0.4 mg  0.4 mg Sublingual Q5 Min x 3 PRN Florina Ou V, MD   0.4 mg at 07/16/22 0044   ondansetron (ZOFRAN) injection 4 mg  4 mg Intravenous Q6H PRN Para Skeans, MD       pantoprazole (PROTONIX) EC tablet 40 mg  40 mg Oral BID AC Fritzi Mandes, MD       sodium chloride flush (NS) 0.9 % injection 3 mL  3 mL Intravenous Q12H Florina Ou V, MD   3 mL at 07/16/22 0107   sodium chloride flush (NS) 0.9 % injection 3 mL  3 mL Intravenous PRN Para Skeans, MD       Current Outpatient Medications   Medication Sig Dispense Refill   cholecalciferol (VITAMIN D3) 25 MCG (1000 UNIT) tablet Take 1,000 Units by mouth daily.     FLUoxetine (PROZAC) 40 MG capsule Take 40 mg by mouth daily.     Fluticasone-Umeclidin-Vilant (TRELEGY ELLIPTA) 100-62.5-25 MCG/INH AEPB Inhale 1 puff into the lungs daily.     hydrOXYzine (ATARAX) 50 MG tablet Take 50 mg by mouth 3 (three) times daily as needed.     ipratropium (ATROVENT) 0.02 % nebulizer solution Take 0.5 mg by nebulization 4 (four) times daily.     methocarbamol (ROBAXIN) 750 MG tablet Take 1 tablet (750 mg total) by mouth 4 (four) times daily. 120 tablet 0   montelukast (SINGULAIR) 10 MG tablet Take 10 mg by mouth daily.     pantoprazole (PROTONIX) 40 MG tablet Take 1 tablet (40 mg total) by mouth 2 (two) times daily before a meal. 60 tablet 0   vitamin B-12 (CYANOCOBALAMIN) 500 MCG tablet Take 500 mcg by mouth daily.  albuterol (VENTOLIN HFA) 108 (90 Base) MCG/ACT inhaler Inhale 1-2 puffs into the lungs every 4 (four) hours as needed for cough or wheezing.     denosumab (PROLIA) 60 MG/ML SOSY injection Inject 60 mg into the skin every 6 (six) months.     EPINEPHrine 0.3 mg/0.3 mL IJ SOAJ injection Inject 0.3 mg into the muscle as needed for anaphylaxis.     fluticasone (FLONASE) 50 MCG/ACT nasal spray Place 1 spray into both nostrils 2 (two) times daily.     lidocaine (LIDODERM) 5 % Place 1 patch onto the skin daily. Apply to lower back. Remove & Discard patch within 12 hours or as directed by MD 30 patch 0    Musculoskeletal: Strength & Muscle Tone: decreased Gait & Station:  did not witness Patient leans: N/A  Psychiatric Specialty Exam: Physical Exam Vitals and nursing note reviewed.  HENT:     Head: Normocephalic.  Pulmonary:     Effort: Pulmonary effort is normal.  Neurological:     General: No focal deficit present.     Mental Status: She is alert and oriented to person, place, and time.  Psychiatric:        Attention and  Perception: Attention and perception normal.        Mood and Affect: Mood is anxious and depressed.        Speech: Speech normal.        Behavior: Behavior normal. Behavior is cooperative.        Thought Content: Thought content normal.        Cognition and Memory: Cognition and memory normal.        Judgment: Judgment normal.    Review of Systems  Cardiovascular:  Positive for chest pain.  Musculoskeletal:        Hip pain  Psychiatric/Behavioral:  Positive for depression. The patient is nervous/anxious.   All other systems reviewed and are negative.   Blood pressure 101/77, pulse 87, temperature 98.1 F (36.7 C), temperature source Oral, resp. rate 19, height 5' (1.524 m), weight 47.7 kg, SpO2 93 %.Body mass index is 20.54 kg/m.  General Appearance: Casual  Eye Contact:  Good  Speech:  Normal Rate  Volume:  Normal  Mood:  Anxious and Depressed  Affect:  Congruent  Thought Process:  Coherent and Descriptions of Associations: Intact  Orientation:  Full (Time, Place, and Person)  Thought Content:  WDL and Logical  Suicidal Thoughts:  No  Homicidal Thoughts:  No  Memory:  Immediate;   Good Recent;   Good Remote;   Good  Judgement:  Fair  Insight:  Fair  Psychomotor Activity:  Decreased  Concentration:  Concentration: Good and Attention Span: Good  Recall:  Good  Fund of Knowledge:  Good  Language:  Good  Akathisia:  No  Handed:  Right  AIMS (if indicated):     Assets:  Housing Leisure Time Resilience Social Support  ADL's:  Intact  Cognition:  WNL  Sleep:        Physical Exam: Physical Exam Vitals and nursing note reviewed.  HENT:     Head: Normocephalic.  Pulmonary:     Effort: Pulmonary effort is normal.  Neurological:     General: No focal deficit present.     Mental Status: She is alert and oriented to person, place, and time.  Psychiatric:        Attention and Perception: Attention and perception normal.        Mood and Affect: Mood is  anxious and  depressed.        Speech: Speech normal.        Behavior: Behavior normal. Behavior is cooperative.        Thought Content: Thought content normal.        Cognition and Memory: Cognition and memory normal.        Judgment: Judgment normal.   Review of Systems  Cardiovascular:  Positive for chest pain.  Musculoskeletal:        Hip pain  Psychiatric/Behavioral:  Positive for depression. The patient is nervous/anxious.   All other systems reviewed and are negative.  Blood pressure 101/77, pulse 87, temperature 98.1 F (36.7 C), temperature source Oral, resp. rate 19, height 5' (1.524 m), weight 47.7 kg, SpO2 93 %. Body mass index is 20.54 kg/m.  Treatment Plan Summary: General anxiety disorder: Hydroxyzine 50 mg TID PRN Prozac 40 mg daily  Disposition: No evidence of imminent risk to self or others at present.   Patient does not meet criteria for psychiatric inpatient admission. Supportive therapy provided about ongoing stressors.  Waylan Boga, NP 07/16/2022 4:30 PM

## 2022-07-16 NOTE — ED Notes (Signed)
Request made for transport to the floor ?

## 2022-07-16 NOTE — Assessment & Plan Note (Addendum)
Pt presenting with chest pain. EKG meets wellens criteria with TWI in lateral lead.  Cardiology has been consulted- Dr.kowalski.  We will admit pt to stepdown and start NTG/ Heparin / asa and statin.  D/w pt about blood thinners. And possibly of her having heart attack and need to anticoagulate she agrees.

## 2022-07-16 NOTE — Plan of Care (Signed)
  Problem: Education: Goal: Understanding of cardiac disease, CV risk reduction, and recovery process will improve Outcome: Progressing Goal: Individualized Educational Video(s) Outcome: Progressing   Problem: Activity: Goal: Ability to tolerate increased activity will improve Outcome: Progressing   

## 2022-07-16 NOTE — ED Notes (Signed)
PT given her meds... HR up to 120 RN into room she states having another "panic attack" pt breathing so shallow and fast o2 down to 80%. placed on 2l for sat of 94 and talked her down. MD aware, consult to follow.

## 2022-07-16 NOTE — Progress Notes (Signed)
[place] for heparin infusion Indication: nSTEMI  Allergies  Allergen Reactions   Desvenlafaxine Anaphylaxis   Morphine And Related Anaphylaxis   Penicillins Anaphylaxis    Has patient had a PCN reaction causing immediate rash, facial/tongue/throat swelling, SOB or lightheadedness with hypotension: Yes Has patient had a PCN reaction causing severe rash involving mucus membranes or skin necrosis: No Has patient had a PCN reaction that required hospitalization: Unknown Has patient had a PCN reaction occurring within the last 10 years: No If all of the above answers are "NO", then may proceed with Cephalosporin use.    Prazosin Other (See Comments)   Tamoxifen Anaphylaxis   Trazodone Anaphylaxis   Clonazepam    Codeine Swelling   Duloxetine Itching   Gabapentin Hives    Rash and swelling.    Hydroxyzine Itching   Lorazepam     Patient feels this medications makes her to sedated and wants to avoid   Paroxetine Hcl Hives   Sulfa Antibiotics Itching   Cephalexin Rash    Patient Measurements: Height: 5' (152.4 cm) Weight: 47.7 kg (105 lb 2.6 oz) IBW/kg (Calculated) : 45.5 Heparin Dosing Weight: 47.7 kg  Vital Signs: Temp: 98.1 F (36.7 C) (09/05 2320) Temp Source: Oral (09/05 2320) BP: 118/87 (09/05 2320) Pulse Rate: 93 (09/05 2320)  Labs: Recent Labs    07/15/22 1558 07/15/22 1950  HGB 14.9  --   HCT 44.4  --   PLT 412*  --   CREATININE 0.37*  --   TROPONINIHS 29* 31*    Estimated Creatinine Clearance: 52.4 mL/min (A) (by C-G formula based on SCr of 0.37 mg/dL (L)).   Medical History: Past Medical History:  Diagnosis Date   Anxiety    Asthma    Breast cancer ([place]) 2004   left breast   Chronic back pain    COPD (chronic obstructive pulmonary disease) (HCC)    Depression    History of kidney surgery    Personal history of radiation therapy    PTSD (post-traumatic stress disorder)     Assessment: Pt is a 61 yo  female presenting to ED w/ L side chest pain radiating to L arm, w/ possible nSTEMI.  Cardiology consult pending.  Goal of Therapy:  Heparin level 0.3-0.7 units/ml Monitor platelets by anticoagulation protocol: Yes   Plan:  Bolus 2900 units x 1 Start heparin infusion at 600 units/hr Will check HL in 6 hr after start of infusion CBC daily while on heparin  Renda Rolls, PharmD, [facility] 07/16/2022 12:05 AM

## 2022-07-16 NOTE — ED Notes (Signed)
Assumed care 0730 pt resting with eyes closed at that time. Received in report pt having "panic attacks" throughout the night since husband left. Went to hourly round on pt states she having another panic attack, she breathing fast  worked up, and anxious.  PT states this happens and then makes her chest hurt. She is requesting meds to "calm her down". PT repositioned and assisted with the bathroom. MD advised will monitor.

## 2022-07-16 NOTE — ED Notes (Signed)
Patient sitting on side of bed between bed rails trying to calm down and take deep breaths to resolve panic attack. RN administered prozac 40 mg

## 2022-07-16 NOTE — Assessment & Plan Note (Signed)
Continue prozac. 

## 2022-07-16 NOTE — ED Notes (Signed)
O2 saturation 97% on 2L [place]. Pt states does not wear O2 at home and has COPD. O2 removed will monitor.

## 2022-07-16 NOTE — Consult Note (Signed)
[facility] Consultation Note  Patient ID: Colleen [name], MRN: [id number], DOB/AGE: [birth year] 61 y.o. Admit date: 07/15/2022   Date of Consult: 07/16/2022 Primary Physician: Remi Haggard, FNP Primary Cardiologist: None  Chief Complaint:  Chief Complaint  Patient presents with   Chest Pain   Reason for Consult:  Abnormal EKG with chest pain  HPI: 61 y.o. female with of previous history of asthma breast cancer depression and apparent PTSD who has had a significant exacerbation of anxiety in the last several days to a week.  The patient has had concerns of claims of chest discomfort through her chest off and on with no evidence of significant pattern.  When seen in the emergency room the patient has had an EKG showing normal sinus rhythm with T wave inversions in lead V1 through V3 slightly different from her previous EKGs.  In addition to that the patient has a troponin level of 27/31/29 consistent with demand ischemia rather than acute coronary syndrome and/or myocardial infarction.  The patient has had a previous coronary artery disease by left heart catheterization in December 2022 showing minimal irregularity of the left anterior descending artery with normal LV systolic function.  Currently she was sleeping with no evidence of significant symptoms until she was awakened and then had symptoms of anxiety and chest pain.  Currently she is hemodynamically stable and at low risk for major cardiovascular event.  Past Medical History:  Diagnosis Date   Anxiety    Asthma    Breast cancer ([place]) 2004   left breast   Chronic back pain    COPD (chronic obstructive pulmonary disease) ([place])    Depression    History of kidney surgery    Personal history of radiation therapy    PTSD (post-traumatic stress disorder)       Surgical History:  Past Surgical History:  Procedure Laterality Date   BREAST BIOPSY Left 2004   positive   BREAST BIOPSY Right    neg   BREAST LUMPECTOMY  Left 2004   positive   COLONOSCOPY WITH PROPOFOL N/A 06/21/2022   Procedure: COLONOSCOPY WITH PROPOFOL;  Surgeon: Lin Landsman, MD;  Location: [facility] ENDOSCOPY;  Service: Gastroenterology;  Laterality: N/A;   ESOPHAGOGASTRODUODENOSCOPY (EGD) WITH PROPOFOL N/A 06/21/2022   Procedure: ESOPHAGOGASTRODUODENOSCOPY (EGD) WITH PROPOFOL;  Surgeon: Lin Landsman, MD;  Location: [facility] ENDOSCOPY;  Service: Gastroenterology;  Laterality: N/A;   LEFT HEART CATH AND CORONARY ANGIOGRAPHY N/A 11/06/2021   Procedure: LEFT HEART CATH AND CORONARY ANGIOGRAPHY;  Surgeon: Corey Skains, MD;  Location: [facility];  Service: Cardiovascular;  Laterality: N/A;   MASTECTOMY Left    MASTECTOMY     NEPHRECTOMY Left    TUBAL LIGATION       Home Meds: Prior to Admission medications   Medication Sig Start Date End Date Taking? Authorizing Provider  cholecalciferol (VITAMIN D3) 25 MCG (1000 UNIT) tablet Take 1,000 Units by mouth daily.   Yes [provider]  FLUoxetine (PROZAC) 40 MG capsule Take 40 mg by mouth daily. 05/22/22  Yes [provider]  Fluticasone-Umeclidin-Vilant (TRELEGY ELLIPTA) 100-62.5-25 MCG/INH AEPB Inhale 1 puff into the lungs daily.   Yes [provider]  hydrOXYzine (ATARAX) 50 MG tablet Take 50 mg by mouth 3 (three) times daily as needed. 05/22/22  Yes [provider]  ipratropium (ATROVENT) 0.02 % nebulizer solution Take 0.5 mg by nebulization 4 (four) times daily.   Yes [provider]  methocarbamol (ROBAXIN) 750 MG tablet  Take 1 tablet (750 mg total) by mouth 4 (four) times daily. 06/21/22  Yes Danford, Suann Larry, MD  montelukast (SINGULAIR) 10 MG tablet Take 10 mg by mouth daily. 06/07/22  Yes [provider]  pantoprazole (PROTONIX) 40 MG tablet Take 1 tablet (40 mg total) by mouth 2 (two) times daily before a meal. 06/21/22  Yes Danford, Suann Larry, MD  vitamin B-12 (CYANOCOBALAMIN) 500 MCG tablet Take 500 mcg by  mouth daily.   Yes [provider]  albuterol (VENTOLIN HFA) 108 (90 Base) MCG/ACT inhaler Inhale 1-2 puffs into the lungs every 4 (four) hours as needed for cough or wheezing.    [provider]  denosumab (PROLIA) 60 MG/ML SOSY injection Inject 60 mg into the skin every 6 (six) months.    [provider]  EPINEPHrine 0.3 mg/0.3 mL IJ SOAJ injection Inject 0.3 mg into the muscle as needed for anaphylaxis.    [provider]  fluticasone (FLONASE) 50 MCG/ACT nasal spray Place 1 spray into both nostrils 2 (two) times daily. 05/23/22   [provider]  lidocaine (LIDODERM) 5 % Place 1 patch onto the skin daily. Apply to lower back. Remove & Discard patch within 12 hours or as directed by MD 04/25/22   Annita Brod, MD    Inpatient Medications:   aspirin EC  81 mg Oral Daily   atorvastatin  40 mg Oral q1800   cholecalciferol  1,000 Units Oral Daily   [START ON 07/17/2022] cyanocobalamin  500 mcg Oral Daily   FLUoxetine  40 mg Oral Daily   fluticasone  1 spray Each Nare BID   [START ON 07/17/2022] fluticasone furoate-vilanterol  1 puff Inhalation Daily   And   [START ON 07/17/2022] umeclidinium bromide  1 puff Inhalation Daily   methocarbamol  750 mg Oral QID   montelukast  10 mg Oral Daily   pantoprazole  40 mg Oral BID AC   sodium chloride flush  3 mL Intravenous Q12H    sodium chloride      Allergies:  Allergies  Allergen Reactions   Desvenlafaxine Anaphylaxis   Morphine And Related Anaphylaxis   Penicillins Anaphylaxis    Has patient had a PCN reaction causing immediate rash, facial/tongue/throat swelling, SOB or lightheadedness with hypotension: Yes Has patient had a PCN reaction causing severe rash involving mucus membranes or skin necrosis: No Has patient had a PCN reaction that required hospitalization: Unknown Has patient had a PCN reaction occurring within the last 10 years: No If all of the above answers are "NO", then may proceed  with Cephalosporin use.    Prazosin Other (See Comments)   Tamoxifen Anaphylaxis   Trazodone Anaphylaxis   Clonazepam    Codeine Swelling   Duloxetine Itching   Gabapentin Hives    Rash and swelling.    Hydroxyzine Itching   Lorazepam     Patient feels this medications makes her to sedated and wants to avoid   Paroxetine Hcl Hives   Sulfa Antibiotics Itching   Cephalexin Rash    Social History   Socioeconomic History   Marital status: Married    Spouse name: Not on file   Number of children: 2   Years of education: GED   Highest education level: Not on file  Occupational History   Not on file  Tobacco Use   Smoking status: Every Day    Packs/day: 0.50    Types: Cigarettes    Passive exposure: Never   Smokeless tobacco: Never  Vaping Use   Vaping Use: Never used  Substance and Sexual Activity   Alcohol use: Never   Drug use: Not Currently    Comment: prescribed muscle relaxers and valium   Sexual activity: Not Currently  Other Topics Concern   Not on file  Social History Narrative   Not on file   Social Determinants of Health   Financial Resource Strain: Not on file  Food Insecurity: Not on file  Transportation Needs: Not on file  Physical Activity: Not on file  Stress: Not on file  Social Connections: Not on file  Intimate Partner Violence: Not on file     Family History  Problem Relation Age of Onset   Breast cancer Cousin    Breast cancer Cousin    Bipolar disorder Daughter    Drug abuse Daughter    Alcohol abuse Maternal Grandfather      Review of Systems Positive for chest pain shortness of breath anxiety Negative for: General:  chills, fever, night sweats or weight changes.  Cardiovascular: PND orthopnea syncope dizziness  Dermatological skin lesions rashes Respiratory: Cough congestion Urologic: Frequent urination urination at night and hematuria Abdominal: negative for nausea, vomiting, diarrhea, bright red blood per rectum, melena, or  hematemesis Neurologic: negative for visual changes, and/or hearing changes  All other systems reviewed and are otherwise negative except as noted above.  Labs: No results for input(s): "CKTOTAL", "CKMB", "TROPONINI" in the last 72 hours. Lab Results  Component Value Date   WBC 7.9 07/16/2022   HGB 14.0 07/16/2022   HCT 42.3 07/16/2022   MCV 89.2 07/16/2022   PLT 340 07/16/2022    Recent Labs  Lab 07/15/22 1558 07/16/22 0130  NA 128* 129*  K 3.9 3.6  CL 94* 98  CO2 23 26  BUN 5* 5*  CREATININE 0.37* 0.34*  CALCIUM 8.8* 7.7*  PROT 6.9  --   BILITOT 0.8  --   ALKPHOS 43  --   ALT 13  --   AST 17  --   GLUCOSE 90 130*   Lab Results  Component Value Date   CHOL 182 07/16/2022   HDL 75 07/16/2022   LDLCALC 93 07/16/2022   TRIG 72 07/16/2022   Lab Results  Component Value Date   DDIMER 0.36 06/18/2022    Radiology/Studies:  CT Angio Chest PE W and/or Wo Contrast  Result Date: 07/15/2022 CLINICAL DATA:  Chest pain radiates down the left arm EXAM: CT ANGIOGRAPHY CHEST WITH CONTRAST TECHNIQUE: Multidetector CT imaging of the chest was performed using the standard protocol during bolus administration of intravenous contrast. Multiplanar CT image reconstructions and MIPs were obtained to evaluate the vascular anatomy. RADIATION DOSE REDUCTION: This exam was performed according to the departmental dose-optimization program which includes automated exposure control, adjustment of the mA and/or kV according to patient size and/or use of iterative reconstruction technique. CONTRAST:  47m OMNIPAQUE IOHEXOL 350 MG/ML SOLN COMPARISON:  Chest x-ray 07/15/2022, CT chest 06/18/2022 FINDINGS: Cardiovascular: Satisfactory opacification of the pulmonary arteries to the segmental level. No evidence of pulmonary embolism. Normal heart size. No pericardial effusion. Nonaneurysmal aorta. Mild atherosclerosis. No dissection is seen. Mediastinum/Nodes: No enlarged mediastinal, hilar, or axillary  lymph nodes. Thyroid gland, trachea, and esophagus demonstrate no significant findings. Lungs/Pleura: Emphysema. No acute airspace disease, pleural effusion, or pneumothorax Upper Abdomen: No acute abnormality. Musculoskeletal: No chest wall abnormality. No acute or significant osseous findings. Old right seventh rib fracture Review of the MIP images confirms the above findings. IMPRESSION: 1.  Negative for acute pulmonary embolus. 2. Emphysema without acute airspace disease Aortic Atherosclerosis (ICD10-I70.0) and Emphysema (ICD10-J43.9). Electronically Signed   By: Donavan Foil M.D.   On: 07/15/2022 23:36   US Venous Img Lower Unilateral Right  Result Date: 07/15/2022 CLINICAL DATA:  Right lower extremity edema EXAM: Right LOWER EXTREMITY VENOUS DOPPLER ULTRASOUND TECHNIQUE: Gray-scale sonography with compression, as well as color and duplex ultrasound, were performed to evaluate the deep venous system(s) from the level of the common femoral vein through the popliteal and proximal calf veins. COMPARISON:  None Available. FINDINGS: VENOUS Normal compressibility of the common femoral, superficial femoral, and popliteal veins, as well as the visualized calf veins. Visualized portions of profunda femoral vein and great saphenous vein unremarkable. No filling defects to suggest DVT on grayscale or color Doppler imaging. Doppler waveforms show normal direction of venous flow, normal respiratory plasticity and response to augmentation. Limited views of the contralateral common femoral vein are unremarkable. OTHER None. Limitations: none IMPRESSION: Negative. Electronically Signed   By: Donavan Foil M.D.   On: 07/15/2022 22:13   DG Chest 2 View  Result Date: 07/15/2022 CLINICAL DATA:  Chest pain and shortness of breath. EXAM: CHEST - 2 VIEW COMPARISON:  Radiograph and CT 06/18/2022 FINDINGS: Hyperinflation. Bronchial thickening has increased from prior exam. Stable heart size and mediastinal contours. No focal  airspace disease, pleural effusion, pulmonary edema or pneumothorax. Mild thoracic spondylosis with spurring. IMPRESSION: Increased bronchial thickening from prior exam, may be acute bronchitis or COPD exacerbation. Electronically Signed   By: Keith Rake M.D.   On: 07/15/2022 17:21   DG Lumbar Spine 2-3 Views  Result Date: 06/19/2022 CLINICAL DATA:  Back pain with no known injury EXAM: LUMBAR SPINE - 2-3 VIEW COMPARISON:  Lumbar MRI 05/01/2022 FINDINGS: Mild levocurvature. No acute fracture or evidence of bone lesion. Diffusely maintained disc height. No notable spurring. Atheromatous calcification of the aorta. IMPRESSION: No acute or focal finding.  No focal or notable degenerative change. Electronically Signed   By: Jorje Guild M.D.   On: 06/19/2022 14:46   CT CHEST WO CONTRAST  Result Date: 06/18/2022 CLINICAL DATA:  Chest wall pain, nontraumatic. Infection or inflammation suspected. History of COPD on home oxygen. History of breast cancer. EXAM: CT CHEST WITHOUT CONTRAST TECHNIQUE: Multidetector CT imaging of the chest was performed following the standard protocol without IV contrast. RADIATION DOSE REDUCTION: This exam was performed according to the departmental dose-optimization program which includes automated exposure control, adjustment of the mA and/or kV according to patient size and/or use of iterative reconstruction technique. COMPARISON:  Chest CT 05/01/2022 and 02/22/2022. FINDINGS: Cardiovascular: Minimal atherosclerosis of the aorta, great vessels and coronary arteries. No acute vascular findings on noncontrast imaging. The heart size is normal. There is no pericardial effusion. Mediastinum/Nodes: There are no enlarged mediastinal, hilar or axillary lymph nodes.Hilar assessment is limited by the lack of intravenous contrast, although the hilar contours appear unchanged. The thyroid gland, trachea and esophagus demonstrate no significant findings. Lungs/Pleura: No pleural effusion  or pneumothorax. Moderate to severe centrilobular emphysema with mild diffuse central airway thickening. Subpleural scarring anteriorly in the chest attributed to prior radiation therapy. No suspicious pulmonary nodule or confluent airspace opacity. Upper abdomen: No significant findings are seen within the visualized upper abdomen. Musculoskeletal/Chest wall: There is no chest wall mass or suspicious osseous finding. Old fracture of the right 7th rib posteriorly with incomplete healing, stable. Postsurgical changes in the left breast. IMPRESSION: 1. No acute findings or explanation for the  patient's symptoms. 2. No evidence of local recurrence of breast cancer or metastatic disease. 3. Stable old fracture of the right 7th rib posteriorly with incomplete healing. 4. Coronary and aortic atherosclerosis (ICD10-I70.0). Emphysema (ICD10-J43.9). Electronically Signed   By: Richardean Sale M.D.   On: 06/18/2022 17:56   DG Chest Portable 1 View  Result Date: 06/18/2022 CLINICAL DATA:  Diffuse right-sided pain. EXAM: PORTABLE CHEST 1 VIEW COMPARISON:  Chest x-ray dated May 01, 2022. FINDINGS: The heart size and mediastinal contours are within normal limits. Both lungs are clear. The visualized skeletal structures are unremarkable. IMPRESSION: No active disease. Electronically Signed   By: Titus Dubin M.D.   On: 06/18/2022 10:13    EKG: Normal sinus rhythm with T wave inversions in V1 through V3  Weights: Filed Weights   07/15/22 1548  Weight: 47.7 kg     Physical Exam: Blood pressure 108/74, pulse 89, temperature 98 F (36.7 C), temperature source Oral, resp. rate (!) 22, height 5' (1.524 m), weight 47.7 kg, SpO2 90 %. Body mass index is 20.54 kg/m. General: Well developed, well nourished, in no acute distress. Head eyes ears nose throat: Normocephalic, atraumatic, sclera non-icteric, no xanthomas, nares are without discharge. No apparent thyromegaly and/or mass  Lungs: Normal respiratory effort.   no wheezes, no rales, no rhonchi.  Heart: RRR with normal S1 S2. no murmur gallop, no rub, PMI is normal size and placement, carotid upstroke normal without bruit, jugular venous pressure is normal Abdomen: Soft, non-tender, non-distended with normoactive bowel sounds. No hepatomegaly. No rebound/guarding. No obvious abdominal masses. Abdominal aorta is normal size without bruit Extremities: No edema. no cyanosis, no clubbing, no ulcers  Peripheral : 2+ bilateral upper extremity pulses, 2+ bilateral femoral pulses, 2+ bilateral dorsal pedal pulse Neuro: Alert and oriented. No facial asymmetry. No focal deficit. Moves all extremities spontaneously. Musculoskeletal: Normal muscle tone without kyphosis Psych:  Responds to questions appropriately with a normal affect.    Assessment: 61 year old female with no evidence of significant cardiovascular risk factors having only minimal irregularity of LAD in 2022 having atypical chest discomfort with no current evidence of myocardial infarction congestive heart failure or NSTEMI.  Plan: 1.  Continue treatment options for her current concerns for anxiety chest pain and PTSD 2.  No further cardiac intervention at this time due to no evidence of congestive heart failure myocardial infarction or acute coronary syndrome 3.  Begin ambulation and follow-up for improvements of symptoms and okay for discharged home if patient ambulating well with no further concerns  Signed, Corey Skains M.D. [facility] 07/16/2022, 1:19 PM

## 2022-07-16 NOTE — Assessment & Plan Note (Signed)
Pt states she uses albuterol and has quit smoking.

## 2022-07-16 NOTE — Progress Notes (Signed)
       CROSS COVER NOTE  NAME: Geoffrey Mankin MRN: [id number] DOB : Oct 22, 1961    Date of Service   07/16/2022   HPI/Events of Note   Messaged by nursing reporting M(r)s Holian is having a panic attack.   On arrival to bedside patient is visible anxious endorses increased nervousness and dyspnea. She denies chest pain at this time.  Interventions   Plan: Xanax x1 Continue daily fluoxetine     This document was prepared using Dragon voice recognition software and may include unintentional dictation errors.  Neomia Glass DNP, MHA, FNP-BC Nurse Practitioner [facility] Pager [phone]

## 2022-07-16 NOTE — Progress Notes (Signed)
*  PRELIMINARY RESULTS* Echocardiogram 2D Echocardiogram has been performed.  Colleen [name] [name] 07/16/2022, 1:38 PM

## 2022-07-17 DIAGNOSIS — J449 Chronic obstructive pulmonary disease, unspecified: Secondary | ICD-10-CM

## 2022-07-17 DIAGNOSIS — F419 Anxiety disorder, unspecified: Secondary | ICD-10-CM | POA: Diagnosis not present

## 2022-07-17 DIAGNOSIS — F418 Other specified anxiety disorders: Secondary | ICD-10-CM | POA: Diagnosis not present

## 2022-07-17 LAB — ECHOCARDIOGRAM COMPLETE
AR max vel: 2.56 cm2
AV Area VTI: 3.1 cm2
AV Area mean vel: 2.74 cm2
AV Mean grad: 3 mmHg
AV Peak grad: 5 mmHg
Ao pk vel: 1.12 m/s
Area-P 1/2: 6.9 cm2
Height: 60 in
S' Lateral: 2.84 cm
Weight: 1682.55 oz

## 2022-07-17 MED ORDER — CARVEDILOL 3.125 MG PO TABS: 3.1250 mg | ORAL_TABLET | Freq: Two times a day (BID) | ORAL | 0 refills | Status: AC

## 2022-07-17 MED ORDER — IBUPROFEN 400 MG PO TABS
400.0000 mg | ORAL_TABLET | Freq: Once | ORAL | Status: AC
  Administered 2022-07-17: 400 mg via ORAL
  Filled 2022-07-17: qty 1

## 2022-07-17 MED ORDER — HYDROXYZINE HCL 50 MG PO TABS: 50.0000 mg | ORAL_TABLET | Freq: Three times a day (TID) | ORAL | 0 refills | Status: AC | PRN

## 2022-07-17 MED ORDER — ASPIRIN 81 MG PO TBEC: 81.0000 mg | DELAYED_RELEASE_TABLET | Freq: Every day | ORAL | 12 refills | Status: AC

## 2022-07-17 MED ORDER — ALBUTEROL SULFATE HFA 108 (90 BASE) MCG/ACT IN AERS: 1.0000 | INHALATION_SPRAY | RESPIRATORY_TRACT | 2 refills | Status: DC | PRN

## 2022-07-17 MED ORDER — FLUOXETINE HCL 40 MG PO CAPS: 40.0000 mg | ORAL_CAPSULE | Freq: Every day | ORAL | 3 refills | Status: DC

## 2022-07-17 MED ORDER — CARVEDILOL 6.25 MG PO TABS: 3.1250 mg | ORAL_TABLET | Freq: Two times a day (BID) | ORAL | Status: DC

## 2022-07-17 NOTE — Plan of Care (Signed)
  Problem: Pain Managment: Goal: General experience of comfort will improve Outcome: Progressing   Problem: Safety: Goal: Ability to remain free from injury will improve Outcome: Progressing   Problem: Skin Integrity: Goal: Risk for impaired skin integrity will decrease Outcome: Progressing   

## 2022-07-17 NOTE — TOC Initial Note (Signed)
Transition of Care [facility]) - Initial/Assessment Note    Patient Details  Name: Colleen [name] MRN: [id number] Date of Birth: 06/01/1961  Transition of Care [facility]) CM/SW Contact:    Beverly Sessions, RN Phone Number: 07/17/2022, 10:46 AM  Clinical Narrative:                  Patient to discharge today.  Patient was assessed by this RNCM on 06/20/22.  See note from that admission below "Admitted for: right sided chest pain Admitted from: Home him husband PCP: [place]     Previous discharge from [facility] patient was set up with [facility]. Per Corene Cornea with [facility] services were declined after patient got home.    PT eval pending.  Discussed option arranging home health services, patient declined  "  Patient received RW and BSC previous admission        Patient Goals and CMS Choice        Expected Discharge Plan and Services           Expected Discharge Date: 07/17/22                                    Prior Living Arrangements/Services                       Activities of Daily Living Home Assistive Devices/Equipment: Eyeglasses ADL Screening (condition at time of admission) Patient's cognitive ability adequate to safely complete daily activities?: Yes Is the patient deaf or have difficulty hearing?: No Does the patient have difficulty seeing, even when wearing glasses/contacts?: No Does the patient have difficulty concentrating, remembering, or making decisions?: No Patient able to express need for assistance with ADLs?: Yes Does the patient have difficulty dressing or bathing?: No Independently performs ADLs?: Yes (appropriate for developmental age) Does the patient have difficulty walking or climbing stairs?: No Weakness of Legs: None Weakness of Arms/Hands: None  Permission Sought/Granted                  Emotional Assessment              Admission diagnosis:  Anxiety [F41.9] Moderate coronary  artery risk chest pain [R07.9] Chest pain [R07.9] Left-sided chest pain [R07.9] Patient Active Problem List   Diagnosis Date Noted   Chest pain 07/16/2022   Protein-calorie malnutrition, moderate ([place]) 06/21/2022   Chronic diarrhea of unknown origin    Loss of weight    Gastric erosion    Dysphagia 06/20/2022   Right buttock pain 06/19/2022   Right-sided chest pain 06/18/2022   Coronary artery disease 03/10/2022   Dependence on nocturnal oxygen therapy 03/10/2022   At risk for prolonged QT interval syndrome 11/19/2021   Tobacco use disorder 06/19/2021   PTSD (post-traumatic stress disorder) 03/04/2021   Cervical myofascial pain syndrome 07/09/2020   Chronic pain syndrome 06/04/2020   Osteopenia of multiple sites 06/04/2020   Age related osteoporosis 06/04/2020   Low back pain 04/27/2020   Panic attack 03/20/2020   COPD (chronic obstructive pulmonary disease) ([place]) 10/11/2018   Generalized anxiety disorder 10/11/2018   PCP:  Remi Haggard, FNP [id number] [place]Phone: [phone]: [phone]    Social Determinants of Health (SDOH) Interventions    Readmission Risk Interventions  07/17/2022   10:41 AM 03/10/2022    9:51 AM 02/14/2022    1:57 PM  Readmission Risk Prevention Plan  Transportation Screening Complete Complete Complete  Medication Review ([place]) Complete Complete Complete  PCP or Specialist appointment within 3-5 days of discharge  Complete Complete  HRI or Home Care Consult Patient refused    SW Recovery Care/Counseling Consult Complete Complete   Palliative Care Screening Not Applicable Not Applicable Not [place] Not Applicable Not Applicable Not Applicable

## 2022-07-17 NOTE — Progress Notes (Addendum)
Pt's husband did not bring home O2 tank to hospital. Pt stating she does not need to wear O2 for the ride home "4 miles away". Pt reports she has not been wearing O2 regularly at home PTA.  AVS given and reviewed with pt and husband. Medications discussed. All questions answered. Pt and husband verbalized understanding of information given. Pt escorted off the unit with all belongings via wheelchair by volunteer services.

## 2022-07-17 NOTE — Discharge Summary (Signed)
Physician Discharge Summary   Patient: Colleen [name] MRN: [id number] DOB: 03-31-1961  Admit date:     07/15/2022  Discharge date: 07/17/22  Discharge Physician: Fritzi Mandes   PCP: Remi Haggard, FNP   Recommendations at discharge:    F/u PCP?NP in 1 week F/u Dr Nehemiah Massed in 1-2 weeks Pt's husband to call [employer] in regards to getting new Psychiatrist establishment. Pt to continue counselling thru [employer]  Discharge Diagnoses: Chest pain--atypical Chronic anxiety with Chronic pain syndrome Stress induced cardiomyopathy Chronic COPD on chronic oxygen  Hospital Course: Chest pain appears atypical precipitated with anxiety/panic -- EKG shows sinus tachycardia -- troponin flat -seen by Dr. Nehemiah Massed. Discontinue IV heparin drip. No further cardiac workup needed at present --echo read and prelim results per Dr Nehemiah Massed shows EF 40%, mild hypokinesis suspected mild Cardiomyopathy--stress induced(anxiety) --recommends start low dose BB-- coreg 31.25 mg bid--f/u Dr Raliegh Ip in 2 weeks. --this was d/w pt's husband on the phone.   Generalized anxiety disorder -- patient apparently ran out of her PRN atarax -- psychiatry consultation made--recommends cont prozac and vistaril --Patient advised to resume care with her outpatient psychiatrist Dr.Saramma Eappen. --she also gets counselling thru [facility] (per husband). Pt's husband is in the process of getting a new psychiatrist   Chronic respiratory failure with COPD -chronic home oxygen -- PRN bronchodilators and nebulizer --sats 99-100% on Ra.. has oxygen at home   Chronic pain syndrome  --prn tylenol and ibuprofen --robaxin   Medical w/u completed. No further recs per psychiatrist. Above all was d/w Mr Counterman on the phone--voiced understanding.  D/c home Prozac, atarax, coreg ,albuterol inhalers rx's called in today   Procedures:none Family communication :none Consults :cardiology, psych CODE STATUS: full      Disposition:  Home Diet recommendation:  Discharge Diet Orders (From admission, onward)     Start     Ordered   07/17/22 0000  Diet - low sodium heart healthy        07/17/22 1009   07/17/22 0000  Diet - low sodium heart healthy        07/17/22 1014           Cardiac diet DISCHARGE MEDICATION: Allergies as of 07/17/2022       Reactions   Desvenlafaxine Anaphylaxis   Morphine And Related Anaphylaxis   Penicillins Anaphylaxis   Has patient had a PCN reaction causing immediate rash, facial/tongue/throat swelling, SOB or lightheadedness with hypotension: Yes Has patient had a PCN reaction causing severe rash involving mucus membranes or skin necrosis: No Has patient had a PCN reaction that required hospitalization: Unknown Has patient had a PCN reaction occurring within the last 10 years: No If all of the above answers are "NO", then may proceed with Cephalosporin use.   Prazosin Other (See Comments)   Tamoxifen Anaphylaxis   Trazodone Anaphylaxis   Clonazepam    Codeine Swelling   Duloxetine Itching   Gabapentin Hives   Rash and swelling.    Hydroxyzine Itching   Lorazepam    Patient feels this medications makes her to sedated and wants to avoid   Paroxetine Hcl Hives   Sulfa Antibiotics Itching   Cephalexin Rash        Medication List     STOP taking these medications    lidocaine 5 % Commonly known as: LIDODERM       TAKE these medications    albuterol 108 (90 Base) MCG/ACT inhaler Commonly known as: VENTOLIN HFA Inhale 1-2 puffs into the lungs  every 4 (four) hours as needed. What changed: reasons to take this   aspirin EC 81 MG tablet Take 1 tablet (81 mg total) by mouth daily. Swallow whole. Start taking on: July 18, 2022   carvedilol 3.125 MG tablet Commonly known as: COREG Take 1 tablet (3.125 mg total) by mouth 2 (two) times daily with a meal.   cholecalciferol 25 MCG (1000 UNIT) tablet Commonly known as: VITAMIN D3 Take 1,000 Units by mouth daily.    cyanocobalamin 500 MCG tablet Commonly known as: VITAMIN B12 Take 500 mcg by mouth daily.   denosumab 60 MG/ML Sosy injection Commonly known as: PROLIA Inject 60 mg into the skin every 6 (six) months.   EPINEPHrine 0.3 mg/0.3 mL Soaj injection Commonly known as: EPI-PEN Inject 0.3 mg into the muscle as needed for anaphylaxis.   FLUoxetine 40 MG capsule Commonly known as: PROZAC Take 1 capsule (40 mg total) by mouth daily.   fluticasone 50 MCG/ACT nasal spray Commonly known as: FLONASE Place 1 spray into both nostrils 2 (two) times daily.   hydrOXYzine 50 MG tablet Commonly known as: ATARAX Take 1 tablet (50 mg total) by mouth 3 (three) times daily as needed.   ipratropium 0.02 % nebulizer solution Commonly known as: ATROVENT Take 0.5 mg by nebulization 4 (four) times daily.   methocarbamol 750 MG tablet Commonly known as: ROBAXIN Take 1 tablet (750 mg total) by mouth 4 (four) times daily.   montelukast 10 MG tablet Commonly known as: SINGULAIR Take 10 mg by mouth daily.   pantoprazole 40 MG tablet Commonly known as: PROTONIX Take 1 tablet (40 mg total) by mouth 2 (two) times daily before a meal.   Trelegy Ellipta 100-62.5-25 MCG/ACT Aepb Generic drug: Fluticasone-Umeclidin-Vilant Inhale 1 puff into the lungs daily.        Follow-up Information     Remi Haggard, FNP. Schedule an appointment as soon as possible for a visit in 1 week(s).   Specialty: Family Medicine Why: hospital f/u Contact information: [place] [address] [phone]         Corey Skains, MD. Schedule an appointment as soon as possible for a visit in 2 week(s).   Specialty: Cardiology Why: stress induced cardiomyopathy Contact information: [address] [facility] [place] [address] [phone]                Discharge Exam: Danley Danker Weights   07/15/22 1548  Weight: 47.7 kg     Condition at discharge:  fair  The results of significant diagnostics from this hospitalization (including imaging, microbiology, ancillary and laboratory) are listed below for reference.   Imaging Studies: CT Angio Chest PE W and/or Wo Contrast  Result Date: 07/15/2022 CLINICAL DATA:  Chest pain radiates down the left arm EXAM: CT ANGIOGRAPHY CHEST WITH CONTRAST TECHNIQUE: Multidetector CT imaging of the chest was performed using the standard protocol during bolus administration of intravenous contrast. Multiplanar CT image reconstructions and MIPs were obtained to evaluate the vascular anatomy. RADIATION DOSE REDUCTION: This exam was performed according to the departmental dose-optimization program which includes automated exposure control, adjustment of the mA and/or kV according to patient size and/or use of iterative reconstruction technique. CONTRAST:  57m OMNIPAQUE IOHEXOL 350 MG/ML SOLN COMPARISON:  Chest x-ray 07/15/2022, CT chest 06/18/2022 FINDINGS: Cardiovascular: Satisfactory opacification of the pulmonary arteries to the segmental level. No evidence of pulmonary embolism. Normal heart size. No pericardial effusion. Nonaneurysmal aorta. Mild atherosclerosis. No dissection is seen. Mediastinum/Nodes:  No enlarged mediastinal, hilar, or axillary lymph nodes. Thyroid gland, trachea, and esophagus demonstrate no significant findings. Lungs/Pleura: Emphysema. No acute airspace disease, pleural effusion, or pneumothorax Upper Abdomen: No acute abnormality. Musculoskeletal: No chest wall abnormality. No acute or significant osseous findings. Old right seventh rib fracture Review of the MIP images confirms the above findings. IMPRESSION: 1. Negative for acute pulmonary embolus. 2. Emphysema without acute airspace disease Aortic Atherosclerosis (ICD10-I70.0) and Emphysema (ICD10-J43.9). Electronically Signed   By: Donavan Foil M.D.   On: 07/15/2022 23:36   US Venous Img Lower Unilateral Right  Result Date: 07/15/2022 CLINICAL  DATA:  Right lower extremity edema EXAM: Right LOWER EXTREMITY VENOUS DOPPLER ULTRASOUND TECHNIQUE: Gray-scale sonography with compression, as well as color and duplex ultrasound, were performed to evaluate the deep venous system(s) from the level of the common femoral vein through the popliteal and proximal calf veins. COMPARISON:  None Available. FINDINGS: VENOUS Normal compressibility of the common femoral, superficial femoral, and popliteal veins, as well as the visualized calf veins. Visualized portions of profunda femoral vein and great saphenous vein unremarkable. No filling defects to suggest DVT on grayscale or color Doppler imaging. Doppler waveforms show normal direction of venous flow, normal respiratory plasticity and response to augmentation. Limited views of the contralateral common femoral vein are unremarkable. OTHER None. Limitations: none IMPRESSION: Negative. Electronically Signed   By: Donavan Foil M.D.   On: 07/15/2022 22:13   DG Chest 2 View  Result Date: 07/15/2022 CLINICAL DATA:  Chest pain and shortness of breath. EXAM: CHEST - 2 VIEW COMPARISON:  Radiograph and CT 06/18/2022 FINDINGS: Hyperinflation. Bronchial thickening has increased from prior exam. Stable heart size and mediastinal contours. No focal airspace disease, pleural effusion, pulmonary edema or pneumothorax. Mild thoracic spondylosis with spurring. IMPRESSION: Increased bronchial thickening from prior exam, may be acute bronchitis or COPD exacerbation. Electronically Signed   By: Keith Rake M.D.   On: 07/15/2022 17:21   DG Lumbar Spine 2-3 Views  Result Date: 06/19/2022 CLINICAL DATA:  Back pain with no known injury EXAM: LUMBAR SPINE - 2-3 VIEW COMPARISON:  Lumbar MRI 05/01/2022 FINDINGS: Mild levocurvature. No acute fracture or evidence of bone lesion. Diffusely maintained disc height. No notable spurring. Atheromatous calcification of the aorta. IMPRESSION: No acute or focal finding.  No focal or notable  degenerative change. Electronically Signed   By: Jorje Guild M.D.   On: 06/19/2022 14:46   CT CHEST WO CONTRAST  Result Date: 06/18/2022 CLINICAL DATA:  Chest wall pain, nontraumatic. Infection or inflammation suspected. History of COPD on home oxygen. History of breast cancer. EXAM: CT CHEST WITHOUT CONTRAST TECHNIQUE: Multidetector CT imaging of the chest was performed following the standard protocol without IV contrast. RADIATION DOSE REDUCTION: This exam was performed according to the departmental dose-optimization program which includes automated exposure control, adjustment of the mA and/or kV according to patient size and/or use of iterative reconstruction technique. COMPARISON:  Chest CT 05/01/2022 and 02/22/2022. FINDINGS: Cardiovascular: Minimal atherosclerosis of the aorta, great vessels and coronary arteries. No acute vascular findings on noncontrast imaging. The heart size is normal. There is no pericardial effusion. Mediastinum/Nodes: There are no enlarged mediastinal, hilar or axillary lymph nodes.Hilar assessment is limited by the lack of intravenous contrast, although the hilar contours appear unchanged. The thyroid gland, trachea and esophagus demonstrate no significant findings. Lungs/Pleura: No pleural effusion or pneumothorax. Moderate to severe centrilobular emphysema with mild diffuse central airway thickening. Subpleural scarring anteriorly in the chest attributed to prior radiation therapy.  No suspicious pulmonary nodule or confluent airspace opacity. Upper abdomen: No significant findings are seen within the visualized upper abdomen. Musculoskeletal/Chest wall: There is no chest wall mass or suspicious osseous finding. Old fracture of the right 7th rib posteriorly with incomplete healing, stable. Postsurgical changes in the left breast. IMPRESSION: 1. No acute findings or explanation for the patient's symptoms. 2. No evidence of local recurrence of breast cancer or metastatic  disease. 3. Stable old fracture of the right 7th rib posteriorly with incomplete healing. 4. Coronary and aortic atherosclerosis (ICD10-I70.0). Emphysema (ICD10-J43.9). Electronically Signed   By: Richardean Sale M.D.   On: 06/18/2022 17:56   DG Chest Portable 1 View  Result Date: 06/18/2022 CLINICAL DATA:  Diffuse right-sided pain. EXAM: PORTABLE CHEST 1 VIEW COMPARISON:  Chest x-ray dated May 01, 2022. FINDINGS: The heart size and mediastinal contours are within normal limits. Both lungs are clear. The visualized skeletal structures are unremarkable. IMPRESSION: No active disease. Electronically Signed   By: Titus Dubin M.D.   On: 06/18/2022 10:13    Microbiology: Results for orders placed or performed during the hospital encounter of 07/15/22  Resp Panel by RT-PCR (Flu A&B, Covid) Anterior Nasal Swab     Status: None   Collection Time: 07/15/22  3:58 PM   Specimen: Anterior Nasal Swab  Result Value Ref Range Status   SARS Coronavirus 2 by RT PCR NEGATIVE NEGATIVE Final    Comment: (NOTE) SARS-CoV-2 target nucleic acids are NOT DETECTED.  The SARS-CoV-2 RNA is generally detectable in upper respiratory specimens during the acute phase of infection. The lowest concentration of SARS-CoV-2 viral copies this assay can detect is 138 copies/mL. A negative result does not preclude SARS-Cov-2 infection and should not be used as the sole basis for treatment or other patient management decisions. A negative result may occur with  improper specimen collection/handling, submission of specimen other than nasopharyngeal swab, presence of viral mutation(s) within the areas targeted by this assay, and inadequate number of viral copies(<138 copies/mL). A negative result must be combined with clinical observations, patient history, and epidemiological information. The expected result is Negative.  Fact Sheet for Patients:  EntrepreneurPulse.com.au  Fact Sheet for Healthcare  Providers:  IncredibleEmployment.be  This test is no t yet approved or cleared by the [place] FDA and  has been authorized for detection and/or diagnosis of SARS-CoV-2 by FDA under an Emergency Use Authorization (EUA). This EUA will remain  in effect (meaning this test can be used) for the duration of the COVID-19 declaration under Section 564(b)(1) of the Act, 21 U.S.C.section 360bbb-3(b)(1), unless the authorization is terminated  or revoked sooner.       Influenza A by PCR NEGATIVE NEGATIVE Final   Influenza B by PCR NEGATIVE NEGATIVE Final    Comment: (NOTE) The Xpert Xpress SARS-CoV-2/FLU/RSV plus assay is intended as an aid in the diagnosis of influenza from Nasopharyngeal swab specimens and should not be used as a sole basis for treatment. Nasal washings and aspirates are unacceptable for Xpert Xpress SARS-CoV-2/FLU/RSV testing.  Fact Sheet for Patients: EntrepreneurPulse.com.au  Fact Sheet for Healthcare Providers: IncredibleEmployment.be  This test is not yet approved or cleared by the [place] FDA and has been authorized for detection and/or diagnosis of SARS-CoV-2 by FDA under an Emergency Use Authorization (EUA). This EUA will remain in effect (meaning this test can be used) for the duration of the COVID-19 declaration under Section 564(b)(1) of the Act, 21 U.S.C. section 360bbb-3(b)(1), unless the authorization is terminated or  revoked.  Performed at [facility], [address] [place], [place] [address]     Labs: CBC: Recent Labs  Lab 07/15/22 1558 07/16/22 0045  WBC 7.8 7.9  HGB 14.9 14.0  HCT 44.4 42.3  MCV 88.1 89.2  PLT 412* 196   Basic Metabolic Panel: Recent Labs  Lab 07/15/22 1558 07/16/22 0130  NA 128* 129*  K 3.9 3.6  CL 94* 98  CO2 23 26  GLUCOSE 90 130*  BUN 5* 5*  CREATININE 0.37* 0.34*  CALCIUM 8.8* 7.7*   Liver Function Tests: Recent Labs  Lab  07/15/22 1558  AST 17  ALT 13  ALKPHOS 43  BILITOT 0.8  PROT 6.9  ALBUMIN 3.8   CBG: No results for input(s): "GLUCAP" in the last 168 hours.  Discharge time spent: greater than 30 minutes.  Signed: Fritzi Mandes, MD [facility] 07/17/2022

## 2022-07-29 ENCOUNTER — Ambulatory Visit: Admitting: Gastroenterology

## 2022-09-11 ENCOUNTER — Encounter: Payer: Self-pay | Admitting: Student in an Organized Health Care Education/Training Program

## 2022-09-11 ENCOUNTER — Ambulatory Visit
Attending: Student in an Organized Health Care Education/Training Program | Admitting: Student in an Organized Health Care Education/Training Program

## 2022-09-11 ENCOUNTER — Other Ambulatory Visit: Payer: Self-pay

## 2022-09-11 VITALS — BP 112/74 | HR 93 | Temp 97.2°F | Resp 18 | Ht 60.0 in | Wt 105.4 lb

## 2022-09-11 DIAGNOSIS — M5134 Other intervertebral disc degeneration, thoracic region: Secondary | ICD-10-CM | POA: Insufficient documentation

## 2022-09-11 DIAGNOSIS — M7918 Myalgia, other site: Secondary | ICD-10-CM | POA: Insufficient documentation

## 2022-09-11 DIAGNOSIS — M47894 Other spondylosis, thoracic region: Secondary | ICD-10-CM | POA: Insufficient documentation

## 2022-09-11 NOTE — Progress Notes (Signed)
PROVIDER NOTE: Information contained herein reflects review and annotations entered in association with encounter. Interpretation of such information and data should be left to medically-trained personnel. Information provided to patient can be located elsewhere in the medical record under "Patient Instructions". Document created using STT-dictation technology, any transcriptional errors that may result from process are unintentional.    Patient: Colleen [name]  Service Category: E/M  Provider: Gillis Santa, MD  DOB: [birth year]  DOS: 09/11/2022  Referring Provider: Remi Haggard, FNP  MRN: [id number]  Specialty: Interventional Pain Management  PCP: Remi Haggard, FNP  Type: Established Patient  Setting: Ambulatory outpatient    Location: Office  Delivery: Face-to-face     HPI  [name], a 61 y.o. year old female, is here today because of her Thoracic facet joint syndrome [M47.894]. [name] primary complain today is Back Pain (Upper, starts at braline.) Last encounter: My last encounter with her was on 04/16/2022. Pertinent problems: [name] has Osteopenia of multiple sites and Myofascial pain syndrome on their pertinent problem list. Pain Assessment: Severity of Chronic pain is reported as a 10-Worst pain ever/10. Location: Back Mid, Upper/ . Onset: More than a month ago. Quality: Constant, Stabbing, Aching, Tingling, Tightness, Tender, Throbbing. Timing: Constant. Modifying factor(s): heat, topicals, massage, medications. Vitals:  height is 5' (1.524 m) and weight is 105 lb 6.4 oz (47.8 kg). Her temporal temperature is 97.2 F (36.2 C) (abnormal). Her blood pressure is 112/74 and her pulse is 93. Her respiration is 18 and oxygen saturation is 95%.   Reason for encounter: patient-requested evaluation.   Patient presents today with midthoracic pain.  It is most pronounced in between her shoulder blades.  There is tenderness to palpation over the spinous processes.  Since her last  visit with me, she has completed a cervical, thoracic, lumbar CT scan and MRI.  There is thoracic facet arthropathy and associated spondylosis.  We discussed thoracic facet medial branch nerve blocks for this condition.  Risk and benefits reviewed and patient would like to proceed.    ROS  Constitutional: Denies any fever or chills Gastrointestinal: No reported hemesis, hematochezia, vomiting, or acute GI distress Musculoskeletal:  Midthoracic pain Neurological: No reported episodes of acute onset apraxia, aphasia, dysarthria, agnosia, amnesia, paralysis, loss of coordination, or loss of consciousness  Medication Review  EPINEPHrine, FLUoxetine, Fluticasone-Umeclidin-Vilant, albuterol, aspirin EC, carvedilol, cholecalciferol, cyanocobalamin, denosumab, fluticasone, hydrOXYzine, ipratropium, methocarbamol, montelukast, and pantoprazole  History Review  Allergy: [name] is allergic to desvenlafaxine, morphine and related, penicillins, prazosin, tamoxifen, trazodone, clonazepam, codeine, duloxetine, gabapentin, hydroxyzine, lorazepam, paroxetine hcl, sulfa antibiotics, and cephalexin. Drug: [name]  reports that she does not currently use drugs. Alcohol:  reports no history of alcohol use. Tobacco:  reports that she has been smoking cigarettes. She has been smoking an average of .5 packs per day. She has never been exposed to tobacco smoke. She has never used smokeless tobacco. Social: [name]  reports that she has been smoking cigarettes. She has been smoking an average of .5 packs per day. She has never been exposed to tobacco smoke. She has never used smokeless tobacco. She reports that she does not currently use drugs. She reports that she does not drink alcohol. Medical:  has a past medical history of Anxiety, Asthma, Breast cancer ([place]) (2004), Chronic back pain, COPD (chronic obstructive pulmonary disease) ([place]), Depression, History of kidney surgery, Personal history of radiation  therapy, and PTSD (post-traumatic stress disorder). Surgical: [name]  has a past surgical  history that includes Nephrectomy (Left); Mastectomy (Left); Tubal ligation; Mastectomy; Breast biopsy (Left, 2004); Breast biopsy ([name]); Breast lumpectomy (Left, 2004); LEFT HEART CATH AND CORONARY ANGIOGRAPHY (N/A, 11/06/2021); Esophagogastroduodenoscopy (egd) with propofol (N/A, 06/21/2022); and Colonoscopy with propofol (N/A, 06/21/2022). Family: family history includes Alcohol abuse in her maternal grandfather; Bipolar disorder in her daughter; Breast cancer in her cousin and cousin; Drug abuse in her daughter.  Laboratory Chemistry Profile   Renal Lab Results  Component Value Date   BUN 5 (L) 07/16/2022   CREATININE 0.34 (L) 07/16/2022   GFRAA >60 03/21/2020   GFRNONAA >60 07/16/2022    Hepatic Lab Results  Component Value Date   AST 17 07/15/2022   ALT 13 07/15/2022   ALBUMIN 3.8 07/15/2022   ALKPHOS 43 07/15/2022   LIPASE 24 10/16/2020    Electrolytes Lab Results  Component Value Date   NA 129 (L) 07/16/2022   K 3.6 07/16/2022   CL 98 07/16/2022   CALCIUM 7.7 (L) 07/16/2022   MG 2.4 05/01/2022    Bone No results found for: "VD25OH", "VD125OH2TOT", "XN2355DD2", "KG2542HC6", "25OHVITD1", "25OHVITD2", "25OHVITD3", "TESTOFREE", "TESTOSTERONE"  Inflammation (CRP: Acute Phase) (ESR: Chronic Phase) Lab Results  Component Value Date   LATICACIDVEN 1.1 03/30/2022         Note: Above Lab results reviewed.  Recent Imaging Review  ECHOCARDIOGRAM COMPLETE    ECHOCARDIOGRAM REPORT       Patient Name:   Colleen [name] Date of Exam: 07/16/2022 Medical Rec #:  [id number]      Height:       60.0 in Accession #:    [id number]     Weight:       105.2 lb Date of Birth:  03/29/1961       BSA:          1.420 m Patient Age:    23 years       BP:           108/74 mmHg Patient Gender: F              HR:           96 bpm. Exam Location:  [facility]  Procedure: 2D Echo, Color Doppler and Cardiac  Doppler  Indications:     R07.9 Chest Pain   History:         Patient has prior history of Echocardiogram examinations, most                  recent 11/05/2021. COPD; Signs/Symptoms:Edema, Shortness of                  Breath and Chest Pain.   Sonographer:     Charmayne Sheer Referring Phys:  [id number] SONA PATEL Diagnosing Phys: Serafina Royals MD    Sonographer Comments: Technically difficult study due to poor echo windows. Image acquisition challenging due to COPD and inability to position pt into LLD due to extreme SOB. IMPRESSIONS   1. Left ventricular ejection fraction, by estimation, is 40 to 45%. The left ventricle has mildly decreased function. The left ventricle demonstrates global hypokinesis. Left ventricular diastolic parameters were normal.  2. [name] ventricular systolic function is normal. The [name] ventricular size is normal.  3. The mitral valve is normal in structure. Mild mitral valve regurgitation.  4. The aortic valve is normal in structure. Aortic valve regurgitation is not visualized.  FINDINGS  Left Ventricle: Left ventricular ejection fraction, by estimation, is 40 to 45%. The left ventricle has mildly decreased  function. The left ventricle demonstrates global hypokinesis. The left ventricular internal cavity size was normal in size. There is  no left ventricular hypertrophy. Left ventricular diastolic parameters were normal.  [name] Ventricle: The [name] ventricular size is normal. No increase in [name] ventricular wall thickness. [name] ventricular systolic function is normal.  Left Atrium: Left atrial size was normal in size.  [name] Atrium: [name] atrial size was normal in size.  Pericardium: There is no evidence of pericardial effusion.  Mitral Valve: The mitral valve is normal in structure. Mild mitral valve regurgitation.  Tricuspid Valve: The tricuspid valve is normal in structure. Tricuspid valve regurgitation is mild.  Aortic Valve: The aortic valve is normal in  structure. Aortic valve regurgitation is not visualized. Aortic valve mean gradient measures 3.0 mmHg. Aortic valve peak gradient measures 5.0 mmHg. Aortic valve area, by VTI measures 3.10 cm.  Pulmonic Valve: The pulmonic valve was normal in structure. Pulmonic valve regurgitation is trivial.  Aorta: The aortic root and ascending aorta are structurally normal, with no evidence of dilitation.  IAS/Shunts: No atrial level shunt detected by color flow Doppler.    LEFT VENTRICLE PLAX 2D LVIDd:         4.51 cm   Diastology LVIDs:         2.84 cm   LV e' medial:    4.90 cm/s LV PW:         0.90 cm   LV E/e' medial:  7.1 LV IVS:        0.74 cm   LV e' lateral:   4.13 cm/s LVOT diam:     2.00 cm   LV E/e' lateral: 8.4 LV SV:         43 LV SV Index:   30 LVOT Area:     3.14 cm    [name] VENTRICLE RV Basal diam:  2.81 cm  LEFT ATRIUM           Index       [name] ATRIUM          Index LA diam:      3.00 cm 2.11 cm/m  RA Area:     6.94 cm LA Vol (A4C): 10.6 ml 7.46 ml/m  RA Volume:   12.20 ml 8.59 ml/m  AORTIC VALVE                    PULMONIC VALVE AV Area (Vmax):    2.56 cm     PV Vmax:       0.80 m/s AV Area (Vmean):   2.74 cm     PV Peak grad:  2.6 mmHg AV Area (VTI):     3.10 cm AV Vmax:           112.00 cm/s AV Vmean:          72.800 cm/s AV VTI:            0.138 m AV Peak Grad:      5.0 mmHg AV Mean Grad:      3.0 mmHg LVOT Vmax:         91.40 cm/s LVOT Vmean:        63.500 cm/s LVOT VTI:          0.136 m LVOT/AV VTI ratio: 0.99   AORTA Ao Root diam: 3.10 cm  MITRAL VALVE MV Area (PHT): 6.90 cm    SHUNTS MV Decel Time: 110 msec    Systemic VTI:  0.14 m MV E velocity:  34.70 cm/s  Systemic Diam: 2.00 cm MV A velocity: 61.70 cm/s MV E/A ratio:  0.56  Serafina Royals MD Electronically signed by Serafina Royals MD Signature Date/Time: 07/17/2022/12:41:16 PM      Final   Note: Reviewed        Physical Exam  General appearance: Well nourished, well developed, and  well hydrated. In no apparent acute distress Mental status: Alert, oriented x 3 (person, place, & time)       Respiratory: No evidence of acute respiratory distress Eyes: PERLA Vitals: BP 112/74   Pulse 93   Temp (!) 97.2 F (36.2 C) (Temporal)   Resp 18   Ht 5' (1.524 m)   Wt 105 lb 6.4 oz (47.8 kg)   SpO2 95%   BMI 20.58 kg/m  BMI: Estimated body mass index is 20.58 kg/m as calculated from the following:   Height as of this encounter: 5' (1.524 m).   Weight as of this encounter: 105 lb 6.4 oz (47.8 kg). Ideal: Ideal body weight: 45.5 kg (100 lb 4.9 oz) Adjusted ideal body weight: 46.4 kg (102 lb 5.5 oz)  Thoracic Spine Area Exam  Skin & Axial Inspection: No masses, redness, or swelling Alignment: Symmetrical Functional ROM: Pain restricted ROM Stability: No instability detected Muscle Tone/Strength: Functionally intact. No obvious neuro-muscular anomalies detected. Sensory (Neurological): Musculoskeletal pain pattern, facet mediated Muscle strength & Tone: Complains of area being tender to palpation  Assessment   Diagnosis Status  1. Thoracic facet joint syndrome   2. Thoracic degenerative disc disease   3. Myofascial pain syndrome    Increased pain Persistent Controlled   Updated Problems: Problem  Myofascial Pain Syndrome  Thoracic Facet Joint Syndrome  Thoracic Degenerative Disc Disease     Plan of Care   Colleen [name] has a history of greater than 3 months of moderate to severe thoracic spine pain which is resulted in functional impairment.  The patient has tried various conservative therapeutic options such as NSAIDs, Tylenol, muscle relaxants, physician directed home physical therapy which was inadequately effective.  Patient's pain is predominantly axial with physical exam and MRI findings suggestive of  thoracic facet arthropathy. Thoracic facet medial branch nerve blocks were discussed with the patient.  Risks and benefits were reviewed.  Patient would  like to proceed with bilateral thoracic medial branch nerve blocks.    Orders:  Orders Placed This Encounter  Procedures   THORACIC FACET BLOCK    Standing Status:   Future    Standing Expiration Date:   12/12/2022    Scheduling Instructions:     Thoracic Medial Branch Block     Side: Bilateral     Sedation: without     Timeframe: ASAA    Order Specific Question:   Where will this procedure be performed?    Answer:   [facility]   Follow-up plan:   Return in about 6 days (around 09/17/2022) for B/L Thoracic facet , in clinic [facility].    Recent Visits No visits were found meeting these conditions. Showing recent visits within past 90 days and meeting all other requirements Today's Visits Date Type Provider Dept  09/11/22 Office Visit Gillis Santa, MD [facility]  Showing today's visits and meeting all other requirements Future Appointments No visits were found meeting these conditions. Showing future appointments within next 90 days and meeting all other requirements  I discussed the assessment and treatment plan with the patient. The patient was provided an opportunity to ask questions and  all were answered. The patient agreed with the plan and demonstrated an understanding of the instructions.  Patient advised to call back or seek an in-person evaluation if the symptoms or condition worsens.  Duration of encounter: 56mnutes.  Total time on encounter, as per AMA guidelines included both the face-to-face and non-face-to-face time personally spent by the physician and/or other qualified health care professional(s) on the day of the encounter (includes time in activities that require the physician or other qualified health care professional and does not include time in activities normally performed by clinical staff). Physician's time may include the following activities when performed: preparing to see the patient (eg, review of tests, pre-charting review of  records) obtaining and/or reviewing separately obtained history performing a medically appropriate examination and/or evaluation counseling and educating the patient/family/caregiver ordering medications, tests, or procedures referring and communicating with other health care professionals (when not separately reported) documenting clinical information in the electronic or other health record independently interpreting results (not separately reported) and communicating results to the patient/ family/caregiver care coordination (not separately reported)  Note by: BGillis Santa MD Date: 09/11/2022; Time: 11:43 AM

## 2022-09-11 NOTE — Patient Instructions (Signed)
____________________________________________________________________________________________  General Risks and Possible Complications  Patient Responsibilities: It is important that you read this as it is part of your informed consent. It is our duty to inform you of the risks and possible complications associated with treatments offered to you. It is your responsibility as a patient to read this and to ask questions about anything that is not clear or that you believe was not covered in this document.  Patient's Rights: You have the right to refuse treatment. You also have the right to change your mind, even after initially having agreed to have the treatment done. However, under this last option, if you wait until the last second to change your mind, you may be charged for the materials used up to that point.  Introduction: Medicine is not an exact science. Everything in Medicine, including the lack of treatment(s), carries the potential for danger, harm, or loss (which is by definition: Risk). In Medicine, a complication is a secondary problem, condition, or disease that can aggravate an already existing one. All treatments carry the risk of possible complications. The fact that a side effects or complications occurs, does not imply that the treatment was conducted incorrectly. It must be clearly understood that these can happen even when everything is done following the highest safety standards.  No treatment: You can choose not to proceed with the proposed treatment alternative. The "PRO(s)" would include: avoiding the risk of complications associated with the therapy. The "CON(s)" would include: not getting any of the treatment benefits. These benefits fall under one of three categories: diagnostic; therapeutic; and/or palliative. Diagnostic benefits include: getting information which can ultimately lead to improvement of the disease or symptom(s). Therapeutic benefits are those associated with the  successful treatment of the disease. Finally, palliative benefits are those related to the decrease of the primary symptoms, without necessarily curing the condition (example: decreasing the pain from a flare-up of a chronic condition, such as incurable terminal cancer).  General Risks and Complications: These are associated to most interventional treatments. They can occur alone, or in combination. They fall under one of the following six (6) categories: no benefit or worsening of symptoms; bleeding; infection; nerve damage; allergic reactions; and/or death. No benefits or worsening of symptoms: In Medicine there are no guarantees, only probabilities. No healthcare provider can ever guarantee that a medical treatment will work, they can only state the probability that it may. Furthermore, there is always the possibility that the condition may worsen, either directly, or indirectly, as a consequence of the treatment. Bleeding: This is more common if the patient is taking a blood thinner, either prescription or over the counter (example: Goody Powders, Fish oil, Aspirin, Garlic, etc.), or if suffering a condition associated with impaired coagulation (example: Hemophilia, cirrhosis of the liver, low platelet counts, etc.). However, even if you do not have one on these, it can still happen. If you have any of these conditions, or take one of these drugs, make sure to notify your treating physician. Infection: This is more common in patients with a compromised immune system, either due to disease (example: diabetes, cancer, human immunodeficiency virus [HIV], etc.), or due to medications or treatments (example: therapies used to treat cancer and rheumatological diseases). However, even if you do not have one on these, it can still happen. If you have any of these conditions, or take one of these drugs, make sure to notify your treating physician. Nerve Damage: This is more common when the treatment is an invasive    one, but it can also happen with the use of medications, such as those used in the treatment of cancer. The damage can occur to small secondary nerves, or to large primary ones, such as those in the spinal cord and brain. This damage may be temporary or permanent and it may lead to impairments that can range from temporary numbness to permanent paralysis and/or brain death. Allergic Reactions: Any time a substance or material comes in contact with our body, there is the possibility of an allergic reaction. These can range from a mild skin rash (contact dermatitis) to a severe systemic reaction (anaphylactic reaction), which can result in death. Death: In general, any medical intervention can result in death, most of the time due to an unforeseen complication. ____________________________________________________________________________________________ Facet Blocks Patient Information  Description: The facets are joints in the spine between the vertebrae.  Like any joints in the body, facets can become irritated and painful.  Arthritis can also effect the facets.  By injecting steroids and local anesthetic in and around these joints, we can temporarily block the nerve supply to them.  Steroids act directly on irritated nerves and tissues to reduce selling and inflammation which often leads to decreased pain.  Facet blocks may be done anywhere along the spine from the neck to the low back depending upon the location of your pain.   After numbing the skin with local anesthetic (like Novocaine), a small needle is passed onto the facet joints under x-ray guidance.  You may experience a sensation of pressure while this is being done.  The entire block usually lasts about 15-25 minutes.   Conditions which may be treated by facet blocks:  Low back/buttock pain Neck/shoulder pain Certain types of headaches  Preparation for the injection:  Do not eat any solid food or dairy products within 8 hours of your  appointment. You may drink clear liquid up to 3 hours before appointment.  Clear liquids include water, black coffee, juice or soda.  No milk or cream please. You may take your regular medication, including pain medications, with a sip of water before your appointment.  Diabetics should hold regular insulin (if taken separately) and take 1/2 normal NPH dose the morning of the procedure.  Carry some sugar containing items with you to your appointment. A driver must accompany you and be prepared to drive you home after your procedure. Bring all your current medications with you. An IV may be inserted and sedation may be given at the discretion of the physician. A blood pressure cuff, EKG and other monitors will often be applied during the procedure.  Some patients may need to have extra oxygen administered for a short period. You will be asked to provide medical information, including your allergies and medications, prior to the procedure.  We must know immediately if you are taking blood thinners (like Coumadin/Warfarin) or if you are allergic to IV iodine contrast (dye).  We must know if you could possible be pregnant.  Possible side-effects:  Bleeding from needle site Infection (rare, may require surgery) Nerve injury (rare) Numbness & tingling (temporary) Difficulty urinating (rare, temporary) Spinal headache (a headache worse with upright posture) Light-headedness (temporary) Pain at injection site (serveral days) Decreased blood pressure (rare, temporary) Weakness in arm/leg (temporary) Pressure sensation in back/neck (temporary)   Call if you experience:  Fever/chills associated with headache or increased back/neck pain Headache worsened by an upright position New onset, weakness or numbness of an extremity below the injection site Hives or  difficulty breathing (go to the emergency room) Inflammation or drainage at the injection site(s) Severe back/neck pain greater than  usual New symptoms which are concerning to you  Please note:  Although the local anesthetic injected can often make your back or neck feel good for several hours after the injection, the pain will likely return. It takes 3-7 days for steroids to work.  You may not notice any pain relief for at least one week.  If effective, we will often do a series of 2-3 injections spaced 3-6 weeks apart to maximally decrease your pain.  After the initial series, you may be a candidate for a more permanent nerve block of the facets.  If you have any questions, please call #[phone]) [facility]

## 2022-09-11 NOTE — Progress Notes (Signed)
Safety precautions to be maintained throughout the outpatient stay will include: orient to surroundings, keep bed in low position, maintain call bell within reach at all times, provide assistance with transfer out of bed and ambulation.  

## 2022-09-29 ENCOUNTER — Telehealth: Admitting: Physician Assistant

## 2022-09-29 DIAGNOSIS — J019 Acute sinusitis, unspecified: Secondary | ICD-10-CM

## 2022-09-29 DIAGNOSIS — B9689 Other specified bacterial agents as the cause of diseases classified elsewhere: Secondary | ICD-10-CM

## 2022-09-29 MED ORDER — DOXYCYCLINE HYCLATE 100 MG PO TABS: 100.0000 mg | ORAL_TABLET | Freq: Two times a day (BID) | ORAL | 0 refills | Status: DC

## 2022-09-29 MED ORDER — IPRATROPIUM BROMIDE 0.03 % NA SOLN: 2.0000 | Freq: Two times a day (BID) | NASAL | 0 refills | Status: DC

## 2022-09-29 NOTE — Progress Notes (Signed)
Virtual Visit Consent   Colleen [name], you are scheduled for a virtual visit with a [facility] provider today. Just as with appointments in the office, your consent must be obtained to participate. Your consent will be active for this visit and any virtual visit you may have with one of our providers in the next 365 days. If you have a MyChart account, a copy of this consent can be sent to you electronically.  As this is a virtual visit, video technology does not allow for your provider to perform a traditional examination. This may limit your provider's ability to fully assess your condition. If your provider identifies any concerns that need to be evaluated in person or the need to arrange testing (such as labs, EKG, etc.), we will make arrangements to do so. Although advances in technology are sophisticated, we cannot ensure that it will always work on either your end or our end. If the connection with a video visit is poor, the visit may have to be switched to a telephone visit. With either a video or telephone visit, we are not always able to ensure that we have a secure connection.  By engaging in this virtual visit, you consent to the provision of healthcare and authorize for your insurance to be billed (if applicable) for the services provided during this visit. Depending on your insurance coverage, you may receive a charge related to this service.  I need to obtain your verbal consent now. Are you willing to proceed with your visit today? Colleen [name] has provided verbal consent on 09/29/2022 for a virtual visit (video or telephone). Mar Daring, PA-C  Date: 09/29/2022 8:54 AM  Virtual Visit via Video Note   I, Mar Daring, connected with  Colleen [name]  ([id number], 62-Jan-1962) on 09/29/22 at  9:30 AM EST by a video-enabled telemedicine application and verified that I am speaking with the correct person using two identifiers.  Location: Patient: Virtual Visit Location  Patient: Home Provider: Virtual Visit Location Provider: Home Office   I discussed the limitations of evaluation and management by telemedicine and the availability of in person appointments. The patient expressed understanding and agreed to proceed.    History of Present Illness: Colleen [name] is a 61 y.o. who identifies as a female who was assigned female at birth, and is being seen today for possible sinus infection.  HPI: Sinusitis This is a new problem. The current episode started 1 to 4 weeks ago. The problem has been gradually worsening since onset. There has been no fever. Associated symptoms include congestion, coughing, headaches and sinus pressure. Pertinent negatives include no chills, ear pain, hoarse voice or sore throat. (Rhinorrhea and post nasal drainage) Treatments tried: mucinex, tylenol. The treatment provided no relief.     Problems:  Patient Active Problem List   Diagnosis Date Noted   Thoracic facet joint syndrome 09/11/2022   Thoracic degenerative disc disease 09/11/2022   Chest pain 07/16/2022   Protein-calorie malnutrition, moderate ([place]) 06/21/2022   Chronic diarrhea of unknown origin    Loss of weight    Gastric erosion    Dysphagia 06/20/2022   Right buttock pain 06/19/2022   Right-sided chest pain 06/18/2022   Coronary artery disease 03/10/2022   Dependence on nocturnal oxygen therapy 03/10/2022   At risk for prolonged QT interval syndrome 11/19/2021   Tobacco use disorder 06/19/2021   PTSD (post-traumatic stress disorder) 03/04/2021   Myofascial pain syndrome 07/09/2020   Chronic pain syndrome 06/04/2020   Osteopenia  of multiple sites 06/04/2020   Age related osteoporosis 06/04/2020   Low back pain 04/27/2020   Panic attack 03/20/2020   COPD (chronic obstructive pulmonary disease) ([place]) 10/11/2018   Generalized anxiety disorder 10/11/2018    Allergies:  Allergies  Allergen Reactions   Desvenlafaxine Anaphylaxis   Morphine And Related  Anaphylaxis   Penicillins Anaphylaxis    Has patient had a PCN reaction causing immediate rash, facial/tongue/throat swelling, SOB or lightheadedness with hypotension: Yes Has patient had a PCN reaction causing severe rash involving mucus membranes or skin necrosis: No Has patient had a PCN reaction that required hospitalization: Unknown Has patient had a PCN reaction occurring within the last 10 years: No If all of the above answers are "NO", then may proceed with Cephalosporin use.    Prazosin Other (See Comments)   Tamoxifen Anaphylaxis   Trazodone Anaphylaxis   Clonazepam    Codeine Swelling   Duloxetine Itching   Gabapentin Hives    Rash and swelling.    Hydroxyzine Itching   Lorazepam     Patient feels this medications makes her to sedated and wants to avoid   Paroxetine Hcl Hives   Sulfa Antibiotics Itching   Cephalexin Rash   Medications:  Current Outpatient Medications:    doxycycline (VIBRA-TABS) 100 MG tablet, Take 1 tablet (100 mg total) by mouth 2 (two) times daily., Disp: 20 tablet, Rfl: 0   ipratropium (ATROVENT) 0.03 % nasal spray, Place 2 sprays into both nostrils every 12 (twelve) hours., Disp: 30 mL, Rfl: 0   albuterol (VENTOLIN HFA) 108 (90 Base) MCG/ACT inhaler, Inhale 1-2 puffs into the lungs every 4 (four) hours as needed., Disp: 8 g, Rfl: 2   aspirin EC 81 MG tablet, Take 1 tablet (81 mg total) by mouth daily. Swallow whole., Disp: 30 tablet, Rfl: 12   carvedilol (COREG) 3.125 MG tablet, Take 1 tablet (3.125 mg total) by mouth 2 (two) times daily with a meal., Disp: 60 tablet, Rfl: 0   cholecalciferol (VITAMIN D3) 25 MCG (1000 UNIT) tablet, Take 1,000 Units by mouth daily., Disp: , Rfl:    denosumab (PROLIA) 60 MG/ML SOSY injection, Inject 60 mg into the skin every 6 (six) months., Disp: , Rfl:    EPINEPHrine 0.3 mg/0.3 mL IJ SOAJ injection, Inject 0.3 mg into the muscle as needed for anaphylaxis., Disp: , Rfl:    FLUoxetine (PROZAC) 40 MG capsule, Take 1  capsule (40 mg total) by mouth daily., Disp: 30 capsule, Rfl: 3   fluticasone (FLONASE) 50 MCG/ACT nasal spray, Place 1 spray into both nostrils 2 (two) times daily., Disp: , Rfl:    Fluticasone-Umeclidin-Vilant (TRELEGY ELLIPTA) 100-62.5-25 MCG/INH AEPB, Inhale 1 puff into the lungs daily., Disp: , Rfl:    hydrOXYzine (ATARAX) 50 MG tablet, Take 1 tablet (50 mg total) by mouth 3 (three) times daily as needed., Disp: 30 tablet, Rfl: 0   ipratropium (ATROVENT) 0.02 % nebulizer solution, Take 0.5 mg by nebulization 4 (four) times daily., Disp: , Rfl:    methocarbamol (ROBAXIN) 750 MG tablet, Take 1 tablet (750 mg total) by mouth 4 (four) times daily., Disp: 120 tablet, Rfl: 0   montelukast (SINGULAIR) 10 MG tablet, Take 10 mg by mouth daily., Disp: , Rfl:    pantoprazole (PROTONIX) 40 MG tablet, Take 1 tablet (40 mg total) by mouth 2 (two) times daily before a meal., Disp: 60 tablet, Rfl: 0   vitamin B-12 (CYANOCOBALAMIN) 500 MCG tablet, Take 500 mcg by mouth daily., Disp: , Rfl:  Observations/Objective: Patient is well-developed, well-nourished in no acute distress.  Resting comfortably at home.  Head is normocephalic, atraumatic.  No labored breathing.  Speech is clear and coherent with logical content.  Patient is alert and oriented at baseline.    Assessment and Plan: 1. Acute bacterial sinusitis - doxycycline (VIBRA-TABS) 100 MG tablet; Take 1 tablet (100 mg total) by mouth 2 (two) times daily.  Dispense: 20 tablet; Refill: 0 - ipratropium (ATROVENT) 0.03 % nasal spray; Place 2 sprays into both nostrils every 12 (twelve) hours.  Dispense: 30 mL; Refill: 0  - Worsening symptoms that have not responded to OTC medications.  - Will give Doxycycline and Ipratropium bromide nasal spray - Continue allergy medications.  - Steam and humidifier can help - Stay well hydrated and get plenty of rest.  - Seek in person evaluation if no symptom improvement or if symptoms worsen   Follow Up  Instructions: I discussed the assessment and treatment plan with the patient. The patient was provided an opportunity to ask questions and all were answered. The patient agreed with the plan and demonstrated an understanding of the instructions.  A copy of instructions were sent to the patient via MyChart unless otherwise noted below.     The patient was advised to call back or seek an in-person evaluation if the symptoms worsen or if the condition fails to improve as anticipated.  Time:  I spent 8 minutes with the patient via telehealth technology discussing the above problems/concerns.    Mar Daring, PA-C

## 2022-09-29 NOTE — Patient Instructions (Signed)
[name], thank you for joining Mar Daring, PA-C for today's virtual visit.  While this provider is not your primary care provider (PCP), if your PCP is located in our provider database this encounter information will be shared with them immediately following your visit.   [name] account gives you access to today's visit and all your visits, tests, and labs performed at [facility] " click here if you don't have a [facility] account or go to [email or web address]  Consent: (Patient) Colleen [name] provided verbal consent for this virtual visit at the beginning of the encounter.  Current Medications:  Current Outpatient Medications:    doxycycline (VIBRA-TABS) 100 MG tablet, Take 1 tablet (100 mg total) by mouth 2 (two) times daily., Disp: 20 tablet, Rfl: 0   ipratropium (ATROVENT) 0.03 % nasal spray, Place 2 sprays into both nostrils every 12 (twelve) hours., Disp: 30 mL, Rfl: 0   albuterol (VENTOLIN HFA) 108 (90 Base) MCG/ACT inhaler, Inhale 1-2 puffs into the lungs every 4 (four) hours as needed., Disp: 8 g, Rfl: 2   aspirin EC 81 MG tablet, Take 1 tablet (81 mg total) by mouth daily. Swallow whole., Disp: 30 tablet, Rfl: 12   carvedilol (COREG) 3.125 MG tablet, Take 1 tablet (3.125 mg total) by mouth 2 (two) times daily with a meal., Disp: 60 tablet, Rfl: 0   cholecalciferol (VITAMIN D3) 25 MCG (1000 UNIT) tablet, Take 1,000 Units by mouth daily., Disp: , Rfl:    denosumab (PROLIA) 60 MG/ML SOSY injection, Inject 60 mg into the skin every 6 (six) months., Disp: , Rfl:    EPINEPHrine 0.3 mg/0.3 mL IJ SOAJ injection, Inject 0.3 mg into the muscle as needed for anaphylaxis., Disp: , Rfl:    FLUoxetine (PROZAC) 40 MG capsule, Take 1 capsule (40 mg total) by mouth daily., Disp: 30 capsule, Rfl: 3   fluticasone (FLONASE) 50 MCG/ACT nasal spray, Place 1 spray into both nostrils 2 (two) times daily., Disp: , Rfl:    Fluticasone-Umeclidin-Vilant  (TRELEGY ELLIPTA) 100-62.5-25 MCG/INH AEPB, Inhale 1 puff into the lungs daily., Disp: , Rfl:    hydrOXYzine (ATARAX) 50 MG tablet, Take 1 tablet (50 mg total) by mouth 3 (three) times daily as needed., Disp: 30 tablet, Rfl: 0   ipratropium (ATROVENT) 0.02 % nebulizer solution, Take 0.5 mg by nebulization 4 (four) times daily., Disp: , Rfl:    methocarbamol (ROBAXIN) 750 MG tablet, Take 1 tablet (750 mg total) by mouth 4 (four) times daily., Disp: 120 tablet, Rfl: 0   montelukast (SINGULAIR) 10 MG tablet, Take 10 mg by mouth daily., Disp: , Rfl:    pantoprazole (PROTONIX) 40 MG tablet, Take 1 tablet (40 mg total) by mouth 2 (two) times daily before a meal., Disp: 60 tablet, Rfl: 0   vitamin B-12 (CYANOCOBALAMIN) 500 MCG tablet, Take 500 mcg by mouth daily., Disp: , Rfl:    Medications ordered in this encounter:  Meds ordered this encounter  Medications   doxycycline (VIBRA-TABS) 100 MG tablet    Sig: Take 1 tablet (100 mg total) by mouth 2 (two) times daily.    Dispense:  20 tablet    Refill:  0    Order Specific Question:   Supervising Provider    Answer:   Chase Picket [[id number]]   ipratropium (ATROVENT) 0.03 % nasal spray    Sig: Place 2 sprays into both nostrils every 12 (twelve) hours.    Dispense:  30 mL    Refill:  0    Order Specific Question:   Supervising Provider    Answer:   Chase Picket [[id number]]     *If you need refills on other medications prior to your next appointment, please contact your pharmacy*  Follow-Up: Call back or seek an in-person evaluation if the symptoms worsen or if the condition fails to improve as anticipated.  [place] [phone]  Other Instructions  Sinus Infection, Adult A sinus infection, also called sinusitis, is inflammation of your sinuses. Sinuses are hollow spaces in the bones around your face. Your sinuses are located: Around your eyes. In the middle of your forehead. Behind your nose. In your  cheekbones. Mucus normally drains out of your sinuses. When your nasal tissues become inflamed or swollen, mucus can become trapped or blocked. This allows bacteria, viruses, and fungi to grow, which leads to infection. Most infections of the sinuses are caused by a virus. A sinus infection can develop quickly. It can last for up to 4 weeks (acute) or for more than 12 weeks (chronic). A sinus infection often develops after a cold. What are the causes? This condition is caused by anything that creates swelling in the sinuses or stops mucus from draining. This includes: Allergies. Asthma. Infection from bacteria or viruses. Deformities or blockages in your nose or sinuses. Abnormal growths in the nose (nasal polyps). Pollutants, such as chemicals or irritants in the air. Infection from fungi. This is rare. What increases the risk? You are more likely to develop this condition if you: Have a weak body defense system (immune system). Do a lot of swimming or diving. Overuse nasal sprays. Smoke. What are the signs or symptoms? The main symptoms of this condition are pain and a feeling of pressure around the affected sinuses. Other symptoms include: Stuffy nose or congestion that makes it difficult to breathe through your nose. Thick yellow or greenish drainage from your nose. Tenderness, swelling, and warmth over the affected sinuses. A cough that may get worse at night. Decreased sense of smell and taste. Extra mucus that collects in the throat or the back of the nose (postnasal drip) causing a sore throat or bad breath. Tiredness (fatigue). Fever. How is this diagnosed? This condition is diagnosed based on: Your symptoms. Your medical history. A physical exam. Tests to find out if your condition is acute or chronic. This may include: Checking your nose for nasal polyps. Viewing your sinuses using a device that has a light (endoscope). Testing for allergies or bacteria. Imaging tests,  such as an MRI or CT scan. In rare cases, a bone biopsy may be done to rule out more serious types of fungal sinus disease. How is this treated? Treatment for a sinus infection depends on the cause and whether your condition is chronic or acute. If caused by a virus, your symptoms should go away on their own within 10 days. You may be given medicines to relieve symptoms. They include: Medicines that shrink swollen nasal passages (decongestants). A spray that eases inflammation of the nostrils (topical intranasal corticosteroids). Rinses that help get rid of thick mucus in your nose (nasal saline washes). Medicines that treat allergies (antihistamines). Over-the-counter pain relievers. If caused by bacteria, your health care provider may recommend waiting to see if your symptoms improve. Most bacterial infections will get better without antibiotic medicine. You may be given antibiotics if you have: A severe infection. A weak immune system. If caused by narrow nasal passages or nasal polyps, surgery may  be needed. Follow these instructions at home: Medicines Take, use, or apply over-the-counter and prescription medicines only as told by your health care provider. These may include nasal sprays. If you were prescribed an antibiotic medicine, take it as told by your health care provider. Do not stop taking the antibiotic even if you start to feel better. Hydrate and humidify  Drink enough fluid to keep your urine pale yellow. Staying hydrated will help to thin your mucus. Use a cool mist humidifier to keep the humidity level in your home above 50%. Inhale steam for 10-15 minutes, 3-4 times a day, or as told by your health care provider. You can do this in the bathroom while a hot shower is running. Limit your exposure to cool or dry air. Rest Rest as much as possible. Sleep with your head raised (elevated). Make sure you get enough sleep each night. General instructions  Apply a warm, moist  washcloth to your face 3-4 times a day or as told by your health care provider. This will help with discomfort. Use nasal saline washes as often as told by your health care provider. Wash your hands often with soap and water to reduce your exposure to germs. If soap and water are not available, use hand sanitizer. Do not smoke. Avoid being around people who are smoking (secondhand smoke). Keep all follow-up visits. This is important. Contact a health care provider if: You have a fever. Your symptoms get worse. Your symptoms do not improve within 10 days. Get help right away if: You have a severe headache. You have persistent vomiting. You have severe pain or swelling around your face or eyes. You have vision problems. You develop confusion. Your neck is stiff. You have trouble breathing. These symptoms may be an emergency. Get help right away. Call 911. Do not wait to see if the symptoms will go away. Do not drive yourself to the hospital. Summary A sinus infection is soreness and inflammation of your sinuses. Sinuses are hollow spaces in the bones around your face. This condition is caused by nasal tissues that become inflamed or swollen. The swelling traps or blocks the flow of mucus. This allows bacteria, viruses, and fungi to grow, which leads to infection. If you were prescribed an antibiotic medicine, take it as told by your health care provider. Do not stop taking the antibiotic even if you start to feel better. Keep all follow-up visits. This is important. This information is not intended to replace advice given to you by your health care provider. Make sure you discuss any questions you have with your health care provider. Document Revised: 10/01/2021 Document Reviewed: 10/01/2021 Elsevier Patient Education  [place].    If you have been instructed to have an in-person evaluation today at a local Urgent Care facility, please use the link below. It will take you to a  list of all of our available [facility], including address, phone number and hours of operation. Please do not delay care.  [facility]  If you or a family member do not have a primary care provider, use the link below to schedule a visit and establish care. When you choose a [facility] primary care physician or advanced practice provider, you gain a long-term partner in health. Find a Primary Care Provider  Learn more about [facility]'s in-office and virtual care options: [facility]

## 2022-10-01 ENCOUNTER — Ambulatory Visit
Attending: Student in an Organized Health Care Education/Training Program | Admitting: Student in an Organized Health Care Education/Training Program

## 2022-10-01 ENCOUNTER — Ambulatory Visit
Admission: RE | Admit: 2022-10-01 | Discharge: 2022-10-01 | Disposition: A | Discharge status: Home / Self Care | Source: Ambulatory Visit | Attending: Student in an Organized Health Care Education/Training Program | Admitting: Student in an Organized Health Care Education/Training Program

## 2022-10-01 ENCOUNTER — Encounter: Payer: Self-pay | Admitting: Student in an Organized Health Care Education/Training Program

## 2022-10-01 VITALS — BP 192/98 | HR 91 | Temp 97.8°F | Resp 21 | Ht 60.0 in | Wt 105.0 lb

## 2022-10-01 DIAGNOSIS — M47894 Other spondylosis, thoracic region: Secondary | ICD-10-CM | POA: Insufficient documentation

## 2022-10-01 DIAGNOSIS — M5134 Other intervertebral disc degeneration, thoracic region: Secondary | ICD-10-CM | POA: Insufficient documentation

## 2022-10-01 MED ORDER — ROPIVACAINE HCL 2 MG/ML IJ SOLN
9.0000 mL | Freq: Once | INTRAMUSCULAR | Status: AC
Start: 2022-10-01 — End: 2022-10-01
  Administered 2022-10-01: 9 mL via PERINEURAL

## 2022-10-01 MED ORDER — DEXAMETHASONE SODIUM PHOSPHATE 10 MG/ML IJ SOLN
10.0000 mg | Freq: Once | INTRAMUSCULAR | Status: AC
Start: 2022-10-01 — End: 2022-10-01
  Administered 2022-10-01: 10 mg

## 2022-10-01 MED ORDER — LIDOCAINE HCL 2 % IJ SOLN
20.0000 mL | Freq: Once | INTRAMUSCULAR | Status: AC
Start: 2022-10-01 — End: 2022-10-01
  Administered 2022-10-01: 400 mg

## 2022-10-01 MED ORDER — ROPIVACAINE HCL 2 MG/ML IJ SOLN
INTRAMUSCULAR | Status: AC
  Filled 2022-10-01: qty 20

## 2022-10-01 MED ORDER — DEXAMETHASONE SODIUM PHOSPHATE 10 MG/ML IJ SOLN
INTRAMUSCULAR | Status: AC
  Filled 2022-10-01: qty 2

## 2022-10-01 MED ORDER — LIDOCAINE HCL 2 % IJ SOLN
INTRAMUSCULAR | Status: AC
  Filled 2022-10-01: qty 20

## 2022-10-01 NOTE — Progress Notes (Signed)
Safety precautions to be maintained throughout the outpatient stay will include: orient to surroundings, keep bed in low position, maintain call bell within reach at all times, provide assistance with transfer out of bed and ambulation.  

## 2022-10-01 NOTE — Patient Instructions (Signed)
Pain Management Discharge Instructions  General Discharge Instructions :  If you need to reach your doctor call: Monday-Friday 8:00 am - 4:00 pm at [phone] or toll free [phone].  After clinic hours [phone] to have operator reach doctor.  Bring all of your medication bottles to all your appointments in the pain clinic.  To cancel or reschedule your appointment with Pain Management please remember to call 24 hours in advance to avoid a fee.  Refer to the educational materials which you have been given on: General Risks, I had my Procedure. Discharge Instructions, Post Sedation.  Post Procedure Instructions:  The drugs you were given will stay in your system until tomorrow, so for the next 24 hours you should not drive, make any legal decisions or drink any alcoholic beverages.  You may eat anything you prefer, but it is better to start with liquids then soups and crackers, and gradually work up to solid foods.  Please notify your doctor immediately if you have any unusual bleeding, trouble breathing or pain that is not related to your normal pain.  Depending on the type of procedure that was done, some parts of your body may feel week and/or numb.  This usually clears up by tonight or the next day.  Walk with the use of an assistive device or accompanied by an adult for the 24 hours.  You may use ice on the affected area for the first 24 hours.  Put ice in a Ziploc bag and cover with a towel and place against area 15 minutes on 15 minutes off.  You may switch to heat after 24 hours.Facet Blocks Patient Information  Description: The facets are joints in the spine between the vertebrae.  Like any joints in the body, facets can become irritated and painful.  Arthritis can also effect the facets.  By injecting steroids and local anesthetic in and around these joints, we can temporarily block the nerve supply to them.  Steroids act directly on irritated nerves and tissues to  reduce selling and inflammation which often leads to decreased pain.  Facet blocks may be done anywhere along the spine from the neck to the low back depending upon the location of your pain.   After numbing the skin with local anesthetic (like Novocaine), a small needle is passed onto the facet joints under x-ray guidance.  You may experience a sensation of pressure while this is being done.  The entire block usually lasts about 15-25 minutes.   Conditions which may be treated by facet blocks:  Low back/buttock pain Neck/shoulder pain Certain types of headaches  Preparation for the injection:  Do not eat any solid food or dairy products within 8 hours of your appointment. You may drink clear liquid up to 3 hours before appointment.  Clear liquids include water, black coffee, juice or soda.  No milk or cream please. You may take your regular medication, including pain medications, with a sip of water before your appointment.  Diabetics should hold regular insulin (if taken separately) and take 1/2 normal NPH dose the morning of the procedure.  Carry some sugar containing items with you to your appointment. A driver must accompany you and be prepared to drive you home after your procedure. Bring all your current medications with you. An IV may be inserted and sedation may be given at the discretion of the physician. A blood pressure cuff, EKG and other monitors will often be applied during the procedure.  Some patients may need to   have extra oxygen administered for a short period. You will be asked to provide medical information, including your allergies and medications, prior to the procedure.  We must know immediately if you are taking blood thinners (like Coumadin/Warfarin) or if you are allergic to IV iodine contrast (dye).  We must know if you could possible be pregnant.  Possible side-effects:  Bleeding from needle site Infection (rare, may require surgery) Nerve injury (rare) Numbness  & tingling (temporary) Difficulty urinating (rare, temporary) Spinal headache (a headache worse with upright posture) Light-headedness (temporary) Pain at injection site (serveral days) Decreased blood pressure (rare, temporary) Weakness in arm/leg (temporary) Pressure sensation in back/neck (temporary)   Call if you experience:  Fever/chills associated with headache or increased back/neck pain Headache worsened by an upright position New onset, weakness or numbness of an extremity below the injection site Hives or difficulty breathing (go to the emergency room) Inflammation or drainage at the injection site(s) Severe back/neck pain greater than usual New symptoms which are concerning to you  Please note:  Although the local anesthetic injected can often make your back or neck feel good for several hours after the injection, the pain will likely return. It takes 3-7 days for steroids to work.  You may not notice any pain relief for at least one week.  If effective, we will often do a series of 2-3 injections spaced 3-6 weeks apart to maximally decrease your pain.  After the initial series, you may be a candidate for a more permanent nerve block of the facets.  If you have any questions, please call [phone]  [facility] 

## 2022-10-01 NOTE — Progress Notes (Signed)
PROVIDER NOTE: Interpretation of information contained herein should be left to medically-trained personnel. Specific patient instructions are provided elsewhere under "Patient Instructions" section of medical record. This document was created in part using STT-dictation technology, any transcriptional errors that may result from this process are unintentional.  Patient: Colleen [name] Type: Established DOB: 09-11-1961 MRN: [id number] PCP: Remi Haggard, FNP  Service: Procedure DOS: 10/01/2022 Setting: Ambulatory Location: Ambulatory outpatient facility Delivery: Face-to-face Provider: Gillis Santa, MD Specialty: Interventional Pain Management Specialty designation: 09 Location: Outpatient facility Ref. Prov.: Remi Haggard, FNP    Primary Reason for Visit: Interventional Pain Management Treatment. CC: Back Pain   Procedure #1:    [Reference CPT: 64461(single); 64462(additional levels)]   Type: Thoracic facet medial branch block #1  Laterality: Bilateral (-50)  Level: T4, T5, T6, and T7 Medial Branch Level(s)  Imaging: Fluoroscopy-guided         Anesthesia: Local anesthesia (1-2% Lidocaine) DOS: 10/01/2022  Performed by: Gillis Santa, MD  Purpose: Diagnostic/Therapeutic Indications: Thoracic back pain severe enough to impact quality of life or function. Rationale (medical necessity): procedure needed and proper for the diagnosis and/or treatment of Colleen [name]'s medical symptoms and needs. 1. Thoracic facet joint syndrome   2. Thoracic degenerative disc disease    NAS-11 Pain score:   Pre-procedure: 9 /10   Post-procedure: 9 /10     Target: Thoracic posterior (dorsal) primary rami nerve Location: 2.5 cm lateral to midline (spinous process), just cephalad to upper transverse process edge.  (needle tip placement) Region: Thoracic  Approach: Paravertebral  Type of procedure: Percutaneous perineural nerve block   Pertinent Anatomy: There are two potential fascial  compartments in the TPVS: the anterior extrapleural paravertebral compartment and the posterior subendothoracic paravertebral compartment. The transverse process and the superior costotransverse ligament form the posterior boundary.  Position / Prep / Materials:  Position: Prone  Prep solution: DuraPrep (Iodine Povacrylex [0.7% available iodine] and Isopropyl Alcohol, 74% w/w) Prep Area: Entire upper back region Materials:  Tray: Block Needle(s):  Type: Spinal  Gauge (G): 22  Length: 3.5-in  Qty: 4  Pre-op H&P Assessment:  Colleen [name] is a 61 y.o. (year old), female patient, seen today for interventional treatment. She  has a past surgical history that includes Nephrectomy (Left); Mastectomy (Left); Tubal ligation; Mastectomy; Breast biopsy (Left, 2004); Breast biopsy (Right); Breast lumpectomy (Left, 2004); LEFT HEART CATH AND CORONARY ANGIOGRAPHY (N/A, 11/06/2021); Esophagogastroduodenoscopy (egd) with propofol (N/A, 06/21/2022); and Colonoscopy with propofol (N/A, 06/21/2022). Colleen [name] has a current medication list which includes the following prescription(s): albuterol, carvedilol, cholecalciferol, denosumab, doxycycline, epinephrine, fluoxetine, fluticasone, trelegy ellipta, hydroxyzine, ipratropium, ipratropium, methocarbamol, montelukast, pantoprazole, cyanocobalamin, and aspirin ec. Her primarily concern today is the Back Pain  Initial Vital Signs:  Pulse/HCG Rate: 91ECG Heart Rate: 92 Temp: 97.8 F (36.6 C) Resp: 16 BP: (!) 156/92 SpO2: 92 %  BMI: Estimated body mass index is 20.51 kg/m as calculated from the following:   Height as of this encounter: 5' (1.524 m).   Weight as of this encounter: 105 lb (47.6 kg).  Risk Assessment: Allergies: Reviewed. She is allergic to desvenlafaxine, morphine and related, penicillins, prazosin, tamoxifen, trazodone, clonazepam, codeine, duloxetine, gabapentin, hydroxyzine, lorazepam, paroxetine hcl, sulfa antibiotics, and cephalexin.  Allergy  Precautions: None required Coagulopathies: Reviewed. None identified.  Blood-thinner therapy: None at this time Active Infection(s): Reviewed. None identified. Colleen [name] is afebrile  Site Confirmation: Colleen [name] was asked to confirm the procedure and laterality before marking the site Procedure checklist: Completed Consent: Before  the procedure and under the influence of no sedative(s), amnesic(s), or anxiolytics, the patient was informed of the treatment options, risks and possible complications. To fulfill our ethical and legal obligations, as recommended by the American Medical Association's Code of Ethics, I have informed the patient of my clinical impression; the nature and purpose of the treatment or procedure; the risks, benefits, and possible complications of the intervention; the alternatives, including doing nothing; the risk(s) and benefit(s) of the alternative treatment(s) or procedure(s); and the risk(s) and benefit(s) of doing nothing. The patient was provided information about the general risks and possible complications associated with the procedure. These may include, but are not limited to: failure to achieve desired goals, infection, bleeding, organ or nerve damage, allergic reactions, paralysis, and death. In addition, the patient was informed of those risks and complications associated to Spine-related procedures, such as failure to decrease pain; infection (i.e.: Meningitis, epidural or intraspinal abscess); bleeding (i.e.: epidural hematoma, subarachnoid hemorrhage, or any other type of intraspinal or peri-dural bleeding); organ or nerve damage (i.e.: Any type of peripheral nerve, nerve root, or spinal cord injury) with subsequent damage to sensory, motor, and/or autonomic systems, resulting in permanent pain, numbness, and/or weakness of one or several areas of the body; allergic reactions; (i.e.: anaphylactic reaction); and/or death. Furthermore, the patient was informed of those  risks and complications associated with the medications. These include, but are not limited to: allergic reactions (i.e.: anaphylactic or anaphylactoid reaction(s)); adrenal axis suppression; blood sugar elevation that in diabetics may result in ketoacidosis or comma; water retention that in patients with history of congestive heart failure may result in shortness of breath, pulmonary edema, and decompensation with resultant heart failure; weight gain; swelling or edema; medication-induced neural toxicity; particulate matter embolism and blood vessel occlusion with resultant organ, and/or nervous system infarction; and/or aseptic necrosis of one or more joints. Finally, the patient was informed that Medicine is not an exact science; therefore, there is also the possibility of unforeseen or unpredictable risks and/or possible complications that may result in a catastrophic outcome. The patient indicated having understood very clearly. We have given the patient no guarantees and we have made no promises. Enough time was given to the patient to ask questions, all of which were answered to the patient's satisfaction. Ms. Wacha has indicated that she wanted to continue with the procedure. Attestation: I, the ordering provider, attest that I have discussed with the patient the benefits, risks, side-effects, alternatives, likelihood of achieving goals, and potential problems during recovery for the procedure that I have provided informed consent. Date  Time: 10/01/2022  8:30 AM  Pre-Procedure Preparation:  Monitoring: As per clinic protocol. Respiration, ETCO2, SpO2, BP, heart rate and rhythm monitor placed and checked for adequate function Safety Precautions: Patient was assessed for positional comfort and pressure points before starting the procedure. Time-out: I initiated and conducted the "Time-out" before starting the procedure, as per protocol. The patient was asked to participate by confirming the accuracy  of the "Time Out" information. Verification of the correct person, site, and procedure were performed and confirmed by me, the nursing staff, and the patient. "Time-out" conducted as per Joint Commission's Universal Protocol (UP.01.01.01). Time: 0934  Description/Narrative of Procedure:          Technical description of procedure:  Rationale (medical necessity): procedure needed and proper for the diagnosis and/or treatment of the patient's medical symptoms and needs. Procedural Technique Safety Precautions: Aspiration looking for blood return was conducted prior to all injections. At  no point did we inject any substances, as a needle was being advanced. No attempts were made at seeking any paresthesias. Safe injection practices and needle disposal techniques used. Medications properly checked for expiration dates. SDV (single dose vial) medications used. Target Area: For the thoracic medial branch nerve, the target is located between the top of the transverse processes at the point where the transverse process joints the vertebra and the superior-lateral aspect of the transverse process.  (More lateral towards the superior aspect of the thoracic spine.) For safety reasons and to minimize the risk of hemo- or pneumothorax, the needle tip was not advanced past the boundary of the posterior paravertebral compartment (superior costotransverse ligament). Description of the Procedure: Protocol guidelines were followed. The patient was placed in position over the fluoroscopy table. The target area was identified and the area prepped in the usual manner. Skin & deeper tissues infiltrated with local anesthetic. Appropriate amount of time allowed to pass for local anesthetics to take effect. The procedure needles were then advanced to the target area. Proper needle placement secured. Negative aspiration confirmed. Solution injected in intermittent fashion, asking for systemic symptoms every 0.5cc of injectate. The  needles were then removed and the area cleansed, making sure to leave some of the prepping solution back to take advantage of its long term bactericidal properties.    16 cc solution made of 14 cc of 0.2% ropivacaine, 2 cc of Decadron 10 mg/cc.  2 cc injected injected bilaterally at T4, T5, T6, T7.           Vitals:   10/01/22 0929 10/01/22 0935 10/01/22 0940 10/01/22 0944  BP: (!) 150/103 (!) 151/97 (!) 192/98   Pulse:      Resp: 18 (!) 24 20 (!) 21  Temp:      SpO2: 99% 99% 100% 100%  Weight:      Height:         Start Time: 0935 hrs. End Time: 0944 hrs.  Imaging Guidance:          Type of Imaging Technique: Fluoroscopy Guidance (Spinal) Indication(s): Assistance in needle guidance and placement for procedures requiring needle placement in or near specific anatomical locations not easily accessible without such assistance. Exposure Time: Please see nurses notes. Contrast: None used. Fluoroscopic Guidance: I was personally present during the use of fluoroscopy. "Tunnel Vision Technique" used to obtain the best possible view of the target area. Parallax error corrected before commencing the procedure. "Direction-depth-direction" technique used to introduce the needle under continuous pulsed fluoroscopy. Once target was reached, antero-posterior, oblique, and lateral fluoroscopic projection used confirm needle placement in all planes. Images permanently stored in EMR. Ultrasound Guidance: N/A Interpretation: N/A  Post-operative Assessment:  Post-procedure Vital Signs:  Pulse/HCG Rate: 9191 Temp: 97.8 F (36.6 C) Resp: (!) 21 BP: (!) 192/98 SpO2: 100 %  EBL: None  Complications: No immediate post-treatment complications observed by team, or reported by patient.  Note: The patient tolerated the entire procedure well. A repeat set of vitals were taken after the procedure and the patient was kept under observation following institutional policy, for this type of procedure.  Post-procedural neurological assessment was performed, showing return to baseline, prior to discharge. The patient was provided with post-procedure discharge instructions, including a section on how to identify potential problems. Should any problems arise concerning this procedure, the patient was given instructions to immediately contact us, at any time, without hesitation. In any case, we plan to contact the patient by telephone for a  follow-up status report regarding this interventional procedure.  Comments:  No additional relevant information.  Plan of Care  Orders:  Orders Placed This Encounter  Procedures   DG PAIN CLINIC C-ARM 1-60 MIN NO REPORT    Intraoperative interpretation by procedural physician at [place].    Standing Status:   Standing    Number of Occurrences:   1    Order Specific Question:   Reason for exam:    Answer:   Assistance in needle guidance and placement for procedures requiring needle placement in or near specific anatomical locations not easily accessible without such assistance.     Medications ordered for procedure: Meds ordered this encounter  Medications   lidocaine (XYLOCAINE) 2 % (with pres) injection 400 mg   dexamethasone (DECADRON) injection 10 mg   ropivacaine (PF) 2 mg/mL (0.2%) (NAROPIN) injection 9 mL   dexamethasone (DECADRON) injection 10 mg   Medications administered: We administered lidocaine, dexamethasone, ropivacaine (PF) 2 mg/mL (0.2%), and dexamethasone.  See the medical record for exact dosing, route, and time of administration.  Follow-up plan:   Return in 4 weeks (on 10/29/2022) for virtual PPE.       Status post right L4-L5 ESI #1 on 06/11/2020, #2 10/22/20, bilateral C3, C4, C5 cervical facet medial branch nerve block 07/25/2020 with thoracic TPI: only helped for 24 hrs, bilateral T4, T5, T6, T7 thoracic medial branch nerve block 10/01/2022           Recent Visits Date Type Provider Dept  09/11/22 Office Visit  Gillis Santa, MD [facility]  Showing recent visits within past 90 days and meeting all other requirements Today's Visits Date Type Provider Dept  10/01/22 Procedure visit Gillis Santa, MD [facility]  Showing today's visits and meeting all other requirements Future Appointments Date Type Provider Dept  10/29/22 Appointment Gillis Santa, MD [facility]  Showing future appointments within next 90 days and meeting all other requirements  Disposition: Discharge home  Discharge (Date  Time): 10/01/2022; 0955 hrs.   Primary Care Physician: Remi Haggard, FNP Location: [facility] Note by: Gillis Santa, MD Date: 10/01/2022; Time: 10:12 AM  Disclaimer:  Medicine is not an exact science. The only guarantee in medicine is that nothing is guaranteed. It is important to note that the decision to proceed with this intervention was based on the information collected from the patient. The Data and conclusions were drawn from the patient's questionnaire, the interview, and the physical examination. Because the information was provided in large part by the patient, it cannot be guaranteed that it has not been purposely or unconsciously manipulated. Every effort has been made to obtain as much relevant data as possible for this evaluation. It is important to note that the conclusions that lead to this procedure are derived in large part from the available data. Always take into account that the treatment will also be dependent on availability of resources and existing treatment guidelines, considered by other Pain Management Practitioners as being common knowledge and practice, at the time of the intervention. For Medico-Legal purposes, it is also important to point out that variation in procedural techniques and pharmacological choices are the acceptable norm. The indications, contraindications, technique, and results of the above procedure  should only be interpreted and judged by a Board-Certified Interventional Pain Specialist with extensive familiarity and expertise in the same exact procedure and technique.

## 2022-10-28 ENCOUNTER — Telehealth: Payer: Self-pay

## 2022-10-28 NOTE — Telephone Encounter (Signed)
LM  to call office for pre virtual appointment questions 

## 2022-10-29 ENCOUNTER — Encounter: Payer: Self-pay | Admitting: Student in an Organized Health Care Education/Training Program

## 2022-10-29 ENCOUNTER — Ambulatory Visit
Attending: Student in an Organized Health Care Education/Training Program | Admitting: Student in an Organized Health Care Education/Training Program

## 2022-10-29 DIAGNOSIS — G894 Chronic pain syndrome: Secondary | ICD-10-CM | POA: Diagnosis not present

## 2022-10-29 DIAGNOSIS — M47894 Other spondylosis, thoracic region: Secondary | ICD-10-CM

## 2022-10-29 DIAGNOSIS — M5134 Other intervertebral disc degeneration, thoracic region: Secondary | ICD-10-CM

## 2022-10-29 NOTE — Progress Notes (Signed)
Patient: Colleen [name]  Service Category: E/M  Provider: Gillis Santa, MD  DOB: 1961-03-11  DOS: 10/29/2022  Location: Office  MRN: [id number]  Setting: Ambulatory outpatient  Referring Provider: Remi Haggard, FNP  Type: Established Patient  Specialty: Interventional Pain Management  PCP: Remi Haggard, FNP  Location: Remote location  Delivery: TeleHealth     Virtual Encounter - Pain Management PROVIDER NOTE: Information contained herein reflects review and annotations entered in association with encounter. Interpretation of such information and data should be left to medically-trained personnel. Information provided to patient can be located elsewhere in the medical record under "Patient Instructions". Document created using STT-dictation technology, any transcriptional errors that may result from process are unintentional.    Contact & Pharmacy Preferred: [phone] Home: [phone] (home) Mobile: [phone] (mobile) E-mail: [email or web address]  [id number] [place]Phone: [phone]: [phone]  Pre-screening  Ms. Duplechain offered "in-person" vs "virtual" encounter. She indicated preferring virtual for this encounter.   Reason COVID-19*  Social distancing based on CDC and AMA recommendations.   I contacted VKerianne Gurron 10/29/2022 via telephone.      I clearly identified myself as BGillis Santa MD. I verified that I was speaking with the correct person using two identifiers (Name: VShenell Rogalski and date of birth: [birth year].  Consent I sought verbal advanced consent from VGreig Rightfor virtual visit interactions. I informed Ms. Chenault of possible security and privacy concerns, risks, and limitations associated with providing "not-in-person" medical evaluation and management services. I also informed Ms. Catanzaro of the availability of "in-person" appointments. Finally, I informed her that  there would be a charge for the virtual visit and that she could be  personally, fully or partially, financially responsible for it. Ms. MSolbergexpressed understanding and agreed to proceed.   Historic Elements   Ms. VEvia Goldsmithis a 61y.o. year old, female patient evaluated today after our last contact on 10/01/2022. Ms. MHadaway has a past medical history of Anxiety, Asthma, Breast cancer ([place] (2004), Chronic back pain, COPD (chronic obstructive pulmonary disease) [place], Depression, History of kidney surgery, Personal history of radiation therapy, and PTSD (post-traumatic stress disorder). She also  has a past surgical history that includes Nephrectomy (Left); Mastectomy (Left); Tubal ligation; Mastectomy; Breast biopsy (Left, 2004); Breast biopsy (Right); Breast lumpectomy (Left, 2004); LEFT HEART CATH AND CORONARY ANGIOGRAPHY (N/A, 11/06/2021); Esophagogastroduodenoscopy (egd) with propofol (N/A, 06/21/2022); and Colonoscopy with propofol (N/A, 06/21/2022). Ms. MArcandhas a current medication list which includes the following prescription(s): albuterol, cholecalciferol, denosumab, epinephrine, fluoxetine, fluticasone, trelegy ellipta, hydroxyzine, ipratropium, ipratropium, montelukast, cyanocobalamin, aspirin ec, carvedilol, doxycycline, methocarbamol, and pantoprazole. She  reports that she has been smoking cigarettes. She has been smoking an average of .5 packs per day. She has never been exposed to tobacco smoke. She has never used smokeless tobacco. She reports that she does not currently use drugs. She reports that she does not drink alcohol. Ms. MDeshotelsis allergic to desvenlafaxine, morphine and related, penicillins, prazosin, tamoxifen, trazodone, clonazepam, codeine, duloxetine, gabapentin, hydroxyzine, lorazepam, paroxetine hcl, sulfa antibiotics, and cephalexin.  Estimated body mass index is 20.51 kg/m as calculated from the following:   Height as of 10/01/22: 5' (1.524 m).   Weight as of  10/01/22: 105 lb (47.6 kg).  HPI  Today, she is being contacted for a post-procedure assessment.   Post-procedure evaluation   Type: Thoracic facet medial branch block #1  Laterality: Bilateral (-  50)  Level: T4, T5, T6, and T7 Medial Branch Level(s)  Imaging: Fluoroscopy-guided         Anesthesia: Local anesthesia (1-2% Lidocaine) DOS: 10/01/2022  Performed by: Gillis Santa, MD  Purpose: Diagnostic/Therapeutic Indications: Thoracic back pain severe enough to impact quality of life or function. Rationale (medical necessity): procedure needed and proper for the diagnosis and/or treatment of Ms. Qian's medical symptoms and needs. 1. Thoracic facet joint syndrome   2. Thoracic degenerative disc disease    NAS-11 Pain score:   Pre-procedure: 9 /10   Post-procedure: 9 /10      Effectiveness:  Initial hour after procedure: 75 %  Subsequent 4-6 hours post-procedure: 75 %  Analgesia past initial 6 hours: 75 % (ongoing)  Ongoing improvement:  Analgesic:  50-75% Function: Ms. Leiphart reports improvement in function ROM: Ms. Pantaleon reports improvement in ROM     Laboratory Chemistry Profile   Renal Lab Results  Component Value Date   BUN 5 (L) 07/16/2022   CREATININE 0.34 (L) 07/16/2022   GFRAA >60 03/21/2020   GFRNONAA >60 07/16/2022    Hepatic Lab Results  Component Value Date   AST 17 07/15/2022   ALT 13 07/15/2022   ALBUMIN 3.8 07/15/2022   ALKPHOS 43 07/15/2022   LIPASE 24 10/16/2020    Electrolytes Lab Results  Component Value Date   NA 129 (L) 07/16/2022   K 3.6 07/16/2022   CL 98 07/16/2022   CALCIUM 7.7 (L) 07/16/2022   MG 2.4 05/01/2022    Bone No results found for: "VD25OH", "VD125OH2TOT", "RS8546EV0", "JJ0093GH8", "25OHVITD1", "25OHVITD2", "25OHVITD3", "TESTOFREE", "TESTOSTERONE"  Inflammation (CRP: Acute Phase) (ESR: Chronic Phase) Lab Results  Component Value Date   LATICACIDVEN 1.1 03/30/2022         Note: Above Lab results  reviewed.    Assessment  The primary encounter diagnosis was Thoracic facet joint syndrome. Diagnoses of Thoracic degenerative disc disease and Chronic pain syndrome were also pertinent to this visit.  Plan of Care   Excellent analgesic and functional response after thoracic medial branch nerve blocks.  Patient continues to endorse 50 to 75% pain relief that is ongoing.  We discussed repeating this procedure should she have return of her thoracic mid back pain and considering radiofrequency ablation thereafter.  Patient endorsed understanding.   Follow-up plan:   Return if symptoms worsen or fail to improve.     Status post right L4-L5 ESI #1 on 06/11/2020, #2 10/22/20, bilateral C3, C4, C5 cervical facet medial branch nerve block 07/25/2020 with thoracic TPI: only helped for 24 hrs, bilateral T4, T5, T6, T7 thoracic medial branch nerve block 10/01/2022            Recent Visits Date Type Provider Dept  10/01/22 Procedure visit Gillis Santa, MD [facility]  09/11/22 Office Visit Gillis Santa, MD [facility]  Showing recent visits within past 90 days and meeting all other requirements Today's Visits Date Type Provider Dept  10/29/22 Office Visit Gillis Santa, MD [facility]  Showing today's visits and meeting all other requirements Future Appointments No visits were found meeting these conditions. Showing future appointments within next 90 days and meeting all other requirements  I discussed the assessment and treatment plan with the patient. The patient was provided an opportunity to ask questions and all were answered. The patient agreed with the plan and demonstrated an understanding of the instructions.  Patient advised to call back or seek an in-person evaluation if the symptoms or condition  worsens.  Duration of encounter: 17mnutes.  Note by: BGillis Santa MD Date: 10/29/2022; Time: 3:48 PM

## 2022-10-30 ENCOUNTER — Ambulatory Visit: Admitting: Student in an Organized Health Care Education/Training Program

## 2022-11-06 ENCOUNTER — Ambulatory Visit
Admission: RE | Admit: 2022-11-06 | Discharge: 2022-11-06 | Disposition: A | Discharge status: Home / Self Care | Source: Ambulatory Visit | Attending: Family Medicine | Admitting: Family Medicine

## 2022-11-06 DIAGNOSIS — Z1231 Encounter for screening mammogram for malignant neoplasm of breast: Secondary | ICD-10-CM | POA: Insufficient documentation

## 2022-11-21 ENCOUNTER — Telehealth: Admitting: Nurse Practitioner

## 2022-11-21 DIAGNOSIS — J441 Chronic obstructive pulmonary disease with (acute) exacerbation: Secondary | ICD-10-CM | POA: Diagnosis not present

## 2022-11-21 DIAGNOSIS — J324 Chronic pansinusitis: Secondary | ICD-10-CM

## 2022-11-21 MED ORDER — ALBUTEROL SULFATE HFA 108 (90 BASE) MCG/ACT IN AERS: 2.0000 | INHALATION_SPRAY | Freq: Four times a day (QID) | RESPIRATORY_TRACT | 0 refills | Status: AC | PRN

## 2022-11-21 MED ORDER — LEVOFLOXACIN 500 MG PO TABS: 500.0000 mg | ORAL_TABLET | Freq: Every day | ORAL | 0 refills | Status: AC

## 2022-11-21 NOTE — Progress Notes (Signed)
Virtual Visit Consent   Colleen [name], you are scheduled for a virtual visit with a [facility] provider today. Just as with appointments in the office, your consent must be obtained to participate. Your consent will be active for this visit and any virtual visit you may have with one of our providers in the next 365 days. If you have a MyChart account, a copy of this consent can be sent to you electronically.  As this is a virtual visit, video technology does not allow for your provider to perform a traditional examination. This may limit your provider's ability to fully assess your condition. If your provider identifies any concerns that need to be evaluated in person or the need to arrange testing (such as labs, EKG, etc.), we will make arrangements to do so. Although advances in technology are sophisticated, we cannot ensure that it will always work on either your end or our end. If the connection with a video visit is poor, the visit may have to be switched to a telephone visit. With either a video or telephone visit, we are not always able to ensure that we have a secure connection.  By engaging in this virtual visit, you consent to the provision of healthcare and authorize for your insurance to be billed (if applicable) for the services provided during this visit. Depending on your insurance coverage, you may receive a charge related to this service.  I need to obtain your verbal consent now. Are you willing to proceed with your visit today? Colleen [name] has provided verbal consent on 11/21/2022 for a virtual visit (video or telephone). Colleen Schneiders, FNP  Date: 11/21/2022 10:47 AM  Virtual Visit via Video Note   I, Colleen [name], connected with  Colleen [name]  ([id number], Aug 12, 1961) on 11/21/22 at 10:45 AM EST by a video-enabled telemedicine application and verified that I am speaking with the correct person using two identifiers.  Location: Patient: Virtual Visit Location Patient:  Home Provider: Virtual Visit Location Provider: Home Office   I discussed the limitations of evaluation and management by telemedicine and the availability of in person appointments. The patient expressed understanding and agreed to proceed.    C.C. Yellowish and green post nasal drip going down back of throat with bad taste   History of Present Illness: Colleen [name] is a 62 y.o. who identifies as a female who was assigned female at birth, and is being seen today for post nasal drainage and sinus congestion that has persisted since November.   She was on doxycyline 09/29/22 and feels the congestion has persisted since that time  She has been using nasal sprays as well   She has thick post nasal drainage No fever   PND is going into her chest and causing her to cough   History of COPD  Uses Trelegy inhaler daily  Albuterol as needed and has increased that usage over the past few days  Denies tightness in chest   Using cough drops OTC   Problems:  Patient Active Problem List   Diagnosis Date Noted   Thoracic facet joint syndrome 09/11/2022   Thoracic degenerative disc disease 09/11/2022   Chest pain 07/16/2022   Protein-calorie malnutrition, moderate ([place]) 06/21/2022   Chronic diarrhea of unknown origin    Loss of weight    Gastric erosion    Dysphagia 06/20/2022   Right buttock pain 06/19/2022   Right-sided chest pain 06/18/2022   Coronary artery disease 03/10/2022   Dependence on nocturnal oxygen therapy 03/10/2022  At risk for prolonged QT interval syndrome 11/19/2021   Tobacco use disorder 06/19/2021   PTSD (post-traumatic stress disorder) 03/04/2021   Myofascial pain syndrome 07/09/2020   Chronic pain syndrome 06/04/2020   Osteopenia of multiple sites 06/04/2020   Age related osteoporosis 06/04/2020   Low back pain 04/27/2020   Panic attack 03/20/2020   COPD (chronic obstructive pulmonary disease) ([place]) 10/11/2018   Generalized anxiety disorder 10/11/2018     Allergies:  Allergies  Allergen Reactions   Desvenlafaxine Anaphylaxis   Morphine And Related Anaphylaxis   Penicillins Anaphylaxis    Has patient had a PCN reaction causing immediate rash, facial/tongue/throat swelling, SOB or lightheadedness with hypotension: Yes Has patient had a PCN reaction causing severe rash involving mucus membranes or skin necrosis: No Has patient had a PCN reaction that required hospitalization: Unknown Has patient had a PCN reaction occurring within the last 10 years: No If all of the above answers are "NO", then may proceed with Cephalosporin use.    Prazosin Other (See Comments)   Tamoxifen Anaphylaxis   Trazodone Anaphylaxis   Clonazepam    Codeine Swelling   Duloxetine Itching   Gabapentin Hives    Rash and swelling.    Hydroxyzine Itching   Lorazepam     Patient feels this medications makes her to sedated and wants to avoid   Paroxetine Hcl Hives   Sulfa Antibiotics Itching   Cephalexin Rash   Medications:  Current Outpatient Medications:    albuterol (VENTOLIN HFA) 108 (90 Base) MCG/ACT inhaler, Inhale 1-2 puffs into the lungs every 4 (four) hours as needed., Disp: 8 g, Rfl: 2   aspirin EC 81 MG tablet, Take 1 tablet (81 mg total) by mouth daily. Swallow whole., Disp: 30 tablet, Rfl: 12   carvedilol (COREG) 3.125 MG tablet, Take 1 tablet (3.125 mg total) by mouth 2 (two) times daily with a meal., Disp: 60 tablet, Rfl: 0   cholecalciferol (VITAMIN D3) 25 MCG (1000 UNIT) tablet, Take 1,000 Units by mouth daily., Disp: , Rfl:    denosumab (PROLIA) 60 MG/ML SOSY injection, Inject 60 mg into the skin every 6 (six) months., Disp: , Rfl:    doxycycline (VIBRA-TABS) 100 MG tablet, Take 1 tablet (100 mg total) by mouth 2 (two) times daily., Disp: 20 tablet, Rfl: 0   EPINEPHrine 0.3 mg/0.3 mL IJ SOAJ injection, Inject 0.3 mg into the muscle as needed for anaphylaxis., Disp: , Rfl:    FLUoxetine (PROZAC) 40 MG capsule, Take 1 capsule (40 mg total) by mouth  daily., Disp: 30 capsule, Rfl: 3   fluticasone (FLONASE) 50 MCG/ACT nasal spray, Place 1 spray into both nostrils 2 (two) times daily., Disp: , Rfl:    Fluticasone-Umeclidin-Vilant (TRELEGY ELLIPTA) 100-62.5-25 MCG/INH AEPB, Inhale 1 puff into the lungs daily., Disp: , Rfl:    hydrOXYzine (ATARAX) 50 MG tablet, Take 1 tablet (50 mg total) by mouth 3 (three) times daily as needed., Disp: 30 tablet, Rfl: 0   ipratropium (ATROVENT) 0.02 % nebulizer solution, Take 0.5 mg by nebulization 4 (four) times daily., Disp: , Rfl:    ipratropium (ATROVENT) 0.03 % nasal spray, Place 2 sprays into both nostrils every 12 (twelve) hours., Disp: 30 mL, Rfl: 0   methocarbamol (ROBAXIN) 750 MG tablet, Take 1 tablet (750 mg total) by mouth 4 (four) times daily., Disp: 120 tablet, Rfl: 0   montelukast (SINGULAIR) 10 MG tablet, Take 10 mg by mouth daily., Disp: , Rfl:    pantoprazole (PROTONIX) 40 MG tablet, Take  1 tablet (40 mg total) by mouth 2 (two) times daily before a meal., Disp: 60 tablet, Rfl: 0   vitamin B-12 (CYANOCOBALAMIN) 500 MCG tablet, Take 500 mcg by mouth daily., Disp: , Rfl:   Observations/Objective: Patient is well-developed, well-nourished in no acute distress.  Resting comfortably  at home.  Head is normocephalic, atraumatic.  No labored breathing.  Speech is clear and coherent with logical content.  Patient is alert and oriented at baseline.    Assessment and Plan: 1. Chronic pansinusitis  - levofloxacin (LEVAQUIN) 500 MG tablet; Take 1 tablet (500 mg total) by mouth daily for 7 days.  Dispense: 7 tablet; Refill: 0  2. Chronic obstructive pulmonary disease with acute exacerbation (HCC)  - levofloxacin (LEVAQUIN) 500 MG tablet; Take 1 tablet (500 mg total) by mouth daily for 7 days.  Dispense: 7 tablet; Refill: 0 - albuterol (VENTOLIN HFA) 108 (90 Base) MCG/ACT inhaler; Inhale 2 puffs into the lungs every 6 (six) hours as needed for wheezing or shortness of breath.  Dispense: 8 g; Refill:  0    Follow Up Instructions: I discussed the assessment and treatment plan with the patient. The patient was provided an opportunity to ask questions and all were answered. The patient agreed with the plan and demonstrated an understanding of the instructions.  A copy of instructions were sent to the patient via MyChart unless otherwise noted below.    The patient was advised to call back or seek an in-person evaluation if the symptoms worsen or if the condition fails to improve as anticipated.  Time:  I spent 10 minutes with the patient via telehealth technology discussing the above problems/concerns.    Colleen Schneiders, FNP

## 2023-01-02 ENCOUNTER — Emergency Department

## 2023-01-02 ENCOUNTER — Emergency Department
Admission: EM | Admit: 2023-01-02 | Discharge: 2023-01-02 | Disposition: A | Discharge status: Home / Self Care | Attending: Emergency Medicine | Admitting: Emergency Medicine

## 2023-01-02 ENCOUNTER — Other Ambulatory Visit: Payer: Self-pay

## 2023-01-02 DIAGNOSIS — E871 Hypo-osmolality and hyponatremia: Secondary | ICD-10-CM | POA: Diagnosis not present

## 2023-01-02 DIAGNOSIS — J441 Chronic obstructive pulmonary disease with (acute) exacerbation: Secondary | ICD-10-CM | POA: Insufficient documentation

## 2023-01-02 DIAGNOSIS — Z20822 Contact with and (suspected) exposure to covid-19: Secondary | ICD-10-CM | POA: Diagnosis not present

## 2023-01-02 DIAGNOSIS — R079 Chest pain, unspecified: Secondary | ICD-10-CM | POA: Diagnosis present

## 2023-01-02 LAB — CBC WITH DIFFERENTIAL/PLATELET
Abs Immature Granulocytes: 0.02 10*3/uL (ref 0.00–0.07)
Basophils Absolute: 0.1 10*3/uL (ref 0.0–0.1)
Basophils Relative: 1 %
Eosinophils Absolute: 0.1 10*3/uL (ref 0.0–0.5)
Eosinophils Relative: 1 %
HCT: 40.4 % (ref 36.0–46.0)
Hemoglobin: 13.4 g/dL (ref 12.0–15.0)
Immature Granulocytes: 0 %
Lymphocytes Relative: 20 %
Lymphs Abs: 1.3 10*3/uL (ref 0.7–4.0)
MCH: 28.8 pg (ref 26.0–34.0)
MCHC: 33.2 g/dL (ref 30.0–36.0)
MCV: 86.9 fL (ref 80.0–100.0)
Monocytes Absolute: 0.5 10*3/uL (ref 0.1–1.0)
Monocytes Relative: 8 %
Neutro Abs: 4.5 10*3/uL (ref 1.7–7.7)
Neutrophils Relative %: 70 %
Platelets: 296 10*3/uL (ref 150–400)
RBC: 4.65 MIL/uL (ref 3.87–5.11)
RDW: 13.5 % (ref 11.5–15.5)
WBC: 6.5 10*3/uL (ref 4.0–10.5)
nRBC: 0 % (ref 0.0–0.2)

## 2023-01-02 LAB — COMPREHENSIVE METABOLIC PANEL
ALT: 12 U/L (ref 0–44)
AST: 18 U/L (ref 15–41)
Albumin: 4.3 g/dL (ref 3.5–5.0)
Alkaline Phosphatase: 47 U/L (ref 38–126)
Anion gap: 11 (ref 5–15)
BUN: 6 mg/dL — ABNORMAL LOW (ref 8–23)
CO2: 26 mmol/L (ref 22–32)
Calcium: 8.9 mg/dL (ref 8.9–10.3)
Chloride: 89 mmol/L — ABNORMAL LOW (ref 98–111)
Creatinine, Ser: 0.39 mg/dL — ABNORMAL LOW (ref 0.44–1.00)
GFR, Estimated: >60 mL/min (ref >60)
Glucose, Bld: 95 mg/dL (ref 70–99)
Potassium: 4.2 mmol/L (ref 3.5–5.1)
Sodium: 126 mmol/L — ABNORMAL LOW (ref 135–145)
Total Bilirubin: 0.7 mg/dL (ref 0.3–1.2)
Total Protein: 7.5 g/dL (ref 6.5–8.1)

## 2023-01-02 LAB — TROPONIN I (HIGH SENSITIVITY)
Troponin I (High Sensitivity): 6 ng/L (ref <18)
Troponin I (High Sensitivity): 6 ng/L (ref <18)

## 2023-01-02 LAB — RESP PANEL BY RT-PCR (RSV, FLU A&B, COVID)  RVPGX2
Influenza A by PCR: NEGATIVE
Influenza B by PCR: NEGATIVE
Resp Syncytial Virus by PCR: NEGATIVE
SARS Coronavirus 2 by RT PCR: NEGATIVE

## 2023-01-02 LAB — LIPASE, BLOOD: Lipase: 29 U/L (ref 11–51)

## 2023-01-02 LAB — D-DIMER, QUANTITATIVE: D-Dimer, Quant: 0.32 ug/mL-FEU (ref 0.00–0.50)

## 2023-01-02 MED ORDER — PREDNISONE 20 MG PO TABS: 40.0000 mg | ORAL_TABLET | Freq: Every day | ORAL | 0 refills | Status: AC

## 2023-01-02 MED ORDER — HYDROXYZINE HCL 25 MG PO TABS
50.0000 mg | ORAL_TABLET | Freq: Once | ORAL | Status: AC
  Administered 2023-01-02: 50 mg via ORAL
  Filled 2023-01-02: qty 2

## 2023-01-02 MED ORDER — IPRATROPIUM-ALBUTEROL 0.5-2.5 (3) MG/3ML IN SOLN
3.0000 mL | Freq: Once | RESPIRATORY_TRACT | Status: AC
  Administered 2023-01-02: 3 mL via RESPIRATORY_TRACT
  Filled 2023-01-02: qty 3

## 2023-01-02 MED ORDER — SODIUM CHLORIDE 0.9 % IV SOLN: Freq: Once | INTRAVENOUS | Status: DC

## 2023-01-02 MED ORDER — ONDANSETRON HCL 4 MG/2ML IJ SOLN
4.0000 mg | Freq: Once | INTRAMUSCULAR | Status: AC
  Administered 2023-01-02: 4 mg via INTRAVENOUS
  Filled 2023-01-02: qty 2

## 2023-01-02 MED ORDER — ACETAMINOPHEN 500 MG PO TABS
1000.0000 mg | ORAL_TABLET | Freq: Once | ORAL | Status: AC
  Administered 2023-01-02: 1000 mg via ORAL
  Filled 2023-01-02: qty 2

## 2023-01-02 MED ORDER — LEVOFLOXACIN 500 MG PO TABS: 500.0000 mg | ORAL_TABLET | Freq: Every day | ORAL | 0 refills | Status: AC

## 2023-01-02 MED ORDER — SODIUM CHLORIDE 0.9 % IV BOLUS
500.0000 mL | Freq: Once | INTRAVENOUS | Status: AC
  Administered 2023-01-02: 500 mL via INTRAVENOUS

## 2023-01-02 MED ORDER — LIDOCAINE 5 % EX PTCH
1.0000 | MEDICATED_PATCH | Freq: Once | CUTANEOUS | Status: DC
  Administered 2023-01-02: 1 via TRANSDERMAL
  Filled 2023-01-02: qty 1

## 2023-01-02 MED ORDER — METHYLPREDNISOLONE SODIUM SUCC 125 MG IJ SOLR
125.0000 mg | Freq: Once | INTRAMUSCULAR | Status: AC
  Administered 2023-01-02: 125 mg via INTRAVENOUS
  Filled 2023-01-02: qty 2

## 2023-01-02 MED ORDER — FENTANYL CITRATE PF 50 MCG/ML IJ SOSY
50.0000 ug | PREFILLED_SYRINGE | Freq: Once | INTRAMUSCULAR | Status: AC
  Administered 2023-01-02: 50 ug via INTRAVENOUS
  Filled 2023-01-02: qty 1

## 2023-01-02 NOTE — ED Triage Notes (Signed)
Pt here with congestion. Pt states her oxygen has been low lately, coughing up yellow mucus as well. Pt states she had a fever last night as well with some chest tightness. Pt wears oxygen, 1-1.5L, as needed at home.

## 2023-01-02 NOTE — Discharge Instructions (Addendum)
The antibiotics and steroids to see if this help improve your symptoms if symptoms are worsening return to the ER for repeat evaluation.  Your sodium levels were low and these will need close follow-up with your primary care doctor.  Please call them today to make an appointment for early next week try to increase some salt in your diet.

## 2023-01-02 NOTE — ED Triage Notes (Signed)
Pt states she took her Atarax and her albuterol at 0400 this morning.

## 2023-01-02 NOTE — ED Provider Notes (Signed)
[facility] Provider Note    Event Date/Time   First MD Initiated Contact with Patient 01/02/23 [phone]     (approximate)   History   Nasal Congestion   HPI  Colleen [name] is a 62 y.o. female with history of COPD, anxiety who comes in with concerns for worsening chest pain, shortness of breath.  Patient reports over the past 2 weeks had a lot of congestion that seems to travel down into her chest.  She reports last been on antibiotics the beginning of January.  She reports coughing up a lot of phlegm with mucus in it.  She reports that this leading to some increasing chest pain over the past 4 days and shortness of breath.  She is been requiring her 1.5 of oxygen that she uses as needed more continuously.  Denies any history of blood clots or leg swelling or any other concerns.   Physical Exam   Triage Vital Signs: ED Triage Vitals [01/02/23 0919]  Enc Vitals Group     BP (!) 129/99     Pulse Rate (!) 107     Resp (!) 24     Temp 98 F (36.7 C)     Temp Source Oral     SpO2 92 %     Weight 104 lb 15 oz (47.6 kg)     Height 5' (1.524 m)     Head Circumference      Peak Flow      Pain Score 10     Pain Loc      Pain Edu?      Excl. in [place]?     Most recent vital signs: Vitals:   01/02/23 0919  BP: (!) 129/99  Pulse: (!) 107  Resp: (!) 24  Temp: 98 F (36.7 C)  SpO2: 92%     General: Awake, no distress.  CV:  Good peripheral perfusion.  Resp:  Normal effort.  Frequently coughing. Abd:  No distention.  Other:  No swelling in legs.  No calf tenderness.   ED Results / Procedures / Treatments   Labs (all labs ordered are listed, but only abnormal results are displayed) Labs Reviewed  COMPREHENSIVE METABOLIC PANEL - Abnormal; Notable for the following components:      Result Value   Sodium 126 (*)    Chloride 89 (*)    BUN 6 (*)    Creatinine, Ser 0.39 (*)    All other components within normal limits  RESP PANEL BY RT-PCR (RSV, FLU  A&B, COVID)  [id number]  CBC WITH DIFFERENTIAL/PLATELET  LIPASE, BLOOD  D-DIMER, QUANTITATIVE  TROPONIN I (HIGH SENSITIVITY)  TROPONIN I (HIGH SENSITIVITY)     EKG  My interpretation of EKG:  Sinus tachycardia rate of 104 without any ST elevation or T wave inversions, normal intervals  RADIOLOGY I have reviewed the xray personally and interpreted and no pneumonia  PROCEDURES:  Critical Care performed: No  .1-3 Lead EKG Interpretation  Performed by: Vanessa Tequesta, MD Authorized by: Vanessa Tununak, MD     Interpretation: normal     ECG rate:  90   ECG rate assessment: normal     Rhythm: sinus rhythm     Ectopy: none     Conduction: normal      MEDICATIONS ORDERED IN ED: Medications  lidocaine (LIDODERM) 5 % 1 patch (1 patch Transdermal Patch Applied 01/02/23 1000)  ipratropium-albuterol (DUONEB) 0.5-2.5 (3) MG/3ML nebulizer solution 3 mL (3 mLs Nebulization Given  01/02/23 1000)  methylPREDNISolone sodium succinate (SOLU-MEDROL) 125 mg/2 mL injection 125 mg (125 mg Intravenous Given 01/02/23 0959)  fentaNYL (SUBLIMAZE) injection 50 mcg (50 mcg Intravenous Given 01/02/23 0959)  ondansetron (ZOFRAN) injection 4 mg (4 mg Intravenous Given 01/02/23 0959)  hydrOXYzine (ATARAX) tablet 50 mg (50 mg Oral Given 01/02/23 1138)  acetaminophen (TYLENOL) tablet 1,000 mg (1,000 mg Oral Given 01/02/23 1227)  ipratropium-albuterol (DUONEB) 0.5-2.5 (3) MG/3ML nebulizer solution 3 mL (3 mLs Nebulization Given 01/02/23 1324)  sodium chloride 0.9 % bolus 500 mL (500 mLs Intravenous New Bag/Given 01/02/23 1316)     IMPRESSION / MDM / ASSESSMENT AND PLAN / ED COURSE  I reviewed the triage vital signs and the nursing notes.   Patient's presentation is most consistent with acute presentation with potential threat to life or bodily function.   Differential includes COPD, pneumonia, PE, ACS.  D-dimer negative.  Troponin is negative x 2.  COVID and flu are negative CBC reassuring CMP with slightly low  sodium and chloride but similar to her priors 129-1 28.  She has got chronic chronic hyponatremia and she is asymptomatic for we discussed whether or not we should admit patient based upon this and given this is more chronic and asymptomatic they will follow this up with her primary care doctor increase some salt in her diet she is also given 500 cc of fluid to try to help.  Patient feeling much better upon reassessment vitals have stabilized on her normal oxygen we discussed admission versus going home they feel comfortable trialing outpatient with some prednisone.  We discussed pros and cons of antibiotics and they would like to try a course of Levaquin given she is tolerated this previously.  The patient is on the cardiac monitor to evaluate for evidence of arrhythmia and/or significant heart rate changes.      FINAL CLINICAL IMPRESSION(S) / ED DIAGNOSES   Final diagnoses:  COPD exacerbation ([place])  Hyponatremia     Rx / DC Orders   ED Discharge Orders          Ordered    predniSONE (DELTASONE) 20 MG tablet  Daily with breakfast        01/02/23 1409    levofloxacin (LEVAQUIN) 500 MG tablet  Daily        01/02/23 1409             Note:  This document was prepared using Dragon voice recognition software and may include unintentional dictation errors.   Vanessa Risingsun, MD 01/02/23 [phone]

## 2023-03-05 ENCOUNTER — Inpatient Hospital Stay
Admission: EM | Admit: 2023-03-05 | Discharge: 2023-03-09 | DRG: 190 | Disposition: A | Discharge status: Home / Self Care | Attending: Student in an Organized Health Care Education/Training Program | Admitting: Student in an Organized Health Care Education/Training Program

## 2023-03-05 ENCOUNTER — Emergency Department

## 2023-03-05 ENCOUNTER — Other Ambulatory Visit: Payer: Self-pay

## 2023-03-05 ENCOUNTER — Encounter: Payer: Self-pay | Admitting: Intensive Care

## 2023-03-05 DIAGNOSIS — Z923 Personal history of irradiation: Secondary | ICD-10-CM | POA: Diagnosis not present

## 2023-03-05 DIAGNOSIS — Z87891 Personal history of nicotine dependence: Secondary | ICD-10-CM

## 2023-03-05 DIAGNOSIS — Z9981 Dependence on supplemental oxygen: Secondary | ICD-10-CM

## 2023-03-05 DIAGNOSIS — Z1152 Encounter for screening for COVID-19: Secondary | ICD-10-CM | POA: Diagnosis not present

## 2023-03-05 DIAGNOSIS — B9689 Other specified bacterial agents as the cause of diseases classified elsewhere: Secondary | ICD-10-CM

## 2023-03-05 DIAGNOSIS — Z853 Personal history of malignant neoplasm of breast: Secondary | ICD-10-CM

## 2023-03-05 DIAGNOSIS — J439 Emphysema, unspecified: Secondary | ICD-10-CM | POA: Diagnosis present

## 2023-03-05 DIAGNOSIS — F4310 Post-traumatic stress disorder, unspecified: Secondary | ICD-10-CM | POA: Diagnosis not present

## 2023-03-05 DIAGNOSIS — M549 Dorsalgia, unspecified: Secondary | ICD-10-CM | POA: Diagnosis not present

## 2023-03-05 DIAGNOSIS — I429 Cardiomyopathy, unspecified: Secondary | ICD-10-CM | POA: Diagnosis not present

## 2023-03-05 DIAGNOSIS — Z882 Allergy status to sulfonamides status: Secondary | ICD-10-CM | POA: Diagnosis not present

## 2023-03-05 DIAGNOSIS — M7918 Myalgia, other site: Secondary | ICD-10-CM | POA: Diagnosis not present

## 2023-03-05 DIAGNOSIS — Z888 Allergy status to other drugs, medicaments and biological substances status: Secondary | ICD-10-CM | POA: Diagnosis not present

## 2023-03-05 DIAGNOSIS — J441 Chronic obstructive pulmonary disease with (acute) exacerbation: Secondary | ICD-10-CM | POA: Diagnosis not present

## 2023-03-05 DIAGNOSIS — Z880 Allergy status to penicillin: Secondary | ICD-10-CM | POA: Diagnosis not present

## 2023-03-05 DIAGNOSIS — Z9012 Acquired absence of left breast and nipple: Secondary | ICD-10-CM | POA: Diagnosis not present

## 2023-03-05 DIAGNOSIS — Z881 Allergy status to other antibiotic agents status: Secondary | ICD-10-CM

## 2023-03-05 DIAGNOSIS — R0602 Shortness of breath: Secondary | ICD-10-CM | POA: Diagnosis present

## 2023-03-05 DIAGNOSIS — J9621 Acute and chronic respiratory failure with hypoxia: Principal | ICD-10-CM

## 2023-03-05 DIAGNOSIS — E871 Hypo-osmolality and hyponatremia: Secondary | ICD-10-CM | POA: Diagnosis not present

## 2023-03-05 DIAGNOSIS — Z885 Allergy status to narcotic agent status: Secondary | ICD-10-CM | POA: Diagnosis not present

## 2023-03-05 DIAGNOSIS — F32A Depression, unspecified: Secondary | ICD-10-CM | POA: Diagnosis present

## 2023-03-05 DIAGNOSIS — Z7982 Long term (current) use of aspirin: Secondary | ICD-10-CM

## 2023-03-05 DIAGNOSIS — Z905 Acquired absence of kidney: Secondary | ICD-10-CM

## 2023-03-05 DIAGNOSIS — Z9851 Tubal ligation status: Secondary | ICD-10-CM

## 2023-03-05 DIAGNOSIS — Z803 Family history of malignant neoplasm of breast: Secondary | ICD-10-CM

## 2023-03-05 DIAGNOSIS — Z7951 Long term (current) use of inhaled steroids: Secondary | ICD-10-CM

## 2023-03-05 DIAGNOSIS — G8929 Other chronic pain: Secondary | ICD-10-CM | POA: Diagnosis present

## 2023-03-05 DIAGNOSIS — Z79899 Other long term (current) drug therapy: Secondary | ICD-10-CM

## 2023-03-05 LAB — CBC
HCT: 39.5 % (ref 36.0–46.0)
Hemoglobin: 12.8 g/dL (ref 12.0–15.0)
MCH: 29.4 pg (ref 26.0–34.0)
MCHC: 32.4 g/dL (ref 30.0–36.0)
MCV: 90.6 fL (ref 80.0–100.0)
Platelets: 253 10*3/uL (ref 150–400)
RBC: 4.36 MIL/uL (ref 3.87–5.11)
RDW: 12.7 % (ref 11.5–15.5)
WBC: 5.9 10*3/uL (ref 4.0–10.5)
nRBC: 0 % (ref 0.0–0.2)

## 2023-03-05 LAB — BASIC METABOLIC PANEL
Anion gap: 8 (ref 5–15)
BUN: 8 mg/dL (ref 8–23)
CO2: 32 mmol/L (ref 22–32)
Calcium: 9.4 mg/dL (ref 8.9–10.3)
Chloride: 92 mmol/L — ABNORMAL LOW (ref 98–111)
Creatinine, Ser: 0.44 mg/dL (ref 0.44–1.00)
GFR, Estimated: >60 mL/min (ref >60)
Glucose, Bld: 88 mg/dL (ref 70–99)
Potassium: 4.6 mmol/L (ref 3.5–5.1)
Sodium: 132 mmol/L — ABNORMAL LOW (ref 135–145)

## 2023-03-05 LAB — TROPONIN I (HIGH SENSITIVITY)
Troponin I (High Sensitivity): 6 ng/L (ref <18)
Troponin I (High Sensitivity): 5 ng/L (ref <18)

## 2023-03-05 MED ORDER — PANTOPRAZOLE SODIUM 40 MG IV SOLR: 40.0000 mg | Freq: Two times a day (BID) | INTRAVENOUS | Status: DC

## 2023-03-05 MED ORDER — METHYLPREDNISOLONE SODIUM SUCC 125 MG IJ SOLR
125.0000 mg | Freq: Once | INTRAMUSCULAR | Status: AC
  Administered 2023-03-05: 125 mg via INTRAVENOUS
  Filled 2023-03-05: qty 2

## 2023-03-05 MED ORDER — PANTOPRAZOLE 80MG IVPB - SIMPLE MED
80.0000 mg | Freq: Once | INTRAVENOUS | Status: DC
  Filled 2023-03-05: qty 100

## 2023-03-05 MED ORDER — IPRATROPIUM-ALBUTEROL 0.5-2.5 (3) MG/3ML IN SOLN
3.0000 mL | Freq: Once | RESPIRATORY_TRACT | Status: AC
  Administered 2023-03-05: 3 mL via RESPIRATORY_TRACT
  Filled 2023-03-05: qty 3

## 2023-03-05 MED ORDER — DIAZEPAM 5 MG PO TABS
5.0000 mg | ORAL_TABLET | Freq: Once | ORAL | Status: AC
  Administered 2023-03-05: 5 mg via ORAL
  Filled 2023-03-05: qty 1

## 2023-03-05 MED ORDER — PANTOPRAZOLE INFUSION (NEW) - SIMPLE MED
8.0000 mg/h | INTRAVENOUS | Status: DC
  Filled 2023-03-05: qty 100

## 2023-03-05 NOTE — ED Triage Notes (Signed)
Patient c/o squeezing chest pain that started yesterday. Radiates down left arm.   Wears 2L O2 as needed

## 2023-03-05 NOTE — ED Provider Notes (Signed)
[facility] Provider Note    Event Date/Time   First MD Initiated Contact with Patient 03/05/23 1919     (approximate)   History   Chest Pain   HPI  Colleen [name] is a 62 y.o. 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  No recent steroids or antibiotics.  Having frequent coughing spells.  Feels like her meds at home are not giving much lasting relief.  Having worsening exertional dyspnea.     Physical Exam   Triage Vital Signs: ED Triage Vitals  Enc Vitals Group     BP 03/05/23 1609 (!) 146/96     Pulse Rate 03/05/23 1609 (!) 103     Resp 03/05/23 1609 (!) 22     Temp 03/05/23 1609 98.4 F (36.9 C)     Temp Source 03/05/23 1609 Oral     SpO2 03/05/23 1609 94 %     Weight 03/05/23 1610 108 lb (49 kg)     Height 03/05/23 1610 5' (1.524 m)     Head Circumference --      Peak Flow --      Pain Score 03/05/23 1610 10     Pain Loc --      Pain Edu? --      Excl. in [place]? --     Most recent vital signs: Vitals:   03/05/23 1609  BP: (!) 146/96  Pulse: (!) 103  Resp: (!) 22  Temp: 98.4 F (36.9 C)  SpO2: 94%     Constitutional: Alert  Eyes: Conjunctivae are normal.  Head: Atraumatic. Nose: No congestion/rhinnorhea. Mouth/Throat: Mucous membranes are moist.   Neck: Painless ROM.  Cardiovascular:   Good peripheral circulation. Respiratory: Tachypnea with use of accessory muscles and prolonged expiratory phase with very minimal expiratory air movement. Gastrointestinal: Soft and nontender.  Musculoskeletal:  no deformity Neurologic:  MAE spontaneously. No gross focal neurologic deficits are appreciated.  Skin:  Skin is warm, dry and intact. No rash noted. Psychiatric: Mood and affect are anxious    ED Results / Procedures / Treatments   Labs (all labs ordered are listed, but only abnormal results are displayed) Labs  Reviewed  BASIC METABOLIC PANEL - Abnormal; Notable for the following components:      Result Value   Sodium 132 (*)    Chloride 92 (*)    All other components within normal limits  CBC  TROPONIN I (HIGH SENSITIVITY)  TROPONIN I (HIGH SENSITIVITY)     EKG  ED ECG REPORT I, Willy Eddy, the attending physician, personally viewed and interpreted this ECG.   Date: 03/05/2023  EKG Time: 16:06  Rate: 105  Rhythm: sinus  Axis: right  Intervals: normal  ST&T Change: no stemi    RADIOLOGY Please see ED Course for my review and interpretation.  I personally reviewed all radiographic images ordered to evaluate for the above acute complaints and reviewed radiology reports and findings.  These findings were personally discussed with the patient.  Please see medical record for radiology report.    PROCEDURES:  Critical Care performed: No  Procedures   MEDICATIONS ORDERED IN ED: Medications  pantoprazole (PROTONIX) 80 mg /NS 100 mL IVPB (has no administration in time range)  pantoprozole (PROTONIX) 80 mg /NS 100 mL infusion (has no administration in time range)  pantoprazole (PROTONIX) injection 40 mg (has no administration in time range)  methylPREDNISolone sodium succinate (SOLU-MEDROL) 125 mg/2 mL injection 125 mg (has no administration in time range)  ipratropium-albuterol (DUONEB) 0.5-2.5 (3) MG/3ML nebulizer solution 3 mL (has no administration in time range)  ipratropium-albuterol (DUONEB) 0.5-2.5 (3) MG/3ML nebulizer solution 3 mL (3 mLs Nebulization Given 03/05/23 1946)  ipratropium-albuterol (DUONEB) 0.5-2.5 (3) MG/3ML nebulizer solution 3 mL (3 mLs Nebulization Given 03/05/23 1946)  diazepam (VALIUM) tablet 5 mg (5 mg Oral Given 03/05/23 1945)     IMPRESSION / MDM / ASSESSMENT AND PLAN / ED COURSE  I reviewed the triage vital signs and the nursing notes.                              Differential diagnosis includes, but is not limited to, Asthma, copd, CHF,  pna, ptx, malignancy, Pe, anemia  Patient presenting to the ER for evaluation of symptoms as described above.  Based on symptoms, risk factors and considered above differential, this presenting complaint could reflect a potentially life-threatening illness therefore the patient will be placed on continuous pulse oximetry and telemetry for monitoring.  Laboratory evaluation will be sent to evaluate for the above complaints.  Patient does appear to be having a component of acute COPD exacerbation will get a laser treatments but also very anxious therefore we will give some Valium.  Chest x-ray on my review and interpretation without evidence of consolidation or edema.   Clinical Course as of 03/05/23 2230  Thu Mar 05, 2023  1952 Patient with some improvement after nebulizer treatment as well as Valium.  I do suspect a component of both significant COPD but also large component of anxiety.  Will continue observe. [PR]  2229 Patient with worsening respiratory status again.  Will give additional nebulizer treatments.  Found to be hypoxic to the mid 80s on her home O2.  Will give Solu-Medrol.  Based on her presentation do feel she will require hospitalization for further management.  Patient agreeable plan.  Will consult hospitalist for admission. [PR]    Clinical Course User Index [PR] Willy Eddy, MD     FINAL CLINICAL IMPRESSION(S) / ED DIAGNOSES   Final diagnoses:  Acute on chronic respiratory failure with hypoxia (HCC)  COPD with acute exacerbation (HCC)     Rx / DC Orders   ED Discharge Orders     None        Note:  This document was prepared using Dragon voice recognition software and may include unintentional dictation errors.    Willy Eddy, MD 03/05/23 2230

## 2023-03-06 ENCOUNTER — Encounter: Payer: Self-pay | Admitting: Internal Medicine

## 2023-03-06 DIAGNOSIS — R0602 Shortness of breath: Secondary | ICD-10-CM | POA: Diagnosis present

## 2023-03-06 DIAGNOSIS — J441 Chronic obstructive pulmonary disease with (acute) exacerbation: Secondary | ICD-10-CM | POA: Diagnosis present

## 2023-03-06 DIAGNOSIS — Z853 Personal history of malignant neoplasm of breast: Secondary | ICD-10-CM | POA: Diagnosis not present

## 2023-03-06 DIAGNOSIS — J9621 Acute and chronic respiratory failure with hypoxia: Secondary | ICD-10-CM | POA: Diagnosis present

## 2023-03-06 DIAGNOSIS — Z1152 Encounter for screening for COVID-19: Secondary | ICD-10-CM | POA: Diagnosis not present

## 2023-03-06 DIAGNOSIS — M7918 Myalgia, other site: Secondary | ICD-10-CM | POA: Diagnosis present

## 2023-03-06 DIAGNOSIS — M549 Dorsalgia, unspecified: Secondary | ICD-10-CM | POA: Diagnosis present

## 2023-03-06 DIAGNOSIS — Z9012 Acquired absence of left breast and nipple: Secondary | ICD-10-CM | POA: Diagnosis not present

## 2023-03-06 DIAGNOSIS — Z87891 Personal history of nicotine dependence: Secondary | ICD-10-CM | POA: Diagnosis not present

## 2023-03-06 DIAGNOSIS — J439 Emphysema, unspecified: Secondary | ICD-10-CM | POA: Diagnosis present

## 2023-03-06 DIAGNOSIS — Z923 Personal history of irradiation: Secondary | ICD-10-CM | POA: Diagnosis not present

## 2023-03-06 DIAGNOSIS — F4310 Post-traumatic stress disorder, unspecified: Secondary | ICD-10-CM | POA: Diagnosis present

## 2023-03-06 DIAGNOSIS — Z880 Allergy status to penicillin: Secondary | ICD-10-CM | POA: Diagnosis not present

## 2023-03-06 DIAGNOSIS — Z9851 Tubal ligation status: Secondary | ICD-10-CM | POA: Diagnosis not present

## 2023-03-06 DIAGNOSIS — Z905 Acquired absence of kidney: Secondary | ICD-10-CM | POA: Diagnosis not present

## 2023-03-06 DIAGNOSIS — I429 Cardiomyopathy, unspecified: Secondary | ICD-10-CM | POA: Diagnosis present

## 2023-03-06 DIAGNOSIS — Z881 Allergy status to other antibiotic agents status: Secondary | ICD-10-CM | POA: Diagnosis not present

## 2023-03-06 DIAGNOSIS — E871 Hypo-osmolality and hyponatremia: Secondary | ICD-10-CM | POA: Diagnosis present

## 2023-03-06 DIAGNOSIS — Z888 Allergy status to other drugs, medicaments and biological substances status: Secondary | ICD-10-CM | POA: Diagnosis not present

## 2023-03-06 DIAGNOSIS — Z803 Family history of malignant neoplasm of breast: Secondary | ICD-10-CM | POA: Diagnosis not present

## 2023-03-06 DIAGNOSIS — F32A Depression, unspecified: Secondary | ICD-10-CM | POA: Diagnosis present

## 2023-03-06 DIAGNOSIS — Z882 Allergy status to sulfonamides status: Secondary | ICD-10-CM | POA: Diagnosis not present

## 2023-03-06 DIAGNOSIS — Z9981 Dependence on supplemental oxygen: Secondary | ICD-10-CM | POA: Diagnosis not present

## 2023-03-06 DIAGNOSIS — Z885 Allergy status to narcotic agent status: Secondary | ICD-10-CM | POA: Diagnosis not present

## 2023-03-06 DIAGNOSIS — G8929 Other chronic pain: Secondary | ICD-10-CM | POA: Diagnosis present

## 2023-03-06 LAB — COMPREHENSIVE METABOLIC PANEL
ALT: 10 U/L (ref 0–44)
AST: 15 U/L (ref 15–41)
Albumin: 3.6 g/dL (ref 3.5–5.0)
Alkaline Phosphatase: 37 U/L — ABNORMAL LOW (ref 38–126)
Anion gap: 8 (ref 5–15)
BUN: 7 mg/dL — ABNORMAL LOW (ref 8–23)
CO2: 31 mmol/L (ref 22–32)
Calcium: 8.5 mg/dL — ABNORMAL LOW (ref 8.9–10.3)
Chloride: 91 mmol/L — ABNORMAL LOW (ref 98–111)
Creatinine, Ser: 0.42 mg/dL — ABNORMAL LOW (ref 0.44–1.00)
GFR, Estimated: >60 mL/min (ref >60)
Glucose, Bld: 115 mg/dL — ABNORMAL HIGH (ref 70–99)
Potassium: 4.1 mmol/L (ref 3.5–5.1)
Sodium: 130 mmol/L — ABNORMAL LOW (ref 135–145)
Total Bilirubin: 0.4 mg/dL (ref 0.3–1.2)
Total Protein: 6.6 g/dL (ref 6.5–8.1)

## 2023-03-06 LAB — CBC
HCT: 37.2 % (ref 36.0–46.0)
Hemoglobin: 11.9 g/dL — ABNORMAL LOW (ref 12.0–15.0)
MCH: 29.5 pg (ref 26.0–34.0)
MCHC: 32 g/dL (ref 30.0–36.0)
MCV: 92.1 fL (ref 80.0–100.0)
Platelets: 214 10*3/uL (ref 150–400)
RBC: 4.04 MIL/uL (ref 3.87–5.11)
RDW: 12.9 % (ref 11.5–15.5)
WBC: 6 10*3/uL (ref 4.0–10.5)
nRBC: 0 % (ref 0.0–0.2)

## 2023-03-06 LAB — RESPIRATORY PANEL BY PCR

## 2023-03-06 LAB — D-DIMER, QUANTITATIVE: D-Dimer, Quant: 0.31 ug/mL-FEU (ref 0.00–0.50)

## 2023-03-06 LAB — EXPECTORATED SPUTUM ASSESSMENT W GRAM STAIN, RFLX TO RESP C

## 2023-03-06 LAB — CBG MONITORING, ED: Glucose-Capillary: 166 mg/dL — ABNORMAL HIGH (ref 70–99)

## 2023-03-06 LAB — HIV ANTIBODY (ROUTINE TESTING W REFLEX): HIV Screen 4th Generation wRfx: NONREACTIVE

## 2023-03-06 MED ORDER — ACETAMINOPHEN 650 MG RE SUPP: 650.0000 mg | Freq: Four times a day (QID) | RECTAL | Status: DC | PRN

## 2023-03-06 MED ORDER — ONDANSETRON HCL 4 MG/2ML IJ SOLN: 4.0000 mg | Freq: Four times a day (QID) | INTRAMUSCULAR | Status: DC | PRN

## 2023-03-06 MED ORDER — CARVEDILOL 6.25 MG PO TABS
3.1250 mg | ORAL_TABLET | Freq: Two times a day (BID) | ORAL | Status: DC
  Administered 2023-03-06 – 2023-03-09 (×6): 3.125 mg via ORAL
  Filled 2023-03-06 (×6): qty 1

## 2023-03-06 MED ORDER — HYDROCODONE BIT-HOMATROP MBR 5-1.5 MG/5ML PO SOLN
5.0000 mL | Freq: Four times a day (QID) | ORAL | Status: DC | PRN
  Administered 2023-03-06 – 2023-03-09 (×8): 5 mL via ORAL
  Filled 2023-03-06 (×9): qty 5

## 2023-03-06 MED ORDER — ONDANSETRON HCL 4 MG PO TABS
4.0000 mg | ORAL_TABLET | Freq: Four times a day (QID) | ORAL | Status: DC | PRN
  Filled 2023-03-06 (×2): qty 1

## 2023-03-06 MED ORDER — PANTOPRAZOLE SODIUM 40 MG PO TBEC
40.0000 mg | DELAYED_RELEASE_TABLET | Freq: Two times a day (BID) | ORAL | Status: DC
  Administered 2023-03-06 – 2023-03-09 (×7): 40 mg via ORAL
  Filled 2023-03-06 (×7): qty 1

## 2023-03-06 MED ORDER — MONTELUKAST SODIUM 10 MG PO TABS
10.0000 mg | ORAL_TABLET | Freq: Every day | ORAL | Status: DC
  Administered 2023-03-06 – 2023-03-09 (×4): 10 mg via ORAL
  Filled 2023-03-06 (×4): qty 1

## 2023-03-06 MED ORDER — DICLOFENAC SODIUM 1 % EX GEL
2.0000 g | Freq: Three times a day (TID) | CUTANEOUS | Status: AC
  Administered 2023-03-06 – 2023-03-08 (×6): 2 g via TOPICAL
  Filled 2023-03-06: qty 100

## 2023-03-06 MED ORDER — IPRATROPIUM-ALBUTEROL 0.5-2.5 (3) MG/3ML IN SOLN
3.0000 mL | Freq: Four times a day (QID) | RESPIRATORY_TRACT | Status: DC
  Administered 2023-03-06 – 2023-03-07 (×8): 3 mL via RESPIRATORY_TRACT
  Filled 2023-03-06 (×8): qty 3

## 2023-03-06 MED ORDER — PREDNISONE 20 MG PO TABS
40.0000 mg | ORAL_TABLET | Freq: Every day | ORAL | Status: DC
  Administered 2023-03-07 – 2023-03-09 (×3): 40 mg via ORAL
  Filled 2023-03-06 (×3): qty 2

## 2023-03-06 MED ORDER — HEPARIN SODIUM (PORCINE) 5000 UNIT/ML IJ SOLN
5000.0000 [IU] | Freq: Three times a day (TID) | INTRAMUSCULAR | Status: DC
  Administered 2023-03-08: 5000 [IU] via SUBCUTANEOUS
  Filled 2023-03-06 (×4): qty 1

## 2023-03-06 MED ORDER — ASPIRIN 81 MG PO TBEC
81.0000 mg | DELAYED_RELEASE_TABLET | Freq: Every day | ORAL | Status: DC
  Administered 2023-03-06 – 2023-03-09 (×4): 81 mg via ORAL
  Filled 2023-03-06 (×4): qty 1

## 2023-03-06 MED ORDER — ACETAMINOPHEN 325 MG PO TABS
650.0000 mg | ORAL_TABLET | Freq: Four times a day (QID) | ORAL | Status: DC | PRN
  Administered 2023-03-06 – 2023-03-08 (×4): 650 mg via ORAL
  Filled 2023-03-06 (×4): qty 2

## 2023-03-06 MED ORDER — FLUTICASONE FUROATE-VILANTEROL 100-25 MCG/ACT IN AEPB
1.0000 | INHALATION_SPRAY | Freq: Every day | RESPIRATORY_TRACT | Status: DC
  Administered 2023-03-07 – 2023-03-09 (×3): 1 via RESPIRATORY_TRACT
  Filled 2023-03-06: qty 28

## 2023-03-06 MED ORDER — BENZONATATE 100 MG PO CAPS
200.0000 mg | ORAL_CAPSULE | Freq: Three times a day (TID) | ORAL | Status: DC | PRN
  Administered 2023-03-06 – 2023-03-08 (×7): 200 mg via ORAL
  Filled 2023-03-06 (×7): qty 2

## 2023-03-06 MED ORDER — METHYLPREDNISOLONE SODIUM SUCC 40 MG IJ SOLR
40.0000 mg | Freq: Two times a day (BID) | INTRAMUSCULAR | Status: AC
  Administered 2023-03-06 (×2): 40 mg via INTRAVENOUS
  Filled 2023-03-06 (×2): qty 1

## 2023-03-06 MED ORDER — ALBUTEROL SULFATE (2.5 MG/3ML) 0.083% IN NEBU
2.5000 mg | INHALATION_SOLUTION | RESPIRATORY_TRACT | Status: DC | PRN
  Administered 2023-03-06: 2.5 mg via RESPIRATORY_TRACT
  Filled 2023-03-06: qty 3

## 2023-03-06 MED ORDER — LORAZEPAM 0.5 MG PO TABS
0.5000 mg | ORAL_TABLET | Freq: Two times a day (BID) | ORAL | Status: DC | PRN
  Administered 2023-03-06 – 2023-03-09 (×5): 0.5 mg via ORAL
  Filled 2023-03-06 (×6): qty 1

## 2023-03-06 MED ORDER — DOXYCYCLINE HYCLATE 100 MG PO TABS
100.0000 mg | ORAL_TABLET | Freq: Two times a day (BID) | ORAL | Status: DC
  Administered 2023-03-06 – 2023-03-09 (×8): 100 mg via ORAL
  Filled 2023-03-06 (×8): qty 1

## 2023-03-06 NOTE — ED Notes (Signed)
Pt ambulatory to restroom

## 2023-03-06 NOTE — H&P (Signed)
History and Physical    Colleen [name] ZOX:[id number] DOB: 03-25-1961 DOA: 03/05/2023  PCP: Armando Gang, FNP  Patient coming from:  home   I have personally briefly reviewed patient's old medical records in [facility]  Chief Complaint: sob Jonni Sanger pain x 1 day  HPI: Colleen [name] is a 62 y.o. female with medical history significant of anxiety/depression , Asthma/COPD on chronic home O2,, L BRCA in remission, Chronic back pain , PTSD, Myofascial pain syndrome . Atypical chest pain , CMY ef 40% noted to be stress induced, who presents to ED with worsening sob x 1 week  and progressive sob and chest pain x 1 day despite increase use of inhalers.Patient describe pain as a sharp pulling pressure on the left, with radiation  to side and back. Patient currently ,notes no pain. She notes he sob much improved. She notes pain made worse with moving around . She notes pain unchanged related to breathing. She also endorse history of uri for which she completed abx two weeks ago. She also endorses continued tobacco abuse and notes her last cigarette was one day ago .  ED Course:  Afeb, bp 146/96, HR 103, rr 22 sat 94% on ra  Labs: Wbc 5.9, K 12.8,  plt 253 Na  132,  K 4.6, CL 92 , cr 0.44 Ce 6  Cxr IMPRESSION: Hyperinflation with chronic changes. No acute cardiopulmonary Disease  Tx valium 5mg  , douneb x 3 , solumedrol  125   Review of Systems: As per HPI otherwise 10 point review of systems negative.   Past Medical History:  Diagnosis Date   Anxiety    Asthma    Breast cancer (HCC) 2004   left breast   Chronic back pain    COPD (chronic obstructive pulmonary disease) (HCC)    Depression    History of kidney surgery    Personal history of radiation therapy    PTSD (post-traumatic stress disorder)     Past Surgical History:  Procedure Laterality Date   BREAST BIOPSY Left 2004   positive   BREAST BIOPSY Right    neg   BREAST LUMPECTOMY Left 2004   positive   COLONOSCOPY  WITH PROPOFOL N/A 06/21/2022   Procedure: COLONOSCOPY WITH PROPOFOL;  Surgeon: Toney Reil, MD;  Location: [facility] ENDOSCOPY;  Service: Gastroenterology;  Laterality: N/A;   ESOPHAGOGASTRODUODENOSCOPY (EGD) WITH PROPOFOL N/A 06/21/2022   Procedure: ESOPHAGOGASTRODUODENOSCOPY (EGD) WITH PROPOFOL;  Surgeon: Toney Reil, MD;  Location: [facility] ENDOSCOPY;  Service: Gastroenterology;  Laterality: N/A;   LEFT HEART CATH AND CORONARY ANGIOGRAPHY N/A 11/06/2021   Procedure: LEFT HEART CATH AND CORONARY ANGIOGRAPHY;  Surgeon: Lamar Blinks, MD;  Location: [facility] INVASIVE CV LAB;  Service: Cardiovascular;  Laterality: N/A;   MASTECTOMY Left    MASTECTOMY     NEPHRECTOMY Left    TUBAL LIGATION       reports that she has been smoking cigarettes. She has been smoking an average of .5 packs per day. She has never been exposed to tobacco smoke. She has never used smokeless tobacco. She reports current drug use. She reports that she does not drink alcohol.  Allergies  Allergen Reactions   Desvenlafaxine Anaphylaxis   Morphine And Related Anaphylaxis   Penicillins Anaphylaxis    Has patient had a PCN reaction causing immediate rash, facial/tongue/throat swelling, SOB or lightheadedness with hypotension: Yes Has patient had a PCN reaction causing severe rash involving mucus membranes or skin necrosis: No Has patient had a  PCN reaction that required hospitalization: Unknown Has patient had a PCN reaction occurring within the last 10 years: No If all of the above answers are "NO", then may proceed with Cephalosporin use.    Prazosin Other (See Comments)   Tamoxifen Anaphylaxis   Trazodone Anaphylaxis   Clonazepam    Codeine Swelling   Duloxetine Itching   Gabapentin Hives    Rash and swelling.    Hydroxyzine Itching   Lorazepam     Patient feels this medications makes her to sedated and wants to avoid   Paroxetine Hcl Hives   Sulfa Antibiotics Itching   Cephalexin Rash    Family  History  Problem Relation Age of Onset   Breast cancer Cousin    Breast cancer Cousin    Bipolar disorder Daughter    Drug abuse Daughter    Alcohol abuse Maternal Grandfather     Prior to Admission medications   Medication Sig Start Date End Date Taking? Authorizing Provider  albuterol (VENTOLIN HFA) 108 (90 Base) MCG/ACT inhaler Inhale 1-2 puffs into the lungs every 4 (four) hours as needed. 07/17/22   Enedina Finner, MD  albuterol (VENTOLIN HFA) 108 (90 Base) MCG/ACT inhaler Inhale 2 puffs into the lungs every 6 (six) hours as needed for wheezing or shortness of breath. 11/21/22   Viviano Simas, FNP  aspirin EC 81 MG tablet Take 1 tablet (81 mg total) by mouth daily. Swallow whole. 07/18/22   Enedina Finner, MD  carvedilol (COREG) 3.125 MG tablet Take 1 tablet (3.125 mg total) by mouth 2 (two) times daily with a meal. 07/17/22   Enedina Finner, MD  cholecalciferol (VITAMIN D3) 25 MCG (1000 UNIT) tablet Take 1,000 Units by mouth daily.    [provider]  denosumab (PROLIA) 60 MG/ML SOSY injection Inject 60 mg into the skin every 6 (six) months.    [provider]  doxycycline (VIBRA-TABS) 100 MG tablet Take 1 tablet (100 mg total) by mouth 2 (two) times daily. 09/29/22   Margaretann Loveless, PA-C  EPINEPHrine 0.3 mg/0.3 mL IJ SOAJ injection Inject 0.3 mg into the muscle as needed for anaphylaxis.    [provider]  FLUoxetine (PROZAC) 40 MG capsule Take 1 capsule (40 mg total) by mouth daily. 07/17/22   Enedina Finner, MD  fluticasone (FLONASE) 50 MCG/ACT nasal spray Place 1 spray into both nostrils 2 (two) times daily. 05/23/22   [provider]  Fluticasone-Umeclidin-Vilant (TRELEGY ELLIPTA) 100-62.5-25 MCG/INH AEPB Inhale 1 puff into the lungs daily.    [provider]  hydrOXYzine (ATARAX) 50 MG tablet Take 1 tablet (50 mg total) by mouth 3 (three) times daily as needed. 07/17/22   Enedina Finner, MD  ipratropium (ATROVENT) 0.02 % nebulizer solution Take 0.5 mg by  nebulization 4 (four) times daily.    [provider]  ipratropium (ATROVENT) 0.03 % nasal spray Place 2 sprays into both nostrils every 12 (twelve) hours. 09/29/22   Margaretann Loveless, PA-C  methocarbamol (ROBAXIN) 750 MG tablet Take 1 tablet (750 mg total) by mouth 4 (four) times daily. 06/21/22   Danford, Earl Lites, MD  montelukast (SINGULAIR) 10 MG tablet Take 10 mg by mouth daily. 06/07/22   [provider]  pantoprazole (PROTONIX) 40 MG tablet Take 1 tablet (40 mg total) by mouth 2 (two) times daily before a meal. 06/21/22   Danford, Earl Lites, MD  vitamin B-12 (CYANOCOBALAMIN) 500 MCG tablet Take 500 mcg by mouth daily.    [provider]  Physical Exam: Vitals:   03/05/23 1609 03/05/23 1610  BP: (!) 146/96   Pulse: (!) 103   Resp: (!) 22   Temp: 98.4 F (36.9 C)   TempSrc: Oral   SpO2: 94%   Weight:  49 kg  Height:  5' (1.524 m)    Constitutional: NAD, calm, comfortable Vitals:   03/05/23 1609 03/05/23 1610  BP: (!) 146/96   Pulse: (!) 103   Resp: (!) 22   Temp: 98.4 F (36.9 C)   TempSrc: Oral   SpO2: 94%   Weight:  49 kg  Height:  5' (1.524 m)   Eyes: PERRL, lids and conjunctivae normal ENMT: Mucous membranes are moist. Posterior pharynx clear of any exudate or lesions Neck: normal, supple, no masses, no thyromegaly Respiratory: clear to auscultation bilaterally, no wheezing, no crackles. Normal respiratory effort. No accessory muscle use.  Cardiovascular: Regular rate and rhythm, no murmurs / rubs / gallops. No extremity edema. 2+ pedal pulses.  Abdomen: no tenderness, no masses palpated. No hepatosplenomegaly. Bowel sounds positive.  Musculoskeletal: no clubbing / cyanosis. No joint deformity upper and lower extremities. Good ROM, no contractures. Normal muscle tone.  Skin: no rashes, lesions, ulcers. No induration Neurologic: CN 2-12 grossly intact. Sensation intact, Strength 5/5 in all 4.  Psychiatric: Normal judgment and  insight. Alert and oriented x 3. Normal mood.    Labs on Admission: I have personally reviewed following labs and imaging studies  CBC: Recent Labs  Lab 03/05/23 1612  WBC 5.9  HGB 12.8  HCT 39.5  MCV 90.6  PLT 253   Basic Metabolic Panel: Recent Labs  Lab 03/05/23 1612  NA 132*  K 4.6  CL 92*  CO2 32  GLUCOSE 88  BUN 8  CREATININE 0.44  CALCIUM 9.4   GFR: Estimated Creatinine Clearance: 52.4 mL/min (by C-G formula based on SCr of 0.44 mg/dL). Liver Function Tests: No results for input(s): "AST", "ALT", "ALKPHOS", "BILITOT", "PROT", "ALBUMIN" in the last 168 hours. No results for input(s): "LIPASE", "AMYLASE" in the last 168 hours. No results for input(s): "AMMONIA" in the last 168 hours. Coagulation Profile: No results for input(s): "INR", "PROTIME" in the last 168 hours. Cardiac Enzymes: No results for input(s): "CKTOTAL", "CKMB", "CKMBINDEX", "TROPONINI" in the last 168 hours. BNP (last 3 results) No results for input(s): "PROBNP" in the last 8760 hours. HbA1C: No results for input(s): "HGBA1C" in the last 72 hours. CBG: No results for input(s): "GLUCAP" in the last 168 hours. Lipid Profile: No results for input(s): "CHOL", "HDL", "LDLCALC", "TRIG", "CHOLHDL", "LDLDIRECT" in the last 72 hours. Thyroid Function Tests: No results for input(s): "TSH", "T4TOTAL", "FREET4", "T3FREE", "THYROIDAB" in the last 72 hours. Anemia Panel: No results for input(s): "VITAMINB12", "FOLATE", "FERRITIN", "TIBC", "IRON", "RETICCTPCT" in the last 72 hours. Urine analysis:    Component Value Date/Time   COLORURINE YELLOW (A) 07/16/2022 0246   APPEARANCEUR CLEAR (A) 07/16/2022 0246   LABSPEC >1.046 (H) 07/16/2022 0246   PHURINE 7.0 07/16/2022 0246   GLUCOSEU NEGATIVE 07/16/2022 0246   HGBUR NEGATIVE 07/16/2022 0246   BILIRUBINUR NEGATIVE 07/16/2022 0246   KETONESUR 5 (A) 07/16/2022 0246   PROTEINUR NEGATIVE 07/16/2022 0246   NITRITE NEGATIVE 07/16/2022 0246   LEUKOCYTESUR  NEGATIVE 07/16/2022 0246    Radiological Exams on Admission: DG Chest 2 View  Result Date: 03/05/2023 CLINICAL DATA:  Cough and shortness of breath.  COPD EXAM: CHEST - 2 VIEW COMPARISON:  X-ray 01/02/2023 FINDINGS: No consolidation, pneumothorax or effusion. Normal cardiopericardial silhouette without  edema. Chronic interstitial changes. Degenerative changes of the spine. Hyperinflation. IMPRESSION: Hyperinflation with chronic changes. No acute cardiopulmonary disease Electronically Signed   By: Karen Kays M.D.   On: 03/05/2023 16:35    EKG: Independently reviewed.   Assessment/Plan  Acute COPD exacerbation on chronic home O2 -admit to progressive care  - solumedrol iv , bid taper to  prednisone daily per protocol - doxcycline  -f/u on sputum cultures  -consider further imaging of chest if patient does not improve -cxr : NAD - nebs standing and prn  -resume chronic inhalers  -pulmonary toilet  -wean O2 back to baseline line as able    Anxiety/depression /PTSD -resume home regimen   L BRCA -in remission    Myofascial pain syndrome/Chronic pain syndrome -followed by pain management -resume home therapy as able   CMY ef 40% stress induced -follow with dr Dwaine Deter -continue low dose bb    DVT prophylaxis: heparin Code Status:  full/ as discussed per patient wishes in event of cardiac arrest  Family Communication: none at bedside Disposition Plan: patient  expected to be admitted greater than 2 midnights  Consults called: n/a Admission status: progressive   Lurline Del MD [facility]   If 7PM-7AM, please contact night-coverage [email or web address] Password [facility]  03/06/2023, 12:11 AM

## 2023-03-06 NOTE — ED Notes (Signed)
Pt requesting for cardiac monitor to be removed. Cardiac monitoring was discontinued by Dareen Piano, MD. Cardiac monitor removed at this time.

## 2023-03-07 DIAGNOSIS — J9621 Acute and chronic respiratory failure with hypoxia: Secondary | ICD-10-CM

## 2023-03-07 DIAGNOSIS — J441 Chronic obstructive pulmonary disease with (acute) exacerbation: Secondary | ICD-10-CM

## 2023-03-07 MED ORDER — OXYCODONE-ACETAMINOPHEN 5-325 MG PO TABS
1.0000 | ORAL_TABLET | ORAL | Status: DC | PRN
  Administered 2023-03-07 – 2023-03-09 (×5): 1 via ORAL
  Filled 2023-03-07 (×6): qty 1

## 2023-03-07 MED ORDER — HYDROXYZINE HCL 25 MG PO TABS
50.0000 mg | ORAL_TABLET | Freq: Three times a day (TID) | ORAL | Status: DC
  Administered 2023-03-07 – 2023-03-09 (×7): 50 mg via ORAL
  Filled 2023-03-07 (×7): qty 2

## 2023-03-07 MED ORDER — SODIUM CHLORIDE 0.9 % IV SOLN
6.2500 mg | INTRAVENOUS | Status: AC
  Administered 2023-03-07 (×2): 6.25 mg via INTRAVENOUS
  Filled 2023-03-07 (×4): qty 0.25

## 2023-03-07 MED ORDER — SODIUM CHLORIDE 0.9 % IV SOLN
6.2500 mg | Freq: Four times a day (QID) | INTRAVENOUS | Status: DC | PRN
  Administered 2023-03-08: 6.25 mg via INTRAVENOUS
  Filled 2023-03-07: qty 0.25

## 2023-03-07 NOTE — Progress Notes (Signed)
PROGRESS NOTE  Colleen [name]    DOB: 1961-07-01, 62 y.o.  ZOX:[id number]    Code Status: Full Code   DOA: 03/05/2023   LOS: 1   Brief hospital course  Colleen [name] is a 62 y.o. female with a PMH significant for anxiety/depression , Asthma/COPD on PRN O2 of 2-3L at home, h/o L BRCA in remission, Chronic back pain, PTSD, Myofascial pain syndrome .  They presented from home to the ED on 03/05/2023 with SOB and back pain worsening x several days. She states that she has been wearing her home oxygen consistently for 2 weeks at 2-3L but normally doesn't wear it continuously. Her back pain is treated with tylenol at baseline.   In the ED, it was found that they had Afeb, bp 146/96, HR 103, rr 22 sat 94% ORA.  Significant findings included WBC 5.9, Hgb 12.8, platelets 253, troponin negative x 2, viral respiratory path negative, D-dimer negative, Na+ 130, K+ 4.1, CR 0.42. Chest x-ray positive for hyperinflation with chronic changes but no acute cardiopulmonary disease or consolidation.  They were initially treated with anxiolytics, home medications, breathing treatments.   Patient was admitted to medicine service for further workup and management of shortness of breath and back pain as outlined in detail below.  03/07/23 -stable  Assessment & Plan  Active Problems:   Acute exacerbation of chronic obstructive pulmonary disease (COPD) (HCC)  Acute COPD exacerbation-inconsistent home oxygen use but has been on 2 to 3 L for the past couple of weeks. -Continue p.o. steroids -Continue doxycycline -Continue breathing treatments as scheduled and as needed -TOC engaged for home oxygen  Anxiety/depression  PTSD -resume home regimen of Atarax  Chronic back pain -unchanged from baseline per patient complains of severe pain refractory to her home medications of Tylenol -Heating pad -Voltaren gel -Percocet as needed   L BRCA -in remission    Cardiomyopathy -Continue carvedilol  Body mass  index is 21.23 kg/m.  VTE ppx: heparin injection 5,000 Units Start: 03/06/23 0100  Diet:     Diet   Diet Heart Room service appropriate? Yes; Fluid consistency: Thin   Consultants: None  Subjective 03/07/23    Pt reports having severe back pain from her neck down to her hips.  She states this is unchanged from her baseline.  Endorses having nausea from the pain.  She feels anxious and request that her home Atarax be represcribed at 50 mg 3 times daily.  She states that her respiratory status feels at its baseline.   Objective   Vitals:   03/07/23 0248 03/07/23 0412 03/07/23 0414 03/07/23 0416  BP:  129/89    Pulse:  (!) 101 99   Resp:  20    Temp:  98.2 F (36.8 C)    TempSrc:      SpO2: 95% 92% 94%   Weight:    49.3 kg  Height:        Intake/Output Summary (Last 24 hours) at 03/07/2023 0748 Last data filed at 03/07/2023 0228 Gross per 24 hour  Intake 240 ml  Output 600 ml  Net -360 ml   Filed Weights   03/05/23 1610 03/07/23 0416  Weight: 49 kg 49.3 kg     Physical Exam:  General: awake, alert, in mild distress HEENT: atraumatic, clear conjunctiva, anicteric sclera, MMM, hearing grossly normal Respiratory: normal respiratory effort.  CTAB Cardiovascular: quick capillary refill, normal S1/S2, RRR, no JVD, murmurs Nervous: A&O x3. no gross focal neurologic deficits, normal speech Extremities: moves all  equally, no edema, normal tone Skin: dry, intact, normal temperature, normal color. No rashes, lesions or ulcers on exposed skin Psychiatry: normal mood, congruent affect  Labs   I have personally reviewed the following labs and imaging studies CBC    Component Value Date/Time   WBC 6.0 03/06/2023 0118   RBC 4.04 03/06/2023 0118   HGB 11.9 (L) 03/06/2023 0118   HCT 37.2 03/06/2023 0118   PLT 214 03/06/2023 0118   MCV 92.1 03/06/2023 0118   MCH 29.5 03/06/2023 0118   MCHC 32.0 03/06/2023 0118   RDW 12.9 03/06/2023 0118   LYMPHSABS 1.3 01/02/2023 1010    MONOABS 0.5 01/02/2023 1010   EOSABS 0.1 01/02/2023 1010   BASOSABS 0.1 01/02/2023 1010      Latest Ref Rng & Units 03/06/2023    1:18 AM 03/05/2023    4:12 PM 01/02/2023   10:10 AM  BMP  Glucose 70 - 99 mg/dL 981  88  95   BUN 8 - 23 mg/dL 7  8  6    Creatinine 0.44 - 1.00 mg/dL 1.91  4.78  2.95   Sodium 135 - 145 mmol/L 130  132  126   Potassium 3.5 - 5.1 mmol/L 4.1  4.6  4.2   Chloride 98 - 111 mmol/L 91  92  89   CO2 22 - 32 mmol/L 31  32  26   Calcium 8.9 - 10.3 mg/dL 8.5  9.4  8.9     DG Chest 2 View  Result Date: 03/05/2023 CLINICAL DATA:  Cough and shortness of breath.  COPD EXAM: CHEST - 2 VIEW COMPARISON:  X-ray 01/02/2023 FINDINGS: No consolidation, pneumothorax or effusion. Normal cardiopericardial silhouette without edema. Chronic interstitial changes. Degenerative changes of the spine. Hyperinflation. IMPRESSION: Hyperinflation with chronic changes. No acute cardiopulmonary disease Electronically Signed   By: Karen Kays M.D.   On: 03/05/2023 16:35    Disposition Plan & Communication  Patient status: Inpatient  Admitted From: Home Planned disposition location: Home Anticipated discharge date: 4/28 pending pain control  Family Communication: None at bedside   Author: Leeroy Bock, DO [facility] 03/07/2023, 7:48 AM   Available by Epic secure chat 7AM-7PM. If 7PM-7AM, please contact night-coverage.  [facility] contact information found on [email or web address].

## 2023-03-08 DIAGNOSIS — M549 Dorsalgia, unspecified: Secondary | ICD-10-CM

## 2023-03-08 DIAGNOSIS — J441 Chronic obstructive pulmonary disease with (acute) exacerbation: Secondary | ICD-10-CM | POA: Diagnosis not present

## 2023-03-08 DIAGNOSIS — G8929 Other chronic pain: Secondary | ICD-10-CM

## 2023-03-08 DIAGNOSIS — J9621 Acute and chronic respiratory failure with hypoxia: Secondary | ICD-10-CM | POA: Diagnosis not present

## 2023-03-08 LAB — BASIC METABOLIC PANEL
Anion gap: 8 (ref 5–15)
BUN: 17 mg/dL (ref 8–23)
CO2: 30 mmol/L (ref 22–32)
Calcium: 9 mg/dL (ref 8.9–10.3)
Chloride: 87 mmol/L — ABNORMAL LOW (ref 98–111)
Creatinine, Ser: 0.46 mg/dL (ref 0.44–1.00)
GFR, Estimated: >60 mL/min (ref >60)
Glucose, Bld: 117 mg/dL — ABNORMAL HIGH (ref 70–99)
Potassium: 3.9 mmol/L (ref 3.5–5.1)
Sodium: 125 mmol/L — ABNORMAL LOW (ref 135–145)

## 2023-03-08 LAB — CBC
HCT: 34.1 % — ABNORMAL LOW (ref 36.0–46.0)
Hemoglobin: 11.4 g/dL — ABNORMAL LOW (ref 12.0–15.0)
MCH: 29.4 pg (ref 26.0–34.0)
MCHC: 33.4 g/dL (ref 30.0–36.0)
MCV: 87.9 fL (ref 80.0–100.0)
Platelets: 216 10*3/uL (ref 150–400)
RBC: 3.88 MIL/uL (ref 3.87–5.11)
RDW: 12.4 % (ref 11.5–15.5)
WBC: 6.3 10*3/uL (ref 4.0–10.5)
nRBC: 0 % (ref 0.0–0.2)

## 2023-03-08 MED ORDER — IPRATROPIUM-ALBUTEROL 0.5-2.5 (3) MG/3ML IN SOLN
3.0000 mL | Freq: Three times a day (TID) | RESPIRATORY_TRACT | Status: DC
  Administered 2023-03-08 – 2023-03-09 (×5): 3 mL via RESPIRATORY_TRACT
  Filled 2023-03-08 (×5): qty 3

## 2023-03-08 MED ORDER — SODIUM BICARBONATE 650 MG PO TABS
650.0000 mg | ORAL_TABLET | Freq: Once | ORAL | Status: AC
  Administered 2023-03-08: 650 mg via ORAL

## 2023-03-08 MED ORDER — SODIUM CHLORIDE 0.9 % IV SOLN: INTRAVENOUS | Status: AC

## 2023-03-08 NOTE — Progress Notes (Addendum)
8657 vitals taken by nurses students, no notification of vitals were made to this RN. After seeing vitals in flowsheets, this RN rechecked the patients vitals at 0825 and then proceeded to ask students the conditions under which vitals were checked. Per students, patient was receiving breathing treatment while getting vitals. Vitals continue to be stable at this time and not reflective of patient status.

## 2023-03-08 NOTE — Progress Notes (Signed)
PROGRESS NOTE  Colleen [name]    DOB: [birth year], 62 y.o.  YNW:[id number]    Code Status: Full Code   DOA: 03/05/2023   LOS: 2   Brief hospital course  Colleen [name] is a 62 y.o. female with a PMH significant for anxiety/depression , Asthma/COPD on PRN O2 of 2-3L at home, h/o L BRCA in remission, Chronic back pain, PTSD, Myofascial pain syndrome .  They presented from home to the ED on 03/05/2023 with SOB and back pain worsening x several days. She states that she has been wearing her home oxygen consistently for 2 weeks at 2-3L but normally doesn't wear it continuously. Her back pain is treated with tylenol at baseline.   In the ED, it was found that they had Afeb, bp 146/96, HR 103, rr 22 sat 94% ORA.  Significant findings included WBC 5.9, Hgb 12.8, platelets 253, troponin negative x 2, viral respiratory path negative, D-dimer negative, Na+ 130, K+ 4.1, CR 0.42. Chest x-ray positive for hyperinflation with chronic changes but no acute cardiopulmonary disease or consolidation.  They were initially treated with anxiolytics, home medications, breathing treatments.   Patient was admitted to medicine service for further workup and management of shortness of breath and back pain as outlined in detail below.  03/08/23 -stable  Assessment & Plan  Active Problems:   Acute exacerbation of chronic obstructive pulmonary disease (COPD) (HCC)   Acute on chronic respiratory failure with hypoxia (HCC)   COPD with acute exacerbation (HCC)  Acute COPD exacerbation-inconsistent home oxygen use but has been on 2 to 3 L for the past couple of weeks. -Continue p.o. steroids -Continue doxycycline -Continue breathing treatments as scheduled and as needed -TOC engaged for home oxygen -Ambulate with pulse ox.  Encouraged patient to be more mobile today  Anxiety/depression  PTSD -resume home regimen of Atarax  Chronic back pain -unchanged from baseline per patient complains of severe pain refractory to  her home medications of Tylenol -Heating pad -Voltaren gel -Percocet as needed -Increase activity level today   L BRCA -in remission    Cardiomyopathy -Continue carvedilol  Chronic hyponatremia-worsened today but still within her normal range. Na+ 132> 130> 125 -Short course of IV fluids -Sodium supplement x 1 -Change diet from a low sodium to regular -BMP a.m.  Body mass index is 21.1 kg/m.  VTE ppx: heparin injection 5,000 Units Start: 03/06/23 0100  Diet:     Diet   Diet Heart Room service appropriate? Yes; Fluid consistency: Thin   Consultants: None  Subjective 03/08/23    Pt reports feeling "terrible".  She states it is due to having a dry cough and chronic back pain.  She has no changes from her baseline status but continues to complain severely.  She has only been ambulating to the bathroom throughout her hospitalization.  I encouraged patient to get out of bed and ambulate more today and she agreed to try to be more active.  Given that she is at her baseline status, we discussed going home and she feels that she needs 1 more day.   Objective   Vitals:   03/07/23 1632 03/07/23 1930 03/07/23 2040 03/08/23 0457  BP: 129/86 115/88  129/78  Pulse: 79 77  69  Resp: 16 20  16   Temp: 97.6 F (36.4 C) 98.1 F (36.7 C)  98 F (36.7 C)  TempSrc: Oral Oral  Oral  SpO2: 97% 98% 97% 96%  Weight:    49 kg  Height:  Intake/Output Summary (Last 24 hours) at 03/08/2023 0733 Last data filed at 03/08/2023 0600 Gross per 24 hour  Intake 650 ml  Output 350 ml  Net 300 ml    Filed Weights   03/05/23 1610 03/07/23 0416 03/08/23 0457  Weight: 49 kg 49.3 kg 49 kg     Physical Exam:  General: awake, alert, in mild distress HEENT: atraumatic, clear conjunctiva, anicteric sclera, MMM, hearing grossly normal Respiratory: normal respiratory effort.  CTAB Cardiovascular: quick capillary refill, normal S1/S2, RRR, no JVD, murmurs Nervous: A&O x3. no gross focal  neurologic deficits, normal speech Extremities: moves all equally, no edema, normal tone Skin: dry, intact, normal temperature, normal color. No rashes, lesions or ulcers on exposed skin Psychiatry: normal mood, congruent affect  Labs   I have personally reviewed the following labs and imaging studies CBC    Component Value Date/Time   WBC 6.3 03/08/2023 0524   RBC 3.88 03/08/2023 0524   HGB 11.4 (L) 03/08/2023 0524   HCT 34.1 (L) 03/08/2023 0524   PLT 216 03/08/2023 0524   MCV 87.9 03/08/2023 0524   MCH 29.4 03/08/2023 0524   MCHC 33.4 03/08/2023 0524   RDW 12.4 03/08/2023 0524   LYMPHSABS 1.3 01/02/2023 1010   MONOABS 0.5 01/02/2023 1010   EOSABS 0.1 01/02/2023 1010   BASOSABS 0.1 01/02/2023 1010      Latest Ref Rng & Units 03/08/2023    5:24 AM 03/06/2023    1:18 AM 03/05/2023    4:12 PM  BMP  Glucose 70 - 99 mg/dL 960  454  88   BUN 8 - 23 mg/dL 17  7  8    Creatinine 0.44 - 1.00 mg/dL 0.98  1.19  1.47   Sodium 135 - 145 mmol/L 125  130  132   Potassium 3.5 - 5.1 mmol/L 3.9  4.1  4.6   Chloride 98 - 111 mmol/L 87  91  92   CO2 22 - 32 mmol/L 30  31  32   Calcium 8.9 - 10.3 mg/dL 9.0  8.5  9.4     No results found.  Disposition Plan & Communication  Patient status: Inpatient  Admitted From: Home Planned disposition location: Home Anticipated discharge date: 4/29 pending pain control  Family Communication: None at bedside   Author: Leeroy Bock, DO [facility] 03/08/2023, 7:33 AM   Available by Epic secure chat 7AM-7PM. If 7PM-7AM, please contact night-coverage.  [facility] contact information found on [email or web address].

## 2023-03-09 DIAGNOSIS — J441 Chronic obstructive pulmonary disease with (acute) exacerbation: Secondary | ICD-10-CM | POA: Diagnosis not present

## 2023-03-09 DIAGNOSIS — J9621 Acute and chronic respiratory failure with hypoxia: Secondary | ICD-10-CM | POA: Diagnosis not present

## 2023-03-09 LAB — BASIC METABOLIC PANEL
Anion gap: 6 (ref 5–15)
BUN: 24 mg/dL — ABNORMAL HIGH (ref 8–23)
CO2: 34 mmol/L — ABNORMAL HIGH (ref 22–32)
Calcium: 9.2 mg/dL (ref 8.9–10.3)
Chloride: 87 mmol/L — ABNORMAL LOW (ref 98–111)
Creatinine, Ser: 0.45 mg/dL (ref 0.44–1.00)
GFR, Estimated: >60 mL/min (ref >60)
Glucose, Bld: 85 mg/dL (ref 70–99)
Potassium: 4.4 mmol/L (ref 3.5–5.1)
Sodium: 127 mmol/L — ABNORMAL LOW (ref 135–145)

## 2023-03-09 MED ORDER — PREDNISONE 20 MG PO TABS: 40.0000 mg | ORAL_TABLET | Freq: Every day | ORAL | 0 refills | Status: AC

## 2023-03-09 MED ORDER — OXYCODONE-ACETAMINOPHEN 5-325 MG PO TABS: 1.0000 | ORAL_TABLET | ORAL | 0 refills | Status: DC | PRN

## 2023-03-09 MED ORDER — ONDANSETRON HCL 4 MG PO TABS: 4.0000 mg | ORAL_TABLET | Freq: Four times a day (QID) | ORAL | 0 refills | Status: AC | PRN

## 2023-03-09 MED ORDER — ONDANSETRON HCL 4 MG PO TABS: 4.0000 mg | ORAL_TABLET | Freq: Four times a day (QID) | ORAL | 0 refills | Status: DC | PRN

## 2023-03-09 MED ORDER — OXYCODONE-ACETAMINOPHEN 5-325 MG PO TABS: 1.0000 | ORAL_TABLET | ORAL | 0 refills | Status: AC | PRN

## 2023-03-09 MED ORDER — DOXYCYCLINE HYCLATE 100 MG PO TABS
100.0000 mg | ORAL_TABLET | Freq: Two times a day (BID) | ORAL | 0 refills | Status: AC
Start: 2023-03-09 — End: 2023-03-13

## 2023-03-09 MED ORDER — BENZONATATE 200 MG PO CAPS: 200.0000 mg | ORAL_CAPSULE | Freq: Three times a day (TID) | ORAL | 0 refills | Status: AC | PRN

## 2023-03-09 MED ORDER — BENZONATATE 200 MG PO CAPS: 200.0000 mg | ORAL_CAPSULE | Freq: Three times a day (TID) | ORAL | 0 refills | Status: DC | PRN

## 2023-03-09 NOTE — Discharge Instructions (Signed)
Please follow up with your PCP within 1-2 weeks to recheck your breathing status. I also recommend talking to them about your back pain.  Please wear 3L oxygen continuously

## 2023-03-09 NOTE — Progress Notes (Signed)
Primary RN ambulated patient while on oxygen; patient was 85% while on room air at rest; patient placed on 3 liters on oxygen via nasal canula; patient oxygen saturation on 3 liters at rest was 95%; patient ambulated around nursing station while on 3 liters on oxygen; patient maintained oxygen saturation between 91% and 82%; no shortness of breath noted while ambulating; patient tolerated well.

## 2023-03-09 NOTE — Discharge Summary (Signed)
Physician Discharge Summary  Patient: Colleen [name] NGE:[id number] DOB: 12/22/1960   Code Status: Full Code Admit date: 03/05/2023 Discharge date: 03/09/2023 Disposition: Home, No home health services recommended PCP: Armando Gang, FNP  Recommendations for Outpatient Follow-up:  Follow up with PCP within 1-2 weeks Regarding general hospital follow up and preventative care Recommend addressing chronic pain as well as compiling symptoms from depression and anxiety Follow up with pulmonology  Discharge Diagnoses:  Active Problems:   Acute exacerbation of chronic obstructive pulmonary disease (COPD) (HCC)   Acute on chronic respiratory failure with hypoxia (HCC)   COPD with acute exacerbation [facility])   Notalgia  Brief Hospital Course Summary: Colleen [name] is a 62 y.o. female with a PMH significant for anxiety/depression , Asthma/COPD on PRN O2 of 2-3L at home, h/o L BRCA in remission, Chronic back pain, PTSD, Myofascial pain syndrome .   They presented from home to the ED on 03/05/2023 with SOB and back pain worsening x several days. She states that she has been wearing her home oxygen consistently for 2 weeks at 2-3L but normally doesn't wear it continuously. Her back pain is treated with tylenol at baseline.    In the ED, it was found that they had Afeb, bp 146/96, HR 103, rr 22 sat 94% ORA.  Significant findings included WBC 5.9, Hgb 12.8, platelets 253, troponin negative x 2, viral respiratory path negative, D-dimer negative, Na+ 130, K+ 4.1, CR 0.42. Chest x-ray positive for hyperinflation with chronic changes but no acute cardiopulmonary disease or consolidation.   They were initially treated with anxiolytics, home medications, breathing treatments.    Patient was admitted to medicine service for further workup and management of shortness of breath and back pain as outlined in detail below.   4/27-4/29: patient remained stable on her home level of oxygen and refused to  ambulate due to her chronic back pain. She appears to have also significant anxiety and depression that interfere with her ability to adhere with recommendations to ambulate. She remained inpatient for her chronic back pain which she stated was intolerable but unchanged from her baseline. Her pain was never fully addressed throughout admission despite several approaches including voltaren gel, heating pad, percocet. She was eventually agreeable to ambulating with pulse ox and seen to be tolerating her home oxygen supplement use of 3L. She was agreeable to discharge and follow up with her PCP. I did continue short term pain treatment as listed below as well as complete her COPD exacerbation course with prednisone and doxycycline to treat compounding symptoms.  She was otherwise discharged with her chronic home inhalers and anxiety treatments.  Discharge Condition: Stable, improved Recommended discharge diet: Regular healthy diet  Consultations: None   Procedures/Studies: None   Allergies as of 03/09/2023       Reactions   Desvenlafaxine Anaphylaxis   Morphine And Related Anaphylaxis   Penicillins Anaphylaxis   Has patient had a PCN reaction causing immediate rash, facial/tongue/throat swelling, SOB or lightheadedness with hypotension: Yes Has patient had a PCN reaction causing severe rash involving mucus membranes or skin necrosis: No Has patient had a PCN reaction that required hospitalization: Unknown Has patient had a PCN reaction occurring within the last 10 years: No If all of the above answers are "NO", then may proceed with Cephalosporin use.   Prazosin Other (See Comments)   Tamoxifen Anaphylaxis   Trazodone Anaphylaxis   Clonazepam    Codeine Swelling   Duloxetine Itching   Gabapentin Hives  Rash and swelling.    Lorazepam    Patient feels this medications makes her to sedated and wants to avoid   Paroxetine Hcl Hives   Sulfa Antibiotics Itching   Cephalexin Rash         Medication List     STOP taking these medications    FLUoxetine 40 MG capsule Commonly known as: PROZAC   ipratropium 0.03 % nasal spray Commonly known as: ATROVENT       TAKE these medications    albuterol 108 (90 Base) MCG/ACT inhaler Commonly known as: VENTOLIN HFA Inhale 2 puffs into the lungs every 6 (six) hours as needed for wheezing or shortness of breath.   aspirin EC 81 MG tablet Take 1 tablet (81 mg total) by mouth daily. Swallow whole.   benzonatate 200 MG capsule Commonly known as: TESSALON Take 1 capsule (200 mg total) by mouth 3 (three) times daily as needed for cough.   carvedilol 3.125 MG tablet Commonly known as: COREG Take 1 tablet (3.125 mg total) by mouth 2 (two) times daily with a meal.   cholecalciferol 25 MCG (1000 UNIT) tablet Commonly known as: VITAMIN D3 Take 1,000 Units by mouth daily.   cyanocobalamin 500 MCG tablet Commonly known as: VITAMIN B12 Take 500 mcg by mouth daily.   denosumab 60 MG/ML Sosy injection Commonly known as: PROLIA Inject 60 mg into the skin every 6 (six) months.   doxycycline 100 MG tablet Commonly known as: VIBRA-TABS Take 1 tablet (100 mg total) by mouth 2 (two) times daily for 4 days.   EPINEPHrine 0.3 mg/0.3 mL Soaj injection Commonly known as: EPI-PEN Inject 0.3 mg into the muscle as needed for anaphylaxis.   fluticasone 50 MCG/ACT nasal spray Commonly known as: FLONASE Place 1 spray into both nostrils 2 (two) times daily.   hydrOXYzine 50 MG tablet Commonly known as: ATARAX Take 1 tablet (50 mg total) by mouth 3 (three) times daily as needed.   ipratropium 0.02 % nebulizer solution Commonly known as: ATROVENT Take 0.5 mg by nebulization 4 (four) times daily.   methocarbamol 750 MG tablet Commonly known as: ROBAXIN Take 1 tablet (750 mg total) by mouth 4 (four) times daily.   montelukast 10 MG tablet Commonly known as: SINGULAIR Take 10 mg by mouth daily.   ondansetron 4 MG tablet Commonly  known as: ZOFRAN Take 1 tablet (4 mg total) by mouth every 6 (six) hours as needed for up to 10 days for nausea.   oxyCODONE-acetaminophen 5-325 MG tablet Commonly known as: PERCOCET/ROXICET Take 1 tablet by mouth every 4 (four) hours as needed for up to 5 days for moderate pain.   pantoprazole 40 MG tablet Commonly known as: PROTONIX Take 1 tablet (40 mg total) by mouth 2 (two) times daily before a meal.   predniSONE 20 MG tablet Commonly known as: DELTASONE Take 2 tablets (40 mg total) by mouth daily with breakfast for 4 days. Start taking on: March 10, 2023   Trelegy Ellipta 100-62.5-25 MCG/ACT Aepb Generic drug: Fluticasone-Umeclidin-Vilant Inhale 1 puff into the lungs daily.               Durable Medical Equipment  (From admission, onward)           Start     Ordered   03/09/23 1222  For home use only DME oxygen  Once       Question Answer Comment  Length of Need Lifetime   Mode or (Route) Nasal cannula   Liters  per Minute 3   Frequency Continuous (stationary and portable oxygen unit needed)   Oxygen conserving device Yes   Oxygen delivery system Gas      03/09/23 1221           Subjective   Pt reports feeling bad. Continues to complain of her chronic back pain which is still unchanged no matter what treatment she receives and same as her baseline. She thinks the heating pad did help a little. She denies SOB or CP. Did not get out of bed except to go to the bathroom since she's been at the hospital. She agrees to walk with the nurse to assess her oxygen needs.   All questions and concerns were addressed at time of discharge.  Objective  Blood pressure (!) 139/91, pulse 87, temperature 97.8 F (36.6 C), temperature source Oral, resp. rate 20, height 5' (1.524 m), weight 51.2 kg, SpO2 96 %.   General: Pt is alert, awake, in mild distress Cardiovascular: RRR, S1/S2 +, no rubs, no gallops Respiratory: CTA bilaterally, no wheezing, no rhonchi Abdominal:  Soft, NT, ND, bowel sounds + Extremities: no edema, no cyanosis Psych: frustrated. Decision making capacity appears intact.  The results of significant diagnostics from this hospitalization (including imaging, microbiology, ancillary and laboratory) are listed below for reference.   Imaging studies: DG Chest 2 View  Result Date: 03/05/2023 CLINICAL DATA:  Cough and shortness of breath.  COPD EXAM: CHEST - 2 VIEW COMPARISON:  X-ray 01/02/2023 FINDINGS: No consolidation, pneumothorax or effusion. Normal cardiopericardial silhouette without edema. Chronic interstitial changes. Degenerative changes of the spine. Hyperinflation. IMPRESSION: Hyperinflation with chronic changes. No acute cardiopulmonary disease Electronically Signed   By: Karen Kays M.D.   On: 03/05/2023 16:35    Labs: Basic Metabolic Panel: Recent Labs  Lab 03/05/23 1612 03/06/23 0118 03/08/23 0524 03/09/23 0452  NA 132* 130* 125* 127*  K 4.6 4.1 3.9 4.4  CL 92* 91* 87* 87*  CO2 32 31 30 34*  GLUCOSE 88 115* 117* 85  BUN 8 7* 17 24*  CREATININE 0.44 0.42* 0.46 0.45  CALCIUM 9.4 8.5* 9.0 9.2   CBC: Recent Labs  Lab 03/05/23 1612 03/06/23 0118 03/08/23 0524  WBC 5.9 6.0 6.3  HGB 12.8 11.9* 11.4*  HCT 39.5 37.2 34.1*  MCV 90.6 92.1 87.9  PLT 253 214 216   Microbiology: Results for orders placed or performed during the hospital encounter of 03/05/23  Expectorated Sputum Assessment w Gram Stain, Rflx to Resp Cult     Status: None   Collection Time: 03/06/23  1:17 AM   Specimen: Sputum  Result Value Ref Range Status   Specimen Description SPUTUM EXPSU  Final   Special Requests NONE  Final   Sputum evaluation   Final    Sputum specimen not acceptable for testing.  Please recollect.   RESULT CALLED TO, READ BACK BY AND VERIFIED WITH: CATHERINE CHERNESKY @0357  ON 03/06/23 SKL Performed at [facility], [address] [place], [place] [address]    Report Status 03/06/2023 FINAL  Final  Respiratory  (~20 pathogens) panel by PCR     Status: None   Collection Time: 03/06/23  1:18 AM   Specimen: Nasopharyngeal Swab; Respiratory  Result Value Ref Range Status   Adenovirus NOT DETECTED NOT DETECTED Final   Coronavirus 229E NOT DETECTED NOT DETECTED Final    Comment: (NOTE) The Coronavirus on the Respiratory Panel, DOES NOT test for the novel  Coronavirus (2019 nCoV)    Coronavirus  HKU1 NOT DETECTED NOT DETECTED Final   Coronavirus NL63 NOT DETECTED NOT DETECTED Final   Coronavirus OC43 NOT DETECTED NOT DETECTED Final   Metapneumovirus NOT DETECTED NOT DETECTED Final   Rhinovirus / Enterovirus NOT DETECTED NOT DETECTED Final   Influenza A NOT DETECTED NOT DETECTED Final   Influenza B NOT DETECTED NOT DETECTED Final   Parainfluenza Virus 1 NOT DETECTED NOT DETECTED Final   Parainfluenza Virus 2 NOT DETECTED NOT DETECTED Final   Parainfluenza Virus 3 NOT DETECTED NOT DETECTED Final   Parainfluenza Virus 4 NOT DETECTED NOT DETECTED Final   Respiratory Syncytial Virus NOT DETECTED NOT DETECTED Final   Bordetella pertussis NOT DETECTED NOT DETECTED Final   Bordetella Parapertussis NOT DETECTED NOT DETECTED Final   Chlamydophila pneumoniae NOT DETECTED NOT DETECTED Final   Mycoplasma pneumoniae NOT DETECTED NOT DETECTED Final    Comment: Performed at [facility], [address] [place], [place] [address]   Time coordinating discharge: Over 30 minutes  Leeroy Bock, MD  [facility] 03/09/2023, 3:15 PM

## 2023-03-09 NOTE — TOC Initial Note (Signed)
Transition of Care [facility]) - Initial/Assessment Note    Patient Details  Name: Colleen [name] MRN: [id number] Date of Birth: June 18, 1961  [facility]) CM/SW Contact:    Chapman Fitch, RN Phone Number: 03/09/2023, 3:13 PM  Clinical Narrative:                    Patient to discharge today Patient states that her husband will transport at discharge Patient states that she has O2 through [employer].  Patient confirms she has portable O2 for the ride home today, and concentrator at home Discussed option of home health for COPD. Patient declines      Patient Goals and CMS Choice            Expected Discharge Plan and Services         Expected Discharge Date: 03/09/23                                    Prior Living Arrangements/Services                       Activities of Daily Living Home Assistive Devices/Equipment: Cane (specify quad or straight), Dentures (specify type), Eyeglasses, Walker (specify type), Oxygen ADL Screening (condition at time of admission) Patient's cognitive ability adequate to safely complete daily activities?: Yes Is the patient deaf or have difficulty hearing?: No Does the patient have difficulty seeing, even when wearing glasses/contacts?: No Does the patient have difficulty concentrating, remembering, or making decisions?: No Patient able to express need for assistance with ADLs?: Yes Does the patient have difficulty dressing or bathing?: No Independently performs ADLs?: Yes (appropriate for developmental age) Does the patient have difficulty walking or climbing stairs?: No Weakness of Legs: None Weakness of Arms/Hands: None  Permission Sought/Granted                  Emotional Assessment              Admission diagnosis:  Acute exacerbation of chronic obstructive pulmonary disease (COPD) (HCC) [J44.1] COPD with acute exacerbation (HCC) [J44.1] Acute on chronic respiratory failure with hypoxia (HCC)  [J96.21] Patient Active Problem List   Diagnosis Date Noted   Notalgia 03/08/2023   Acute on chronic respiratory failure with hypoxia (HCC) 03/07/2023   COPD with acute exacerbation (HCC) 03/07/2023   Acute exacerbation of chronic obstructive pulmonary disease (COPD) (HCC) 03/06/2023   Thoracic facet joint syndrome 09/11/2022   Thoracic degenerative disc disease 09/11/2022   Chest pain 07/16/2022   Protein-calorie malnutrition, moderate (HCC) 06/21/2022   Chronic diarrhea of unknown origin    Loss of weight    Gastric erosion    Dysphagia 06/20/2022   Right buttock pain 06/19/2022   Right-sided chest pain 06/18/2022   Coronary artery disease 03/10/2022   Dependence on nocturnal oxygen therapy 03/10/2022   At risk for prolonged QT interval syndrome 11/19/2021   Tobacco use disorder 06/19/2021   PTSD (post-traumatic stress disorder) 03/04/2021   Myofascial pain syndrome 07/09/2020   Chronic pain syndrome 06/04/2020   Osteopenia of multiple sites 06/04/2020   Age related osteoporosis 06/04/2020   Low back pain 04/27/2020   Panic attack 03/20/2020   COPD (chronic obstructive pulmonary disease) (HCC) 10/11/2018   Generalized anxiety disorder 10/11/2018   PCP:  Armando Gang, FNP Pharmacy:   [phone] [address], [place] - [address] [place] [address] Phone: [phone] Fax: [phone]     Social Determinants of Health (SDOH) Social History: SDOH Screenings   Alcohol Screen: Low Risk  (12/13/2021)  Depression (PHQ2-9): Low Risk  (09/11/2022)  Tobacco Use: High Risk (03/06/2023)   SDOH Interventions:     Readmission Risk Interventions    07/17/2022   10:41 AM 03/10/2022    9:51 AM 02/14/2022    1:57 PM  Readmission Risk Prevention Plan  Transportation Screening Complete Complete Complete  Medication Review (RN Care Manager) Complete Complete Complete  PCP or Specialist appointment within 3-5 days of discharge  Complete Complete  HRI  or Home Care Consult Patient refused    SW Recovery Care/Counseling Consult Complete Complete   Palliative Care Screening Not Applicable Not Applicable Not Applicable  Skilled Nursing Facility Not Applicable Not Applicable Not Applicable

## 2023-06-25 ENCOUNTER — Encounter: Payer: Self-pay | Admitting: Emergency Medicine

## 2023-06-25 ENCOUNTER — Emergency Department

## 2023-06-25 ENCOUNTER — Other Ambulatory Visit: Payer: Self-pay

## 2023-06-25 ENCOUNTER — Emergency Department
Admission: EM | Admit: 2023-06-25 | Discharge: 2023-06-25 | Disposition: A | Discharge status: Home / Self Care | Attending: Emergency Medicine | Admitting: Emergency Medicine

## 2023-06-25 DIAGNOSIS — R079 Chest pain, unspecified: Secondary | ICD-10-CM | POA: Diagnosis present

## 2023-06-25 DIAGNOSIS — J449 Chronic obstructive pulmonary disease, unspecified: Secondary | ICD-10-CM | POA: Diagnosis not present

## 2023-06-25 DIAGNOSIS — R0602 Shortness of breath: Secondary | ICD-10-CM | POA: Insufficient documentation

## 2023-06-25 DIAGNOSIS — R0789 Other chest pain: Secondary | ICD-10-CM | POA: Diagnosis not present

## 2023-06-25 DIAGNOSIS — Z1152 Encounter for screening for COVID-19: Secondary | ICD-10-CM | POA: Diagnosis not present

## 2023-06-25 DIAGNOSIS — R251 Tremor, unspecified: Secondary | ICD-10-CM | POA: Insufficient documentation

## 2023-06-25 LAB — RESP PANEL BY RT-PCR (RSV, FLU A&B, COVID)  RVPGX2
Influenza A by PCR: NEGATIVE
Influenza B by PCR: NEGATIVE
Resp Syncytial Virus by PCR: NEGATIVE
SARS Coronavirus 2 by RT PCR: NEGATIVE

## 2023-06-25 LAB — BASIC METABOLIC PANEL
Anion gap: 12 (ref 5–15)
BUN: 6 mg/dL — ABNORMAL LOW (ref 8–23)
CO2: 28 mmol/L (ref 22–32)
Calcium: 9.4 mg/dL (ref 8.9–10.3)
Chloride: 90 mmol/L — ABNORMAL LOW (ref 98–111)
Creatinine, Ser: 0.41 mg/dL — ABNORMAL LOW (ref 0.44–1.00)
GFR, Estimated: >60 mL/min (ref >60)
Glucose, Bld: 112 mg/dL — ABNORMAL HIGH (ref 70–99)
Potassium: 4 mmol/L (ref 3.5–5.1)
Sodium: 130 mmol/L — ABNORMAL LOW (ref 135–145)

## 2023-06-25 LAB — CBC
HCT: 36.3 % (ref 36.0–46.0)
Hemoglobin: 12.1 g/dL (ref 12.0–15.0)
MCH: 30 pg (ref 26.0–34.0)
MCHC: 33.3 g/dL (ref 30.0–36.0)
MCV: 89.9 fL (ref 80.0–100.0)
Platelets: 321 10*3/uL (ref 150–400)
RBC: 4.04 MIL/uL (ref 3.87–5.11)
RDW: 12.3 % (ref 11.5–15.5)
WBC: 7.4 10*3/uL (ref 4.0–10.5)
nRBC: 0 % (ref 0.0–0.2)

## 2023-06-25 LAB — TROPONIN I (HIGH SENSITIVITY)
Troponin I (High Sensitivity): 5 ng/L (ref <18)
Troponin I (High Sensitivity): 5 ng/L (ref <18)

## 2023-06-25 LAB — D-DIMER, QUANTITATIVE: D-Dimer, Quant: 0.55 ug{FEU}/mL — ABNORMAL HIGH (ref 0.00–0.50)

## 2023-06-25 MED ORDER — OXYCODONE-ACETAMINOPHEN 5-325 MG PO TABS
1.0000 | ORAL_TABLET | Freq: Once | ORAL | Status: AC
  Administered 2023-06-25: 1 via ORAL
  Filled 2023-06-25: qty 1

## 2023-06-25 MED ORDER — IPRATROPIUM-ALBUTEROL 0.5-2.5 (3) MG/3ML IN SOLN
3.0000 mL | Freq: Once | RESPIRATORY_TRACT | Status: AC
  Administered 2023-06-25: 3 mL via RESPIRATORY_TRACT
  Filled 2023-06-25: qty 3

## 2023-06-25 MED ORDER — HYDROXYZINE HCL 50 MG PO TABS
50.0000 mg | ORAL_TABLET | Freq: Once | ORAL | Status: AC
  Administered 2023-06-25: 50 mg via ORAL
  Filled 2023-06-25: qty 1

## 2023-06-25 NOTE — ED Notes (Signed)
Ambulated Pt in Flex Hallway without O2. Pt started in the Bed on 2L Nasal at 93%. Pt was steady ambulating, but Pt's husband and I walked with Pt. About half way through the walk Pt was getting obviously short of breath. Pt's O2 Sat steadily started dropping. About 3/4 way through the walk her O2 was at 74%. Once back in the RM pt's Pulse Ox was at 72% with a good wave form and Pt was very short of breath. Pt hooked back to 2L Nasal O2. Stayed with Pt while she was getting her breathing back under control. After a few minutes Pt was still at 88% so I turned her O2 up to 3L. Pt's ER Dr aware of all.

## 2023-06-25 NOTE — ED Notes (Addendum)
Visitor at the bedside. 

## 2023-06-25 NOTE — ED Triage Notes (Signed)
Pt c/o feeling as if someone was sitting on her chest intermittently x3 days. Pt reports pain is constant. Pt is on oxygen at home for COPD. Pt reports pain intensifies when she moves. Pt c/o nausea and decreased appetite.

## 2023-06-25 NOTE — TOC CM/SW Note (Signed)
Cm received call from [place] with [employer] [phone] in regards to put oxyen needs for home. He stated pt needs new O2 testing and new O2 order for home. Cm notified doctor.

## 2023-06-25 NOTE — ED Provider Notes (Signed)
[facility] Provider Note    Event Date/Time   First MD Initiated Contact with Patient 06/25/23 1534     (approximate)   History   Chief Complaint Shortness of Breath and Chest Pain   HPI  Colleen [name] is a 62 y.o. female with past medical history of COPD, chronic hypoxic respiratory failure on 2 L nasal cannula, chronic pain syndrome, and anxiety who presents to the ED complaining of chest pain.  Patient describes pressure across both sides of her chest constantly over the past 3 days along with a feeling of tightness in her chest and some mild difficulty breathing.  She has not had any fevers or cough, denies any pain or swelling in her legs.  She has also had nausea with some decreased appetite, states she will occasionally feel like there is something stuck in the back of her throat.     Physical Exam   Triage Vital Signs: ED Triage Vitals  Encounter Vitals Group     BP 06/25/23 1357 (!) 155/116     Systolic BP Percentile --      Diastolic BP Percentile --      Pulse Rate 06/25/23 1357 99     Resp 06/25/23 1357 (!) 26     Temp 06/25/23 1357 98.4 F (36.9 C)     Temp Source 06/25/23 1357 Oral     SpO2 06/25/23 1357 93 %     Weight 06/25/23 1353 98 lb (44.5 kg)     Height 06/25/23 1353 5' (1.524 m)     Head Circumference --      Peak Flow --      Pain Score 06/25/23 1353 10     Pain Loc --      Pain Education --      Exclude from Growth Chart --     Most recent vital signs: Vitals:   06/25/23 1630 06/25/23 1634  BP: (!) 137/92   Pulse: 90   Resp: 20   Temp: 98.3 F (36.8 C)   SpO2: 90% 94%    Constitutional: Alert and oriented. Eyes: Conjunctivae are normal. Head: Atraumatic. Nose: No congestion/rhinnorhea. Mouth/Throat: Mucous membranes are moist.  Cardiovascular: Normal rate, regular rhythm. Grossly normal heart sounds.  2+ radial pulses bilaterally. Respiratory: Normal respiratory effort.  No retractions. Lungs  CTAB. Gastrointestinal: Soft and nontender. No distention. Musculoskeletal: No lower extremity tenderness nor edema.  Neurologic:  Normal speech and language. No gross focal neurologic deficits are appreciated.    ED Results / Procedures / Treatments   Labs (all labs ordered are listed, but only abnormal results are displayed) Labs Reviewed  BASIC METABOLIC PANEL - Abnormal; Notable for the following components:      Result Value   Sodium 130 (*)    Chloride 90 (*)    Glucose, Bld 112 (*)    BUN 6 (*)    Creatinine, Ser 0.41 (*)    All other components within normal limits  D-DIMER, QUANTITATIVE - Abnormal; Notable for the following components:   D-Dimer, Quant 0.55 (*)    All other components within normal limits  RESP PANEL BY RT-PCR (RSV, FLU A&B, COVID)  [id number]  CBC  TROPONIN I (HIGH SENSITIVITY)  TROPONIN I (HIGH SENSITIVITY)     EKG  ED ECG REPORT I, Chesley Noon, the attending physician, personally viewed and interpreted this ECG.   Date: 06/25/2023  EKG Time: 14:04  Rate: 104  Rhythm: sinus tachycardia  Axis: Normal  Intervals:none  ST&T Change: None  RADIOLOGY Chest x-ray reviewed and interpreted by me with no infiltrate, edema, or effusion.  PROCEDURES:  Critical Care performed: No  Procedures   MEDICATIONS ORDERED IN ED: Medications  oxyCODONE-acetaminophen (PERCOCET/ROXICET) 5-325 MG per tablet 1 tablet (1 tablet Oral Given 06/25/23 1639)  ipratropium-albuterol (DUONEB) 0.5-2.5 (3) MG/3ML nebulizer solution 3 mL (3 mLs Nebulization Given 06/25/23 1640)  hydrOXYzine (ATARAX) tablet 50 mg (50 mg Oral Given 06/25/23 1644)     IMPRESSION / MDM / ASSESSMENT AND PLAN / ED COURSE  I reviewed the triage vital signs and the nursing notes.                              62 y.o. female with past medical history of COPD, chronic hypoxic respiratory failure on 2 L nasal cannula, chronic pain syndrome, and anxiety who presents to the ED complaining of 3  days of constant pressure across both sides of her chest with a sensation of tightness and some mild difficulty breathing.  Patient's presentation is most consistent with acute presentation with potential threat to life or bodily function.  Differential diagnosis includes, but is not limited to, ACS, PE, pneumonia, pneumothorax, musculoskeletal pain, GERD, anxiety, COPD exacerbation.  Patient nontoxic-appearing and in no acute distress, vital signs are unremarkable.  EKG shows no evidence of arrhythmia or ischemia, 2 sets of troponin within normal limits and I have low suspicion for ACS.  D-dimer is within age-adjusted normal limits and I doubt PE.  Additional labs are reassuring with no significant anemia, leukocytosis, tract abnormality, or AKI.  There may be a component of GERD causing her symptoms as she describes globus sensation, has follow-up scheduled with ENT for this issue.  There also may be a component of anxiety, and has been patient reports this is a longstanding issue for her.  She had improvement in pain following dose of pain medication along with her usual Atarax for anxiety.  Testing for COVID-19 and influenza is negative.  In further discussion with the patient, chest pain appears to be a chronic issue for her, recently worked up at [facility] with unremarkable testing at that time.  She is appropriate for discharge home with outpatient follow-up, was counseled to return to the ED for new or worsening symptoms.  We did perform walk testing here in the ED and new order placed for oxygen at the request of social work.  Patient and husband agree with plan.      FINAL CLINICAL IMPRESSION(S) / ED DIAGNOSES   Final diagnoses:  Atypical chest pain  Chest tightness  Episode of shaking     Rx / DC Orders   ED Discharge Orders          Ordered    For home use only DME oxygen        06/25/23 1803             Note:  This document was prepared using Dragon voice  recognition software and may include unintentional dictation errors.   Chesley Noon, MD 06/25/23 [phone]

## 2023-08-31 ENCOUNTER — Other Ambulatory Visit: Payer: Self-pay | Admitting: Family Medicine

## 2023-08-31 DIAGNOSIS — Z1231 Encounter for screening mammogram for malignant neoplasm of breast: Secondary | ICD-10-CM

## 2023-11-09 ENCOUNTER — Ambulatory Visit
Admission: RE | Admit: 2023-11-09 | Discharge: 2023-11-09 | Disposition: A | Discharge status: Home / Self Care | Source: Ambulatory Visit | Attending: Family Medicine | Admitting: Family Medicine

## 2023-11-09 DIAGNOSIS — Z1231 Encounter for screening mammogram for malignant neoplasm of breast: Secondary | ICD-10-CM | POA: Diagnosis present

## 2023-12-07 ENCOUNTER — Emergency Department
Admission: EM | Admit: 2023-12-07 | Discharge: 2023-12-08 | Disposition: A | Discharge status: Home / Self Care | Attending: Emergency Medicine | Admitting: Emergency Medicine

## 2023-12-07 ENCOUNTER — Other Ambulatory Visit: Payer: Self-pay

## 2023-12-07 DIAGNOSIS — Z853 Personal history of malignant neoplasm of breast: Secondary | ICD-10-CM | POA: Insufficient documentation

## 2023-12-07 DIAGNOSIS — J45909 Unspecified asthma, uncomplicated: Secondary | ICD-10-CM | POA: Insufficient documentation

## 2023-12-07 DIAGNOSIS — Z7951 Long term (current) use of inhaled steroids: Secondary | ICD-10-CM | POA: Diagnosis not present

## 2023-12-07 DIAGNOSIS — Z7982 Long term (current) use of aspirin: Secondary | ICD-10-CM | POA: Insufficient documentation

## 2023-12-07 DIAGNOSIS — K5900 Constipation, unspecified: Secondary | ICD-10-CM | POA: Diagnosis not present

## 2023-12-07 DIAGNOSIS — I2510 Atherosclerotic heart disease of native coronary artery without angina pectoris: Secondary | ICD-10-CM | POA: Diagnosis not present

## 2023-12-07 DIAGNOSIS — R197 Diarrhea, unspecified: Secondary | ICD-10-CM | POA: Diagnosis not present

## 2023-12-07 DIAGNOSIS — R339 Retention of urine, unspecified: Secondary | ICD-10-CM | POA: Diagnosis not present

## 2023-12-07 DIAGNOSIS — R109 Unspecified abdominal pain: Secondary | ICD-10-CM

## 2023-12-07 DIAGNOSIS — J449 Chronic obstructive pulmonary disease, unspecified: Secondary | ICD-10-CM | POA: Diagnosis not present

## 2023-12-07 DIAGNOSIS — Z79899 Other long term (current) drug therapy: Secondary | ICD-10-CM | POA: Insufficient documentation

## 2023-12-07 DIAGNOSIS — R1031 Right lower quadrant pain: Secondary | ICD-10-CM | POA: Diagnosis present

## 2023-12-07 LAB — COMPREHENSIVE METABOLIC PANEL
ALT: 13 U/L (ref 0–44)
AST: 16 U/L (ref 15–41)
Albumin: 4.1 g/dL (ref 3.5–5.0)
Alkaline Phosphatase: 47 U/L (ref 38–126)
Anion gap: 11 (ref 5–15)
BUN: 10 mg/dL (ref 8–23)
CO2: 28 mmol/L (ref 22–32)
Calcium: 8.7 mg/dL — ABNORMAL LOW (ref 8.9–10.3)
Chloride: 93 mmol/L — ABNORMAL LOW (ref 98–111)
Creatinine, Ser: 0.58 mg/dL (ref 0.44–1.00)
GFR, Estimated: >60 mL/min (ref >60)
Glucose, Bld: 105 mg/dL — ABNORMAL HIGH (ref 70–99)
Potassium: 4.4 mmol/L (ref 3.5–5.1)
Sodium: 132 mmol/L — ABNORMAL LOW (ref 135–145)
Total Bilirubin: 0.5 mg/dL (ref 0.0–1.2)
Total Protein: 6.7 g/dL (ref 6.5–8.1)

## 2023-12-07 LAB — CBC
HCT: 38.4 % (ref 36.0–46.0)
Hemoglobin: 12.5 g/dL (ref 12.0–15.0)
MCH: 27.5 pg (ref 26.0–34.0)
MCHC: 32.6 g/dL (ref 30.0–36.0)
MCV: 84.4 fL (ref 80.0–100.0)
Platelets: 274 10*3/uL (ref 150–400)
RBC: 4.55 MIL/uL (ref 3.87–5.11)
RDW: 13.7 % (ref 11.5–15.5)
WBC: 8.6 10*3/uL (ref 4.0–10.5)
nRBC: 0 % (ref 0.0–0.2)

## 2023-12-07 LAB — LIPASE, BLOOD: Lipase: 28 U/L (ref 11–51)

## 2023-12-07 NOTE — ED Provider Notes (Signed)
[facility] Provider Note    Event Date/Time   First MD Initiated Contact with Patient 12/07/23 2346     (approximate)   History   Abdominal Pain   HPI  Colleen [name] is a 63 y.o. female who presents to the ED from home with a chief complaint of right flank/lower abdominal pain x 1 day.  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  States it has been 8 hours since she urinated.  Has the urge but nothing comes out.  Denies fever/chills, chest pain, shortness of breath.  Endorses diarrhea a few days ago.  Wears chronic oxygen for COPD.     Past Medical History   Past Medical History:  Diagnosis Date   Anxiety    Asthma    Breast cancer (HCC) 2004   left breast   Chronic back pain    COPD (chronic obstructive pulmonary disease) (HCC)    Depression    History of kidney surgery    Personal history of radiation therapy    PTSD (post-traumatic stress disorder)      Active Problem List   Patient Active Problem List   Diagnosis Date Noted   Notalgia 03/08/2023   Acute on chronic respiratory failure with hypoxia (HCC) 03/07/2023   COPD with acute exacerbation (HCC) 03/07/2023   Acute exacerbation of chronic obstructive pulmonary disease (COPD) (HCC) 03/06/2023   Thoracic facet joint syndrome 09/11/2022   Thoracic degenerative disc disease 09/11/2022   Chest pain 07/16/2022   Protein-calorie malnutrition, moderate (HCC) 06/21/2022   Chronic diarrhea of unknown origin    Loss of weight    Gastric erosion    Dysphagia 06/20/2022   Right buttock pain 06/19/2022   Right-sided chest pain 06/18/2022   Coronary artery disease 03/10/2022   Dependence on nocturnal oxygen therapy 03/10/2022   At risk for prolonged QT interval syndrome 11/19/2021   Tobacco use disorder 06/19/2021   PTSD (post-traumatic stress disorder) 03/04/2021   Myofascial pain syndrome 07/09/2020   Chronic pain syndrome 06/04/2020   Osteopenia of multiple sites  06/04/2020   Age related osteoporosis 06/04/2020   Low back pain 04/27/2020   Panic attack 03/20/2020   COPD (chronic obstructive pulmonary disease) (HCC) 10/11/2018   Generalized anxiety disorder 10/11/2018     Past Surgical History   Past Surgical History:  Procedure Laterality Date   BREAST BIOPSY Left 2004   positive   BREAST BIOPSY Right    neg   BREAST LUMPECTOMY Left 2004   positive   COLONOSCOPY WITH PROPOFOL N/A 06/21/2022   Procedure: COLONOSCOPY WITH PROPOFOL;  Surgeon: Toney Reil, MD;  Location: [facility] ENDOSCOPY;  Service: Gastroenterology;  Laterality: N/A;   ESOPHAGOGASTRODUODENOSCOPY (EGD) WITH PROPOFOL N/A 06/21/2022   Procedure: ESOPHAGOGASTRODUODENOSCOPY (EGD) WITH PROPOFOL;  Surgeon: Toney Reil, MD;  Location: [facility] ENDOSCOPY;  Service: Gastroenterology;  Laterality: N/A;   LEFT HEART CATH AND CORONARY ANGIOGRAPHY N/A 11/06/2021   Procedure: LEFT HEART CATH AND CORONARY ANGIOGRAPHY;  Surgeon: Lamar Blinks, MD;  Location: [facility] INVASIVE CV LAB;  Service: Cardiovascular;  Laterality: N/A;   MASTECTOMY Left    MASTECTOMY     NEPHRECTOMY Left    TUBAL LIGATION       Home Medications   Prior to Admission medications   Medication Sig Start Date End Date Taking? Authorizing Provider  lactulose (CHRONULAC) 10 GM/15ML solution Take 30 mLs (20 g total) by mouth daily as needed for mild constipation. 12/08/23  Yes Irean Hong, MD  albuterol (VENTOLIN HFA) 108 (90 Base) MCG/ACT inhaler Inhale 2 puffs into the lungs every 6 (six) hours as needed for wheezing or shortness of breath. 11/21/22   Viviano Simas, FNP  aspirin EC 81 MG tablet Take 1 tablet (81 mg total) by mouth daily. Swallow whole. 07/18/22   Enedina Finner, MD  benzonatate (TESSALON) 200 MG capsule Take 1 capsule (200 mg total) by mouth 3 (three) times daily as needed for cough. 03/09/23   Leeroy Bock, MD  carvedilol (COREG) 3.125 MG tablet Take 1 tablet (3.125 mg total) by mouth 2 (two)  times daily with a meal. 07/17/22   Enedina Finner, MD  cholecalciferol (VITAMIN D3) 25 MCG (1000 UNIT) tablet Take 1,000 Units by mouth daily.    [provider]  denosumab (PROLIA) 60 MG/ML SOSY injection Inject 60 mg into the skin every 6 (six) months.    [provider]  EPINEPHrine 0.3 mg/0.3 mL IJ SOAJ injection Inject 0.3 mg into the muscle as needed for anaphylaxis.    [provider]  fluticasone (FLONASE) 50 MCG/ACT nasal spray Place 1 spray into both nostrils 2 (two) times daily. 05/23/22   [provider]  Fluticasone-Umeclidin-Vilant (TRELEGY ELLIPTA) 100-62.5-25 MCG/INH AEPB Inhale 1 puff into the lungs daily.    [provider]  hydrOXYzine (ATARAX) 50 MG tablet Take 1 tablet (50 mg total) by mouth 3 (three) times daily as needed. 07/17/22   Enedina Finner, MD  ipratropium (ATROVENT) 0.02 % nebulizer solution Take 0.5 mg by nebulization 4 (four) times daily.    [provider]  methocarbamol (ROBAXIN) 750 MG tablet Take 1 tablet (750 mg total) by mouth 4 (four) times daily. 06/21/22   Danford, Earl Lites, MD  montelukast (SINGULAIR) 10 MG tablet Take 10 mg by mouth daily. 06/07/22   [provider]  pantoprazole (PROTONIX) 40 MG tablet Take 1 tablet (40 mg total) by mouth 2 (two) times daily before a meal. 06/21/22   Danford, Earl Lites, MD  vitamin B-12 (CYANOCOBALAMIN) 500 MCG tablet Take 500 mcg by mouth daily.    [provider]     Allergies  Desvenlafaxine, Morphine and codeine, Penicillins, Prazosin, Tamoxifen, Trazodone, Clonazepam, Codeine, Duloxetine, Gabapentin, Lorazepam, Paroxetine hcl, Sulfa antibiotics, and Cephalexin   Family History   Family History  Problem Relation Age of Onset   Breast cancer Cousin    Breast cancer Cousin    Bipolar disorder Daughter    Drug abuse Daughter    Alcohol abuse Maternal Grandfather      Physical Exam  Triage Vital Signs: ED Triage Vitals  Encounter Vitals  Group     BP 12/07/23 2125 (!) 162/101     Systolic BP Percentile --      Diastolic BP Percentile --      Pulse Rate 12/07/23 2125 94     Resp 12/07/23 2125 18     Temp 12/07/23 2125 98.2 F (36.8 C)     Temp Source 12/07/23 2125 Oral     SpO2 12/07/23 2125 95 %     Weight 12/07/23 2124 100 lb (45.4 kg)     Height 12/07/23 2124 5\' 2"  (1.575 m)     Head Circumference --      Peak Flow --      Pain Score 12/07/23 2124 10     Pain Loc --      Pain Education --      Exclude from Growth Chart --     Updated Vital  Signs: BP (!) 143/91   Pulse 93   Temp 98.2 F (36.8 C) (Oral)   Resp (!) 22   Ht 5\' 2"  (1.575 m)   Wt 45.4 kg   SpO2 98%   BMI 18.29 kg/m    General: Awake, mild distress.  CV:  RRR.  Good peripheral perfusion.  Resp:  Normal effort.  CTAB. Abd:  Mild suprapubic tenderness to palpation without rebound or guarding.  Mild right CVAT.  No distention.  Other:  No truncal vesicles.   ED Results / Procedures / Treatments  Labs (all labs ordered are listed, but only abnormal results are displayed) Labs Reviewed  COMPREHENSIVE METABOLIC PANEL - Abnormal; Notable for the following components:      Result Value   Sodium 132 (*)    Chloride 93 (*)    Glucose, Bld 105 (*)    Calcium 8.7 (*)    All other components within normal limits  URINALYSIS, ROUTINE W REFLEX MICROSCOPIC - Abnormal; Notable for the following components:   Color, Urine YELLOW (*)    APPearance CLEAR (*)    Protein, ur 30 (*)    Bacteria, UA RARE (*)    All other components within normal limits  LIPASE, BLOOD  CBC     EKG  None   RADIOLOGY I have independently visualized and interpreted patient's imaging study as well as noted the radiology interpretation:  CT renal stone study: Status post left nephrectomy, no acute abnormality noted.  Moderate stool burden.  Official radiology report(s): CT Renal Stone Study Result Date: 12/08/2023 CLINICAL DATA:  Lower abdominal pain with  decreased urine output, initial encounter EXAM: CT ABDOMEN AND PELVIS WITHOUT CONTRAST TECHNIQUE: Multidetector CT imaging of the abdomen and pelvis was performed following the standard protocol without IV contrast. RADIATION DOSE REDUCTION: This exam was performed according to the departmental dose-optimization program which includes automated exposure control, adjustment of the mA and/or kV according to patient size and/or use of iterative reconstruction technique. COMPARISON:  05/01/2022 FINDINGS: Lower chest: Diffuse emphysematous changes are noted. Patchy scarring is noted in the right base. Hepatobiliary: No focal liver abnormality is seen. No gallstones, gallbladder wall thickening, or biliary dilatation. Pancreas: Unremarkable. No pancreatic ductal dilatation or surrounding inflammatory changes. Spleen: Normal in size without focal abnormality. Adrenals/Urinary Tract: Surgically removed left kidney stable from the prior exam. Right kidney is enlarged. No renal calculi or obstructive changes are seen. The bladder is partially distended. Stomach/Bowel: No obstructive or inflammatory changes of the colon are noted. The appendix is well visualized and within normal limits. Small bowel and stomach are unremarkable. Vascular/Lymphatic: Aortic atherosclerosis. No enlarged abdominal or pelvic lymph nodes. Reproductive: Uterus and bilateral adnexa are unremarkable. Other: No abdominal wall hernia or abnormality. No abdominopelvic ascites. Musculoskeletal: No acute or significant osseous findings. IMPRESSION: Status post left nephrectomy. No acute abnormality noted. Aortic Atherosclerosis (ICD10-I70.0) and Emphysema (ICD10-J43.9). Electronically Signed   By: Alcide Clever M.D.   On: 12/08/2023 00:47     PROCEDURES:  Critical Care performed: No  .1-3 Lead EKG Interpretation  Performed by: Irean Hong, MD Authorized by: Irean Hong, MD     Interpretation: normal     ECG rate:  95   ECG rate assessment:  normal     Rhythm: sinus rhythm     Ectopy: none     Conduction: normal   Comments:     Patient placed on cardiac to evaluate for arrhythmias    MEDICATIONS ORDERED IN ED: Medications  HYDROmorphone (DILAUDID) injection 0.5 mg (0.5 mg Intravenous Given 12/08/23 0038)  ondansetron (ZOFRAN) injection 4 mg (4 mg Intravenous Given 12/08/23 0038)  sodium chloride 0.9 % bolus 500 mL (0 mLs Intravenous Stopped 12/08/23 0159)  hydrOXYzine (ATARAX) tablet 25 mg (25 mg Oral Given 12/08/23 0056)     IMPRESSION / MDM / ASSESSMENT AND PLAN / ED COURSE  I reviewed the triage vital signs and the nursing notes.                             63 year old female presenting with right flank/abdominal pain. Differential diagnosis includes, but is not limited to, ovarian cyst, ovarian torsion, acute appendicitis, diverticulitis, urinary tract infection/pyelonephritis, endometriosis, bowel obstruction, colitis, renal colic, gastroenteritis, hernia, fibroids, endometriosis, etc. I personally reviewed patient's records and note hospitalization for COPD on 03/05/2023.  Patient's presentation is most consistent with acute complicated illness / injury requiring diagnostic workup.  The patient is on the cardiac monitor to evaluate for evidence of arrhythmia and/or significant heart rate changes.  Laboratory results demonstrate normal WBC 8.6, normal renal function BUN 10/creatinine 0.58.  Will obtain bladder scan, CT renal colic study.  Administer low-dose IV Dilaudid for pain.  With IV Zofran to prevent nausea.  Will reassess.  Clinical Course as of 12/08/23 0547  Tue Dec 08, 2023  0035 Bladder scan 132 mL in triage [JS]  0132 Offered Foley catheter which patient accepted.  UA negative for infection.  CT renal stone study demonstrates no acute abnormalities.  I personally viewed scout film and note moderate stool burden.  Will place on lactulose and patient will follow-up with urology outpatient.  She declines to go  home with Foley catheter in place.  We discussed this along with her husband and they are aware patient may need to return to the ED for recurrent urinary retention.  She verbalizes this and still desires to have Foley removed prior to discharge.  Strict return precautions given.  Patient and spouse verbalized understanding and agree with plan of care. [JS]    Clinical Course User Index [JS] Irean Hong, MD     FINAL CLINICAL IMPRESSION(S) / ED DIAGNOSES   Final diagnoses:  Flank pain  Right lower quadrant abdominal pain  Urinary retention  Constipation, unspecified constipation type     Rx / DC Orders   ED Discharge Orders          Ordered    lactulose (CHRONULAC) 10 GM/15ML solution  Daily PRN        12/08/23 0214             Note:  This document was prepared using Dragon voice recognition software and may include unintentional dictation errors.   Irean Hong, MD 12/08/23 [phone]

## 2023-12-07 NOTE — ED Triage Notes (Signed)
Pt reports lower abd pain and groin pain, pt states she only has 1 kidney and it has been 8 hours since she urinated.

## 2023-12-07 NOTE — ED Notes (Signed)
Bladder Scan shows urine

## 2023-12-08 ENCOUNTER — Emergency Department

## 2023-12-08 LAB — URINALYSIS, ROUTINE W REFLEX MICROSCOPIC
Bilirubin Urine: NEGATIVE
Glucose, UA: NEGATIVE mg/dL
Hgb urine dipstick: NEGATIVE
Ketones, ur: NEGATIVE mg/dL
Leukocytes,Ua: NEGATIVE
Nitrite: NEGATIVE
Protein, ur: 30 mg/dL — AB
Specific Gravity, Urine: 1.02 (ref 1.005–1.030)
Squamous Epithelial / HPF: 0 /[HPF] (ref 0–5)
pH: 5 (ref 5.0–8.0)

## 2023-12-08 MED ORDER — ONDANSETRON HCL 4 MG/2ML IJ SOLN
4.0000 mg | Freq: Once | INTRAMUSCULAR | Status: AC
  Administered 2023-12-08: 4 mg via INTRAVENOUS
  Filled 2023-12-08: qty 2

## 2023-12-08 MED ORDER — LACTULOSE 10 GM/15ML PO SOLN: 20.0000 g | Freq: Every day | ORAL | 0 refills | Status: AC | PRN

## 2023-12-08 MED ORDER — HYDROMORPHONE HCL 1 MG/ML IJ SOLN
0.5000 mg | Freq: Once | INTRAMUSCULAR | Status: AC
  Administered 2023-12-08: 0.5 mg via INTRAVENOUS
  Filled 2023-12-08: qty 0.5

## 2023-12-08 MED ORDER — HYDROXYZINE HCL 25 MG PO TABS
25.0000 mg | ORAL_TABLET | Freq: Once | ORAL | Status: AC
  Administered 2023-12-08: 25 mg via ORAL
  Filled 2023-12-08: qty 1

## 2023-12-08 MED ORDER — SODIUM CHLORIDE 0.9 % IV BOLUS
500.0000 mL | Freq: Once | INTRAVENOUS | Status: AC
  Administered 2023-12-08: 500 mL via INTRAVENOUS

## 2023-12-08 NOTE — ED Notes (Signed)
CCMD called to notify placing pt on tele

## 2023-12-08 NOTE — Discharge Instructions (Signed)
Take Lactulose as needed for bowel movements.  Drink plenty of fluids daily.  Return to the ER for worsening symptoms, persistent vomiting, unable to urinate or other concerns.

## 2023-12-08 NOTE — ED Notes (Signed)
Pt used restroom, with urgency, upon discharge

## 2023-12-19 ENCOUNTER — Telehealth: Admitting: Nurse Practitioner

## 2023-12-19 DIAGNOSIS — F419 Anxiety disorder, unspecified: Secondary | ICD-10-CM | POA: Diagnosis not present

## 2023-12-19 DIAGNOSIS — K529 Noninfective gastroenteritis and colitis, unspecified: Secondary | ICD-10-CM | POA: Diagnosis not present

## 2023-12-19 MED ORDER — BUSPIRONE HCL 10 MG PO TABS: 10.0000 mg | ORAL_TABLET | Freq: Two times a day (BID) | ORAL | 0 refills | Status: AC

## 2023-12-19 NOTE — Progress Notes (Signed)
 Virtual Visit Consent   [name], you are scheduled for a virtual visit with a [facility] provider today. Just as with appointments in the office, your consent must be obtained to participate. Your consent will be active for this visit and any virtual visit you may have with one of our providers in the next 365 days. If you have a MyChart account, a copy of this consent can be sent to you electronically.  As this is a virtual visit, video technology does not allow for your provider to perform a traditional examination. This may limit your provider's ability to fully assess your condition. If your provider identifies any concerns that need to be evaluated in person or the need to arrange testing (such as labs, EKG, etc.), we will make arrangements to do so. Although advances in technology are sophisticated, we cannot ensure that it will always work on either your end or our end. If the connection with a video visit is poor, the visit may have to be switched to a telephone visit. With either a video or telephone visit, we are not always able to ensure that we have a secure connection.  By engaging in this virtual visit, you consent to the provision of healthcare and authorize for your insurance to be billed (if applicable) for the services provided during this visit. Depending on your insurance coverage, you may receive a charge related to this service.  I need to obtain your verbal consent now. Are you willing to proceed with your visit today? Eular Panek has provided verbal consent on 12/19/2023 for a virtual visit (video or telephone). Haze LELON Servant, NP  Date: 12/19/2023 5:37 PM  Virtual Visit via Video Note   I, Haze LELON Servant, connected with  Colleen [name]  ([id number], 1961-07-29) on 12/19/23 at  5:15 PM EST by a video-enabled telemedicine application and verified that I am speaking with the correct person using two identifiers.  Location: Patient: Virtual Visit Location Patient:  Home Provider: Virtual Visit Location Provider: Home Office   I discussed the limitations of evaluation and management by telemedicine and the availability of in person appointments. The patient expressed understanding and agreed to proceed.    History of Present Illness: Colleen [name] is a 63 y.o. who identifies as a female who was assigned female at birth, and is being seen today for anxiety and GI symptoms.  [name] is currently experiencing abdominal pain, cramping and diarrhea. She states the diarrhea is causing her anxiety to be increased. She is also experiencing sinus drainage and bilateral ear pressure. No dark stools, no N/V or fever noted.    Problems:  Patient Active Problem List   Diagnosis Date Noted   Notalgia 03/08/2023   Acute on chronic respiratory failure with hypoxia (HCC) 03/07/2023   COPD with acute exacerbation (HCC) 03/07/2023   Acute exacerbation of chronic obstructive pulmonary disease (COPD) (HCC) 03/06/2023   Thoracic facet joint syndrome 09/11/2022   Thoracic degenerative disc disease 09/11/2022   Chest pain 07/16/2022   Protein-calorie malnutrition, moderate (HCC) 06/21/2022   Chronic diarrhea of unknown origin    Loss of weight    Gastric erosion    Dysphagia 06/20/2022   Right buttock pain 06/19/2022   Right-sided chest pain 06/18/2022   Coronary artery disease 03/10/2022   Dependence on nocturnal oxygen therapy 03/10/2022   At risk for prolonged QT interval syndrome 11/19/2021   Tobacco use disorder 06/19/2021   PTSD (post-traumatic stress disorder) 03/04/2021   Myofascial pain syndrome  07/09/2020   Chronic pain syndrome 06/04/2020   Osteopenia of multiple sites 06/04/2020   Age related osteoporosis 06/04/2020   Low back pain 04/27/2020   Panic attack 03/20/2020   COPD (chronic obstructive pulmonary disease) (HCC) 10/11/2018   Generalized anxiety disorder 10/11/2018    Allergies:  Allergies  Allergen Reactions   Desvenlafaxine  Anaphylaxis   Morphine And Codeine Anaphylaxis   Penicillins Anaphylaxis    Has patient had a PCN reaction causing immediate rash, facial/tongue/throat swelling, SOB or lightheadedness with hypotension: Yes Has patient had a PCN reaction causing severe rash involving mucus membranes or skin necrosis: No Has patient had a PCN reaction that required hospitalization: Unknown Has patient had a PCN reaction occurring within the last 10 years: No If all of the above answers are NO, then may proceed with Cephalosporin use.    Prazosin Other (See Comments)   Tamoxifen Anaphylaxis   Trazodone  Anaphylaxis   Clonazepam     Codeine Swelling   Duloxetine  Itching   Gabapentin  Hives    Rash and swelling.    Lorazepam      Patient feels this medications makes her to sedated and wants to avoid   Paroxetine Hcl Hives   Sulfa Antibiotics Itching   Cephalexin Rash   Medications:  Current Outpatient Medications:    busPIRone  (BUSPAR ) 10 MG tablet, Take 1 tablet (10 mg total) by mouth 2 (two) times daily., Disp: 60 tablet, Rfl: 0   albuterol  (VENTOLIN  HFA) 108 (90 Base) MCG/ACT inhaler, Inhale 2 puffs into the lungs every 6 (six) hours as needed for wheezing or shortness of breath., Disp: 8 g, Rfl: 0   aspirin  EC 81 MG tablet, Take 1 tablet (81 mg total) by mouth daily. Swallow whole., Disp: 30 tablet, Rfl: 12   benzonatate  (TESSALON ) 200 MG capsule, Take 1 capsule (200 mg total) by mouth 3 (three) times daily as needed for cough., Disp: 20 capsule, Rfl: 0   carvedilol  (COREG ) 3.125 MG tablet, Take 1 tablet (3.125 mg total) by mouth 2 (two) times daily with a meal., Disp: 60 tablet, Rfl: 0   cholecalciferol  (VITAMIN D3) 25 MCG (1000 UNIT) tablet, Take 1,000 Units by mouth daily., Disp: , Rfl:    denosumab (PROLIA) 60 MG/ML SOSY injection, Inject 60 mg into the skin every 6 (six) months., Disp: , Rfl:    EPINEPHrine  0.3 mg/0.3 mL IJ SOAJ injection, Inject 0.3 mg into the muscle as needed for anaphylaxis.,  Disp: , Rfl:    fluticasone  (FLONASE ) 50 MCG/ACT nasal spray, Place 1 spray into both nostrils 2 (two) times daily., Disp: , Rfl:    Fluticasone -Umeclidin-Vilant (TRELEGY ELLIPTA) 100-62.5-25 MCG/INH AEPB, Inhale 1 puff into the lungs daily., Disp: , Rfl:    hydrOXYzine  (ATARAX ) 50 MG tablet, Take 1 tablet (50 mg total) by mouth 3 (three) times daily as needed., Disp: 30 tablet, Rfl: 0   ipratropium (ATROVENT ) 0.02 % nebulizer solution, Take 0.5 mg by nebulization 4 (four) times daily., Disp: , Rfl:    lactulose  (CHRONULAC ) 10 GM/15ML solution, Take 30 mLs (20 g total) by mouth daily as needed for mild constipation., Disp: 120 mL, Rfl: 0   methocarbamol  (ROBAXIN ) 750 MG tablet, Take 1 tablet (750 mg total) by mouth 4 (four) times daily., Disp: 120 tablet, Rfl: 0   montelukast  (SINGULAIR ) 10 MG tablet, Take 10 mg by mouth daily., Disp: , Rfl:    pantoprazole  (PROTONIX ) 40 MG tablet, Take 1 tablet (40 mg total) by mouth 2 (two) times daily before a meal.,  Disp: 60 tablet, Rfl: 0   vitamin B-12 (CYANOCOBALAMIN ) 500 MCG tablet, Take 500 mcg by mouth daily., Disp: , Rfl:   Observations/Objective: Patient is well-developed, well-nourished in no acute distress.  Resting comfortably at home.  Head is normocephalic, atraumatic.  No labored breathing.  Speech is clear and coherent with logical content.  Patient is alert and oriented at baseline.    Assessment and Plan: 1. Anxiety (Primary) - busPIRone  (BUSPAR ) 10 MG tablet; Take 1 tablet (10 mg total) by mouth 2 (two) times daily.  Dispense: 60 tablet; Refill: 0  2. Gastroenteritis Conservative measures BRAT diet.  Stay hydrated  Follow Up Instructions: I discussed the assessment and treatment plan with the patient. The patient was provided an opportunity to ask questions and all were answered. The patient agreed with the plan and demonstrated an understanding of the instructions.  A copy of instructions were sent to the patient via MyChart  unless otherwise noted below.    The patient was advised to call back or seek an in-person evaluation if the symptoms worsen or if the condition fails to improve as anticipated.    Avarie Tavano W Nidal Rivet, NP

## 2023-12-19 NOTE — Patient Instructions (Signed)
 [name], thank you for joining Haze LELON Servant, NP for today's virtual visit.  While this provider is not your primary care provider (PCP), if your PCP is located in our provider database this encounter information will be shared with them immediately following your visit.   A [facility] MyChart account gives you access to today's visit and all your visits, tests, and labs performed at [facility]  click here if you don't have a [facility] MyChart account or go to [email or web address]  Consent: (Patient) Colleen [name] provided verbal consent for this virtual visit at the beginning of the encounter.  Current Medications:  Current Outpatient Medications:    busPIRone  (BUSPAR ) 10 MG tablet, Take 1 tablet (10 mg total) by mouth 2 (two) times daily., Disp: 60 tablet, Rfl: 0   albuterol  (VENTOLIN  HFA) 108 (90 Base) MCG/ACT inhaler, Inhale 2 puffs into the lungs every 6 (six) hours as needed for wheezing or shortness of breath., Disp: 8 g, Rfl: 0   aspirin  EC 81 MG tablet, Take 1 tablet (81 mg total) by mouth daily. Swallow whole., Disp: 30 tablet, Rfl: 12   benzonatate  (TESSALON ) 200 MG capsule, Take 1 capsule (200 mg total) by mouth 3 (three) times daily as needed for cough., Disp: 20 capsule, Rfl: 0   carvedilol  (COREG ) 3.125 MG tablet, Take 1 tablet (3.125 mg total) by mouth 2 (two) times daily with a meal., Disp: 60 tablet, Rfl: 0   cholecalciferol  (VITAMIN D3) 25 MCG (1000 UNIT) tablet, Take 1,000 Units by mouth daily., Disp: , Rfl:    denosumab (PROLIA) 60 MG/ML SOSY injection, Inject 60 mg into the skin every 6 (six) months., Disp: , Rfl:    EPINEPHrine  0.3 mg/0.3 mL IJ SOAJ injection, Inject 0.3 mg into the muscle as needed for anaphylaxis., Disp: , Rfl:    fluticasone  (FLONASE ) 50 MCG/ACT nasal spray, Place 1 spray into both nostrils 2 (two) times daily., Disp: , Rfl:    Fluticasone -Umeclidin-Vilant (TRELEGY ELLIPTA) 100-62.5-25 MCG/INH AEPB, Inhale 1 puff into the  lungs daily., Disp: , Rfl:    hydrOXYzine  (ATARAX ) 50 MG tablet, Take 1 tablet (50 mg total) by mouth 3 (three) times daily as needed., Disp: 30 tablet, Rfl: 0   ipratropium (ATROVENT ) 0.02 % nebulizer solution, Take 0.5 mg by nebulization 4 (four) times daily., Disp: , Rfl:    lactulose  (CHRONULAC ) 10 GM/15ML solution, Take 30 mLs (20 g total) by mouth daily as needed for mild constipation., Disp: 120 mL, Rfl: 0   methocarbamol  (ROBAXIN ) 750 MG tablet, Take 1 tablet (750 mg total) by mouth 4 (four) times daily., Disp: 120 tablet, Rfl: 0   montelukast  (SINGULAIR ) 10 MG tablet, Take 10 mg by mouth daily., Disp: , Rfl:    pantoprazole  (PROTONIX ) 40 MG tablet, Take 1 tablet (40 mg total) by mouth 2 (two) times daily before a meal., Disp: 60 tablet, Rfl: 0   vitamin B-12 (CYANOCOBALAMIN ) 500 MCG tablet, Take 500 mcg by mouth daily., Disp: , Rfl:    Medications ordered in this encounter:  Meds ordered this encounter  Medications   busPIRone  (BUSPAR ) 10 MG tablet    Sig: Take 1 tablet (10 mg total) by mouth 2 (two) times daily.    Dispense:  60 tablet    Refill:  0    Supervising Provider:   BLAISE ALEENE KIDD [[id number]]     *If you need refills on other medications prior to your next appointment, please contact your pharmacy*  Follow-Up: Call back or seek  an in-person evaluation if the symptoms worsen or if the condition fails to improve as anticipated.  [facility] [phone]  Other Instructions 1. Anxiety (Primary) - busPIRone  (BUSPAR ) 10 MG tablet; Take 1 tablet (10 mg total) by mouth 2 (two) times daily.  Dispense: 60 tablet; Refill: 0  2. Gastroenteritis Conservative measures BRAT diet.  Stay hydrated   If you have been instructed to have an in-person evaluation today at a local Urgent Care facility, please use the link below. It will take you to a list of all of our available [facility], including address, phone number and hours of operation. Please  do not delay care.  [facility]  If you or a family member do not have a primary care provider, use the link below to schedule a visit and establish care. When you choose a [facility] primary care physician or advanced practice provider, you gain a long-term partner in health. Find a Primary Care Provider  Learn more about [facility] in-office and virtual care options: [facility] - Get Care Now

## 2024-01-13 ENCOUNTER — Other Ambulatory Visit: Payer: Self-pay

## 2024-01-13 ENCOUNTER — Emergency Department
Admission: EM | Admit: 2024-01-13 | Discharge: 2024-01-13 | Disposition: A | Discharge status: Home / Self Care | Attending: Emergency Medicine | Admitting: Emergency Medicine

## 2024-01-13 DIAGNOSIS — M542 Cervicalgia: Secondary | ICD-10-CM | POA: Insufficient documentation

## 2024-01-13 DIAGNOSIS — M545 Low back pain, unspecified: Secondary | ICD-10-CM | POA: Insufficient documentation

## 2024-01-13 DIAGNOSIS — J449 Chronic obstructive pulmonary disease, unspecified: Secondary | ICD-10-CM | POA: Insufficient documentation

## 2024-01-13 DIAGNOSIS — M549 Dorsalgia, unspecified: Secondary | ICD-10-CM | POA: Diagnosis present

## 2024-01-13 DIAGNOSIS — M791 Myalgia, unspecified site: Secondary | ICD-10-CM | POA: Insufficient documentation

## 2024-01-13 DIAGNOSIS — G8929 Other chronic pain: Secondary | ICD-10-CM | POA: Diagnosis not present

## 2024-01-13 DIAGNOSIS — M546 Pain in thoracic spine: Secondary | ICD-10-CM | POA: Insufficient documentation

## 2024-01-13 LAB — RESP PANEL BY RT-PCR (RSV, FLU A&B, COVID)  RVPGX2
Influenza A by PCR: NEGATIVE
Influenza B by PCR: NEGATIVE
Resp Syncytial Virus by PCR: NEGATIVE
SARS Coronavirus 2 by RT PCR: NEGATIVE

## 2024-01-13 MED ORDER — GABAPENTIN 300 MG PO CAPS
300.0000 mg | ORAL_CAPSULE | Freq: Once | ORAL | Status: AC
  Administered 2024-01-13: 300 mg via ORAL
  Filled 2024-01-13: qty 1

## 2024-01-13 MED ORDER — FENTANYL CITRATE PF 50 MCG/ML IJ SOSY
50.0000 ug | PREFILLED_SYRINGE | Freq: Once | INTRAMUSCULAR | Status: AC
  Administered 2024-01-13: 50 ug via INTRAVENOUS
  Filled 2024-01-13: qty 1

## 2024-01-13 MED ORDER — FENTANYL CITRATE PF 50 MCG/ML IJ SOSY
50.0000 ug | PREFILLED_SYRINGE | Freq: Once | INTRAMUSCULAR | Status: AC
  Administered 2024-01-13: 50 ug via INTRAMUSCULAR
  Filled 2024-01-13: qty 1

## 2024-01-13 MED ORDER — DEXAMETHASONE SODIUM PHOSPHATE 10 MG/ML IJ SOLN
10.0000 mg | Freq: Once | INTRAMUSCULAR | Status: AC
  Administered 2024-01-13: 10 mg via INTRAVENOUS
  Filled 2024-01-13: qty 1

## 2024-01-13 MED ORDER — METHOCARBAMOL 1000 MG/10ML IJ SOLN
500.0000 mg | Freq: Once | INTRAMUSCULAR | Status: AC
  Administered 2024-01-13: 500 mg via INTRAVENOUS
  Filled 2024-01-13: qty 5

## 2024-01-13 MED ORDER — HYDROXYZINE HCL 25 MG PO TABS
50.0000 mg | ORAL_TABLET | Freq: Once | ORAL | Status: AC
  Administered 2024-01-13: 50 mg via ORAL
  Filled 2024-01-13: qty 2

## 2024-01-13 NOTE — ED Provider Notes (Signed)
 [facility] Emergency Department Provider Note     Event Date/Time   First MD Initiated Contact with Patient 01/13/24 1608     (approximate)   History   Back Pain   HPI  Colleen [name] is a 63 y.o. female with a history of COPD, anxiety, PTSD, and chronic pain under pain management, presents to the ED via EMS.  Patient presents noting that her chronic pain medicines are not helping with her chronic pain.  She denies any recent injury, trauma, or fall.  She denies any increased activity to trigger her pain response at this time.  No bladder or bowel incontinence, foot drop, or saddle anesthesia.  She has received IV meds and route including fentanyl and Zofran from the EMS staff.  Patient wears 2 L of O2 chronically.  She denies any chest pain or shortness of breath.  By the patient's report, she describes the pain as generalized muscle pain intermittently sharp from the back of the head to the tailbone.  Home medications include Belbuca twice daily, gabapentin 300 mg twice daily, and Atarax.    Physical Exam   Triage Vital Signs: ED Triage Vitals  Encounter Vitals Group     BP 01/13/24 1603 (!) 130/98     Systolic BP Percentile --      Diastolic BP Percentile --      Pulse Rate 01/13/24 1603 (!) 101     Resp 01/13/24 1603 19     Temp 01/13/24 1603 98 F (36.7 C)     Temp Source 01/13/24 1603 Oral     SpO2 01/13/24 1603 92 %     Weight 01/13/24 1602 95 lb (43.1 kg)     Height --      Head Circumference --      Peak Flow --      Pain Score 01/13/24 1602 10     Pain Loc --      Pain Education --      Exclude from Growth Chart --     Most recent vital signs: Vitals:   01/13/24 1603 01/13/24 2135  BP: (!) 130/98 118/84  Pulse: (!) 101 80  Resp: 19 18  Temp: 98 F (36.7 C)   SpO2: 92% 94%    General Awake, no distress. NAD HEENT NCAT. PERRL. EOMI. No rhinorrhea. Mucous membranes are moist.  CV:  Good peripheral perfusion. RRR RESP:  Normal  effort. CTA ABD:  No distention.  MSK:  Normal spinal alignment without midline tenderness, spasm, vomiting, or step-off.  General musculoskeletal tenderness over along palpation of the cervical thoracic, and lumbar spines.  Normal flexion extension range of the lower extremities. NEURO: Cranial nerves II to XII grossly intact.  Normal LE DTRs.  Normal gait without ataxia.   ED Results / Procedures / Treatments   Labs (all labs ordered are listed, but only abnormal results are displayed) Labs Reviewed  RESP PANEL BY RT-PCR (RSV, FLU A&B, COVID)  RVPGX2     EKG   RADIOLOGY  No results found.   PROCEDURES:  Critical Care performed: No  Procedures   MEDICATIONS ORDERED IN ED: Medications  fentaNYL (SUBLIMAZE) injection 50 mcg (50 mcg Intravenous Given 01/13/24 1825)  dexamethasone (DECADRON) injection 10 mg (10 mg Intravenous Given 01/13/24 1914)  gabapentin (NEURONTIN) capsule 300 mg (300 mg Oral Given 01/13/24 2010)  hydrOXYzine (ATARAX) tablet 50 mg (50 mg Oral Given 01/13/24 2010)  methocarbamol (ROBAXIN) injection 500 mg (500 mg Intravenous Given 01/13/24  2011)  fentaNYL (SUBLIMAZE) injection 50 mcg (50 mcg Intramuscular Given 01/13/24 2148)  gabapentin (NEURONTIN) capsule 300 mg (300 mg Oral Given 01/13/24 2148)     IMPRESSION / MDM / ASSESSMENT AND PLAN / ED COURSE  I reviewed the triage vital signs and the nursing notes.                              Differential diagnosis includes, but is not limited to, acute exacerbation of chronic pain, radicular pain, myalgia, muscle spasm, DDD  Patient's presentation is most consistent with acute, uncomplicated illness.  Patient's diagnosis is consistent with intractable acute on chronic pain.  Patient presents with generalized myalgias from the cervical spine to the lumbosacral junction.  She would endorse onset of symptomatic increase of her chronic pain yesterday.  She found no relief in her regular home medications and her  nonmedical interventions including walking, massage, ice/heat therapy, and trigger point manipulation by her husband.  She denied any fall, injury, trauma, denied any acute neuromuscular deficits.  Patient continues to endorse pain at a 8 out of 10 despite multiple interventions.  She would also not at one point note that she would be content if her pain was down to an 8.  We did discuss the option of potential admission for intractable pain, but ultimately patient declined admission at this time.  Patient will be discharged home with instructions to take her home meds. Patient is to follow up with her PCP or pain management specialist as discussed, as needed or otherwise directed. Patient is given ED precautions to return to the ED for any worsening or new symptoms.  FINAL CLINICAL IMPRESSION(S) / ED DIAGNOSES   Final diagnoses:  Acute on chronic back pain  Myalgia     Rx / DC Orders   ED Discharge Orders     None        Note:  This document was prepared using Dragon voice recognition software and may include unintentional dictation errors.    Lissa Hoard, PA-C 01/13/24 2333    Merwyn Katos, MD 01/19/24 [phone]

## 2024-01-13 NOTE — ED Notes (Signed)
 Oxygen tank switched out to a new one. Pt resting comfortably with eyes closed. NAD. Visitor at bedside.

## 2024-01-13 NOTE — ED Notes (Signed)
 See triage notes. Patient c/o severe back pain. Patient has a history of chronic pain.

## 2024-01-13 NOTE — ED Triage Notes (Signed)
 Pt BIB EMS with c/o back pain. Pt has chronic back that has worsen over the past day. Per pt, her chronic pain meds aren't working. Pt received of fentanyl and 4mg  of zofran enroute.   20G R Hand

## 2024-01-13 NOTE — Discharge Instructions (Addendum)
 Your exam is overall reassuring.  We have given IV, IM, and oral medications to help control your acute pain.  You should follow-up with your primary provider ongoing evaluation.  Return to the ED if needed.

## 2024-01-13 NOTE — ED Triage Notes (Addendum)
 First nurse note: pt to ED [facility] from home for chronic back pain. Pt is seen at pain management. Has taken prescribed pain meds this am, continuing to have pain. HTN, tachy with EMS.  20g to Rhand. 50 fentanyl, 4 zofran PTA. Reports improvement at this time. No recent injuries.  Wears 2 L [place] chronic

## 2024-01-29 ENCOUNTER — Emergency Department

## 2024-01-29 ENCOUNTER — Other Ambulatory Visit: Payer: Self-pay

## 2024-01-29 ENCOUNTER — Emergency Department
Admission: EM | Admit: 2024-01-29 | Discharge: 2024-01-29 | Disposition: A | Discharge status: Home / Self Care | Attending: Emergency Medicine | Admitting: Emergency Medicine

## 2024-01-29 DIAGNOSIS — K1370 Unspecified lesions of oral mucosa: Secondary | ICD-10-CM

## 2024-01-29 DIAGNOSIS — F419 Anxiety disorder, unspecified: Secondary | ICD-10-CM

## 2024-01-29 DIAGNOSIS — I2510 Atherosclerotic heart disease of native coronary artery without angina pectoris: Secondary | ICD-10-CM | POA: Insufficient documentation

## 2024-01-29 DIAGNOSIS — J449 Chronic obstructive pulmonary disease, unspecified: Secondary | ICD-10-CM | POA: Insufficient documentation

## 2024-01-29 DIAGNOSIS — R0602 Shortness of breath: Secondary | ICD-10-CM | POA: Diagnosis present

## 2024-01-29 LAB — CBC WITH DIFFERENTIAL/PLATELET
Abs Immature Granulocytes: 0.02 10*3/uL (ref 0.00–0.07)
Basophils Absolute: 0.1 10*3/uL (ref 0.0–0.1)
Basophils Relative: 1 %
Eosinophils Absolute: 0.1 10*3/uL (ref 0.0–0.5)
Eosinophils Relative: 1 %
HCT: 35.7 % — ABNORMAL LOW (ref 36.0–46.0)
Hemoglobin: 11.8 g/dL — ABNORMAL LOW (ref 12.0–15.0)
Immature Granulocytes: 0 %
Lymphocytes Relative: 15 %
Lymphs Abs: 1.2 10*3/uL (ref 0.7–4.0)
MCH: 28.8 pg (ref 26.0–34.0)
MCHC: 33.1 g/dL (ref 30.0–36.0)
MCV: 87.1 fL (ref 80.0–100.0)
Monocytes Absolute: 0.6 10*3/uL (ref 0.1–1.0)
Monocytes Relative: 8 %
Neutro Abs: 5.9 10*3/uL (ref 1.7–7.7)
Neutrophils Relative %: 75 %
Platelets: 225 10*3/uL (ref 150–400)
RBC: 4.1 MIL/uL (ref 3.87–5.11)
RDW: 14.6 % (ref 11.5–15.5)
WBC: 7.8 10*3/uL (ref 4.0–10.5)
nRBC: 0 % (ref 0.0–0.2)

## 2024-01-29 LAB — COMPREHENSIVE METABOLIC PANEL
ALT: 12 U/L (ref 0–44)
AST: 16 U/L (ref 15–41)
Albumin: 4.1 g/dL (ref 3.5–5.0)
Alkaline Phosphatase: 37 U/L — ABNORMAL LOW (ref 38–126)
Anion gap: 7 (ref 5–15)
BUN: 8 mg/dL (ref 8–23)
CO2: 29 mmol/L (ref 22–32)
Calcium: 8.9 mg/dL (ref 8.9–10.3)
Chloride: 92 mmol/L — ABNORMAL LOW (ref 98–111)
Creatinine, Ser: 0.35 mg/dL — ABNORMAL LOW (ref 0.44–1.00)
GFR, Estimated: >60 mL/min (ref >60)
Glucose, Bld: 116 mg/dL — ABNORMAL HIGH (ref 70–99)
Potassium: 4.4 mmol/L (ref 3.5–5.1)
Sodium: 128 mmol/L — ABNORMAL LOW (ref 135–145)
Total Bilirubin: 0.6 mg/dL (ref 0.0–1.2)
Total Protein: 6.7 g/dL (ref 6.5–8.1)

## 2024-01-29 LAB — LIPASE, BLOOD: Lipase: 24 U/L (ref 11–51)

## 2024-01-29 LAB — RESP PANEL BY RT-PCR (RSV, FLU A&B, COVID)  RVPGX2
Influenza A by PCR: NEGATIVE
Influenza B by PCR: NEGATIVE
Resp Syncytial Virus by PCR: NEGATIVE
SARS Coronavirus 2 by RT PCR: NEGATIVE

## 2024-01-29 LAB — TROPONIN I (HIGH SENSITIVITY): Troponin I (High Sensitivity): 6 ng/L (ref <18)

## 2024-01-29 MED ORDER — ONDANSETRON 4 MG PO TBDP: 4.0000 mg | ORAL_TABLET | Freq: Three times a day (TID) | ORAL | 0 refills | Status: AC | PRN

## 2024-01-29 MED ORDER — HYDROXYZINE HCL 25 MG PO TABS
25.0000 mg | ORAL_TABLET | Freq: Once | ORAL | Status: AC
  Administered 2024-01-29: 25 mg via ORAL
  Filled 2024-01-29: qty 1

## 2024-01-29 MED ORDER — LORAZEPAM 1 MG PO TABS
1.0000 mg | ORAL_TABLET | Freq: Once | ORAL | Status: AC
  Administered 2024-01-29: 1 mg via ORAL
  Filled 2024-01-29: qty 1

## 2024-01-29 MED ORDER — IPRATROPIUM-ALBUTEROL 0.5-2.5 (3) MG/3ML IN SOLN
3.0000 mL | Freq: Once | RESPIRATORY_TRACT | Status: AC
  Administered 2024-01-29: 3 mL via RESPIRATORY_TRACT
  Filled 2024-01-29: qty 3

## 2024-01-29 MED ORDER — ONDANSETRON HCL 4 MG/2ML IJ SOLN
4.0000 mg | Freq: Once | INTRAMUSCULAR | Status: AC
  Administered 2024-01-29: 4 mg via INTRAVENOUS
  Filled 2024-01-29: qty 2

## 2024-01-29 NOTE — ED Triage Notes (Signed)
 Pt reports history of COPD, feeling shortness of breath for the past 2 days, reports today breathing has been hard. Pt at baseline on 2-3 L/Britt. Pt reports feeling nausea, denies any vomiting. Pt talks in small short sentences.

## 2024-01-29 NOTE — ED Provider Notes (Signed)
 [facility] Provider Note    Event Date/Time   First MD Initiated Contact with Patient 01/29/24 2047     (approximate)   History   Chief Complaint Shortness of Breath   HPI  Colleen [name] is a 63 y.o. female with past medical history of COPD, chronic approxirespiratory failure on 2 L nasal cannula, CAD, chronic pain syndrome, and anxiety who presents to the ED complaining of shortness of breath.  Patient reports that she has been feeling increasingly short of breath with discomfort in her chest over the past 24 hours.  She denies any associated fevers or cough, does endorse some postnasal drip.  She also reports feeling nauseous but denies any vomiting, diarrhea, abdominal pain, or dysuria.  She is not aware of any fevers and denies any sick contacts.  She additionally reports that she has been dealing with a "hole" in the roof of her mouth.  She denies any drainage from this area, states she has been scheduled to follow-up with oral surgery for this in 10 days.     Physical Exam   Triage Vital Signs: ED Triage Vitals  Encounter Vitals Group     BP 01/29/24 2035 (!) 158/101     Systolic BP Percentile --      Diastolic BP Percentile --      Pulse Rate 01/29/24 2035 96     Resp 01/29/24 2035 (!) 22     Temp 01/29/24 2035 98 F (36.7 C)     Temp Source 01/29/24 2035 Oral     SpO2 01/29/24 2035 100 %     Weight 01/29/24 2036 98 lb (44.5 kg)     Height 01/29/24 2036 5' (1.524 m)     Head Circumference --      Peak Flow --      Pain Score 01/29/24 2035 10     Pain Loc --      Pain Education --      Exclude from Growth Chart --     Most recent vital signs: Vitals:   01/29/24 2139 01/29/24 2230  BP:  (!) 145/95  Pulse:  91  Resp:  17  Temp:    SpO2: 94% 94%    Constitutional: Alert and oriented. Eyes: Conjunctivae are normal. Head: Atraumatic. Nose: No congestion/rhinnorhea. Mouth/Throat: Mucous membranes are moist.  Small opening in the  roof of the mouth with no bleeding, purulence, erythema, or edema. Cardiovascular: Normal rate, regular rhythm. Grossly normal heart sounds.  2+ radial pulses bilaterally. Respiratory: Normal respiratory effort.  No retractions. Lungs with faint end expiratory wheezing. Gastrointestinal: Soft and nontender. No distention. Musculoskeletal: No lower extremity tenderness nor edema.  Neurologic:  Normal speech and language. No gross focal neurologic deficits are appreciated.    ED Results / Procedures / Treatments   Labs (all labs ordered are listed, but only abnormal results are displayed) Labs Reviewed  CBC WITH DIFFERENTIAL/PLATELET - Abnormal; Notable for the following components:      Result Value   Hemoglobin 11.8 (*)    HCT 35.7 (*)    All other components within normal limits  COMPREHENSIVE METABOLIC PANEL - Abnormal; Notable for the following components:   Sodium 128 (*)    Chloride 92 (*)    Glucose, Bld 116 (*)    Creatinine, Ser 0.35 (*)    Alkaline Phosphatase 37 (*)    All other components within normal limits  RESP PANEL BY RT-PCR (RSV, FLU A&B, COVID)  RVPGX2  LIPASE, BLOOD  TROPONIN I (HIGH SENSITIVITY)     EKG  ED ECG REPORT I, Chesley Noon, the attending physician, personally viewed and interpreted this ECG.   Date: 01/29/2024  EKG Time: 20:39  Rate: 94  Rhythm: normal sinus rhythm  Axis: RAD  Intervals:none  ST&T Change: None  RADIOLOGY Chest x-ray reviewed and interpreted by me with no infiltrate, edema, or effusion.  PROCEDURES:  Critical Care performed: No  Procedures   MEDICATIONS ORDERED IN ED: Medications  LORazepam (ATIVAN) tablet 1 mg (has no administration in time range)  ipratropium-albuterol (DUONEB) 0.5-2.5 (3) MG/3ML nebulizer solution 3 mL (3 mLs Nebulization Given 01/29/24 2118)  ondansetron (ZOFRAN) injection 4 mg (4 mg Intravenous Given 01/29/24 2118)  hydrOXYzine (ATARAX) tablet 25 mg (25 mg Oral Given 01/29/24 2122)      IMPRESSION / MDM / ASSESSMENT AND PLAN / ED COURSE  I reviewed the triage vital signs and the nursing notes.                              63 y.o. female with past medical history of COPD, chronic hypoxic respiratory failure on 2 L nasal cannula, CAD, chronic pain syndrome, and anxiety who presents to the ED with postnasal drip, shortness of breath, and nausea over the past 24 hours.  Patient's presentation is most consistent with acute presentation with potential threat to life or bodily function.  Differential diagnosis includes, but is not limited to, COPD exacerbation, pneumonia, pneumothorax, musculoskeletal pain, GERD, ACS, PE, anemia, electrolyte abnormality, AKI.  Patient nontoxic-appearing and in no acute distress, vital signs remarkable for mild tachypnea and hypertension but otherwise reassuring.  She is not in any respiratory distress and maintaining oxygen saturations at 100% on 2 L nasal cannula.  She does have some faint wheezing that we will treat with DuoNeb, EKG without evidence of arrhythmia or ischemia.  Labs including troponin are pending at this time, chest x-ray is also pending.  With her postnasal drip, suspect viral illness and viral panel pending at this time.  She endorses nausea but has a benign abdominal exam, will treat with IV Zofran.  Small opening in the roof of her mouth appears to be a chronic issue going on for multiple months, no acute concerns related to this and she has oral surgery follow-up scheduled.  Chest x-ray is unremarkable, labs with mild hyponatremia but no significant AKI, anemia, or leukocytosis.  Troponin within normal limits and I doubt ACS or PE.  COVID and flu testing is negative.  On reassessment, patient reports significant anxiety related to the hole in the roof of her mouth which seems to be exacerbating her chronic anxiety.  She was reassured that there are no findings concerning for acute infection or other concerning finding, has  follow-up scheduled with the appropriate specialist.  Patient currently breathing comfortably on her usual 2 L nasal cannula, will give dose of Ativan to help with her anxiety and she already takes Atarax at home.  She is appropriate for discharge home with outpatient follow-up, was counseled to return to the ED for new or worsening symptoms.  Patient and spouse agree with plan.      FINAL CLINICAL IMPRESSION(S) / ED DIAGNOSES   Final diagnoses:  Shortness of breath  Mouth lesion  Anxiety     Rx / DC Orders   ED Discharge Orders     None        Note:  This document was prepared using Dragon voice recognition software and may include unintentional dictation errors.   Chesley Noon, MD 01/29/24 2329
